# Patient Record
Sex: Female | Born: 1955
Health system: Southern US, Community
[De-identification: ages and names within clinical notes are randomized; demographics above are authoritative.]

## PROBLEM LIST (undated history)

## (undated) DIAGNOSIS — G4733 Obstructive sleep apnea (adult) (pediatric): Secondary | ICD-10-CM

## (undated) DIAGNOSIS — N183 Chronic kidney disease, stage 3 unspecified: Secondary | ICD-10-CM

## (undated) DIAGNOSIS — I509 Heart failure, unspecified: Secondary | ICD-10-CM

## (undated) DIAGNOSIS — I10 Essential (primary) hypertension: Secondary | ICD-10-CM

## (undated) DIAGNOSIS — R9389 Abnormal findings on diagnostic imaging of other specified body structures: Secondary | ICD-10-CM

## (undated) DIAGNOSIS — N289 Disorder of kidney and ureter, unspecified: Secondary | ICD-10-CM

## (undated) DIAGNOSIS — G030 Nonpyogenic meningitis: Secondary | ICD-10-CM

## (undated) DIAGNOSIS — Z992 Dependence on renal dialysis: Secondary | ICD-10-CM

## (undated) DIAGNOSIS — I2510 Atherosclerotic heart disease of native coronary artery without angina pectoris: Secondary | ICD-10-CM

## (undated) DIAGNOSIS — R601 Generalized edema: Secondary | ICD-10-CM

## (undated) DIAGNOSIS — I50810 Right heart failure, unspecified: Secondary | ICD-10-CM

## (undated) DIAGNOSIS — J449 Chronic obstructive pulmonary disease, unspecified: Secondary | ICD-10-CM

## (undated) DIAGNOSIS — R569 Unspecified convulsions: Secondary | ICD-10-CM

## (undated) DIAGNOSIS — E119 Type 2 diabetes mellitus without complications: Secondary | ICD-10-CM

## (undated) DIAGNOSIS — I272 Pulmonary hypertension, unspecified: Secondary | ICD-10-CM

## (undated) DIAGNOSIS — I639 Cerebral infarction, unspecified: Secondary | ICD-10-CM

## (undated) HISTORY — PX: TUBAL LIGATION: SHX77

## (undated) HISTORY — DX: Dependence on renal dialysis: Z99.2

## (undated) HISTORY — PX: ABDOMINAL HYSTERECTOMY: SHX81

## (undated) HISTORY — DX: Unspecified convulsions: R56.9

---

## 2012-04-05 ENCOUNTER — Emergency Department (HOSPITAL_COMMUNITY): Payer: Medicaid - Out of State

## 2012-04-05 ENCOUNTER — Encounter (HOSPITAL_COMMUNITY): Payer: Self-pay | Admitting: Emergency Medicine

## 2012-04-05 ENCOUNTER — Observation Stay (HOSPITAL_COMMUNITY)
Admission: EM | Admit: 2012-04-05 | Discharge: 2012-04-06 | Disposition: A | Discharge status: Home / Self Care | Payer: Medicaid - Out of State | Attending: Internal Medicine | Admitting: Internal Medicine

## 2012-04-05 DIAGNOSIS — R609 Edema, unspecified: Secondary | ICD-10-CM

## 2012-04-05 DIAGNOSIS — I2510 Atherosclerotic heart disease of native coronary artery without angina pectoris: Secondary | ICD-10-CM | POA: Insufficient documentation

## 2012-04-05 DIAGNOSIS — I509 Heart failure, unspecified: Secondary | ICD-10-CM

## 2012-04-05 DIAGNOSIS — D638 Anemia in other chronic diseases classified elsewhere: Secondary | ICD-10-CM

## 2012-04-05 DIAGNOSIS — D649 Anemia, unspecified: Secondary | ICD-10-CM

## 2012-04-05 DIAGNOSIS — Z7982 Long term (current) use of aspirin: Secondary | ICD-10-CM | POA: Insufficient documentation

## 2012-04-05 DIAGNOSIS — N179 Acute kidney failure, unspecified: Secondary | ICD-10-CM

## 2012-04-05 DIAGNOSIS — R0602 Shortness of breath: Secondary | ICD-10-CM

## 2012-04-05 DIAGNOSIS — I219 Acute myocardial infarction, unspecified: Secondary | ICD-10-CM | POA: Insufficient documentation

## 2012-04-05 DIAGNOSIS — R600 Localized edema: Secondary | ICD-10-CM

## 2012-04-05 DIAGNOSIS — Z794 Long term (current) use of insulin: Secondary | ICD-10-CM | POA: Insufficient documentation

## 2012-04-05 DIAGNOSIS — E119 Type 2 diabetes mellitus without complications: Secondary | ICD-10-CM

## 2012-04-05 DIAGNOSIS — I5023 Acute on chronic systolic (congestive) heart failure: Principal | ICD-10-CM | POA: Insufficient documentation

## 2012-04-05 DIAGNOSIS — Z79899 Other long term (current) drug therapy: Secondary | ICD-10-CM | POA: Insufficient documentation

## 2012-04-05 HISTORY — DX: Atherosclerotic heart disease of native coronary artery without angina pectoris: I25.10

## 2012-04-05 HISTORY — DX: Heart failure, unspecified: I50.9

## 2012-04-05 HISTORY — DX: Essential (primary) hypertension: I10

## 2012-04-05 HISTORY — DX: Chronic obstructive pulmonary disease, unspecified: J44.9

## 2012-04-05 LAB — TROPONIN I: Troponin I: <0.3 ng/mL (ref <0.30)

## 2012-04-05 LAB — GLUCOSE, CAPILLARY: Glucose-Capillary: 277 mg/dL — ABNORMAL HIGH (ref 70–99)

## 2012-04-05 LAB — COMPREHENSIVE METABOLIC PANEL
ALT: 13 U/L (ref 0–35)
Albumin: 2.9 g/dL — ABNORMAL LOW (ref 3.5–5.2)
Alkaline Phosphatase: 112 U/L (ref 39–117)
Glucose, Bld: 383 mg/dL — ABNORMAL HIGH (ref 70–99)
Potassium: 4.3 mEq/L (ref 3.5–5.1)
Sodium: 137 mEq/L (ref 135–145)
Total Protein: 6.6 g/dL (ref 6.0–8.3)

## 2012-04-05 LAB — CBC
Hemoglobin: 7.6 g/dL — ABNORMAL LOW (ref 12.0–15.0)
MCHC: 32.3 g/dL (ref 30.0–36.0)
RDW: 14.5 % (ref 11.5–15.5)
WBC: 5.9 10*3/uL (ref 4.0–10.5)

## 2012-04-05 LAB — PREPARE RBC (CROSSMATCH)

## 2012-04-05 LAB — PRO B NATRIURETIC PEPTIDE: Pro B Natriuretic peptide (BNP): 4578 pg/mL — ABNORMAL HIGH (ref 0–125)

## 2012-04-05 LAB — OCCULT BLOOD, POC DEVICE: Fecal Occult Bld: NEGATIVE

## 2012-04-05 MED ORDER — FERROUS FUMARATE 325 (106 FE) MG PO TABS
1.0000 | ORAL_TABLET | Freq: Every day | ORAL | Status: DC
  Administered 2012-04-05 – 2012-04-06 (×2): 106 mg via ORAL
  Filled 2012-04-05 (×2): qty 1

## 2012-04-05 MED ORDER — ISOSORBIDE MONONITRATE ER 60 MG PO TB24
120.0000 mg | ORAL_TABLET | Freq: Every day | ORAL | Status: DC
  Administered 2012-04-05 – 2012-04-06 (×2): 120 mg via ORAL
  Filled 2012-04-05 (×3): qty 2

## 2012-04-05 MED ORDER — ONDANSETRON HCL 4 MG PO TABS: 4.0000 mg | ORAL_TABLET | Freq: Four times a day (QID) | ORAL | Status: DC | PRN

## 2012-04-05 MED ORDER — ENOXAPARIN SODIUM 40 MG/0.4ML ~~LOC~~ SOLN
40.0000 mg | SUBCUTANEOUS | Status: DC
  Administered 2012-04-05: 40 mg via SUBCUTANEOUS
  Filled 2012-04-05 (×2): qty 0.4

## 2012-04-05 MED ORDER — INSULIN GLARGINE 100 UNIT/ML ~~LOC~~ SOLN
15.0000 [IU] | Freq: Every day | SUBCUTANEOUS | Status: DC
  Administered 2012-04-05: 15 [IU] via SUBCUTANEOUS

## 2012-04-05 MED ORDER — HYDRALAZINE HCL 50 MG PO TABS
100.0000 mg | ORAL_TABLET | Freq: Three times a day (TID) | ORAL | Status: DC
Start: 2012-04-05 — End: 2012-04-06
  Administered 2012-04-05 – 2012-04-06 (×3): 100 mg via ORAL
  Filled 2012-04-05 (×7): qty 2

## 2012-04-05 MED ORDER — SODIUM CHLORIDE 0.9 % IJ SOLN
3.0000 mL | Freq: Two times a day (BID) | INTRAMUSCULAR | Status: DC
  Administered 2012-04-05: 3 mL via INTRAVENOUS

## 2012-04-05 MED ORDER — CLOPIDOGREL BISULFATE 75 MG PO TABS
75.0000 mg | ORAL_TABLET | Freq: Every day | ORAL | Status: DC
  Administered 2012-04-06: 75 mg via ORAL
  Filled 2012-04-05 (×2): qty 1

## 2012-04-05 MED ORDER — ASPIRIN EC 81 MG PO TBEC
81.0000 mg | DELAYED_RELEASE_TABLET | Freq: Every day | ORAL | Status: DC
  Administered 2012-04-06: 81 mg via ORAL
  Filled 2012-04-05 (×2): qty 1

## 2012-04-05 MED ORDER — ACETAMINOPHEN 650 MG RE SUPP: 650.0000 mg | Freq: Four times a day (QID) | RECTAL | Status: DC | PRN

## 2012-04-05 MED ORDER — NITROGLYCERIN 0.4 MG SL SUBL: 0.4000 mg | SUBLINGUAL_TABLET | SUBLINGUAL | Status: DC | PRN

## 2012-04-05 MED ORDER — SODIUM CHLORIDE 0.9 % IJ SOLN
3.0000 mL | INTRAMUSCULAR | Status: DC | PRN
  Filled 2012-04-05: qty 9

## 2012-04-05 MED ORDER — ACETAMINOPHEN 325 MG PO TABS
650.0000 mg | ORAL_TABLET | Freq: Four times a day (QID) | ORAL | Status: DC | PRN
  Administered 2012-04-06: 650 mg via ORAL
  Filled 2012-04-05: qty 2

## 2012-04-05 MED ORDER — SODIUM CHLORIDE 0.9 % IV SOLN: 250.0000 mL | INTRAVENOUS | Status: DC | PRN

## 2012-04-05 MED ORDER — OXYCODONE HCL 5 MG PO TABS: 5.0000 mg | ORAL_TABLET | ORAL | Status: DC | PRN

## 2012-04-05 MED ORDER — SODIUM CHLORIDE 0.9 % IJ SOLN
3.0000 mL | Freq: Two times a day (BID) | INTRAMUSCULAR | Status: DC
  Administered 2012-04-05 – 2012-04-06 (×2): 3 mL via INTRAVENOUS

## 2012-04-05 MED ORDER — NITROGLYCERIN 2 % TD OINT
1.0000 [in_us] | TOPICAL_OINTMENT | Freq: Four times a day (QID) | TRANSDERMAL | Status: DC
  Administered 2012-04-05: 1 [in_us] via TOPICAL
  Filled 2012-04-05: qty 1

## 2012-04-05 MED ORDER — SPIRONOLACTONE 25 MG PO TABS
25.0000 mg | ORAL_TABLET | Freq: Every day | ORAL | Status: DC
  Administered 2012-04-05 – 2012-04-06 (×2): 25 mg via ORAL
  Filled 2012-04-05 (×2): qty 1

## 2012-04-05 MED ORDER — ATORVASTATIN CALCIUM 40 MG PO TABS
40.0000 mg | ORAL_TABLET | Freq: Every day | ORAL | Status: DC
  Administered 2012-04-05: 40 mg via ORAL
  Filled 2012-04-05 (×2): qty 1

## 2012-04-05 MED ORDER — GABAPENTIN 300 MG PO CAPS
300.0000 mg | ORAL_CAPSULE | Freq: Two times a day (BID) | ORAL | Status: DC
  Administered 2012-04-05 – 2012-04-06 (×2): 300 mg via ORAL
  Filled 2012-04-05 (×3): qty 1

## 2012-04-05 MED ORDER — FAMOTIDINE 10 MG PO TABS
10.0000 mg | ORAL_TABLET | Freq: Two times a day (BID) | ORAL | Status: DC
  Administered 2012-04-05 – 2012-04-06 (×2): 10 mg via ORAL
  Filled 2012-04-05 (×3): qty 1

## 2012-04-05 MED ORDER — METOPROLOL TARTRATE 25 MG PO TABS
75.0000 mg | ORAL_TABLET | Freq: Two times a day (BID) | ORAL | Status: DC
  Administered 2012-04-05 – 2012-04-06 (×2): 75 mg via ORAL
  Filled 2012-04-05 (×4): qty 1

## 2012-04-05 MED ORDER — INSULIN ASPART 100 UNIT/ML ~~LOC~~ SOLN: 4.0000 [IU] | Freq: Three times a day (TID) | SUBCUTANEOUS | Status: DC

## 2012-04-05 MED ORDER — ALBUTEROL SULFATE HFA 108 (90 BASE) MCG/ACT IN AERS
2.0000 | INHALATION_SPRAY | Freq: Four times a day (QID) | RESPIRATORY_TRACT | Status: DC | PRN
  Filled 2012-04-05: qty 6.7

## 2012-04-05 MED ORDER — SODIUM CHLORIDE 0.9 % IJ SOLN
3.0000 mL | Freq: Two times a day (BID) | INTRAMUSCULAR | Status: DC
  Filled 2012-04-05: qty 3

## 2012-04-05 MED ORDER — ONDANSETRON HCL 4 MG/2ML IJ SOLN: 4.0000 mg | Freq: Four times a day (QID) | INTRAMUSCULAR | Status: DC | PRN

## 2012-04-05 MED ORDER — DOCUSATE SODIUM 100 MG PO CAPS
100.0000 mg | ORAL_CAPSULE | Freq: Every day | ORAL | Status: DC
  Administered 2012-04-05 – 2012-04-06 (×2): 100 mg via ORAL
  Filled 2012-04-05 (×2): qty 1

## 2012-04-05 MED ORDER — ASPIRIN 81 MG PO CHEW
324.0000 mg | CHEWABLE_TABLET | Freq: Once | ORAL | Status: AC
  Administered 2012-04-05: 324 mg via ORAL
  Filled 2012-04-05 (×2): qty 4

## 2012-04-05 MED ORDER — FUROSEMIDE 10 MG/ML IJ SOLN
40.0000 mg | Freq: Two times a day (BID) | INTRAMUSCULAR | Status: DC
  Administered 2012-04-05 – 2012-04-06 (×2): 40 mg via INTRAVENOUS
  Filled 2012-04-05 (×3): qty 4

## 2012-04-05 MED ORDER — SODIUM CHLORIDE 0.9 % IJ SOLN: 3.0000 mL | INTRAMUSCULAR | Status: DC | PRN

## 2012-04-05 MED ORDER — INSULIN ASPART 100 UNIT/ML ~~LOC~~ SOLN: 0.0000 [IU] | Freq: Three times a day (TID) | SUBCUTANEOUS | Status: DC

## 2012-04-05 MED ORDER — FUROSEMIDE 10 MG/ML IJ SOLN: 40.0000 mg | Freq: Once | INTRAMUSCULAR | Status: DC

## 2012-04-05 NOTE — ED Notes (Signed)
Pt c/o increased SOB and fluid retention x 1 month that was worse this am; pt sts tightness in abd; pt sts cough and sts 6lb weight gain; pt taking diuretic but sts decrease in urine output

## 2012-04-05 NOTE — ED Notes (Signed)
Pt to XRAY

## 2012-04-05 NOTE — ED Provider Notes (Signed)
History    CSN: [id number] Arrival date & time 04/05/12  1141 First MD Initiated Contact with Patient 04/05/12 1151    Chief Complaint  Patient presents with  . Shortness of Breath  . Leg Swelling    HPI The patient presents to the emergency room with complaints of chest pain and shortness of breath. This has been ongoing for at least the last month or 2 and has been gradually progressing. She has had a recent complicated history of being hospitalized in [place] for issues with congestive heart failure. The patient apparently was hospitalized but had renal insufficiency for this reason they were unable to do a cardiac catheterization. The patient has been having trouble with epigastric substernal discomfort associated with shortness of breath whenever she exerts herself. This has been progressively getting worse. She has been following up with her doctors at this past week and she came to visit her family here in [place] for the holidays. The daughter has noted that her symptoms have progressed. She is deemed approximately 6 pounds over these last few days. Her legs and abdomen feel more swollen. She might have a slight decrease in her urine output but otherwise is still urinating. Past Medical History  Diagnosis Date  . CHF (congestive heart failure)   . COPD (chronic obstructive pulmonary disease)   . Coronary artery disease   . Hypertension   . Diabetes mellitus without complication   . MI (myocardial infarction)     History reviewed. No pertinent past surgical history.  History reviewed. No pertinent family history.  History  Substance Use Topics  . Smoking status: Never Smoker   . Smokeless tobacco: Not on file  . Alcohol Use: No    OB History    Grav Para Term Preterm Abortions TAB SAB Ect Mult Living                  Review of Systems  All other systems reviewed and are negative.    Allergies  Review of patient's allergies indicates no known  allergies.  Home Medications  No current outpatient prescriptions on file.  BP 208/81  Pulse 63  Temp 98.8 F (37.1 C) (Oral)  Resp 18  SpO2 99%  Physical Exam  Nursing note and vitals reviewed. Constitutional: She appears well-developed and well-nourished. No distress.  HENT:  Head: Normocephalic and atraumatic.  Right Ear: External ear normal.  Left Ear: External ear normal.  Eyes: Conjunctivae normal are normal. Right eye exhibits no discharge. Left eye exhibits no discharge. No scleral icterus.  Neck: Neck supple. No tracheal deviation present.  Cardiovascular: Normal rate, regular rhythm and intact distal pulses.   Pulmonary/Chest: Effort normal. No stridor. No respiratory distress. She has decreased breath sounds in the right lower field. She has no wheezes. She has rales.  Abdominal: Soft. Bowel sounds are normal. She exhibits no distension. There is no tenderness. There is no rebound and no guarding.       Protuberant abdomen, nontender  Musculoskeletal: She exhibits edema ( bilateral lower extremities with tense pitting edema). She exhibits no tenderness.  Neurological: She is alert. She has normal strength. No sensory deficit. Cranial nerve deficit:  no gross defecits noted. She exhibits normal muscle tone. She displays no seizure activity. Coordination normal.  Skin: Skin is warm and dry. No rash noted.  Psychiatric: She has a normal mood and affect.    ED Course  Procedures (including critical care time)  Rate: 64  Rhythm: normal sinus  rhythm  QRS Axis: Right  Intervals: normal  ST/T Wave abnormalities: Borderline T-wave changes Conduction Disutrbances:none  Narrative Interpretation: Abnormal  Old EKG Reviewed: none available CRITICAL CARE Performed by: Linwood Dibbles R Total critical care time: 35 Critical care time was exclusive of separately billable procedures and treating other patients. Critical care was necessary to treat or prevent imminent or  life-threatening deterioration. Critical care was time spent personally by me on the following activities: development of treatment plan with patient and/or surrogate as well as nursing, discussions with consultants, evaluation of patient's response to treatment, examination of patient, obtaining history from patient or surrogate, ordering and performing treatments and interventions, ordering and review of laboratory studies, ordering and review of radiographic studies, pulse oximetry and re-evaluation of patient's condition.  Labs Reviewed  CBC - Abnormal; Notable for the following:    RBC 2.71 (*)     Hemoglobin 7.6 (*)     HCT 23.5 (*)     Platelets 123 (*)     All other components within normal limits  COMPREHENSIVE METABOLIC PANEL - Abnormal; Notable for the following:    Glucose, Bld 383 (*)     BUN 34 (*)     Creatinine, Ser 1.16 (*)     Albumin 2.9 (*)     GFR calc non Af Amer 52 (*)     GFR calc Af Amer 61 (*)     All other components within normal limits  PRO B NATRIURETIC PEPTIDE - Abnormal; Notable for the following:    Pro B Natriuretic peptide (BNP) 4578.0 (*)     All other components within normal limits  TROPONIN I  MAGNESIUM  PROTIME-INR  OCCULT BLOOD, POC DEVICE  PREPARE RBC (CROSSMATCH)  TYPE AND SCREEN   Dg Chest 2 View  04/05/2012  *RADIOLOGY REPORT*  Clinical Data: Shortness of breath, leg swelling  CHEST - 2 VIEW  Comparison: None.  Findings: Cardiomegaly is noted.  No focal infiltrate or pulmonary edema.  Minimal interstitial prominence.  Atherosclerotic calcifications of thoracic aorta.  Bony thorax is unremarkable. No pleural effusion.  IMPRESSION: Cardiomegaly.  No focal infiltrate or convincing pulmonary edema. Atherosclerotic calcifications of thoracic aorta.   Original Report Authenticated By: Natasha Mead, M.D.      1. Anemia   2. CHF (congestive heart failure)       MDM  Suspect her symptoms are multifactorial however she does not have significant  pulmonary edema on CXR.  Her anemia certainly is severe enough that it may be contributing.  Doubt active GI bleed.  Plan will be for IV blood transfusion, admission and further evaluation.        Celene Kras, MD 04/05/12 [phone]

## 2012-04-05 NOTE — H&P (Addendum)
[facility]          History and Physical    PCP:   Pcp Not In System   Chief Complaint:  SOB and leg swelling.  HPI: 56 y/o woman from [facility] who is here visiting her daughter for thanksgiving presents to the ED for evaluation of the above mentioned complaints. In Sept she had an MI and per report had a prolonged hospital stay in [place] with ARF (Cr as high as 7) requiring CVVHD. They could never complete her cath because of ARF. Ever since her DC she continues to have SOB with exertion. She came in today because her daughter was concerned about a 7 lb weight gain and massive lower extremity edema. Her BNP is in the 4000 range but CXR shows no pulmonary edema. She does not have SOB at rest, only with exertion and this has not changed from her baseline. She has a Hb of 7.6. She called her cardiologist in [facility] who advised her to come to the closest ED. We have been asked to admit her for further evaluation and management.  Allergies:  No Known Allergies    Past Medical History  Diagnosis Date  . CHF (congestive heart failure)   . COPD (chronic obstructive pulmonary disease)   . Coronary artery disease   . Hypertension   . Diabetes mellitus without complication   . MI (myocardial infarction)     History reviewed. No pertinent past surgical history.  Prior to Admission medications   Medication Sig Start Date End Date Taking? Authorizing Provider  albuterol (PROVENTIL HFA;VENTOLIN HFA) 108 (90 BASE) MCG/ACT inhaler Inhale 2 puffs into the lungs every 6 (six) hours as needed. Shortness of breath   Yes Historical Provider, MD  aspirin EC 81 MG tablet Take 81 mg by mouth daily.   Yes Historical Provider, MD  atorvastatin (LIPITOR) 40 MG tablet Take 40 mg by mouth at bedtime.   Yes Historical Provider, MD  clopidogrel (PLAVIX) 75 MG tablet Take 75 mg by mouth daily.   Yes Historical Provider, MD  docusate sodium (COLACE) 100 MG capsule Take 100 mg by mouth daily.   Yes  Historical Provider, MD  ferrous fumarate (HEMOCYTE - 106 MG FE) 325 (106 FE) MG TABS Take 1 tablet by mouth daily.   Yes Historical Provider, MD  furosemide (LASIX) 40 MG tablet Take 40 mg by mouth daily.   Yes Historical Provider, MD  gabapentin (NEURONTIN) 300 MG capsule Take 300 mg by mouth 2 (two) times daily.   Yes Historical Provider, MD  hydrALAZINE (APRESOLINE) 100 MG tablet Take 100 mg by mouth 3 (three) times daily.   Yes Historical Provider, MD  insulin glargine (LANTUS) 100 UNIT/ML injection Inject 15 Units into the skin at bedtime.   Yes Historical Provider, MD  insulin lispro (HUMALOG) 100 UNIT/ML injection Inject into the skin 3 (three) times daily before meals. 3 units when cbg at 200.   Yes Historical Provider, MD  isosorbide mononitrate (IMDUR) 120 MG 24 hr tablet Take 120 mg by mouth daily.   Yes Historical Provider, MD  metoprolol (LOPRESSOR) 50 MG tablet Take 75 mg by mouth 2 (two) times daily.   Yes Historical Provider, MD  nitroGLYCERIN (NITROSTAT) 0.4 MG SL tablet Place 0.4 mg under the tongue every 5 (five) minutes as needed. Chest pain   Yes Historical Provider, MD  ranitidine (ZANTAC) 150 MG tablet Take 150 mg by mouth 2 (two) times daily.   Yes Historical Provider, MD  spironolactone (ALDACTONE) 25 MG tablet Take 25 mg by mouth daily.   Yes Historical Provider, MD    Social History:  reports that she has never smoked. She does not have any smokeless tobacco history on file. She reports that she does not drink alcohol or use illicit drugs.  History reviewed. No pertinent family history.  Review of Systems:  Constitutional: Denies fever, chills, diaphoresis, appetite change and fatigue.  HEENT: Denies photophobia, eye pain, redness, hearing loss, ear pain, congestion, sore throat, rhinorrhea, sneezing, mouth sores, trouble swallowing, neck pain, neck stiffness and tinnitus.   Respiratory: Denies  cough, chest tightness,  and wheezing.   Cardiovascular: Denies  palpitations . Gastrointestinal: Denies nausea, vomiting, abdominal pain, diarrhea, constipation, blood in stool and abdominal distention.  Genitourinary: Denies dysuria, urgency, frequency, hematuria, flank pain and difficulty urinating.  Musculoskeletal: Denies myalgias, back pain, joint swelling, arthralgias and gait problem.  Skin: Denies pallor, rash and wound.  Neurological: Denies dizziness, seizures, syncope, weakness, light-headedness, numbness and headaches.  Hematological: Denies adenopathy. Easy bruising, personal or family bleeding history  Psychiatric/Behavioral: Denies suicidal ideation, mood changes, confusion, nervousness, sleep disturbance and agitation   Physical Exam: Blood pressure 172/69, pulse 60, temperature 98.7 F (37.1 C), temperature source Oral, resp. rate 18, height 4\' 9"  (1.448 m), weight 91.853 kg (202 lb 8 oz), SpO2 98.00%. Gen: AA Ox3, NAD, flat affect. HEENT: [place]/AT/PERRL/EOMI Neck: supple, no LAD, no bruits, no goiter. CV: RRR, +SEM Lungs: CTA B Abd: S/NT/+BS?subcutaneous tissue is tight with edema. Ext: 3+ pitting edema bilaterally, +pedal pulses. Neuro: grossly intact and non focal.  Labs on Admission:  Results for orders placed during the hospital encounter of 04/05/12 (from the past 48 hour(s))  TROPONIN I     Status: Normal   Collection Time   04/05/12 12:17 PM      Component Value Range Comment   Troponin I <0.30  <0.30 ng/mL   CBC     Status: Abnormal   Collection Time   04/05/12 12:18 PM      Component Value Range Comment   WBC 5.9  4.0 - 10.5 K/uL    RBC 2.71 (*) 3.87 - 5.11 MIL/uL    Hemoglobin 7.6 (*) 12.0 - 15.0 g/dL    HCT 16.1 (*) 09.6 - 46.0 %    MCV 86.7  78.0 - 100.0 fL    MCH 28.0  26.0 - 34.0 pg    MCHC 32.3  30.0 - 36.0 g/dL    RDW 04.5  40.9 - 81.1 %    Platelets 123 (*) 150 - 400 K/uL   COMPREHENSIVE METABOLIC PANEL     Status: Abnormal   Collection Time   04/05/12 12:18 PM      Component Value Range Comment    Sodium 137  135 - 145 mEq/L    Potassium 4.3  3.5 - 5.1 mEq/L    Chloride 104  96 - 112 mEq/L    CO2 23  19 - 32 mEq/L    Glucose, Bld 383 (*) 70 - 99 mg/dL    BUN 34 (*) 6 - 23 mg/dL    Creatinine, Ser 9.14 (*) 0.50 - 1.10 mg/dL    Calcium 9.2  8.4 - 78.2 mg/dL    Total Protein 6.6  6.0 - 8.3 g/dL    Albumin 2.9 (*) 3.5 - 5.2 g/dL    AST 25  0 - 37 U/L    ALT 13  0 - 35 U/L    Alkaline Phosphatase  112  39 - 117 U/L    Total Bilirubin 0.4  0.3 - 1.2 mg/dL    GFR calc non Af Amer 52 (*) >90 mL/min    GFR calc Af Amer 61 (*) >90 mL/min   MAGNESIUM     Status: Normal   Collection Time   04/05/12 12:18 PM      Component Value Range Comment   Magnesium 2.2  1.5 - 2.5 mg/dL   PROTIME-INR     Status: Normal   Collection Time   04/05/12 12:18 PM      Component Value Range Comment   Prothrombin Time 14.2  11.6 - 15.2 seconds    INR 1.11  0.00 - 1.49   PRO B NATRIURETIC PEPTIDE     Status: Abnormal   Collection Time   04/05/12  1:07 PM      Component Value Range Comment   Pro B Natriuretic peptide (BNP) 4578.0 (*) 0 - 125 pg/mL   OCCULT BLOOD, POC DEVICE     Status: Normal   Collection Time   04/05/12  2:25 PM      Component Value Range Comment   Fecal Occult Bld NEGATIVE     PREPARE RBC (CROSSMATCH)     Status: Normal   Collection Time   04/05/12  3:40 PM      Component Value Range Comment   Order Confirmation ORDER PROCESSED BY BLOOD BANK     TYPE AND SCREEN     Status: Normal (Preliminary result)   Collection Time   04/05/12  3:40 PM      Component Value Range Comment   ABO/RH(D) O POS      Antibody Screen PENDING      Sample Expiration 04/08/2012       Radiological Exams on Admission: Dg Chest 2 View  04/05/2012  *RADIOLOGY REPORT*  Clinical Data: Shortness of breath, leg swelling  CHEST - 2 VIEW  Comparison: None.  Findings: Cardiomegaly is noted.  No focal infiltrate or pulmonary edema.  Minimal interstitial prominence.  Atherosclerotic calcifications of thoracic  aorta.  Bony thorax is unremarkable. No pleural effusion.  IMPRESSION: Cardiomegaly.  No focal infiltrate or convincing pulmonary edema. Atherosclerotic calcifications of thoracic aorta.   Original Report Authenticated By: Natasha Mead, M.D.     Assessment/Plan Principal Problem:  *SOB (shortness of breath) Active Problems:  Leg edema  CHF (congestive heart failure)  ARF (acute renal failure)  Anemia, chronic disease  DM (diabetes mellitus)   SOB -Suspect this is related to her CAD/CHF, probably compounded by her anemia. -Her SOB pattern has not changed so do not suspect unstable angina. -Have discussed in length with patient and daughter. -We have decided that it does not make sense to keep her in hospital a long time to redo testing that has already been done in [facility]. -They have plans to return home on Friday. -We will admit her for observation for blood transfusion and diuresis. Will also cycle troponins. -She would like to be discharged tomorrow if possible. -Will not consult cardiology unless she were to rule in. -She has a follow up scheduled with her cardiologist for next week. -It may be reasonable to DC her on a higher dose of lasix until seen by her cardiologist next week as they can closely follow her ARF.  Acute CHF, type unknown -The meds she is on make me believe she has systolic dysfunction. -As discussed above, will not repeat ECHO here. -Lasix 40 IV BID. -Strict Is and  Os and aim for negative fluid balance. -Keep on home meds: BB, ASA, statin, imdur, hydralazine, spironolactone. No ACE-I/ARB given renal failure.  DM II -Start SSI and keep on home dose of basal.  Anemia -Likely of chronic disease. -Will transfuse 2 units of PRBCs which will hopefully help with her SOB.  DVT Prophylaxis -Lovenox.    Time Spent on Admission: 75 minutes.  Chaya Jan [facility] Pager: [phone] 04/05/2012, 4:38 PM

## 2012-04-05 NOTE — ED Notes (Signed)
Pt back from XRAY 

## 2012-04-05 NOTE — ED Notes (Signed)
Pt states baseline weight is 165 lb. Today's actual is 202.5 lb

## 2012-04-05 NOTE — ED Notes (Signed)
AIDET performed. 

## 2012-04-06 ENCOUNTER — Encounter (HOSPITAL_COMMUNITY): Payer: Self-pay

## 2012-04-06 LAB — TYPE AND SCREEN
ABO/RH(D): O POS
Unit division: 0

## 2012-04-06 LAB — BASIC METABOLIC PANEL
BUN: 34 mg/dL — ABNORMAL HIGH (ref 6–23)
CO2: 24 mEq/L (ref 19–32)
Calcium: 9.1 mg/dL (ref 8.4–10.5)
GFR calc non Af Amer: 50 mL/min — ABNORMAL LOW (ref >90)
Glucose, Bld: 278 mg/dL — ABNORMAL HIGH (ref 70–99)

## 2012-04-06 LAB — CBC
Hemoglobin: 9 g/dL — ABNORMAL LOW (ref 12.0–15.0)
MCH: 28.2 pg (ref 26.0–34.0)
MCHC: 32.6 g/dL (ref 30.0–36.0)
MCV: 86.5 fL (ref 78.0–100.0)
Platelets: 123 10*3/uL — ABNORMAL LOW (ref 150–400)

## 2012-04-06 LAB — GLUCOSE, CAPILLARY: Glucose-Capillary: 235 mg/dL — ABNORMAL HIGH (ref 70–99)

## 2012-04-06 LAB — TROPONIN I
Troponin I: <0.3 ng/mL (ref <0.30)
Troponin I: <0.3 ng/mL (ref <0.30)

## 2012-04-06 MED ORDER — INSULIN ASPART 100 UNIT/ML ~~LOC~~ SOLN: 0.0000 [IU] | Freq: Every day | SUBCUTANEOUS | Status: DC

## 2012-04-06 MED ORDER — INSULIN ASPART 100 UNIT/ML ~~LOC~~ SOLN
4.0000 [IU] | Freq: Three times a day (TID) | SUBCUTANEOUS | Status: DC
  Administered 2012-04-06: 4 [IU] via SUBCUTANEOUS

## 2012-04-06 MED ORDER — INSULIN ASPART 100 UNIT/ML ~~LOC~~ SOLN: 0.0000 [IU] | Freq: Three times a day (TID) | SUBCUTANEOUS | Status: DC

## 2012-04-06 MED ORDER — INSULIN ASPART 100 UNIT/ML ~~LOC~~ SOLN
0.0000 [IU] | Freq: Three times a day (TID) | SUBCUTANEOUS | Status: DC
  Administered 2012-04-06: 5 [IU] via SUBCUTANEOUS
  Administered 2012-04-06: 3 [IU] via SUBCUTANEOUS

## 2012-04-06 MED ORDER — FUROSEMIDE 10 MG/ML IJ SOLN
40.0000 mg | Freq: Once | INTRAMUSCULAR | Status: AC
  Administered 2012-04-06: 40 mg via INTRAVENOUS

## 2012-04-06 MED ORDER — FUROSEMIDE 40 MG PO TABS: 40.0000 mg | ORAL_TABLET | Freq: Two times a day (BID) | ORAL | Status: DC

## 2012-04-06 MED ORDER — METOLAZONE 2.5 MG PO TABS
2.5000 mg | ORAL_TABLET | Freq: Once | ORAL | Status: AC
  Administered 2012-04-06: 2.5 mg via ORAL
  Filled 2012-04-06: qty 1

## 2012-04-06 NOTE — Progress Notes (Signed)
Utilization review completed.  P.J. Ovid Witman,RN,BSN Case Manager [phone]  

## 2012-04-06 NOTE — Progress Notes (Signed)
Subia MD. PT bp 208/79 cbg 235. MD request to give 10am BP med at 800 and add insulin slide scale.   Will continue to monitor

## 2012-04-06 NOTE — Progress Notes (Signed)
[facility] PROGRESS NOTE  Madeline [name] NFA:[id number] DOB: [birth year] DOA: 04/05/2012 PCP: Pcp Not In System  Assessment/Plan: Acute on chronic CHF, likely systolic -Family wishes to defer repeat workup at this time since patient recently had echocardiogram in [place] -Continue furosemide IV -Add metazolone -Part of her dyspnea do to anemia, transfused 2 units -neg 687cc in less than 24hr, continue strict I.'s and Os -Continue home oxygen at 2 L Diabetes mellitus type 2 -Continue home dose of Lantus with NovoLog sliding scale -Again family does not want to repeat entire workup at this time Anemia -Transfused 2 units PRBCs -Likely anemia of chronic disease Coronary artery disease -Continue aspirin and Plavix  Principal Problem:  *SOB (shortness of breath) Active Problems:  Leg edema  CHF (congestive heart failure)  ARF (acute renal failure)  Anemia, chronic disease  DM (diabetes mellitus)     Disposition Plan:   Home when medically stable     Procedures/Studies: Dg Chest 2 View  04/05/2012  *RADIOLOGY REPORT*  Clinical Data: Shortness of breath, leg swelling  CHEST - 2 VIEW  Comparison: None.  Findings: Cardiomegaly is noted.  No focal infiltrate or pulmonary edema.  Minimal interstitial prominence.  Atherosclerotic calcifications of thoracic aorta.  Bony thorax is unremarkable. No pleural effusion.  IMPRESSION: Cardiomegaly.  No focal infiltrate or convincing pulmonary edema. Atherosclerotic calcifications of thoracic aorta.   Original Report Authenticated By: Natasha Mead, M.D.          Subjective: Patient is breathing a little bit better, but states that she is "wheezing". Denies any chest pain, nausea, vomiting, diarrhea, abdominal pain. She ate 100% of her breakfast. No dysuria or hematuria.  Objective: Filed Vitals:   04/06/12 0100 04/06/12 0200 04/06/12 0633 04/06/12 0758  BP: 136/61 149/59 208/76 208/83  Pulse: 57 56 54 58  Temp: 99 F (37.2  C) 98.3 F (36.8 C) 98.3 F (36.8 C)   TempSrc: Oral Oral Oral   Resp: 18 16 20    Height:      Weight:   92.3 kg (203 lb 7.8 oz)   SpO2:   100%     Intake/Output Summary (Last 24 hours) at 04/06/12 0855 Last data filed at 04/06/12 0505  Gross per 24 hour  Intake  262.5 ml  Output    950 ml  Net -687.5 ml   Weight change:  Exam:   General:  Pt is alert, follows commands appropriately, not in acute distress  HEENT: No icterus, No thrush,  [place]/AT  Cardiovascular: RRR, S1/S2, no rubs, no gallops  Respiratory: Bibasilar crackles, right greater than left  Abdomen: Soft/+BS, non tender, non distended, no guarding  Extremities: 1+ edema, No lymphangitis, No petechiae, No rashes, no synovitis  Data Reviewed: Basic Metabolic Panel:  Lab 04/06/12 4696 04/05/12 1218  NA 141 137  K 4.2 4.3  CL 107 104  CO2 24 23  GLUCOSE 278* 383*  BUN 34* 34*  CREATININE 1.21* 1.16*  CALCIUM 9.1 9.2  MG -- 2.2  PHOS -- --   Liver Function Tests:  Lab 04/05/12 1218  AST 25  ALT 13  ALKPHOS 112  BILITOT 0.4  PROT 6.6  ALBUMIN 2.9*   No results found for this basename: LIPASE:5,AMYLASE:5 in the last 168 hours No results found for this basename: AMMONIA:5 in the last 168 hours CBC:  Lab 04/06/12 0430 04/05/12 1218  WBC 7.3 5.9  NEUTROABS -- --  HGB 9.0* 7.6*  HCT 27.6* 23.5*  MCV 86.5 86.7  PLT  123* 123*   Cardiac Enzymes:  Lab 04/06/12 0430 04/05/12 1217  CKTOTAL -- --  CKMB -- --  CKMBINDEX -- --  TROPONINI <0.30 <0.30   BNP: No components found with this basename: POCBNP:5 CBG:  Lab 04/06/12 0622 04/05/12 2149 04/05/12 1730  GLUCAP 235* 318* 277*    No results found for this or any previous visit (from the past 240 hour(s)).   Scheduled Meds:   . [COMPLETED] aspirin  324 mg Oral Once  . aspirin EC  81 mg Oral Daily  . atorvastatin  40 mg Oral QHS  . clopidogrel  75 mg Oral Daily  . docusate sodium  100 mg Oral Daily  . enoxaparin (LOVENOX) injection   40 mg Subcutaneous Q24H  . famotidine  10 mg Oral BID  . ferrous fumarate  1 tablet Oral Daily  . furosemide  40 mg Intravenous Q12H  . gabapentin  300 mg Oral BID  . hydrALAZINE  100 mg Oral TID  . insulin aspart  0-15 Units Subcutaneous TID WC  . insulin aspart  0-5 Units Subcutaneous QHS  . insulin aspart  4 Units Subcutaneous TID WC  . insulin glargine  15 Units Subcutaneous QHS  . isosorbide mononitrate  120 mg Oral Daily  . metolazone  2.5 mg Oral Once  . metoprolol  75 mg Oral BID  . sodium chloride  3 mL Intravenous Q12H  . sodium chloride  3 mL Intravenous Q12H  . spironolactone  25 mg Oral Daily  . [DISCONTINUED] furosemide  40 mg Intravenous Once  . [DISCONTINUED] insulin aspart  0-15 Units Subcutaneous TID WC  . [DISCONTINUED] insulin aspart  0-15 Units Subcutaneous TID WC  . [DISCONTINUED] insulin aspart  4 Units Subcutaneous TID WC  . [DISCONTINUED] nitroGLYCERIN  1 inch Topical Q6H  . [DISCONTINUED] sodium chloride  3 mL Intravenous Q12H   Continuous Infusions:    Madeline Pettengill, DO  [facility] Pager [phone]  If 7PM-7AM, please contact night-coverage [email or web address] Password [email or web address] 04/06/2012, 8:55 AM   LOS: 1 day

## 2012-04-06 NOTE — Progress Notes (Signed)
Physician Discharge Summary  Madeline [name] AVW:[id number] DOB: 01/07/1956 DOA: 04/05/2012  PCP: Pcp Not In System  Admit date: 04/05/2012 Discharge date: 04/06/2012  Recommendations for Outpatient Follow-up:  1. Pt will need to follow up with PCP in 1 weeks post discharge 2. Follow up with cardiology in [facility] in 1 week  Discharge Diagnoses:  Principal Problem:  *SOB (shortness of breath) Active Problems:  Leg edema  CHF (congestive heart failure)  ARF (acute renal failure)  Anemia, chronic disease  DM (diabetes mellitus) Acute on chronic CHF, likely systolic  -Family wishes to defer repeat workup at this time since patient recently had echocardiogram in [place]  -Continue furosemide IV  -Add metazolone  -Part of her dyspnea do to anemia, transfused 2 units  -neg 687cc in less than 24hr, continue strict I.'s and Os  -Continue home oxygen at 2 L  Diabetes mellitus type 2  -Continue home dose of Lantus with NovoLog sliding scale  -Again family does not want to repeat entire workup at this time  Anemia  -Transfused 2 units PRBCs  -Likely anemia of chronic disease  Coronary artery disease  -Continue aspirin and Plavix   Discharge Condition: stable  Disposition: d/c home  Diet: cardiac Wt Readings from Last 3 Encounters:  04/06/12 92.3 kg (203 lb 7.8 oz)    History of present illness:  56 y/o woman from [facility] who is here visiting her daughter for thanksgiving presents to the ED for evaluation of the above mentioned complaints. In Sept she had an MI and per report had a prolonged hospital stay in [place] with ARF (Cr as high as 7) requiring CVVHD. They could never complete her cath because of ARF. Ever since her DC she continues to have SOB with exertion. She came in 11/26 because her daughter was concerned about a 7 lb weight gain and massive lower extremity edema. Her BNP is in the 4000 range. CXR showed mild interstitial edema. She does not have SOB at rest, only with  exertion and this has not changed from her baseline. She has a Hb of 7.6. She called her cardiologist in [facility] who advised her to come to the closest ED. We have been asked to admit her for further evaluation and management   Hospital Course:  The patient was started on intravenous furosemide. The patient did not complain of any chest pain. Her troponins were cycled and were negative. EKG did not suggest acute coronary syndrome. Hemoccult was negative. The patient was transfused 2 units packed red blood cells. Her hemoglobin improved to 9.0 on the day of discharge. A long discussion was undertaken with the patient's daughter who is an Charity fundraiser and well versed in the medical field. The patient's daughter did not want the patient's entire CHF workup to be repeated since it was just recently done in [place]. She suggested that patient did have some systolic heart failure. However, she did not recall the ejection fraction. The patient had improved by the afternoon on the day of discharge. I voiced to the daughter that the patient may benefit from an additional day in the hospital for continued diuresis. However, the patient's daughter voiced her desire to take the patient home if at all possible for the Thanksgiving holiday. She told me that her plan was to drive the patient back to [place] on Friday for her continued medical care. After long discussion, it was agreed upon that the patient would receive an additional dose of furosemide prior to discharge  this afternoon. The patient diuresed and at 800 cc of fluid this morning with symptom improvement. Prior to discharge, the patient was given additional dose of IV furosemide 40 mg. She will be sent home with furosemide 40 mg twice a day as opposed to her normal 40 mg daily dose. During the hospitalization the patient also received a dose of Zaroxolyn which also aided in the diuresis. The patient's creatinine remained stable on the day of discharge chart 1.21.  The patient will be discharged on her home dose of Lantus and NovoLog female. The patient's daughter understands to bring the patient back to the emergency department immediately should the patient complained of worsening shortness of breath chest discomfort or dizziness. She was instructed to take the patient to follow up with her cardiologist in one week. She was to make the patient already has a followup appointment with her cardiologist in [place] next week.   Discharge Exam: Filed Vitals:   04/06/12 1013  BP: 175/71  Pulse: 55  Temp:   Resp: 19   Filed Vitals:   04/06/12 0200 04/06/12 0633 04/06/12 0758 04/06/12 1013  BP: 149/59 208/76 208/83 175/71  Pulse: 56 54 58 55  Temp: 98.3 F (36.8 C) 98.3 F (36.8 C)    TempSrc: Oral Oral    Resp: 16 20  19   Height:      Weight:  92.3 kg (203 lb 7.8 oz)    SpO2:  100%  100%   General: A&O x 3, NAD, pleasant, cooperative Cardiovascular: RRR, no rub, no gallop, no S3 Respiratory: Bibasilar crackles. No wheezes or rhonchi. Good air movement. Abdomen:soft, nontender, nondistended, positive bowel sounds Extremities: 2+ edema, No lymphangitis, no petechiae  Discharge Instructions  Discharge Orders    Future Orders Please Complete By Expires   Diet - low sodium heart healthy      Increase activity slowly      Discharge instructions      Comments:   Continue lasix 40mg  twice a day until follow up with cardiology next week in [place]       Medication List     As of 04/06/2012  7:12 PM    TAKE these medications         albuterol 108 (90 BASE) MCG/ACT inhaler   Commonly known as: PROVENTIL HFA;VENTOLIN HFA   Inhale 2 puffs into the lungs every 6 (six) hours as needed. Shortness of breath      aspirin EC 81 MG tablet   Take 81 mg by mouth daily.      atorvastatin 40 MG tablet   Commonly known as: LIPITOR   Take 40 mg by mouth at bedtime.      clopidogrel 75 MG tablet   Commonly known as: PLAVIX   Take 75 mg  by mouth daily.      docusate sodium 100 MG capsule   Commonly known as: COLACE   Take 100 mg by mouth daily.      ferrous fumarate 325 (106 FE) MG Tabs   Commonly known as: HEMOCYTE - 106 mg FE   Take 1 tablet by mouth daily.      furosemide 40 MG tablet   Commonly known as: LASIX   Take 1 tablet (40 mg total) by mouth 2 (two) times daily.      gabapentin 300 MG capsule   Commonly known as: NEURONTIN   Take 300 mg by mouth 2 (two) times daily.      hydrALAZINE 100 MG  tablet   Commonly known as: APRESOLINE   Take 100 mg by mouth 3 (three) times daily.      insulin glargine 100 UNIT/ML injection   Commonly known as: LANTUS   Inject 15 Units into the skin at bedtime.      insulin lispro 100 UNIT/ML injection   Commonly known as: HUMALOG   Inject into the skin 3 (three) times daily before meals. 3 units when cbg at 200.      isosorbide mononitrate 120 MG 24 hr tablet   Commonly known as: IMDUR   Take 120 mg by mouth daily.      metoprolol 50 MG tablet   Commonly known as: LOPRESSOR   Take 75 mg by mouth 2 (two) times daily.      nitroGLYCERIN 0.4 MG SL tablet   Commonly known as: NITROSTAT   Place 0.4 mg under the tongue every 5 (five) minutes as needed. Chest pain      ranitidine 150 MG tablet   Commonly known as: ZANTAC   Take 150 mg by mouth 2 (two) times daily.      spironolactone 25 MG tablet   Commonly known as: ALDACTONE   Take 25 mg by mouth daily.         The results of significant diagnostics from this hospitalization (including imaging, microbiology, ancillary and laboratory) are listed below for reference.    Significant Diagnostic Studies: Dg Chest 2 View  04/05/2012  *RADIOLOGY REPORT*  Clinical Data: Shortness of breath, leg swelling  CHEST - 2 VIEW  Comparison: None.  Findings: Cardiomegaly is noted.  No focal infiltrate or pulmonary edema.  Minimal interstitial prominence.  Atherosclerotic calcifications of thoracic aorta.  Bony thorax is  unremarkable. No pleural effusion.  IMPRESSION: Cardiomegaly.  No focal infiltrate or convincing pulmonary edema. Atherosclerotic calcifications of thoracic aorta.   Original Report Authenticated By: Natasha Mead, M.D.      Microbiology: No results found for this or any previous visit (from the past 240 hour(s)).   Labs: Basic Metabolic Panel:  Lab 04/06/12 9604 04/05/12 1218  NA 141 137  K 4.2 4.3  CL 107 104  CO2 24 23  GLUCOSE 278* 383*  BUN 34* 34*  CREATININE 1.21* 1.16*  CALCIUM 9.1 9.2  MG -- 2.2  PHOS -- --   Liver Function Tests:  Lab 04/05/12 1218  AST 25  ALT 13  ALKPHOS 112  BILITOT 0.4  PROT 6.6  ALBUMIN 2.9*   No results found for this basename: LIPASE:5,AMYLASE:5 in the last 168 hours No results found for this basename: AMMONIA:5 in the last 168 hours CBC:  Lab 04/06/12 0430 04/05/12 1218  WBC 7.3 5.9  NEUTROABS -- --  HGB 9.0* 7.6*  HCT 27.6* 23.5*  MCV 86.5 86.7  PLT 123* 123*   Cardiac Enzymes:  Lab 04/06/12 0944 04/06/12 0430 04/05/12 1217  CKTOTAL -- -- --  CKMB -- -- --  CKMBINDEX -- -- --  TROPONINI <0.30 <0.30 <0.30   BNP: No components found with this basename: POCBNP:5 CBG:  Lab 04/06/12 1059 04/06/12 0622 04/05/12 2149 04/05/12 1730  GLUCAP 162* 235* 318* 277*    Time coordinating discharge:  Greater than 30 minutes  Signed:  Rikki Smestad, DO [facility] Pager: [phone] 04/06/2012, 7:12 PM

## 2012-04-06 NOTE — Progress Notes (Signed)
Pt d/c to home with family. D/c instructions and medications review with Pt. Pt states understanding. All Pt questions answered

## 2012-04-08 NOTE — Discharge Summary (Signed)
Physician Discharge Summary  Madeline [name] ZOX:[id number] DOB: 1955/07/24 DOA: 04/05/2012  PCP: Pcp Not In System  Admit date: 04/05/2012 Discharge date: 04/08/2012  Recommendations for Outpatient Follow-up:  1. Pt will need to follow up with PCP in 1 weeks post discharge 2. Follow up with cardiology in [facility] in 1 week  Discharge Diagnoses:  Principal Problem:  *SOB (shortness of breath) Active Problems:  Leg edema  CHF (congestive heart failure)  ARF (acute renal failure)  Anemia, chronic disease  DM (diabetes mellitus) Acute on chronic CHF, likely systolic  -Family wishes to defer repeat workup at this time since patient recently had echocardiogram in [place]  -Continue furosemide IV  -Add metazolone  -Part of her dyspnea do to anemia, transfused 2 units  -neg 687cc in less than 24hr, continue strict I.'s and Os  -Continue home oxygen at 2 L  Diabetes mellitus type 2  -Continue home dose of Lantus with NovoLog sliding scale  -Again family does not want to repeat entire workup at this time  Anemia  -Transfused 2 units PRBCs  -Likely anemia of chronic disease  Coronary artery disease  -Continue aspirin and Plavix   Discharge Condition: stable  Disposition: d/c home  Diet: cardiac Wt Readings from Last 3 Encounters:  04/06/12 92.3 kg (203 lb 7.8 oz)    History of present illness:  56 y/o woman from [facility] who is here visiting her daughter for thanksgiving presents to the ED for evaluation of the above mentioned complaints. In Sept she had an MI and per report had a prolonged hospital stay in [place] with ARF (Cr as high as 7) requiring CVVHD. They could never complete her cath because of ARF. Ever since her DC she continues to have SOB with exertion. She came in 11/26 because her daughter was concerned about a 7 lb weight gain and massive lower extremity edema. Her BNP is in the 4000 range. CXR showed mild interstitial edema. She does not have SOB at rest, only with  exertion and this has not changed from her baseline. She has a Hb of 7.6. She called her cardiologist in [facility] who advised her to come to the closest ED. We have been asked to admit her for further evaluation and management   Hospital Course:  The patient was started on intravenous furosemide. The patient did not complain of any chest pain. Her troponins were cycled and were negative. EKG did not suggest acute coronary syndrome. Hemoccult was negative. The patient was transfused 2 units packed red blood cells. Her hemoglobin improved to 9.0 on the day of discharge. A long discussion was undertaken with the patient's daughter who is an Charity fundraiser and well versed in the medical field. The patient's daughter did not want the patient's entire CHF workup to be repeated since it was just recently done in [place]. She suggested that patient did have some systolic heart failure. However, she did not recall the ejection fraction. The patient had improved by the afternoon on the day of discharge. I voiced to the daughter that the patient may benefit from an additional day in the hospital for continued diuresis. However, the patient's daughter voiced her desire to take the patient home if at all possible for the Thanksgiving holiday. She told me that her plan was to drive the patient back to [place] on Friday for her continued medical care. After long discussion, it was agreed upon that the patient would receive an additional dose of furosemide prior to discharge  this afternoon. The patient diuresed and at 800 cc of fluid this morning with symptom improvement. Prior to discharge, the patient was given additional dose of IV furosemide 40 mg. She will be sent home with furosemide 40 mg twice a day as opposed to her normal 40 mg daily dose. During the hospitalization the patient also received a dose of Zaroxolyn which also aided in the diuresis. The patient's creatinine remained stable on the day of discharge chart 1.21.  The patient will be discharged on her home dose of Lantus and NovoLog female. The patient's daughter understands to bring the patient back to the emergency department immediately should the patient complained of worsening shortness of breath chest discomfort or dizziness. She was instructed to take the patient to follow up with her cardiologist in one week. She was to make the patient already has a followup appointment with her cardiologist in [place] next week.   Discharge Exam: Filed Vitals:   04/06/12 1013  BP: 175/71  Pulse: 55  Temp:   Resp: 19   Filed Vitals:   04/06/12 0200 04/06/12 0633 04/06/12 0758 04/06/12 1013  BP: 149/59 208/76 208/83 175/71  Pulse: 56 54 58 55  Temp: 98.3 F (36.8 C) 98.3 F (36.8 C)    TempSrc: Oral Oral    Resp: 16 20  19   Height:      Weight:  92.3 kg (203 lb 7.8 oz)    SpO2:  100%  100%   General: A&O x 3, NAD, pleasant, cooperative Cardiovascular: RRR, no rub, no gallop, no S3 Respiratory: Bibasilar crackles. No wheezes or rhonchi. Good air movement. Abdomen:soft, nontender, nondistended, positive bowel sounds Extremities: 2+ edema, No lymphangitis, no petechiae  Discharge Instructions      Discharge Orders    Future Orders Please Complete By Expires   Diet - low sodium heart healthy      Increase activity slowly      Discharge instructions      Comments:   Continue lasix 40mg  twice a day until follow up with cardiology next week in [place]       Medication List     As of 04/08/2012  6:21 PM    TAKE these medications         albuterol 108 (90 BASE) MCG/ACT inhaler   Commonly known as: PROVENTIL HFA;VENTOLIN HFA   Inhale 2 puffs into the lungs every 6 (six) hours as needed. Shortness of breath      aspirin EC 81 MG tablet   Take 81 mg by mouth daily.      atorvastatin 40 MG tablet   Commonly known as: LIPITOR   Take 40 mg by mouth at bedtime.      clopidogrel 75 MG tablet   Commonly known as: PLAVIX   Take  75 mg by mouth daily.      docusate sodium 100 MG capsule   Commonly known as: COLACE   Take 100 mg by mouth daily.      ferrous fumarate 325 (106 FE) MG Tabs   Commonly known as: HEMOCYTE - 106 mg FE   Take 1 tablet by mouth daily.      furosemide 40 MG tablet   Commonly known as: LASIX   Take 1 tablet (40 mg total) by mouth 2 (two) times daily.      gabapentin 300 MG capsule   Commonly known as: NEURONTIN   Take 300 mg by mouth 2 (two) times daily.  hydrALAZINE 100 MG tablet   Commonly known as: APRESOLINE   Take 100 mg by mouth 3 (three) times daily.      insulin glargine 100 UNIT/ML injection   Commonly known as: LANTUS   Inject 15 Units into the skin at bedtime.      insulin lispro 100 UNIT/ML injection   Commonly known as: HUMALOG   Inject into the skin 3 (three) times daily before meals. 3 units when cbg at 200.      isosorbide mononitrate 120 MG 24 hr tablet   Commonly known as: IMDUR   Take 120 mg by mouth daily.      metoprolol 50 MG tablet   Commonly known as: LOPRESSOR   Take 75 mg by mouth 2 (two) times daily.      nitroGLYCERIN 0.4 MG SL tablet   Commonly known as: NITROSTAT   Place 0.4 mg under the tongue every 5 (five) minutes as needed. Chest pain      ranitidine 150 MG tablet   Commonly known as: ZANTAC   Take 150 mg by mouth 2 (two) times daily.      spironolactone 25 MG tablet   Commonly known as: ALDACTONE   Take 25 mg by mouth daily.           The results of significant diagnostics from this hospitalization (including imaging, microbiology, ancillary and laboratory) are listed below for reference.    Significant Diagnostic Studies: Dg Chest 2 View  04/05/2012  *RADIOLOGY REPORT*  Clinical Data: Shortness of breath, leg swelling  CHEST - 2 VIEW  Comparison: None.  Findings: Cardiomegaly is noted.  No focal infiltrate or pulmonary edema.  Minimal interstitial prominence.  Atherosclerotic calcifications of thoracic aorta.  Bony  thorax is unremarkable. No pleural effusion.  IMPRESSION: Cardiomegaly.  No focal infiltrate or convincing pulmonary edema. Atherosclerotic calcifications of thoracic aorta.   Original Report Authenticated By: Natasha Mead, M.D.      Microbiology: No results found for this or any previous visit (from the past 240 hour(s)).   Labs: Basic Metabolic Panel:  Lab 04/06/12 1308 04/05/12 1218  NA 141 137  K 4.2 4.3  CL 107 104  CO2 24 23  GLUCOSE 278* 383*  BUN 34* 34*  CREATININE 1.21* 1.16*  CALCIUM 9.1 9.2  MG -- 2.2  PHOS -- --   Liver Function Tests:  Lab 04/05/12 1218  AST 25  ALT 13  ALKPHOS 112  BILITOT 0.4  PROT 6.6  ALBUMIN 2.9*   No results found for this basename: LIPASE:5,AMYLASE:5 in the last 168 hours No results found for this basename: AMMONIA:5 in the last 168 hours CBC:  Lab 04/06/12 0430 04/05/12 1218  WBC 7.3 5.9  NEUTROABS -- --  HGB 9.0* 7.6*  HCT 27.6* 23.5*  MCV 86.5 86.7  PLT 123* 123*   Cardiac Enzymes:  Lab 04/06/12 0944 04/06/12 0430 04/05/12 1217  CKTOTAL -- -- --  CKMB -- -- --  CKMBINDEX -- -- --  TROPONINI <0.30 <0.30 <0.30   BNP: No components found with this basename: POCBNP:5 CBG:  Lab 04/06/12 1059 04/06/12 0622 04/05/12 2149 04/05/12 1730  GLUCAP 162* 235* 318* 277*    Time coordinating discharge:  Greater than 30 minutes  Signed:  Gelsey Amyx, DO [facility] Pager: [phone] 04/08/2012, 6:21 PM

## 2012-05-11 DIAGNOSIS — I639 Cerebral infarction, unspecified: Secondary | ICD-10-CM

## 2012-05-11 HISTORY — DX: Cerebral infarction, unspecified: I63.9

## 2012-12-15 ENCOUNTER — Emergency Department (HOSPITAL_COMMUNITY)
Admission: EM | Admit: 2012-12-15 | Discharge: 2012-12-15 | Discharge status: Against Medical Advice | Payer: Medicaid - Out of State | Attending: Emergency Medicine | Admitting: Emergency Medicine

## 2012-12-15 ENCOUNTER — Encounter (HOSPITAL_COMMUNITY): Payer: Self-pay | Admitting: Emergency Medicine

## 2012-12-15 DIAGNOSIS — N189 Chronic kidney disease, unspecified: Secondary | ICD-10-CM | POA: Insufficient documentation

## 2012-12-15 DIAGNOSIS — Z7982 Long term (current) use of aspirin: Secondary | ICD-10-CM | POA: Insufficient documentation

## 2012-12-15 DIAGNOSIS — J4489 Other specified chronic obstructive pulmonary disease: Secondary | ICD-10-CM | POA: Insufficient documentation

## 2012-12-15 DIAGNOSIS — R110 Nausea: Secondary | ICD-10-CM | POA: Insufficient documentation

## 2012-12-15 DIAGNOSIS — Z8781 Personal history of (healed) traumatic fracture: Secondary | ICD-10-CM | POA: Insufficient documentation

## 2012-12-15 DIAGNOSIS — I10 Essential (primary) hypertension: Secondary | ICD-10-CM

## 2012-12-15 DIAGNOSIS — I252 Old myocardial infarction: Secondary | ICD-10-CM | POA: Insufficient documentation

## 2012-12-15 DIAGNOSIS — M7989 Other specified soft tissue disorders: Secondary | ICD-10-CM | POA: Insufficient documentation

## 2012-12-15 DIAGNOSIS — M549 Dorsalgia, unspecified: Secondary | ICD-10-CM | POA: Insufficient documentation

## 2012-12-15 DIAGNOSIS — I129 Hypertensive chronic kidney disease with stage 1 through stage 4 chronic kidney disease, or unspecified chronic kidney disease: Secondary | ICD-10-CM | POA: Insufficient documentation

## 2012-12-15 DIAGNOSIS — I2510 Atherosclerotic heart disease of native coronary artery without angina pectoris: Secondary | ICD-10-CM | POA: Insufficient documentation

## 2012-12-15 DIAGNOSIS — I208 Other forms of angina pectoris: Secondary | ICD-10-CM

## 2012-12-15 DIAGNOSIS — J449 Chronic obstructive pulmonary disease, unspecified: Secondary | ICD-10-CM | POA: Insufficient documentation

## 2012-12-15 DIAGNOSIS — I209 Angina pectoris, unspecified: Secondary | ICD-10-CM | POA: Insufficient documentation

## 2012-12-15 DIAGNOSIS — Z79899 Other long term (current) drug therapy: Secondary | ICD-10-CM | POA: Insufficient documentation

## 2012-12-15 DIAGNOSIS — I509 Heart failure, unspecified: Secondary | ICD-10-CM | POA: Insufficient documentation

## 2012-12-15 DIAGNOSIS — E119 Type 2 diabetes mellitus without complications: Secondary | ICD-10-CM | POA: Insufficient documentation

## 2012-12-15 DIAGNOSIS — R300 Dysuria: Secondary | ICD-10-CM | POA: Insufficient documentation

## 2012-12-15 LAB — CBC
HCT: 26.6 % — ABNORMAL LOW (ref 36.0–46.0)
MCHC: 33.8 g/dL (ref 30.0–36.0)
MCV: 88.1 fL (ref 78.0–100.0)
Platelets: 228 10*3/uL (ref 150–400)
RDW: 13.6 % (ref 11.5–15.5)

## 2012-12-15 LAB — GLUCOSE, CAPILLARY: Glucose-Capillary: 164 mg/dL — ABNORMAL HIGH (ref 70–99)

## 2012-12-15 LAB — COMPREHENSIVE METABOLIC PANEL
Albumin: 1.9 g/dL — ABNORMAL LOW (ref 3.5–5.2)
BUN: 28 mg/dL — ABNORMAL HIGH (ref 6–23)
Creatinine, Ser: 1.96 mg/dL — ABNORMAL HIGH (ref 0.50–1.10)
Potassium: 3.9 mEq/L (ref 3.5–5.1)
Total Protein: 6.1 g/dL (ref 6.0–8.3)

## 2012-12-15 LAB — POCT I-STAT TROPONIN I: Troponin i, poc: 0.03 ng/mL (ref 0.00–0.08)

## 2012-12-15 NOTE — ED Notes (Addendum)
Unable to locate pt at this time

## 2012-12-15 NOTE — ED Notes (Signed)
Cp nausea and sob x 1 week states pain is left side goes thru to back

## 2012-12-15 NOTE — ED Provider Notes (Signed)
CSN: [id number]     Arrival date & time 12/15/12  1203 History     First MD Initiated Contact with Patient 12/15/12 1353     Chief Complaint  Patient presents with  . Chest Pain   (Consider location/radiation/quality/duration/timing/severity/associated sxs/prior Treatment) HPI 57 year old female with a history of CHF, COPD, CAD, hypertension, diabetes, MI in November of 2013, recent CVA, CKD, presents with back pain, dysuria, one week as exertional chest pain.  The patient reports that she was sent by her PCP secondary to elevated blood pressures, and reports she went to see her in PCP secondary to a bad cramping pain located in her bilateral lower back. Also reports she has had dysuria for the past week, however notes her PCP checked her urine and did not see any signs of infection.  Denies vaginal bleeding or discharge. Also notes that she's had left-sided chest pain and shortness of breath with exertion for the past week. Notes that the pain dissipates when she's at rest, and denies any chest pain while at rest. The pain is located under her left breast and radiates to her back, and is moderate in nature, dull and at times sharp.  Patient denies any fevers, vomiting diarrhea, constipation she had some nausea.      Past Medical History  Diagnosis Date  . CHF (congestive heart failure)   . COPD (chronic obstructive pulmonary disease)   . Coronary artery disease   . Hypertension   . Diabetes mellitus without complication   . MI (myocardial infarction)   . Anginal pain   . Shortness of breath   . Sleep apnea   . Chronic kidney disease    Past Surgical History  Procedure Laterality Date  . Tubal ligation     No family history on file. History  Substance Use Topics  . Smoking status: Never Smoker   . Smokeless tobacco: Current User    Types: Snuff, Chew  . Alcohol Use: No   OB History   Grav Para Term Preterm Abortions TAB SAB Ect Mult Living                 Review of Systems   Constitutional: Negative for fever, chills and diaphoresis.  HENT: Negative for sore throat and neck pain.   Eyes: Negative for visual disturbance.  Respiratory: Negative for cough and shortness of breath.   Cardiovascular: Positive for chest pain (only with exertion, goes away with rest) and leg swelling (chronic, unchanged).  Gastrointestinal: Positive for nausea. Negative for vomiting, abdominal pain, diarrhea and blood in stool.  Genitourinary: Positive for dysuria. Negative for vaginal bleeding, vaginal discharge and difficulty urinating.  Musculoskeletal: Negative for back pain.  Skin: Negative for rash.  Neurological: Negative for syncope and headaches.    Allergies  Review of patient's allergies indicates no known allergies.  Home Medications   Current Outpatient Rx  Name  Route  Sig  Dispense  Refill  . albuterol (PROVENTIL HFA;VENTOLIN HFA) 108 (90 BASE) MCG/ACT inhaler   Inhalation   Inhale 2 puffs into the lungs every 6 (six) hours as needed. Shortness of breath         . aspirin EC 81 MG tablet   Oral   Take 81 mg by mouth daily.         . ferrous sulfate 325 (65 FE) MG tablet   Oral   Take 325 mg by mouth daily with breakfast.         . furosemide (LASIX) 40  MG tablet   Oral   Take 40 mg by mouth daily.         Marland Kitchen gabapentin (NEURONTIN) 300 MG capsule   Oral   Take 300 mg by mouth 2 (two) times daily.         . hydrALAZINE (APRESOLINE) 50 MG tablet   Oral   Take 50 mg by mouth 3 (three) times daily.         . insulin lispro (HUMALOG) 100 UNIT/ML injection   Subcutaneous   Inject 0-10 Units into the skin 3 (three) times daily before meals.          . isosorbide mononitrate (IMDUR) 120 MG 24 hr tablet   Oral   Take 120 mg by mouth daily.         Marland Kitchen NIFEdipine (PROCARDIA XL/ADALAT-CC) 60 MG 24 hr tablet   Oral   Take 60 mg by mouth daily.         . nitroGLYCERIN (NITROSTAT) 0.4 MG SL tablet   Sublingual   Place 0.4 mg under the  tongue every 5 (five) minutes as needed. Chest pain         . omeprazole (PRILOSEC) 20 MG capsule   Oral   Take 20 mg by mouth daily.         . traMADol (ULTRAM) 50 MG tablet   Oral   Take 50 mg by mouth 3 (three) times daily as needed for pain.          BP 198/83  Pulse 72  Temp(Src) 97.6 F (36.4 C)  Resp 16  SpO2 99% Physical Exam  Nursing note and vitals reviewed. Constitutional: She is oriented to person, place, and time. She appears well-developed and well-nourished. No distress.  HENT:  Head: Normocephalic and atraumatic.  Eyes: Conjunctivae and EOM are normal. Pupils are equal, round, and reactive to light.  Neck: Normal range of motion.  Cardiovascular: Normal rate, regular rhythm, normal heart sounds and intact distal pulses.  Exam reveals no gallop and no friction rub.   No murmur heard. Pulmonary/Chest: Effort normal and breath sounds normal. No respiratory distress. She has no wheezes. She has no rales.  Abdominal: Soft. She exhibits no distension. There is no tenderness. There is no guarding and no CVA tenderness.  Musculoskeletal: She exhibits edema (1+ bilaterally). She exhibits no tenderness.       Lumbar back: She exhibits tenderness (bilateral). She exhibits no bony tenderness.  Neurological: She is alert and oriented to person, place, and time.  Skin: Skin is warm and dry. No rash noted. She is not diaphoretic. No erythema.    ED Course   Procedures (including critical care time)  Labs Reviewed  CBC - Abnormal; Notable for the following:    RBC 3.02 (*)    Hemoglobin 9.0 (*)    HCT 26.6 (*)    All other components within normal limits  COMPREHENSIVE METABOLIC PANEL - Abnormal; Notable for the following:    Glucose, Bld 193 (*)    BUN 28 (*)    Creatinine, Ser 1.96 (*)    Albumin 1.9 (*)    Total Bilirubin 0.2 (*)    GFR calc non Af Amer 28 (*)    GFR calc Af Amer 32 (*)    All other components within normal limits  GLUCOSE, CAPILLARY -  Abnormal; Notable for the following:    Glucose-Capillary 164 (*)    All other components within normal limits  URINE CULTURE  POCT I-STAT  TROPONIN I   No results found. 1. Stable angina   2. Back pain   3. Dysuria   4. Hypertension     Date: 12/15/2012  Rate: 74  Rhythm: normal sinus rhythm  QRS Axis: right  Intervals: normal  ST/T Wave abnormalities: normal  Conduction Disutrbances:none  Narrative Interpretation:   Old EKG Reviewed: unchanged, low voltage, artifact   MDM  57 year old female with a history of CHF, COPD, CAD, hypertension, diabetes, MI in November of 2013, recent CVA, CKD, presents with back pain, dysuria, one week as exertional chest pain.  An EKG was evaluated by me and showed normal sinus rhythm without acute ST changes consistent with ischemia or pericarditis.  Troponin was negative.  Cr was increased slightly from prior with value 1.9.  Hgb at baseline 9.0.  Albumin decreased.  Plan was to obtain urine and CXR to evaluate for possibility of hypertensive urgency however patient left prior to complete evaluation.  Based on description of symptoms related to exertion, patient likely with stable angina.  Patient without red flags of back pain including no trauma, fever, numbness/weakness, bowel/bladder incontinence and without midline symptoms.  Patient without fever to indicate pyelonephritis and reported normal urine at PCP office. Attempted to call patient after she had left, however got voicemail message that was not clearly hers.  Care discussed with Dr. Jeraldine Loots.    Rhae Lerner, MD 12/15/12 1950  Rhae Lerner, MD 12/15/12 [phone]

## 2012-12-15 NOTE — ED Notes (Signed)
Pt's visitor asking if the patient can come back at a later time for her results.  Informed visitor to stay and talk to the RN first.

## 2012-12-17 NOTE — ED Provider Notes (Signed)
  I performed an evaluation of this patient  and discussed management with Dr. Dalene Seltzer.  I agree with the history, physical, assessment, and plan of care.  On my exam she was in no distress.      Elyse Jarvis, MD 12/17/12 1450

## 2013-02-14 ENCOUNTER — Inpatient Hospital Stay (HOSPITAL_COMMUNITY)
Admission: EM | Admit: 2013-02-14 | Discharge: 2013-03-05 | DRG: 264 | Disposition: A | Discharge status: Home Health | Payer: Medicaid Other | Attending: Internal Medicine | Admitting: Internal Medicine

## 2013-02-14 ENCOUNTER — Emergency Department (HOSPITAL_COMMUNITY): Payer: Medicaid Other

## 2013-02-14 ENCOUNTER — Encounter (HOSPITAL_COMMUNITY): Payer: Self-pay | Admitting: *Deleted

## 2013-02-14 DIAGNOSIS — I2789 Other specified pulmonary heart diseases: Secondary | ICD-10-CM | POA: Diagnosis present

## 2013-02-14 DIAGNOSIS — D509 Iron deficiency anemia, unspecified: Secondary | ICD-10-CM | POA: Diagnosis present

## 2013-02-14 DIAGNOSIS — J9602 Acute respiratory failure with hypercapnia: Secondary | ICD-10-CM | POA: Diagnosis present

## 2013-02-14 DIAGNOSIS — N185 Chronic kidney disease, stage 5: Secondary | ICD-10-CM | POA: Diagnosis present

## 2013-02-14 DIAGNOSIS — I5033 Acute on chronic diastolic (congestive) heart failure: Secondary | ICD-10-CM

## 2013-02-14 DIAGNOSIS — Z9981 Dependence on supplemental oxygen: Secondary | ICD-10-CM

## 2013-02-14 DIAGNOSIS — N183 Chronic kidney disease, stage 3 unspecified: Secondary | ICD-10-CM

## 2013-02-14 DIAGNOSIS — E662 Morbid (severe) obesity with alveolar hypoventilation: Secondary | ICD-10-CM | POA: Diagnosis present

## 2013-02-14 DIAGNOSIS — I5031 Acute diastolic (congestive) heart failure: Secondary | ICD-10-CM

## 2013-02-14 DIAGNOSIS — Z9119 Patient's noncompliance with other medical treatment and regimen: Secondary | ICD-10-CM

## 2013-02-14 DIAGNOSIS — N179 Acute kidney failure, unspecified: Secondary | ICD-10-CM

## 2013-02-14 DIAGNOSIS — D62 Acute posthemorrhagic anemia: Secondary | ICD-10-CM

## 2013-02-14 DIAGNOSIS — E875 Hyperkalemia: Secondary | ICD-10-CM

## 2013-02-14 DIAGNOSIS — I160 Hypertensive urgency: Secondary | ICD-10-CM

## 2013-02-14 DIAGNOSIS — I130 Hypertensive heart and chronic kidney disease with heart failure and stage 1 through stage 4 chronic kidney disease, or unspecified chronic kidney disease: Principal | ICD-10-CM | POA: Diagnosis present

## 2013-02-14 DIAGNOSIS — R0603 Acute respiratory distress: Secondary | ICD-10-CM

## 2013-02-14 DIAGNOSIS — E46 Unspecified protein-calorie malnutrition: Secondary | ICD-10-CM | POA: Diagnosis present

## 2013-02-14 DIAGNOSIS — I10 Essential (primary) hypertension: Secondary | ICD-10-CM | POA: Diagnosis present

## 2013-02-14 DIAGNOSIS — J810 Acute pulmonary edema: Secondary | ICD-10-CM

## 2013-02-14 DIAGNOSIS — J9600 Acute respiratory failure, unspecified whether with hypoxia or hypercapnia: Secondary | ICD-10-CM

## 2013-02-14 DIAGNOSIS — Z6841 Body Mass Index (BMI) 40.0 and over, adult: Secondary | ICD-10-CM

## 2013-02-14 DIAGNOSIS — N189 Chronic kidney disease, unspecified: Secondary | ICD-10-CM

## 2013-02-14 DIAGNOSIS — K449 Diaphragmatic hernia without obstruction or gangrene: Secondary | ICD-10-CM | POA: Diagnosis present

## 2013-02-14 DIAGNOSIS — J9620 Acute and chronic respiratory failure, unspecified whether with hypoxia or hypercapnia: Secondary | ICD-10-CM | POA: Diagnosis present

## 2013-02-14 DIAGNOSIS — J449 Chronic obstructive pulmonary disease, unspecified: Secondary | ICD-10-CM | POA: Diagnosis present

## 2013-02-14 DIAGNOSIS — J9601 Acute respiratory failure with hypoxia: Secondary | ICD-10-CM

## 2013-02-14 DIAGNOSIS — I509 Heart failure, unspecified: Secondary | ICD-10-CM

## 2013-02-14 DIAGNOSIS — E1165 Type 2 diabetes mellitus with hyperglycemia: Secondary | ICD-10-CM | POA: Insufficient documentation

## 2013-02-14 DIAGNOSIS — I252 Old myocardial infarction: Secondary | ICD-10-CM

## 2013-02-14 DIAGNOSIS — Z794 Long term (current) use of insulin: Secondary | ICD-10-CM

## 2013-02-14 DIAGNOSIS — K259 Gastric ulcer, unspecified as acute or chronic, without hemorrhage or perforation: Secondary | ICD-10-CM | POA: Diagnosis present

## 2013-02-14 DIAGNOSIS — E8809 Other disorders of plasma-protein metabolism, not elsewhere classified: Secondary | ICD-10-CM | POA: Diagnosis present

## 2013-02-14 DIAGNOSIS — IMO0001 Reserved for inherently not codable concepts without codable children: Secondary | ICD-10-CM | POA: Diagnosis present

## 2013-02-14 DIAGNOSIS — R739 Hyperglycemia, unspecified: Secondary | ICD-10-CM

## 2013-02-14 DIAGNOSIS — D649 Anemia, unspecified: Secondary | ICD-10-CM

## 2013-02-14 DIAGNOSIS — I50811 Acute right heart failure: Secondary | ICD-10-CM

## 2013-02-14 DIAGNOSIS — I2510 Atherosclerotic heart disease of native coronary artery without angina pectoris: Secondary | ICD-10-CM | POA: Diagnosis present

## 2013-02-14 DIAGNOSIS — N184 Chronic kidney disease, stage 4 (severe): Secondary | ICD-10-CM

## 2013-02-14 DIAGNOSIS — Z91199 Patient's noncompliance with other medical treatment and regimen due to unspecified reason: Secondary | ICD-10-CM

## 2013-02-14 DIAGNOSIS — IMO0002 Reserved for concepts with insufficient information to code with codable children: Secondary | ICD-10-CM

## 2013-02-14 DIAGNOSIS — Z87891 Personal history of nicotine dependence: Secondary | ICD-10-CM

## 2013-02-14 DIAGNOSIS — E119 Type 2 diabetes mellitus without complications: Secondary | ICD-10-CM | POA: Diagnosis present

## 2013-02-14 DIAGNOSIS — D638 Anemia in other chronic diseases classified elsewhere: Secondary | ICD-10-CM

## 2013-02-14 DIAGNOSIS — J4489 Other specified chronic obstructive pulmonary disease: Secondary | ICD-10-CM | POA: Diagnosis present

## 2013-02-14 DIAGNOSIS — N2581 Secondary hyperparathyroidism of renal origin: Secondary | ICD-10-CM | POA: Diagnosis present

## 2013-02-14 DIAGNOSIS — G4733 Obstructive sleep apnea (adult) (pediatric): Secondary | ICD-10-CM

## 2013-02-14 DIAGNOSIS — R601 Generalized edema: Secondary | ICD-10-CM | POA: Diagnosis present

## 2013-02-14 HISTORY — DX: Chronic kidney disease, stage 3 unspecified: N18.30

## 2013-02-14 HISTORY — DX: Generalized edema: R60.1

## 2013-02-14 HISTORY — DX: Nonpyogenic meningitis: G03.0

## 2013-02-14 HISTORY — DX: Right heart failure, unspecified: I50.810

## 2013-02-14 HISTORY — DX: Pulmonary hypertension, unspecified: I27.20

## 2013-02-14 HISTORY — DX: Obstructive sleep apnea (adult) (pediatric): G47.33

## 2013-02-14 HISTORY — DX: Type 2 diabetes mellitus without complications: E11.9

## 2013-02-14 HISTORY — DX: Abnormal findings on diagnostic imaging of other specified body structures: R93.89

## 2013-02-14 HISTORY — DX: Cerebral infarction, unspecified: I63.9

## 2013-02-14 HISTORY — DX: Chronic kidney disease, stage 3 (moderate): N18.3

## 2013-02-14 LAB — POCT I-STAT 3, ART BLOOD GAS (G3+)
Acid-Base Excess: 4 mmol/L — ABNORMAL HIGH (ref 0.0–2.0)
Bicarbonate: 26.8 mEq/L — ABNORMAL HIGH (ref 20.0–24.0)
Patient temperature: 98.6
pH, Arterial: 7.502 — ABNORMAL HIGH (ref 7.350–7.450)

## 2013-02-14 LAB — POCT I-STAT TROPONIN I: Troponin i, poc: 0 ng/mL (ref 0.00–0.08)

## 2013-02-14 LAB — COMPREHENSIVE METABOLIC PANEL
ALT: 17 U/L (ref 0–35)
Alkaline Phosphatase: 197 U/L — ABNORMAL HIGH (ref 39–117)
BUN: 32 mg/dL — ABNORMAL HIGH (ref 6–23)
CO2: 22 mEq/L (ref 19–32)
Calcium: 7.8 mg/dL — ABNORMAL LOW (ref 8.4–10.5)
GFR calc Af Amer: 23 mL/min — ABNORMAL LOW (ref >90)
GFR calc non Af Amer: 20 mL/min — ABNORMAL LOW (ref >90)
Glucose, Bld: 527 mg/dL — ABNORMAL HIGH (ref 70–99)
Potassium: 5.5 mEq/L — ABNORMAL HIGH (ref 3.5–5.1)
Total Protein: 6.3 g/dL (ref 6.0–8.3)

## 2013-02-14 LAB — CBC WITH DIFFERENTIAL/PLATELET
Basophils Absolute: 0 10*3/uL (ref 0.0–0.1)
Eosinophils Absolute: 0.1 10*3/uL (ref 0.0–0.7)
Eosinophils Relative: 1 % (ref 0–5)
Hemoglobin: 7 g/dL — ABNORMAL LOW (ref 12.0–15.0)
Monocytes Absolute: 0.6 10*3/uL (ref 0.1–1.0)
RBC: 2.4 MIL/uL — ABNORMAL LOW (ref 3.87–5.11)
WBC: 9.9 10*3/uL (ref 4.0–10.5)

## 2013-02-14 LAB — PRO B NATRIURETIC PEPTIDE: Pro B Natriuretic peptide (BNP): 7354 pg/mL — ABNORMAL HIGH (ref 0–125)

## 2013-02-14 LAB — POCT I-STAT 3, VENOUS BLOOD GAS (G3P V)
Acid-base deficit: 1 mmol/L (ref 0.0–2.0)
Bicarbonate: 23 mEq/L (ref 20.0–24.0)
pH, Ven: 7.454 — ABNORMAL HIGH (ref 7.250–7.300)

## 2013-02-14 LAB — OCCULT BLOOD, POC DEVICE: Fecal Occult Bld: POSITIVE — AB

## 2013-02-14 LAB — URINE MICROSCOPIC-ADD ON

## 2013-02-14 LAB — URINALYSIS, ROUTINE W REFLEX MICROSCOPIC
Glucose, UA: >1000 mg/dL — AB
Leukocytes, UA: NEGATIVE
Protein, ur: >300 mg/dL — AB
Specific Gravity, Urine: 1.02 (ref 1.005–1.030)
pH: 6.5 (ref 5.0–8.0)

## 2013-02-14 MED ORDER — GABAPENTIN 300 MG PO CAPS
300.0000 mg | ORAL_CAPSULE | Freq: Two times a day (BID) | ORAL | Status: DC
  Administered 2013-02-15 – 2013-02-20 (×11): 300 mg via ORAL
  Filled 2013-02-14 (×13): qty 1

## 2013-02-14 MED ORDER — FUROSEMIDE 10 MG/ML IJ SOLN
60.0000 mg | Freq: Once | INTRAMUSCULAR | Status: AC
  Administered 2013-02-14: 60 mg via INTRAVENOUS
  Filled 2013-02-14: qty 6

## 2013-02-14 MED ORDER — HYDRALAZINE HCL 50 MG PO TABS
50.0000 mg | ORAL_TABLET | Freq: Three times a day (TID) | ORAL | Status: DC
  Administered 2013-02-15 – 2013-02-16 (×7): 50 mg via ORAL
  Filled 2013-02-14 (×10): qty 1

## 2013-02-14 MED ORDER — PANTOPRAZOLE SODIUM 40 MG PO TBEC: 40.0000 mg | DELAYED_RELEASE_TABLET | Freq: Every day | ORAL | Status: DC

## 2013-02-14 MED ORDER — ONDANSETRON HCL 4 MG/2ML IJ SOLN
4.0000 mg | Freq: Four times a day (QID) | INTRAMUSCULAR | Status: DC | PRN
  Administered 2013-02-25: 4 mg via INTRAVENOUS
  Filled 2013-02-14: qty 2

## 2013-02-14 MED ORDER — ISOSORBIDE MONONITRATE ER 60 MG PO TB24
120.0000 mg | ORAL_TABLET | Freq: Every day | ORAL | Status: DC
  Administered 2013-02-15 – 2013-03-05 (×19): 120 mg via ORAL
  Filled 2013-02-14 (×19): qty 2

## 2013-02-14 MED ORDER — INSULIN ASPART 100 UNIT/ML ~~LOC~~ SOLN
0.0000 [IU] | Freq: Three times a day (TID) | SUBCUTANEOUS | Status: DC
  Administered 2013-02-15 (×2): 2 [IU] via SUBCUTANEOUS
  Administered 2013-02-16: 3 [IU] via SUBCUTANEOUS
  Administered 2013-02-16: 2 [IU] via SUBCUTANEOUS
  Administered 2013-02-17: 7 [IU] via SUBCUTANEOUS

## 2013-02-14 MED ORDER — MORPHINE SULFATE 2 MG/ML IJ SOLN
1.0000 mg | INTRAMUSCULAR | Status: DC | PRN
  Administered 2013-02-15 – 2013-02-26 (×7): 1 mg via INTRAVENOUS
  Filled 2013-02-14 (×7): qty 1

## 2013-02-14 MED ORDER — FERROUS SULFATE 325 (65 FE) MG PO TABS
325.0000 mg | ORAL_TABLET | Freq: Every day | ORAL | Status: DC
  Administered 2013-02-15 – 2013-02-22 (×7): 325 mg via ORAL
  Filled 2013-02-14 (×10): qty 1

## 2013-02-14 MED ORDER — ACETAMINOPHEN 650 MG RE SUPP: 650.0000 mg | Freq: Four times a day (QID) | RECTAL | Status: DC | PRN

## 2013-02-14 MED ORDER — NIFEDIPINE ER 60 MG PO TB24
60.0000 mg | ORAL_TABLET | Freq: Every day | ORAL | Status: DC
  Administered 2013-02-15 – 2013-02-25 (×11): 60 mg via ORAL
  Filled 2013-02-14 (×12): qty 1

## 2013-02-14 MED ORDER — ASPIRIN EC 81 MG PO TBEC
81.0000 mg | DELAYED_RELEASE_TABLET | Freq: Every day | ORAL | Status: DC
  Administered 2013-02-15 – 2013-03-05 (×18): 81 mg via ORAL
  Filled 2013-02-14 (×19): qty 1

## 2013-02-14 MED ORDER — SODIUM CHLORIDE 0.9 % IJ SOLN
3.0000 mL | Freq: Two times a day (BID) | INTRAMUSCULAR | Status: DC
  Administered 2013-02-17 – 2013-02-20 (×4): 3 mL via INTRAVENOUS

## 2013-02-14 MED ORDER — ASPIRIN EC 81 MG PO TBEC
81.0000 mg | DELAYED_RELEASE_TABLET | Freq: Every day | ORAL | Status: DC
  Filled 2013-02-14: qty 1

## 2013-02-14 MED ORDER — FUROSEMIDE 10 MG/ML IJ SOLN
60.0000 mg | Freq: Four times a day (QID) | INTRAMUSCULAR | Status: DC
  Administered 2013-02-15 (×2): 60 mg via INTRAVENOUS
  Filled 2013-02-14 (×4): qty 6

## 2013-02-14 MED ORDER — INSULIN ASPART 100 UNIT/ML ~~LOC~~ SOLN
10.0000 [IU] | Freq: Once | SUBCUTANEOUS | Status: AC
  Administered 2013-02-14: 10 [IU] via INTRAVENOUS
  Filled 2013-02-14: qty 1

## 2013-02-14 MED ORDER — ASPIRIN 81 MG PO CHEW
324.0000 mg | CHEWABLE_TABLET | Freq: Once | ORAL | Status: AC
  Administered 2013-02-14: 324 mg via ORAL
  Filled 2013-02-14: qty 4

## 2013-02-14 MED ORDER — SODIUM CHLORIDE 0.9 % IJ SOLN
3.0000 mL | Freq: Two times a day (BID) | INTRAMUSCULAR | Status: DC
  Administered 2013-02-15 – 2013-03-04 (×27): 3 mL via INTRAVENOUS

## 2013-02-14 MED ORDER — INSULIN GLARGINE 100 UNIT/ML ~~LOC~~ SOLN
5.0000 [IU] | Freq: Once | SUBCUTANEOUS | Status: AC
  Administered 2013-02-15: 5 [IU] via SUBCUTANEOUS
  Filled 2013-02-14: qty 0.05

## 2013-02-14 MED ORDER — NITROGLYCERIN IN D5W 200-5 MCG/ML-% IV SOLN: 2.0000 ug/min | INTRAVENOUS | Status: DC

## 2013-02-14 MED ORDER — ONDANSETRON HCL 4 MG PO TABS: 4.0000 mg | ORAL_TABLET | Freq: Four times a day (QID) | ORAL | Status: DC | PRN

## 2013-02-14 MED ORDER — ACETAMINOPHEN 325 MG PO TABS
650.0000 mg | ORAL_TABLET | Freq: Four times a day (QID) | ORAL | Status: DC | PRN
  Administered 2013-02-15: 650 mg via ORAL
  Filled 2013-02-14: qty 2

## 2013-02-14 MED ORDER — TRAMADOL HCL 50 MG PO TABS
50.0000 mg | ORAL_TABLET | Freq: Three times a day (TID) | ORAL | Status: DC | PRN
  Administered 2013-02-16 – 2013-02-27 (×7): 50 mg via ORAL
  Filled 2013-02-14 (×7): qty 1

## 2013-02-14 MED ORDER — NITROGLYCERIN IN D5W 200-5 MCG/ML-% IV SOLN
2.0000 ug/min | Freq: Once | INTRAVENOUS | Status: AC
  Administered 2013-02-14: 5 ug/min via INTRAVENOUS
  Filled 2013-02-14: qty 250

## 2013-02-14 NOTE — ED Notes (Signed)
Pt arrived via [facility] on cpap machine and ems reported they gave nitro for elevated blood pressure related to chf.  Pt was sob and tachypneic. Cpap removed on arrival and sats 100/2.5 L

## 2013-02-14 NOTE — ED Provider Notes (Signed)
The patient is a 57 year old female who has a history of congestive heart failure, recent admission to the hospital for acute renal failure, acute on chronic congestive heart failure and received 2 units of packed red blood cells after she was found to be anemic which was thought to be related to chronic anemia. Chronic disease. She presents because of acute shortness of breath which is severe, nature extremely dyspneic and required BiPAP intervention by paramedics prior to arrival. She was hypoxic, she is on home oxygen at 2 L nasal cannula. On my exam the patient has JVD to the angle of the jaw, tachypneic, speaking in 2-3 word sentences, diffuse peripheral edema of both the bilateral lower and upper extremities, this is pitting edema of the entire arms and legs. She has no pain in the upper abdomen. She also states that she's been having hematemesis in the morning and abnormal stools. The patient does appear acutely ill, she is severely hypertensive and with her history of congestive heart failure I would lean towards CHF as the etiology of the patient's symptoms. We'll place her on a nitroglycerin drip, provide Lasix, cardiac monitoring, oxygen, chest x-ray, labs. The patient is critically ill and will need admission to the hospital to a high level of care.  The patient had a nitroglycerin drip for her severe hypertension in the setting of CHF as well as Lasix for diuresis.  Insulin given for hyperkalemia and hyperglycemia.  I saw and evaluated the patient, reviewed the resident's note and I agree with the findings and plan.  Diagnosis  #1 congestive heart failure #2 acute respiratory distress #3 acute on chronic renal failure #4 GI bleed #5 anemia #6 hyperglycemia #7 severe hypertension   CRITICAL CARE Performed by: Vida Roller Total critical care time: 35 Critical care time was exclusive of separately billable procedures and treating other patients. Critical care was necessary to treat  or prevent imminent or life-threatening deterioration. Critical care was time spent personally by me on the following activities: development of treatment plan with patient and/or surrogate as well as nursing, discussions with consultants, evaluation of patient's response to treatment, examination of patient, obtaining history from patient or surrogate, ordering and performing treatments and interventions, ordering and review of laboratory studies, ordering and review of radiographic studies, pulse oximetry and re-evaluation of patient's condition.  Procedures supervised during this encounter - I directly supervised the insertion of peripheral IV by the resident physician.  I personally interpreted the EKG as well as the resident and agree with the interpretation on the resident's chart.   Vida Roller, MD 02/14/13 [phone]

## 2013-02-14 NOTE — H&P (Signed)
[facility] History and Physical  Madeline [name] ZOX:[id number] DOB: 10-29-1955 DOA: 02/14/2013  Referring physician: ER physician. PCP: Pcp Not In System   Chief Complaint: Shortness of breath.  HPI: Madeline [name] is a 57 y.o. female history of CHF, CAD, chronic kidney disease, diabetes mellitus2, chronic anemia was brought to the ER patient was found to be acutely short of breath. The patient was found to have systolic blood pressure more than 200. Chest x-ray showed features consistent with possible pulmonary edema. As per the family patient was doing fine until this evening when patient became suddenly short of breath. Patient did not have any chest pain fever chills or any productive cough. Patient has been having low back pain for last one week. And over the last few days family has noticed increasing swelling of the lower extremities. As the patient started patient has had CHF exacerbation at least 3 times over the last one year and a few months ago she was admitted in a hospital in [place] for meningitis. Patient was admitted in [facility] last year for CHF exacerbation. Patient states that she has been taking Lasix as advised. In addition patient was found to have very high blood sugar but not in DKA. Patient was initially placed on BiPAP. Patient was started on IV nitroglycerin infusion and Lasix IV was given after which patient's blood pressure improved and breathing improved and BiPAP was weaned off. As the patient is on 3 L of nasal cannula and will be admitted for further management. Patient had history of CAD but due to the patient's chronic kidney disease any further workup including cardiac catheter was deferred.  Review of Systems: As presented in the history of presenting illness, rest negative.  Past Medical History  Diagnosis Date  . CHF (congestive heart failure)   . COPD (chronic obstructive pulmonary disease)   . Coronary artery disease   . Hypertension   .  Diabetes mellitus without complication   . MI (myocardial infarction)   . Anginal pain   . Shortness of breath   . Sleep apnea   . Chronic kidney disease    Past Surgical History  Procedure Laterality Date  . Tubal ligation    . Abdominal hysterectomy     Social History:  reports that she has quit smoking. Her smokeless tobacco use includes Snuff and Chew. She reports that she does not drink alcohol or use illicit drugs. Home. where does patient live-- Not sure. Can patient participate in ADLs?  No Known Allergies  Family History  Problem Relation Age of Onset  . Diabetes Mellitus II Sister   . Diabetes Mellitus II Brother   . CAD Brother       Prior to Admission medications   Medication Sig Start Date End Date Taking? Authorizing Provider  albuterol (PROVENTIL HFA;VENTOLIN HFA) 108 (90 BASE) MCG/ACT inhaler Inhale 2 puffs into the lungs every 6 (six) hours as needed for wheezing or shortness of breath.    Yes Historical Provider, MD  aspirin EC 81 MG tablet Take 81 mg by mouth daily.   Yes Historical Provider, MD  ferrous sulfate 325 (65 FE) MG tablet Take 325 mg by mouth daily with breakfast.   Yes Historical Provider, MD  furosemide (LASIX) 40 MG tablet Take 40 mg by mouth daily.   Yes Historical Provider, MD  gabapentin (NEURONTIN) 300 MG capsule Take 300 mg by mouth 2 (two) times daily.   Yes Historical Provider, MD  hydrALAZINE (APRESOLINE) 50 MG tablet  Take 50 mg by mouth 3 (three) times daily.   Yes Historical Provider, MD  insulin lispro (HUMALOG) 100 UNIT/ML injection Inject 0-10 Units into the skin 3 (three) times daily before meals. Pt on sliding scale   Yes Historical Provider, MD  isosorbide mononitrate (IMDUR) 120 MG 24 hr tablet Take 120 mg by mouth daily.   Yes Historical Provider, MD  NIFEdipine (PROCARDIA XL/ADALAT-CC) 60 MG 24 hr tablet Take 60 mg by mouth daily.   Yes Historical Provider, MD  nitroGLYCERIN (NITROSTAT) 0.4 MG SL tablet Place 0.4 mg under the  tongue every 5 (five) minutes as needed for chest pain.    Yes Historical Provider, MD  omeprazole (PRILOSEC) 20 MG capsule Take 20 mg by mouth daily.   Yes Historical Provider, MD  traMADol (ULTRAM) 50 MG tablet Take 50 mg by mouth 3 (three) times daily as needed for pain.   Yes Historical Provider, MD   Physical Exam: Filed Vitals:   02/14/13 2130 02/14/13 2200 02/14/13 2230 02/14/13 2300  BP: 189/71 179/71 175/71 165/75  Pulse: 87 85 86 86  Temp:    98.5 F (36.9 C)  TempSrc:    Oral  Resp: 0 13 16 20   Height:    4\' 9"  (1.448 m)  Weight:    92 kg (202 lb 13.2 oz)  SpO2: 99% 100% 100% 100%     General:  Well-developed well-nourished.  Eyes: Anicteric no pallor.  ENT: No discharge from ears eyes nose mouth.  Neck: JVD elevated.  Cardiovascular: S1-S2 heard.  Respiratory: Bilateral expiratory wheeze and crepitations.  Abdomen: Soft nontender bowel sounds present.  Skin: No rash.  Musculoskeletal: Bilateral lower extremity edema extending up to the thigh.  Psychiatric: Appears normal.  Neurologic: Alert awake oriented to time place and person. Moves all extremities.  Labs on Admission:  Basic Metabolic Panel:  Recent Labs Lab 02/14/13 1853  NA 135  K 5.5*  CL 103  CO2 22  GLUCOSE 527*  BUN 32*  CREATININE 2.56*  CALCIUM 7.8*   Liver Function Tests:  Recent Labs Lab 02/14/13 1853  AST 42*  ALT 17  ALKPHOS 197*  BILITOT 0.1*  PROT 6.3  ALBUMIN 1.9*   No results found for this basename: LIPASE, AMYLASE,  in the last 168 hours No results found for this basename: AMMONIA,  in the last 168 hours CBC:  Recent Labs Lab 02/14/13 1853  WBC 9.9  NEUTROABS 8.1*  HGB 7.0*  HCT 21.6*  MCV 90.0  PLT 232   Cardiac Enzymes: No results found for this basename: CKTOTAL, CKMB, CKMBINDEX, TROPONINI,  in the last 168 hours  BNP (last 3 results)  Recent Labs  04/05/12 1307 02/14/13 1853  PROBNP 4578.0* 7354.0*   CBG: No results found for this  basename: GLUCAP,  in the last 168 hours  Radiological Exams on Admission: Dg Chest Port 1 View  02/14/2013   *RADIOLOGY REPORT*  Clinical Data: Shortness of breath  PORTABLE CHEST - 1 VIEW  Comparison: 04/05/2012  Findings:  Grossly unchanged enlarged cardiac silhouette and mediastinal contours.  Atherosclerotic calcifications within the thoracic aorta.  Pulmonary vasculature is indistinct with cephalization of flow.  Worsening perihilar and bibasilar heterogeneous opacities, left greater than right.  No definite pleural effusion or pneumothorax.  Grossly unchanged bones.  IMPRESSION:  Overall findings most worrisome for pulmonary edema, though note, atypical infection may have a similar appearance.  A follow-up chest radiograph in 4 to 6 weeks after treatment is recommended to ensure  resolution.   Original Report Authenticated By: Tacey Ruiz, MD     Assessment/Plan Principal Problem:   Acute respiratory failure Active Problems:   Acute pulmonary edema   HTN (hypertension), malignant   Chronic kidney disease (CKD), stage IV (severe)   Anemia   Diabetes mellitus type II, uncontrolled   1. Acute respiratory failure secondary to acute pulmonary edema - at this time I have placed patient on Lasix 60 mg IV every 6. Closely follow intake output and metabolic panel. Check 2-D echo. Patient's EF is no known. Continue IV nitroglycerin infusion. 2. Uncontrolled diabetes mellitus type 2 - ER physician had given IV insulin 10 units. I have ordered Lantus 5 units subcutaneous. If patient's CBG remains high will start IV insulin infusion. Check hemoglobin A1c. 3. Low back pain - patient has been having low back pain last one week. Patient is able to move her extremities without difficulty. Check CT abdomen and pelvis without contrast. 4. Chronic kidney disease - creatinine appears to be at baseline. Closely follow intake output and metabolic panel. Patient is on IV Lasix. Patient has mild hyperkalemia.  Follow metabolic panel closely and repeat metabolic panel has been ordered stat. 5. Anemia with stool for occult blood positive - once patient's respiratory status improves patient may need at least one unit of packed red blood cell transfusion. At this time I also place patient on IV Protonix for possible GI bleed 6. Malignant hypertension - at this time I have placed patient on IV nitroglycerin infusion. Continue home medications.  I have ordered a repeat EKG.  Code Status: Full code.  Family Communication: Patient's daughter at the bedside. Disposition Plan: Admit to inpatient.    Yassmine Tamm N. [facility] Pager [phone].  If 7PM-7AM, please contact night-coverage [email or web address] Password [facility] 02/14/2013, 11:39 PM

## 2013-02-14 NOTE — Progress Notes (Signed)
ABG ran @ 1919 Was ran on wrong pt. Please disregard

## 2013-02-14 NOTE — ED Notes (Signed)
Pt reports vomiting up blood every morning.

## 2013-02-14 NOTE — ED Provider Notes (Signed)
I saw and evaluated the patient, reviewed the resident's note and I agree with the findings and plan.  Please see my separate note regarding my evaluation of the patient.     Vida Roller, MD 02/14/13 [phone]

## 2013-02-14 NOTE — ED Provider Notes (Signed)
CSN: [id number]     Arrival date & time 02/14/13  1829 History   First MD Initiated Contact with Patient 02/14/13 1832     Chief Complaint  Patient presents with  . Shortness of Breath   (Consider location/radiation/quality/duration/timing/severity/associated sxs/prior Treatment) Patient is a 57 y.o. female presenting with shortness of breath.  Shortness of Breath Severity:  Severe Onset quality:  Gradual Duration:  1 day Timing:  Constant Progression:  Worsening Chronicity:  Recurrent Relieved by:  Sitting up Worsened by:  Exertion Ineffective treatments:  Diuretics Associated symptoms: chest pain, vomiting and wheezing   Associated symptoms: no abdominal pain, no cough, no diaphoresis, no fever, no headaches, no neck pain, no rash, no sore throat, no sputum production and no swollen glands     Past Medical History  Diagnosis Date  . CHF (congestive heart failure)   . COPD (chronic obstructive pulmonary disease)   . Coronary artery disease   . Hypertension   . Diabetes mellitus without complication   . MI (myocardial infarction)   . Anginal pain   . Shortness of breath   . Sleep apnea   . Chronic kidney disease    Past Surgical History  Procedure Laterality Date  . Tubal ligation    . Abdominal hysterectomy     Family History  Problem Relation Age of Onset  . Diabetes Mellitus II Sister   . Diabetes Mellitus II Brother   . CAD Brother    History  Substance Use Topics  . Smoking status: Former Games developer  . Smokeless tobacco: Current User    Types: Snuff, Chew  . Alcohol Use: No   OB History   Grav Para Term Preterm Abortions TAB SAB Ect Mult Living                 Review of Systems  Constitutional: Positive for chills. Negative for fever and diaphoresis.  HENT: Negative for sore throat and neck pain.   Respiratory: Positive for chest tightness, shortness of breath and wheezing. Negative for cough and sputum production.   Cardiovascular: Positive for chest  pain.  Gastrointestinal: Positive for nausea and vomiting. Negative for abdominal pain, diarrhea and constipation.  Genitourinary: Negative for dysuria and difficulty urinating.  Musculoskeletal: Negative for myalgias and arthralgias.  Skin: Negative for color change, pallor, rash and wound.  Neurological: Positive for weakness. Negative for headaches.  All other systems reviewed and are negative.    Allergies  Review of patient's allergies indicates no known allergies.  Home Medications   No current outpatient prescriptions on file. BP 175/71  Pulse 86  Temp(Src) 98.3 F (36.8 C) (Oral)  Resp 16  SpO2 100% Physical Exam  Nursing note and vitals reviewed. Constitutional: She appears well-developed. She appears distressed.  Ill appearing female in respiratory distress on bipap on arrival  HENT:  Head: Normocephalic and atraumatic.  Mouth/Throat: Oropharynx is clear and moist. No oropharyngeal exudate.  Eyes: Conjunctivae and EOM are normal. Pupils are equal, round, and reactive to light.  Neck: JVD (to angle of jaw) present.  Cardiovascular: Normal rate, regular rhythm and normal heart sounds.  Exam reveals no gallop and no friction rub.   No murmur heard. Pulmonary/Chest: Tachypnea noted. She is in respiratory distress. She has decreased breath sounds in the right lower field and the left lower field. She has rales (bilaterally).  Abdominal: Soft. She exhibits no distension. There is no tenderness.  Musculoskeletal: Normal range of motion. She exhibits edema (2+ pitting edema to bilateral arms  and legs diffusely). She exhibits no tenderness.  Skin: Skin is warm and dry. No rash noted. She is not diaphoretic.    ED Course  Procedures (including critical care time) Labs Review Labs Reviewed  CBC WITH DIFFERENTIAL - Abnormal; Notable for the following:    RBC 2.40 (*)    Hemoglobin 7.0 (*)    HCT 21.6 (*)    Neutrophils Relative % 82 (*)    Neutro Abs 8.1 (*)     Lymphocytes Relative 11 (*)    All other components within normal limits  PRO B NATRIURETIC PEPTIDE - Abnormal; Notable for the following:    Pro B Natriuretic peptide (BNP) 7354.0 (*)    All other components within normal limits  COMPREHENSIVE METABOLIC PANEL - Abnormal; Notable for the following:    Potassium 5.5 (*)    Glucose, Bld 527 (*)    BUN 32 (*)    Creatinine, Ser 2.56 (*)    Calcium 7.8 (*)    Albumin 1.9 (*)    AST 42 (*)    Alkaline Phosphatase 197 (*)    Total Bilirubin 0.1 (*)    GFR calc non Af Amer 20 (*)    GFR calc Af Amer 23 (*)    All other components within normal limits  URINALYSIS, ROUTINE W REFLEX MICROSCOPIC - Abnormal; Notable for the following:    APPearance CLOUDY (*)    Glucose, UA >1000 (*)    Hgb urine dipstick SMALL (*)    Protein, ur >300 (*)    All other components within normal limits  POCT I-STAT 3, BLOOD GAS (G3+) - Abnormal; Notable for the following:    pH, Arterial 7.502 (*)    pCO2 arterial 34.2 (*)    Bicarbonate 26.8 (*)    Acid-Base Excess 4.0 (*)    All other components within normal limits  POCT I-STAT 3, BLOOD GAS (G3P V) - Abnormal; Notable for the following:    pH, Ven 7.454 (*)    pCO2, Ven 32.8 (*)    pO2, Ven 202.0 (*)    All other components within normal limits  OCCULT BLOOD, POC DEVICE - Abnormal; Notable for the following:    Fecal Occult Bld POSITIVE (*)    All other components within normal limits  URINE CULTURE  MRSA PCR SCREENING  PROTIME-INR  URINE MICROSCOPIC-ADD ON  BLOOD GAS, VENOUS  POCT I-STAT TROPONIN I  TYPE AND SCREEN   Imaging Review Dg Chest Port 1 View  02/14/2013   *RADIOLOGY REPORT*  Clinical Data: Shortness of breath  PORTABLE CHEST - 1 VIEW  Comparison: 04/05/2012  Findings:  Grossly unchanged enlarged cardiac silhouette and mediastinal contours.  Atherosclerotic calcifications within the thoracic aorta.  Pulmonary vasculature is indistinct with cephalization of flow.  Worsening perihilar and  bibasilar heterogeneous opacities, left greater than right.  No definite pleural effusion or pneumothorax.  Grossly unchanged bones.  IMPRESSION:  Overall findings most worrisome for pulmonary edema, though note, atypical infection may have a similar appearance.  A follow-up chest radiograph in 4 to 6 weeks after treatment is recommended to ensure resolution.   Original Report Authenticated By: Tacey Ruiz, MD     Date: 02/14/2013  Rate: 95  Rhythm: normal sinus rhythm  QRS Axis: normal  Intervals: normal  ST/T Wave abnormalities: normal  Conduction Disutrbances:none  Narrative Interpretation:   Old EKG Reviewed: unchanged   MDM   1. Acute exacerbation of CHF (congestive heart failure)   2. Hypertensive urgency  3. Acute on chronic kidney disease, stage 3   4. Anemia   5. Hyperglycemia   6. Hyperkalemia   7. Respiratory distress     The patient is a 57 year old female with a history of hypertension, coronary artery disease, congestive heart failure, COPD who presents with increased dyspnea, peripheral edema. Patient states that over the last few days she's been progressively swelling more. She also states that she woke up this morning with significant respiratory distress but has not resolved throughout the day. She does endorse some mild chest discomfort as well associated with her dyspnea.  On arrival patient is afebrile, hypertensive to 207 systolic, tachypnea and, but not hypoxic when taken off of BiPAP on room air. On EMS arrival, the patient was 97% on room air. She was placed on BiPAP for work of breathing. Increase work of breathing as well as crackles and decreased breath sounds at the bases bilaterally. Concern for acute on chronic heart failure and this patient. ACS remains on the differential and will be evaluated with remainder of workup. Presentation history not consistent with PE, dissection. Troponin, basic labs, venous blood gas, EKG, chest x-ray, BNP. Aspirin 324 given.  Nitro drip and bipap for respiratory support and blood pressure support.  Angiocath insertion Performed by: Dorna Leitz  Consent: Verbal consent obtained. Risks and benefits: risks, benefits and alternatives were discussed Time out: Immediately prior to procedure a "time out" was called to verify the correct patient, procedure, equipment, support staff and site/side marked as required.  Preparation: Patient was prepped and draped in the usual sterile fashion.  Vein Location: Right brachial  Ultrasound Guided  Gauge: 20  Normal blood return and flush without difficulty Patient tolerance: Patient tolerated the procedure well with no immediate complications.  Labs show anemia with hemoglobin of 7. Rectal exam with no gross blood but Hemoccult faintly positive. Type and screen sent. Urine shows no signs of infection. Hyperkalemia of 5.5 as well as hyperglycemia 527 noted on metabolic panel. Patient has no anion gap and I'm not concerned for DKA in this patient. She was given 10 units of IV insulin in attempts to bring her blood glucose and her hyperkalemia down. She on chronic kidney injury with creatinine of 2.56 noted. This is up from previous around 1.9. BNP elevated at 7300. Based on these findings as well as chest x-ray showing cardiomegaly with diffuse pulmonary edema, history and physical exam, feel that CHF exacerbation most likely etiology of this patient's symptoms. Patient was not started on BiPAP, and she was maintained on nasal cannula with no worsening of respiratory distress. Based on respiratory status, nitro drip, consult with hospitalist for step down admission. Patient was admitted in critical but stable condition.   Patient was discussed with my attending, Dr. Hyacinth Meeker.  Dorna Leitz, MD 02/14/13 2308

## 2013-02-15 ENCOUNTER — Inpatient Hospital Stay (HOSPITAL_COMMUNITY): Payer: Medicaid Other

## 2013-02-15 DIAGNOSIS — N179 Acute kidney failure, unspecified: Secondary | ICD-10-CM

## 2013-02-15 DIAGNOSIS — I509 Heart failure, unspecified: Secondary | ICD-10-CM

## 2013-02-15 DIAGNOSIS — J810 Acute pulmonary edema: Secondary | ICD-10-CM

## 2013-02-15 DIAGNOSIS — I10 Essential (primary) hypertension: Secondary | ICD-10-CM

## 2013-02-15 DIAGNOSIS — I379 Nonrheumatic pulmonary valve disorder, unspecified: Secondary | ICD-10-CM

## 2013-02-15 DIAGNOSIS — D649 Anemia, unspecified: Secondary | ICD-10-CM

## 2013-02-15 LAB — GLUCOSE, CAPILLARY
Glucose-Capillary: 377 mg/dL — ABNORMAL HIGH (ref 70–99)
Glucose-Capillary: 128 mg/dL — ABNORMAL HIGH (ref 70–99)
Glucose-Capillary: 196 mg/dL — ABNORMAL HIGH (ref 70–99)
Glucose-Capillary: 186 mg/dL — ABNORMAL HIGH (ref 70–99)

## 2013-02-15 LAB — BASIC METABOLIC PANEL
BUN: 34 mg/dL — ABNORMAL HIGH (ref 6–23)
BUN: 33 mg/dL — ABNORMAL HIGH (ref 6–23)
CO2: 22 mEq/L (ref 19–32)
CO2: 22 mEq/L (ref 19–32)
Calcium: 7.5 mg/dL — ABNORMAL LOW (ref 8.4–10.5)
Chloride: 108 mEq/L (ref 96–112)
Creatinine, Ser: 2.59 mg/dL — ABNORMAL HIGH (ref 0.50–1.10)
Creatinine, Ser: 2.46 mg/dL — ABNORMAL HIGH (ref 0.50–1.10)
GFR calc Af Amer: 24 mL/min — ABNORMAL LOW (ref >90)
GFR calc non Af Amer: 21 mL/min — ABNORMAL LOW (ref >90)
Glucose, Bld: 167 mg/dL — ABNORMAL HIGH (ref 70–99)
Potassium: 4.8 mEq/L (ref 3.5–5.1)

## 2013-02-15 LAB — CBC
HCT: 18.8 % — ABNORMAL LOW (ref 36.0–46.0)
HCT: 16.3 % — ABNORMAL LOW (ref 36.0–46.0)
HCT: 25.8 % — ABNORMAL LOW (ref 36.0–46.0)
Hemoglobin: 5.5 g/dL — CL (ref 12.0–15.0)
MCH: 28.8 pg (ref 26.0–34.0)
MCH: 29.9 pg (ref 26.0–34.0)
MCH: 29.1 pg (ref 26.0–34.0)
MCHC: 33.7 g/dL (ref 30.0–36.0)
MCHC: 33.7 g/dL (ref 30.0–36.0)
MCV: 88.7 fL (ref 78.0–100.0)
MCV: 88.6 fL (ref 78.0–100.0)
MCV: 86.3 fL (ref 78.0–100.0)
Platelets: 209 10*3/uL (ref 150–400)
Platelets: 184 10*3/uL (ref 150–400)
RBC: 2.12 MIL/uL — ABNORMAL LOW (ref 3.87–5.11)
RBC: 1.84 MIL/uL — ABNORMAL LOW (ref 3.87–5.11)
RDW: 14.8 % (ref 11.5–15.5)
RDW: 15.5 % (ref 11.5–15.5)
WBC: 10.2 10*3/uL (ref 4.0–10.5)
WBC: 10.4 10*3/uL (ref 4.0–10.5)

## 2013-02-15 LAB — TROPONIN I
Troponin I: <0.3 ng/mL (ref <0.30)
Troponin I: <0.3 ng/mL (ref <0.30)

## 2013-02-15 LAB — PRO B NATRIURETIC PEPTIDE: Pro B Natriuretic peptide (BNP): 8501 pg/mL — ABNORMAL HIGH (ref 0–125)

## 2013-02-15 LAB — PREPARE RBC (CROSSMATCH)

## 2013-02-15 LAB — HEMOGLOBIN A1C: Hgb A1c MFr Bld: 7.7 % — ABNORMAL HIGH (ref <5.7)

## 2013-02-15 MED ORDER — FUROSEMIDE 10 MG/ML IJ SOLN
INTRAMUSCULAR | Status: AC
  Filled 2013-02-15: qty 8

## 2013-02-15 MED ORDER — PANTOPRAZOLE SODIUM 40 MG IV SOLR
40.0000 mg | Freq: Two times a day (BID) | INTRAVENOUS | Status: DC
  Administered 2013-02-15 – 2013-02-16 (×3): 40 mg via INTRAVENOUS
  Filled 2013-02-15 (×6): qty 40

## 2013-02-15 MED ORDER — FUROSEMIDE 10 MG/ML IJ SOLN
40.0000 mg | Freq: Two times a day (BID) | INTRAMUSCULAR | Status: DC
  Administered 2013-02-15 – 2013-02-16 (×2): 40 mg via INTRAVENOUS
  Filled 2013-02-15 (×2): qty 4

## 2013-02-15 NOTE — Progress Notes (Signed)
Utilization Review Completed.  

## 2013-02-15 NOTE — Progress Notes (Signed)
Nutrition Brief Note  Malnutrition Screening Tool result is inaccurate.  Please consult if nutrition needs are identified.  [name], MS RD LDN Clinical Inpatient Dietitian Pager: [phone] Weekend/After hours pager: [phone]   

## 2013-02-15 NOTE — Progress Notes (Signed)
[facility] PROGRESS NOTE  Jeaninne Conrey WUJ:[id number] DOB: 1956-02-20 DOA: 02/14/2013 PCP: Pcp Not In System  Interim history: 57 y.o. female history of CHF, CAD, chronic kidney disease, diabetes mellitus2, chronic anemia was brought to the ER patient was found to be acutely short of breath. The patient was found to have systolic blood pressure more than 200. Chest x-ray showed features consistent with possible pulmonary edema. Patient also found to have severe anemia with hemoglobin dropping to 5.5 this morning. She has positive stool occult blood. 2 units of blood transfusion has been ordered along with IV Lasix.   Assessment/Plan: 1. Acute respiratory failure, secondary to acute pulmonary edema-patient is now breathing better. BNP is elevated to 8501, Urine output around 1300 mL since last night. I will change the Lasix to 40 mg IV every 12 hours. 2-D echo has been ordered. Will request records from [facility] in [place] the patient got most of her care.  2. Anemia-patient has chronic anemia, which is probably secondary to anemia of chronic disease. The last hemoglobin in August 2014 was 9.0. Patient came with hemoglobin of 6.1 and this morning it was 5.5. Stool for occult blood is positive. 2 units of blood transfusion have already been ordered. Called and discussed with [facility] Dr Evette Cristal, he will see the patient today.Patient does have a remote history of gastric ulcers. Patient's daughter will get the records from home.  3. Acute on Chronic kidney disease-patient's baseline creatinine is around 1.96. She has CK D. stage III. Today creatinine is elevated to 2.59. Patient has been started on Lasix for pulmonary edema. We'll continue to monitor the BMP in the hospital.  4. Malignant hypertension-patient was started on IV nitroglycerin infusion in the ED. Will try to wean off the nitro drip, patient has been started on her home medications which include Procardia XL  60 mg once a day. Hydralazine 50 mg 3 times a day Imdur  120 mg by mouth daily. We'll continue to monitor the blood pressure.  5. Diabetes mellitus-patient's blood glucose is well controlled. Last blood sugar 128, we'll continue with Lantus 5 units daily along with sliding scale insulin sensitive.  6. Low back pain-patient complained of low back pain for 1 week. CT abdomen pelvis has been ordered by the admitting physician. We'll follow the results. Continue Neurontin 300 mg twice a day.  7. History of coronary artery disease-discussed with patient's daughter, she will get the copy of the records from home.  8. DVT prophylaxis-SCDs, no Lovenox due to GI bleed.    Code Status: Full code Family Communication: *Discussed with patient's daughter Byrd Hesselbach on phone Disposition Plan: *Home when stable   Consultants:  Gastroenterology  Procedures:  None  Antibiotics:  *None  HPI/Subjective: Patient seen and examined, admitted this morning with worsening shortness of breath found to have pulmonary edema and severe anemia. IV Lasix 60 mg IV every 6 hours and 2 units of blood transfusion have already been ordered. Today patient complains of mild shortness of breath but denies any chest pain.  Objective: Filed Vitals:   02/15/13 0431  BP: 164/60  Pulse: 95  Temp: 98.5 F (36.9 C)  Resp: 21    Intake/Output Summary (Last 24 hours) at 02/15/13 0733 Last data filed at 02/15/13 0629  Gross per 24 hour  Intake      0 ml  Output   1150 ml  Net  -1150 ml   Filed Weights   02/14/13 2300 02/15/13 0431  Weight: 92 kg (202 lb 13.2 oz) 92 kg (202 lb 13.2 oz)    Exam: Physical Exam: Head: Normocephalic, atraumatic.  Eyes: No signs of jaundice, EOMI Nose: Mucous membranes dry.  Throat: Oropharynx nonerythematous, no exudate appreciated.  Neck: supple,No deformities, masses, or tenderness noted. Lungs:Bibasilar crackles Heart: Regular RR. S1 and S2 normal  Abdomen: BS normoactive.  Soft, Nondistended, non-tender.  Extremities:B/L 1+ edema  Basic Metabolic Panel:  Recent Labs Lab 02/14/13 0028 02/14/13 1853 02/15/13 0602  NA 138 135 138  K 5.1 5.5* 4.8  CL 106 103 108  CO2 22 22 22   GLUCOSE 328* 527* 167*  BUN 34* 32* 33*  CREATININE 2.59* 2.56* 2.46*  CALCIUM 7.5* 7.8* 7.5*   Liver Function Tests:  Recent Labs Lab 02/14/13 1853  AST 42*  ALT 17  ALKPHOS 197*  BILITOT 0.1*  PROT 6.3  ALBUMIN 1.9*   No results found for this basename: LIPASE, AMYLASE,  in the last 168 hours No results found for this basename: AMMONIA,  in the last 168 hours CBC:  Recent Labs Lab 02/14/13 0028 02/14/13 1853 02/15/13 0602  WBC 10.2 9.9 9.5  NEUTROABS  --  8.1*  --   HGB 6.1* 7.0* 5.5*  HCT 18.8* 21.6* 16.3*  MCV 88.7 90.0 88.6  PLT 209 232 171   Cardiac Enzymes:  Recent Labs Lab 02/14/13 2333 02/15/13 0602  TROPONINI <0.30 <0.30   BNP (last 3 results)  Recent Labs  04/05/12 1307 02/14/13 1853 02/14/13 2333  PROBNP 4578.0* 7354.0* 8501.0*   CBG:  Recent Labs Lab 02/14/13 2253 02/15/13 0437  GLUCAP 377* 181*    Recent Results (from the past 240 hour(s))  MRSA PCR SCREENING     Status: None   Collection Time    02/14/13 10:53 PM      Result Value Range Status   MRSA by PCR NEGATIVE  NEGATIVE Final   Comment:            The GeneXpert MRSA Assay (FDA     approved for NASAL specimens     only), is one component of a     comprehensive MRSA colonization     surveillance program. It is not     intended to diagnose MRSA     infection nor to guide or     monitor treatment for     MRSA infections.     Studies: Dg Chest Port 1 View  02/14/2013   *RADIOLOGY REPORT*  Clinical Data: Shortness of breath  PORTABLE CHEST - 1 VIEW  Comparison: 04/05/2012  Findings:  Grossly unchanged enlarged cardiac silhouette and mediastinal contours.  Atherosclerotic calcifications within the thoracic aorta.  Pulmonary vasculature is indistinct with  cephalization of flow.  Worsening perihilar and bibasilar heterogeneous opacities, left greater than right.  No definite pleural effusion or pneumothorax.  Grossly unchanged bones.  IMPRESSION:  Overall findings most worrisome for pulmonary edema, though note, atypical infection may have a similar appearance.  A follow-up chest radiograph in 4 to 6 weeks after treatment is recommended to ensure resolution.   Original Report Authenticated By: Tacey Ruiz, MD    Scheduled Meds: . aspirin EC  81 mg Oral Daily  . aspirin EC  81 mg Oral Daily  . ferrous sulfate  325 mg Oral Q breakfast  . furosemide      . furosemide  60 mg Intravenous Q6H  . gabapentin  300 mg Oral BID  . hydrALAZINE  50 mg Oral TID  .  insulin aspart  0-9 Units Subcutaneous TID WC  . isosorbide mononitrate  120 mg Oral Daily  . NIFEdipine  60 mg Oral Daily  . pantoprazole  40 mg Oral Daily  . pantoprazole (PROTONIX) IV  40 mg Intravenous Q12H  . sodium chloride  3 mL Intravenous Q12H  . sodium chloride  3 mL Intravenous Q12H   Continuous Infusions: . nitroGLYCERIN 30 mcg/min (02/15/13 0000)    Principal Problem:   Acute respiratory failure Active Problems:   Acute pulmonary edema   HTN (hypertension), malignant   Chronic kidney disease (CKD), stage IV (severe)   Anemia   Diabetes mellitus type II, uncontrolled    Time spent: 30 minutes    [facility]  [facility] Pager [phone]*. If 7PM-7AM, please contact night-coverage at [email or web address], password [facility] 02/15/2013, 7:33 AM  LOS: 1 day

## 2013-02-15 NOTE — Progress Notes (Signed)
  Echocardiogram 2D Echocardiogram has been performed.  Michaelyn Wall FRANCES 02/15/2013, 10:42 AM

## 2013-02-15 NOTE — Consult Note (Signed)
Subjective:   HPI  Madeline [name] is a 57 year old female who came into [facility] hospital with acute shortness of breath. Chest x-ray showed features consistent with possible pulmonary edema. She was found to have a hemoglobin initially of 9 but it dropped to 6.1. She denies vomiting blood or passing blood in her stool that she is aware of. She was found however to be heme positive. She states that for Mad[facility] past 3 weeks she has been having problems with vomiting every morning. Mad[facility] color of her emesis is either yellow, green, white blood sometimes she sees streaks of blood. She has been having heartburn also.  Review of Systems Short of breath  Past Medical History  Diagnosis Date  . CHF (congestive heart failure)   . COPD (chronic obstructive pulmonary disease)   . Coronary artery disease   . Hypertension   . Diabetes mellitus without complication   . MI (myocardial infarction)   . Anginal pain   . Shortness of breath   . Sleep apnea   . Chronic kidney disease    Past Surgical History  Procedure Laterality Date  . Tubal ligation    . Abdominal hysterectomy     History   Social History  . Marital Status: Widowed    Spouse Name: N/A    Number of Children: N/A  . Years of Education: N/A   Occupational History  . Not on file.   Social History Main Topics  . Smoking status: Former Games developer  . Smokeless tobacco: Current User    Types: Snuff, Chew  . Alcohol Use: No  . Drug Use: No  . Sexual Activity: No   Other Topics Concern  . Not on file   Social History Narrative  . No narrative on file   family history includes CAD in her brother; Diabetes Mellitus II in her brother and sister. Current facility-administered medications:acetaminophen (TYLENOL) suppository 650 mg, 650 mg, Rectal, Q6H PRN, Eduard Clos, MD;  acetaminophen (TYLENOL) tablet 650 mg, 650 mg, Oral, Q6H PRN, Eduard Clos, MD, 650 mg at 02/15/13 5409;  aspirin EC tablet 81 mg, 81 mg, Oral, Daily, Eduard Clos, MD, 81 mg at 02/15/13 1153;  ferrous sulfate tablet 325 mg, 325 mg, Oral, Q breakfast, Eduard Clos, MD, 325 mg at 02/15/13 1154 furosemide (LASIX) 10 MG/ML injection, , , , ;  furosemide (LASIX) injection 40 mg, 40 mg, Intravenous, Q12H, Meredeth Ide, MD;  gabapentin (NEURONTIN) capsule 300 mg, 300 mg, Oral, BID, Eduard Clos, MD, 300 mg at 02/15/13 1154;  hydrALAZINE (APRESOLINE) tablet 50 mg, 50 mg, Oral, TID, Eduard Clos, MD, 50 mg at 02/15/13 1154 insulin aspart (novoLOG) injection 0-9 Units, 0-9 Units, Subcutaneous, TID WC, Eduard Clos, MD, 2 Units at 02/15/13 1300;  isosorbide mononitrate (IMDUR) 24 hr tablet 120 mg, 120 mg, Oral, Daily, Eduard Clos, MD;  morphine 2 MG/ML injection 1 mg, 1 mg, Intravenous, Q4H PRN, Eduard Clos, MD, 1 mg at 02/15/13 0011 NIFEdipine (PROCARDIA-XL/ADALAT CC) 24 hr tablet 60 mg, 60 mg, Oral, Daily, Eduard Clos, MD, 60 mg at 02/15/13 1154;  nitroGLYCERIN 0.2 mg/mL in dextrose 5 % infusion, 2-200 mcg/min, Intravenous, Titrated, Eduard Clos, MD, Last Rate: 6 mL/hr at 02/15/13 1326, 20 mcg/min at 02/15/13 1326;  ondansetron (ZOFRAN) injection 4 mg, 4 mg, Intravenous, Q6H PRN, Eduard Clos, MD ondansetron (ZOFRAN) tablet 4 mg, 4 mg, Oral, Q6H PRN, Eduard Clos, MD;  pantoprazole (PROTONIX) EC tablet 40  mg, 40 mg, Oral, Daily, Eduard Clos, MD;  pantoprazole (PROTONIX) injection 40 mg, 40 mg, Intravenous, Q12H, Eduard Clos, MD;  sodium chloride 0.9 % injection 3 mL, 3 mL, Intravenous, Q12H, Eduard Clos, MD;  sodium chloride 0.9 % injection 3 mL, 3 mL, Intravenous, Q12H, Eduard Clos, MD traMADol (ULTRAM) tablet 50 mg, 50 mg, Oral, TID PRN, Eduard Clos, MD No Known Allergies   Objective:     BP 162/73  Pulse 85  Temp(Src) 99.3 F (37.4 C) (Oral)  Resp 11  Ht 4\' 9"  (1.448 m)  Wt 92 kg (202 lb 13.2 oz)  BMI 43.88 kg/m2  SpO2 100%  Somewhat short  of breath but states she is feeling much better than yesterday.  Heart regular rhythm  Lungs him wheezing and some crackles bilaterally  Abdomen: Bowel sounds normal, soft, nontender  Laboratory No components found with this basename: d1      Assessment:     #1. Anemia  #2. Heme positive stool.  #3. Vomiting  #4. Multiple medical problems as noted above      Plan:     Given Madeline anemia, and heme positive stool, and drop in her hemoglobin and hematocrit and her vomiting, I will set her up for an EGD tomorrow. Lab Results  Component Value Date   HGB 5.5* 02/15/2013   HGB 7.0* 02/14/2013   HGB 6.1* 02/14/2013   HCT 16.3* 02/15/2013   HCT 21.6* 02/14/2013   HCT 18.8* 02/14/2013   ALKPHOS 197* 02/14/2013   ALKPHOS 82 12/15/2012   ALKPHOS 112 04/05/2012   AST 42* 02/14/2013   AST 31 12/15/2012   AST 25 04/05/2012   ALT 17 02/14/2013   ALT 14 12/15/2012   ALT 13 04/05/2012

## 2013-02-16 ENCOUNTER — Encounter (HOSPITAL_COMMUNITY): Payer: Self-pay

## 2013-02-16 ENCOUNTER — Encounter (HOSPITAL_COMMUNITY)
Admission: EM | Disposition: A | Discharge status: Home Health | Payer: Self-pay | Source: Home / Self Care | Attending: Internal Medicine

## 2013-02-16 DIAGNOSIS — I5031 Acute diastolic (congestive) heart failure: Secondary | ICD-10-CM

## 2013-02-16 DIAGNOSIS — G4733 Obstructive sleep apnea (adult) (pediatric): Secondary | ICD-10-CM | POA: Diagnosis present

## 2013-02-16 DIAGNOSIS — R601 Generalized edema: Secondary | ICD-10-CM | POA: Diagnosis present

## 2013-02-16 DIAGNOSIS — I50811 Acute right heart failure: Secondary | ICD-10-CM | POA: Diagnosis present

## 2013-02-16 HISTORY — PX: ESOPHAGOGASTRODUODENOSCOPY: SHX5428

## 2013-02-16 LAB — BASIC METABOLIC PANEL
CO2: 24 mEq/L (ref 19–32)
Calcium: 8.2 mg/dL — ABNORMAL LOW (ref 8.4–10.5)
Creatinine, Ser: 2.44 mg/dL — ABNORMAL HIGH (ref 0.50–1.10)
GFR calc non Af Amer: 21 mL/min — ABNORMAL LOW (ref >90)
Glucose, Bld: 163 mg/dL — ABNORMAL HIGH (ref 70–99)

## 2013-02-16 LAB — URINE CULTURE
Colony Count: NO GROWTH
Culture: NO GROWTH
Special Requests: NORMAL

## 2013-02-16 LAB — GLUCOSE, CAPILLARY: Glucose-Capillary: 190 mg/dL — ABNORMAL HIGH (ref 70–99)

## 2013-02-16 SURGERY — EGD (ESOPHAGOGASTRODUODENOSCOPY)
Anesthesia: Moderate Sedation

## 2013-02-16 MED ORDER — FUROSEMIDE 10 MG/ML IJ SOLN
40.0000 mg | Freq: Three times a day (TID) | INTRAMUSCULAR | Status: DC
  Administered 2013-02-16 – 2013-02-20 (×12): 40 mg via INTRAVENOUS
  Filled 2013-02-16 (×15): qty 4

## 2013-02-16 MED ORDER — FENTANYL CITRATE 0.05 MG/ML IJ SOLN
INTRAMUSCULAR | Status: AC
  Filled 2013-02-16: qty 2

## 2013-02-16 MED ORDER — WHITE PETROLATUM GEL
Status: AC
  Administered 2013-02-16: 0.2
  Filled 2013-02-16: qty 5

## 2013-02-16 MED ORDER — LABETALOL HCL 5 MG/ML IV SOLN
20.0000 mg | INTRAVENOUS | Status: DC | PRN
  Administered 2013-02-17 – 2013-03-01 (×8): 20 mg via INTRAVENOUS
  Filled 2013-02-16 (×8): qty 4

## 2013-02-16 MED ORDER — BUTAMBEN-TETRACAINE-BENZOCAINE 2-2-14 % EX AERO
INHALATION_SPRAY | CUTANEOUS | Status: DC | PRN
  Administered 2013-02-16: 2 via TOPICAL

## 2013-02-16 MED ORDER — FENTANYL CITRATE 0.05 MG/ML IJ SOLN
INTRAMUSCULAR | Status: DC | PRN
  Administered 2013-02-16: 25 ug via INTRAVENOUS

## 2013-02-16 MED ORDER — SODIUM CHLORIDE 0.9 % IV SOLN
INTRAVENOUS | Status: DC
  Administered 2013-02-16 (×2): via INTRAVENOUS

## 2013-02-16 MED ORDER — LABETALOL HCL 5 MG/ML IV SOLN
20.0000 mg | INTRAVENOUS | Status: AC
  Administered 2013-02-16: 20 mg via INTRAVENOUS

## 2013-02-16 MED ORDER — MIDAZOLAM HCL 5 MG/ML IJ SOLN
INTRAMUSCULAR | Status: AC
  Filled 2013-02-16: qty 2

## 2013-02-16 MED ORDER — LABETALOL HCL 5 MG/ML IV SOLN
INTRAVENOUS | Status: AC
  Filled 2013-02-16: qty 4

## 2013-02-16 MED ORDER — MIDAZOLAM HCL 10 MG/2ML IJ SOLN
INTRAMUSCULAR | Status: DC | PRN
  Administered 2013-02-16: 1 mg via INTRAVENOUS
  Administered 2013-02-16: 2 mg via INTRAVENOUS

## 2013-02-16 NOTE — Progress Notes (Signed)
[facility] Progress Note [place] TEAM 1 - Stepdown ICU Team   Madeline [name] ZOX:[id number] DOB: [birth year] DOA: 02/14/2013 PCP: Pcp Not In System  Brief narrative: 57 year old female patient with history of congestive heart failure coronary artery disease as well as chronic kidney disease. She also has diabetes and chronic anemia related to her kidney disease. She presented to the hospital with complaints of shortness of breath and was found systolic blood pressure greater than 200. Chest x-ray was consistent with possible pulmonary edema. Patient had rapid onset of shortness of breath according to her family and no constitutional symptoms that would lead one to believe she had an infectious process. He over the past several days patient has also had increasing swelling of her extremities. She has had several CHF exacerbations over the past year and has been hospitalized. Due to the degree of respiratory failure she did require BiPAP in the emergency department. She was also temporarily started on IV nitroglycerin and a dose of IV Lasix was given with subsequent improvement in the patient's blood pressure and breathing therefore BiPAP was weaned off. She was stable nasal cannula oxygen upon the admitting physician's examination.  Assessment/Plan: Active Problems:   Acute respiratory failure with hypoxia due to   A) Flash pulmonary edema   B) Acute diastolic CHF (congestive heart failure)   C)  Acute right heart failure -likely multifactorial: flash edema/DHF due to uncontrolled BP, RHF from possible noncompliance with CPAP -will increase Lasix to TID -make sure BP adequately controlled -certainly exacerbated by acute anemia/myocardial demand    HTN (hypertension), malignant -give all usual AM meds NOW -give 1x dose Labetelol now    Anasarca and ascites -possibly due to chronic malnutrition-pt endorsed poor intake and  - odynophagia? -may need nutrition eval    ARF (acute renal  failure) on   Chronic kidney disease (CKD), stage IV (severe) -likely due to poor perfusion from marked HTN in setting of anemia- uncontrolled BP may have been compensatory mechanism in setting of acute anemia    Anemia, chronic disease/Acute blood loss anemia -hgb nadir 5.5 -hgb up to 8.7 after PRBCs -check CBC in am -EGD revealed smal multiple focal antral ulcers    DM (diabetes mellitus) -CBG moderate control -cont SSI -was not on long acting meds pre admit    OSA on CPAP -cont IP   DVT prophylaxis: SCDs Code Status: Full Family Communication: The patient and daughter at bedside Disposition Plan/Expected LOS: Remain in step down Isolation: None Nutritional Status: Suspect chronic protein calorie malnutrition related to patient reports of odynophagia and adequate intake for some time  Consultants: Gastroenterology  Procedures: EGD pending for today  2-D echocardiogram - Left ventricle: The cavity size was normal. Wall thickness was increased in a pattern of moderate to severe LVH. The estimated ejection fraction was 65%. Wall motion was normal; there were no regional wall motion abnormalities. Findings consistent with left ventricular diastolic dysfunction. Doppler parameters are consistent with high ventricular filling pressure. - Left atrium: The atrium was mildly dilated. - Right ventricle: The cavity size was moderately dilated. Systolic function was moderately reduced. - Right atrium: The atrium was mildly dilated. - Pulmonary arteries: PA peak pressure: 66mm Hg (S).  Antibiotics: None   HPI/Subjective: Patient alert and complaining of swelling all over. Currently denies shortness of breath, chest pain or headache.   Objective: Blood pressure 159/75, pulse 71, temperature 99.1 F (37.3 C), temperature source Oral, resp. rate 20, height 4\' 9"  (1.448 m), weight 92  kg (202 lb 13.2 oz), SpO2 100.00%.  Intake/Output Summary (Last 24 hours) at 02/16/13  1054 Last data filed at 02/16/13 1027  Gross per 24 hour  Intake 187.33 ml  Output   3095 ml  Net -2907.67 ml     Exam: General: No acute respiratory distress Lungs: Clear to auscultation bilaterally without wheezes or crackles, RA Cardiovascular: Regular rate and rhythm without murmur gallop or rub normal S1 and S2, positive peripheral edema more marked in upper extremities but without JVD; anasarca is present Abdomen: Nontender, nondistended, soft, bowel sounds positive, no rebound, no ascites, no appreciable mass Musculoskeletal: No significant cyanosis, clubbing of bilateral lower extremities Neurological: Alert and oriented x 3, moves all extremities x 4 without focal neurological deficits, CN 2-12 intact  Scheduled Meds:  Scheduled Meds: . [facility] HOLD] aspirin EC  81 mg Oral Daily  . [facility] HOLD] ferrous sulfate  325 mg Oral Q breakfast  . [[facility] HOLD] furosemide  40 mg Intravenous Q8H  . [facility] HOLD] gabapentin  300 mg Oral BID  . [[facility] HOLD] hydrALAZINE  50 mg Oral TID  . [[facility] HOLD] insulin aspart  0-9 Units Subcutaneous TID WC  . [facility] HOLD] isosorbide mononitrate  120 mg Oral Daily  . [facility] HOLD] NIFEdipine  60 mg Oral Daily  . [[facility] HOLD] pantoprazole  40 mg Oral Daily  . [[facility] HOLD] pantoprazole (PROTONIX) IV  40 mg Intravenous Q12H  . [[facility] HOLD] sodium chloride  3 mL Intravenous Q12H  . [facility] HOLD] sodium chloride  3 mL Intravenous Q12H   Continuous Infusions: . sodium chloride 20 mL/hr at 02/16/13 1027  . [facility] HOLD] nitroGLYCERIN 20 mcg/min (02/15/13 1326)    **Reviewed in detail by the Attending Physicia  Data Reviewed: Basic Metabolic Panel:  Recent Labs Lab 02/14/13 0028 02/14/13 1853 02/15/13 0602  NA 138 135 138  K 5.1 5.5* 4.8  CL 106 103 108  CO2 22 22 22   GLUCOSE 328* 527* 167*  BUN 34* 32* 33*  CREATININE 2.59* 2.56* 2.46*  CALCIUM 7.5* 7.8* 7.5*   Liver Function Tests:  Recent Labs Lab 02/14/13 1853  AST 42*  ALT 17  ALKPHOS 197*  BILITOT 0.1*   PROT 6.3  ALBUMIN 1.9*   No results found for this basename: LIPASE, AMYLASE,  in the last 168 hours No results found for this basename: AMMONIA,  in the last 168 hours CBC:  Recent Labs Lab 02/14/13 0028 02/14/13 1853 02/15/13 0602 02/15/13 2044  WBC 10.2 9.9 9.5 10.4  NEUTROABS  --  8.1*  --   --   HGB 6.1* 7.0* 5.5* 8.7*  HCT 18.8* 21.6* 16.3* 25.8*  MCV 88.7 90.0 88.6 86.3  PLT 209 232 171 184   Cardiac Enzymes:  Recent Labs Lab 02/14/13 2333 02/15/13 0602 02/15/13 1508  TROPONINI <0.30 <0.30 <0.30   BNP (last 3 results)  Recent Labs  04/05/12 1307 02/14/13 1853 02/14/13 2333  PROBNP 4578.0* 7354.0* 8501.0*   CBG:  Recent Labs Lab 02/15/13 0834 02/15/13 1200 02/15/13 1559 02/15/13 2100 02/16/13 0824  GLUCAP 128* 163* 196* 186* 206*    Recent Results (from the past 240 hour(s))  URINE CULTURE     Status: None   Collection Time    02/14/13  8:18 PM      Result Value Range Status   Specimen Description URINE, CATHETERIZED   Final   Special Requests Normal   Final   Culture  Setup Time     Final  Value: 02/15/2013 02:50     Performed at [facility] Count     Final   Value: NO GROWTH     Performed at [facility]   Culture     Final   Value: NO GROWTH     Performed at [facility]   Report Status 02/16/2013 FINAL   Final  MRSA PCR SCREENING     Status: None   Collection Time    02/14/13 10:53 PM      Result Value Range Status   MRSA by PCR NEGATIVE  NEGATIVE Final   Comment:            The GeneXpert MRSA Assay (FDA     approved for NASAL specimens     only), is one component of a     comprehensive MRSA colonization     surveillance program. It is not     intended to diagnose MRSA     infection nor to guide or     monitor treatment for     MRSA infections.     Studies:  Recent x-ray studies have been reviewed in detail by the Attending Physician    Junious Silk, ANP [facility] Office  [phone] Pager [phone]  **If unable to reach the above provider after paging please contact the Flow Manager @ [phone]  On-Call/Text [place]:      [email or web address]      password [email or web address]  If 7PM-7AM, please contact night-coverage [email or web address] Password [email or web address] 02/16/2013, 10:54 AM   LOS: 2 days   I have examined the patient, reviewed the chart and modified the above note which I agree with.   Shylee Durrett,MD [phone] 02/16/2013, 3:08 PM

## 2013-02-16 NOTE — Op Note (Signed)
[facility] [address] [place], [address]   ENDOSCOPY PROCEDURE REPORT  PATIENT: Madeline [name], Madeline [name]  MR#: [id number] BIRTHDATE: 16-Jan-1956 , 57  yrs. old GENDER: Female ENDOSCOPIST: Wandalee Ferdinand, MD REFERRED BY: PROCEDURE DATE:  02/16/2013 PROCEDURE:   EGD with biopsy ASA CLASS: 3 INDICATIONS: vomiting, anemia MEDICATIONS: fentanyl 25 mcg IV, Versed 3 mg IV TOPICAL ANESTHETIC: Cetacaine spray  DESCRIPTION OF PROCEDURE:   After the risks benefits and alternatives of the procedure were thoroughly explained, informed consent was obtained.  The Pentax Gastroscope [id number]  endoscope was introduced through the mouth and advanced to the second portion of the duodenum      , limited by Without limitations.   The instrument was slowly withdrawn as the mucosa was fully examined.      FINDINGS:  Esophagus: Normal  Stomach: Small hiatal hernia. In the prepyloric antrum of the stomach the mucosa was very edematous-appearing with some scattered focal ulcerations. Biopsies obtained.  Duodenum: Normal  COMPLICATIONS:none  ENDOSCOPIC IMPRESSION:see above   RECOMMENDATIONS:PPI therapy, check biopsies.      _______________________________ Rhodia Albright, MD 02/16/2013 11:52 AM       PATIENT NAME:  [name], [name] MR#: [id number]

## 2013-02-17 ENCOUNTER — Encounter (HOSPITAL_COMMUNITY): Payer: Self-pay | Admitting: Gastroenterology

## 2013-02-17 DIAGNOSIS — R609 Edema, unspecified: Secondary | ICD-10-CM

## 2013-02-17 DIAGNOSIS — D638 Anemia in other chronic diseases classified elsewhere: Secondary | ICD-10-CM

## 2013-02-17 LAB — CBC
HCT: 24.2 % — ABNORMAL LOW (ref 36.0–46.0)
Hemoglobin: 8.1 g/dL — ABNORMAL LOW (ref 12.0–15.0)
MCH: 29.3 pg (ref 26.0–34.0)
MCHC: 33.5 g/dL (ref 30.0–36.0)
MCV: 87.7 fL (ref 78.0–100.0)
RBC: 2.76 MIL/uL — ABNORMAL LOW (ref 3.87–5.11)

## 2013-02-17 LAB — BASIC METABOLIC PANEL
BUN: 40 mg/dL — ABNORMAL HIGH (ref 6–23)
CO2: 23 mEq/L (ref 19–32)
Calcium: 7.9 mg/dL — ABNORMAL LOW (ref 8.4–10.5)
Creatinine, Ser: 2.4 mg/dL — ABNORMAL HIGH (ref 0.50–1.10)
GFR calc Af Amer: 25 mL/min — ABNORMAL LOW (ref >90)
GFR calc non Af Amer: 22 mL/min — ABNORMAL LOW (ref >90)
Glucose, Bld: 337 mg/dL — ABNORMAL HIGH (ref 70–99)
Potassium: 4.9 mEq/L (ref 3.5–5.1)
Sodium: 138 mEq/L (ref 135–145)

## 2013-02-17 LAB — GLUCOSE, CAPILLARY
Glucose-Capillary: 334 mg/dL — ABNORMAL HIGH (ref 70–99)
Glucose-Capillary: 301 mg/dL — ABNORMAL HIGH (ref 70–99)
Glucose-Capillary: 183 mg/dL — ABNORMAL HIGH (ref 70–99)
Glucose-Capillary: 91 mg/dL (ref 70–99)

## 2013-02-17 MED ORDER — INSULIN GLARGINE 100 UNIT/ML ~~LOC~~ SOLN
20.0000 [IU] | Freq: Every day | SUBCUTANEOUS | Status: DC
  Administered 2013-02-17: 20 [IU] via SUBCUTANEOUS
  Filled 2013-02-17: qty 0.2

## 2013-02-17 MED ORDER — INSULIN ASPART 100 UNIT/ML ~~LOC~~ SOLN
0.0000 [IU] | Freq: Every day | SUBCUTANEOUS | Status: DC
  Administered 2013-02-18: 3 [IU] via SUBCUTANEOUS
  Administered 2013-02-20 – 2013-02-21 (×2): 2 [IU] via SUBCUTANEOUS

## 2013-02-17 MED ORDER — PANTOPRAZOLE SODIUM 40 MG PO TBEC
40.0000 mg | DELAYED_RELEASE_TABLET | Freq: Two times a day (BID) | ORAL | Status: DC
  Administered 2013-02-17 – 2013-03-05 (×32): 40 mg via ORAL
  Filled 2013-02-17 (×30): qty 1

## 2013-02-17 MED ORDER — INSULIN ASPART 100 UNIT/ML ~~LOC~~ SOLN
10.0000 [IU] | Freq: Three times a day (TID) | SUBCUTANEOUS | Status: DC
  Administered 2013-02-17 – 2013-02-18 (×3): 10 [IU] via SUBCUTANEOUS

## 2013-02-17 MED ORDER — INSULIN ASPART 100 UNIT/ML ~~LOC~~ SOLN: 0.0000 [IU] | Freq: Three times a day (TID) | SUBCUTANEOUS | Status: DC

## 2013-02-17 MED ORDER — CARVEDILOL PHOSPHATE ER 20 MG PO CP24
20.0000 mg | ORAL_CAPSULE | Freq: Every day | ORAL | Status: DC
  Administered 2013-02-17 – 2013-02-21 (×5): 20 mg via ORAL
  Filled 2013-02-17 (×6): qty 1

## 2013-02-17 MED ORDER — LABETALOL HCL 200 MG PO TABS
200.0000 mg | ORAL_TABLET | Freq: Two times a day (BID) | ORAL | Status: DC
  Filled 2013-02-17 (×2): qty 1

## 2013-02-17 MED ORDER — HYDRALAZINE HCL 50 MG PO TABS
100.0000 mg | ORAL_TABLET | Freq: Three times a day (TID) | ORAL | Status: DC
  Administered 2013-02-17 – 2013-03-05 (×48): 100 mg via ORAL
  Filled 2013-02-17 (×51): qty 2

## 2013-02-17 NOTE — Progress Notes (Signed)
[facility] Progress Note  Subjective: Patient denies any vomiting today. She tolerated her breakfast.  Objective: Vital signs in last 24 hours: Temp:  [98.2 F (36.8 C)-99.8 F (37.7 C)] 98.5 F (36.9 C) (10/10 0823) Pulse Rate:  [70-99] 74 (10/10 0823) Resp:  [8-25] 20 (10/10 0823) BP: (149-215)/(67-120) 215/82 mmHg (10/10 0951) SpO2:  [96 %-100 %] 96 % (10/10 0823) Weight:  [90.7 kg (199 lb 15.3 oz)] 90.7 kg (199 lb 15.3 oz) (10/10 0500) Weight change: -1.3 kg (-2 lb 13.9 oz)   PE:  Abdomen is soft and nontender  Lab Results: Results for orders placed during the hospital encounter of 02/14/13 (from the past 24 hour(s))  BASIC METABOLIC PANEL     Status: Abnormal   Collection Time    02/16/13  4:48 PM      Result Value Range   Sodium 139  135 - 145 mEq/L   Potassium 5.0  3.5 - 5.1 mEq/L   Chloride 105  96 - 112 mEq/L   CO2 24  19 - 32 mEq/L   Glucose, Bld 163 (*) 70 - 99 mg/dL   BUN 38 (*) 6 - 23 mg/dL   Creatinine, Ser 1.30 (*) 0.50 - 1.10 mg/dL   Calcium 8.2 (*) 8.4 - 10.5 mg/dL   GFR calc non Af Amer 21 (*) >90 mL/min   GFR calc Af Amer 25 (*) >90 mL/min  GLUCOSE, CAPILLARY     Status: Abnormal   Collection Time    02/16/13  5:28 PM      Result Value Range   Glucose-Capillary 190 (*) 70 - 99 mg/dL  GLUCOSE, CAPILLARY     Status: Abnormal   Collection Time    02/16/13  9:35 PM      Result Value Range   Glucose-Capillary 269 (*) 70 - 99 mg/dL   Comment 1 Documented in Chart     Comment 2 Notify RN    CBC     Status: Abnormal   Collection Time    02/17/13  4:20 AM      Result Value Range   WBC 9.3  4.0 - 10.5 K/uL   RBC 2.76 (*) 3.87 - 5.11 MIL/uL   Hemoglobin 8.1 (*) 12.0 - 15.0 g/dL   HCT 86.5 (*) 78.4 - 69.6 %   MCV 87.7  78.0 - 100.0 fL   MCH 29.3  26.0 - 34.0 pg   MCHC 33.5  30.0 - 36.0 g/dL   RDW 29.5 (*) 28.4 - 13.2 %   Platelets 181  150 - 400 K/uL  GLUCOSE, CAPILLARY     Status: Abnormal   Collection Time    02/17/13  8:22 AM     Result Value Range   Glucose-Capillary 334 (*) 70 - 99 mg/dL    Studies/Results: @RISRSLT24 @    Assessment: Vomiting  Anemia  Antrum of the stomach showing edema and small ulcerations  Plan: Continue PPI therapy. We will sign off.    Ivy Puryear F 02/17/2013, 10:55 AM

## 2013-02-17 NOTE — Evaluation (Signed)
Physical Therapy Evaluation Patient Details Name: Madeline [name] MRN: [id number] DOB: [birth year] Today's Date: 02/17/2013 Time: 1191-4782 PT Time Calculation (min): 29 min  PT Assessment / Plan / Recommendation History of Present Illness  57 year old female patient with history of congestive heart failure coronary artery disease as well as chronic kidney disease. She also has diabetes and chronic anemia related to her kidney disease. She presented to the hospital with complaints of shortness of breath and was found systolic blood pressure greater than 200. Chest x-ray was consistent with possible pulmonary edema. Patient had rapid onset of shortness of breath according to her family and no constitutional symptoms that would lead one to believe she had an infectious process. He over the past several days patient has also had increasing swelling of her extremities. She has had several CHF exacerbations over the past year and has been hospitalized.  Clinical Impression  Ms.Youtz is a very pleasant lady who has been experiencing increasing weakness and decreased function over the last several years. Family comes in and out to check on her throughout the day but pt is unable to perform her own pericare, at times cannot get up or OOB on her own and does not have someone that stays with her. Pt wants to be able to care for herself and is agreeable to ST-SNF with potential continued HHPT after rehab stay. Pt with below deficits who will benefit from acute therapy to maximize mobility, gait and function to decrease burden of care. Pt with sats 89-91% on RA throughout mobility with 97% on 1.5L end of session. HR 81, no pain    PT Assessment  Patient needs continued PT services    Follow Up Recommendations  SNF;Supervision/Assistance - 24 hour    Does the patient have the potential to tolerate intense rehabilitation      Barriers to Discharge Decreased caregiver support      Equipment Recommendations  None recommended by PT    Recommendations for Other Services     Frequency Min 3X/week    Precautions / Restrictions Precautions Precautions: Fall   Pertinent Vitals/Pain See above      Mobility  Bed Mobility Bed Mobility: Rolling Left;Left Sidelying to Sit;Sitting - Scoot to Edge of Bed Rolling Left: 3: Mod assist Left Sidelying to Sit: 3: Mod assist;HOB flat Sitting - Scoot to Edge of Bed: 4: Min assist Details for Bed Mobility Assistance: cueing for sequence with assist to elevate trunk and  scoot to edge of bed Transfers Transfers: Sit to Stand;Stand to Sit Sit to Stand: 4: Min assist;From bed Stand to Sit: 4: Min guard;To chair/3-in-1 Details for Transfer Assistance: cueing for sequence and safety Ambulation/Gait Ambulation/Gait Assistance: 4: Min guard Ambulation Distance (Feet): 60 Feet Assistive device: Rolling walker Ambulation/Gait Assistance Details: cueing for posture and position in RW. Pt with tendency to maintain flexed posture with propping on RW on LUE Gait Pattern: Step-through pattern;Decreased stride length;Trunk flexed Gait velocity: decreased Stairs: No    Exercises     PT Diagnosis: Difficulty walking  PT Problem List: Decreased strength;Decreased activity tolerance;Decreased mobility;Decreased knowledge of use of DME PT Treatment Interventions: Gait training;Functional mobility training;Therapeutic activities;Therapeutic exercise;Patient/family education;DME instruction     PT Goals(Current goals can be found in the care plan section) Acute Rehab PT Goals Patient Stated Goal: be able to care for myself PT Goal Formulation: With patient/family Time For Goal Achievement: 03/03/13 Potential to Achieve Goals: Good  Visit Information  Last PT Received On: 02/17/13 Assistance Needed: +1 History of  Present Illness: 57 year old female patient with history of congestive heart failure coronary artery disease as well as chronic kidney disease. She also  has diabetes and chronic anemia related to her kidney disease. She presented to the hospital with complaints of shortness of breath and was found systolic blood pressure greater than 200. Chest x-ray was consistent with possible pulmonary edema. Patient had rapid onset of shortness of breath according to her family and no constitutional symptoms that would lead one to believe she had an infectious process. He over the past several days patient has also had increasing swelling of her extremities. She has had several CHF exacerbations over the past year and has been hospitalized.       Prior Functioning  Home Living Family/patient expects to be discharged to:: Private residence Living Arrangements: Alone Available Help at Discharge: Family;Available PRN/intermittently Type of Home: Apartment Home Access: Level entry Home Layout: One level Home Equipment: Walker - 2 wheels;Shower seat Prior Function Level of Independence: Needs assistance Gait / Transfers Assistance Needed: pt reports on good days she can walk in apt on her own with RW and others she needs assist ADL's / Homemaking Assistance Needed: pt needs asssist with bathing, dressing, pericare, housework Communication Communication: No difficulties Dominant Hand: Right    Cognition  Cognition Arousal/Alertness: Awake/alert Behavior During Therapy: WFL for tasks assessed/performed Overall Cognitive Status: Within Functional Limits for tasks assessed    Extremity/Trunk Assessment Upper Extremity Assessment Upper Extremity Assessment: Generalized weakness Lower Extremity Assessment Lower Extremity Assessment: Generalized weakness Cervical / Trunk Assessment Cervical / Trunk Assessment: Normal   Balance    End of Session PT - End of Session Activity Tolerance: Patient tolerated treatment well Patient left: in chair;with call bell/phone within reach;with family/visitor present;with nursing/sitter in room Nurse Communication:  Mobility status  GP     Delorse Lek 02/17/2013, 1:42 PM Delaney Meigs, PT [phone]

## 2013-02-17 NOTE — Plan of Care (Signed)
Problem: Food- and Nutrition-Related Knowledge Deficit (NB-1.1) Goal: Nutrition education Formal process to instruct or train a patient/client in a skill or to impart knowledge to help patients/clients voluntarily manage or modify food choices and eating behavior to maintain or improve health. Outcome: Completed/Met Date Met:  02/17/13 Nutrition Education Note  RD consulted for nutrition education regarding a Low Sodium diet.   Lipid Panel  No results found for this basename: chol, trig, hdl, cholhdl, vldl, ldlcalc    RD provided "Low Sodium Diet" handout from the [employer] of [employer]. Reviewed patient's dietary recall. Provided examples on ways to decrease sodium intake in diet. Discouraged intake of processed foods and use of salt shaker. Encouraged fresh fruits and vegetables as well as whole grain sources of carbohydrates to maximize fiber intake. Teach back method used.  Expect good compliance.  Body mass index is 43.26 kg/(m^2). Pt meets criteria for obesity grade III based on current BMI.  Current diet order is Carb Modified, patient is consuming approximately 100% of meals at this time. Labs and medications reviewed. No further nutrition interventions warranted at this time. RD contact information provided. If additional nutrition issues arise, please re-consult RD.  Ebbie Latus RD, LDN

## 2013-02-17 NOTE — Progress Notes (Signed)
[facility] Progress Note [place] TEAM 1 - Stepdown ICU Team   Madeline [name] YNW:[id number] DOB: 03-08-1956 DOA: 02/14/2013 PCP: Pcp Not In System  Brief narrative: 57 year old female patient with history of congestive heart failure coronary artery disease as well as chronic kidney disease. She also has diabetes and chronic anemia related to her kidney disease. She presented to the hospital with complaints of shortness of breath and was found systolic blood pressure greater than 200. Chest x-ray was consistent with possible pulmonary edema. Patient had rapid onset of shortness of breath according to her family and no constitutional symptoms that would lead one to believe she had an infectious process. He over the past several days patient has also had increasing swelling of her extremities. She has had several CHF exacerbations over the past year and has been hospitalized. Due to the degree of respiratory failure she did require BiPAP in the emergency department. She was also temporarily started on IV nitroglycerin and a dose of IV Lasix was given with subsequent improvement in the patient's blood pressure and breathing therefore BiPAP was weaned off. She was stable nasal cannula oxygen upon the admitting physician's examination.  Assessment/Plan: Active Problems:   Acute respiratory failure with hypoxia due to   A) Flash pulmonary edema   B) Acute diastolic CHF (congestive heart failure)-stage 3   C)  Acute right heart failure   D) Pulmonary HTN -likely multifactorial: flash edema/DHF due to uncontrolled BP, RHF from possible noncompliance with CPAP -records from [facility] MD reviewed -cont Lasix to TID-diuresing well- negative 5300 cc since admit -make sure BP adequately controlled-will increase Hydralazine to 100 and add Coreg -certainly exacerbated by acute anemia/myocardial demand    HTN (hypertension), malignant -as above-will increase Hydralazine to 100 and add Coreg    Anasarca and  ascites -possibly due poorly controlled HTN/RHF -consult  nutrition     ARF (acute renal failure) on   Chronic kidney disease (CKD), stage IV (severe) -acute component likely due to poor perfusion from marked HTN in setting of anemia -has documented CKD 3    Anemia, chronic disease/Acute blood loss anemia -hgb nadir 5.5 -hgb up to 8.7 after PRBCs -check CBC in am -EGD revealed small multiple focal antral ulcers    DM (diabetes mellitus) -CBG elevated -[facility] records reviewed- was on Lantus 15 BID and Lispro 13 meal coverage in June- will dc SSI and resume Lantus at 20 daily -consult diabetes educatior    OSA on CPAP -cont IP -don't believe she is compliant at home   DVT prophylaxis: SCDs Code Status: Full Family Communication: The patient and daughter and 2 grand daughters at bedside Disposition Plan/Expected LOS: Remain in step down Isolation: None Nutritional Status: Suspect chronic protein calorie malnutrition related to patient reports of odynophagia and adequate intake for some time  Consultants: Gastroenterology  Procedures: EGD  Stomach: Small hiatal hernia. In the prepyloric antrum of the  stomach the mucosa was very edematous-appearing with some scattered  focal ulcerations. Biopsies obtained.    2-D echocardiogram - Left ventricle: The cavity size was normal. Wall thickness was increased in a pattern of moderate to severe LVH. The estimated ejection fraction was 65%. Wall motion was normal; there were no regional wall motion abnormalities. Findings consistent with left ventricular diastolic dysfunction. Doppler parameters are consistent with high ventricular filling pressure. - Left atrium: The atrium was mildly dilated. - Right ventricle: The cavity size was moderately dilated. Systolic function was moderately reduced. - Right atrium: The atrium was  mildly dilated. - Pulmonary arteries: PA peak pressure: 66mm Hg  (S).  Antibiotics: None   HPI/Subjective: Patient alert and endorsed less back pain. No SOB   Objective: Blood pressure 215/82, pulse 74, temperature 98.5 F (36.9 C), temperature source Oral, resp. rate 20, height 4\' 9"  (1.448 m), weight 90.7 kg (199 lb 15.3 oz), SpO2 96.00%.  Intake/Output Summary (Last 24 hours) at 02/17/13 1005 Last data filed at 02/17/13 0449  Gross per 24 hour  Intake    200 ml  Output   1810 ml  Net  -1610 ml     Exam: General: No acute respiratory distress Lungs: Clear to auscultation bilaterally without wheezes or crackles, RA Cardiovascular: Regular rate and rhythm without murmur gallop or rub normal S1 and S2, positive peripheral edema more marked in upper extremities but without JVD; anasarca is present Abdomen: Nontender, nondistended, soft, bowel sounds positive, no rebound, no ascites, no appreciable mass Musculoskeletal: No significant cyanosis, clubbing of bilateral lower extremities Neurological: Alert and oriented x 3, moves all extremities x 4 without focal neurological deficits, CN 2-12 intact  Scheduled Meds:  Scheduled Meds: . aspirin EC  81 mg Oral Daily  . carvedilol  20 mg Oral Daily  . ferrous sulfate  325 mg Oral Q breakfast  . furosemide  40 mg Intravenous Q8H  . gabapentin  300 mg Oral BID  . hydrALAZINE  100 mg Oral TID  . insulin aspart  0-5 Units Subcutaneous QHS  . insulin aspart  10 Units Subcutaneous TID WC  . insulin glargine  20 Units Subcutaneous QHS  . isosorbide mononitrate  120 mg Oral Daily  . NIFEdipine  60 mg Oral Daily  . pantoprazole  40 mg Oral BID  . sodium chloride  3 mL Intravenous Q12H  . sodium chloride  3 mL Intravenous Q12H   Continuous Infusions:    **Reviewed in detail by the Attending Physician  Data Reviewed: Basic Metabolic Panel:  Recent Labs Lab 02/14/13 0028 02/14/13 1853 02/15/13 0602 02/16/13 1648  NA 138 135 138 139  K 5.1 5.5* 4.8 5.0  CL 106 103 108 105  CO2 22 22 22  24   GLUCOSE 328* 527* 167* 163*  BUN 34* 32* 33* 38*  CREATININE 2.59* 2.56* 2.46* 2.44*  CALCIUM 7.5* 7.8* 7.5* 8.2*   Liver Function Tests:  Recent Labs Lab 02/14/13 1853  AST 42*  ALT 17  ALKPHOS 197*  BILITOT 0.1*  PROT 6.3  ALBUMIN 1.9*   No results found for this basename: LIPASE, AMYLASE,  in the last 168 hours No results found for this basename: AMMONIA,  in the last 168 hours CBC:  Recent Labs Lab 02/14/13 0028 02/14/13 1853 02/15/13 0602 02/15/13 2044 02/17/13 0420  WBC 10.2 9.9 9.5 10.4 9.3  NEUTROABS  --  8.1*  --   --   --   HGB 6.1* 7.0* 5.5* 8.7* 8.1*  HCT 18.8* 21.6* 16.3* 25.8* 24.2*  MCV 88.7 90.0 88.6 86.3 87.7  PLT 209 232 171 184 181   Cardiac Enzymes:  Recent Labs Lab 02/14/13 2333 02/15/13 0602 02/15/13 1508  TROPONINI <0.30 <0.30 <0.30   BNP (last 3 results)  Recent Labs  04/05/12 1307 02/14/13 1853 02/14/13 2333  PROBNP 4578.0* 7354.0* 8501.0*   CBG:  Recent Labs Lab 02/15/13 2100 02/16/13 0824 02/16/13 1728 02/16/13 2135 02/17/13 0822  GLUCAP 186* 206* 190* 269* 334*    Recent Results (from the past 240 hour(s))  URINE CULTURE     Status:  None   Collection Time    02/14/13  8:18 PM      Result Value Range Status   Specimen Description URINE, CATHETERIZED   Final   Special Requests Normal   Final   Culture  Setup Time     Final   Value: 02/15/2013 02:50     Performed at [facility] Count     Final   Value: NO GROWTH     Performed at [facility]   Culture     Final   Value: NO GROWTH     Performed at [facility]   Report Status 02/16/2013 FINAL   Final  MRSA PCR SCREENING     Status: None   Collection Time    02/14/13 10:53 PM      Result Value Range Status   MRSA by PCR NEGATIVE  NEGATIVE Final   Comment:            The GeneXpert MRSA Assay (FDA     approved for NASAL specimens     only), is one component of a     comprehensive MRSA colonization     surveillance  program. It is not     intended to diagnose MRSA     infection nor to guide or     monitor treatment for     MRSA infections.     Studies:  Recent x-ray studies have been reviewed in detail by the Attending Physician    Junious Silk, ANP [facility] Office  [phone] Pager [phone]  **If unable to reach the above provider after paging please contact the Flow Manager @ [phone]  On-Call/Text [place]:      [email or web address]      password [email or web address]  If 7PM-7AM, please contact night-coverage [email or web address] Password [email or web address] 02/17/2013, 10:05 AM   LOS: 3 days   I have examined the patient, reviewed the chart and modified the above note which I agree with.   Linsi Humann,MD [phone] 02/17/2013, 12:37 PM

## 2013-02-17 NOTE — Progress Notes (Addendum)
Inpatient Diabetes Program Recommendations  AACE/ADA: New Consensus Statement on Inpatient Glycemic Control (2013)  Target Ranges:  Prepandial:   less than 140 mg/dL      Peak postprandial:   less than 180 mg/dL (1-2 hours)      Critically ill patients:  140 - 180 mg/dL     **Received referral for this patient.  A1c was 7.7% on admission but question accuracy as hemoglobin level was 6.1 on admission.  **Spoke with patient and her daughter about patient's home insulin regimen.  Patient and her daughter told me that patient is supposed to take Lantus 15 units QHS if her CBG is >120 mg/dl.  Patient also is supposed to be taking Humalog with meals at home.  Per patient and daughter, patient usually takes 13 units tid with meals, but will increase or decrease doses depending on her meal intake.  Patient's daughter told me that patient just recently moved to [facility] from [facility] and has established care with Dr. Benedetto Goad here in [place].  Patient saw Dr. Andrey Campanile in June and has a follow-up appointment with Dr. Andrey Campanile on 10/17.  Per patient, she checks her CBGs ~4 times per day and does not have any issues taking her insulin.  Reminded patient and her daughter to rotate insulin injection sites and to keep a CBG diary for her PCP.  Encouraged patient to limit the amount of Glucerna shakes she drinks per day and encouraged patient to limit the amount of fruit juice she drinks.  **Per patient's daughter, patient now has Medicaid coverage  **MD- Please consider adding back Novolog Sensitive SSI to regimen.  Noted Novolog 10 units tid with meals and Lantus 20 units QHS started today.   Will follow. Ambrose Finland RN, MSN, CDE Diabetes Coordinator Inpatient Diabetes Program Team Pager: [phone] (8a-10p)

## 2013-02-18 LAB — CBC
Hemoglobin: 7.8 g/dL — ABNORMAL LOW (ref 12.0–15.0)
MCV: 86.3 fL (ref 78.0–100.0)
Platelets: 196 10*3/uL (ref 150–400)
RBC: 2.71 MIL/uL — ABNORMAL LOW (ref 3.87–5.11)
RDW: 15.1 % (ref 11.5–15.5)
WBC: 7.8 10*3/uL (ref 4.0–10.5)

## 2013-02-18 LAB — GLUCOSE, CAPILLARY
Glucose-Capillary: 53 mg/dL — ABNORMAL LOW (ref 70–99)
Glucose-Capillary: 113 mg/dL — ABNORMAL HIGH (ref 70–99)
Glucose-Capillary: 257 mg/dL — ABNORMAL HIGH (ref 70–99)

## 2013-02-18 LAB — COMPREHENSIVE METABOLIC PANEL
ALT: 12 U/L (ref 0–35)
AST: 26 U/L (ref 0–37)
Albumin: 1.3 g/dL — ABNORMAL LOW (ref 3.5–5.2)
Alkaline Phosphatase: 94 U/L (ref 39–117)
CO2: 26 mEq/L (ref 19–32)
Chloride: 108 mEq/L (ref 96–112)
Creatinine, Ser: 2.41 mg/dL — ABNORMAL HIGH (ref 0.50–1.10)
GFR calc non Af Amer: 21 mL/min — ABNORMAL LOW (ref >90)
Glucose, Bld: 61 mg/dL — ABNORMAL LOW (ref 70–99)
Potassium: 4.1 mEq/L (ref 3.5–5.1)
Total Bilirubin: <0.1 mg/dL — ABNORMAL LOW (ref 0.3–1.2)

## 2013-02-18 LAB — TYPE AND SCREEN
Antibody Screen: POSITIVE
Donor AG Type: NEGATIVE
Unit division: 0
Unit division: 0
Unit division: 0

## 2013-02-18 LAB — IRON AND TIBC
Iron: 29 ug/dL — ABNORMAL LOW (ref 42–135)
TIBC: 169 ug/dL — ABNORMAL LOW (ref 250–470)

## 2013-02-18 LAB — FOLATE: Folate: 11.3 ng/mL

## 2013-02-18 LAB — LACTATE DEHYDROGENASE: LDH: 251 U/L — ABNORMAL HIGH (ref 94–250)

## 2013-02-18 LAB — HAPTOGLOBIN: Haptoglobin: 171 mg/dL (ref 45–215)

## 2013-02-18 LAB — RETICULOCYTES
RBC.: 2.79 MIL/uL — ABNORMAL LOW (ref 3.87–5.11)
Retic Count, Absolute: 39.1 10*3/uL (ref 19.0–186.0)

## 2013-02-18 LAB — VITAMIN B12: Vitamin B-12: 466 pg/mL (ref 211–911)

## 2013-02-18 MED ORDER — INFLUENZA VAC SPLIT QUAD 0.5 ML IM SUSP
0.5000 mL | INTRAMUSCULAR | Status: AC
  Administered 2013-02-19: 0.5 mL via INTRAMUSCULAR
  Filled 2013-02-18: qty 0.5

## 2013-02-18 MED ORDER — INSULIN GLARGINE 100 UNIT/ML ~~LOC~~ SOLN
16.0000 [IU] | Freq: Every day | SUBCUTANEOUS | Status: DC
  Administered 2013-02-18 – 2013-02-21 (×4): 16 [IU] via SUBCUTANEOUS
  Filled 2013-02-18 (×4): qty 0.16

## 2013-02-18 NOTE — Progress Notes (Signed)
Hypoglycemic Event  CBG: 62  Treatment: 15 GM carbohydrate snack  Symptoms: None  Follow-up CBG: Time: 0800 CBG Result:76  Possible Reasons for Event: Unknown  Comments/MD notified:     Madeline [name]  Remember to initiate Hypoglycemia Order Set & complete

## 2013-02-18 NOTE — Progress Notes (Signed)
[facility] Progress Note [place] TEAM 1 - Stepdown ICU Team   Madeline [name] UJW:[id number] DOB: 1955/10/03 DOA: 02/14/2013 PCP: Pcp Not In System  Brief narrative: 57 year old female patient with history of congestive heart failure coronary artery disease as well as chronic kidney disease. She also has diabetes and chronic anemia related to her kidney disease. She presented to the hospital with complaints of shortness of breath and was found systolic blood pressure greater than 200. Chest x-ray was consistent with possible pulmonary edema. Patient had rapid onset of shortness of breath according to her family and no constitutional symptoms that would lead one to believe she had an infectious process. He over the past several days patient has also had increasing swelling of her extremities. She has had several CHF exacerbations over the past year and has been hospitalized. Due to the degree of respiratory failure she did require BiPAP in the emergency department. She was also temporarily started on IV nitroglycerin and a dose of IV Lasix was given with subsequent improvement in the patient's blood pressure and breathing therefore BiPAP was weaned off. She was stable nasal cannula oxygen upon the admitting physician's examination.  Assessment/Plan: Active Problems:   Acute respiratory failure with hypoxia due to   A) Flash pulmonary edema   B) Acute diastolic CHF (congestive heart failure)-stage 3   C)  Acute right heart failure   D) Pulmonary HTN -likely multifactorial: flash edema/DHF due to uncontrolled BP, RHF from possible noncompliance with CPAP -records from [facility] MD reviewed -cont Lasix to TID-diuresing well- negative 8600 cc since admit -make sure BP adequately controlled-have increased Hydralazine to 100 and add Coreg with good results- will not add further medication for now and allow further titration as outpt    HTN (hypertension), malignant -as above-Increased Hydralazine to  100 and added Coreg with noted improvement    Anasarca and ascites/ rt heart failure -possibly due poorly controlled HTN/RHF - cont to diurese as much as possible and increase Lasix to BID on d/c - recommend HHRN to follow weights and continue teaching at home-     ARF (acute renal failure) on   Chronic kidney disease (CKD), stage IV (severe) -acute component likely due to poor perfusion from marked HTN in setting of anemia -follow with diuresis     Anemia, chronic disease/Acute blood loss anemia -hgb nadir 5.5- 2 U prbcs given -hgb steadily declining again despite diuresis- will check haptoglobin and ldh -no bleeding noted -EGD revealed small multiple focal antral ulcers - on iron as outpt- f/u anemia panel    DM (diabetes mellitus) -A1c 7.7 - med rec states BID Lantus and therefore I gave 20 U lantus last night- now note per diabetes coordinators note, she was only on 15 QHS- will drop down to 15 - cont novolog with meals without correction scale as it will likely make her hypoglycemic     OSA on CPAP -cont    DVT prophylaxis: SCDs Code Status: Full Family Communication: The patient and daughter and 2 grand daughters at bedside on 10/10 Disposition Plan/Expected LOS: transfer to tele  Consultants: Gastroenterology  Procedures: EGD  Stomach: Small hiatal hernia. In the prepyloric antrum of the  stomach the mucosa was very edematous-appearing with some scattered  focal ulcerations. Biopsies obtained.    2-D echocardiogram - Left ventricle: The cavity size was normal. Wall thickness was increased in a pattern of moderate to severe LVH. The estimated ejection fraction was 65%. Wall motion was normal; there were no  regional wall motion abnormalities. Findings consistent with left ventricular diastolic dysfunction. Doppler parameters are consistent with high ventricular filling pressure. - Left atrium: The atrium was mildly dilated. - Right ventricle: The cavity size  was moderately dilated. Systolic function was moderately reduced. - Right atrium: The atrium was mildly dilated. - Pulmonary arteries: PA peak pressure: 66mm Hg (S).  Antibiotics: None   HPI/Subjective: No complaints today.    Objective: Blood pressure 171/68, pulse 75, temperature 98.4 F (36.9 C), temperature source Axillary, resp. rate 12, height 4\' 9"  (1.448 m), weight 90.7 kg (199 lb 15.3 oz), SpO2 95.00%.  Intake/Output Summary (Last 24 hours) at 02/18/13 1128 Last data filed at 02/18/13 1000  Gross per 24 hour  Intake     63 ml  Output   3425 ml  Net  -3362 ml     Exam: General: No acute respiratory distress Lungs: Clear to auscultation bilaterally without wheezes or crackles, RA Cardiovascular: Regular rate and rhythm without murmur gallop or rub normal S1 and S2, positive peripheral edema more marked in upper extremities but without JVD; anasarca is present Abdomen: Nontender, nondistended, soft, bowel sounds positive, no rebound, no ascites, no appreciable mass Musculoskeletal: No significant cyanosis, clubbing of bilateral lower extremities Neurological: Alert and oriented x 3, moves all extremities x 4 without focal neurological deficits, CN 2-12 intact  Scheduled Meds:  Scheduled Meds: . aspirin EC  81 mg Oral Daily  . carvedilol  20 mg Oral Daily  . ferrous sulfate  325 mg Oral Q breakfast  . furosemide  40 mg Intravenous Q8H  . gabapentin  300 mg Oral BID  . hydrALAZINE  100 mg Oral TID  . insulin aspart  0-5 Units Subcutaneous QHS  . insulin aspart  10 Units Subcutaneous TID WC  . insulin glargine  16 Units Subcutaneous QHS  . isosorbide mononitrate  120 mg Oral Daily  . NIFEdipine  60 mg Oral Daily  . pantoprazole  40 mg Oral BID  . sodium chloride  3 mL Intravenous Q12H  . sodium chloride  3 mL Intravenous Q12H   Continuous Infusions:    **Reviewed in detail by the Attending Physician  Data Reviewed: Basic Metabolic Panel:  Recent Labs Lab  02/14/13 1853 02/15/13 0602 02/16/13 1648 02/17/13 1053 02/18/13 0500  NA 135 138 139 138 141  K 5.5* 4.8 5.0 4.9 4.1  CL 103 108 105 105 108  CO2 22 22 24 23 26   GLUCOSE 527* 167* 163* 337* 61*  BUN 32* 33* 38* 40* 43*  CREATININE 2.56* 2.46* 2.44* 2.40* 2.41*  CALCIUM 7.8* 7.5* 8.2* 7.9* 8.0*   Liver Function Tests:  Recent Labs Lab 02/14/13 1853 02/18/13 0500  AST 42* 26  ALT 17 12  ALKPHOS 197* 94  BILITOT 0.1* <0.1*  PROT 6.3 5.2*  ALBUMIN 1.9* 1.3*   No results found for this basename: LIPASE, AMYLASE,  in the last 168 hours No results found for this basename: AMMONIA,  in the last 168 hours CBC:  Recent Labs Lab 02/14/13 0028 02/14/13 1853 02/15/13 0602 02/15/13 2044 02/17/13 0420 02/18/13 0500  WBC 10.2 9.9 9.5 10.4 9.3 7.8  NEUTROABS  --  8.1*  --   --   --   --   HGB 6.1* 7.0* 5.5* 8.7* 8.1* 7.8*  HCT 18.8* 21.6* 16.3* 25.8* 24.2* 23.4*  MCV 88.7 90.0 88.6 86.3 87.7 86.3  PLT 209 232 171 184 181 196   Cardiac Enzymes:  Recent Labs Lab 02/14/13 2333  02/15/13 0602 02/15/13 1508  TROPONINI <0.30 <0.30 <0.30   BNP (last 3 results)  Recent Labs  04/05/12 1307 02/14/13 1853 02/14/13 2333  PROBNP 4578.0* 7354.0* 8501.0*   CBG:  Recent Labs Lab 02/17/13 1312 02/17/13 1618 02/17/13 2137 02/18/13 0719 02/18/13 0801  GLUCAP 301* 183* 91 62* 76    Recent Results (from the past 240 hour(s))  URINE CULTURE     Status: None   Collection Time    02/14/13  8:18 PM      Result Value Range Status   Specimen Description URINE, CATHETERIZED   Final   Special Requests Normal   Final   Culture  Setup Time     Final   Value: 02/15/2013 02:50     Performed at [facility] Count     Final   Value: NO GROWTH     Performed at [facility]   Culture     Final   Value: NO GROWTH     Performed at [facility]   Report Status 02/16/2013 FINAL   Final  MRSA PCR SCREENING     Status: None   Collection Time     02/14/13 10:53 PM      Result Value Range Status   MRSA by PCR NEGATIVE  NEGATIVE Final   Comment:            The GeneXpert MRSA Assay (FDA     approved for NASAL specimens     only), is one component of a     comprehensive MRSA colonization     surveillance program. It is not     intended to diagnose MRSA     infection nor to guide or     monitor treatment for     MRSA infections.     Studies:  Recent x-ray studies have been reviewed in detail by the Attending Physician     Candida Vetter,MD [phone] 02/18/2013, 11:28 AM  On-Call/Text [place]:      [email or web address]      password [email or web address]  If 7PM-7AM, please contact night-coverage [email or web address] Password [email or web address] 02/18/2013, 11:28 AM   LOS: 4 days

## 2013-02-18 NOTE — Progress Notes (Signed)
Pt was placed on Auto CPAP with a full face mask. Pt is comfortable. RT will continue to monitor.

## 2013-02-19 LAB — BASIC METABOLIC PANEL
BUN: 47 mg/dL — ABNORMAL HIGH (ref 6–23)
GFR calc non Af Amer: 21 mL/min — ABNORMAL LOW (ref >90)
Glucose, Bld: 183 mg/dL — ABNORMAL HIGH (ref 70–99)
Potassium: 3.8 mEq/L (ref 3.5–5.1)

## 2013-02-19 LAB — GLUCOSE, CAPILLARY
Glucose-Capillary: 155 mg/dL — ABNORMAL HIGH (ref 70–99)
Glucose-Capillary: 109 mg/dL — ABNORMAL HIGH (ref 70–99)
Glucose-Capillary: 92 mg/dL (ref 70–99)
Glucose-Capillary: 124 mg/dL — ABNORMAL HIGH (ref 70–99)

## 2013-02-19 LAB — CBC
HCT: 23.5 % — ABNORMAL LOW (ref 36.0–46.0)
Hemoglobin: 7.9 g/dL — ABNORMAL LOW (ref 12.0–15.0)
MCHC: 33.6 g/dL (ref 30.0–36.0)
Platelets: 203 10*3/uL (ref 150–400)
RDW: 14.9 % (ref 11.5–15.5)

## 2013-02-19 MED ORDER — SODIUM CHLORIDE 0.9 % IV SOLN
500.0000 mg | Freq: Once | INTRAVENOUS | Status: AC
  Administered 2013-02-19: 500 mg via INTRAVENOUS
  Filled 2013-02-19 (×2): qty 10

## 2013-02-19 MED ORDER — INSULIN ASPART 100 UNIT/ML ~~LOC~~ SOLN
13.0000 [IU] | Freq: Three times a day (TID) | SUBCUTANEOUS | Status: DC
  Administered 2013-02-19 – 2013-02-23 (×10): 13 [IU] via SUBCUTANEOUS

## 2013-02-19 MED ORDER — SODIUM CHLORIDE 0.9 % IV SOLN
25.0000 mg | Freq: Once | INTRAVENOUS | Status: AC
  Administered 2013-02-19: 25 mg via INTRAVENOUS
  Filled 2013-02-19: qty 0.5

## 2013-02-19 NOTE — Progress Notes (Signed)
[facility] Progress Note [employer] TEAM 1 - Stepdown ICU Team   Nafeesa Otani XBJ:[id number] DOB: 14-Aug-1955 DOA: 02/14/2013 PCP: Pcp Not In System  Brief narrative: 57 year old female patient with history of congestive heart failure coronary artery disease as well as chronic kidney disease. She also has diabetes and chronic anemia related to her kidney disease. She presented to the hospital with complaints of shortness of breath and was found systolic blood pressure greater than 200. Chest x-ray was consistent with possible pulmonary edema. Patient had rapid onset of shortness of breath according to her family and no constitutional symptoms that would lead one to believe she had an infectious process. He over the past several days patient has also had increasing swelling of her extremities. She has had several CHF exacerbations over the past year and has been hospitalized. Due to the degree of respiratory failure she did require BiPAP in the emergency department. She was also temporarily started on IV nitroglycerin and a dose of IV Lasix was given with subsequent improvement in the patient's blood pressure and breathing therefore BiPAP was weaned off. She was stable nasal cannula oxygen upon the admitting physician's examination.  Assessment/Plan: Active Problems:   Acute respiratory failure with hypoxia due to   A) Flash pulmonary edema due to malingnant HTN   B) Acute diastolic CHF (congestive heart failure)-stage 3   C)  Acute right heart failure   D) Pulmonary HTN -likely multifactorial: flash edema/DHF due to uncontrolled BP, RHF from possible noncompliance with CPAP -records from [facility] ([facility] MD reviewed -cont Lasix to TID-diuresing well- negative 9300 cc since admit -make sure BP adequately controlled-have increased Hydralazine to 100 and add Coreg with good results- will not add further medication for now and allow further titration as outpt    HTN (hypertension), malignant -as  above-Increased Hydralazine to 100 and added Coreg with noted improvement    Anasarca and ascites/ rt heart failure -possibly due poorly controlled HTN/RHF - cont to diurese as much as possible and increase oral Lasix to BID on d/c - recommend HHRN to follow weights and continue teaching at home-     ARF (acute renal failure) on   Chronic kidney disease (CKD), stage IV (severe) -acute component likely due to poor perfusion from marked HTN in setting of anemia -follow with diuresis     Iron deficiency and Anemia, chronic disease/Acute blood loss anemia -hgb nadir 5.5- 2 U prbcs given -hgb steadily declining again despite diuresis- will check haptoglobin and ldh -no bleeding noted -EGD revealed small multiple focal antral ulcers - will give one dose of IV Iron on iron as outpt- low Iron levels on anemia panel    DM (diabetes mellitus) -A1c 7.7 - med rec states BID Lantus and therefore I gave 20 U lantus last night- now note per diabetes coordinators note, she was only on 15 QHS- will drop down to 15 - cont novolog with meals without correction scale as it will likely make her hypoglycemic     OSA on CPAP -cont    DVT prophylaxis: SCDs Code Status: Full Family Communication: The patient and daughter and 2 grand daughters at bedside on 10/10 Disposition Plan/Expected LOS: transfer to tele  Consultants: Gastroenterology  Procedures: EGD  Stomach: Small hiatal hernia. In the prepyloric antrum of the  stomach the mucosa was very edematous-appearing with some scattered  focal ulcerations. Biopsies obtained.    2-D echocardiogram - Left ventricle: The cavity size was normal. Wall thickness was increased in a pattern  of moderate to severe LVH. The estimated ejection fraction was 65%. Wall motion was normal; there were no regional wall motion abnormalities. Findings consistent with left ventricular diastolic dysfunction. Doppler parameters are consistent with high ventricular  filling pressure. - Left atrium: The atrium was mildly dilated. - Right ventricle: The cavity size was moderately dilated. Systolic function was moderately reduced. - Right atrium: The atrium was mildly dilated. - Pulmonary arteries: PA peak pressure: 66mm Hg (S).  Antibiotics: None   HPI/Subjective: No complaints today.    Objective: Blood pressure 174/83, pulse 63, temperature 98.2 F (36.8 C), temperature source Axillary, resp. rate 17, height 4\' 9"  (1.448 m), weight 90.7 kg (199 lb 15.3 oz), SpO2 95.00%.  Intake/Output Summary (Last 24 hours) at 02/19/13 1011 Last data filed at 02/19/13 0441  Gross per 24 hour  Intake    940 ml  Output   1750 ml  Net   -810 ml     Exam: General: No acute respiratory distress Lungs: Clear to auscultation bilaterally without wheezes or crackles, RA Cardiovascular: Regular rate and rhythm without murmur gallop or rub normal S1 and S2, positive peripheral edema more marked in upper extremities but without JVD; anasarca is present Abdomen: Nontender, nondistended, soft, bowel sounds positive, no rebound, no ascites, no appreciable mass Musculoskeletal: No significant cyanosis, clubbing of bilateral lower extremities Neurological: Alert and oriented x 3, moves all extremities x 4 without focal neurological deficits, CN 2-12 intact  Scheduled Meds:  Scheduled Meds: . aspirin EC  81 mg Oral Daily  . carvedilol  20 mg Oral Daily  . ferrous sulfate  325 mg Oral Q breakfast  . furosemide  40 mg Intravenous Q8H  . gabapentin  300 mg Oral BID  . hydrALAZINE  100 mg Oral TID  . influenza vac split quadrivalent PF  0.5 mL Intramuscular Tomorrow-1000  . insulin aspart  0-5 Units Subcutaneous QHS  . insulin aspart  13 Units Subcutaneous TID WC  . insulin glargine  16 Units Subcutaneous QHS  . isosorbide mononitrate  120 mg Oral Daily  . NIFEdipine  60 mg Oral Daily  . pantoprazole  40 mg Oral BID  . sodium chloride  3 mL Intravenous Q12H  .  sodium chloride  3 mL Intravenous Q12H   Continuous Infusions:    **Reviewed in detail by the Attending Physician  Data Reviewed: Basic Metabolic Panel:  Recent Labs Lab 02/15/13 0602 02/16/13 1648 02/17/13 1053 02/18/13 0500 02/19/13 0400  NA 138 139 138 141 138  K 4.8 5.0 4.9 4.1 3.8  CL 108 105 105 108 104  CO2 22 24 23 26 24   GLUCOSE 167* 163* 337* 61* 183*  BUN 33* 38* 40* 43* 47*  CREATININE 2.46* 2.44* 2.40* 2.41* 2.44*  CALCIUM 7.5* 8.2* 7.9* 8.0* 7.7*   Liver Function Tests:  Recent Labs Lab 02/14/13 1853 02/18/13 0500  AST 42* 26  ALT 17 12  ALKPHOS 197* 94  BILITOT 0.1* <0.1*  PROT 6.3 5.2*  ALBUMIN 1.9* 1.3*   No results found for this basename: LIPASE, AMYLASE,  in the last 168 hours No results found for this basename: AMMONIA,  in the last 168 hours CBC:  Recent Labs Lab 02/14/13 0028 02/14/13 1853 02/15/13 0602 02/15/13 2044 02/17/13 0420 02/18/13 0500 02/19/13 0400  WBC 10.2 9.9 9.5 10.4 9.3 7.8 6.5  NEUTROABS  --  8.1*  --   --   --   --   --   HGB 6.1* 7.0* 5.5* 8.7*  8.1* 7.8* 7.9*  HCT 18.8* 21.6* 16.3* 25.8* 24.2* 23.4* 23.5*  MCV 88.7 90.0 88.6 86.3 87.7 86.3 87.4  PLT 209 232 171 184 181 196 203   Cardiac Enzymes:  Recent Labs Lab 02/14/13 2333 02/15/13 0602 02/15/13 1508  TROPONINI <0.30 <0.30 <0.30   BNP (last 3 results)  Recent Labs  04/05/12 1307 02/14/13 1853 02/14/13 2333  PROBNP 4578.0* 7354.0* 8501.0*   CBG:  Recent Labs Lab 02/18/13 1649 02/18/13 2046 02/19/13 0114 02/19/13 0441 02/19/13 0823  GLUCAP 113* 257* 259* 155* 109*    Recent Results (from the past 240 hour(s))  URINE CULTURE     Status: None   Collection Time    02/14/13  8:18 PM      Result Value Range Status   Specimen Description URINE, CATHETERIZED   Final   Special Requests Normal   Final   Culture  Setup Time     Final   Value: 02/15/2013 02:50     Performed at [facility] Count     Final   Value: NO  GROWTH     Performed at [facility]   Culture     Final   Value: NO GROWTH     Performed at [facility]   Report Status 02/16/2013 FINAL   Final  MRSA PCR SCREENING     Status: None   Collection Time    02/14/13 10:53 PM      Result Value Range Status   MRSA by PCR NEGATIVE  NEGATIVE Final   Comment:            The GeneXpert MRSA Assay (FDA     approved for NASAL specimens     only), is one component of a     comprehensive MRSA colonization     surveillance program. It is not     intended to diagnose MRSA     infection nor to guide or     monitor treatment for     MRSA infections.     Studies:  Recent x-ray studies have been reviewed in detail by the Attending Physician     Saniya Tranchina,MD [phone] 02/18/2013, 11:28 AM  On-Call/Text [place]:      [email or web address]      password [email or web address]  If 7PM-7AM, please contact night-coverage [email or web address] Password [email or web address] 02/19/2013, 10:11 AM   LOS: 5 days

## 2013-02-19 NOTE — Progress Notes (Signed)
Patient placed on CPAP and is tolerating well at this time.  RT will continue to monitor.

## 2013-02-20 LAB — GLUCOSE, CAPILLARY
Glucose-Capillary: 114 mg/dL — ABNORMAL HIGH (ref 70–99)
Glucose-Capillary: 103 mg/dL — ABNORMAL HIGH (ref 70–99)
Glucose-Capillary: 133 mg/dL — ABNORMAL HIGH (ref 70–99)
Glucose-Capillary: 244 mg/dL — ABNORMAL HIGH (ref 70–99)

## 2013-02-20 MED ORDER — FUROSEMIDE 10 MG/ML IJ SOLN
40.0000 mg | Freq: Two times a day (BID) | INTRAMUSCULAR | Status: DC
  Administered 2013-02-21 – 2013-02-22 (×3): 40 mg via INTRAVENOUS
  Filled 2013-02-20 (×4): qty 4

## 2013-02-20 MED ORDER — GABAPENTIN 100 MG PO CAPS
100.0000 mg | ORAL_CAPSULE | Freq: Two times a day (BID) | ORAL | Status: DC
  Administered 2013-02-20 – 2013-03-05 (×26): 100 mg via ORAL
  Filled 2013-02-20 (×27): qty 1

## 2013-02-20 NOTE — Progress Notes (Signed)
Pt arrived to floor via w/c from 2600.  A&0x3.  Noted general weakness.  Assisted to recliner.  Oriented to room and instructed to call for assist.  Verbalized understanding.  Will continue to monitor.Amanda Pea, Charity fundraiser.

## 2013-02-20 NOTE — Progress Notes (Addendum)
[facility] Progress Note [place] TEAM 1 - Stepdown ICU Team   Madeline [name] ZOX:[id number] DOB: 31-Mar-1956 DOA: 02/14/2013 PCP: Pcp Not In System  Brief narrative: 57 year old female patient with history of congestive heart failure coronary artery disease as well as chronic kidney disease. She also has diabetes and chronic anemia related to her kidney disease. She presented to the hospital with complaints of shortness of breath and was found to have systolic blood pressure greater than 200. Chest x-ray was consistent with possible pulmonary edema. Patient had rapid onset of shortness of breath according to her family and no constitutional symptoms that would lead one to believe she had an infectious process. Over the past several days prior to admission the patient also had increasing swelling of her extremities. She has had several CHF exacerbations over the past year and has been hospitalized. Due to the degree of respiratory failure she did require BiPAP in the emergency department. She was also temporarily started on IV nitroglycerin and a dose of IV Lasix was given with subsequent improvement in the patient's blood pressure and breathing therefore BiPAP was weaned off.   Assessment/Plan:    Acute respiratory failure with hypoxia due to   A) Flash pulmonary edema due to malingnant HTN   B) Acute diastolic CHF (congestive heart failure)-stage 3   C)  Acute right heart failure   D) Pulmonary HTN -likely multifactorial: flash edema/DHF due to uncontrolled BP, RHF from possible noncompliance with CPAP -records from [facility] MD reviewed -cont Lasix TID-diuresing well- negative 10,300 cc since admit -make sure BP adequately controlled-have increased Hydralazine to 100 and add Coreg with good results- will not add further medication for now and allow further titration as outpt-SBP more stable in the 170's now    HTN (hypertension), malignant -as above-Increased Hydralazine to 100 and added Coreg  with noted improvement    Anasarca and ascites - due to poorly controlled HTN/RHF/inadequately tx'd OSA - cont to diurese as much as possible and increase oral Lasix to BID on d/c - recommend HHRN to follow weights and continue teaching at home-    ARF (acute renal failure) on Chronic kidney disease (CKD), stage IV (severe) -acute component likely due to poor perfusion from marked HTN in setting of anemia -follow with diuresis     Iron deficiency and Anemia, chronic disease/Acute blood loss anemia -hgb nadir 5.5- 2 U prbcs given -stool was guiac positive, prompting GI consult in setting of anemia  -hgb appears to be stabilizing at ~8 -EGD revealed small multiple focal antral ulcers -given one dose of IV Iron - on iron as outpt - low Iron levels on anemia panel    DM (diabetes mellitus) - A1c 7.7 - CBGs remain stable - cont novolog with meals without correction scale as it will likely make her hypoglycemic    OSA on CPAP -cont CPAP -pt endorsed previously used CPAP in [place] but it "ran out" and she had not pursued obtaining a machine since moving to [place] -CM made aware of need for machine at dc and HH face to face completed   DVT prophylaxis: SCDs Code Status: Full Family Communication: No family present at time of exam today Disposition Plan/Expected LOS: transfer to floor - continue diuresis - begin PT/OT  Consultants: Gastroenterology  Procedures: EGD  Stomach: Small hiatal hernia. In the prepyloric antrum of the  stomach the mucosa was very edematous-appearing with some scattered  focal ulcerations. Biopsies obtained.    2-D echocardiogram - Left ventricle:  The cavity size was normal. Wall thickness was increased in a pattern of moderate to severe LVH. The estimated ejection fraction was 65%. Wall motion was normal; there were no regional wall motion abnormalities. Findings consistent with left ventricular diastolic dysfunction. Doppler parameters are consistent with  high ventricular filling pressure. - Left atrium: The atrium was mildly dilated. - Right ventricle: The cavity size was moderately dilated. Systolic function was moderately reduced. - Right atrium: The atrium was mildly dilated. - Pulmonary arteries: PA peak pressure: 66mm Hg (S).  Antibiotics: None  HPI/Subjective: No complaints today.  Feels that her breathing is slightly improved.  Denies chest pain or abdominal pain.  Objective: Blood pressure 181/74, pulse 77, temperature 99.3 F (37.4 C), temperature source Oral, resp. rate 18, height 4\' 9"  (1.448 m), weight 90.5 kg (199 lb 8.3 oz), SpO2 96.00%.  Intake/Output Summary (Last 24 hours) at 02/20/13 1240 Last data filed at 02/20/13 1134  Gross per 24 hour  Intake   1103 ml  Output   1877 ml  Net   -774 ml   Exam: General: No acute respiratory distress Lungs: Distant breath sounds in all fields with mild bibasilar crackles but no wheeze Cardiovascular: Regular rate and rhythm without murmur gallop or rub normal S1 and S2, positive peripheral edema more marked in upper extremities; 1+ anasarca is present Abdomen: Nontender, nondistended, soft, bowel sounds positive, no rebound, no ascites, no appreciable mass Musculoskeletal: No significant cyanosis, clubbing of bilateral lower extremities Neurological: Alert and oriented x 3, moves all extremities x 4 without focal neurological deficits, CN 2-12 intact  Scheduled Meds:  Scheduled Meds: . aspirin EC  81 mg Oral Daily  . carvedilol  20 mg Oral Daily  . ferrous sulfate  325 mg Oral Q breakfast  . furosemide  40 mg Intravenous Q8H  . gabapentin  300 mg Oral BID  . hydrALAZINE  100 mg Oral TID  . insulin aspart  0-5 Units Subcutaneous QHS  . insulin aspart  13 Units Subcutaneous TID WC  . insulin glargine  16 Units Subcutaneous QHS  . isosorbide mononitrate  120 mg Oral Daily  . NIFEdipine  60 mg Oral Daily  . pantoprazole  40 mg Oral BID  . sodium chloride  3 mL  Intravenous Q12H  . sodium chloride  3 mL Intravenous Q12H    Data Reviewed: Basic Metabolic Panel:  Recent Labs Lab 02/15/13 0602 02/16/13 1648 02/17/13 1053 02/18/13 0500 02/19/13 0400  NA 138 139 138 141 138  K 4.8 5.0 4.9 4.1 3.8  CL 108 105 105 108 104  CO2 22 24 23 26 24   GLUCOSE 167* 163* 337* 61* 183*  BUN 33* 38* 40* 43* 47*  CREATININE 2.46* 2.44* 2.40* 2.41* 2.44*  CALCIUM 7.5* 8.2* 7.9* 8.0* 7.7*   Liver Function Tests:  Recent Labs Lab 02/14/13 1853 02/18/13 0500  AST 42* 26  ALT 17 12  ALKPHOS 197* 94  BILITOT 0.1* <0.1*  PROT 6.3 5.2*  ALBUMIN 1.9* 1.3*   CBC:  Recent Labs Lab 02/14/13 0028 02/14/13 1853 02/15/13 0602 02/15/13 2044 02/17/13 0420 02/18/13 0500 02/19/13 0400  WBC 10.2 9.9 9.5 10.4 9.3 7.8 6.5  NEUTROABS  --  8.1*  --   --   --   --   --   HGB 6.1* 7.0* 5.5* 8.7* 8.1* 7.8* 7.9*  HCT 18.8* 21.6* 16.3* 25.8* 24.2* 23.4* 23.5*  MCV 88.7 90.0 88.6 86.3 87.7 86.3 87.4  PLT 209 232 171 184 181  196 203   Cardiac Enzymes:  Recent Labs Lab 02/14/13 2333 02/15/13 0602 02/15/13 1508  TROPONINI <0.30 <0.30 <0.30   BNP (last 3 results)  Recent Labs  04/05/12 1307 02/14/13 1853 02/14/13 2333  PROBNP 4578.0* 7354.0* 8501.0*   CBG:  Recent Labs Lab 02/19/13 2013 02/20/13 0023 02/20/13 0406 02/20/13 0809 02/20/13 1132  GLUCAP 124* 187* 72 114* 84    Recent Results (from the past 240 hour(s))  URINE CULTURE     Status: None   Collection Time    02/14/13  8:18 PM      Result Value Range Status   Specimen Description URINE, CATHETERIZED   Final   Special Requests Normal   Final   Culture  Setup Time     Final   Value: 02/15/2013 02:50     Performed at [facility] Count     Final   Value: NO GROWTH     Performed at [facility]   Culture     Final   Value: NO GROWTH     Performed at [facility]   Report Status 02/16/2013 FINAL   Final  MRSA PCR SCREENING     Status: None    Collection Time    02/14/13 10:53 PM      Result Value Range Status   MRSA by PCR NEGATIVE  NEGATIVE Final   Comment:            The GeneXpert MRSA Assay (FDA     approved for NASAL specimens     only), is one component of a     comprehensive MRSA colonization     surveillance program. It is not     intended to diagnose MRSA     infection nor to guide or     monitor treatment for     MRSA infections.     Studies:  Recent x-ray studies have been reviewed in detail by the Attending Physician   Junious Silk, ANP  [facility]  Office [phone]  Pager [phone]  On-Call/Text [place]:  [email or web address]  password [facility]   02/20/2013, 12:40 PM   LOS: 6 days   I have personally examined this patient and reviewed the entire database. I have reviewed the above note, made any necessary editorial changes, and agree with its content.  Lonia Blood, MD [facility]

## 2013-02-20 NOTE — Care Management Note (Addendum)
Wiers 1 of 2   03/05/2013     1:41:28 PM   CARE MANAGEMENT NOTE 03/05/2013  Patient:  Madeline [name],Madeline [name]   Account Number:  [id number]  Date Initiated:  02/20/2013  Documentation initiated by:  [name],Madeline  Subjective/Objective Assessment:   adm with dx of diastolic failure and malignant HTN     Action/Plan:   IV Diurese/ home with [facility]   Anticipated DC Date:  03/01/2013   Anticipated DC Plan:  HOME W HOME HEALTH SERVICES  In-house referral  Clinical Social Worker      DC Associate Professor  CM consult      [facility] Choice  HOME HEALTH   Choice offered to / List presented to:  C-1 Patient   DME arranged  BEDSIDE COMMODE      DME agency  [facility]     [facility] arranged  HH-10 DISEASE MANAGEMENT  HH-1 RN  HH-2 PT  HH-3 OT      [facility] agency  [facility]   Status of service:  Completed, signed off Medicare Important Message given?   (If response is "NO", the following Medicare IM given date fields will be blank) Date Medicare IM given:   Date Additional Medicare IM given:    Discharge Disposition:  HOME W HOME HEALTH SERVICES  Per UR Regulation:  Reviewed for med. necessity/level of care/duration of stay  If discussed at Long Length of Stay Meetings, dates discussed:   02/23/2013    Comments:  03/05/13 12:45 CM spoke with pt in room and pt states she will explain her CPAP machine replacement process with her MD at followup care and will ask for a sleep study appt. Bedside commode to be delivered to room prior to discharge. HHPT/OT/RN with [facility] and add-on PT/OT referral faxed to [facility]. No other CM needs were communicated.  Madeline [name], BSN, [place] [phone].  02/27/13 1000 Madeline Cohn, RN, BSN, [employer] [phone] Spoke with pt at bedside regarding discharge planning.  Pt chose [facility] to render services of RN and PT.  Debbie of [facility] notified.  Pt inquired about getting new CPAP machine. States she was on CPAP in [place] and needs one here in [place].  NCM  will confirm with daughter how long pt was on CPAP; if >5 years, pt will have to have another sleep study to qualify for new machine; if not, pt will have to retrieve machine from [facility].  NCM will continue to follow.  02/23/13 1500 Pt. and family do not wish to go to SNF, and want to go home with home health.  Pt. did not have particular home health preference.  TC to Hilda Lias, with [facility] to give referral for [facility] RN.  Physician, please order [facility] RN with face to face.  May dc tomorrow or over weekend. Madeline Mater, RN, BSN [place] [phone]    02/23/13 1054 Noted PT rec for SNF, however with pt. insurance, this may not be disposition.  Will speak with family about home health services. Madeline Mater, RN, BSN [place] [phone]    02/20/13 1140 Madeline Mayo RN MSN BSN CCM Pt states she has applied for [place] [facility] and requests CM call dtr for information.  Dtr has MCD # but has not received pt's card yet.  Pt and dtr both state that plan is for pt to transfer to SNF for rehab.  Dtr also requests list of private duty agencies, as she works and will be unable to provide 24/7 care  when pt leaves SNF.  Also provided information re MCD PCS, provided PCS application and private duty agency list.  Per NP, pt will need sleep study - [facility] chart.

## 2013-02-21 DIAGNOSIS — D62 Acute posthemorrhagic anemia: Secondary | ICD-10-CM

## 2013-02-21 DIAGNOSIS — G4733 Obstructive sleep apnea (adult) (pediatric): Secondary | ICD-10-CM

## 2013-02-21 LAB — BASIC METABOLIC PANEL WITH GFR
BUN: 52 mg/dL — ABNORMAL HIGH (ref 6–23)
CO2: 27 meq/L (ref 19–32)
Calcium: 7.9 mg/dL — ABNORMAL LOW (ref 8.4–10.5)
Chloride: 108 meq/L (ref 96–112)
Creatinine, Ser: 2.47 mg/dL — ABNORMAL HIGH (ref 0.50–1.10)
GFR calc Af Amer: 24 mL/min — ABNORMAL LOW
GFR calc non Af Amer: 21 mL/min — ABNORMAL LOW
Glucose, Bld: 158 mg/dL — ABNORMAL HIGH (ref 70–99)
Potassium: 3.8 meq/L (ref 3.5–5.1)
Sodium: 144 meq/L (ref 135–145)

## 2013-02-21 LAB — GLUCOSE, CAPILLARY
Glucose-Capillary: 133 mg/dL — ABNORMAL HIGH (ref 70–99)
Glucose-Capillary: 104 mg/dL — ABNORMAL HIGH (ref 70–99)
Glucose-Capillary: 171 mg/dL — ABNORMAL HIGH (ref 70–99)
Glucose-Capillary: 204 mg/dL — ABNORMAL HIGH (ref 70–99)

## 2013-02-21 LAB — CBC
HCT: 23.2 % — ABNORMAL LOW (ref 36.0–46.0)
Hemoglobin: 7.7 g/dL — ABNORMAL LOW (ref 12.0–15.0)
MCH: 29.2 pg (ref 26.0–34.0)
MCHC: 33.2 g/dL (ref 30.0–36.0)
MCV: 87.9 fL (ref 78.0–100.0)
Platelets: 232 K/uL (ref 150–400)
RBC: 2.64 MIL/uL — ABNORMAL LOW (ref 3.87–5.11)
RDW: 14.9 % (ref 11.5–15.5)
WBC: 7.1 K/uL (ref 4.0–10.5)

## 2013-02-21 MED ORDER — GLUCOSE 40 % PO GEL
ORAL | Status: AC
  Administered 2013-02-21: 37.5 g
  Filled 2013-02-21: qty 1

## 2013-02-21 NOTE — Progress Notes (Signed)
CSW spoke with patient's daughterByrd [name] per patient's request. Active bed search is in place- however- patient has Medicaid as her only insurance and this presents a barrier to her receiving bed offers. Patient's daughter is actually a Film/video editor at [employer]. She would be able to bring her mother there - however, she strongly discourages her staff from admitting their family members at the facility and does not wish to change that policy now just because her mother needs placement. She is fully aware of her mother's limited placement options due to her insurance. Daughter states that her mother is actually much improved and PT now says SNF or HH PT. Daughter relates if no bed offers received- she plans to take patient home and there will be family that can be with her at home. She feels that this would be a successful arrangement. CSW will follow up in the a.m. Lorri Frederick. [place]  [phone]

## 2013-02-21 NOTE — Progress Notes (Signed)
Foley catheter d/c.  Pt tolerated well.  Instructed to call for assistance.  Verbalized understanding.  Will continue to monitor.  Amanda Pea, Charity fundraiser.

## 2013-02-21 NOTE — Evaluation (Signed)
Occupational Therapy Evaluation Patient Details Name: Madeline [name] MRN: [id number] DOB: [birth year] Today's Date: 02/21/2013 Time: 1311-1340 OT Time Calculation (min): 29 min  OT Assessment / Plan / Recommendation History of present illness 57 year old female patient with history of congestive heart failure coronary artery disease as well as chronic kidney disease. She also has diabetes and chronic anemia related to her kidney disease. She presented to the hospital with complaints of shortness of breath and was found systolic blood pressure greater than 200. Chest x-ray was consistent with possible pulmonary edema. Patient had rapid onset of shortness of breath according to her family and no constitutional symptoms that would lead one to believe she had an infectious process. He over the past several days patient has also had increasing swelling of her extremities. She has had several CHF exacerbations over the past year and has been hospitalized.   Clinical Impression   PT admitted with SOB with CHF exacerbation. Pt currently with functional limitiations due to the deficits listed below (see OT problem list).  Pt will benefit from skilled OT to increase their independence and safety with adls and balance to allow discharge HHOT.    OT Assessment  Patient needs continued OT Services    Follow Up Recommendations  Home health OT;Supervision - Intermittent - pending progress Reports having daughter and granddaughter assistance   Barriers to Discharge      Equipment Recommendations  None recommended by OT    Recommendations for Other Services    Frequency  Min 2X/week    Precautions / Restrictions Precautions Precautions: Fall   Pertinent Vitals/Pain 8 out 10 fatigue    ADL  Eating/Feeding: Modified independent Where Assessed - Eating/Feeding: Chair Grooming: Wash/dry hands;Wash/dry face;Teeth care;Min guard Where Assessed - Grooming: Unsupported standing Upper Body Bathing:  Chest;Right arm;Left arm;Abdomen;Min guard Where Assessed - Upper Body Bathing: Unsupported sit to stand Lower Body Bathing: Min guard Where Assessed - Lower Body Bathing: Supported sitting Upper Body Dressing: Min guard Where Assessed - Upper Body Dressing: Unsupported sitting Toilet Transfer: Min Pension scheme manager Method: Sit to Barista: Raised toilet seat with arms (or 3-in-1 over toilet) Toileting - Clothing Manipulation and Hygiene: Min guard Where Assessed - Toileting Clothing Manipulation and Hygiene: Sit to stand from 3-in-1 or toilet Equipment Used: Gait belt;Rolling walker Transfers/Ambulation Related to ADLs: pt ambulated supervision level with RW to sink level for grooming ADL Comments: Pt requesting grooming and bathing at sink this session. Pt completed full ADL. Pt required sitting for LB and rest breaks. Pt describes the task as 8 out 10 fatigue. Pt concerned with dry skin on bil LE and applied lotion. Pt returned to sitting in chair. PT reports overall fatigue today.     OT Diagnosis: Generalized weakness  OT Problem List: Decreased strength;Decreased activity tolerance;Impaired balance (sitting and/or standing);Decreased safety awareness;Decreased knowledge of use of DME or AE;Decreased knowledge of precautions OT Treatment Interventions: Self-care/ADL training;Therapeutic exercise;DME and/or AE instruction;Energy conservation;Therapeutic activities;Patient/family education;Balance training   OT Goals(Current goals can be found in the care plan section) Acute Rehab OT Goals Patient Stated Goal: be able to care for myself OT Goal Formulation: With patient Time For Goal Achievement: 03/07/13 Potential to Achieve Goals: Good  Visit Information  Last OT Received On: 02/21/13 Assistance Needed: +1 History of Present Illness: 57 year old female patient with history of congestive heart failure coronary artery disease as well as chronic kidney  disease. She also has diabetes and chronic anemia related to her kidney disease. She presented  to the hospital with complaints of shortness of breath and was found systolic blood pressure greater than 200. Chest x-ray was consistent with possible pulmonary edema. Patient had rapid onset of shortness of breath according to her family and no constitutional symptoms that would lead one to believe she had an infectious process. He over the past several days patient has also had increasing swelling of her extremities. She has had several CHF exacerbations over the past year and has been hospitalized.       Prior Functioning     Home Living Family/patient expects to be discharged to:: Private residence Living Arrangements: Alone Available Help at Discharge: Family;Available PRN/intermittently Type of Home: Apartment Home Access: Level entry Home Layout: One level Home Equipment: Walker - 2 wheels;Shower seat Prior Function Level of Independence: Needs assistance Gait / Transfers Assistance Needed: pt reports on good days she can walk in apt on her own with RW and others she needs assist ADL's / Homemaking Assistance Needed: pt needs asssist with bathing, dressing, pericare, housework Communication Communication: No difficulties Dominant Hand: Right         Vision/Perception Vision - History Baseline Vision: No visual deficits Patient Visual Report: No change from baseline   Cognition  Cognition Arousal/Alertness: Awake/alert Behavior During Therapy: WFL for tasks assessed/performed Overall Cognitive Status: Within Functional Limits for tasks assessed    Extremity/Trunk Assessment Upper Extremity Assessment Upper Extremity Assessment: Overall WFL for tasks assessed Lower Extremity Assessment Lower Extremity Assessment: Defer to PT evaluation Cervical / Trunk Assessment Cervical / Trunk Assessment: Normal     Mobility Bed Mobility Bed Mobility: Not  assessed Transfers Transfers: Sit to Stand;Stand to Sit Sit to Stand: 5: Supervision;With upper extremity assist;From chair/3-in-1 Stand to Sit: 5: Supervision;With upper extremity assist;To chair/3-in-1     Exercise     Balance     End of Session OT - End of Session Activity Tolerance: Patient tolerated treatment well Patient left: in chair;with call bell/phone within reach Nurse Communication: Mobility status;Precautions  GO     Harolyn Rutherford 02/21/2013, 2:12 PM Pager: [phone]

## 2013-02-21 NOTE — Progress Notes (Signed)
Text message sent to Dr. Lestine Box regarding pt's BS 50mg /ml and 105mg /ml after glucose 40% gel given po.  Pt asymptomatic.  Will continue to monitor.  Amanda Pea, Charity fundraiser.

## 2013-02-21 NOTE — Progress Notes (Addendum)
The patient states that she is feeling better this morning.  She pivoted to the bedside commode this morning with just standby assistance and states that she is even stronger than she was at the beginning of the shift last night.  She was compliant with her CPAP and SCDs overnight.

## 2013-02-21 NOTE — Progress Notes (Signed)
[facility] Progress Note   Madeline [name] IEP:[id number] DOB: [birth year] DOA: 02/14/2013 PCP: Pcp Not In System  Brief narrative: 57 year old female patient with history of congestive heart failure coronary artery disease as well as chronic kidney disease. She also has diabetes and chronic anemia related to her kidney disease. She presented to the hospital with complaints of shortness of breath and was found to have systolic blood pressure greater than 200. Chest x-ray was consistent with possible pulmonary edema. Patient had rapid onset of shortness of breath according to her family and no constitutional symptoms that would lead one to believe she had an infectious process. Over the past several days prior to admission the patient also had increasing swelling of her extremities. She has had several CHF exacerbations over the past year and has been hospitalized. Due to the degree of respiratory failure she did require BiPAP in the emergency department. She was also temporarily started on IV nitroglycerin and IV Lasix was given with subsequent improvement in the patient's blood pressure and breathing therefore BiPAP was weaned off. Patient now finalizing diuresis and discharge plans. Might need SNF for rehabilitation.  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 -cont IV Lasix TID-diuresing well- negative 11L since admit -BP is better, but still in the 160's systolic    HTN (hypertension), malignant -as above-recent Increase in Hydralazine to 100 and added Coreg with noted improvement. -will continue monitoring -patient also advised to be compliant with CPAP -low sodium diet     Anasarca and ascites - due to poorly controlled HTN/RHF/inadequately compliant with tx for OSA - cont to diurese as much as possible and increase oral Lasix to BID on d/c - recommend HHRN to follow weights and guaranteed medication/tx compliance     ARF (acute renal failure) on Chronic kidney disease (CKD), stage IV (severe) -acute component likely due to poor perfusion from marked HTN in setting of anemia -follow renal function with diuresis  -currently stable and pretty much at baseline    Iron deficiency and Anemia, chronic disease/Acute blood loss anemia -hgb nadir 5.5- 2 U prbcs given -stool was guiac positive, prompting GI consult in setting of anemia  -hgb appears to be stabilizing at ~8 -EGD revealed small multiple focal antral ulcers -given one dose of IV Iron - on iron as outpt - low Iron levels on anemia panel -continue PPI    DM (diabetes mellitus) - A1c 7.7 - CBGs remain stable - cont SSI    OSA on CPAP -cont CPAP -pt endorsed previously used CPAP in  but it "ran out" and she had not pursued obtaining a machine since moving to [place] -CM made aware of need for machine at dc and HH face to face completed   DVT prophylaxis: SCDs Code Status: Full Family Communication: No family present at time of exam today Disposition Plan/Expected LOS: continue diuresis - needs SNF per PT/OT  Consultants: Gastroenterology  Procedures: EGD  Stomach: Small hiatal hernia. In the prepyloric antrum of the  stomach the mucosa was very edematous-appearing with some scattered  focal ulcerations. Biopsies obtained.    2-D echocardiogram - Left ventricle: The cavity size was normal. Wall thickness was increased in  a pattern of moderate to severe LVH. The estimated ejection fraction was 65%. Wall motion was normal; there were no regional wall motion abnormalities. Findings consistent with left ventricular diastolic dysfunction. Doppler parameters are consistent with high ventricular  filling pressure. - Left atrium: The atrium was mildly dilated. - Right ventricle: The cavity size was moderately dilated. Systolic function was moderately reduced. - Right atrium: The atrium was mildly dilated. - Pulmonary arteries: PA peak pressure: 66mm Hg (S).  Antibiotics: None  HPI/Subjective: No complaints today.  Feels a lot better and breathing easier.  Objective: Blood pressure 162/60, pulse 83, temperature 98.7 F (37.1 C), temperature source Oral, resp. rate 20, height 4\' 9"  (1.448 m), weight 87.2 kg (192 lb 3.9 oz), SpO2 90.00%.  Intake/Output Summary (Last 24 hours) at 02/21/13 1330 Last data filed at 02/21/13 0900  Gross per 24 hour  Intake    723 ml  Output   1200 ml  Net   -477 ml   Exam: General: No acute respiratory distress Lungs: Distant breath sounds in all field, no wheezing; slight bibasilar crackles Cardiovascular: Regular rate and rhythm without murmur gallop or rub normal S1 and S2, positive peripheral edema 1+ ; anasarca is present Abdomen: Nontender, nondistended, soft, bowel sounds positive, no rebound, no ascites, no appreciable mass Musculoskeletal: No significant cyanosis, clubbing of bilateral lower extremities Neurological: Alert and oriented x 3, moves all extremities x 4 without focal neurological deficits, CN 2-12 intact  Scheduled Meds:  Scheduled Meds: . aspirin EC  81 mg Oral Daily  . carvedilol  20 mg Oral Daily  . ferrous sulfate  325 mg Oral Q breakfast  . furosemide  40 mg Intravenous Q12H  . gabapentin  100 mg Oral BID  . hydrALAZINE  100 mg Oral TID  . insulin aspart  0-5 Units Subcutaneous QHS  . insulin aspart  13 Units Subcutaneous TID WC  . insulin glargine  16 Units Subcutaneous QHS  . isosorbide mononitrate  120 mg Oral Daily  . NIFEdipine  60 mg Oral Daily  . pantoprazole  40 mg Oral BID  . sodium chloride  3 mL Intravenous Q12H    Data Reviewed: Basic Metabolic Panel:  Recent Labs Lab 02/16/13 1648  02/17/13 1053 02/18/13 0500 02/19/13 0400 02/21/13 0400  NA 139 138 141 138 144  K 5.0 4.9 4.1 3.8 3.8  CL 105 105 108 104 108  CO2 24 23 26 24 27   GLUCOSE 163* 337* 61* 183* 158*  BUN 38* 40* 43* 47* 52*  CREATININE 2.44* 2.40* 2.41* 2.44* 2.47*  CALCIUM 8.2* 7.9* 8.0* 7.7* 7.9*   Liver Function Tests:  Recent Labs Lab 02/14/13 1853 02/18/13 0500  AST 42* 26  ALT 17 12  ALKPHOS 197* 94  BILITOT 0.1* <0.1*  PROT 6.3 5.2*  ALBUMIN 1.9* 1.3*   CBC:  Recent Labs Lab 02/14/13 1853  02/15/13 2044 02/17/13 0420 02/18/13 0500 02/19/13 0400 02/21/13 0400  WBC 9.9  < > 10.4 9.3 7.8 6.5 7.1  NEUTROABS 8.1*  --   --   --   --   --   --   HGB 7.0*  < > 8.7* 8.1* 7.8* 7.9* 7.7*  HCT 21.6*  < > 25.8* 24.2* 23.4* 23.5* 23.2*  MCV 90.0  < > 86.3 87.7 86.3 87.4 87.9  PLT 232  < > 184 181 196 203 232  < > = values in this interval not displayed. Cardiac Enzymes:  Recent Labs Lab 02/14/13 2333 02/15/13 0602  02/15/13 1508  TROPONINI <0.30 <0.30 <0.30   BNP (last 3 results)  Recent Labs  04/05/12 1307 02/14/13 1853 02/14/13 2333  PROBNP 4578.0* 7354.0* 8501.0*   CBG:  Recent Labs Lab 02/20/13 1621 02/20/13 2127 02/21/13 0635 02/21/13 1101 02/21/13 1204  GLUCAP 133* 244* 133* 50* 104*    Recent Results (from the past 240 hour(s))  URINE CULTURE     Status: None   Collection Time    02/14/13  8:18 PM      Result Value Range Status   Specimen Description URINE, CATHETERIZED   Final   Special Requests Normal   Final   Culture  Setup Time     Final   Value: 02/15/2013 02:50     Performed at [facility] Count     Final   Value: NO GROWTH     Performed at [facility]   Culture     Final   Value: NO GROWTH     Performed at [facility]   Report Status 02/16/2013 FINAL   Final  MRSA PCR SCREENING     Status: None   Collection Time    02/14/13 10:53 PM      Result Value Range Status   MRSA by PCR NEGATIVE  NEGATIVE  Final   Comment:            The GeneXpert MRSA Assay (FDA     approved for NASAL specimens     only), is one component of a     comprehensive MRSA colonization     surveillance program. It is not     intended to diagnose MRSA     infection nor to guide or     monitor treatment for     MRSA infections.      Madeline [name] [phone]  02/21/2013, 1:30 PM   LOS: 7 days

## 2013-02-21 NOTE — Progress Notes (Signed)
Physical Therapy Treatment Patient Details Name: Madeline [name] MRN: [id number] DOB: 03-19-1956 Today's Date: 02/21/2013 Time: 1191-4782 PT Time Calculation (min): 23 min  PT Assessment / Plan / Recommendation  History of Present Illness 57 year old female patient with history of congestive heart failure coronary artery disease as well as chronic kidney disease. She also has diabetes and chronic anemia related to her kidney disease. She presented to the hospital with complaints of shortness of breath and was found systolic blood pressure greater than 200. Chest x-ray was consistent with possible pulmonary edema. Patient had rapid onset of shortness of breath according to her family and no constitutional symptoms that would lead one to believe she had an infectious process. He over the past several days patient has also had increasing swelling of her extremities. She has had several CHF exacerbations over the past year and has been hospitalized.   PT Comments   Pt with excellent progression from eval. If pt continues to progress and mobilize at this level she may be able to DC home with HHPT vs ST-SNF. Will continue to follow and encouraged pt to perform HEP and ambulation daily.  Follow Up Recommendations  SNF;Home health PT;Supervision for mobility/OOB (pending continued progression)     Does the patient have the potential to tolerate intense rehabilitation     Barriers to Discharge        Equipment Recommendations       Recommendations for Other Services    Frequency     Progress towards PT Goals Progress towards PT goals: Goals met and updated - see care plan  Plan Current plan remains appropriate    Precautions / Restrictions Precautions Precautions: Fall   Pertinent Vitals/Pain sats 90-94% on RA with activity HR 83 No pain    Mobility  Bed Mobility Bed Mobility: Supine to Sit Supine to Sit: 5: Supervision;With rails;HOB flat Details for Bed Mobility Assistance: cueing for  sequenc with heavy reliance on rail to transfer to sitting Transfers Sit to Stand: 5: Supervision;From bed Stand to Sit: 5: Supervision;To chair/3-in-1 Details for Transfer Assistance: cueing for sequence and safety Ambulation/Gait Ambulation/Gait Assistance: 5: Supervision Ambulation Distance (Feet): 400 Feet Assistive device: Rolling walker Ambulation/Gait Assistance Details: cueing for posture and position in RW Gait Pattern: Step-through pattern;Decreased stride length Gait velocity: decreased Stairs: No    Exercises General Exercises - Lower Extremity Ankle Circles/Pumps: AROM;Both;20 reps;Seated Long Arc Quad: AROM;Both;20 reps;Seated Hip ABduction/ADduction: AROM;Seated;Both;20 reps Straight Leg Raises: AROM;Both;10 reps;Seated Hip Flexion/Marching: AROM;Both;20 reps;Seated   PT Diagnosis:    PT Problem List:   PT Treatment Interventions:     PT Goals (current goals can now be found in the care plan section)    Visit Information  Last PT Received On: 02/21/13 Assistance Needed: +1 History of Present Illness: 57 year old female patient with history of congestive heart failure coronary artery disease as well as chronic kidney disease. She also has diabetes and chronic anemia related to her kidney disease. She presented to the hospital with complaints of shortness of breath and was found systolic blood pressure greater than 200. Chest x-ray was consistent with possible pulmonary edema. Patient had rapid onset of shortness of breath according to her family and no constitutional symptoms that would lead one to believe she had an infectious process. He over the past several days patient has also had increasing swelling of her extremities. She has had several CHF exacerbations over the past year and has been hospitalized.    Subjective Data  Cognition  Cognition Arousal/Alertness: Awake/alert Behavior During Therapy: WFL for tasks assessed/performed Overall Cognitive  Status: Within Functional Limits for tasks assessed    Balance     End of Session PT - End of Session Equipment Utilized During Treatment: Gait belt Activity Tolerance: Patient tolerated treatment well Patient left: in chair;with call bell/phone within reach   GP     Delorse Lek 02/21/2013, 8:39 AM Delaney Meigs, PT [phone]

## 2013-02-21 NOTE — Progress Notes (Signed)
Patient was placed on CPAP and is tolerating it well at this time. Rt will continue to monitor.

## 2013-02-21 NOTE — Clinical Social Work Note (Signed)
Clinical Social Work Department CLINICAL SOCIAL WORK PLACEMENT NOTE 02/21/2013  Patient:  Madeline [name],Madeline [name]  Account Number:  [id number] Admit date:  02/14/2013  Clinical Social Worker:  Cherre Blanc, [place]  Date/time:  02/21/2013 04:28 PM  Clinical Social Work is seeking post-discharge placement for this patient at the following level of care:   SKILLED NURSING   (*CSW will update this form in Epic as items are completed)   02/21/2013  Patient/family provided with [facility] Department of Clinical Social Work's list of facilities offering this level of care within the geographic area requested by the patient (or if unable, by the patient's family).  02/21/2013  Patient/family informed of their freedom to choose among providers that offer the needed level of care, that participate in Medicare, Medicaid or managed care program needed by the patient, have an available bed and are willing to accept the patient.  02/21/2013  Patient/family informed of [facility]' ownership interest in [facility], as well as of the fact that they are under no obligation to receive care at this facility.  PASARR submitted to EDS on  PASARR number received from EDS on   FL2 transmitted to all facilities in geographic area requested by pt/family on  02/21/2013 FL2 transmitted to all facilities within larger geographic area on 02/21/2013  Patient informed that his/her managed care company has contracts with or will negotiate with  certain facilities, including the following:     Patient/family informed of bed offers received:   Patient chooses bed at  Physician recommends and patient chooses bed at    Patient to be transferred to  on   Patient to be transferred to facility by   The following physician request were entered in Epic:   Additional Comments:   Roddie Mc, [place], [place], [phone]

## 2013-02-22 DIAGNOSIS — N184 Chronic kidney disease, stage 4 (severe): Secondary | ICD-10-CM

## 2013-02-22 LAB — BASIC METABOLIC PANEL
BUN: 51 mg/dL — ABNORMAL HIGH (ref 6–23)
CO2: 25 mEq/L (ref 19–32)
Calcium: 8.2 mg/dL — ABNORMAL LOW (ref 8.4–10.5)
Creatinine, Ser: 2.49 mg/dL — ABNORMAL HIGH (ref 0.50–1.10)
GFR calc non Af Amer: 21 mL/min — ABNORMAL LOW (ref >90)
Glucose, Bld: 102 mg/dL — ABNORMAL HIGH (ref 70–99)
Sodium: 141 mEq/L (ref 135–145)

## 2013-02-22 LAB — GLUCOSE, CAPILLARY
Glucose-Capillary: 176 mg/dL — ABNORMAL HIGH (ref 70–99)
Glucose-Capillary: 188 mg/dL — ABNORMAL HIGH (ref 70–99)
Glucose-Capillary: 190 mg/dL — ABNORMAL HIGH (ref 70–99)
Glucose-Capillary: 69 mg/dL — ABNORMAL LOW (ref 70–99)
Glucose-Capillary: 70 mg/dL (ref 70–99)

## 2013-02-22 MED ORDER — HYDRALAZINE HCL 50 MG PO TABS: 50.0000 mg | ORAL_TABLET | Freq: Three times a day (TID) | ORAL | Status: DC

## 2013-02-22 MED ORDER — INSULIN ASPART 100 UNIT/ML ~~LOC~~ SOLN
0.0000 [IU] | Freq: Every day | SUBCUTANEOUS | Status: DC
  Administered 2013-02-24 – 2013-02-25 (×2): 3 [IU] via SUBCUTANEOUS
  Administered 2013-02-26: 2 [IU] via SUBCUTANEOUS
  Administered 2013-03-02: 23:00:00 5 [IU] via SUBCUTANEOUS

## 2013-02-22 MED ORDER — POLYETHYLENE GLYCOL 3350 17 G PO PACK
17.0000 g | PACK | Freq: Every day | ORAL | Status: DC
  Administered 2013-02-22 – 2013-03-05 (×6): 17 g via ORAL
  Filled 2013-02-22 (×12): qty 1

## 2013-02-22 MED ORDER — ALBUTEROL SULFATE HFA 108 (90 BASE) MCG/ACT IN AERS
2.0000 | INHALATION_SPRAY | RESPIRATORY_TRACT | Status: DC | PRN
  Filled 2013-02-22: qty 6.7

## 2013-02-22 MED ORDER — METHYLPREDNISOLONE SODIUM SUCC 125 MG IJ SOLR
80.0000 mg | Freq: Two times a day (BID) | INTRAMUSCULAR | Status: DC
  Administered 2013-02-22 – 2013-02-24 (×5): 80 mg via INTRAVENOUS
  Filled 2013-02-22 (×7): qty 1.28

## 2013-02-22 MED ORDER — FERROUS SULFATE 325 (65 FE) MG PO TABS
325.0000 mg | ORAL_TABLET | Freq: Three times a day (TID) | ORAL | Status: DC
  Administered 2013-02-22 – 2013-03-05 (×33): 325 mg via ORAL
  Filled 2013-02-22 (×36): qty 1

## 2013-02-22 MED ORDER — INSULIN GLARGINE 100 UNIT/ML ~~LOC~~ SOLN
30.0000 [IU] | Freq: Every day | SUBCUTANEOUS | Status: DC
  Filled 2013-02-22: qty 0.3

## 2013-02-22 MED ORDER — FUROSEMIDE 10 MG/ML IJ SOLN
80.0000 mg | Freq: Two times a day (BID) | INTRAMUSCULAR | Status: DC
  Administered 2013-02-22 – 2013-02-24 (×5): 80 mg via INTRAVENOUS
  Filled 2013-02-22 (×7): qty 8

## 2013-02-22 MED ORDER — FUROSEMIDE 10 MG/ML IJ SOLN: 60.0000 mg | Freq: Three times a day (TID) | INTRAMUSCULAR | Status: DC

## 2013-02-22 MED ORDER — HYDRALAZINE HCL 20 MG/ML IJ SOLN
10.0000 mg | Freq: Once | INTRAMUSCULAR | Status: AC
  Administered 2013-02-22: 07:00:00 10 mg via INTRAVENOUS
  Filled 2013-02-22: qty 1

## 2013-02-22 MED ORDER — CARVEDILOL PHOSPHATE ER 40 MG PO CP24
40.0000 mg | ORAL_CAPSULE | Freq: Every day | ORAL | Status: DC
  Administered 2013-02-22 – 2013-03-05 (×12): 40 mg via ORAL
  Filled 2013-02-22 (×12): qty 1

## 2013-02-22 MED ORDER — INSULIN ASPART 100 UNIT/ML ~~LOC~~ SOLN
0.0000 [IU] | Freq: Three times a day (TID) | SUBCUTANEOUS | Status: DC
  Administered 2013-02-23: 12:00:00 2 [IU] via SUBCUTANEOUS
  Administered 2013-02-23: 07:00:00 3 [IU] via SUBCUTANEOUS
  Administered 2013-02-24: 12:00:00 1 [IU] via SUBCUTANEOUS
  Administered 2013-02-24: 3 [IU] via SUBCUTANEOUS
  Administered 2013-02-25 – 2013-03-01 (×4): 2 [IU] via SUBCUTANEOUS
  Administered 2013-03-01: 5 [IU] via SUBCUTANEOUS
  Administered 2013-03-02: 3 [IU] via SUBCUTANEOUS
  Administered 2013-03-02 – 2013-03-03 (×2): 2 [IU] via SUBCUTANEOUS
  Administered 2013-03-04: 13:00:00 3 [IU] via SUBCUTANEOUS
  Administered 2013-03-04: 06:00:00 2 [IU] via SUBCUTANEOUS
  Administered 2013-03-05: 06:00:00 3 [IU] via SUBCUTANEOUS

## 2013-02-22 MED ORDER — HYDRALAZINE HCL 20 MG/ML IJ SOLN
INTRAMUSCULAR | Status: AC
  Filled 2013-02-22: qty 1

## 2013-02-22 MED ORDER — HYDRALAZINE HCL 20 MG/ML IJ SOLN
5.0000 mg | Freq: Once | INTRAMUSCULAR | Status: AC
  Administered 2013-02-22: 01:00:00 5 mg via INTRAVENOUS

## 2013-02-22 MED ORDER — INSULIN GLARGINE 100 UNIT/ML ~~LOC~~ SOLN
16.0000 [IU] | Freq: Every day | SUBCUTANEOUS | Status: DC
  Administered 2013-02-23: 16 [IU] via SUBCUTANEOUS
  Filled 2013-02-22: qty 0.16

## 2013-02-22 NOTE — Progress Notes (Signed)
The patient's manual BP is 190/72 this morning.  She is asymptomatic.  The [facility] NP on-call was notified.  New orders were given to administer 10 mg of IV Hydralazine to the patient.  The RN will carry out the orders and will continue to monitor the patient.

## 2013-02-22 NOTE — Progress Notes (Signed)
[facility] PROGRESS NOTE Interim History: 57 year old female patient with history of congestive heart failure coronary artery disease as well as chronic kidney disease. She also has diabetes and chronic anemia related to her kidney disease. She presented to the hospital with complaints of shortness of breath and was found to have systolic blood pressure greater than 200. Chest x-ray was consistent with possible pulmonary edema.Due to the degree of respiratory failure she did require BiPAP in the emergency department. She was also temporarily started on IV nitroglycerin and IV Lasix was given with subsequent improvement in the patient's blood pressure and breathing therefore BiPAP was weaned off. Patient now finalizing diuresis and discharge plans   [facility] Weights   02/20/13 1700 02/21/13 0530 02/22/13 0551  Weight: 87.5 kg (192 lb 14.4 oz) 87.2 kg (192 lb 3.9 oz) 87.136 kg (192 lb 1.6 oz)        Intake/Output Summary (Last 24 hours) at 02/22/13 0719 Last data filed at 02/22/13 0551  Gross per 24 hour  Intake    963 ml  Output   1725 ml  Net   -762 ml    Assessment/Plan: Acute respiratory failure with hypoxia due to, Flash pulmonary edema due to malingnant HTN, Acute diastolic CHF (congestive heart failure)-stage 3/HTN (hypertension), malignant : -  Increase lasix and coreg. Cont Hydralazine and Imdur. - cont strict I and O's. fluid restriction. - echo did not show Pulm. HTN - poor air movement, with mild wheezing. Increase Lantus.   Acute right heart failure: - on calcium channel blockers.  ARF (acute renal failure) on Chronic kidney disease (CKD), stage IV (severe)  -Due to poor perfusion from marked HTN. -has documented CKD 3  Anemia, chronic disease/Acute blood loss anemia  -hgb nadir 5.5, -hgb up to 8.7 after PRBCs  -check CBC in am  -EGD revealed small multiple focal antral ulcers. PPI BID.  DM (diabetes mellitus)  -CBG elevated. - double Lantus to 30, due to  steroids. - cont monitor CBG's.   Code Status: full Family Communication: none  Disposition Plan: inpatient   Consultants:  none  Procedures: ECHO: 10.8.2014: ejection fraction was 65%. Wall motion was normal; there were no regional wall motion abnormalities. Findings consistent with left ventricular diastolic dysfunction. Doppler parameters are consistent with high ventricular filling pressure   Antibiotics:  None   HPI/Subjective: Complaining of SOB. Feels more weak.  Objective: Filed Vitals:   02/22/13 0000 02/22/13 0130 02/22/13 0551 02/22/13 0557  BP: 192/88 172/66 204/75 190/72  Pulse:   88   Temp:   99.9 F (37.7 C)   TempSrc:   Oral   Resp:   17   Height:      Weight:   87.136 kg (192 lb 1.6 oz)   SpO2:   100%      Exam: General: Alert, awake, oriented x3, in no acute distress.  HEENT: No bruits, no goiter. +JVD Heart: Regular rate and rhythm. Lungs: poor air movement, mild wheezing B/L Abdomen: Soft, nontender, nondistended, positive bowel sounds.     Data Reviewed: Basic Metabolic Panel:  Recent Labs Lab 02/16/13 1648 02/17/13 1053 02/18/13 0500 02/19/13 0400 02/21/13 0400  NA 139 138 141 138 144  K 5.0 4.9 4.1 3.8 3.8  CL 105 105 108 104 108  CO2 24 23 26 24 27   GLUCOSE 163* 337* 61* 183* 158*  BUN 38* 40* 43* 47* 52*  CREATININE 2.44* 2.40* 2.41* 2.44* 2.47*  CALCIUM 8.2* 7.9* 8.0* 7.7* 7.9*   Liver  Function Tests:  Recent Labs Lab 02/18/13 0500  AST 26  ALT 12  ALKPHOS 94  BILITOT <0.1*  PROT 5.2*  ALBUMIN 1.3*   No results found for this basename: LIPASE, AMYLASE,  in the last 168 hours No results found for this basename: AMMONIA,  in the last 168 hours CBC:  Recent Labs Lab 02/15/13 2044 02/17/13 0420 02/18/13 0500 02/19/13 0400 02/21/13 0400  WBC 10.4 9.3 7.8 6.5 7.1  HGB 8.7* 8.1* 7.8* 7.9* 7.7*  HCT 25.8* 24.2* 23.4* 23.5* 23.2*  MCV 86.3 87.7 86.3 87.4 87.9  PLT 184 181 196 203 232   Cardiac  Enzymes:  Recent Labs Lab 02/15/13 1508  TROPONINI <0.30   BNP (last 3 results)  Recent Labs  04/05/12 1307 02/14/13 1853 02/14/13 2333  PROBNP 4578.0* 7354.0* 8501.0*   CBG:  Recent Labs Lab 02/21/13 1101 02/21/13 1204 02/21/13 1629 02/21/13 2140 02/22/13 0643  GLUCAP 50* 104* 171* 204* 60*    Recent Results (from the past 240 hour(s))  URINE CULTURE     Status: None   Collection Time    02/14/13  8:18 PM      Result Value Range Status   Specimen Description URINE, CATHETERIZED   Final   Special Requests Normal   Final   Culture  Setup Time     Final   Value: 02/15/2013 02:50     Performed at [facility] Count     Final   Value: NO GROWTH     Performed at [facility]   Culture     Final   Value: NO GROWTH     Performed at [facility]   Report Status 02/16/2013 FINAL   Final  MRSA PCR SCREENING     Status: None   Collection Time    02/14/13 10:53 PM      Result Value Range Status   MRSA by PCR NEGATIVE  NEGATIVE Final   Comment:            The GeneXpert MRSA Assay (FDA     approved for NASAL specimens     only), is one component of a     comprehensive MRSA colonization     surveillance program. It is not     intended to diagnose MRSA     infection nor to guide or     monitor treatment for     MRSA infections.     Studies: No results found.  Scheduled Meds: . aspirin EC  81 mg Oral Daily  . carvedilol  40 mg Oral Daily  . ferrous sulfate  325 mg Oral Q breakfast  . furosemide  40 mg Intravenous Q12H  . gabapentin  100 mg Oral BID  . hydrALAZINE      . hydrALAZINE  100 mg Oral TID  . insulin aspart  0-5 Units Subcutaneous QHS  . insulin aspart  13 Units Subcutaneous TID WC  . insulin glargine  16 Units Subcutaneous QHS  . isosorbide mononitrate  120 mg Oral Daily  . NIFEdipine  60 mg Oral Daily  . pantoprazole  40 mg Oral BID  . sodium chloride  3 mL Intravenous Q12H   Continuous Infusions:    Marinda Elk  [facility] Pager [phone]. If 8PM-8AM, please contact night-coverage at [email or web address], password [facility] 02/22/2013, 7:19 AM  LOS: 8 days

## 2013-02-22 NOTE — Progress Notes (Addendum)
Inpatient Diabetes Program Recommendations  AACE/ADA: New Consensus Statement on Inpatient Glycemic Control (2013)  Target Ranges:  Prepandial:   less than 140 mg/dL      Peak postprandial:   less than 180 mg/dL (1-2 hours)      Critically ill patients:  140 - 180 mg/dL  Results for [name], Madeline (MRN [id number]) as of 02/22/2013 13:07  Ref. Range 02/21/2013 06:35 02/21/2013 11:01 02/21/2013 12:04 02/21/2013 16:29 02/21/2013 21:40  Glucose-Capillary Latest Range: 70-99 mg/dL 119 (H) 50 (L) 147 (H) 171 (H) 204 (H)    Results for [name], [name] (MRN [id number]) as of 02/22/2013 13:07  Ref. Range 02/22/2013 06:43 02/22/2013 07:03 02/22/2013 10:47  Glucose-Capillary Latest Range: 70-99 mg/dL 60 (L) 70 69 (L)   Inpatient Diabetes Program Recommendations Insulin - Basal: Please consider decreasing Lantus, recommend decreasing Lantus to 16 or 18 units QHS. Correction (SSI): Please consider ordering Novolog sensitive correction scale in addition to Novolog meal coverage.  Note: Noted that Lantus has been increased from 16 units QHS to 30 units QHS in anticipation of increased glucose with addition of steroids.  However, patient experienced blood glucose of 50 mg/dl yesterday at 86:57 and fasting glucose this morning was 60 mg/dl.  These two low blood glucose results were with Lantus 16 units QHS.  Would anticipate blood glucose to elevated with steroids but wonder if Lantus 30 units QHS is too much as there is a concern for further hypoglycemic episodes.  Note that Solumedrol 80 mg was given today at 8:03 and blood glucose at 10:47 was 69 mg/dl.  Recommend decreasing Lantus to 16 or 18 units QHS and add Novolog sensitive correction scale.  Tamera Stands, RN on 4E and discussed glycemic control.  Carollee Herter, RN reports that MD just came on the unit and she will discuss concerns with him regarding the increased dose of Lantus.  Will continue to follow.  Thanks, Orlando Penner, RN, MSN, CCRN Diabetes  Coordinator Inpatient Diabetes Program [phone] (Team Pager) [phone] ([facility] office) [phone] [facility] office)

## 2013-02-22 NOTE — Progress Notes (Signed)
Physical Therapy Treatment Patient Details Name: Madeline [name] MRN: [id number] DOB: 1956/02/17 Today's Date: 02/22/2013 Time: 1449-1500 PT Time Calculation (min): 11 min  PT Assessment / Plan / Recommendation  History of Present Illness 57 year old female patient with history of congestive heart failure coronary artery disease as well as chronic kidney disease. She also has diabetes and chronic anemia related to her kidney disease. She presented to the hospital with complaints of shortness of breath and was found systolic blood pressure greater than 200. Chest x-ray was consistent with possible pulmonary edema. Patient had rapid onset of shortness of breath according to her family and no constitutional symptoms that would lead one to believe she had an infectious process. He over the past several days patient has also had increasing swelling of her extremities. She has had several CHF exacerbations over the past year and has been hospitalized.   PT Comments   Pt admitted with above. Pt currently with limited treatment today secondary to fatigue per pt.  Pt only agreed to exercise.   Pt will benefit from skilled PT to increase their independence and safety with mobility to allow discharge to the venue listed below.   Follow Up Recommendations  SNF;Home health PT;Supervision for mobility/OOB (depending on progression)                 Equipment Recommendations  None recommended by PT        Frequency Min 3X/week   Progress towards PT Goals Progress towards PT goals: Progressing toward goals  Plan Current plan remains appropriate    Precautions / Restrictions Precautions Precautions: Fall Restrictions Weight Bearing Restrictions: No   Pertinent Vitals/Pain VSS, no pain    Mobility  Bed Mobility Bed Mobility: Not assessed Transfers Transfers: Not assessed Ambulation/Gait Ambulation/Gait Assistance: Not tested (comment) Stairs: No Wheelchair Mobility Wheelchair Mobility: No     Exercises General Exercises - Lower Extremity Ankle Circles/Pumps: AROM;Both;20 reps;Seated Long Arc Quad: AROM;Both;20 reps;Seated Hip ABduction/ADduction: AROM;Seated;Both;20 reps Straight Leg Raises: AROM;Both;10 reps;Seated Hip Flexion/Marching: AROM;Both;20 reps;Seated     PT Goals (current goals can now be found in the care plan section)    Visit Information  Last PT Received On: 02/22/13 Assistance Needed: +1 History of Present Illness: 57 year old female patient with history of congestive heart failure coronary artery disease as well as chronic kidney disease. She also has diabetes and chronic anemia related to her kidney disease. She presented to the hospital with complaints of shortness of breath and was found systolic blood pressure greater than 200. Chest x-ray was consistent with possible pulmonary edema. Patient had rapid onset of shortness of breath according to her family and no constitutional symptoms that would lead one to believe she had an infectious process. He over the past several days patient has also had increasing swelling of her extremities. She has had several CHF exacerbations over the past year and has been hospitalized.    Subjective Data  Subjective: "I have had a bad day."   Cognition  Cognition Arousal/Alertness: Awake/alert Behavior During Therapy: WFL for tasks assessed/performed Overall Cognitive Status: Within Functional Limits for tasks assessed    Balance     End of Session PT - End of Session Equipment Utilized During Treatment: Gait belt Activity Tolerance: Patient tolerated treatment well Patient left: in chair;with call bell/phone within reach Nurse Communication: Mobility status        INGOLD,Nechelle Petrizzo 02/22/2013, 4:40 PM [facility] [phone] (pager)

## 2013-02-22 NOTE — Progress Notes (Signed)
The patient's BP is still 192/88 manually after giving the PRN order of 20 mg IV Labetalol at 2115 and her scheduled PO hydralazine at 2210.  The patient states that she has a slight headache.  The [facility] NP on-call was notified.  New orders were given to administer 5 mg of IV Hydralazine and to recheck the patient's BP 30 minutes later.  The RN will carry out the orders and will continue to monitor the patient.

## 2013-02-23 LAB — GLUCOSE, CAPILLARY: Glucose-Capillary: 223 mg/dL — ABNORMAL HIGH (ref 70–99)

## 2013-02-23 LAB — BASIC METABOLIC PANEL
BUN: 62 mg/dL — ABNORMAL HIGH (ref 6–23)
CO2: 24 mEq/L (ref 19–32)
Calcium: 8.2 mg/dL — ABNORMAL LOW (ref 8.4–10.5)
Chloride: 102 mEq/L (ref 96–112)
Creatinine, Ser: 2.62 mg/dL — ABNORMAL HIGH (ref 0.50–1.10)
GFR calc non Af Amer: 19 mL/min — ABNORMAL LOW (ref >90)
Glucose, Bld: 252 mg/dL — ABNORMAL HIGH (ref 70–99)
Sodium: 139 mEq/L (ref 135–145)

## 2013-02-23 MED ORDER — METOLAZONE 5 MG PO TABS
5.0000 mg | ORAL_TABLET | Freq: Two times a day (BID) | ORAL | Status: DC
  Administered 2013-02-23 – 2013-02-25 (×4): 5 mg via ORAL
  Filled 2013-02-23 (×6): qty 1

## 2013-02-23 MED ORDER — METOLAZONE 5 MG PO TABS: 5.0000 mg | ORAL_TABLET | Freq: Two times a day (BID) | ORAL | Status: DC

## 2013-02-23 MED ORDER — METOLAZONE 5 MG PO TABS
5.0000 mg | ORAL_TABLET | Freq: Two times a day (BID) | ORAL | Status: DC
  Filled 2013-02-23: qty 1

## 2013-02-23 MED ORDER — INSULIN GLARGINE 100 UNIT/ML ~~LOC~~ SOLN: 30.0000 [IU] | Freq: Every day | SUBCUTANEOUS | Status: DC

## 2013-02-23 MED ORDER — INSULIN GLARGINE 100 UNIT/ML ~~LOC~~ SOLN: 20.0000 [IU] | Freq: Every day | SUBCUTANEOUS | Status: DC

## 2013-02-23 MED ORDER — INSULIN GLARGINE 100 UNIT/ML ~~LOC~~ SOLN
10.0000 [IU] | Freq: Once | SUBCUTANEOUS | Status: AC
  Administered 2013-02-23: 10 [IU] via SUBCUTANEOUS
  Filled 2013-02-23: qty 0.1

## 2013-02-23 MED ORDER — METOLAZONE 5 MG PO TABS
5.0000 mg | ORAL_TABLET | Freq: Once | ORAL | Status: AC
  Administered 2013-02-23: 5 mg via ORAL
  Filled 2013-02-23 (×2): qty 1

## 2013-02-23 MED ORDER — INSULIN GLARGINE 100 UNIT/ML ~~LOC~~ SOLN
30.0000 [IU] | Freq: Every day | SUBCUTANEOUS | Status: DC
  Administered 2013-02-23 – 2013-02-24 (×2): 30 [IU] via SUBCUTANEOUS
  Filled 2013-02-23 (×2): qty 0.3

## 2013-02-23 NOTE — Clinical Social Work Placement (Signed)
     Clinical Social Work Department CLINICAL SOCIAL WORK PLACEMENT NOTE 02/23/2013  Patient:  Madeline [name],Madeline [name]  Account Number:  [id number] Admit date:  02/14/2013  Clinical Social Worker:  Cherre Blanc, [place]  Date/time:  02/21/2013 04:28 PM  Clinical Social Work is seeking post-discharge placement for this patient at the following level of care:   SKILLED NURSING   (*CSW will update this form in Epic as items are completed)   02/21/2013  Patient/family provided with [facility] Department of Clinical Social Works list of facilities offering this level of care within the geographic area requested by the patient (or if unable, by the patients family).  02/21/2013  Patient/family informed of their freedom to choose among providers that offer the needed level of care, that participate in Medicare, Medicaid or managed care program needed by the patient, have an available bed and are willing to accept the patient.  02/21/2013  Patient/family informed of [facility] ownership interest in [facility], as well as of the fact that they are under no obligation to receive care at this facility.  PASARR submitted to EDS on  PASARR number received from EDS on   FL2 transmitted to all facilities in geographic area requested by pt/family on  02/21/2013 FL2 transmitted to all facilities within larger geographic area on 02/21/2013  Patient informed that his/her managed care company has contracts with or will negotiate with  certain facilities, including the following:     Patient/family informed of bed offers received:   Patient chooses bed at  Physician recommends and patient chooses bed at    Patient to be transferred to  on   Patient to be transferred to facility by   The following physician request were entered in Epic:   Additional Comments: 02/23/13  Bed search for pateint has not been sucessful. Possible bed offer received from [facility]. CSW spoke with  pateint's daughter and she declined bed. Stated "I am going to take her home and would like Home Health services. CSW notified RNCM-of above.  Daughter states that they have a large supportive family to help with her mother. CSW signing off.   Lorri Frederick. Jabbar Palmero, LCSWA  [phone]

## 2013-02-23 NOTE — Progress Notes (Signed)
02/23/13 1500 Pt. and family do not wish to go to SNF, and want to go home with home health.  Pt. did not have particular home health preference.  TC to Hilda Lias, with [facility] to give referral for [facility] RN.  Physician, please order [facility] RN with face to face.  May dc tomorrow or over weekend.  Tera Mater, RN, BSN NCM [phone]

## 2013-02-23 NOTE — Progress Notes (Signed)
CSW spoke to patient's daughter- Lazaro Arms (w) [phone]- only bed offer for "possible" placement is [facility]. Daughter declines this facility and as there are no other offers she states they will take patient home with home health when medically stable.  CSW will notify RNCM and will sign off.  Lorri Frederick. [place]  [phone]

## 2013-02-23 NOTE — Progress Notes (Signed)
[facility] PROGRESS NOTE Interim History: 57 year old female patient with history of congestive heart failure coronary artery disease as well as chronic kidney disease. She also has diabetes and chronic anemia related to her kidney disease. She presented to the hospital with complaints of shortness of breath and was found to have systolic blood pressure greater than 200. Chest x-ray was consistent with possible pulmonary edema.Due to the degree of respiratory failure she did require BiPAP in the emergency department. She was also temporarily started on IV nitroglycerin and IV Lasix was given with subsequent improvement in the patient's blood pressure and breathing therefore BiPAP was weaned off. Patient now finalizing diuresis and discharge plans   [facility] Weights   02/20/13 1700 02/21/13 0530 02/22/13 0551  Weight: 87.5 kg (192 lb 14.4 oz) 87.2 kg (192 lb 3.9 oz) 87.136 kg (192 lb 1.6 oz)        Intake/Output Summary (Last 24 hours) at 02/23/13 0731 Last data filed at 02/23/13 0600  Gross per 24 hour  Intake    840 ml  Output   1101 ml  Net   -261 ml    Assessment/Plan: Acute respiratory failure with hypoxia due to, Flash pulmonary edema due to malingnant HTN, Acute biventricular diastolic CHF (congestive heart failure)-stage 3/HTN (hypertension), malignant : -  Lasix and coreg. Cont Hydralazine and Imdur. Add low dose zaroxolyn. - cont strict I and O's. fluid restriction. Cont to be negative but < 1L. - echo did not show Pulm. HTN - good air movement, wheezing resolved. Re-Increase Lantus. - on calcium channel blockers.  ARF (acute renal failure) on Chronic kidney disease (CKD), stage IV (severe)  -Due to poor perfusion from marked HTN. b-met pending. -has documented CKD 3  Anemia, chronic disease/Acute blood loss anemia  -hgb nadir 5.5, -hgb up to 8.7 after PRBCs  -check CBC in am  -EGD revealed small multiple focal antral ulcers. PPI BID.  DM (diabetes mellitus)  -CBG  elevated. - double Lantus to 30, due to steroids. - cont monitor CBG's.   Code Status: full Family Communication: none  Disposition Plan: inpatient   Consultants:  none  Procedures: ECHO: 10.8.2014: ejection fraction was 65%. Wall motion was normal; there were no regional wall motion abnormalities. Findings consistent with left ventricular diastolic dysfunction. Doppler parameters are consistent with high ventricular filling pressure   Antibiotics:  None   HPI/Subjective: Relates SOB is improved.  Objective: Filed Vitals:   02/22/13 1424 02/22/13 1432 02/22/13 1950 02/23/13 0600  BP: 134/60 131/58 130/59 170/62  Pulse: 79 67 71 71  Temp:  98.9 F (37.2 C) 97.8 F (36.6 C)   TempSrc:  Oral Oral Oral  Resp: 18 18 18 18   Height:      Weight:      SpO2: 96% 97% 95% 100%     Exam: General: Alert, awake, oriented x3, in no acute distress.  HEENT: No bruits, no goiter. +JVD Heart: Regular rate and rhythm. Lungs: improved air movement, clear to auscultation Abdomen: Soft, nontender, nondistended, positive bowel sounds.     Data Reviewed: Basic Metabolic Panel:  Recent Labs Lab 02/17/13 1053 02/18/13 0500 02/19/13 0400 02/21/13 0400 02/22/13 0935  NA 138 141 138 144 141  K 4.9 4.1 3.8 3.8 3.6  CL 105 108 104 108 105  CO2 23 26 24 27 25   GLUCOSE 337* 61* 183* 158* 102*  BUN 40* 43* 47* 52* 51*  CREATININE 2.40* 2.41* 2.44* 2.47* 2.49*  CALCIUM 7.9* 8.0* 7.7* 7.9* 8.2*  Liver Function Tests:  Recent Labs Lab 02/18/13 0500  AST 26  ALT 12  ALKPHOS 94  BILITOT <0.1*  PROT 5.2*  ALBUMIN 1.3*   No results found for this basename: LIPASE, AMYLASE,  in the last 168 hours No results found for this basename: AMMONIA,  in the last 168 hours CBC:  Recent Labs Lab 02/17/13 0420 02/18/13 0500 02/19/13 0400 02/21/13 0400  WBC 9.3 7.8 6.5 7.1  HGB 8.1* 7.8* 7.9* 7.7*  HCT 24.2* 23.4* 23.5* 23.2*  MCV 87.7 86.3 87.4 87.9  PLT 181 196 203 232    Cardiac Enzymes: No results found for this basename: CKTOTAL, CKMB, CKMBINDEX, TROPONINI,  in the last 168 hours BNP (last 3 results)  Recent Labs  02/14/13 1853 02/14/13 2333 02/22/13 0709  PROBNP 7354.0* 8501.0* 5090.0*   CBG:  Recent Labs Lab 02/22/13 1047 02/22/13 1649 02/22/13 2154 02/22/13 2331 02/23/13 0628  GLUCAP 69* 188* 176* 190* 223*    Recent Results (from the past 240 hour(s))  URINE CULTURE     Status: None   Collection Time    02/14/13  8:18 PM      Result Value Range Status   Specimen Description URINE, CATHETERIZED   Final   Special Requests Normal   Final   Culture  Setup Time     Final   Value: 02/15/2013 02:50     Performed at [facility] Count     Final   Value: NO GROWTH     Performed at [facility]   Culture     Final   Value: NO GROWTH     Performed at [facility]   Report Status 02/16/2013 FINAL   Final  MRSA PCR SCREENING     Status: None   Collection Time    02/14/13 10:53 PM      Result Value Range Status   MRSA by PCR NEGATIVE  NEGATIVE Final   Comment:            The GeneXpert MRSA Assay (FDA     approved for NASAL specimens     only), is one component of a     comprehensive MRSA colonization     surveillance program. It is not     intended to diagnose MRSA     infection nor to guide or     monitor treatment for     MRSA infections.     Studies: No results found.  Scheduled Meds: . aspirin EC  81 mg Oral Daily  . carvedilol  40 mg Oral Daily  . ferrous sulfate  325 mg Oral TID WC  . furosemide  80 mg Intravenous Q12H  . gabapentin  100 mg Oral BID  . hydrALAZINE  100 mg Oral TID  . insulin aspart  0-5 Units Subcutaneous QHS  . insulin aspart  0-9 Units Subcutaneous TID WC  . insulin aspart  13 Units Subcutaneous TID WC  . insulin glargine  10 Units Subcutaneous Once  . insulin glargine  30 Units Subcutaneous QHS  . isosorbide mononitrate  120 mg Oral Daily  .  methylPREDNISolone (SOLU-MEDROL) injection  80 mg Intravenous Q12H  . metolazone  5 mg Oral Once  . NIFEdipine  60 mg Oral Daily  . pantoprazole  40 mg Oral BID  . polyethylene glycol  17 g Oral Daily  . sodium chloride  3 mL Intravenous Q12H   Continuous Infusions:    FELIZ ORTIZ, Darin Engels  [facility] Pager [phone]. If 8PM-8AM, please contact night-coverage at [email or web address], password [facility] 02/23/2013, 7:31 AM  LOS: 9 days

## 2013-02-24 LAB — BASIC METABOLIC PANEL
BUN: 68 mg/dL — ABNORMAL HIGH (ref 6–23)
CO2: 25 mEq/L (ref 19–32)
Chloride: 103 mEq/L (ref 96–112)
Creatinine, Ser: 2.67 mg/dL — ABNORMAL HIGH (ref 0.50–1.10)
GFR calc Af Amer: 22 mL/min — ABNORMAL LOW (ref >90)
GFR calc non Af Amer: 19 mL/min — ABNORMAL LOW (ref >90)
Glucose, Bld: 110 mg/dL — ABNORMAL HIGH (ref 70–99)
Potassium: 4.1 mEq/L (ref 3.5–5.1)
Sodium: 139 mEq/L (ref 135–145)

## 2013-02-24 LAB — GLUCOSE, CAPILLARY
Glucose-Capillary: 104 mg/dL — ABNORMAL HIGH (ref 70–99)
Glucose-Capillary: 141 mg/dL — ABNORMAL HIGH (ref 70–99)

## 2013-02-24 MED ORDER — INSULIN ASPART 100 UNIT/ML ~~LOC~~ SOLN
8.0000 [IU] | Freq: Three times a day (TID) | SUBCUTANEOUS | Status: DC
  Administered 2013-02-24: 8 [IU] via SUBCUTANEOUS
  Administered 2013-02-24: 16:00:00 100 [IU] via SUBCUTANEOUS
  Administered 2013-02-25 – 2013-03-05 (×19): 8 [IU] via SUBCUTANEOUS

## 2013-02-24 MED ORDER — FUROSEMIDE 10 MG/ML IJ SOLN: 80.0000 mg | Freq: Three times a day (TID) | INTRAMUSCULAR | Status: DC

## 2013-02-24 MED ORDER — PREDNISONE 20 MG PO TABS
40.0000 mg | ORAL_TABLET | Freq: Every day | ORAL | Status: DC
  Administered 2013-02-25: 06:00:00 40 mg via ORAL
  Filled 2013-02-24 (×2): qty 2

## 2013-02-24 MED ORDER — FUROSEMIDE 80 MG PO TABS
100.0000 mg | ORAL_TABLET | Freq: Two times a day (BID) | ORAL | Status: DC
  Administered 2013-02-24 – 2013-02-25 (×4): 100 mg via ORAL
  Filled 2013-02-24 (×7): qty 1

## 2013-02-24 NOTE — Progress Notes (Signed)
[facility] PROGRESS NOTE Interim History: 57 year old female patient with history of congestive heart failure coronary artery disease as well as chronic kidney disease. She also has diabetes and chronic anemia related to her kidney disease. She presented to the hospital with complaints of shortness of breath and was found to have systolic blood pressure greater than 200. Chest x-ray was consistent with possible pulmonary edema.Due to the degree of respiratory failure she did require BiPAP in the emergency department. She was also temporarily started on IV nitroglycerin and IV Lasix was given with subsequent improvement in the patient's blood pressure and breathing therefore BiPAP was weaned off. Patient now finalizing diuresis and discharge plans   [facility] Weights   02/22/13 0551 02/23/13 0737 02/24/13 0420  Weight: 87.136 kg (192 lb 1.6 oz) 86.41 kg (190 lb 8 oz) 87.635 kg (193 lb 3.2 oz)        Intake/Output Summary (Last 24 hours) at 02/24/13 0840 Last data filed at 02/24/13 0700  Gross per 24 hour  Intake   1340 ml  Output   2451 ml  Net  -1111 ml    Assessment/Plan: Acute respiratory failure with hypoxia due to, Flash pulmonary edema due to malingnant HTN, Acute biventricular diastolic CHF (congestive heart failure)-stage 3/HTN (hypertension), malignant : - Increase lasix Lasix and coreg. Cont Hydralazine and Imdur. Add low dose zaroxolyn. - Cont strict I and O's. fluid restriction. Improved urine output - echo did not show Pulm. HTN - on calcium channel blockers. - tapered down steroids, SOB improved.  ARF (acute renal failure) on Chronic kidney disease (CKD), stage IV (severe)  -  Cr stable - Has documented CKD 3  Anemia, chronic disease/Acute blood loss anemia  -hgb nadir 5.5, -hgb up to 8.7 after PRBCs. -EGD revealed small multiple focal antral ulcers. PPI BID.  DM (diabetes mellitus)  -CBG elevated. - double Lantus to 30, due to steroids. - cont monitor  CBG's.   Code Status: full Family Communication: none  Disposition Plan: inpatient   Consultants:  none  Procedures: ECHO: 10.8.2014: ejection fraction was 65%. Wall motion was normal; there were no regional wall motion abnormalities. Findings consistent with left ventricular diastolic dysfunction. Doppler parameters are consistent with high ventricular filling pressure   Antibiotics:  None   HPI/Subjective: Able to walk with out being SOB.  Objective: Filed Vitals:   02/23/13 1047 02/23/13 1322 02/23/13 2025 02/24/13 0420  BP: 195/82 162/69 170/71 155/67  Pulse: 67 73 72 68  Temp:  98.6 F (37 C) 98.3 F (36.8 C) 97.3 F (36.3 C)  TempSrc:  Oral Oral Oral  Resp: 18 18 18 18   Height:      Weight:    87.635 kg (193 lb 3.2 oz)  SpO2: 100% 96% 99% 99%     Exam: General: Alert, awake, oriented x3, in no acute distress.  HEENT: No bruits, no goiter. +JVD Heart: Regular rate and rhythm. Lungs: improved air movement, clear to auscultation Abdomen: Soft, nontender, nondistended, positive bowel sounds.     Data Reviewed: Basic Metabolic Panel:  Recent Labs Lab 02/19/13 0400 02/21/13 0400 02/22/13 0935 02/23/13 0600 02/24/13 0430  NA 138 144 141 139 139  K 3.8 3.8 3.6 4.3 4.1  CL 104 108 105 102 103  CO2 24 27 25 24 25   GLUCOSE 183* 158* 102* 252* 110*  BUN 47* 52* 51* 62* 68*  CREATININE 2.44* 2.47* 2.49* 2.62* 2.67*  CALCIUM 7.7* 7.9* 8.2* 8.2* 8.2*   Liver Function Tests:  Recent  Labs Lab 02/18/13 0500  AST 26  ALT 12  ALKPHOS 94  BILITOT <0.1*  PROT 5.2*  ALBUMIN 1.3*   No results found for this basename: LIPASE, AMYLASE,  in the last 168 hours No results found for this basename: AMMONIA,  in the last 168 hours CBC:  Recent Labs Lab 02/18/13 0500 02/19/13 0400 02/21/13 0400  WBC 7.8 6.5 7.1  HGB 7.8* 7.9* 7.7*  HCT 23.4* 23.5* 23.2*  MCV 86.3 87.4 87.9  PLT 196 203 232   Cardiac Enzymes: No results found for this basename:  CKTOTAL, CKMB, CKMBINDEX, TROPONINI,  in the last 168 hours BNP (last 3 results)  Recent Labs  02/14/13 1853 02/14/13 2333 02/22/13 0709  PROBNP 7354.0* 8501.0* 5090.0*   CBG:  Recent Labs Lab 02/23/13 0628 02/23/13 1120 02/23/13 1608 02/23/13 2109 02/24/13 0546  GLUCAP 223* 177* 109* 103* 104*    Recent Results (from the past 240 hour(s))  URINE CULTURE     Status: None   Collection Time    02/14/13  8:18 PM      Result Value Range Status   Specimen Description URINE, CATHETERIZED   Final   Special Requests Normal   Final   Culture  Setup Time     Final   Value: 02/15/2013 02:50     Performed at [facility] Count     Final   Value: NO GROWTH     Performed at [facility]   Culture     Final   Value: NO GROWTH     Performed at [facility]   Report Status 02/16/2013 FINAL   Final  MRSA PCR SCREENING     Status: None   Collection Time    02/14/13 10:53 PM      Result Value Range Status   MRSA by PCR NEGATIVE  NEGATIVE Final   Comment:            The GeneXpert MRSA Assay (FDA     approved for NASAL specimens     only), is one component of a     comprehensive MRSA colonization     surveillance program. It is not     intended to diagnose MRSA     infection nor to guide or     monitor treatment for     MRSA infections.     Studies: No results found.  Scheduled Meds: . aspirin EC  81 mg Oral Daily  . carvedilol  40 mg Oral Daily  . ferrous sulfate  325 mg Oral TID WC  . furosemide  80 mg Intravenous Q8H  . gabapentin  100 mg Oral BID  . hydrALAZINE  100 mg Oral TID  . insulin aspart  0-5 Units Subcutaneous QHS  . insulin aspart  0-9 Units Subcutaneous TID WC  . insulin aspart  13 Units Subcutaneous TID WC  . insulin glargine  30 Units Subcutaneous QHS  . isosorbide mononitrate  120 mg Oral Daily  . methylPREDNISolone (SOLU-MEDROL) injection  80 mg Intravenous Q12H  . metolazone  5 mg Oral Q12H  . NIFEdipine  60 mg  Oral Daily  . pantoprazole  40 mg Oral BID  . polyethylene glycol  17 g Oral Daily  . sodium chloride  3 mL Intravenous Q12H   Continuous Infusions:    Marinda Elk  [facility] Pager [phone]. If 8PM-8AM, please contact night-coverage at [email or web address], password [facility] 02/24/2013, 8:40 AM  LOS: 10 days

## 2013-02-24 NOTE — Progress Notes (Signed)
Utilization Review Completed.   Choua Chalker, RN, BSN Nurse Case Manager  [phone]  

## 2013-02-24 NOTE — Progress Notes (Signed)
Occupational Therapy Treatment Patient Details Name: Madeline [name] MRN: [id number] DOB: 14-Dec-1955 Today's Date: 02/24/2013 Time: 7829-5621 OT Time Calculation (min): 25 min  OT Assessment / Plan / Recommendation  History of present illness 57 year old female patient with history of congestive heart failure coronary artery disease as well as chronic kidney disease. She also has diabetes and chronic anemia related to her kidney disease. She presented to the hospital with complaints of shortness of breath and was found systolic blood pressure greater than 200. Chest x-ray was consistent with possible pulmonary edema. Patient had rapid onset of shortness of breath according to her family and no constitutional symptoms that would lead one to believe she had an infectious process. He over the past several days patient has also had increasing swelling of her extremities. She has had several CHF exacerbations over the past year and has been hospitalized.   OT comments  Pt making progress with functional goals and should continue with acute OT services to help restore PLOF to return home safely  Follow Up Recommendations  Home health OT;Supervision - Intermittent    Barriers to Discharge   None    Equipment Recommendations  None recommended by OT    Recommendations for Other Services    Frequency Min 2X/week   Progress towards OT Goals Progress towards OT goals: Progressing toward goals  Plan Discharge plan remains appropriate    Precautions / Restrictions Precautions Precautions: Fall Restrictions Weight Bearing Restrictions: No   Pertinent Vitals/Pain No c/o pain    ADL  Grooming: Performed;Wash/dry hands;Wash/dry face;Supervision/safety;Set up Where Assessed - Grooming: Unsupported standing Upper Body Bathing: Performed;Supervision/safety;Set up Where Assessed - Upper Body Bathing: Unsupported sitting;Supported sit to stand Lower Body Bathing: Performed;Min guard Where Assessed -  Lower Body Bathing: Supported sit to stand;Unsupported sitting Toilet Transfer: Research scientist (life sciences) Method: Sit to Barista: Regular height toilet;Bedside commode;Grab bars Toileting - Clothing Manipulation and Hygiene: Supervision/safety Where Assessed - Toileting Clothing Manipulation and Hygiene: Standing Transfers/Ambulation Related to ADLs: cues for correct hand placement. Pt encouraged to call for staff assist when up OOB    OT Diagnosis:    OT Problem List:   OT Treatment Interventions:     OT Goals(current goals can now be found in the care plan section)    Visit Information  Last OT Received On: 02/24/13 History of Present Illness: 57 year old female patient with history of congestive heart failure coronary artery disease as well as chronic kidney disease. She also has diabetes and chronic anemia related to her kidney disease. She presented to the hospital with complaints of shortness of breath and was found systolic blood pressure greater than 200. Chest x-ray was consistent with possible pulmonary edema. Patient had rapid onset of shortness of breath according to her family and no constitutional symptoms that would lead one to believe she had an infectious process. He over the past several days patient has also had increasing swelling of her extremities. She has had several CHF exacerbations over the past year and has been hospitalized.    Subjective Data      Prior Functioning       Cognition  Cognition Arousal/Alertness: Awake/alert Behavior During Therapy: WFL for tasks assessed/performed Overall Cognitive Status: Within Functional Limits for tasks assessed    Mobility  Bed Mobility Bed Mobility: Not assessed Supine to Sit: HOB flat;5: Supervision Sitting - Scoot to Edge of Bed: 6: Modified independent (Device/Increase time) Sit to Supine: 6: Modified independent (Device/Increase time);HOB flat Details for Bed Mobility  Assistance: pt up walking top bathroom upon entering room Transfers Transfers: Sit to Stand;Stand to Sit Sit to Stand: From chair/3-in-1;5: Supervision;From toilet Stand to Sit: To chair/3-in-1;5: Supervision;To toilet Details for Transfer Assistance: cueing for hand placement       Balance Balance Balance Assessed: Yes Dynamic Standing Balance Dynamic Standing - Balance Support: No upper extremity supported;During functional activity Dynamic Standing - Level of Assistance: 5: Stand by assistance   End of Session OT - End of Session Activity Tolerance: Patient tolerated treatment well Patient left: in chair;with call bell/phone within reach  GO     Madeline [name] 02/24/2013, 12:43 PM

## 2013-02-24 NOTE — Progress Notes (Signed)
Physical Therapy Treatment Patient Details Name: Madeline [name] MRN: [id number] DOB: Feb 20, 1956 Today's Date: 02/24/2013 Time: 0865-7846 PT Time Calculation (min): 23 min  PT Assessment / Plan / Recommendation  History of Present Illness 57 year old female patient with history of congestive heart failure coronary artery disease as well as chronic kidney disease. She also has diabetes and chronic anemia related to her kidney disease. She presented to the hospital with complaints of shortness of breath and was found systolic blood pressure greater than 200. Chest x-ray was consistent with possible pulmonary edema. Patient had rapid onset of shortness of breath according to her family and no constitutional symptoms that would lead one to believe she had an infectious process. He over the past several days patient has also had increasing swelling of her extremities. She has had several CHF exacerbations over the past year and has been hospitalized.   PT Comments   Pt with excellent progress from eval and is appropriate for discharge home with continuation of HEP. Pt educated for transfers, dietary restrictions and encouraged to walk daily. Will follow acutely to maximize independence.  Follow Up Recommendations  Home health PT     Does the patient have the potential to tolerate intense rehabilitation     Barriers to Discharge        Equipment Recommendations       Recommendations for Other Services    Frequency     Progress towards PT Goals Progress towards PT goals: Goals met and updated - see care plan  Plan Discharge plan needs to be updated    Precautions / Restrictions Precautions Precautions: Fall   Pertinent Vitals/Pain    Mobility  Bed Mobility Bed Mobility: Supine to Sit;Sit to Supine Supine to Sit: HOB flat;5: Supervision Sitting - Scoot to Edge of Bed: 6: Modified independent (Device/Increase time) Sit to Supine: 6: Modified independent (Device/Increase time);HOB  flat Details for Bed Mobility Assistance: cues for ease of transfer with supine to sit but pt able to perform on her own Transfers Sit to Stand: From bed;From chair/3-in-1;5: Supervision Stand to Sit: To chair/3-in-1;To bed;5: Supervision Details for Transfer Assistance: cueing for hand placement Ambulation/Gait Ambulation/Gait Assistance: 6: Modified independent (Device/Increase time) Ambulation Distance (Feet): 600 Feet Assistive device: Rolling walker Gait Pattern: Step-through pattern;Decreased stride length Gait velocity: decreased Stairs: No    Exercises General Exercises - Lower Extremity Long Arc Quad: AROM;Both;20 reps;Seated Hip ABduction/ADduction: AROM;Seated;Both;20 reps Toe Raises: AROM;Seated;Both;20 reps Heel Raises: AROM;Seated;Both;20 reps   PT Diagnosis:    PT Problem List:   PT Treatment Interventions:     PT Goals (current goals can now be found in the care plan section)    Visit Information  Last PT Received On: 02/24/13 History of Present Illness: 57 year old female patient with history of congestive heart failure coronary artery disease as well as chronic kidney disease. She also has diabetes and chronic anemia related to her kidney disease. She presented to the hospital with complaints of shortness of breath and was found systolic blood pressure greater than 200. Chest x-ray was consistent with possible pulmonary edema. Patient had rapid onset of shortness of breath according to her family and no constitutional symptoms that would lead one to believe she had an infectious process. He over the past several days patient has also had increasing swelling of her extremities. She has had several CHF exacerbations over the past year and has been hospitalized.    Subjective Data      Cognition  Cognition Arousal/Alertness: Awake/alert Behavior During  Therapy: WFL for tasks assessed/performed Overall Cognitive Status: Within Functional Limits for tasks assessed     Balance     End of Session PT - End of Session Activity Tolerance: Patient tolerated treatment well Patient left: in chair;with call bell/phone within reach Nurse Communication: Mobility status   GP     Madeline [name] 02/24/2013, 12:10 PM Delaney Meigs, PT [phone]

## 2013-02-24 NOTE — Progress Notes (Signed)
Pt up on the chair, VSS, denies any pain or SOB, no distress noticed, pt wearing her CPAP during night time. We'll continue with POC.

## 2013-02-24 NOTE — Progress Notes (Signed)
Pt up to the chair, denies any SOB or discomfort a this time, VSS, no distress noticed.

## 2013-02-25 LAB — CBC
HCT: 26.7 % — ABNORMAL LOW (ref 36.0–46.0)
Hemoglobin: 8.9 g/dL — ABNORMAL LOW (ref 12.0–15.0)
MCHC: 33.3 g/dL (ref 30.0–36.0)
MCV: 88.1 fL (ref 78.0–100.0)
Platelets: 298 10*3/uL (ref 150–400)
RBC: 3.03 MIL/uL — ABNORMAL LOW (ref 3.87–5.11)
RDW: 15.3 % (ref 11.5–15.5)
WBC: 13 10*3/uL — ABNORMAL HIGH (ref 4.0–10.5)

## 2013-02-25 LAB — GLUCOSE, CAPILLARY
Glucose-Capillary: 290 mg/dL — ABNORMAL HIGH (ref 70–99)
Glucose-Capillary: 177 mg/dL — ABNORMAL HIGH (ref 70–99)
Glucose-Capillary: 71 mg/dL (ref 70–99)
Glucose-Capillary: 85 mg/dL (ref 70–99)
Glucose-Capillary: 165 mg/dL — ABNORMAL HIGH (ref 70–99)
Glucose-Capillary: 268 mg/dL — ABNORMAL HIGH (ref 70–99)

## 2013-02-25 LAB — BASIC METABOLIC PANEL
BUN: 77 mg/dL — ABNORMAL HIGH (ref 6–23)
Calcium: 8.1 mg/dL — ABNORMAL LOW (ref 8.4–10.5)
Chloride: 105 mEq/L (ref 96–112)
GFR calc Af Amer: 20 mL/min — ABNORMAL LOW (ref >90)
GFR calc non Af Amer: 17 mL/min — ABNORMAL LOW (ref >90)
Glucose, Bld: 194 mg/dL — ABNORMAL HIGH (ref 70–99)
Potassium: 3.8 mEq/L (ref 3.5–5.1)
Sodium: 141 mEq/L (ref 135–145)

## 2013-02-25 MED ORDER — PREDNISONE 20 MG PO TABS
30.0000 mg | ORAL_TABLET | Freq: Every day | ORAL | Status: DC
  Administered 2013-02-26: 30 mg via ORAL
  Filled 2013-02-25 (×2): qty 1

## 2013-02-25 MED ORDER — METOLAZONE 5 MG PO TABS
5.0000 mg | ORAL_TABLET | Freq: Two times a day (BID) | ORAL | Status: DC
  Administered 2013-02-25 – 2013-03-05 (×15): 5 mg via ORAL
  Filled 2013-02-25 (×18): qty 1

## 2013-02-25 MED ORDER — INSULIN GLARGINE 100 UNIT/ML ~~LOC~~ SOLN
25.0000 [IU] | Freq: Every day | SUBCUTANEOUS | Status: DC
  Administered 2013-02-25 – 2013-02-26 (×2): 25 [IU] via SUBCUTANEOUS
  Filled 2013-02-25 (×3): qty 0.25

## 2013-02-25 NOTE — Progress Notes (Signed)
[facility] PROGRESS NOTE Interim History: 57 year old female patient with history of congestive heart failure coronary artery disease as well as chronic kidney disease. She also has diabetes and chronic anemia related to her kidney disease. She presented to the hospital with complaints of shortness of breath and was found to have systolic blood pressure greater than 200. Chest x-ray was consistent with possible pulmonary edema.Due to the degree of respiratory failure she did require BiPAP in the emergency department. She was also temporarily started on IV nitroglycerin and IV Lasix was given with subsequent improvement in the patient's blood pressure and breathing therefore BiPAP was weaned off. Patient now finalizing diuresis and discharge plans   [facility] Weights   02/23/13 0737 02/24/13 0420 02/25/13 0624  Weight: 86.41 kg (190 lb 8 oz) 87.635 kg (193 lb 3.2 oz) 88.95 kg (196 lb 1.6 oz)        Intake/Output Summary (Last 24 hours) at 02/25/13 0744 Last data filed at 02/25/13 4098  Gross per 24 hour  Intake   1818 ml  Output   2300 ml  Net   -482 ml    Assessment/Plan: Acute respiratory failure with hypoxia due to, Flash pulmonary edema due to malingnant HTN, Acute biventricular diastolic CHF (congestive heart failure)-stage 3/HTN (hypertension), malignant : - change lasix to oral and coreg. Cont Hydralazine and Imdur. Cont zaroxolyn. - Cont strict I and O's. fluid restriction. Improved urine output - echo did not show Pulm. HTN - on calcium channel blockers. - tapered down steroids, SOB improved.   Chronic kidney disease (CKD), stage IV (severe)  -  Cr at baselinee - Has documented CKD IV  Anemia, chronic disease/Acute blood loss anemia  -hgb nadir 5.5, -CBC  -EGD revealed small multiple focal antral ulcers. PPI BID.  DM (diabetes mellitus)  -CBG elevated. - double Lantus to 30, due to steroids. - cont monitor CBG's.   Code Status: full Family Communication: none   Disposition Plan: inpatient   Consultants:  none  Procedures: ECHO: 10.8.2014: ejection fraction was 65%. Wall motion was normal; there were no regional wall motion abnormalities. Findings consistent with left ventricular diastolic dysfunction. Doppler parameters are consistent with high ventricular filling pressure   Antibiotics:  None   HPI/Subjective: Able to walk with out being SOB.  Objective: Filed Vitals:   02/24/13 1451 02/24/13 1500 02/24/13 1950 02/25/13 0624  BP: 183/82 154/62 152/62 155/66  Pulse: 69 65 72 71  Temp: 97.3 F (36.3 C) 99.4 F (37.4 C) 98.2 F (36.8 C) 98.1 F (36.7 C)  TempSrc: Oral  Oral Oral  Resp: 18 20 18 18   Height:      Weight:    88.95 kg (196 lb 1.6 oz)  SpO2: 99% 96% 100% 100%     Exam: General: Alert, awake, oriented x3, in no acute distress.  HEENT: No bruits, no goiter. +JVD Heart: Regular rate and rhythm. Lungs: improved air movement, clear to auscultation Abdomen: Soft, nontender, nondistended, positive bowel sounds.     Data Reviewed: Basic Metabolic Panel:  Recent Labs Lab 02/21/13 0400 02/22/13 0935 02/23/13 0600 02/24/13 0430 02/25/13 0445  NA 144 141 139 139 141  K 3.8 3.6 4.3 4.1 3.8  CL 108 105 102 103 105  CO2 27 25 24 25 21   GLUCOSE 158* 102* 252* 110* 194*  BUN 52* 51* 62* 68* 77*  CREATININE 2.47* 2.49* 2.62* 2.67* 2.93*  CALCIUM 7.9* 8.2* 8.2* 8.2* 8.1*   Liver Function Tests: No results found for this basename:  AST, ALT, ALKPHOS, BILITOT, PROT, ALBUMIN,  in the last 168 hours No results found for this basename: LIPASE, AMYLASE,  in the last 168 hours No results found for this basename: AMMONIA,  in the last 168 hours CBC:  Recent Labs Lab 02/19/13 0400 02/21/13 0400  WBC 6.5 7.1  HGB 7.9* 7.7*  HCT 23.5* 23.2*  MCV 87.4 87.9  PLT 203 232   Cardiac Enzymes: No results found for this basename: CKTOTAL, CKMB, CKMBINDEX, TROPONINI,  in the last 168 hours BNP (last 3  results)  Recent Labs  02/14/13 1853 02/14/13 2333 02/22/13 0709  PROBNP 7354.0* 8501.0* 5090.0*   CBG:  Recent Labs Lab 02/24/13 0546 02/24/13 1056 02/24/13 1619 02/24/13 2034 02/25/13 0548  GLUCAP 104* 141* 216* 290* 177*    No results found for this or any previous visit (from the past 240 hour(s)).   Studies: No results found.  Scheduled Meds: . aspirin EC  81 mg Oral Daily  . carvedilol  40 mg Oral Daily  . ferrous sulfate  325 mg Oral TID WC  . furosemide  100 mg Oral BID  . gabapentin  100 mg Oral BID  . hydrALAZINE  100 mg Oral TID  . insulin aspart  0-5 Units Subcutaneous QHS  . insulin aspart  0-9 Units Subcutaneous TID WC  . insulin aspart  8 Units Subcutaneous TID WC  . insulin glargine  30 Units Subcutaneous QHS  . isosorbide mononitrate  120 mg Oral Daily  . NIFEdipine  60 mg Oral Daily  . pantoprazole  40 mg Oral BID  . polyethylene glycol  17 g Oral Daily  . predniSONE  40 mg Oral Q breakfast  . sodium chloride  3 mL Intravenous Q12H   Continuous Infusions:    Marinda Elk  [facility] Pager [phone]. If 8PM-8AM, please contact night-coverage at [email or web address], password [facility] 02/25/2013, 7:44 AM  LOS: 11 days

## 2013-02-25 NOTE — Progress Notes (Signed)
Pt and staff instructed this am of pts 1200cc fluid restriction. I found bottles of soda and cups from family members in room. Fluid restriction re stressed to pt.

## 2013-02-25 NOTE — Progress Notes (Signed)
Pt had episode of vomiting earlier with some am meds expeled. States this has been going on at home for weeks every morning, Refuses Zofran at this time

## 2013-02-26 ENCOUNTER — Inpatient Hospital Stay (HOSPITAL_COMMUNITY): Payer: Medicaid Other

## 2013-02-26 LAB — BASIC METABOLIC PANEL
BUN: 81 mg/dL — ABNORMAL HIGH (ref 6–23)
CO2: 25 mEq/L (ref 19–32)
Chloride: 104 mEq/L (ref 96–112)
Creatinine, Ser: 2.83 mg/dL — ABNORMAL HIGH (ref 0.50–1.10)
Glucose, Bld: 95 mg/dL (ref 70–99)
Sodium: 141 mEq/L (ref 135–145)

## 2013-02-26 LAB — GLUCOSE, CAPILLARY
Glucose-Capillary: 86 mg/dL (ref 70–99)
Glucose-Capillary: 92 mg/dL (ref 70–99)
Glucose-Capillary: 81 mg/dL (ref 70–99)
Glucose-Capillary: 207 mg/dL — ABNORMAL HIGH (ref 70–99)

## 2013-02-26 MED ORDER — PREDNISONE 10 MG PO TABS
10.0000 mg | ORAL_TABLET | Freq: Every day | ORAL | Status: DC
  Administered 2013-02-27 – 2013-03-02 (×4): 10 mg via ORAL
  Filled 2013-02-26 (×5): qty 1

## 2013-02-26 MED ORDER — NIFEDIPINE ER 60 MG PO TB24
60.0000 mg | ORAL_TABLET | Freq: Every day | ORAL | Status: DC
  Administered 2013-02-26 – 2013-03-01 (×4): 60 mg via ORAL
  Filled 2013-02-26 (×5): qty 1

## 2013-02-26 MED ORDER — FUROSEMIDE 10 MG/ML IJ SOLN
160.0000 mg | Freq: Two times a day (BID) | INTRAVENOUS | Status: DC
  Filled 2013-02-26 (×2): qty 16

## 2013-02-26 MED ORDER — NIFEDIPINE ER OSMOTIC RELEASE 90 MG PO TB24
90.0000 mg | ORAL_TABLET | Freq: Every day | ORAL | Status: DC
  Filled 2013-02-26: qty 1

## 2013-02-26 MED ORDER — CLONIDINE HCL 0.2 MG PO TABS
0.2000 mg | ORAL_TABLET | Freq: Three times a day (TID) | ORAL | Status: DC
  Administered 2013-02-26: 08:00:00 0.2 mg via ORAL
  Filled 2013-02-26 (×4): qty 1

## 2013-02-26 MED ORDER — AMLODIPINE BESYLATE 10 MG PO TABS
10.0000 mg | ORAL_TABLET | Freq: Every day | ORAL | Status: DC
  Filled 2013-02-26: qty 1

## 2013-02-26 MED ORDER — FUROSEMIDE 80 MG PO TABS
160.0000 mg | ORAL_TABLET | Freq: Two times a day (BID) | ORAL | Status: DC
  Administered 2013-02-26: 08:00:00 160 mg via ORAL
  Filled 2013-02-26 (×3): qty 2

## 2013-02-26 MED ORDER — CLONIDINE HCL 0.1 MG PO TABS
0.1000 mg | ORAL_TABLET | Freq: Once | ORAL | Status: AC
  Administered 2013-02-26: 03:00:00 0.1 mg via ORAL
  Filled 2013-02-26: qty 1

## 2013-02-26 MED ORDER — FUROSEMIDE 10 MG/ML IJ SOLN
100.0000 mg | Freq: Two times a day (BID) | INTRAVENOUS | Status: DC
  Administered 2013-02-26 (×2): 100 mg via INTRAVENOUS
  Filled 2013-02-26 (×5): qty 10

## 2013-02-26 NOTE — Progress Notes (Signed)
02/25/13 7p-7a Patient is A/Ox4 and is ambulatory without assistance to the [facility]. Patient rested in bedside chair throughout the night with CPAP on. Notified by NT that patient's BP at 2001 was 213/73 with dinamap. Took pt.'s BP manually at it was 222/84 and HR 68. She had orders for prn labetalol for SBP>200 and HR 60 or more. PRN labetalol given. Rechecked BP/HR, BP was 208/78 and HR 75 so scheduled BP medications were given. Recheck patient's BP at 0040 with dinamap and got 220/78 HR of 71 rechecked again manually at got BP 216/78  And HR 67. Mid level provider on call with [facility] was called and notified Claiborne Billings, NP. Was told to go ahead and given prn labetalol gain and if SBP didn't decrease below 200 then to call gain. Gave patient prn labetalol. Rechecked BP again and it was 215/71 and HR 65.  Alpha Gula, NP again and notified. Order was written for clonidine 0.1 mg po for one dose. Gave patient clonidine. NT got patient's morning vitals and was told BP was 206/160 with dinamap and HR 61. Rechecked pt.'s BP manually and got 208/82 and HR of 60. Claiborne Billings, NP called again and notified was told it was okay to give prn labetalol again and that the physician will address blood pressure medications in the am. Patient has been asymptomatic during the shift.Only had a c/o back pain during shift change on 10/18 and prn morphine was given.

## 2013-02-26 NOTE — Progress Notes (Signed)
Patient resting quietly after receiving PM medications.

## 2013-02-26 NOTE — Progress Notes (Addendum)
[facility] PROGRESS NOTE Interim History: 57 year old female patient with history of congestive heart failure coronary artery disease as well as chronic kidney disease. She also has diabetes and chronic anemia related to her kidney disease. She presented to the hospital with complaints of shortness of breath and was found to have systolic blood pressure greater than 200. Chest x-ray was consistent with possible pulmonary edema.Due to the degree of respiratory failure she did require BiPAP in the emergency department. She was also temporarily started on IV nitroglycerin and IV Lasix was given with subsequent improvement in the patient's blood pressure and breathing therefore BiPAP was weaned off. Pt was doing quite well with diuresis and started having SOB on Wenesday, her lasix was increase and started on zaroxolyn. Her SOB improved and edema along with JVD was resolving. Then again Sunday her JVD increase edema worsen. I and O's not reliable. Her daughter is defensive and abusive over the phone.   Filed Weights   02/24/13 0420 02/25/13 0624 02/26/13 0700  Weight: 87.635 kg (193 lb 3.2 oz) 88.95 kg (196 lb 1.6 oz) 84.641 kg (186 lb 9.6 oz)        Intake/Output Summary (Last 24 hours) at 02/26/13 0835 Last data filed at 02/26/13 1610  Gross per 24 hour  Intake   1349 ml  Output    550 ml  Net    799 ml    Assessment/Plan: Acute respiratory failure with hypoxia due to, Flash pulmonary edema due to malingnant HTN, Acute biventricular diastolic CHF (congestive heart failure)-stage 3/HTN (hypertension), malignant : - Increase lasix, cont coreg. Cont Hydralazine and Imdur. Cont zaroxolyn. - I and O's not accurate, pt daughter bring her fluids. fluid restriction.  - echo did not show Pulm. HTN - Increase calcium channel blockers. Start clonidine. - tapered down steroids, SOB improved. - Worsening 3+ edema and JVD. - Spoke to her daughter over the phone was screaming on the phone and  blaming the nurses for what has happened to her daughter.   Chronic kidney disease (CKD), stage IV (severe)  -  Cr at baselinee - Has documented CKD IV  Anemia, chronic disease/Acute blood loss anemia  -hgb nadir 5.5, -CBC  -EGD revealed small multiple focal antral ulcers. PPI BID.  DM (diabetes mellitus)  -CBG elevated. - double Lantus to 30, due to steroids. - cont monitor CBG's.   Code Status: full Family Communication: none  Disposition Plan: inpatient   Consultants:  none  Procedures: ECHO: 10.8.2014: ejection fraction was 65%. Wall motion was normal; there were no regional wall motion abnormalities. Findings consistent with left ventricular diastolic dysfunction. Doppler parameters are consistent with high ventricular filling pressure   Antibiotics:  None   HPI/Subjective: Able to walk with out being SOB.  Objective: Filed Vitals:   02/26/13 0239 02/26/13 0500 02/26/13 0504 02/26/13 0700  BP: 215/71 206/160 208/82   Pulse: 65 61 60   Temp:  97.1 F (36.2 C)    TempSrc:      Resp:  18    Height:      Weight:    84.641 kg (186 lb 9.6 oz)  SpO2:  96%       Exam: General: Alert, awake, oriented x3, in no acute distress.  HEENT: No bruits, no goiter.worsening JVD Heart: Regular rate and rhythm. + 3 edema. Lungs: improved air movement, clear to auscultation Abdomen: Soft, nontender, nondistended, positive bowel sounds.     Data Reviewed: Basic Metabolic Panel:  Recent Labs Lab 02/22/13 0935  02/23/13 0600 02/24/13 0430 02/25/13 0445 02/26/13 0546  NA 141 139 139 141 141  K 3.6 4.3 4.1 3.8 3.5  CL 105 102 103 105 104  CO2 25 24 25 21 25   GLUCOSE 102* 252* 110* 194* 95  BUN 51* 62* 68* 77* 81*  CREATININE 2.49* 2.62* 2.67* 2.93* 2.83*  CALCIUM 8.2* 8.2* 8.2* 8.1* 8.1*   Liver Function Tests: No results found for this basename: AST, ALT, ALKPHOS, BILITOT, PROT, ALBUMIN,  in the last 168 hours No results found for this basename: LIPASE,  AMYLASE,  in the last 168 hours No results found for this basename: AMMONIA,  in the last 168 hours CBC:  Recent Labs Lab 02/21/13 0400 02/25/13 1110  WBC 7.1 13.0*  HGB 7.7* 8.9*  HCT 23.2* 26.7*  MCV 87.9 88.1  PLT 232 298   Cardiac Enzymes: No results found for this basename: CKTOTAL, CKMB, CKMBINDEX, TROPONINI,  in the last 168 hours BNP (last 3 results)  Recent Labs  02/14/13 2333 02/22/13 0709 02/26/13 0546  PROBNP 8501.0* 5090.0* 7568.0*   CBG:  Recent Labs Lab 02/25/13 1125 02/25/13 1220 02/25/13 1555 02/25/13 2123 02/26/13 0558  GLUCAP 71 85 165* 268* 86    No results found for this or any previous visit (from the past 240 hour(s)).   Studies: Dg Chest Port 1 View  02/26/2013   CLINICAL DATA:  Short of breath. History of congestive heart failure.  EXAM: PORTABLE CHEST - 1 VIEW  COMPARISON:  02/14/2013.  FINDINGS: Cardiac silhouette is mildly enlarged. The mediastinum is normal in contour.  The lungs are clear. Irregular interstitial thickening seen previously has resolved. No pleural effusion or pneumothorax.  The bony thorax is intact.  IMPRESSION: Presumed pulmonary edema seen previously has resolved. There is now no acute cardiopulmonary disease.   Electronically Signed   By: Amie Portland M.D.   On: 02/26/2013 08:10    Scheduled Meds: . aspirin EC  81 mg Oral Daily  . carvedilol  40 mg Oral Daily  . cloNIDine  0.2 mg Oral Q8H  . ferrous sulfate  325 mg Oral TID WC  . furosemide  160 mg Oral BID  . gabapentin  100 mg Oral BID  . hydrALAZINE  100 mg Oral TID  . insulin aspart  0-5 Units Subcutaneous QHS  . insulin aspart  0-9 Units Subcutaneous TID WC  . insulin aspart  8 Units Subcutaneous TID WC  . insulin glargine  25 Units Subcutaneous QHS  . isosorbide mononitrate  120 mg Oral Daily  . metolazone  5 mg Oral BID  . NIFEdipine  90 mg Oral Daily  . pantoprazole  40 mg Oral BID  . polyethylene glycol  17 g Oral Daily  . predniSONE  30 mg Oral  Q breakfast  . sodium chloride  3 mL Intravenous Q12H   Continuous Infusions:    Marinda Elk  [facility] Pager [phone]. If 8PM-8AM, please contact night-coverage at [email or web address], password [facility] 02/26/2013, 8:35 AM  LOS: 12 days

## 2013-02-27 ENCOUNTER — Encounter (HOSPITAL_COMMUNITY): Payer: Self-pay | Admitting: Physician Assistant

## 2013-02-27 LAB — GLUCOSE, CAPILLARY
Glucose-Capillary: 110 mg/dL — ABNORMAL HIGH (ref 70–99)
Glucose-Capillary: 104 mg/dL — ABNORMAL HIGH (ref 70–99)
Glucose-Capillary: 168 mg/dL — ABNORMAL HIGH (ref 70–99)
Glucose-Capillary: 132 mg/dL — ABNORMAL HIGH (ref 70–99)

## 2013-02-27 LAB — BASIC METABOLIC PANEL
Calcium: 8 mg/dL — ABNORMAL LOW (ref 8.4–10.5)
Creatinine, Ser: 2.85 mg/dL — ABNORMAL HIGH (ref 0.50–1.10)
GFR calc Af Amer: 20 mL/min — ABNORMAL LOW (ref >90)
GFR calc non Af Amer: 18 mL/min — ABNORMAL LOW (ref >90)
Glucose, Bld: 125 mg/dL — ABNORMAL HIGH (ref 70–99)
Potassium: 3.3 mEq/L — ABNORMAL LOW (ref 3.5–5.1)
Sodium: 141 mEq/L (ref 135–145)

## 2013-02-27 MED ORDER — POTASSIUM CHLORIDE CRYS ER 20 MEQ PO TBCR
40.0000 meq | EXTENDED_RELEASE_TABLET | Freq: Once | ORAL | Status: AC
  Administered 2013-02-27: 40 meq via ORAL
  Filled 2013-02-27: qty 2

## 2013-02-27 MED ORDER — INSULIN GLARGINE 100 UNIT/ML ~~LOC~~ SOLN
18.0000 [IU] | Freq: Every day | SUBCUTANEOUS | Status: DC
  Administered 2013-02-27 – 2013-03-01 (×3): 18 [IU] via SUBCUTANEOUS
  Filled 2013-02-27 (×4): qty 0.18

## 2013-02-27 MED ORDER — FUROSEMIDE 10 MG/ML IJ SOLN
60.0000 mg | Freq: Two times a day (BID) | INTRAMUSCULAR | Status: DC
  Administered 2013-02-27: 15:00:00 60 mg via INTRAVENOUS
  Filled 2013-02-27 (×2): qty 6

## 2013-02-27 MED ORDER — FUROSEMIDE 10 MG/ML IJ SOLN
10.0000 mg/h | INTRAVENOUS | Status: DC
  Administered 2013-02-27 – 2013-03-02 (×3): 10 mg/h via INTRAVENOUS
  Filled 2013-02-27 (×6): qty 25

## 2013-02-27 NOTE — Progress Notes (Addendum)
[facility] PROGRESS NOTE Interim History: 57 year old female patient with history of congestive heart failure coronary artery disease as well as chronic kidney disease. She also has diabetes and chronic anemia related to her kidney disease. She presented to the hospital with complaints of shortness of breath and was found to have systolic blood pressure greater than 200. Chest x-ray was consistent with possible pulmonary edema.Due to the degree of respiratory failure she did require BiPAP in the emergency department. She was also temporarily started on IV nitroglycerin and IV Lasix was given with subsequent improvement in the patient's blood pressure and breathing therefore BiPAP was weaned off. Pt was doing quite well with diuresis but then started having SOB on Wenesday, her lasix was increase and started on zaroxolyn. Her SOB improved and edema along with JVD was resolving. Then again Sunday her JVD increase and edema worsen. I and O's not reliable. ? Compliance with fluid restriction Cardiology was consulted for RHC as was nephrology for ? proteniuria   Filed Weights   02/25/13 4098 02/26/13 0700 02/27/13 0500  Weight: 88.95 kg (196 lb 1.6 oz) 84.641 kg (186 lb 9.6 oz) 82.7 kg (182 lb 5.1 oz)        Intake/Output Summary (Last 24 hours) at 02/27/13 0913 Last data filed at 02/27/13 1191  Gross per 24 hour  Intake    680 ml  Output   4000 ml  Net  -3320 ml    Assessment/Plan: Acute respiratory failure with hypoxia due to, Flash pulmonary edema due to malingnant HTN, Acute biventricular diastolic CHF (congestive heart failure)-stage 3/HTN (hypertension), malignant : - Increase lasix, cont coreg. Cont Hydralazine and Imdur. Cont zaroxolyn. - I and O's not accurate, pt daughter bring her fluids. fluid restriction.  - Increase calcium channel blockers. - tapered down steroids, SOB improved. -off O2 -asked cardiology to see as she is high risk of bouncing back if she does not have  proper follow up -moves here from [facility]- reviewed, records on chart -JAN 2014- patient weighed 170.2    Chronic kidney disease (CKD), stage IV (severe)  -  Cr at baseline? - Has documented CKD IV- per noted from [place]  Anemia, chronic disease/Acute blood loss anemia  -hgb nadir 5.5, -CBC  -EGD revealed small multiple focal antral ulcers. PPI BID.  DM (diabetes mellitus)  -decrease lantus - cont monitor CBG's.  Hypokalemia -replace  Code Status: full Family Communication: none  Disposition Plan: inpatient   Consultants:  cards  Procedures: ECHO: 10.8.2014: ejection fraction was 65%. Wall motion was normal; there were no regional wall motion abnormalities. Findings consistent with left ventricular diastolic dysfunction. Doppler parameters are consistent with high ventricular filling pressure  abd U/S 02/28/13  Antibiotics:  None   HPI/Subjective: C/o "vomiting" small amounts of phlegm almost every AM  Objective: Filed Vitals:   02/26/13 1338 02/26/13 1633 02/26/13 2050 02/27/13 0500  BP: 200/68 154/74 167/61 169/82  Pulse: 57  63 70  Temp: 97.6 F (36.4 C)  98.9 F (37.2 C) 98 F (36.7 C)  TempSrc: Oral  Oral Oral  Resp: 19  20 20   Height:      Weight:    82.7 kg (182 lb 5.1 oz)  SpO2: 100%  100% 100%     Exam: General: Alert, awake, oriented x3, in no acute distress.  HEENT: No bruits, no goiter.worsening JVD Heart: Regular rate and rhythm. + 3 edema with scaling of her skin Lungs: diminished, no wheezing Abdomen: Soft, nontender, nondistended, positive bowel  sounds.     Data Reviewed: Basic Metabolic Panel:  Recent Labs Lab 02/23/13 0600 02/24/13 0430 02/25/13 0445 02/26/13 0546 02/27/13 0631  NA 139 139 141 141 141  K 4.3 4.1 3.8 3.5 3.3*  CL 102 103 105 104 103  CO2 24 25 21 25 27   GLUCOSE 252* 110* 194* 95 125*  BUN 62* 68* 77* 81* 84*  CREATININE 2.62* 2.67* 2.93* 2.83* 2.85*  CALCIUM 8.2* 8.2* 8.1* 8.1* 8.0*   Liver  Function Tests: No results found for this basename: AST, ALT, ALKPHOS, BILITOT, PROT, ALBUMIN,  in the last 168 hours No results found for this basename: LIPASE, AMYLASE,  in the last 168 hours No results found for this basename: AMMONIA,  in the last 168 hours CBC:  Recent Labs Lab 02/21/13 0400 02/25/13 1110  WBC 7.1 13.0*  HGB 7.7* 8.9*  HCT 23.2* 26.7*  MCV 87.9 88.1  PLT 232 298   Cardiac Enzymes: No results found for this basename: CKTOTAL, CKMB, CKMBINDEX, TROPONINI,  in the last 168 hours BNP (last 3 results)  Recent Labs  02/14/13 2333 02/22/13 0709 02/26/13 0546  PROBNP 8501.0* 5090.0* 7568.0*   CBG:  Recent Labs Lab 02/26/13 0558 02/26/13 1053 02/26/13 1555 02/26/13 2118 02/27/13 0553  GLUCAP 86 92 81 207* 110*    No results found for this or any previous visit (from the past 240 hour(s)).   Studies: Dg Chest Port 1 View  02/26/2013   CLINICAL DATA:  Short of breath. History of congestive heart failure.  EXAM: PORTABLE CHEST - 1 VIEW  COMPARISON:  02/14/2013.  FINDINGS: Cardiac silhouette is mildly enlarged. The mediastinum is normal in contour.  The lungs are clear. Irregular interstitial thickening seen previously has resolved. No pleural effusion or pneumothorax.  The bony thorax is intact.  IMPRESSION: Presumed pulmonary edema seen previously has resolved. There is now no acute cardiopulmonary disease.   Electronically Signed   By: Amie Portland M.D.   On: 02/26/2013 08:10    Scheduled Meds: . aspirin EC  81 mg Oral Daily  . carvedilol  40 mg Oral Daily  . ferrous sulfate  325 mg Oral TID WC  . furosemide  60 mg Intravenous BID  . gabapentin  100 mg Oral BID  . hydrALAZINE  100 mg Oral TID  . insulin aspart  0-5 Units Subcutaneous QHS  . insulin aspart  0-9 Units Subcutaneous TID WC  . insulin aspart  8 Units Subcutaneous TID WC  . insulin glargine  18 Units Subcutaneous QHS  . isosorbide mononitrate  120 mg Oral Daily  . metolazone  5 mg Oral  BID  . NIFEdipine  60 mg Oral Daily  . pantoprazole  40 mg Oral BID  . polyethylene glycol  17 g Oral Daily  . predniSONE  10 mg Oral Q breakfast  . sodium chloride  3 mL Intravenous Q12H   Continuous Infusions:    Marlin Canary  [facility] Pager [phone]. If 8PM-8AM, please contact night-coverage at [email or web address], password [facility] 02/27/2013, 9:13 AM  LOS: 13 days

## 2013-02-27 NOTE — Progress Notes (Signed)
Patient up in chair for lunch and tolerating well. Notified by nurse that daughter called and has some concerns with patient taking clonidine and coreg and wanted to speak with attending doctor. Text paged attending MD of this matter. Will continue to monitor to end of shift.

## 2013-02-27 NOTE — Consult Note (Signed)
Cardiology Consultation Note  Patient ID: Madeline [name], MRN: [id number], DOB/AGE: 1955-05-28 57 y.o. Admit date: 02/14/2013   Date of Consult: 02/27/2013 Primary Physician: Pcp Not In System Primary Cardiologist: New to [facility]. Previously seen in [facility]  Chief Complaint: SOB Reason for Consult: CHF  HPI: Madeline [name] is a 57 y/o F with history of HFpEF (RHF/anasarca with pulmonary HTN), CKD stage 3, suspected CAD, DM, chronic anemia, OSA (not recently using CPAP) who is originally from [facility] who moved to [facility] in May 2014. Her daughter is a Film/video editor at [employer]. She presented to [facility] 02/14/2013 with acute respiratory failure requiring Bipap and acute on chronic combined CHF. I reviewed extensive records from [facility] - it looks like she presented there in 01/2012 with NSTEMI, pulm HTN and SOB. She underwent CTA which was negative for PE but resulted in ARF requiring temporary dialysis (1 session). Invasive ischemic evaluation was not pursued. She reports remote heart cath in 1980s but none recently including RHC. She does have h/o prior normal Lexiscan nuc in 12/2011 (before that hospitalization). She has had several admissions to the hospital for CHF, CP rule-out, and one in 09/2012 for metabolic encephalopathy with question of aseptic meningitis, CVA (with reportedly negative MRI), oliguric ATN/ARF, and CHF. Her renal function is historically referred to as CKD stage III with frequent worsening of insufficiency with diuresis. Her Cr in 2013 was 1.3 but more recently has run in the 2.5-2.8 range with levels up to 4 with diuresis.  She presented here on 02/14/2013 with progressive SOB x 2 weeks as well as nausea, vomiting, LEE, intermittent chest tightness (nonexertional), occasional hematemesis, uncontrolled BP, & marked hyperglycemia. 2D echo 02/15/13 demonstrated preserved EF 65%, +diastolic dysfunction, PA pressure , moderately dilated RV and moderately reduced systolic  function. She was treated with IV diuresis and BP med titration. Weight now down 20 pounds. She was found to be progressively anemic during admission with +FOBT and Hgb nadir 5.5 requiring blood transfusion and IV iron. EGD showed edematous gastric atrum with scattered focal ulcers, so she's now on PPI. Aspirin has been restarted. CT abdomen suggestive of third spacing with soft tissue edema, pleural effusions and ascites. By records it looks like she's thought to have CKD stage III with a/c renal insufficiency this admission. Albumin is down to 1.3 and UA reports >300 protein on adm. With diuresis and multiple blood pressure medication escalations, wt 202->182, -16L this adm but BP remains elevated. She was initially doing well with diuresis but on 10/15 developed worsening SOB thus diuretics were increased and Zaroxolyn was started. She had worsening edema and JVD again on 10/19. Troponin neg x 3. Notes indicate there was concern that family was bringing patient additional beverages. The patient feels better today but is still c/o abdominal tightness and occasional dyspnea. CP seems to have resolved as fluid has been removed. Cr remains up at 2.85.  Past Medical History  Diagnosis Date  . CHF (congestive heart failure)     a. HFpEF with RHF/anasarca/pHTN.  . COPD (chronic obstructive pulmonary disease)   . Coronary artery disease     a. Per [place] records: NSTEMI 01/2012, tx medically given ARF but suspected CAD. b. Stress test 12/16/11 reported w/o ischemia.  . Hypertension   . Diabetes mellitus without complication   . OSA (obstructive sleep apnea)     a. Pt reported used to use CPAP in , but "ran out" when came to [facility].  Marland Kitchen CKD (chronic kidney  disease), stage III     a. Per [place] records: h/o ARF after CTA that ruled out PE.  Marland Kitchen Pulmonary hypertension   . Right heart failure   . Anasarca     a. Per [place] records - due to pulm HTN with R HF.   Marland Kitchen Aseptic meningitis     a. 09/2012: adm in [place] for metabolic  encephalopathy, oliguric tubular necrosis, anemia, HTN, possible CVA, HHNKA.  Marland Kitchen CVA (cerebral infarction)     a. Per [place] records, "possible CVA" 09/2012 but MRI reportedly negative.  . Abnormal Doppler ultrasound of carotid artery     a. Per [place] records: <50% LICA.      Most Recent Cardiac Studies: 2D Echo 02/15/13 at [facility] - Left ventricle: The cavity size was normal. Wall thickness was increased in a pattern of moderate to severe LVH. The estimated ejection fraction was 65%. Wall motion was normal; there were no regional wall motion abnormalities. Findings consistent with left ventricular diastolic dysfunction. Doppler parameters are consistent with high ventricular filling pressure. - Left atrium: The atrium was mildly dilated. - Right ventricle: The cavity size was moderately dilated. Systolic function was moderately reduced. - Right atrium: The atrium was mildly dilated. - Pulmonary arteries: PA peak pressure: 66mm Hg (S).  PER [place] RECORDS: 1) 2D Echo 09/27/2012 LV size normal, mod-severe hypertrophy, EF 60-65%, no RWMA, grade 1 diastolic dysfucntion, AV sclerosis without stenosis, MAC with mildly thickened leaflets, LA slightly dilated, RV with estimated pressure , R atrium mild-mod dilated, mild-mod TR 2) Carotid Dopplers per [place] note 09/27/2012: less than 50% LICA. 3) Renal US 09/30/12 - mild chronic medical renal disease on R.. Minimal calyceal prominence bilaterally, ascites. 3) Stress Lexiscan 12/16/11 - pt had SOB and CP, no ischemia with EF 75%   Surgical History:  Past Surgical History  Procedure Laterality Date  . Tubal ligation    . Abdominal hysterectomy    . Esophagogastroduodenoscopy N/A 02/16/2013    Procedure: ESOPHAGOGASTRODUODENOSCOPY (EGD);  Surgeon: Graylin Shiver, MD;  Location[facility] ENDOSCOPY;  Service: Endoscopy;  Laterality: N/A;     Home Meds: Prior to Admission medications   Medication Sig Start Date End Date Taking? Authorizing Provider  albuterol  (PROVENTIL HFA;VENTOLIN HFA) 108 (90 BASE) MCG/ACT inhaler Inhale 2 puffs into the lungs every 6 (six) hours as needed for wheezing or shortness of breath.    Yes Historical Provider, MD  aspirin EC 81 MG tablet Take 81 mg by mouth daily.   Yes Historical Provider, MD  ferrous sulfate 325 (65 FE) MG tablet Take 325 mg by mouth daily with breakfast.   Yes Historical Provider, MD  furosemide (LASIX) 40 MG tablet Take 40 mg by mouth daily.   Yes Historical Provider, MD  gabapentin (NEURONTIN) 300 MG capsule Take 300 mg by mouth 2 (two) times daily.   Yes Historical Provider, MD  hydrALAZINE (APRESOLINE) 50 MG tablet Take 50 mg by mouth 3 (three) times daily.   Yes Historical Provider, MD  insulin lispro (HUMALOG) 100 UNIT/ML injection Inject 0-10 Units into the skin 3 (three) times daily before meals. Pt on sliding scale   Yes Historical Provider, MD  isosorbide mononitrate (IMDUR) 120 MG 24 hr tablet Take 120 mg by mouth daily.   Yes Historical Provider, MD  NIFEdipine (PROCARDIA XL/ADALAT-CC) 60 MG 24 hr tablet Take 60 mg by mouth daily.   Yes Historical Provider, MD  nitroGLYCERIN (NITROSTAT) 0.4 MG SL tablet Place 0.4 mg under the tongue every  5 (five) minutes as needed for chest pain.    Yes Historical Provider, MD  omeprazole (PRILOSEC) 20 MG capsule Take 20 mg by mouth daily.   Yes Historical Provider, MD  traMADol (ULTRAM) 50 MG tablet Take 50 mg by mouth 3 (three) times daily as needed for pain.   Yes Historical Provider, MD    Inpatient Medications:  . aspirin EC  81 mg Oral Daily  . carvedilol  40 mg Oral Daily  . ferrous sulfate  325 mg Oral TID WC  . furosemide  60 mg Intravenous BID  . gabapentin  100 mg Oral BID  . hydrALAZINE  100 mg Oral TID  . insulin aspart  0-5 Units Subcutaneous QHS  . insulin aspart  0-9 Units Subcutaneous TID WC  . insulin aspart  8 Units Subcutaneous TID WC  . insulin glargine  18 Units Subcutaneous QHS  . isosorbide mononitrate  120 mg Oral Daily  .  metolazone  5 mg Oral BID  . NIFEdipine  60 mg Oral Daily  . pantoprazole  40 mg Oral BID  . polyethylene glycol  17 g Oral Daily  . potassium chloride  40 mEq Oral Once  . predniSONE  10 mg Oral Q breakfast  . sodium chloride  3 mL Intravenous Q12H      Allergies: No Known Allergies  History   Social History  . Marital Status: Widowed    Spouse Name: N/A    Number of Children: N/A  . Years of Education: N/A   Occupational History  . Not on file.   Social History Main Topics  . Smoking status: Former Games developer  . Smokeless tobacco: Current User    Types: Snuff, Chew  . Alcohol Use: No  . Drug Use: No  . Sexual Activity: No   Other Topics Concern  . Not on file   Social History Narrative  . No narrative on file     Family History  Problem Relation Age of Onset  . Diabetes Mellitus II Sister   . Diabetes Mellitus II Brother   . CAD Brother      Review of Systems: General: negative for chills, fever Cardiovascular: see above Dermatological: negative for rash Respiratory: has COPD. Does not use home O2.  Urologic: negative for hematuria Abdominal: negative for diarrhea, bright red blood per rectum, melena per pt observation Neurologic: negative for visual changes, syncope, or dizziness All other systems reviewed and are otherwise negative except as noted above.  Labs: troponins neg thus far Lab Results  Component Value Date   WBC 13.0* 02/25/2013   HGB 8.9* 02/25/2013   HCT 26.7* 02/25/2013   MCV 88.1 02/25/2013   PLT 298 02/25/2013     Recent Labs Lab 02/27/13 0631  NA 141  K 3.3*  CL 103  CO2 27  BUN 84*  CREATININE 2.85*  CALCIUM 8.0*  GLUCOSE 125*   Radiology/Studies:  Ct Abdomen Pelvis Wo Contrast  02/15/2013   CLINICAL DATA:  Low back pain, anemia. Clinical concern for hydronephrosis.  EXAM: CT ABDOMEN AND PELVIS WITHOUT CONTRAST  TECHNIQUE: Multidetector CT imaging of the abdomen and pelvis was performed following the standard protocol  without intravenous contrast.  COMPARISON:  None.  FINDINGS: Unenhanced CT was performed per clinician order. Lack of IV contrast limits sensitivity and specificity, especially for evaluation of abdominal/pelvic solid viscera. Exam detail is suboptimal due to patient body habitus. Trace pleural effusions are partly visualized. Cardiomegaly noted. There is diffuse soft tissue edema as  well as trace ascites. Motion artifact persists throughout the length of the scan with extensive streak artifact from the patient's arms.  Abdominal viscera are grossly unremarkable. There is subjective fullness in the porta hepatis region but this is poorly evaluated at the current suboptimal quality exam. No gross evidence for bowel dilatation. Small amount of free pelvic fluid is identified. The bladder is decompressed with a Foley catheter in place. Colonic diverticuli noted without evidence for diverticulitis. No small bowel dilatation. Degenerative changes are noted in the spine.  IMPRESSION: Extremely suboptimal exam due to patient motion, habitus, and arm positioning at the sides. No gross evidence for hydronephrosis as per the clinical question or other acute intra-abdominal abnormality. Third spacing with soft tissue edema, pleural effusions, and ascites.   Electronically Signed   By: Christiana Pellant M.D.   On: 02/15/2013 12:48   Dg Chest Port 1 View 02/26/2013   CLINICAL DATA:  Short of breath. History of congestive heart failure.  EXAM: PORTABLE CHEST - 1 VIEW  COMPARISON:  02/14/2013.  FINDINGS: Cardiac silhouette is mildly enlarged. The mediastinum is normal in contour.  The lungs are clear. Irregular interstitial thickening seen previously has resolved. No pleural effusion or pneumothorax.  The bony thorax is intact.  IMPRESSION: Presumed pulmonary edema seen previously has resolved. There is now no acute cardiopulmonary disease.   Electronically Signed   By: Amie Portland M.D.   On: 02/26/2013 08:10   Dg Chest Port  1 View 02/14/2013   *RADIOLOGY REPORT*  Clinical Data: Shortness of breath  PORTABLE CHEST - 1 VIEW  Comparison: 04/05/2012  Findings:  Grossly unchanged enlarged cardiac silhouette and mediastinal contours.  Atherosclerotic calcifications within the thoracic aorta.  Pulmonary vasculature is indistinct with cephalization of flow.  Worsening perihilar and bibasilar heterogeneous opacities, left greater than right.  No definite pleural effusion or pneumothorax.  Grossly unchanged bones.  IMPRESSION:  Overall findings most worrisome for pulmonary edema, though note, atypical infection may have a similar appearance.  A follow-up chest radiograph in 4 to 6 weeks after treatment is recommended to ensure resolution.   Original Report Authenticated By: Tacey Ruiz, MD   EKG:  02/15/13: NSR 82bpm low voltage QRS, nonspecific T wave change III  Physical Exam Blood pressure 169/82, pulse 70, temperature 98 F (36.7 C), temperature source Oral, resp. rate 20, height 4\' 9"  (1.448 m), weight 182 lb 5.1 oz (82.7 kg), SpO2 100.00%. General: Well developed, well nourished AAF in no acute distress. Head: Normocephalic, atraumatic, sclera non-icteric, no xanthomas, nares are without discharge.  Neck: R carotid bruit. JVD 9-10 Lungs: Clear bilaterally to auscultation without wheezes, rales, or rhonchi. Breathing is unlabored. Heart: RRR with S1 S2. 2/6 SEM worse with inspiration (TR) .No rubs or gallops appreciated. Abdomen: Soft, non-tender, rounded with normoactive bowel sounds. +distended No hepatomegaly. No rebound/guarding. No obvious abdominal masses. Msk:  Strength and tone appear normal for age. Extremities: No clubbing or cyanosis. 3-4+ bilat LEE edema with scaling skin changes. Distal pedal pulses are 2+ and equal bilaterally. Neuro: Alert and oriented X 3. No facial asymmetry. No focal deficit. Moves all extremities spontaneously. Psych:  Responds to questions appropriately with a normal affect.    Assessment and Plan:  1. Acute on chronic diastolic CHF with right sided HF 2. Pulmonary hypertension 3. Acute on chronic kidney disease 4. Persistent hypertension 5. Acute on chronic anemia with iron deficiency s/p PRBC/IV iron 6. Severe hypoalbuminemia with proteinuria 7. Gastric ulcer disease 8. OSA, noncompliant with  CPAP prior to admission 9. COPD 10. Diabetes mellitus  The patient would likely benefit from a RHC to assess R sided pressures and further assess pulm HTN. Would continue current diuretic as ordered, but consider further workup for nephrotic syndrome given resistant hypertension, hypoalbuminemia/hypoproteinemia, and UA with >300 protein. Despite high doses of multiple hypertensives, BP remains high. Has not yet had 24-hour urine and it's not clear to what degree she's had her CKD fully evaluated in the past. She's had renal dopplers but I do not see renal artery duplex. See MD note for additional thoughts.  Signed, Ronie Spies PA-C 02/27/2013, 11:27 AM  Patient seen and examined with Ronie Spies, PA-C. We discussed all aspects of the encounter. I agree with the assessment and plan as stated above. Madeline [name] has acute on chronic failure RHF in the setting of moderate pulmonary HTN on echo. Suspect her PH is secondary to OHS, OSA and longstanding LV diastolic dysfunction. Her situation is complicated by progressive renal failure in the setting of poorly controlled DM2 and HTN now with GFR < 20 and hypoalbuminemia. Renal input much appreciated.  She has been diuresed about 20 pounds but remains markedly overloaded. I suspect she will likely required HD over the next 6-12 months to manage her volume status effectively. For now, we will place TED hose and start lasix drip to promote slow and consistent diuresis. We did discuss RHC to further evaluate PH and we will consider doing that later this week once volume status improves. On noncontrast CT she appears to have extensive  calcification near the takeoff of the L renal artery so we will order renal artery u/s though body habitus will make this difficult.  Stressed the need for compliance with CPAP and anti-HTN regimen.   Discussed with her daughter who was in the room.   Truman Hayward 6:24 PM

## 2013-02-27 NOTE — Consult Note (Signed)
Reason for Consult:AKI/CKD, proteinuria, anasarca Referring Physician: Benjamine Mola, DO  Madeline [name] is an 57 y.o. female.  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  Pt was admitted 2 weeks ago when she presented with increasing SOB and lower ext edema.  She has been diuresed 16kg during this admission with improvement of her symptoms, however we were consulted to further evaluate the possibility of nephrotic syndrome contributing to her anasarca as evidenced by her proteinuria and hypoalbuminemia.  Her trend in Scr is seen below.  Last known Scr at OSH was 1.1 on 10/12/12.  Trend in Creatinine: Creatinine, Ser  Date/Time Value Range Status  02/27/2013  6:31 AM 2.85* 0.50 - 1.10 mg/dL Final  47/82/9562  1:30 AM 2.83* 0.50 - 1.10 mg/dL Final  86/57/8469  6:29 AM 2.93* 0.50 - 1.10 mg/dL Final  52/84/1324  4:01 AM 2.67* 0.50 - 1.10 mg/dL Final  02/72/5366  4:40 AM 2.62* 0.50 - 1.10 mg/dL Final  34/74/2595  6:38 AM 2.49* 0.50 - 1.10 mg/dL Final  75/64/3329  5:18 AM 2.47* 0.50 - 1.10 mg/dL Final  84/16/6063  0:16 AM 2.44* 0.50 - 1.10 mg/dL Final  05/19/3233  5:73 AM 2.41* 0.50 - 1.10 mg/dL Final  22/06/5425 06:23 AM 2.40* 0.50 - 1.10 mg/dL Final  76/06/8313  1:76 PM 2.44* 0.50 - 1.10 mg/dL Final  16/0/7371  0:62 AM 2.46* 0.50 - 1.10 mg/dL Final  69/08/8544  2:70 PM 2.56* 0.50 - 1.10 mg/dL Final  35/0/0938 18:29 AM 2.59* 0.50 - 1.10 mg/dL Final  01/12/7168 67:89 PM 1.96* 0.50 - 1.10 mg/dL Final  38/02/1750  0:25 AM 1.21* 0.50 - 1.10 mg/dL Final  85/27/7824 23:53 PM 1.16* 0.50 - 1.10 mg/dL Final    PMH:   Past Medical History  Diagnosis Date  . CHF (congestive heart failure)     a. HFpEF with RHF/anasarca/pHTN.  . COPD (chronic obstructive pulmonary disease)   . Coronary artery disease     a. Per [place] records: NSTEMI 01/2012, tx medically given ARF but  suspected CAD. b. Stress test 12/16/11 reported w/o ischemia.  . Hypertension   . Diabetes mellitus   . OSA (obstructive sleep apnea)     a. Pt reported used to use CPAP in [place], but "ran out" when came to [facility].  Marland Kitchen CKD (chronic kidney disease), stage III     a. Per [place] records: h/o ARF after CTA that ruled out PE.  Marland Kitchen Pulmonary hypertension   . Right heart failure   . Anasarca     a. Per [place] records - due to pulm HTN with R HF.   Marland Kitchen Aseptic meningitis     a. 09/2012: adm in [place] for metabolic encephalopathy, oliguric tubular necrosis, anemia, HTN, possible CVA, HHNKA.  Marland Kitchen CVA (cerebral infarction)     a. Per [place] records, "possible CVA" 09/2012 but MRI reportedly negative.  . Abnormal Doppler ultrasound of carotid artery     a. Per [place] records: <50% LICA.    PSH:   Past Surgical History  Procedure Laterality Date  . Tubal ligation    . Abdominal hysterectomy    . Esophagogastroduodenoscopy N/A 02/16/2013    Procedure: ESOPHAGOGASTRODUODENOSCOPY (EGD);  Surgeon: Graylin Shiver, MD;  [facility]: [facility] ENDOSCOPY;  Service: Endoscopy;  Laterality: N/A;    Allergies: No Known Allergies  Medications:   Prior to  Admission medications   Medication Sig Start Date End Date Taking? Authorizing Provider  albuterol (PROVENTIL HFA;VENTOLIN HFA) 108 (90 BASE) MCG/ACT inhaler Inhale 2 puffs into the lungs every 6 (six) hours as needed for wheezing or shortness of breath.    Yes Historical Provider, MD  aspirin EC 81 MG tablet Take 81 mg by mouth daily.   Yes Historical Provider, MD  ferrous sulfate 325 (65 FE) MG tablet Take 325 mg by mouth daily with breakfast.   Yes Historical Provider, MD  furosemide (LASIX) 40 MG tablet Take 40 mg by mouth daily.   Yes Historical Provider, MD  gabapentin (NEURONTIN) 300 MG capsule Take 300 mg by mouth 2 (two) times daily.   Yes Historical Provider, MD  hydrALAZINE (APRESOLINE) 50 MG tablet Take 50 mg by mouth 3 (three) times daily.   Yes Historical Provider, MD  insulin lispro  (HUMALOG) 100 UNIT/ML injection Inject 0-10 Units into the skin 3 (three) times daily before meals. Pt on sliding scale   Yes Historical Provider, MD  isosorbide mononitrate (IMDUR) 120 MG 24 hr tablet Take 120 mg by mouth daily.   Yes Historical Provider, MD  NIFEdipine (PROCARDIA XL/ADALAT-CC) 60 MG 24 hr tablet Take 60 mg by mouth daily.   Yes Historical Provider, MD  nitroGLYCERIN (NITROSTAT) 0.4 MG SL tablet Place 0.4 mg under the tongue every 5 (five) minutes as needed for chest pain.    Yes Historical Provider, MD  omeprazole (PRILOSEC) 20 MG capsule Take 20 mg by mouth daily.   Yes Historical Provider, MD  traMADol (ULTRAM) 50 MG tablet Take 50 mg by mouth 3 (three) times daily as needed for pain.   Yes Historical Provider, MD    Inpatient medications: . aspirin EC  81 mg Oral Daily  . carvedilol  40 mg Oral Daily  . ferrous sulfate  325 mg Oral TID WC  . furosemide  60 mg Intravenous BID  . gabapentin  100 mg Oral BID  . hydrALAZINE  100 mg Oral TID  . insulin aspart  0-5 Units Subcutaneous QHS  . insulin aspart  0-9 Units Subcutaneous TID WC  . insulin aspart  8 Units Subcutaneous TID WC  . insulin glargine  18 Units Subcutaneous QHS  . isosorbide mononitrate  120 mg Oral Daily  . metolazone  5 mg Oral BID  . NIFEdipine  60 mg Oral Daily  . pantoprazole  40 mg Oral BID  . polyethylene glycol  17 g Oral Daily  . predniSONE  10 mg Oral Q breakfast  . sodium chloride  3 mL Intravenous Q12H    Discontinued Meds:   Medications Discontinued During This Encounter  Medication Reason  . furosemide (LASIX) injection 60 mg   . aspirin EC tablet 81 mg   . furosemide (LASIX) injection 40 mg   . midazolam (VERSED) injection Patient Discharge  . fentaNYL (SUBLIMAZE) injection Patient Discharge  . butamben-tetracaine-benzocaine (CETACAINE) spray Patient Discharge  . 0.9 %  sodium chloride infusion   . hydrALAZINE (APRESOLINE) tablet 50 mg   . insulin aspart (novoLOG) injection 0-9  Units   . nitroGLYCERIN 0.2 mg/mL in dextrose 5 % infusion   . insulin aspart (novoLOG) injection 0-15 Units   . pantoprazole (PROTONIX) injection 40 mg   . pantoprazole (PROTONIX) EC tablet 40 mg   . labetalol (NORMODYNE) tablet 200 mg   . insulin glargine (LANTUS) injection 20 Units   . insulin aspart (novoLOG) injection 10 Units   . sodium chloride 0.9 %  injection 3 mL   . furosemide (LASIX) injection 40 mg   . gabapentin (NEURONTIN) capsule 300 mg   . hydrALAZINE (APRESOLINE) tablet 50 mg   . carvedilol (COREG CR) 24 hr capsule 20 mg   . furosemide (LASIX) injection 40 mg   . furosemide (LASIX) injection 60 mg   . insulin glargine (LANTUS) injection 16 Units   . ferrous sulfate tablet 325 mg   . insulin glargine (LANTUS) injection 30 Units   . insulin aspart (novoLOG) injection 0-5 Units Duplicate  . insulin glargine (LANTUS) injection 16 Units   . insulin glargine (LANTUS) injection 30 Units   . insulin glargine (LANTUS) injection 20 Units   . metolazone (ZAROXOLYN) tablet 5 mg   . metolazone (ZAROXOLYN) tablet 5 mg   . furosemide (LASIX) injection 80 mg   . methylPREDNISolone sodium succinate (SOLU-MEDROL) 125 mg/2 mL injection 80 mg   . insulin aspart (novoLOG) injection 13 Units   . furosemide (LASIX) injection 80 mg   . metolazone (ZAROXOLYN) tablet 5 mg   . predniSONE (DELTASONE) tablet 40 mg   . insulin glargine (LANTUS) injection 30 Units   . furosemide (LASIX) tablet 100 mg   . amLODipine (NORVASC) tablet 10 mg   . NIFEdipine (PROCARDIA-XL/ADALAT CC) 24 hr tablet 60 mg   . predniSONE (DELTASONE) tablet 30 mg   . cloNIDine (CATAPRES) tablet 0.2 mg   . furosemide (LASIX) tablet 160 mg   . NIFEdipine (PROCARDIA XL/ADALAT-CC) 24 hr tablet 90 mg   . furosemide (LASIX) 160 mg in dextrose 5 % 50 mL IVPB   . furosemide (LASIX) 100 mg in dextrose 5 % 50 mL IVPB   . morphine 2 MG/ML injection 1 mg   . insulin glargine (LANTUS) injection 25 Units     Social History:   reports that she has quit smoking. Her smokeless tobacco use includes Snuff and Chew. She reports that she does not drink alcohol or use illicit drugs.  Family History:   Family History  Problem Relation Age of Onset  . Diabetes Mellitus II Sister   . Diabetes Mellitus II Brother   . CAD Brother     Pertinent items are noted in HPI. Weight change: -1.941 kg (-4 lb 4.5 oz)  Intake/Output Summary (Last 24 hours) at 02/27/13 1538 Last data filed at 02/27/13 1429  Gross per 24 hour  Intake    690 ml  Output   3400 ml  Net  -2710 ml   BP 157/62  Pulse 64  Temp(Src) 98.3 F (36.8 C) (Oral)  Resp 20  Ht 4\' 9"  (1.448 m)  Wt 82.7 kg (182 lb 5.1 oz)  BMI 39.44 kg/m2  SpO2 99% Filed Vitals:   02/26/13 2050 02/27/13 0500 02/27/13 1158 02/27/13 1429  BP: 167/61 169/82 180/70 157/62  Pulse: 63 70 72 64  Temp: 98.9 F (37.2 C) 98 F (36.7 C)  98.3 F (36.8 C)  TempSrc: Oral Oral  Oral  Resp: 20 20  20   Height:      Weight:  82.7 kg (182 lb 5.1 oz)    SpO2: 100% 100%  99%     General appearance: cooperative, no distress and slowed mentation Head: Normocephalic, without obvious abnormality, atraumatic Eyes: negative findings: lids and lashes normal, conjunctivae and sclerae normal and corneas clear Neck: no adenopathy, no carotid bruit, supple, symmetrical, trachea midline and thyroid not enlarged, symmetric, no tenderness/mass/nodules Resp: clear to auscultation bilaterally Cardio: no rub GI: soft, non-tender; bowel sounds normal; no  masses,  no organomegaly Extremities: edema 1+  Labs: Basic Metabolic Panel:  Recent Labs Lab 02/21/13 0400 02/22/13 0935 02/23/13 0600 02/24/13 0430 02/25/13 0445 02/26/13 0546 02/27/13 0631  NA 144 141 139 139 141 141 141  K 3.8 3.6 4.3 4.1 3.8 3.5 3.3*  CL 108 105 102 103 105 104 103  CO2 27 25 24 25 21 25 27   GLUCOSE 158* 102* 252* 110* 194* 95 125*  BUN 52* 51* 62* 68* 77* 81* 84*  CREATININE 2.47* 2.49* 2.62* 2.67* 2.93* 2.83*  2.85*  CALCIUM 7.9* 8.2* 8.2* 8.2* 8.1* 8.1* 8.0*   Liver Function Tests: No results found for this basename: AST, ALT, ALKPHOS, BILITOT, PROT, ALBUMIN,  in the last 168 hours No results found for this basename: LIPASE, AMYLASE,  in the last 168 hours No results found for this basename: AMMONIA,  in the last 168 hours CBC:  Recent Labs Lab 02/21/13 0400 02/25/13 1110  WBC 7.1 13.0*  HGB 7.7* 8.9*  HCT 23.2* 26.7*  MCV 87.9 88.1  PLT 232 298   PT/INR: @LABRCNTIP (inr:5) Cardiac Enzymes: )No results found for this basename: CKTOTAL, CKMB, CKMBINDEX, TROPONINI,  in the last 168 hours CBG:  Recent Labs Lab 02/26/13 1053 02/26/13 1555 02/26/13 2118 02/27/13 0553 02/27/13 1155  GLUCAP 92 81 207* 110* 104*    Iron Studies: No results found for this basename: IRON, TIBC, TRANSFERRIN, FERRITIN,  in the last 168 hours  Xrays/Other Studies: Dg Chest Port 1 View  02/26/2013   CLINICAL DATA:  Short of breath. History of congestive heart failure.  EXAM: PORTABLE CHEST - 1 VIEW  COMPARISON:  02/14/2013.  FINDINGS: Cardiac silhouette is mildly enlarged. The mediastinum is normal in contour.  The lungs are clear. Irregular interstitial thickening seen previously has resolved. No pleural effusion or pneumothorax.  The bony thorax is intact.  IMPRESSION: Presumed pulmonary edema seen previously has resolved. There is now no acute cardiopulmonary disease.   Electronically Signed   By: Amie Portland M.D.   On: 02/26/2013 08:10     Assessment/Plan: 1.  Proteinuria- likely related to longstanding, poorly controlled DM and would not change therapy even if she had nephrotic range proteinuria.  Would not add ACE/ARB given her multiple episodes of AKI/CKD related to right-sided HF and diuresis.  We will quantify and check other serologies. Cont with diuresis for now 2. AKI/CKD- has had multiple episodes and actually has been on HD in the past.  Discussed the ways to delay the progression of CKD  with  1. Tight BP control with goal BP <130/80 2. Tight glucose control with goal Hgb A1c <7% 3. Use of an ace/arb (would not in her case given frequent episodes of AKI/CKD) 4. Avoidance of nephrotoxic agents such as NSAIDs/COX-II I's and IV contrast 3. CHF/pulm HTN- cont with diuretics and would strongly recommend resumption of CPAP 4. HTN- not optimal.  Would recommend verapamil or diltiazem rather than nifidipine for longer acting CCB and antiproteinuric effects.   5. ACDz- as above.  Will check SPEP/UPEP, serologies, and iron stores 6. 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 DM- poorly controlled.  Plan per primary svc 8. Hypoalbuminemia- again possibly related to diabetic nephropathy but also on DDx is fatty liver disease or cardiac cirrhosis.  Would recommend checking liver images as well as LFT's 9. Anasarca/ascites- as above 10. CAD s/p NSTEMI 11. Pulm HTN- recommend CPAP compliance   Billal Rollo A 02/27/2013, 3:38 PM

## 2013-02-27 NOTE — Progress Notes (Signed)
Occupational Therapy Treatment Patient Details Name: Madeline [name] MRN: [id number] DOB: 1955-11-03 Today's Date: 02/27/2013 Time: 4098-1191 OT Time Calculation (min): 12 min  OT Assessment / Plan / Recommendation  History of present illness 57 year old female patient with history of congestive heart failure coronary artery disease as well as chronic kidney disease. She also has diabetes and chronic anemia related to her kidney disease. She presented to the hospital with complaints of shortness of breath and was found systolic blood pressure greater than 200. Chest x-ray was consistent with possible pulmonary edema. Patient had rapid onset of shortness of breath according to her family and no constitutional symptoms that would lead one to believe she had an infectious process. He over the past several days patient has also had increasing swelling of her extremities. She has had several CHF exacerbations over the past year and has been hospitalized.   OT comments  Pt making good progress with functional goals and should continue with acute OT services to help increase level of function and safety to restore PLOF to return home safely. Pt is at Mod I level with ADL mobility safety, set up/sup - min guard with all ADLs seated and in standing  Follow Up Recommendations  Home health OT;Supervision - Intermittent    Barriers to Discharge   None    Equipment Recommendations  None recommended by OT    Recommendations for Other Services    Frequency Min 2X/week   Progress towards OT Goals Progress towards OT goals: Progressing toward goals  Plan Discharge plan remains appropriate    Precautions / Restrictions Precautions Precautions: Fall Restrictions Weight Bearing Restrictions: No   Pertinent Vitals/Pain No c/o    ADL  Grooming: Performed;Wash/dry hands;Wash/dry face;Supervision/safety Where Assessed - Grooming: Unsupported standing Upper Body Bathing: Simulated;Set up Where Assessed -  Upper Body Bathing: Unsupported sitting;Unsupported standing Lower Body Bathing: Simulated;Supervision/safety;Set up;Min guard Where Assessed - Lower Body Bathing: Unsupported sitting;Supported sit to stand Upper Body Dressing: Performed;Set up;Supervision/safety Where Assessed - Upper Body Dressing: Supported standing Toilet Transfer: Performed;Modified independent Toilet Transfer Method: Sit to Barista: Regular height toilet;Grab bars Toileting - Clothing Manipulation and Hygiene: Supervision/safety Where Assessed - Engineer, mining and Hygiene: Standing Tub/Shower Transfer Method: Not assessed    OT Diagnosis:    OT Problem List:   OT Treatment Interventions:     OT Goals(current goals can now be found in the care plan section)    Visit Information  Last OT Received On: 02/27/13 Assistance Needed: +1 History of Present Illness: 57 year old female patient with history of congestive heart failure coronary artery disease as well as chronic kidney disease. She also has diabetes and chronic anemia related to her kidney disease. She presented to the hospital with complaints of shortness of breath and was found systolic blood pressure greater than 200. Chest x-ray was consistent with possible pulmonary edema. Patient had rapid onset of shortness of breath according to her family and no constitutional symptoms that would lead one to believe she had an infectious process. He over the past several days patient has also had increasing swelling of her extremities. She has had several CHF exacerbations over the past year and has been hospitalized.    Subjective Data      Prior Functioning       Cognition  Cognition Arousal/Alertness: Awake/alert Behavior During Therapy: WFL for tasks assessed/performed Overall Cognitive Status: Within Functional Limits for tasks assessed    Mobility  Bed Mobility Bed Mobility: Supine to Sit;Sitting -  Scoot to [place] of  Bed;Sit to Supine Supine to Sit: 6: Modified independent (Device/Increase time);HOB flat Sitting - Scoot to Edge of Bed: 6: Modified independent (Device/Increase time) Sit to Supine: 6: Modified independent (Device/Increase time);HOB flat Transfers Transfers: Sit to Stand;Stand to Sit Sit to Stand: 6: Modified independent (Device/Increase time);From bed;From chair/3-in-1;From toilet;With upper extremity assist Stand to Sit: 6: Modified independent (Device/Increase time);To chair/3-in-1;To bed;To toilet;Without upper extremity assist Details for Transfer Assistance: pt able to complete with and without use of hands safely    Exercises      Balance Balance Balance Assessed: Yes Dynamic Standing Balance Dynamic Standing - Balance Support: No upper extremity supported;During functional activity Dynamic Standing - Level of Assistance: 5: Stand by assistance   End of Session OT - End of Session Activity Tolerance: Patient tolerated treatment well Patient left: in chair;with call bell/phone within reach  GO     Galen Manila 02/27/2013, 2:50 PM

## 2013-02-27 NOTE — Progress Notes (Signed)
Physical Therapy Treatment Patient Details Name: Madeline [name] MRN: [id number] DOB: Oct 06, 1955 Today's Date: 02/27/2013 Time: 7829-5621 PT Time Calculation (min): 39 min  PT Assessment / Plan / Recommendation  History of Present Illness 57 year old female patient with history of congestive heart failure coronary artery disease as well as chronic kidney disease. She also has diabetes and chronic anemia related to her kidney disease. She presented to the hospital with complaints of shortness of breath and was found systolic blood pressure greater than 200. Chest x-ray was consistent with possible pulmonary edema. Patient had rapid onset of shortness of breath according to her family and no constitutional symptoms that would lead one to believe she had an infectious process. He over the past several days patient has also had increasing swelling of her extremities. She has had several CHF exacerbations over the past year and has been hospitalized.   PT Comments   Pt initially distraced and distraught by weekend interaction with nursing, however after listening she was able to focus on session. Pt with improved gait and function and will continue to work on balance and gait without use of RW. Pt encouraged to ambulate with staff and continue HEP throughout the day.  Follow Up Recommendations  Home health PT     Does the patient have the potential to tolerate intense rehabilitation     Barriers to Discharge        Equipment Recommendations       Recommendations for Other Services    Frequency     Progress towards PT Goals Progress towards PT goals: Progressing toward goals  Plan Current plan remains appropriate    Precautions / Restrictions Precautions Precautions: Fall Restrictions Weight Bearing Restrictions: No   Pertinent Vitals/Pain No pain HR 71 sats 97% on RA   Mobility  Bed Mobility Supine to Sit: 6: Modified independent (Device/Increase time);HOB flat Sitting - Scoot to Edge  of Bed: 6: Modified independent (Device/Increase time) Transfers Transfers: Stand Pivot Transfers Sit to Stand: 6: Modified independent (Device/Increase time);From bed;From chair/3-in-1 Stand to Sit: 6: Modified independent (Device/Increase time);To chair/3-in-1;To bed Stand Pivot Transfers: 6: Modified independent (Device/Increase time) Details for Transfer Assistance: pt able to complete with and without use of hands safely Ambulation/Gait Ambulation/Gait Assistance: 5: Supervision Ambulation Distance (Feet): 400 Feet Assistive device: None Ambulation/Gait Assistance Details: pt with steady gait with decreased stride but no LOB, slight pause in gait with trial of head turns but again no LOB Gait Pattern: Step-through pattern;Decreased stride length Gait velocity: decreased Stairs: No    Exercises General Exercises - Lower Extremity Ankle Circles/Pumps: AROM;Both;20 reps;Seated Long Arc Quad: AROM;Both;20 reps;Seated Hip ABduction/ADduction: AROM;Seated;Both;20 reps Hip Flexion/Marching: AROM;Both;20 reps;Seated   PT Diagnosis:    PT Problem List:   PT Treatment Interventions:     PT Goals (current goals can now be found in the care plan section)    Visit Information  Last PT Received On: 02/27/13 Assistance Needed: +1 History of Present Illness: 57 year old female patient with history of congestive heart failure coronary artery disease as well as chronic kidney disease. She also has diabetes and chronic anemia related to her kidney disease. She presented to the hospital with complaints of shortness of breath and was found systolic blood pressure greater than 200. Chest x-ray was consistent with possible pulmonary edema. Patient had rapid onset of shortness of breath according to her family and no constitutional symptoms that would lead one to believe she had an infectious process. He over the past several days patient has  also had increasing swelling of her extremities. She has had  several CHF exacerbations over the past year and has been hospitalized.    Subjective Data      Cognition  Cognition Arousal/Alertness: Awake/alert Behavior During Therapy: WFL for tasks assessed/performed Overall Cognitive Status: Within Functional Limits for tasks assessed    Balance  Standardized Balance Assessment Standardized Balance Assessment: Berg Balance Test Berg Balance Test Sit to Stand: Able to stand without using hands and stabilize independently Standing Unsupported: Able to stand safely 2 minutes Sitting with Back Unsupported but Feet Supported on Floor or Stool: Able to sit safely and securely 2 minutes Stand to Sit: Sits safely with minimal use of hands Transfers: Able to transfer safely, minor use of hands Standing Unsupported with Eyes Closed: Able to stand 10 seconds with supervision Standing Ubsupported with Feet Together: Able to place feet together independently and stand for 1 minute with supervision From Standing, Reach Forward with Outstretched Arm: Can reach forward >12 cm safely (5") From Standing Position, Pick up Object from Floor: Able to pick up shoe safely and easily From Standing Position, Turn to Look Behind Over each Shoulder: Looks behind from both sides and weight shifts well Turn 360 Degrees: Able to turn 360 degrees safely but slowly Standing Unsupported, Alternately Place Feet on Step/Stool: Able to complete >2 steps/needs minimal assist Standing Unsupported, One Foot in Front: Needs help to step but can hold 15 seconds Standing on One Leg: Tries to lift leg/unable to hold 3 seconds but remains standing independently Total Score: 42  End of Session PT - End of Session Equipment Utilized During Treatment: Gait belt Activity Tolerance: Patient tolerated treatment well Patient left: in chair;with call bell/phone within reach Nurse Communication: Mobility status   GP     Delorse Lek 02/27/2013, 10:38 AM Delaney Meigs,  PT [phone]

## 2013-02-28 ENCOUNTER — Encounter (HOSPITAL_COMMUNITY)
Admission: EM | Disposition: A | Discharge status: Home Health | Payer: Self-pay | Source: Home / Self Care | Attending: Internal Medicine

## 2013-02-28 ENCOUNTER — Inpatient Hospital Stay (HOSPITAL_COMMUNITY): Payer: Medicaid Other

## 2013-02-28 ENCOUNTER — Encounter (HOSPITAL_COMMUNITY): Payer: Self-pay | Admitting: Physician Assistant

## 2013-02-28 DIAGNOSIS — I279 Pulmonary heart disease, unspecified: Secondary | ICD-10-CM

## 2013-02-28 DIAGNOSIS — N184 Chronic kidney disease, stage 4 (severe): Secondary | ICD-10-CM

## 2013-02-28 DIAGNOSIS — I10 Essential (primary) hypertension: Secondary | ICD-10-CM

## 2013-02-28 HISTORY — PX: RIGHT HEART CATHETERIZATION: SHX5447

## 2013-02-28 LAB — CBC
Hemoglobin: 7.8 g/dL — ABNORMAL LOW (ref 12.0–15.0)
MCH: 29.8 pg (ref 26.0–34.0)
MCV: 88.8 fL (ref 78.0–100.0)
MCV: 88.9 fL (ref 78.0–100.0)
Platelets: 246 10*3/uL (ref 150–400)
Platelets: 221 10*3/uL (ref 150–400)
RBC: 2.69 MIL/uL — ABNORMAL LOW (ref 3.87–5.11)
RBC: 2.62 MIL/uL — ABNORMAL LOW (ref 3.87–5.11)
RDW: 15.5 % (ref 11.5–15.5)
WBC: 8.8 10*3/uL (ref 4.0–10.5)
WBC: 8.4 10*3/uL (ref 4.0–10.5)

## 2013-02-28 LAB — POCT I-STAT 3, VENOUS BLOOD GAS (G3P V)
Bicarbonate: 28.1 mEq/L — ABNORMAL HIGH (ref 20.0–24.0)
TCO2: 30 mmol/L (ref 0–100)
pH, Ven: 7.357 — ABNORMAL HIGH (ref 7.250–7.300)
pO2, Ven: 31 mmHg (ref 30.0–45.0)

## 2013-02-28 LAB — URINALYSIS, ROUTINE W REFLEX MICROSCOPIC
Bilirubin Urine: NEGATIVE
Glucose, UA: 100 mg/dL — AB
Ketones, ur: NEGATIVE mg/dL
Leukocytes, UA: NEGATIVE
Nitrite: NEGATIVE
Protein, ur: >300 mg/dL — AB
Urobilinogen, UA: 0.2 mg/dL (ref 0.0–1.0)
pH: 6 (ref 5.0–8.0)

## 2013-02-28 LAB — COMPREHENSIVE METABOLIC PANEL
ALT: 43 U/L — ABNORMAL HIGH (ref 0–35)
AST: 59 U/L — ABNORMAL HIGH (ref 0–37)
Albumin: 1.7 g/dL — ABNORMAL LOW (ref 3.5–5.2)
Alkaline Phosphatase: 108 U/L (ref 39–117)
Calcium: 7.9 mg/dL — ABNORMAL LOW (ref 8.4–10.5)
Chloride: 103 mEq/L (ref 96–112)
GFR calc Af Amer: 19 mL/min — ABNORMAL LOW (ref >90)
Glucose, Bld: 91 mg/dL (ref 70–99)
Potassium: 3.8 mEq/L (ref 3.5–5.1)
Sodium: 142 mEq/L (ref 135–145)
Total Bilirubin: 0.1 mg/dL — ABNORMAL LOW (ref 0.3–1.2)
Total Protein: 5.4 g/dL — ABNORMAL LOW (ref 6.0–8.3)

## 2013-02-28 LAB — GLUCOSE, CAPILLARY
Glucose-Capillary: 85 mg/dL (ref 70–99)
Glucose-Capillary: 118 mg/dL — ABNORMAL HIGH (ref 70–99)

## 2013-02-28 LAB — MAGNESIUM: Magnesium: 1.8 mg/dL (ref 1.5–2.5)

## 2013-02-28 LAB — KAPPA/LAMBDA LIGHT CHAINS: Kappa free light chain: 15.9 mg/dL — ABNORMAL HIGH (ref 0.33–1.94)

## 2013-02-28 LAB — TSH: TSH: 3.023 u[IU]/mL (ref 0.350–4.500)

## 2013-02-28 LAB — CREATININE, SERUM
Creatinine, Ser: 3.02 mg/dL — ABNORMAL HIGH (ref 0.50–1.10)
GFR calc Af Amer: 19 mL/min — ABNORMAL LOW (ref >90)
GFR calc non Af Amer: 16 mL/min — ABNORMAL LOW (ref >90)

## 2013-02-28 LAB — IRON AND TIBC
Iron: 56 ug/dL (ref 42–135)
Saturation Ratios: 30 % (ref 20–55)
TIBC: 187 ug/dL — ABNORMAL LOW (ref 250–470)

## 2013-02-28 LAB — URINE MICROSCOPIC-ADD ON

## 2013-02-28 LAB — SODIUM, URINE, RANDOM: Sodium, Ur: 117 mEq/L

## 2013-02-28 LAB — PHOSPHORUS: Phosphorus: 4.5 mg/dL (ref 2.3–4.6)

## 2013-02-28 SURGERY — RIGHT HEART CATH
Anesthesia: Moderate Sedation

## 2013-02-28 MED ORDER — ONDANSETRON HCL 4 MG/2ML IJ SOLN: 4.0000 mg | Freq: Four times a day (QID) | INTRAMUSCULAR | Status: DC | PRN

## 2013-02-28 MED ORDER — HEPARIN SODIUM (PORCINE) 1000 UNIT/ML IJ SOLN
INTRAMUSCULAR | Status: AC
  Filled 2013-02-28: qty 1

## 2013-02-28 MED ORDER — LIDOCAINE-PRILOCAINE 2.5-2.5 % EX CREA
1.0000 "application " | TOPICAL_CREAM | CUTANEOUS | Status: DC | PRN
  Filled 2013-02-28: qty 5

## 2013-02-28 MED ORDER — SODIUM CHLORIDE 0.9 % IJ SOLN: 3.0000 mL | INTRAMUSCULAR | Status: DC | PRN

## 2013-02-28 MED ORDER — HEPARIN SODIUM (PORCINE) 5000 UNIT/ML IJ SOLN
5000.0000 [IU] | Freq: Three times a day (TID) | INTRAMUSCULAR | Status: DC
  Administered 2013-02-28 – 2013-03-05 (×13): 5000 [IU] via SUBCUTANEOUS
  Filled 2013-02-28 (×19): qty 1

## 2013-02-28 MED ORDER — HEPARIN SODIUM (PORCINE) 1000 UNIT/ML DIALYSIS: 20.0000 [IU]/kg | INTRAMUSCULAR | Status: DC | PRN

## 2013-02-28 MED ORDER — HEPARIN (PORCINE) IN NACL 2-0.9 UNIT/ML-% IJ SOLN
INTRAMUSCULAR | Status: AC
  Filled 2013-02-28: qty 500

## 2013-02-28 MED ORDER — SODIUM CHLORIDE 0.9 % IV SOLN: 250.0000 mL | INTRAVENOUS | Status: DC | PRN

## 2013-02-28 MED ORDER — LIDOCAINE HCL (PF) 1 % IJ SOLN: 5.0000 mL | INTRAMUSCULAR | Status: DC | PRN

## 2013-02-28 MED ORDER — NEPRO/CARBSTEADY PO LIQD
237.0000 mL | ORAL | Status: DC | PRN
  Filled 2013-02-28: qty 237

## 2013-02-28 MED ORDER — ALTEPLASE 2 MG IJ SOLR
2.0000 mg | Freq: Once | INTRAMUSCULAR | Status: DC | PRN
  Filled 2013-02-28: qty 2

## 2013-02-28 MED ORDER — SODIUM CHLORIDE 0.9 % IJ SOLN
3.0000 mL | Freq: Two times a day (BID) | INTRAMUSCULAR | Status: DC
  Administered 2013-03-02 – 2013-03-05 (×5): 3 mL via INTRAVENOUS

## 2013-02-28 MED ORDER — VANCOMYCIN HCL IN DEXTROSE 1-5 GM/200ML-% IV SOLN
1000.0000 mg | Freq: Once | INTRAVENOUS | Status: AC
  Administered 2013-02-28: 1000 mg via INTRAVENOUS
  Filled 2013-02-28: qty 200

## 2013-02-28 MED ORDER — PENTAFLUOROPROP-TETRAFLUOROETH EX AERO: 1.0000 "application " | INHALATION_SPRAY | CUTANEOUS | Status: DC | PRN

## 2013-02-28 MED ORDER — LIDOCAINE HCL (PF) 1 % IJ SOLN
INTRAMUSCULAR | Status: AC
  Filled 2013-02-28: qty 30

## 2013-02-28 MED ORDER — HEPARIN SODIUM (PORCINE) 1000 UNIT/ML DIALYSIS: 1000.0000 [IU] | INTRAMUSCULAR | Status: DC | PRN

## 2013-02-28 MED ORDER — SODIUM CHLORIDE 0.9 % IV SOLN: 100.0000 mL | INTRAVENOUS | Status: DC | PRN

## 2013-02-28 MED ORDER — SODIUM CHLORIDE 0.9 % IJ SOLN: 3.0000 mL | Freq: Two times a day (BID) | INTRAMUSCULAR | Status: DC

## 2013-02-28 MED ORDER — ACETAMINOPHEN 325 MG PO TABS: 650.0000 mg | ORAL_TABLET | ORAL | Status: DC | PRN

## 2013-02-28 MED ORDER — SODIUM CHLORIDE 0.9 % IV SOLN
250.0000 mL | INTRAVENOUS | Status: DC | PRN
  Administered 2013-03-03: 500 mL via INTRAVENOUS

## 2013-02-28 NOTE — Progress Notes (Signed)
Advanced Heart Failure Rounding Note   Subjective:    Madeline [name] is a 57 y/o F with history of HFpEF (RHF/anasarca with pulmonary HTN), CKD stage 3, suspected CAD, DM, chronic anemia, OSA (not recently using CPAP) who is originally from [place] who moved to [place] in May 2014. Her daughter is a director of nursing at [employer]. She presented to [facility] 02/14/2013 with acute respiratory failure requiring Bipap and acute on chronic combined CHF. I reviewed extensive records from [place] - it looks like she presented there in 01/2012 with NSTEMI, pulm HTN and SOB. She underwent CTA which was negative for PE but resulted in ARF requiring temporary dialysis (1 session). Invasive ischemic evaluation was not pursued. She reports remote heart cath in 1980s but none recently including RHC. She does have h/o prior normal Lexiscan nuc in 12/2011 (before that hospitalization). She has had several admissions to the hospital for CHF, CP rule-out, and one in 09/2012 for metabolic encephalopathy with question of aseptic meningitis, CVA (with reportedly negative MRI), oliguric ATN/ARF, and CHF. Her renal function is historically referred to as CKD stage III with frequent worsening of insufficiency with diuresis. Her Cr in 2013 was 1.3 but more recently has run in the 2.5-2.8 range with levels up to 4 with diuresis.   Yesterday she started on lasix drip. Weight down 3 pounds. Overall she is down 23 pounds. 24 hour I/O -1.6 liters . Denies dyspnea. Creatinine up to 3.0  Objective:   Weight Range:  Vital Signs:   Temp:  [98.3 F (36.8 C)-98.4 F (36.9 C)] 98.3 F (36.8 C) (10/21 0618) Pulse Rate:  [63-72] 63 (10/21 0618) Resp:  [18-20] 18 (10/21 0618) BP: (157-189)/(62-74) 189/74 mmHg (10/21 0618) SpO2:  [99 %-100 %] 100 % (10/21 0618) FiO2 (%):  [21 %] 21 % (10/21 0000) Weight:  [179 lb 0.2 oz (81.2 kg)] 179 lb 0.2 oz (81.2 kg) (10/21 0618) Last BM Date: 02/26/13  Weight change: Filed Weights   02/26/13 0700 02/27/13 0500 02/28/13 0618  Weight: 186 lb 9.6 oz (84.641 kg) 182 lb 5.1 oz (82.7 kg) 179 lb 0.2 oz (81.2 kg)    Intake/Output:   Intake/Output Summary (Last 24 hours) at 02/28/13 0745 Last data filed at 02/28/13 0600  Gross per 24 hour  Intake    840 ml  Output   2450 ml  Net  -1610 ml     Physical Exam: General:  Well appearing. No resp difficulty Sitting in chair HEENT: normal Neck: supple. JVP ~10 . Carotids 2+ bilat; no bruits. No lymphadenopathy or thryomegaly appreciated. Cor: PMI nondisplaced. Regular rate & rhythm. No rubs, gallops or murmurs. Lungs: clear Abdomen: soft, nontender, nondistended. No hepatosplenomegaly. No bruits or masses. Good bowel sounds. Extremities: no cyanosis, clubbing, rash, R and LLE 3+ edema Neuro: alert & orientedx3, cranial nerves grossly intact. moves all 4 extremities w/o difficulty. Affect pleasant  Telemetry: SR 60s  Labs: Basic Metabolic Panel:  Recent Labs Lab 02/24/13 0430 02/25/13 0445 02/26/13 0546 02/27/13 0631 02/28/13 0530  NA 139 141 141 141 142  K 4.1 3.8 3.5 3.3* 3.8  CL 103 105 104 103 103  CO2 25 21 25 27 29  GLUCOSE 110* 194* 95 125* 91  BUN 68* 77* 81* 84* 89*  CREATININE 2.67* 2.93* 2.83* 2.85* 2.99*  CALCIUM 8.2* 8.1* 8.1* 8.0* 7.9*  MG  --   --   --   --  1.8  PHOS  --   --   --   --    4.5    Liver Function Tests:  Recent Labs Lab 02/28/13 0530  AST 59*  ALT 43*  ALKPHOS 108  BILITOT 0.1*  PROT 5.4*  ALBUMIN 1.7*   No results found for this basename: LIPASE, AMYLASE,  in the last 168 hours No results found for this basename: AMMONIA,  in the last 168 hours  CBC:  Recent Labs Lab 02/25/13 1110 02/28/13 0530  WBC 13.0* 8.8  HGB 8.9* 7.8*  HCT 26.7* 23.9*  MCV 88.1 88.8  PLT 298 246    Cardiac Enzymes: No results found for this basename: CKTOTAL, CKMB, CKMBINDEX, TROPONINI,  in the last 168 hours  BNP: BNP (last 3 results)  Recent Labs  02/14/13 2333 02/22/13 0709  02/26/13 0546  PROBNP 8501.0* 5090.0* 7568.0*     Other results:    Imaging: Dg Chest Port 1 View  02/26/2013   CLINICAL DATA:  Short of breath. History of congestive heart failure.  EXAM: PORTABLE CHEST - 1 VIEW  COMPARISON:  02/14/2013.  FINDINGS: Cardiac silhouette is mildly enlarged. The mediastinum is normal in contour.  The lungs are clear. Irregular interstitial thickening seen previously has resolved. No pleural effusion or pneumothorax.  The bony thorax is intact.  IMPRESSION: Presumed pulmonary edema seen previously has resolved. There is now no acute cardiopulmonary disease.   Electronically Signed   By: David  Ormond M.D.   On: 02/26/2013 08:10      Medications:     Scheduled Medications: . aspirin EC  81 mg Oral Daily  . carvedilol  40 mg Oral Daily  . ferrous sulfate  325 mg Oral TID WC  . gabapentin  100 mg Oral BID  . hydrALAZINE  100 mg Oral TID  . insulin aspart  0-5 Units Subcutaneous QHS  . insulin aspart  0-9 Units Subcutaneous TID WC  . insulin aspart  8 Units Subcutaneous TID WC  . insulin glargine  18 Units Subcutaneous QHS  . isosorbide mononitrate  120 mg Oral Daily  . metolazone  5 mg Oral BID  . NIFEdipine  60 mg Oral Daily  . pantoprazole  40 mg Oral BID  . polyethylene glycol  17 g Oral Daily  . predniSONE  10 mg Oral Q breakfast  . sodium chloride  3 mL Intravenous Q12H     Infusions: . furosemide (LASIX) infusion 10 mg/hr (02/27/13 2027)     PRN Medications:  acetaminophen, acetaminophen, albuterol, labetalol, ondansetron (ZOFRAN) IV, ondansetron, traMADol   Assessment:   1. Acute on chronic diastolic CHF with right sided HF -2D echo 02/15/13 demonstrated preserved EF 65%, +diastolic dysfunction, PA pressure 66mmHg 2. Pulmonary hypertension  3. Acute on chronic kidney disease  4. Persistent hypertension  5. Acute on chronic anemia with iron deficiency s/p PRBC/IV iron  6. Severe hypoalbuminemia with proteinuria  7. Gastric  ulcer disease  8. OSA, noncompliant with CPAP prior to admission  9. COPD  10. Diabetes mellitus      Plan/Discussion:    Volume status improving. Weight down another 3 pounds. Continue lasix drip at 10 mg per hour.  Creatinine up slightly.  Will need RHC at some point to further evaluate.    Length of Stay: 14   CLEGG,AMY 02/28/2013, 7:45 AM  Advanced Heart Failure Team Pager [phone] (M-F; 7a - 4p)  Please contact [facility] for night-coverage after hours (4p -7a ) and weekends on [email or web address]  Patient seen and examined with Amy Clegg, NP. We discussed all aspects of the encounter. I   agree with the assessment and plan as stated above. Weight down a little bit but still with massive volume overload. I think her renal function is the major issue here and suspect she is heading toward HD. I spoke with Dr. Coladonato who agrees. Will plan RHC to see how bad RHF is. If cardiac output low will try to optimize volume status with inotropes and diuretics. If output OK will place Trialysis catheter. Will likely need to go to stepdown post cath.  I d/w her and her daughter.  Daniel Bensimhon,MD 8:58 AM   

## 2013-02-28 NOTE — Progress Notes (Signed)
Pt maintained npo as ordered. Denies any abd pain nor SOB. 24 hr urine collection started at 6am. Tele  NSR. Denies any CP.

## 2013-02-28 NOTE — Interval H&P Note (Signed)
History and Physical Interval Note:  02/28/2013 1:33 PM  Madeline [name]  has presented today for surgery, with the diagnosis of heart failure  The various methods of treatment have been discussed with the patient and family. After consideration of risks, benefits and other options for treatment, the patient has consented to  Procedure(s): RIGHT HEART CATH (N/A) as a surgical intervention .  The patient's history has been reviewed, patient examined, no change in status, stable for surgery.  I have reviewed the patient's chart and labs.  Questions were answered to the patient's satisfaction.     Daniel Bensimhon

## 2013-02-28 NOTE — Progress Notes (Signed)
PT Cancellation Note  Patient Details Name: Madeline [name] MRN: [id number] DOB: July 05, 1955   Cancelled Treatment:    Reason Eval/Treat Not Completed: Medical issues which prohibited therapy.  Pt had cath and was on bedrest.  Will return tomorrow as able.  Thanks.   INGOLD,Clovis Mankins 02/28/2013, 4:06 PM  [facility] [phone] (pager)

## 2013-02-28 NOTE — Progress Notes (Signed)
Pt. Has already placed herself on CPAP auto titrate Min: 6, Max: 20 via FFM. Pt. Is tolerating CPAP well at this time without any complications.

## 2013-02-28 NOTE — Progress Notes (Signed)
RT checked on PT again at this time about wearing CPAP machine however PT has refused the machine for tonight due to neck being irritated. RT will continue to assist as needed.

## 2013-02-28 NOTE — CV Procedure (Addendum)
Cardiac Cath Procedure Note:  Indication:   Procedures performed:  1) Right heart catheterization 2) Insertion of R IJ Trialysis catheter  Description of procedure:   The risks and indication of the procedure were explained. Consent was signed and placed on the chart. An appropriate timeout was taken prior to the procedure. The right neck was prepped and draped in the routine sterile fashion and anesthetized with 1% local lidocaine.   A 7 FR venous sheath was placed in the right internal jugular vein using a modified Seldinger technique. A standard Swan-Ganz catheter was used for the procedure.   Once the RHC was completed we removed the Swan. The sheath was then changed over a wire for the Trialysis catheter which was placed under flouroscopic guidance. At 15cm the tip of the Trialysis catheter was in the RA so it was pulled back to 13 cm and sutured in place.  Complications: None apparent.  Findings:  RA = 14 RV = 75/8/18 PA = 69/16 (43) PCW = 29 with v-waves to 50 (check on in R and L PAs) Fick cardiac output/index = 5.4/3.1 PVR = 2.8 FA sat = 97% PA sat = 56%, 57%  Assessment:  1. Marked volume overload with normal cardiac output 2. Moderate pulmonary HTN with normal PVR suggestive of predominantly pulmonary venous HTN  Plan/Discussion:  She is markedly volume overloaded with normal cardiac output. Suspect major issue now is her renal function. Trialysis catheter placed and discussed with Renal team who will dialyze her later today.  Reinhard Schack 2:20 PM

## 2013-02-28 NOTE — Progress Notes (Signed)
Patient ID: Madeline [name], female   DOB: 1955-11-11, 57 y.o.   MRN: [id number] S:pt still feels a little SOB O:BP 202/81  Pulse 69  Temp(Src) 98 F (36.7 C) (Oral)  Resp 20  Ht 4\' 9"  (1.448 m)  Wt 81.2 kg (179 lb 0.2 oz)  BMI 38.73 kg/m2  SpO2 100%  Intake/Output Summary (Last 24 hours) at 02/28/13 1548 Last data filed at 02/28/13 1505  Gross per 24 hour  Intake    480 ml  Output   2550 ml  Net  -2070 ml   Intake/Output: I/O last 3 completed shifts: In: 840 [P.O.:720; I.V.:120] Out: 4950 [Urine:4950]  Intake/Output this shift:  Total I/O In: -  Out: 700 [Urine:700] Weight change: -1.5 kg (-3 lb 4.9 oz) Gen:WD AAF in NAD CVS:no rub Resp:decreased BS bilaterally WUJ:WJXBJ Ext:+anasarca   Recent Labs Lab 02/22/13 0935 02/23/13 0600 02/24/13 0430 02/25/13 0445 02/26/13 0546 02/27/13 0631 02/28/13 0530  NA 141 139 139 141 141 141 142  K 3.6 4.3 4.1 3.8 3.5 3.3* 3.8  CL 105 102 103 105 104 103 103  CO2 25 24 25 21 25 27 29   GLUCOSE 102* 252* 110* 194* 95 125* 91  BUN 51* 62* 68* 77* 81* 84* 89*  CREATININE 2.49* 2.62* 2.67* 2.93* 2.83* 2.85* 2.99*  ALBUMIN  --   --   --   --   --   --  1.7*  CALCIUM 8.2* 8.2* 8.2* 8.1* 8.1* 8.0* 7.9*  PHOS  --   --   --   --   --   --  4.5  AST  --   --   --   --   --   --  59*  ALT  --   --   --   --   --   --  43*   Liver Function Tests:  Recent Labs Lab 02/28/13 0530  AST 59*  ALT 43*  ALKPHOS 108  BILITOT 0.1*  PROT 5.4*  ALBUMIN 1.7*   No results found for this basename: LIPASE, AMYLASE,  in the last 168 hours No results found for this basename: AMMONIA,  in the last 168 hours CBC:  Recent Labs Lab 02/25/13 1110 02/28/13 0530  WBC 13.0* 8.8  HGB 8.9* 7.8*  HCT 26.7* 23.9*  MCV 88.1 88.8  PLT 298 246   Cardiac Enzymes: No results found for this basename: CKTOTAL, CKMB, CKMBINDEX, TROPONINI,  in the last 168 hours CBG:  Recent Labs Lab 02/27/13 1155 02/27/13 1649 02/27/13 2104 02/28/13 0603  02/28/13 1250  GLUCAP 104* 168* 132* 85 122*    Iron Studies:  Recent Labs  02/28/13 0530  IRON 56  TIBC 187*   Studies/Results: US Abdomen Complete  02/28/2013   CLINICAL DATA:  Acute kidney injury. Hypoalbuminemia and anasarca with ascites.  EXAM: ULTRASOUND ABDOMEN COMPLETE  COMPARISON:  02/15/2013  FINDINGS: Gallbladder  No gallstones or wall thickening visualized. No sonographic Murphy sign noted.  Common bile duct  Diameter: 8.6 mm. No duct stone is seen.  Liver  No focal lesion identified. Within normal limits in parenchymal echogenicity.  IVC  No abnormality visualized.  Pancreas  Visualized portion unremarkable.  Spleen  Spleen is borderline enlarged with a volume of 383 mL. No splenic mass or focal lesion.  Right Kidney  Length: 9.6 cm. Increased renal parenchymal echogenicity. No mass or hydronephrosis.  Left Kidney  Length: 11.7 cm. Increased renal parenchymal echogenicity. No mass or hydronephrosis.  Abdominal aorta  No aneurysm visualized.  Small amount of ascites is seen adjacent to the liver and spleen and in the perinephric spaces.  IMPRESSION: 1. Echogenic kidneys consistent with medical renal disease. No hydronephrosis. 2. Borderline splenomegaly of unclear etiology. 3. Dilation of the common bile duct to 8.6 mm. No duct stone is seen. There is no pancreatic mass. This may be chronic. 4. Small amount of ascites. 5. No other abnormalities. No acute findings.   Electronically Signed   By: Amie Portland M.D.   On: 02/28/2013 12:12   . aspirin EC  81 mg Oral Daily  . carvedilol  40 mg Oral Daily  . ferrous sulfate  325 mg Oral TID WC  . gabapentin  100 mg Oral BID  . heparin  5,000 Units Subcutaneous Q8H  . hydrALAZINE  100 mg Oral TID  . insulin aspart  0-5 Units Subcutaneous QHS  . insulin aspart  0-9 Units Subcutaneous TID WC  . insulin aspart  8 Units Subcutaneous TID WC  . insulin glargine  18 Units Subcutaneous QHS  . isosorbide mononitrate  120 mg Oral Daily  .  metolazone  5 mg Oral BID  . NIFEdipine  60 mg Oral Daily  . pantoprazole  40 mg Oral BID  . polyethylene glycol  17 g Oral Daily  . predniSONE  10 mg Oral Q breakfast  . sodium chloride  3 mL Intravenous Q12H  . sodium chloride  3 mL Intravenous Q12H  . vancomycin  1,000 mg Intravenous Once    BMET    Component Value Date/Time   NA 142 02/28/2013 0530   K 3.8 02/28/2013 0530   CL 103 02/28/2013 0530   CO2 29 02/28/2013 0530   GLUCOSE 91 02/28/2013 0530   BUN 89* 02/28/2013 0530   CREATININE 2.99* 02/28/2013 0530   CALCIUM 7.9* 02/28/2013 0530   GFRNONAA 17* 02/28/2013 0530   GFRAA 19* 02/28/2013 0530   CBC    Component Value Date/Time   WBC 8.8 02/28/2013 0530   RBC 2.69* 02/28/2013 0530   RBC 2.79* 02/18/2013 1345   HGB 7.8* 02/28/2013 0530   HCT 23.9* 02/28/2013 0530   PLT 246 02/28/2013 0530   MCV 88.8 02/28/2013 0530   MCH 29.0 02/28/2013 0530   MCHC 32.6 02/28/2013 0530   RDW 15.5 02/28/2013 0530   LYMPHSABS 1.1 02/14/2013 1853   MONOABS 0.6 02/14/2013 1853   EOSABS 0.1 02/14/2013 1853   BASOSABS 0.0 02/14/2013 1853     Assessment/Plan:  1. CHF- significant volume overload with preserved CO.  No indication for dobutamine and agree with Dr. Jones Broom to proceed with more aggressive volume removal.  Discussed with pt and her daughter who are also in agreement to proceed with HD and UF.  Hopefully, the improved volume status will help her cardiorenal syndrome and will not require longterm RRT, although she has had progressive CKD over the last year and a half. 2. Proteinuria- likely related to longstanding, poorly controlled DM and would not change therapy even if she had nephrotic range proteinuria. Would not add ACE/ARB given her multiple episodes of AKI/CKD related to right-sided HF and diuresis. We will quantify and check other serologies. Cont with diuresis for now and plan for HD today and again tomorrow. 3. AKI/CKD- has had multiple episodes and actually has been  on HD in the past. Discussed the ways to delay the progression of CKD with  1. Tight BP control with goal BP <130/80 2. Tight glucose control with goal Hgb  A1c <7% 3. Use of an ace/arb (would not in her case given frequent episodes of AKI/CKD) 4. Avoidance of nephrotoxic agents such as NSAIDs/COX-II I's and IV contrast 4. OSA/pulm HTN- plan for HD with UF and would strongly recommend resumption of CPAP 5. HTN- not optimal. Would recommend verapamil or diltiazem rather than nifidipine for longer acting CCB and antiproteinuric effects.  6. ACDz- as above. 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 DM- poorly controlled. Plan per primary svc 9. Hypoalbuminemia- again possibly related to diabetic nephropathy but also on DDx is fatty liver disease or cardiac cirrhosis. Would recommend checking liver images as well as LFT's 10. Anasarca/ascites- as above 11. CAD s/p NSTEMI 12. Pulm HTN- recommend CPAP compliance 13. Vascular access- appreciate Dr. Jones Broom for placing temp dialysis catheter.  If her renal function/UOP worsen will likely require AVG and tunneled HD cath by VVS  Shenea Giacobbe A

## 2013-02-28 NOTE — Progress Notes (Signed)
[facility] PROGRESS NOTE Interim History: 57 year old female patient with history of congestive heart failure coronary artery disease as well as chronic kidney disease. She also has diabetes and chronic anemia related to her kidney disease. She presented to the hospital with complaints of shortness of breath and was found to have systolic blood pressure greater than 200. Chest x-ray was consistent with possible pulmonary edema.Due to the degree of respiratory failure she did require BiPAP in the emergency department. She was also temporarily started on IV nitroglycerin and IV Lasix was given with subsequent improvement in the patient's blood pressure and breathing therefore BiPAP was weaned off. Pt was doing quite well with diuresis but then started having SOB on Wenesday, her lasix was increase and started on zaroxolyn. Her SOB improved and edema along with JVD was resolving. Then again Sunday her JVD increase and edema worsen. I and O's not reliable. ? Compliance with fluid restriction Cardiology was consulted for RHC as was nephrology for ? proteniuria -for RHC  [facility] Weights   02/26/13 0700 02/27/13 0500 02/28/13 0618  Weight: 84.641 kg (186 lb 9.6 oz) 82.7 kg (182 lb 5.1 oz) 81.2 kg (179 lb 0.2 oz)        Intake/Output Summary (Last 24 hours) at 02/28/13 1221 Last data filed at 02/28/13 0700  Gross per 24 hour  Intake    600 ml  Output   2450 ml  Net  -1850 ml    Assessment/Plan: Acute respiratory failure with hypoxia due to, Flash pulmonary edema due to malingnant HTN, Acute biventricular diastolic CHF (congestive heart failure)-stage 3/HTN (hypertension), malignant : - Increase lasix, cont coreg. Cont Hydralazine and Imdur. Cont zaroxolyn. - I and O's not accurate, pt daughter bring her fluids. fluid restriction.  - Increase calcium channel blockers. Lasix gtt - tapered down steroids, SOB improved. -off O2 -asked cardiology to see as she is high risk of bouncing back if she  does not have proper follow up -moves here from [facility]- reviewed, records on chart -JAN 2014- patient weighed 170.2 -RHC to evaluate for RHF per cards: If cardiac output low will try to optimize volume status with inotropes and diuretics. If output OK will place Trialysis catheter. Will likely need to go to stepdown post cath.    Chronic kidney disease (CKD), stage IV (severe)  -  Cr at baseline? - Has documented CKD IV- per notes from [place]  Anemia, chronic disease/Acute blood loss anemia  -hgb nadir 5.5, -CBC  -EGD revealed small multiple focal antral ulcers. PPI BID.  DM (diabetes mellitus)  -decrease lantus - cont monitor CBG's.  Hypokalemia -replace  Code Status: full Family Communication: none  Disposition Plan: inpatient   Consultants:  cards  Procedures: ECHO: 10.8.2014: ejection fraction was 65%. Wall motion was normal; there were no regional wall motion abnormalities. Findings consistent with left ventricular diastolic dysfunction. Doppler parameters are consistent with high ventricular filling pressure  abd U/S 02/28/13:Echogenic kidneys consistent with medical renal disease. No  hydronephrosis.  2. Borderline splenomegaly of unclear etiology.  3. Dilation of the common bile duct to 8.6 mm. No duct stone is  seen. There is no pancreatic mass. This may be chronic.  4. Small amount of ascites.  5. No other abnormalities. No acute findings.    Antibiotics:  None   HPI/Subjective: Feeling better SOB improved No vomiting  Objective: Filed Vitals:   02/27/13 1830 02/27/13 2100 02/28/13 0000 02/28/13 0618  BP: 165/70 167/64  189/74  Pulse:  70 70  63  Temp:  98.4 F (36.9 C)  98.3 F (36.8 C)  TempSrc:  Oral  Oral  Resp:  20 18 18   Height:      Weight:    81.2 kg (179 lb 0.2 oz)  SpO2:  100% 100% 100%     Exam: General: Alert, awake, oriented x3, in no acute distress.  HEENT: No bruits, no goiter.worsening JVD Heart: Regular rate and  rhythm. + 3 edema with scaling of her skin Lungs: diminished, no wheezing Abdomen: Soft, nontender, nondistended, positive bowel sounds.     Data Reviewed: Basic Metabolic Panel:  Recent Labs Lab 02/24/13 0430 02/25/13 0445 02/26/13 0546 02/27/13 0631 02/28/13 0530  NA 139 141 141 141 142  K 4.1 3.8 3.5 3.3* 3.8  CL 103 105 104 103 103  CO2 25 21 25 27 29   GLUCOSE 110* 194* 95 125* 91  BUN 68* 77* 81* 84* 89*  CREATININE 2.67* 2.93* 2.83* 2.85* 2.99*  CALCIUM 8.2* 8.1* 8.1* 8.0* 7.9*  MG  --   --   --   --  1.8  PHOS  --   --   --   --  4.5   Liver Function Tests:  Recent Labs Lab 02/28/13 0530  AST 59*  ALT 43*  ALKPHOS 108  BILITOT 0.1*  PROT 5.4*  ALBUMIN 1.7*   No results found for this basename: LIPASE, AMYLASE,  in the last 168 hours No results found for this basename: AMMONIA,  in the last 168 hours CBC:  Recent Labs Lab 02/25/13 1110 02/28/13 0530  WBC 13.0* 8.8  HGB 8.9* 7.8*  HCT 26.7* 23.9*  MCV 88.1 88.8  PLT 298 246   Cardiac Enzymes: No results found for this basename: CKTOTAL, CKMB, CKMBINDEX, TROPONINI,  in the last 168 hours BNP (last 3 results)  Recent Labs  02/14/13 2333 02/22/13 0709 02/26/13 0546  PROBNP 8501.0* 5090.0* 7568.0*   CBG:  Recent Labs Lab 02/27/13 0553 02/27/13 1155 02/27/13 1649 02/27/13 2104 02/28/13 0603  GLUCAP 110* 104* 168* 132* 85    No results found for this or any previous visit (from the past 240 hour(s)).   Studies: US Abdomen Complete  02/28/2013   CLINICAL DATA:  Acute kidney injury. Hypoalbuminemia and anasarca with ascites.  EXAM: ULTRASOUND ABDOMEN COMPLETE  COMPARISON:  02/15/2013  FINDINGS: Gallbladder  No gallstones or wall thickening visualized. No sonographic Murphy sign noted.  Common bile duct  Diameter: 8.6 mm. No duct stone is seen.  Liver  No focal lesion identified. Within normal limits in parenchymal echogenicity.  IVC  No abnormality visualized.  Pancreas  Visualized portion  unremarkable.  Spleen  Spleen is borderline enlarged with a volume of 383 mL. No splenic mass or focal lesion.  Right Kidney  Length: 9.6 cm. Increased renal parenchymal echogenicity. No mass or hydronephrosis.  Left Kidney  Length: 11.7 cm. Increased renal parenchymal echogenicity. No mass or hydronephrosis.  Abdominal aorta  No aneurysm visualized.  Small amount of ascites is seen adjacent to the liver and spleen and in the perinephric spaces.  IMPRESSION: 1. Echogenic kidneys consistent with medical renal disease. No hydronephrosis. 2. Borderline splenomegaly of unclear etiology. 3. Dilation of the common bile duct to 8.6 mm. No duct stone is seen. There is no pancreatic mass. This may be chronic. 4. Small amount of ascites. 5. No other abnormalities. No acute findings.   Electronically Signed   By: Amie Portland M.D.   On: 02/28/2013 12:12  Scheduled Meds: . aspirin EC  81 mg Oral Daily  . carvedilol  40 mg Oral Daily  . ferrous sulfate  325 mg Oral TID WC  . gabapentin  100 mg Oral BID  . hydrALAZINE  100 mg Oral TID  . insulin aspart  0-5 Units Subcutaneous QHS  . insulin aspart  0-9 Units Subcutaneous TID WC  . insulin aspart  8 Units Subcutaneous TID WC  . insulin glargine  18 Units Subcutaneous QHS  . isosorbide mononitrate  120 mg Oral Daily  . metolazone  5 mg Oral BID  . NIFEdipine  60 mg Oral Daily  . pantoprazole  40 mg Oral BID  . polyethylene glycol  17 g Oral Daily  . predniSONE  10 mg Oral Q breakfast  . sodium chloride  3 mL Intravenous Q12H  . sodium chloride  3 mL Intravenous Q12H   Continuous Infusions: . furosemide (LASIX) infusion 10 mg/hr (02/27/13 2027)     Benjamine Mola, Dawnell Bryant  [facility] Pager [phone]. If 8PM-8AM, please contact night-coverage at [email or web address], password [email or web address] 02/28/2013, 12:21 PM  LOS: 14 days

## 2013-02-28 NOTE — H&P (View-Only) (Signed)
Advanced Heart Failure Rounding Note   Subjective:    Ms. Vahle is a 57 y/o F with history of HFpEF (RHF/anasarca with pulmonary HTN), CKD stage 3, suspected CAD, DM, chronic anemia, OSA (not recently using CPAP) who is originally from [place] who moved to [facility] in May 2014. Her daughter is a Film/video editor at [employer]. She presented to [facility] 02/14/2013 with acute respiratory failure requiring Bipap and acute on chronic combined CHF. I reviewed extensive records from [facility] - it looks like she presented there in 01/2012 with NSTEMI, pulm HTN and SOB. She underwent CTA which was negative for PE but resulted in ARF requiring temporary dialysis (1 session). Invasive ischemic evaluation was not pursued. She reports remote heart cath in 1980s but none recently including RHC. She does have h/o prior normal Lexiscan nuc in 12/2011 (before that hospitalization). She has had several admissions to the hospital for CHF, CP rule-out, and one in 09/2012 for metabolic encephalopathy with question of aseptic meningitis, CVA (with reportedly negative MRI), oliguric ATN/ARF, and CHF. Her renal function is historically referred to as CKD stage III with frequent worsening of insufficiency with diuresis. Her Cr in 2013 was 1.3 but more recently has run in the 2.5-2.8 range with levels up to 4 with diuresis.   Yesterday she started on lasix drip. Weight down 3 pounds. Overall she is down 23 pounds. 24 hour I/O -1.6 liters . Denies dyspnea. Creatinine up to 3.0  Objective:   Weight Range:  Vital Signs:   Temp:  [98.3 F (36.8 C)-98.4 F (36.9 C)] 98.3 F (36.8 C) (10/21 0618) Pulse Rate:  [63-72] 63 (10/21 0618) Resp:  [18-20] 18 (10/21 0618) BP: (157-189)/(62-74) 189/74 mmHg (10/21 0618) SpO2:  [99 %-100 %] 100 % (10/21 0618) FiO2 (%):  [21 %] 21 % (10/21 0000) Weight:  [179 lb 0.2 oz (81.2 kg)] 179 lb 0.2 oz (81.2 kg) (10/21 0618) Last BM Date: 02/26/13  Weight change: Filed Weights   02/26/13 0700 02/27/13 0500 02/28/13 0618  Weight: 186 lb 9.6 oz (84.641 kg) 182 lb 5.1 oz (82.7 kg) 179 lb 0.2 oz (81.2 kg)    Intake/Output:   Intake/Output Summary (Last 24 hours) at 02/28/13 0745 Last data filed at 02/28/13 0600  Gross per 24 hour  Intake    840 ml  Output   2450 ml  Net  -1610 ml     Physical Exam: General:  Well appearing. No resp difficulty Sitting in chair HEENT: normal Neck: supple. JVP ~10 . Carotids 2+ bilat; no bruits. No lymphadenopathy or thryomegaly appreciated. Cor: PMI nondisplaced. Regular rate & rhythm. No rubs, gallops or murmurs. Lungs: clear Abdomen: soft, nontender, nondistended. No hepatosplenomegaly. No bruits or masses. Good bowel sounds. Extremities: no cyanosis, clubbing, rash, R and LLE 3+ edema Neuro: alert & orientedx3, cranial nerves grossly intact. moves all 4 extremities w/o difficulty. Affect pleasant  Telemetry: SR 60s  Labs: Basic Metabolic Panel:  Recent Labs Lab 02/24/13 0430 02/25/13 0445 02/26/13 0546 02/27/13 0631 02/28/13 0530  NA 139 141 141 141 142  K 4.1 3.8 3.5 3.3* 3.8  CL 103 105 104 103 103  CO2 25 21 25 27 29   GLUCOSE 110* 194* 95 125* 91  BUN 68* 77* 81* 84* 89*  CREATININE 2.67* 2.93* 2.83* 2.85* 2.99*  CALCIUM 8.2* 8.1* 8.1* 8.0* 7.9*  MG  --   --   --   --  1.8  PHOS  --   --   --   --  4.5    Liver Function Tests:  Recent Labs Lab 02/28/13 0530  AST 59*  ALT 43*  ALKPHOS 108  BILITOT 0.1*  PROT 5.4*  ALBUMIN 1.7*   No results found for this basename: LIPASE, AMYLASE,  in the last 168 hours No results found for this basename: AMMONIA,  in the last 168 hours  CBC:  Recent Labs Lab 02/25/13 1110 02/28/13 0530  WBC 13.0* 8.8  HGB 8.9* 7.8*  HCT 26.7* 23.9*  MCV 88.1 88.8  PLT 298 246    Cardiac Enzymes: No results found for this basename: CKTOTAL, CKMB, CKMBINDEX, TROPONINI,  in the last 168 hours  BNP: BNP (last 3 results)  Recent Labs  02/14/13 2333 02/22/13 0709  02/26/13 0546  PROBNP 8501.0* 5090.0* 7568.0*     Other results:    Imaging: Dg Chest Port 1 View  02/26/2013   CLINICAL DATA:  Short of breath. History of congestive heart failure.  EXAM: PORTABLE CHEST - 1 VIEW  COMPARISON:  02/14/2013.  FINDINGS: Cardiac silhouette is mildly enlarged. The mediastinum is normal in contour.  The lungs are clear. Irregular interstitial thickening seen previously has resolved. No pleural effusion or pneumothorax.  The bony thorax is intact.  IMPRESSION: Presumed pulmonary edema seen previously has resolved. There is now no acute cardiopulmonary disease.   Electronically Signed   By: Amie Portland M.D.   On: 02/26/2013 08:10      Medications:     Scheduled Medications: . aspirin EC  81 mg Oral Daily  . carvedilol  40 mg Oral Daily  . ferrous sulfate  325 mg Oral TID WC  . gabapentin  100 mg Oral BID  . hydrALAZINE  100 mg Oral TID  . insulin aspart  0-5 Units Subcutaneous QHS  . insulin aspart  0-9 Units Subcutaneous TID WC  . insulin aspart  8 Units Subcutaneous TID WC  . insulin glargine  18 Units Subcutaneous QHS  . isosorbide mononitrate  120 mg Oral Daily  . metolazone  5 mg Oral BID  . NIFEdipine  60 mg Oral Daily  . pantoprazole  40 mg Oral BID  . polyethylene glycol  17 g Oral Daily  . predniSONE  10 mg Oral Q breakfast  . sodium chloride  3 mL Intravenous Q12H     Infusions: . furosemide (LASIX) infusion 10 mg/hr (02/27/13 2027)     PRN Medications:  acetaminophen, acetaminophen, albuterol, labetalol, ondansetron (ZOFRAN) IV, ondansetron, traMADol   Assessment:   1. 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 Pulmonary hypertension  3. Acute on chronic kidney disease  4. Persistent hypertension  5. Acute on chronic anemia with iron deficiency s/p PRBC/IV iron  6. Severe hypoalbuminemia with proteinuria  7. Gastric  ulcer disease  8. OSA, noncompliant with CPAP prior to admission  9. COPD  10. Diabetes mellitus      Plan/Discussion:    Volume status improving. Weight down another 3 pounds. Continue lasix drip at 10 mg per hour.  Creatinine up slightly.  Will need RHC at some point to further evaluate.    Length of Stay: 14   CLEGG,AMY 02/28/2013, 7:45 AM  Advanced Heart Failure Team Pager [phone] (M-F; 7a - 4p)  Please contact [facility] for night-coverage after hours (4p -7a ) and weekends on [email or web address]  Patient seen and examined with Tonye Becket, NP. We discussed all aspects of the encounter. I  agree with the assessment and plan as stated above. Weight down a little bit but still with massive volume overload. I think her renal function is the major issue here and suspect she is heading toward HD. I spoke with Dr. Arrie Aran who agrees. Will plan RHC to see how bad RHF is. If cardiac output low will try to optimize volume status with inotropes and diuretics. If output OK will place Trialysis catheter. Will likely need to go to stepdown post cath.  I d/w her and her daughter.  Jennipher Weatherholtz,MD 8:58 AM

## 2013-02-28 NOTE — Progress Notes (Signed)
Patient's blood pressure 197/93 after patient returned from having Ultrasound of abdomen and renal doppler study all morning long and therefore not able to receive blood pressure medications. Blood pressure medications given prior to leaving for heart catheterization and will help blood pressure to decrease. Patient feels fine. Patient has just left for heart catheterization.  When patient returns, will continue to monitor patient and her blood pressure.

## 2013-02-28 NOTE — Progress Notes (Signed)
*  PRELIMINARY RESULTS* Vascular Ultrasound Renal Artery Duplex has been completed.   There is no obvious evidence of hemodynamically significant renal artery stenosis bilaterally. Intrarenal resistive indices are elevated bilaterally. There is evidence of a  2.7cm hypoechoic lesion of unknown etiology of the superior left kidney.  02/28/2013 11:52 AM Gertie Fey, RVT, RDCS, RDMS

## 2013-02-28 NOTE — Progress Notes (Signed)
24 hour urine has to be restarted for patient has been in procedures and test all day long during the day shift. Patient had Ultrasound, renal doppler studies, Heart cath/ HD placement, and Dialysis all done today which would interfere with getting 24 hour urine properly. Notified oncoming nurse to restart 24hour urine after return from Hemodialysis. Will continue to monitor to end of shift. Will notify Medical team via sticky note.

## 2013-02-28 NOTE — Progress Notes (Signed)
The patient was not ready to go to bed at this time. Patient says she will call when she's ready to go on her CPAP machine. RT will continue to assist as needed.

## 2013-02-28 NOTE — Progress Notes (Signed)
Per request of patient- CSW assisted with completion of a Health Care Power of [id number].  She has named her daughter Lazaro Arms as her HCPOA. Copy placed on chart and patient returned original and copies.  Patient and daughter were very appreciative of CSW's intervention.  No further CSW needs identified. CSW signing. Lorri Frederick. [place]  [phone]

## 2013-03-01 DIAGNOSIS — N183 Chronic kidney disease, stage 3 (moderate): Secondary | ICD-10-CM

## 2013-03-01 DIAGNOSIS — I5033 Acute on chronic diastolic (congestive) heart failure: Secondary | ICD-10-CM

## 2013-03-01 DIAGNOSIS — N179 Acute kidney failure, unspecified: Secondary | ICD-10-CM

## 2013-03-01 DIAGNOSIS — Z992 Dependence on renal dialysis: Secondary | ICD-10-CM

## 2013-03-01 LAB — POCT I-STAT 3, VENOUS BLOOD GAS (G3P V)
Acid-Base Excess: 2 mmol/L (ref 0.0–2.0)
O2 Saturation: 57 %
pCO2, Ven: 49.1 mmHg (ref 45.0–50.0)
pO2, Ven: 31 mmHg (ref 30.0–45.0)

## 2013-03-01 LAB — BASIC METABOLIC PANEL
BUN: 57 mg/dL — ABNORMAL HIGH (ref 6–23)
Chloride: 101 mEq/L (ref 96–112)
Creatinine, Ser: 2.07 mg/dL — ABNORMAL HIGH (ref 0.50–1.10)
GFR calc Af Amer: 30 mL/min — ABNORMAL LOW (ref >90)
GFR calc non Af Amer: 26 mL/min — ABNORMAL LOW (ref >90)

## 2013-03-01 LAB — GLUCOSE, CAPILLARY
Glucose-Capillary: 257 mg/dL — ABNORMAL HIGH (ref 70–99)
Glucose-Capillary: 153 mg/dL — ABNORMAL HIGH (ref 70–99)
Glucose-Capillary: 97 mg/dL (ref 70–99)
Glucose-Capillary: 116 mg/dL — ABNORMAL HIGH (ref 70–99)

## 2013-03-01 LAB — C3 COMPLEMENT: C3 Complement: 109 mg/dL (ref 90–180)

## 2013-03-01 LAB — ANTISTREPTOLYSIN O TITER: ASO: <25 IU/mL (ref <409)

## 2013-03-01 LAB — HEPATITIS PANEL, ACUTE
HCV Ab: REACTIVE — AB
Hep A IgM: NONREACTIVE
Hep B C IgM: NONREACTIVE

## 2013-03-01 LAB — CBC
HCT: 25.7 % — ABNORMAL LOW (ref 36.0–46.0)
MCHC: 33.1 g/dL (ref 30.0–36.0)
MCV: 88.9 fL (ref 78.0–100.0)
RDW: 15.6 % — ABNORMAL HIGH (ref 11.5–15.5)

## 2013-03-01 LAB — HEPATITIS B CORE ANTIBODY, TOTAL: Hep B Core Total Ab: NONREACTIVE

## 2013-03-01 LAB — MPO/PR-3 (ANCA) ANTIBODIES
Myeloperoxidase Abs: <1 AU/mL (ref <20)
Serine Protease 3: <1 AU/mL (ref <20)

## 2013-03-01 LAB — HEPATITIS B SURFACE ANTIBODY,QUALITATIVE: Hep B S Ab: NEGATIVE

## 2013-03-01 LAB — PROTEIN, URINE, 24 HOUR: Protein, Urine: 293 mg/dL

## 2013-03-01 LAB — C4 COMPLEMENT: Complement C4, Body Fluid: 22 mg/dL (ref 10–40)

## 2013-03-01 MED ORDER — HYDROCODONE-ACETAMINOPHEN 5-325 MG PO TABS
1.0000 | ORAL_TABLET | Freq: Four times a day (QID) | ORAL | Status: DC | PRN
  Administered 2013-03-01 – 2013-03-02 (×2): 1 via ORAL
  Filled 2013-03-01 (×2): qty 1

## 2013-03-01 NOTE — Progress Notes (Signed)
PT Cancellation Note  Unable to see pt at this time 2* pt is at dialysis. Will follow.   Ralene Bathe Kistler PT 03/01/2013  [phone]

## 2013-03-01 NOTE — Progress Notes (Signed)
VASCULAR LAB PRELIMINARY  PRELIMINARY  PRELIMINARY  PRELIMINARY  Right  Upper Extremity Vein Map    Cephalic  Segment Diameter Depth Comment  1. Axilla 2.08 mm mm   2. Mid upper arm 0.87 mm mm   3. Above AC 1.08 mm mm   4. In AC  1.43mm mm   5. Below AC mm mm   6. Mid forearm mm mm   7. Wrist mm mm    mm mm    mm mm    mm mm    Basilic  Segment Diameter Depth Comment  1. Axilla mm mm   2. Mid upper arm 3 mm 10.8 mm   3. Above AC 1.54 mm 5.98 mm   4. In AC 1.54 mm 2 mm   5. Below AC 0.96 mm 2 mm   6. Mid forearm mm mm   7. Wrist mm mm    mm mm    mm mm    mm mm     Left Upper Extremity Vein Map    Cephalic  Segment Diameter Depth Comment  1. Axilla 1.12 mm mm   2. Mid upper arm 1.42 mm mm   3. Above AC mm mm     Thrombosed   4. In AC 19.5 mm mm   5. Below AC 2.12 mm mm     Following medial branch  6. Mid forearm 1.87 mm mm   7. Wrist 2.41 mm mm    mm mm    mm mm    mm mm    Basilic  Segment Diameter Depth Comment  1. Axilla mm mm   2. Mid upper arm 4.23 mm 17.5 mm     Then many branches  3. Above AC 4.8 mm 16.2 mm   4. In AC 4.98 mm 12.4 mm   5. Below AC 2.49mm 2 mm   6. Mid forearm mm mm   7. Wrist mm mm    mm mm    mm mm    mm mm     Chyenne Sobczak, RVT 03/01/2013, 5:19 PM

## 2013-03-01 NOTE — Progress Notes (Signed)
Patient is back from vascular lab; will continue to monitor patient. Lorretta Harp RN

## 2013-03-01 NOTE — Progress Notes (Signed)
Patient is being transported to vascular lab; will continue to monitor patient. Lorretta Harp RN

## 2013-03-01 NOTE — Progress Notes (Signed)
Advanced Heart Failure Rounding Note   Subjective:    RHC yesterday with elevated filling pressures and normal cardiac output. Trialysis catheter placed and had 3L removed in HD.  Still on lasix dripp. Brisk urine output overnight. Feels much better. "I can move." Less bloating. No dyspnea.   Objective:   Weight Range:  Vital Signs:   Temp:  [97.5 F (36.4 C)-99.4 F (37.4 C)] 99.4 F (37.4 C) (10/22 0601) Pulse Rate:  [56-72] 72 (10/22 0601) Resp:  [16-20] 18 (10/22 0601) BP: (141-202)/(66-93) 181/68 mmHg (10/22 0601) SpO2:  [95 %-100 %] 95 % (10/22 0601) Weight:  [75.1 kg (165 lb 9.1 oz)-80.1 kg (176 lb 9.4 oz)] 75.1 kg (165 lb 9.1 oz) (10/22 0601) Last BM Date: 02/27/13  Weight change: Filed Weights   02/28/13 1655 02/28/13 2020 03/01/13 0601  Weight: 80.1 kg (176 lb 9.4 oz) 76.7 kg (169 lb 1.5 oz) 75.1 kg (165 lb 9.1 oz)    Intake/Output:   Intake/Output Summary (Last 24 hours) at 03/01/13 0730 Last data filed at 03/01/13 0610  Gross per 24 hour  Intake      0 ml  Output   5180 ml  Net  -5180 ml     Physical Exam: General: . No resp difficulty Sitting in chair HEENT: normal Neck: supple. Trialysis cath RIJ. Carotids 2+ bilat; no bruits. No lymphadenopathy or thryomegaly appreciated. Cor: PMI nondisplaced. Regular rate & rhythm. No rubs, gallops or murmurs. Lungs: clear Abdomen: soft, nontender, mildly distended (improved). No hepatosplenomegaly. No bruits or masses. Good bowel sounds. Extremities: no cyanosis, clubbing, rash, R and LLE 2+ edema Neuro: alert & orientedx3, cranial nerves grossly intact. moves all 4 extremities w/o difficulty. Affect pleasant  Telemetry: SR 60s  Labs: Basic Metabolic Panel:  Recent Labs Lab 02/24/13 0430 02/25/13 0445 02/26/13 0546 02/27/13 0631 02/28/13 0530 02/28/13 1530  NA 139 141 141 141 142  --   K 4.1 3.8 3.5 3.3* 3.8  --   CL 103 105 104 103 103  --   CO2 25 21 25 27 29   --   GLUCOSE 110* 194* 95 125* 91  --    BUN 68* 77* 81* 84* 89*  --   CREATININE 2.67* 2.93* 2.83* 2.85* 2.99* 3.02*  CALCIUM 8.2* 8.1* 8.1* 8.0* 7.9*  --   MG  --   --   --   --  1.8  --   PHOS  --   --   --   --  4.5  --     Liver Function Tests:  Recent Labs Lab 02/28/13 0530  AST 59*  ALT 43*  ALKPHOS 108  BILITOT 0.1*  PROT 5.4*  ALBUMIN 1.7*   No results found for this basename: LIPASE, AMYLASE,  in the last 168 hours No results found for this basename: AMMONIA,  in the last 168 hours  CBC:  Recent Labs Lab 02/25/13 1110 02/28/13 0530 02/28/13 1530 03/01/13 0620  WBC 13.0* 8.8 8.4 8.5  HGB 8.9* 7.8* 7.8* 8.5*  HCT 26.7* 23.9* 23.3* 25.7*  MCV 88.1 88.8 88.9 88.9  PLT 298 246 221 244    Cardiac Enzymes: No results found for this basename: CKTOTAL, CKMB, CKMBINDEX, TROPONINI,  in the last 168 hours  BNP: BNP (last 3 results)  Recent Labs  02/14/13 2333 02/22/13 0709 02/26/13 0546  PROBNP 8501.0* 5090.0* 7568.0*     Other results:    Imaging: US Abdomen Complete  02/28/2013   CLINICAL DATA:  Acute kidney  injury. Hypoalbuminemia and anasarca with ascites.  EXAM: ULTRASOUND ABDOMEN COMPLETE  COMPARISON:  02/15/2013  FINDINGS: Gallbladder  No gallstones or wall thickening visualized. No sonographic Murphy sign noted.  Common bile duct  Diameter: 8.6 mm. No duct stone is seen.  Liver  No focal lesion identified. Within normal limits in parenchymal echogenicity.  IVC  No abnormality visualized.  Pancreas  Visualized portion unremarkable.  Spleen  Spleen is borderline enlarged with a volume of 383 mL. No splenic mass or focal lesion.  Right Kidney  Length: 9.6 cm. Increased renal parenchymal echogenicity. No mass or hydronephrosis.  Left Kidney  Length: 11.7 cm. Increased renal parenchymal echogenicity. No mass or hydronephrosis.  Abdominal aorta  No aneurysm visualized.  Small amount of ascites is seen adjacent to the liver and spleen and in the perinephric spaces.  IMPRESSION: 1. Echogenic  kidneys consistent with medical renal disease. No hydronephrosis. 2. Borderline splenomegaly of unclear etiology. 3. Dilation of the common bile duct to 8.6 mm. No duct stone is seen. There is no pancreatic mass. This may be chronic. 4. Small amount of ascites. 5. No other abnormalities. No acute findings.   Electronically Signed   By: Amie Portland M.D.   On: 02/28/2013 12:12     Medications:     Scheduled Medications: . aspirin EC  81 mg Oral Daily  . carvedilol  40 mg Oral Daily  . ferrous sulfate  325 mg Oral TID WC  . gabapentin  100 mg Oral BID  . heparin  5,000 Units Subcutaneous Q8H  . hydrALAZINE  100 mg Oral TID  . insulin aspart  0-5 Units Subcutaneous QHS  . insulin aspart  0-9 Units Subcutaneous TID WC  . insulin aspart  8 Units Subcutaneous TID WC  . insulin glargine  18 Units Subcutaneous QHS  . isosorbide mononitrate  120 mg Oral Daily  . metolazone  5 mg Oral BID  . NIFEdipine  60 mg Oral Daily  . pantoprazole  40 mg Oral BID  . polyethylene glycol  17 g Oral Daily  . predniSONE  10 mg Oral Q breakfast  . sodium chloride  3 mL Intravenous Q12H  . sodium chloride  3 mL Intravenous Q12H    Infusions: . furosemide (LASIX) infusion 10 mg/hr (03/01/13 0040)    PRN Medications: sodium chloride, acetaminophen, acetaminophen, acetaminophen, albuterol, labetalol, ondansetron (ZOFRAN) IV, ondansetron (ZOFRAN) IV, ondansetron, sodium chloride, traMADol   Assessment:   1. 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 Pulmonary venous hypertension  3. Acute on chronic kidney disease, IV-V  4. Persistent hypertension  5. Acute on chronic anemia with iron deficiency s/p PRBC/IV iron  6. Severe hypoalbuminemia with proteinuria  7. Gastric ulcer disease  8. OSA, noncompliant with CPAP prior to admission  9. COPD  10. Diabetes mellitus    Plan/Discussion:    Volume  status much improved with HD and IV lasix. Would continue. Await renal recommendations.   We will continue to follow.   Length of Stay: 15   Carey Johndrow 03/01/2013, 7:30 AM  Advanced Heart Failure Team Pager [phone] (M-F; 7a - 4p)  Please contact [facility] for night-coverage after hours (4p -7a ) and weekends on [email or web address]  Patient seen and examined with Tonye Becket, NP. We discussed all aspects of the encounter. I agree with the assessment and plan as stated above. Weight down a little bit but still with  massive volume overload. I think her renal function is the major issue here and suspect she is heading toward HD. I spoke with Dr. Arrie Aran who agrees. Will plan RHC to see how bad RHF is. If cardiac output low will try to optimize volume status with inotropes and diuretics. If output OK will place Trialysis catheter. Will likely need to go to stepdown post cath.  I d/w her and her daughter.  Kayti Poss,MD 7:30 AM

## 2013-03-01 NOTE — Progress Notes (Signed)
Patient ID: Madeline [name], female   DOB: 09-06-1955, 57 y.o.   MRN: [id number] S:feels better after HD yesterday. O:BP 189/95  Pulse 69  Temp(Src) 98.3 F (36.8 C) (Oral)  Resp 16  Ht 4\' 9"  (1.448 m)  Wt 75 kg (165 lb 5.5 oz)  BMI 35.77 kg/m2  SpO2 99%  Intake/Output Summary (Last 24 hours) at 03/01/13 0927 Last data filed at 03/01/13 0800  Gross per 24 hour  Intake    610 ml  Output   5581 ml  Net  -4971 ml   Intake/Output: I/O last 3 completed shifts: In: 490 [P.O.:120; I.V.:370] Out: 6431 [Urine:3430; Other:3000; Stool:1]  Intake/Output this shift:  Total I/O In: 360 [P.O.:360] Out: 400 [Urine:400] Weight change: -1.1 kg (-2 lb 6.8 oz) Gen:WD WN obese AAF in NAD CVS:no rub Resp:decreased BS at bases NWG:NFAOZH Ext:1+ edema   Recent Labs Lab 02/23/13 0600 02/24/13 0430 02/25/13 0445 02/26/13 0546 02/27/13 0631 02/28/13 0530 02/28/13 1530 03/01/13 0620  NA 139 139 141 141 141 142  --  140  K 4.3 4.1 3.8 3.5 3.3* 3.8  --  3.8  CL 102 103 105 104 103 103  --  101  CO2 24 25 21 25 27 29   --  27  GLUCOSE 252* 110* 194* 95 125* 91  --  283*  BUN 62* 68* 77* 81* 84* 89*  --  57*  CREATININE 2.62* 2.67* 2.93* 2.83* 2.85* 2.99* 3.02* 2.07*  ALBUMIN  --   --   --   --   --  1.7*  --   --   CALCIUM 8.2* 8.2* 8.1* 8.1* 8.0* 7.9*  --  8.2*  PHOS  --   --   --   --   --  4.5  --   --   AST  --   --   --   --   --  59*  --   --   ALT  --   --   --   --   --  43*  --   --    Liver Function Tests:  Recent Labs Lab 02/28/13 0530  AST 59*  ALT 43*  ALKPHOS 108  BILITOT 0.1*  PROT 5.4*  ALBUMIN 1.7*   No results found for this basename: LIPASE, AMYLASE,  in the last 168 hours No results found for this basename: AMMONIA,  in the last 168 hours CBC:  Recent Labs Lab 02/25/13 1110 02/28/13 0530 02/28/13 1530 03/01/13 0620  WBC 13.0* 8.8 8.4 8.5  HGB 8.9* 7.8* 7.8* 8.5*  HCT 26.7* 23.9* 23.3* 25.7*  MCV 88.1 88.8 88.9 88.9  PLT 298 246 221 244   Cardiac  Enzymes: No results found for this basename: CKTOTAL, CKMB, CKMBINDEX, TROPONINI,  in the last 168 hours CBG:  Recent Labs Lab 02/28/13 0603 02/28/13 1250 02/28/13 1637 02/28/13 2053 03/01/13 0624  GLUCAP 85 122* 118* 102* 257*    Iron Studies:  Recent Labs  02/28/13 0530  IRON 56  TIBC 187*   Studies/Results: US Abdomen Complete  02/28/2013   CLINICAL DATA:  Acute kidney injury. Hypoalbuminemia and anasarca with ascites.  EXAM: ULTRASOUND ABDOMEN COMPLETE  COMPARISON:  02/15/2013  FINDINGS: Gallbladder  No gallstones or wall thickening visualized. No sonographic Murphy sign noted.  Common bile duct  Diameter: 8.6 mm. No duct stone is seen.  Liver  No focal lesion identified. Within normal limits in parenchymal echogenicity.  IVC  No abnormality visualized.  Pancreas  Visualized  portion unremarkable.  Spleen  Spleen is borderline enlarged with [name] volume of 383 mL. No splenic mass or focal lesion.  Right Kidney  Length: 9.6 cm. Increased renal parenchymal echogenicity. No mass or hydronephrosis.  Left Kidney  Length: 11.7 cm. Increased renal parenchymal echogenicity. No mass or hydronephrosis.  Abdominal aorta  No aneurysm visualized.  Small amount of ascites is seen adjacent to the liver and spleen and in the perinephric spaces.  IMPRESSION: 1. Echogenic kidneys consistent with medical renal disease. No hydronephrosis. 2. Borderline splenomegaly of unclear etiology. 3. Dilation of the common bile duct to 8.6 mm. No duct stone is seen. There is no pancreatic mass. This may be chronic. 4. Small amount of ascites. 5. No other abnormalities. No acute findings.   Electronically Signed   By: Amie Portland M.D.   On: 02/28/2013 12:12   . aspirin EC  81 mg Oral Daily  . carvedilol  40 mg Oral Daily  . ferrous sulfate  325 mg Oral TID WC  . gabapentin  100 mg Oral BID  . heparin  5,000 Units Subcutaneous Q8H  . hydrALAZINE  100 mg Oral TID  . insulin aspart  0-5 Units Subcutaneous QHS  .  insulin aspart  0-9 Units Subcutaneous TID WC  . insulin aspart  8 Units Subcutaneous TID WC  . insulin glargine  18 Units Subcutaneous QHS  . isosorbide mononitrate  120 mg Oral Daily  . metolazone  5 mg Oral BID  . NIFEdipine  60 mg Oral Daily  . pantoprazole  40 mg Oral BID  . polyethylene glycol  17 g Oral Daily  . predniSONE  10 mg Oral Q breakfast  . sodium chloride  3 mL Intravenous Q12H  . sodium chloride  3 mL Intravenous Q12H    BMET    Component Value Date/Time   NA 140 03/01/2013 0620   K 3.8 03/01/2013 0620   CL 101 03/01/2013 0620   CO2 27 03/01/2013 0620   GLUCOSE 283* 03/01/2013 0620   BUN 57* 03/01/2013 0620   CREATININE 2.07* 03/01/2013 0620   CALCIUM 8.2* 03/01/2013 0620   GFRNONAA 26* 03/01/2013 0620   GFRAA 30* 03/01/2013 0620   CBC    Component Value Date/Time   WBC 8.5 03/01/2013 0620   RBC 2.89* 03/01/2013 0620   RBC 2.79* 02/18/2013 1345   HGB 8.5* 03/01/2013 0620   HCT 25.7* 03/01/2013 0620   PLT 244 03/01/2013 0620   MCV 88.9 03/01/2013 0620   MCH 29.4 03/01/2013 0620   MCHC 33.1 03/01/2013 0620   RDW 15.6* 03/01/2013 0620   LYMPHSABS 1.1 02/14/2013 1853   MONOABS 0.6 02/14/2013 1853   EOSABS 0.1 02/14/2013 1853   BASOSABS 0.0 02/14/2013 1853     Assessment/Plan:  1. CHF- preserved CO as seen by R-heart cath yesterday.  Markedly improved volume following HD with UF yesterday.  Feels better.  Plan for HD again today with more UF.  Hopefully, the improved volume status will help her cardiorenal syndrome and will not require longterm RRT, although she has had progressive CKD over the last year and [name] half.  Will need to proceed with placement of permanent access. 2. Proteinuria- likely related to longstanding, poorly controlled DM and would not change therapy even if she had nephrotic range proteinuria. Would not add ACE/ARB given her multiple episodes of AKI/CKD related to right-sided HF and diuresis. We will quantify and check other serologies.  Cont with diuresis for now and plan for HD  today and again tomorrow. 3. AKI/CKD- s/p 1 session of HD with marked improvement.  Plan again today and follow.  She has had multiple episodes of AKI and actually had been on HD in the past. Not certain if this will be permanent or temporary, however given her advanced CKD, will likely need it longterm at some point.  Discussed the ways to delay the progression of CKD with  1. Tight BP control with goal BP <130/80 2. Tight glucose control with goal Hgb A1c <7% 3. Use of an ace/arb (would not in her case given frequent episodes of AKI/CKD) 4. Avoidance of nephrotoxic agents such as NSAIDs/COX-II I's and IV contrast 4. OSA/pulm HTN- plan for HD with UF and would strongly recommend resumption of CPAP 5. HTN- not optimal. Would recommend verapamil or diltiazem rather than nifidipine for longer acting CCB and antiproteinuric effects. Will also UF more with HD today. 6. ACDz- as above. 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 DM- poorly controlled. Plan per primary svc 9. Hypoalbuminemia- again possibly related to diabetic nephropathy but also on DDx is fatty liver disease or cardiac cirrhosis. 10. Anasarca/ascites- as above 11. CAD s/p NSTEMI 12. Pulm HTN- recommend CPAP compliance 13. Vascular access- appreciate Dr. Jones Broom for placing temp dialysis catheter. Will order vein mapping and consult VVS to evaluate for placement of AVF/AVG this admission and possible need for tunneled HD cath if her renal fnx does not improve with improved volume status. 14.   Madeline [name]

## 2013-03-01 NOTE — Progress Notes (Signed)
[facility] PROGRESS NOTE Interim History: 57 year old female patient with history of congestive heart failure coronary artery disease as well as chronic kidney disease. She also has diabetes and chronic anemia related to her kidney disease. She presented to the hospital with complaints of shortness of breath and was found to have systolic blood pressure greater than 200. Chest x-ray was consistent with possible pulmonary edema. Due to the degree of respiratory failure she did require BiPAP in the emergency department. She was also temporarily started on IV nitroglycerin and IV Lasix was given with subsequent improvement in the patient's blood pressure and breathing therefore BiPAP was weaned off. Pt was doing quite well with diuresis but then started having SOB on Wenesday, her lasix was increase and started on zaroxolyn. Her SOB improved and edema along with JVD was resolving. Then again Sunday her JVD increase and edema worsen. Cardiology was consulted for RHC as was nephrology for ? Proteniuria.    Assessment/Plan: Acute respiratory failure with hypoxia due to flash pulmonary edema due to malingnant HTN, Acute biventricular diastolic CHF (congestive heart failure)-stage 3/HTN (hypertension), malignant Status post RHC 10/21 which revealed elevated filling pressures with normal cardiac output. Now on IV lasix. Is also status post dialysis. Is feeling better. Continue coreg, Hydralazine and Imdur. Cont zaroxolyn for now. Patient may require long term RRT in future but not now per Nephrology. Off O2.  Chronic kidney disease (CKD), stage IV (severe)  As above. Nephrology to consult vascular for permanent access.  Anemia, chronic disease/Acute blood loss anemia  Now stable. Hgb nadir 5.5. EGD revealed small multiple focal antral ulcers. PPI BID.  DM (diabetes mellitus)  On Lantus. Cont monitor CBG's. A1C was 7.7.  HTN BP poorly controlled. Unclear if she has received her medications today.  Monitor over course of day. PRN Labetalol  Code Status: full Family Communication: none  Disposition Plan: Per nephrology and cardiology. Will likely go home when ready with HH.   Consultants:  Cards  Nephrology  Procedures: ECHO 10.8.2014 Ejection fraction was 65%. Wall motion was normal; there were no regional wall motion abnormalities. Findings consistent with left ventricular diastolic dysfunction. Doppler parameters are consistent with high ventricular filling pressure  EGD 02/16/13 Scattered gastric ulcerations  Antibiotics:  None   HPI/Subjective: She feels better. No chest pain. No nausea or vomiting.  Objective: Filed Vitals:   03/01/13 0930 03/01/13 1000 03/01/13 1030 03/01/13 1100  BP: 191/89 197/89 232/108 204/98  Pulse: 64 64 72 67  Temp:      TempSrc:      Resp: 12 14 17 16   Height:      Weight:      SpO2:        Filed Weights   02/28/13 2020 03/01/13 0601 03/01/13 0828  Weight: 76.7 kg (169 lb 1.5 oz) 75.1 kg (165 lb 9.1 oz) 75 kg (165 lb 5.5 oz)   10/21 0701 - 10/22 0700 In: 250 [I.V.:250] Out: 5181 [Urine:2180; Stool:1]   Exam: General: Alert, awake, oriented x3, in no acute distress.  Heart: Regular rate and rhythm. + 2 edema with scaling of her skin Lungs: diminished, no wheezing Abdomen: Soft, nontender, nondistended, positive bowel sounds.   Data Reviewed: Basic Metabolic Panel:  Recent Labs Lab 02/25/13 0445 02/26/13 0546 02/27/13 0631 02/28/13 0530 02/28/13 1530 03/01/13 0620  NA 141 141 141 142  --  140  K 3.8 3.5 3.3* 3.8  --  3.8  CL 105 104 103 103  --  101  CO2  21 25 27 29   --  27  GLUCOSE 194* 95 125* 91  --  283*  BUN 77* 81* 84* 89*  --  57*  CREATININE 2.93* 2.83* 2.85* 2.99* 3.02* 2.07*  CALCIUM 8.1* 8.1* 8.0* 7.9*  --  8.2*  MG  --   --   --  1.8  --   --   PHOS  --   --   --  4.5  --   --    Liver Function Tests:  Recent Labs Lab 02/28/13 0530  AST 59*  ALT 43*  ALKPHOS 108  BILITOT 0.1*  PROT  5.4*  ALBUMIN 1.7*   CBC:  Recent Labs Lab 02/25/13 1110 02/28/13 0530 02/28/13 1530 03/01/13 0620  WBC 13.0* 8.8 8.4 8.5  HGB 8.9* 7.8* 7.8* 8.5*  HCT 26.7* 23.9* 23.3* 25.7*  MCV 88.1 88.8 88.9 88.9  PLT 298 246 221 244   BNP (last 3 results)  Recent Labs  02/14/13 2333 02/22/13 0709 02/26/13 0546  PROBNP 8501.0* 5090.0* 7568.0*   CBG:  Recent Labs Lab 02/28/13 0603 02/28/13 1250 02/28/13 1637 02/28/13 2053 03/01/13 0624  GLUCAP 85 122* 118* 102* 257*    Studies: US Abdomen Complete  02/28/2013   CLINICAL DATA:  Acute kidney injury. Hypoalbuminemia and anasarca with ascites.  EXAM: ULTRASOUND ABDOMEN COMPLETE  COMPARISON:  02/15/2013  FINDINGS: Gallbladder  No gallstones or wall thickening visualized. No sonographic Murphy sign noted.  Common bile duct  Diameter: 8.6 mm. No duct stone is seen.  Liver  No focal lesion identified. Within normal limits in parenchymal echogenicity.  IVC  No abnormality visualized.  Pancreas  Visualized portion unremarkable.  Spleen  Spleen is borderline enlarged with a volume of 383 mL. No splenic mass or focal lesion.  Right Kidney  Length: 9.6 cm. Increased renal parenchymal echogenicity. No mass or hydronephrosis.  Left Kidney  Length: 11.7 cm. Increased renal parenchymal echogenicity. No mass or hydronephrosis.  Abdominal aorta  No aneurysm visualized.  Small amount of ascites is seen adjacent to the liver and spleen and in the perinephric spaces.  IMPRESSION: 1. Echogenic kidneys consistent with medical renal disease. No hydronephrosis. 2. Borderline splenomegaly of unclear etiology. 3. Dilation of the common bile duct to 8.6 mm. No duct stone is seen. There is no pancreatic mass. This may be chronic. 4. Small amount of ascites. 5. No other abnormalities. No acute findings.   Electronically Signed   By: Amie Portland M.D.   On: 02/28/2013 12:12    Scheduled Meds: . aspirin EC  81 mg Oral Daily  . carvedilol  40 mg Oral Daily  .  ferrous sulfate  325 mg Oral TID WC  . gabapentin  100 mg Oral BID  . heparin  5,000 Units Subcutaneous Q8H  . hydrALAZINE  100 mg Oral TID  . insulin aspart  0-5 Units Subcutaneous QHS  . insulin aspart  0-9 Units Subcutaneous TID WC  . insulin aspart  8 Units Subcutaneous TID WC  . insulin glargine  18 Units Subcutaneous QHS  . isosorbide mononitrate  120 mg Oral Daily  . metolazone  5 mg Oral BID  . NIFEdipine  60 mg Oral Daily  . pantoprazole  40 mg Oral BID  . polyethylene glycol  17 g Oral Daily  . predniSONE  10 mg Oral Q breakfast  . sodium chloride  3 mL Intravenous Q12H  . sodium chloride  3 mL Intravenous Q12H   Continuous Infusions: . furosemide (LASIX)  infusion 10 mg/hr (03/01/13 0040)     [facility]  [facility] Pager [phone]  If 8PM-8AM, please contact night-coverage at [email or web address], password [facility] 03/01/2013, 11:29 AM  LOS: 15 days

## 2013-03-01 NOTE — Progress Notes (Signed)
OT Cancellation Note  Patient Details Name: Madeline [name] MRN: [id number] DOB: 1955/05/15   Cancelled Treatment:    Reason Eval/Treat Not Completed: Patient at procedure or test/ unavailable. Pt at hemodialysis, will re attempt later today as appropriate   Galen Manila  03/01/2013, 11:22 AM

## 2013-03-01 NOTE — Progress Notes (Signed)
Patient is being transported to Dialysis. D. Ileah Falkenstein RN 

## 2013-03-01 NOTE — Progress Notes (Signed)
Patient has returned to the unit from Dialysis; will continue to monitor patient. Lorretta Harp RN

## 2013-03-01 NOTE — Progress Notes (Signed)
Occupational Therapy Treatment Patient Details Name: Madeline [name] MRN: [id number] DOB: 03/29/1956 Today's Date: 03/01/2013 Time: 1435-1450 OT Time Calculation (min): 15 min  OT Assessment / Plan / Recommendation  History of present illness 57 year old female patient with history of congestive heart failure coronary artery disease as well as chronic kidney disease. She also has diabetes and chronic anemia related to her kidney disease. She presented to the hospital with complaints of shortness of breath and was found systolic blood pressure greater than 200. Chest x-ray was consistent with possible pulmonary edema. Patient had rapid onset of shortness of breath according to her family and no constitutional symptoms that would lead one to believe she had an infectious process. He over the past several days patient has also had increasing swelling of her extremities. She has had several CHF exacerbations over the past year and has been hospitalized.   OT comments  Pt making excellent progress with functional goals  Follow Up Recommendations  Home health OT;Supervision - Intermittent    Barriers to Discharge   None    Equipment Recommendations  None recommended by OT    Recommendations for Other Services    Frequency Min 2X/week   Progress towards OT Goals Progress towards OT goals: Progressing toward goals  Plan Discharge plan remains appropriate    Precautions / Restrictions Precautions Precautions: Fall Restrictions Weight Bearing Restrictions: No   Pertinent Vitals/Pain No c/o pain    ADL  Grooming: Performed;Wash/dry hands;Wash/dry face;Supervision/safety Upper Body Bathing: Simulated;Supervision/safety;Set up;Min guard Where Assessed - Upper Body Bathing: Unsupported standing Lower Body Bathing: Simulated;Supervision/safety;Set up;Min guard Where Assessed - Lower Body Bathing: Supported standing Upper Body Dressing: Performed;Supervision/safety;Min guard;Set up Where  Assessed - Upper Body Dressing: Unsupported standing Lower Body Dressing: Performed;Supervision/safety;Set up Where Assessed - Lower Body Dressing: Unsupported sitting Toilet Transfer: Performed;Modified independent Toilet Transfer Method: Sit to Barista: Materials engineer and Hygiene: Supervision/safety Where Assessed - Engineer, mining and Hygiene: Standing Tub/Shower Transfer Method: Not assessed Equipment Used: Other (comment) (BSC)    OT Diagnosis:    OT Problem List:   OT Treatment Interventions:     OT Goals(current goals can now be found in the care plan section)    Visit Information  Last OT Received On: 03/01/13 Assistance Needed: +1 Reason Eval/Treat Not Completed: Patient at procedure or test/ unavailable History of Present Illness: 57 year old female patient with history of congestive heart failure coronary artery disease as well as chronic kidney disease. She also has diabetes and chronic anemia related to her kidney disease. She presented to the hospital with complaints of shortness of breath and was found systolic blood pressure greater than 200. Chest x-ray was consistent with possible pulmonary edema. Patient had rapid onset of shortness of breath according to her family and no constitutional symptoms that would lead one to believe she had an infectious process. He over the past several days patient has also had increasing swelling of her extremities. She has had several CHF exacerbations over the past year and has been hospitalized.    Subjective Data      Prior Functioning       Cognition  Cognition Arousal/Alertness: Awake/alert Behavior During Therapy: WFL for tasks assessed/performed Overall Cognitive Status: Within Functional Limits for tasks assessed    Mobility  Bed Mobility Bed Mobility: Not assessed Transfers Transfers: Sit to Stand;Stand to Sit Sit to Stand: 6: Modified  independent (Device/Increase time);From chair/3-in-1;From toilet;With upper extremity assist Stand to Sit: 6: Modified independent (  Device/Increase time);To chair/3-in-1;To toilet;Without upper extremity assist Details for Transfer Assistance: pt able to complete with and without use of hands safely    Exercises      Balance Dynamic Standing Balance Dynamic Standing - Balance Support: No upper extremity supported;During functional activity Dynamic Standing - Level of Assistance: 5: Stand by assistance   End of Session OT - End of Session Activity Tolerance: Patient tolerated treatment well Patient left: in chair;with call bell/phone within reach  GO     Galen Manila 03/01/2013, 3:15 PM

## 2013-03-01 NOTE — Consult Note (Signed)
[facility] NOTE   MRN : [id number]  Reason for Consult: ESRD  Referring Physician: Irena Cords   History of Present Illness: This is a 57 y.o. Female with known CKD stage 3-4.  She was brought to the hospital secondary to increased shortness of breath and edema.  She has been on hemodialysis in the past.  She currently has a temporary right IJ for dialysis.We have been consulted to place a diatek catheter and permanent access.  pertinent  medical history includes: diabetes, CHF, CAD with pulmonary edema.  She takes insulin for her diabetes and Asprin daily.  She is not on a statin or beta blockers at this time.     Current Facility-Administered Medications  Medication Dose Route Frequency Provider Last Rate Last Dose  . 0.9 %  sodium chloride infusion  250 mL Intravenous PRN Dolores Patty, MD      . acetaminophen (TYLENOL) tablet 650 mg  650 mg Oral Q6H PRN Eduard Clos, MD   650 mg at 02/15/13 7829   Or  . acetaminophen (TYLENOL) suppository 650 mg  650 mg Rectal Q6H PRN Eduard Clos, MD      . acetaminophen (TYLENOL) tablet 650 mg  650 mg Oral Q4H PRN Dolores Patty, MD      . albuterol (PROVENTIL HFA;VENTOLIN HFA) 108 (90 BASE) MCG/ACT inhaler 2 puff  2 puff Inhalation Q4H PRN Marinda Elk, MD      . aspirin EC tablet 81 mg  81 mg Oral Daily Eduard Clos, MD   81 mg at 03/01/13 1247  . carvedilol (COREG CR) 24 hr capsule 40 mg  40 mg Oral Daily Marinda Elk, MD   40 mg at 03/01/13 1101  . ferrous sulfate tablet 325 mg  325 mg Oral TID WC Marinda Elk, MD   325 mg at 03/01/13 1248  . furosemide (LASIX) 250 mg in dextrose 5 % 250 mL infusion  10 mg/hr Intravenous Continuous Dolores Patty, MD 10 mL/hr at 03/01/13 0040 10 mg/hr at 03/01/13 0040  . gabapentin (NEURONTIN) capsule 100 mg  100 mg Oral BID Lonia Blood, MD   100 mg at 03/01/13 1248  . heparin injection 5,000 Units  5,000  Units Subcutaneous Q8H Dolores Patty, MD   5,000 Units at 03/01/13 5621  . hydrALAZINE (APRESOLINE) tablet 100 mg  100 mg Oral TID Calvert Cantor, MD   100 mg at 03/01/13 1100  . insulin aspart (novoLOG) injection 0-5 Units  0-5 Units Subcutaneous QHS Leanne Chang, NP   2 Units at 02/26/13 2127  . insulin aspart (novoLOG) injection 0-9 Units  0-9 Units Subcutaneous TID WC Leanne Chang, NP   2 Units at 03/01/13 1301  . insulin aspart (novoLOG) injection 8 Units  8 Units Subcutaneous TID WC Marinda Elk, MD   8 Units at 03/01/13 1301  . insulin glargine (LANTUS) injection 18 Units  18 Units Subcutaneous QHS Joseph Art, DO   18 Units at 02/28/13 2200  . isosorbide mononitrate (IMDUR) 24 hr tablet 120 mg  120 mg Oral Daily Eduard Clos, MD   120 mg at 03/01/13 1101  . labetalol (NORMODYNE,TRANDATE) injection 20 mg  20 mg Intravenous Q2H PRN Calvert Cantor, MD   20 mg at 02/26/13 1345  . metolazone (ZAROXOLYN) tablet 5 mg  5 mg Oral BID Marinda Elk, MD   5 mg at 03/01/13 1248  .  NIFEdipine (PROCARDIA-XL/ADALAT CC) 24 hr tablet 60 mg  60 mg Oral Daily Marinda Elk, MD   60 mg at 03/01/13 1248  . ondansetron (ZOFRAN) tablet 4 mg  4 mg Oral Q6H PRN Eduard Clos, MD       Or  . ondansetron [facility]) injection 4 mg  4 mg Intravenous Q6H PRN Eduard Clos, MD   4 mg at 02/25/13 1701  . ondansetron (ZOFRAN) injection 4 mg  4 mg Intravenous Q6H PRN Dolores Patty, MD      . pantoprazole (PROTONIX) EC tablet 40 mg  40 mg Oral BID Calvert Cantor, MD   40 mg at 03/01/13 1300  . polyethylene glycol (MIRALAX / GLYCOLAX) packet 17 g  17 g Oral Daily Marinda Elk, MD   17 g at 02/25/13 0932  . predniSONE (DELTASONE) tablet 10 mg  10 mg Oral Q breakfast Marinda Elk, MD   10 mg at 03/01/13 0981  . sodium chloride 0.9 % injection 3 mL  3 mL Intravenous Q12H Eduard Clos, MD   3 mL at 02/27/13 2228  . sodium chloride 0.9 % injection 3 mL  3  mL Intravenous Q12H Bevelyn Buckles Bensimhon, MD      . sodium chloride 0.9 % injection 3 mL  3 mL Intravenous PRN Dolores Patty, MD      . traMADol Janean Sark) tablet 50 mg  50 mg Oral TID PRN Eduard Clos, MD   50 mg at 02/27/13 2041    Pt meds include: Statin :No Betablocker: Yes ASA: Yes Other anticoagulants/antiplatelets: none  Past Medical History  Diagnosis Date  . CHF (congestive heart failure)     a. HFpEF with RHF/anasarca/pHTN.  . COPD (chronic obstructive pulmonary disease)   . Coronary artery disease     a. Per [place] records: NSTEMI 01/2012, tx medically given ARF but suspected CAD. b. Stress test 12/16/11 reported w/o ischemia.  . Hypertension   . Diabetes mellitus   . OSA (obstructive sleep apnea)     a. Pt reported used to use CPAP in [place], but "ran out" when came to [facility].  Marland Kitchen CKD (chronic kidney disease), stage III     a. Per [place] records: h/o ARF after CTA that ruled out PE.  Marland Kitchen Pulmonary hypertension     a. RHC 02/28/13: mod pulm HTN with normal PVR suggestive of predominantly pulmonary venous HTN.  . Right heart failure   . Anasarca     a. Per [place] records - due to pulm HTN with R HF.   Marland Kitchen Aseptic meningitis     a. 09/2012: adm in [place] for metabolic encephalopathy, oliguric tubular necrosis, anemia, HTN, possible CVA, HHNKA.  Marland Kitchen CVA (cerebral infarction)     a. Per [place] records, "possible CVA" 09/2012 but MRI reportedly negative.  . Abnormal Doppler ultrasound of carotid artery     a. Per [place] records: <50% LICA.    Past Surgical History  Procedure Laterality Date  . Tubal ligation    . Abdominal hysterectomy    . Esophagogastroduodenoscopy N/A 02/16/2013    Procedure: ESOPHAGOGASTRODUODENOSCOPY (EGD);  Surgeon: Graylin Shiver, MD;  Location: [facility] ENDOSCOPY;  Service: Endoscopy;  Laterality: N/A;    Social History History  Substance Use Topics  . Smoking status: Former Games developer  . Smokeless tobacco: Current User    Types: Snuff, Chew  . Alcohol Use: No    Family  History Family History  Problem Relation Age of Onset  .  Diabetes Mellitus II Sister   . Diabetes Mellitus II Brother   . CAD Brother     No Known Allergies   REVIEW OF SYSTEMS  General: [ ]  Weight loss, [ ]  Fever, [ ]  chills Neurologic: [ ]  Dizziness, [ ]  Blackouts, [ ]  Seizure [ ]  Stroke, [ ]  "Mini stroke", [ ]  Slurred speech, [ ]  Temporary blindness; [ ]  weakness in arms or legs, [ ]  Hoarseness [ ]  Dysphagia Cardiac: [ ]  Chest pain/pressure, [x ] Shortness of breath at rest [x ] Shortness of breath with exertion, [ ]  Atrial fibrillation or irregular heartbeat  Vascular: [ ]  Pain in legs with walking, [ ]  Pain in legs at rest, [ ]  Pain in legs at night,  [ ]  Non-healing ulcer, [ ]  Blood clot in vein/DVT,   Pulmonary: [ ]  Home oxygen, [ ]  Productive cough, [ ]  Coughing up blood, [ ]  Asthma,  [ ]  Wheezing [ ]  COPD [x]  sleep apnea/ pulmonary edema Musculoskeletal:  [ ]  Arthritis, [ ]  Low back pain, [ ]  Joint pain Hematologic: [ ]  Easy Bruising, [ ]  Anemia; [ ]  Hepatitis Gastrointestinal: [ ]  Blood in stool, [ ]  Gastroesophageal Reflux/heartburn, Urinary: [x ] chronic Kidney disease, [ ]  on HD - [ ]  MWF or [ ]  TTHS, [ ]  Burning with urination, [ ]  Difficulty urinating Skin: [ ]  Rashes, [ ]  Wounds Psychological: [ ]  Anxiety, [ ]  Depression  Physical Examination Filed Vitals:   03/01/13 1130 03/01/13 1200 03/01/13 1221 03/01/13 1244  BP: 199/101 229/101 228/98 214/67  Pulse: 66 64 66 65  Temp:   98.3 F (36.8 C) 98.8 F (37.1 C)  TempSrc:   Oral Oral  Resp: 14 16 14 18   Height:      Weight:   153 lb 7 oz (69.6 kg)   SpO2:   100% 98%   Body mass index is 33.19 kg/(m^2).  General:  WDWN in NAD HENT: WNL Eyes: Pupils equal Pulmonary: normal non-labored breathing sitting up in chair Cardiac: RRR, without  Murmurs, rubs or gallops; No carotid bruits Abdomen: soft, NT, no masses Skin: no rashes, ulcers noted;  no Gangrene , no cellulitis; no open wounds;   Vascular  Exam/Pulses:palpable radial brachial pulses bilateral.  Femoral DP/PT palpable bil.  Positive LE edema.  Right hand dominant  Musculoskeletal: no muscle wasting or atrophy; no edema  Neurologic: A&O X 3; Appropriate Affect ;  SENSATION: normal; MOTOR FUNCTION: 5/5 Symmetric Speech is fluent/normal   Significant Diagnostic Studies: CBC Lab Results  Component Value Date   WBC 8.5 03/01/2013   HGB 8.5* 03/01/2013   HCT 25.7* 03/01/2013   MCV 88.9 03/01/2013   PLT 244 03/01/2013    BMET    Component Value Date/Time   NA 140 03/01/2013 0620   K 3.8 03/01/2013 0620   CL 101 03/01/2013 0620   CO2 27 03/01/2013 0620   GLUCOSE 283* 03/01/2013 0620   BUN 57* 03/01/2013 0620   CREATININE 2.07* 03/01/2013 0620   CALCIUM 8.2* 03/01/2013 0620   GFRNONAA 26* 03/01/2013 0620   GFRAA 30* 03/01/2013 0620   Estimated Creatinine Clearance: 24.7 ml/min (by C-G formula based on Cr of 2.07).  COAG Lab Results  Component Value Date   INR 0.93 02/14/2013   INR 1.11 04/05/2012     Non-Invasive Vascular Imaging:   Pending vein mapping  ASSESSMENT: Poorly controlled HTN CKD stage 3-4 CHF with pulmonary edema Temporary RIJ diuresing is improving SOB  Plan: Dialysis access via  diatek/ AV fistula and/or graft. Right hand dominant     Thomasena Edis, EMMA MAUREEN 03/01/2013 1:07 PM  Agree with above Patient has vein mapping pending and we can likely schedule for left upper extremity access plus tunneled catheter on Friday a.m. Patient had no dialysis today. Please have patient available to go to OR Friday a.m.

## 2013-03-01 NOTE — Procedures (Signed)
Patient was seen on dialysis and the procedure was supervised. BFR 250 Via temp RIJ HD cath BP is 204/98.  Patient appears to be tolerating treatment well but will increase goal UF and give BP meds.  She feels much better.

## 2013-03-02 DIAGNOSIS — I5033 Acute on chronic diastolic (congestive) heart failure: Secondary | ICD-10-CM

## 2013-03-02 LAB — CBC
HCT: 24.8 % — ABNORMAL LOW (ref 36.0–46.0)
Hemoglobin: 8.2 g/dL — ABNORMAL LOW (ref 12.0–15.0)
MCHC: 33.1 g/dL (ref 30.0–36.0)
RBC: 2.76 MIL/uL — ABNORMAL LOW (ref 3.87–5.11)
RDW: 15.6 % — ABNORMAL HIGH (ref 11.5–15.5)
WBC: 7.6 10*3/uL (ref 4.0–10.5)

## 2013-03-02 LAB — BASIC METABOLIC PANEL
BUN: 36 mg/dL — ABNORMAL HIGH (ref 6–23)
Calcium: 7.6 mg/dL — ABNORMAL LOW (ref 8.4–10.5)
Chloride: 103 mEq/L (ref 96–112)
Creatinine, Ser: 1.73 mg/dL — ABNORMAL HIGH (ref 0.50–1.10)
GFR calc Af Amer: 37 mL/min — ABNORMAL LOW (ref >90)
GFR calc non Af Amer: 32 mL/min — ABNORMAL LOW (ref >90)
Potassium: 3.5 mEq/L (ref 3.5–5.1)

## 2013-03-02 LAB — PROTEIN ELECTROPHORESIS, SERUM
Alpha-1-Globulin: 6.2 % — ABNORMAL HIGH (ref 2.9–4.9)
Alpha-2-Globulin: 21.3 % — ABNORMAL HIGH (ref 7.1–11.8)
Beta 2: 6.3 % (ref 3.2–6.5)
Gamma Globulin: 16.7 % (ref 11.1–18.8)

## 2013-03-02 LAB — COMPLEMENT, TOTAL: Compl, Total (CH50): 60 U/mL (ref 31–60)

## 2013-03-02 LAB — GLUCOSE, CAPILLARY: Glucose-Capillary: 192 mg/dL — ABNORMAL HIGH (ref 70–99)

## 2013-03-02 LAB — IMMUNOFIXATION ELECTROPHORESIS: Total Protein ELP: 5.2 g/dL — ABNORMAL LOW (ref 6.0–8.3)

## 2013-03-02 MED ORDER — INSULIN GLARGINE 100 UNIT/ML ~~LOC~~ SOLN
9.0000 [IU] | Freq: Once | SUBCUTANEOUS | Status: AC
  Administered 2013-03-02: 9 [IU] via SUBCUTANEOUS
  Filled 2013-03-02: qty 0.09

## 2013-03-02 MED ORDER — HYDROCODONE-ACETAMINOPHEN 5-325 MG PO TABS
1.0000 | ORAL_TABLET | ORAL | Status: DC | PRN
  Administered 2013-03-03 (×2): 2 via ORAL
  Administered 2013-03-04: 1 via ORAL
  Administered 2013-03-04 (×3): 2 via ORAL
  Administered 2013-03-05: 1 via ORAL
  Filled 2013-03-02 (×5): qty 2
  Filled 2013-03-02 (×2): qty 1

## 2013-03-02 MED ORDER — FUROSEMIDE 10 MG/ML IJ SOLN
40.0000 mg | Freq: Two times a day (BID) | INTRAMUSCULAR | Status: DC
  Administered 2013-03-02 – 2013-03-05 (×6): 40 mg via INTRAVENOUS
  Filled 2013-03-02 (×7): qty 4

## 2013-03-02 MED ORDER — DILTIAZEM HCL ER COATED BEADS 240 MG PO CP24
240.0000 mg | ORAL_CAPSULE | Freq: Every day | ORAL | Status: DC
  Administered 2013-03-02 – 2013-03-04 (×3): 240 mg via ORAL
  Filled 2013-03-02 (×3): qty 1

## 2013-03-02 MED ORDER — CLONIDINE HCL 0.1 MG PO TABS
0.1000 mg | ORAL_TABLET | Freq: Every day | ORAL | Status: DC
  Administered 2013-03-02 – 2013-03-05 (×4): 0.1 mg via ORAL
  Filled 2013-03-02 (×4): qty 1

## 2013-03-02 MED ORDER — DEXTROSE 5 % IV SOLN
1.5000 g | INTRAVENOUS | Status: AC
  Administered 2013-03-03: 07:00:00 1.5 g via INTRAVENOUS
  Filled 2013-03-02: qty 1.5

## 2013-03-02 NOTE — Progress Notes (Signed)
Patient's BP is 209/71, MD has ordered new BP medication; medication given; will continue to monitor.  Lorretta Harp RN

## 2013-03-02 NOTE — Progress Notes (Signed)
Chaplain entered the pt's room and observed that she was sitting in a chair.  Chaplain and pt discussed matters related to faith, courage and perseverance.  Pt expressed gratitude for chaplain's visit.   03/02/13 1200  Clinical Encounter Type  Visited With Patient  Visit Type Spiritual support  Spiritual Encounters  Spiritual Needs Emotional    Rulon Abide

## 2013-03-02 NOTE — Progress Notes (Signed)
Pt. Unable to wear CPAP mask at this time due to lines and tubing placement in neck.

## 2013-03-02 NOTE — Progress Notes (Signed)
[facility] PROGRESS NOTE Interim History: 57 year old female patient with history of congestive heart failure coronary artery disease as well as chronic kidney disease. She also has diabetes and chronic anemia related to her kidney disease. She presented to the hospital with complaints of shortness of breath and was found to have systolic blood pressure greater than 200. Chest x-ray was consistent with possible pulmonary edema. Due to the degree of respiratory failure she did require BiPAP in the emergency department. She was also temporarily started on IV nitroglycerin and IV Lasix was given with subsequent improvement in the patient's blood pressure and breathing therefore BiPAP was weaned off. Pt was doing quite well with diuresis but then started having SOB on Wenesday, her lasix was increase and started on zaroxolyn. Her SOB improved and edema along with JVD was resolving. Then again Sunday her JVD increase and edema worsen. Cardiology was consulted for RHC as was nephrology for ? Proteniuria. She was then dialysed and has improved significantly.   Assessment/Plan: Acute respiratory failure with hypoxia due to flash pulmonary edema due to malingnant HTN, Acute biventricular diastolic CHF (congestive heart failure)-stage 3/HTN (hypertension), malignant Status post RHC 10/21 which revealed elevated filling pressures with normal cardiac output. Remains on IV lasix infusion. Nephrology to change to bolus dosing. She is also status post dialysis. Is feeling better. Continue coreg, Hydralazine and Imdur. Cont zaroxolyn for now. Patient may require long term RRT in future. For vein grafting in AM. Was started on steroids for wheezing about 8 days ago and has been tapered. Will stop after today.  Chronic kidney disease (CKD), stage IV (severe)  As above. Vascular to place permanent access. Unclear if she will need long term dialysis at this time. Nephrology to monitor for another 1-2 days and then  determine.  Anemia, chronic disease/Acute blood loss anemia  Now stable. Hgb nadir 5.5. EGD revealed small multiple focal antral ulcers. PPI BID.  DM (diabetes mellitus)  On Lantus. Cont monitor CBG's. A1C was 7.7.  Accelerated HTN BP poorly controlled. Discussed with Nephrology. Will add Clonidine. Change to Cardizem from Nifedipine. PRN Labetalol.  Code Status: full Family Communication: none  Disposition Plan: Per nephrology. Hopefully over the weekend.   Consultants:  Cards  Nephrology  Procedures: ECHO 10.8.2014 Ejection fraction was 65%. Wall motion was normal; there were no regional wall motion abnormalities. Findings consistent with left ventricular diastolic dysfunction. Doppler parameters are consistent with high ventricular filling pressure  EGD 02/16/13 Scattered gastric ulcerations  Antibiotics:  None   HPI/Subjective: She feels better. No chest pain. No nausea or vomiting.  Objective: Filed Vitals:   03/01/13 1244 03/01/13 1847 03/01/13 2135 03/02/13 0530  BP: 214/67 173/86 184/64 190/72  Pulse: 65 70 69 72  Temp: 98.8 F (37.1 C)  99.1 F (37.3 C) 98.3 F (36.8 C)  TempSrc: Oral  Oral Oral  Resp: 18 18 18 18   Height:      Weight:    69.264 kg (152 lb 11.2 oz)  SpO2: 98% 100% 99% 96%    Filed Weights   03/01/13 0828 03/01/13 1221 03/02/13 0530  Weight: 75 kg (165 lb 5.5 oz) 69.6 kg (153 lb 7 oz) 69.264 kg (152 lb 11.2 oz)   10/22 0701 - 10/23 0700 In: 957.2 [P.O.:840; I.V.:117.2] Out: 5353 [Urine:1375]   Exam: General: Alert, awake, oriented x3, in no acute distress.  Heart: Regular rate and rhythm. Systolic murmur over apex. 1+ edema with scaling of her skin Lungs: diminished, no wheezing Abdomen: Soft,  nontender, nondistended, positive bowel sounds.   Data Reviewed: Basic Metabolic Panel:  Recent Labs Lab 02/26/13 0546 02/27/13 0631 02/28/13 0530 02/28/13 1530 03/01/13 0620 03/02/13 0430  NA 141 141 142  --  140 142  K 3.5  3.3* 3.8  --  3.8 3.5  CL 104 103 103  --  101 103  CO2 25 27 29   --  27 30  GLUCOSE 95 125* 91  --  283* 223*  BUN 81* 84* 89*  --  57* 36*  CREATININE 2.83* 2.85* 2.99* 3.02* 2.07* 1.73*  CALCIUM 8.1* 8.0* 7.9*  --  8.2* 7.6*  MG  --   --  1.8  --   --   --   PHOS  --   --  4.5  --   --   --    Liver Function Tests:  Recent Labs Lab 02/28/13 0530  AST 59*  ALT 43*  ALKPHOS 108  BILITOT 0.1*  PROT 5.4*  ALBUMIN 1.7*   CBC:  Recent Labs Lab 02/25/13 1110 02/28/13 0530 02/28/13 1530 03/01/13 0620 03/02/13 0430  WBC 13.0* 8.8 8.4 8.5 7.6  HGB 8.9* 7.8* 7.8* 8.5* 8.2*  HCT 26.7* 23.9* 23.3* 25.7* 24.8*  MCV 88.1 88.8 88.9 88.9 89.9  PLT 298 246 221 244 203   BNP (last 3 results)  Recent Labs  02/14/13 2333 02/22/13 0709 02/26/13 0546  PROBNP 8501.0* 5090.0* 7568.0*   CBG:  Recent Labs Lab 03/01/13 0624 03/01/13 1244 03/01/13 1555 03/01/13 2111 03/02/13 0613  GLUCAP 257* 153* 97 116* 208*    Studies: US Abdomen Complete  02/28/2013   CLINICAL DATA:  Acute kidney injury. Hypoalbuminemia and anasarca with ascites.  EXAM: ULTRASOUND ABDOMEN COMPLETE  COMPARISON:  02/15/2013  FINDINGS: Gallbladder  No gallstones or wall thickening visualized. No sonographic Murphy sign noted.  Common bile duct  Diameter: 8.6 mm. No duct stone is seen.  Liver  No focal lesion identified. Within normal limits in parenchymal echogenicity.  IVC  No abnormality visualized.  Pancreas  Visualized portion unremarkable.  Spleen  Spleen is borderline enlarged with a volume of 383 mL. No splenic mass or focal lesion.  Right Kidney  Length: 9.6 cm. Increased renal parenchymal echogenicity. No mass or hydronephrosis.  Left Kidney  Length: 11.7 cm. Increased renal parenchymal echogenicity. No mass or hydronephrosis.  Abdominal aorta  No aneurysm visualized.  Small amount of ascites is seen adjacent to the liver and spleen and in the perinephric spaces.  IMPRESSION: 1. Echogenic kidneys  consistent with medical renal disease. No hydronephrosis. 2. Borderline splenomegaly of unclear etiology. 3. Dilation of the common bile duct to 8.6 mm. No duct stone is seen. There is no pancreatic mass. This may be chronic. 4. Small amount of ascites. 5. No other abnormalities. No acute findings.   Electronically Signed   By: Amie Portland M.D.   On: 02/28/2013 12:12    Scheduled Meds: . aspirin EC  81 mg Oral Daily  . carvedilol  40 mg Oral Daily  . [START ON 03/03/2013] cefUROXime (ZINACEF)  IV  1.5 g Intravenous On Call to OR  . ferrous sulfate  325 mg Oral TID WC  . gabapentin  100 mg Oral BID  . heparin  5,000 Units Subcutaneous Q8H  . hydrALAZINE  100 mg Oral TID  . insulin aspart  0-5 Units Subcutaneous QHS  . insulin aspart  0-9 Units Subcutaneous TID WC  . insulin aspart  8 Units Subcutaneous TID WC  .  insulin glargine  18 Units Subcutaneous QHS  . insulin glargine  9 Units Subcutaneous Once  . isosorbide mononitrate  120 mg Oral Daily  . metolazone  5 mg Oral BID  . NIFEdipine  60 mg Oral Daily  . pantoprazole  40 mg Oral BID  . polyethylene glycol  17 g Oral Daily  . predniSONE  10 mg Oral Q breakfast  . sodium chloride  3 mL Intravenous Q12H  . sodium chloride  3 mL Intravenous Q12H   Continuous Infusions: . furosemide (LASIX) infusion 10 mg/hr (03/02/13 0430)     Shaka Zech  [facility] Pager [phone]  If 8PM-8AM, please contact night-coverage at [email or web address], password [facility] 03/02/2013, 10:48 AM  LOS: 16 days

## 2013-03-02 NOTE — Progress Notes (Addendum)
Advanced Heart Failure Rounding Note   Subjective:    RHC 02/28/13  with elevated filling pressures and normal cardiac output. Had HD the last 2 days. Weight down 52 pounds total. Lasix switched to bolus dosing.  Urine output good.  Vein Mapping completed. Plan for AVF Friday.   Denies dyspnea/ orthopnea. Feels much better.   Objective:   Weight Range:  Vital Signs:   Temp:  [98.3 F (36.8 C)-99.1 F (37.3 C)] 98.3 F (36.8 C) (10/23 0530) Pulse Rate:  [64-72] 72 (10/23 0530) Resp:  [14-18] 18 (10/23 0530) BP: (173-232)/(64-108) 190/72 mmHg (10/23 0530) SpO2:  [96 %-100 %] 96 % (10/23 0530) Weight:  [152 lb 11.2 oz (69.264 kg)-153 lb 7 oz (69.6 kg)] 152 lb 11.2 oz (69.264 kg) (10/23 0530) Last BM Date: 03/01/13  Weight change: Filed Weights   03/01/13 0828 03/01/13 1221 03/02/13 0530  Weight: 165 lb 5.5 oz (75 kg) 153 lb 7 oz (69.6 kg) 152 lb 11.2 oz (69.264 kg)    Intake/Output:   Intake/Output Summary (Last 24 hours) at 03/02/13 1028 Last data filed at 03/02/13 0834  Gross per 24 hour  Intake 837.17 ml  Output   5403 ml  Net -4565.83 ml     Physical Exam: General: . No resp difficulty Sitting in chair HEENT: normal Neck: supple. Trialysis cath RIJ. Carotids 2+ bilat; no bruits. No lymphadenopathy or thryomegaly appreciated. Cor: PMI nondisplaced. Regular rate & rhythm. No rubs, gallops or murmurs. Lungs: clear Abdomen: soft, nontender, nondistended. No hepatosplenomegaly. No bruits or masses. Good bowel sounds. Extremities: no cyanosis, clubbing, rash, R and LLE 2+ edema Neuro: alert & orientedx3, cranial nerves grossly intact. moves all 4 extremities w/o difficulty. Affect pleasant  Telemetry: SR 60s  Labs: Basic Metabolic Panel:  Recent Labs Lab 02/26/13 0546 02/27/13 0631 02/28/13 0530 02/28/13 1530 03/01/13 0620 03/02/13 0430  NA 141 141 142  --  140 142  K 3.5 3.3* 3.8  --  3.8 3.5  CL 104 103 103  --  101 103  CO2 25 27 29   --  27 30  GLUCOSE  95 125* 91  --  283* 223*  BUN 81* 84* 89*  --  57* 36*  CREATININE 2.83* 2.85* 2.99* 3.02* 2.07* 1.73*  CALCIUM 8.1* 8.0* 7.9*  --  8.2* 7.6*  MG  --   --  1.8  --   --   --   PHOS  --   --  4.5  --   --   --     Liver Function Tests:  Recent Labs Lab 02/28/13 0530  AST 59*  ALT 43*  ALKPHOS 108  BILITOT 0.1*  PROT 5.4*  ALBUMIN 1.7*   No results found for this basename: LIPASE, AMYLASE,  in the last 168 hours No results found for this basename: AMMONIA,  in the last 168 hours  CBC:  Recent Labs Lab 02/25/13 1110 02/28/13 0530 02/28/13 1530 03/01/13 0620 03/02/13 0430  WBC 13.0* 8.8 8.4 8.5 7.6  HGB 8.9* 7.8* 7.8* 8.5* 8.2*  HCT 26.7* 23.9* 23.3* 25.7* 24.8*  MCV 88.1 88.8 88.9 88.9 89.9  PLT 298 246 221 244 203    Cardiac Enzymes: No results found for this basename: CKTOTAL, CKMB, CKMBINDEX, TROPONINI,  in the last 168 hours  BNP: BNP (last 3 results)  Recent Labs  02/14/13 2333 02/22/13 0709 02/26/13 0546  PROBNP 8501.0* 5090.0* 7568.0*     Other results:    Imaging: US Abdomen Complete  02/28/2013   CLINICAL DATA:  Acute kidney injury. Hypoalbuminemia and anasarca with ascites.  EXAM: ULTRASOUND ABDOMEN COMPLETE  COMPARISON:  02/15/2013  FINDINGS: Gallbladder  No gallstones or wall thickening visualized. No sonographic Murphy sign noted.  Common bile duct  Diameter: 8.6 mm. No duct stone is seen.  Liver  No focal lesion identified. Within normal limits in parenchymal echogenicity.  IVC  No abnormality visualized.  Pancreas  Visualized portion unremarkable.  Spleen  Spleen is borderline enlarged with a volume of 383 mL. No splenic mass or focal lesion.  Right Kidney  Length: 9.6 cm. Increased renal parenchymal echogenicity. No mass or hydronephrosis.  Left Kidney  Length: 11.7 cm. Increased renal parenchymal echogenicity. No mass or hydronephrosis.  Abdominal aorta  No aneurysm visualized.  Small amount of ascites is seen adjacent to the liver and  spleen and in the perinephric spaces.  IMPRESSION: 1. Echogenic kidneys consistent with medical renal disease. No hydronephrosis. 2. Borderline splenomegaly of unclear etiology. 3. Dilation of the common bile duct to 8.6 mm. No duct stone is seen. There is no pancreatic mass. This may be chronic. 4. Small amount of ascites. 5. No other abnormalities. No acute findings.   Electronically Signed   By: Amie Portland M.D.   On: 02/28/2013 12:12     Medications:     Scheduled Medications: . aspirin EC  81 mg Oral Daily  . carvedilol  40 mg Oral Daily  . [START ON 03/03/2013] cefUROXime (ZINACEF)  IV  1.5 g Intravenous On Call to OR  . ferrous sulfate  325 mg Oral TID WC  . gabapentin  100 mg Oral BID  . heparin  5,000 Units Subcutaneous Q8H  . hydrALAZINE  100 mg Oral TID  . insulin aspart  0-5 Units Subcutaneous QHS  . insulin aspart  0-9 Units Subcutaneous TID WC  . insulin aspart  8 Units Subcutaneous TID WC  . insulin glargine  18 Units Subcutaneous QHS  . insulin glargine  9 Units Subcutaneous Once  . isosorbide mononitrate  120 mg Oral Daily  . metolazone  5 mg Oral BID  . NIFEdipine  60 mg Oral Daily  . pantoprazole  40 mg Oral BID  . polyethylene glycol  17 g Oral Daily  . predniSONE  10 mg Oral Q breakfast  . sodium chloride  3 mL Intravenous Q12H  . sodium chloride  3 mL Intravenous Q12H    Infusions: . furosemide (LASIX) infusion 10 mg/hr (03/02/13 0430)    PRN Medications: sodium chloride, acetaminophen, acetaminophen, acetaminophen, albuterol, HYDROcodone-acetaminophen, labetalol, ondansetron (ZOFRAN) IV, ondansetron (ZOFRAN) IV, ondansetron, sodium chloride   Assessment:   1. 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 Pulmonary venous hypertension  3. Acute on chronic kidney disease, IV-V  4. Persistent hypertension  5. Acute on chronic anemia with iron deficiency  s/p PRBC/IV iron  6. Severe hypoalbuminemia with proteinuria  7. Gastric ulcer disease  8. OSA, noncompliant with CPAP prior to admission  9. COPD  10. Diabetes mellitus    Plan/Discussion:    Volume status much improved with HD and IV lasix.   Plan for AVF and perm cath Friday.   Otherwise plan per Renal.    Length of Stay: 16   CLEGG,AMY NP-C 03/02/2013, 10:28 AM  Advanced Heart Failure Team Pager [phone] (M-F; 7a - 4p)  Please contact [facility] for night-coverage after hours (4p -7a )  and weekends on [email or web address]  Patient seen and examined with Tonye Becket, NP. We discussed all aspects of the encounter. I agree with the assessment and plan as stated above.   Much improved with HD and IV lasix. Permanent access being placed tomorrow. Plan per renal. We will follow at a distance as their is not much for [place] to do currently.   Truman Hayward 3:49 PM

## 2013-03-02 NOTE — Progress Notes (Signed)
Patient ID: Madeline Bad, female   DOB: 12/12/1955, 57 y.o.   MRN: [id number] S:feels better O:BP 190/72  Pulse 72  Temp(Src) 98.3 F (36.8 C) (Oral)  Resp 18  Ht 4\' 9"  (1.448 m)  Wt 69.264 kg (152 lb 11.2 oz)  BMI 33.03 kg/m2  SpO2 96%  Intake/Output Summary (Last 24 hours) at 03/02/13 1055 Last data filed at 03/02/13 0834  Gross per 24 hour  Intake 837.17 ml  Output   5403 ml  Net -4565.83 ml   Intake/Output: I/O last 3 completed shifts: In: 1207.2 [P.O.:840; I.V.:367.2] Out: 9534 [Urine:2555; WUJWJ:1914; Stool:1]  Intake/Output this shift:  Total I/O In: 240 [P.O.:240] Out: 450 [Urine:450] Weight change: -5.1 kg (-11 lb 3.9 oz) Gen:wd  CVS:no rub Resp:cta NWG:NFAOZH Ext:1+edema   Recent Labs Lab 02/24/13 0430 02/25/13 0445 02/26/13 0546 02/27/13 0631 02/28/13 0530 02/28/13 1530 03/01/13 0620 03/02/13 0430  NA 139 141 141 141 142  --  140 142  K 4.1 3.8 3.5 3.3* 3.8  --  3.8 3.5  CL 103 105 104 103 103  --  101 103  CO2 25 21 25 27 29   --  27 30  GLUCOSE 110* 194* 95 125* 91  --  283* 223*  BUN 68* 77* 81* 84* 89*  --  57* 36*  CREATININE 2.67* 2.93* 2.83* 2.85* 2.99* 3.02* 2.07* 1.73*  ALBUMIN  --   --   --   --  1.7*  --   --   --   CALCIUM 8.2* 8.1* 8.1* 8.0* 7.9*  --  8.2* 7.6*  PHOS  --   --   --   --  4.5  --   --   --   AST  --   --   --   --  59*  --   --   --   ALT  --   --   --   --  43*  --   --   --    Liver Function Tests:  Recent Labs Lab 02/28/13 0530  AST 59*  ALT 43*  ALKPHOS 108  BILITOT 0.1*  PROT 5.4*  ALBUMIN 1.7*   No results found for this basename: LIPASE, AMYLASE,  in the last 168 hours No results found for this basename: AMMONIA,  in the last 168 hours CBC:  Recent Labs Lab 02/25/13 1110 02/28/13 0530 02/28/13 1530 03/01/13 0620 03/02/13 0430  WBC 13.0* 8.8 8.4 8.5 7.6  HGB 8.9* 7.8* 7.8* 8.5* 8.2*  HCT 26.7* 23.9* 23.3* 25.7* 24.8*  MCV 88.1 88.8 88.9 88.9 89.9  PLT 298 246 221 244 203   Cardiac  Enzymes: No results found for this basename: CKTOTAL, CKMB, CKMBINDEX, TROPONINI,  in the last 168 hours CBG:  Recent Labs Lab 03/01/13 0624 03/01/13 1244 03/01/13 1555 03/01/13 2111 03/02/13 0613  GLUCAP 257* 153* 97 116* 208*    Iron Studies:  Recent Labs  02/28/13 0530  IRON 56  TIBC 187*   Studies/Results: US Abdomen Complete  02/28/2013   CLINICAL DATA:  Acute kidney injury. Hypoalbuminemia and anasarca with ascites.  EXAM: ULTRASOUND ABDOMEN COMPLETE  COMPARISON:  02/15/2013  FINDINGS: Gallbladder  No gallstones or wall thickening visualized. No sonographic Murphy sign noted.  Common bile duct  Diameter: 8.6 mm. No duct stone is seen.  Liver  No focal lesion identified. Within normal limits in parenchymal echogenicity.  IVC  No abnormality visualized.  Pancreas  Visualized portion unremarkable.  Spleen  Spleen is borderline  enlarged with [name] volume of 383 mL. No splenic mass or focal lesion.  Right Kidney  Length: 9.6 cm. Increased renal parenchymal echogenicity. No mass or hydronephrosis.  Left Kidney  Length: 11.7 cm. Increased renal parenchymal echogenicity. No mass or hydronephrosis.  Abdominal aorta  No aneurysm visualized.  Small amount of ascites is seen adjacent to the liver and spleen and in the perinephric spaces.  IMPRESSION: 1. Echogenic kidneys consistent with medical renal disease. No hydronephrosis. 2. Borderline splenomegaly of unclear etiology. 3. Dilation of the common bile duct to 8.6 mm. No duct stone is seen. There is no pancreatic mass. This may be chronic. 4. Small amount of ascites. 5. No other abnormalities. No acute findings.   Electronically Signed   By: Amie Portland M.D.   On: 02/28/2013 12:12   . aspirin EC  81 mg Oral Daily  . carvedilol  40 mg Oral Daily  . [START ON 03/03/2013] cefUROXime (ZINACEF)  IV  1.5 g Intravenous On Call to OR  . ferrous sulfate  325 mg Oral TID WC  . gabapentin  100 mg Oral BID  . heparin  5,000 Units Subcutaneous Q8H  .  hydrALAZINE  100 mg Oral TID  . insulin aspart  0-5 Units Subcutaneous QHS  . insulin aspart  0-9 Units Subcutaneous TID WC  . insulin aspart  8 Units Subcutaneous TID WC  . insulin glargine  18 Units Subcutaneous QHS  . insulin glargine  9 Units Subcutaneous Once  . isosorbide mononitrate  120 mg Oral Daily  . metolazone  5 mg Oral BID  . NIFEdipine  60 mg Oral Daily  . pantoprazole  40 mg Oral BID  . polyethylene glycol  17 g Oral Daily  . predniSONE  10 mg Oral Q breakfast  . sodium chloride  3 mL Intravenous Q12H  . sodium chloride  3 mL Intravenous Q12H    BMET    Component Value Date/Time   NA 142 03/02/2013 0430   K 3.5 03/02/2013 0430   CL 103 03/02/2013 0430   CO2 30 03/02/2013 0430   GLUCOSE 223* 03/02/2013 0430   BUN 36* 03/02/2013 0430   CREATININE 1.73* 03/02/2013 0430   CALCIUM 7.6* 03/02/2013 0430   GFRNONAA 32* 03/02/2013 0430   GFRAA 37* 03/02/2013 0430   CBC    Component Value Date/Time   WBC 7.6 03/02/2013 0430   RBC 2.76* 03/02/2013 0430   RBC 2.79* 02/18/2013 1345   HGB 8.2* 03/02/2013 0430   HCT 24.8* 03/02/2013 0430   PLT 203 03/02/2013 0430   MCV 89.9 03/02/2013 0430   MCH 29.7 03/02/2013 0430   MCHC 33.1 03/02/2013 0430   RDW 15.6* 03/02/2013 0430   LYMPHSABS 1.1 02/14/2013 1853   MONOABS 0.6 02/14/2013 1853   EOSABS 0.1 02/14/2013 1853   BASOSABS 0.0 02/14/2013 1853     Assessment/Plan:  1. CHF- preserved CO as seen by R-heart cath yesterday. Markedly improved volume following HD with UF yesterday. Feels better. Plan for HD again today with more UF. Hopefully, the improved volume status will help her cardiorenal syndrome and will not require longterm RRT, although she has had progressive CKD over the last year and [name] half. Will need to proceed with placement of permanent access. 2. Proteinuria- likely related to longstanding, poorly controlled DM and would not change therapy even if she had nephrotic range proteinuria. Would not add ACE/ARB  given her multiple episodes of AKI/CKD related to right-sided HF and diuresis. We will quantify  and check other serologies. Cont with diuresis for now and plan for HD today and again tomorrow. 3. AKI/CKD- s/p 2 sessions of HD with marked improvement. Will stop lasix gtt and change to IV 40mg  bid and follow UOP and Scr.  Hold off on HD for now. Not certain if this will be permanent or temporary, however given her advanced CKD, will likely need it longterm at some point. Discussed the ways to delay the progression of CKD with  1. Tight BP control with goal BP <130/80 2. Tight glucose control with goal Hgb A1c <7% 3. Use of an ace/arb (would not in her case given frequent episodes of AKI/CKD) 4. Avoidance of nephrotoxic agents such as NSAIDs/COX-II I's and IV contrast 4. OSA/pulm HTN- plan for HD with UF and would strongly recommend resumption of CPAP 5. HTN- not optimal. Would recommend diltiazem CD 240mg  rather than nifidipine for longer acting CCB and antiproteinuric effects. No response with UF on HD.  Will also add clonidine 0.1mg  qhs to regimen.   1. Will check renin/aldo level as well given possible R RAS although may want to image for possible intervention.   6. ACDz- as above. 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 DM- poorly controlled. Plan per primary svc 9. Hypoalbuminemia- again possibly related to diabetic nephropathy but also on DDx is fatty liver disease or cardiac cirrhosis. 10. Anasarca/ascites- as above 11. CAD s/p NSTEMI 12. Pulm HTN- recommend CPAP compliance 13. Vascular access- appreciate Dr. Jones Broom for placing temp dialysis catheter. Plan for AVF/AVG placement tomorrow by Dr. Arbie Cookey as well as tunneled HD catheter.   14.   Ramandeep Arington [name]

## 2013-03-02 NOTE — Progress Notes (Addendum)
VVS Progress Note:  Plan Left arm AVF vs graft Friday AM.   VM shows Left BVT may be adequate size for fistula. All other veins are small  Plan: Left arm AVF vs graft and placement of catheter Friday am Please do not dialyze in AM prior to surgery  Agree with above Vein mapping reviewed and it appears that left basilic vein transposition may be only option for AV fistula Plan attempt at AV fistula left upper extremity possible left arm AV graft plus tunneled catheter in a.m. by Dr. early  Please be certain that patient is not on hemodialysis Friday a.m. 

## 2013-03-02 NOTE — Progress Notes (Signed)
PT Cancellation Note  Patient Details Name: Moneisha Vosler MRN: [id number] DOB: [birth year]   Cancelled Treatment:    Reason Eval/Treat Not Completed: Fatigue/lethargy limiting ability to participate;Other (comment) (Pt declined all aspects of therapy including gait & ther ex) Pt reports feeling bad since cath placement.  Will check back as able.   Lynard Postlewait LUBECK 03/02/2013, 10:57 AM

## 2013-03-02 NOTE — Progress Notes (Signed)
Inpatient Diabetes Program Recommendations  AACE/ADA: New Consensus Statement on Inpatient Glycemic Control (2013)  Target Ranges:  Prepandial:   less than 140 mg/dL      Peak postprandial:   less than 180 mg/dL (1-2 hours)      Critically ill patients:  140 - 180 mg/dL  Results for Madeline [name], Madeline [name] (MRN [id number]) as of 03/02/2013 10:44  Ref. Range 02/28/2013 06:03 02/28/2013 12:50 02/28/2013 16:37 02/28/2013 20:53 03/01/2013 06:24  Glucose-Capillary Latest Range: 70-99 mg/dL 85 664 (H) 403 (H) 474 (H) 257 (H)   Results for Madeline [name], Madeline [name] (MRN [id number]) as of 03/02/2013 10:44  Ref. Range 03/01/2013 06:24 03/01/2013 12:44 03/01/2013 15:55 03/01/2013 21:11 03/02/2013 06:13  Glucose-Capillary Latest Range: 70-99 mg/dL 643 (H) 329 (H) 97 518 (H) 208 (H)    Inpatient Diabetes Program Recommendations Insulin - Basal: Please consider decreasing Lantus QHS order to 15 units QHS.   Steroids have been significantly reduced over the past few days.  Question Somogyi rebound hyperglycemia over the past two mornings as bedtime glucose was 102 mg/dl on 84/16, fasting 606 mg/dl on 30/16, bedtime 010 mg/dl on 93/23 , and fasting 208 mg/dl this morning.   Note: Note that Lantus 9 units at bedtime tonight has been ordered (one time dose - preop) since patient will be NPO after midnight for surgical procedure on Friday.  Also noted that standing Lantus 18 units QHS is still ordered also.  Please consider decreasing Lantus 18 units QHS order to Lantus 15 units QHS.  Steroids have been significantly reduced over the past few days and question Somogyi rebound hyperglycemia with normal bedtime glucoses and elevated fasting glucose levels over the past 2 days.  Will continue to follow.  Thanks, Orlando Penner, RN, MSN, CCRN Diabetes Coordinator Inpatient Diabetes Program [phone] (Team Pager) [phone] ([facility] office) [phone] [facility] office)

## 2013-03-03 ENCOUNTER — Encounter (HOSPITAL_COMMUNITY): Payer: Self-pay | Admitting: Anesthesiology

## 2013-03-03 ENCOUNTER — Inpatient Hospital Stay (HOSPITAL_COMMUNITY): Payer: Medicaid Other

## 2013-03-03 ENCOUNTER — Encounter (HOSPITAL_COMMUNITY)
Admission: EM | Disposition: A | Discharge status: Home Health | Payer: Self-pay | Source: Home / Self Care | Attending: Internal Medicine

## 2013-03-03 ENCOUNTER — Encounter (HOSPITAL_COMMUNITY): Payer: Medicaid Other | Admitting: Anesthesiology

## 2013-03-03 ENCOUNTER — Inpatient Hospital Stay (HOSPITAL_COMMUNITY): Payer: Medicaid Other | Admitting: Anesthesiology

## 2013-03-03 DIAGNOSIS — N189 Chronic kidney disease, unspecified: Secondary | ICD-10-CM

## 2013-03-03 HISTORY — PX: INSERTION OF DIALYSIS CATHETER: SHX1324

## 2013-03-03 HISTORY — PX: AV FISTULA PLACEMENT: SHX1204

## 2013-03-03 LAB — RENAL FUNCTION PANEL
Albumin: 1.8 g/dL — ABNORMAL LOW (ref 3.5–5.2)
CO2: 31 mEq/L (ref 19–32)
Chloride: 102 mEq/L (ref 96–112)
GFR calc Af Amer: 26 mL/min — ABNORMAL LOW (ref >90)
GFR calc non Af Amer: 23 mL/min — ABNORMAL LOW (ref >90)
Glucose, Bld: 115 mg/dL — ABNORMAL HIGH (ref 70–99)
Sodium: 142 mEq/L (ref 135–145)

## 2013-03-03 LAB — CBC
MCHC: 32.2 g/dL (ref 30.0–36.0)
MCV: 89.5 fL (ref 78.0–100.0)
Platelets: 201 10*3/uL (ref 150–400)
RDW: 15.4 % (ref 11.5–15.5)
WBC: 7.9 10*3/uL (ref 4.0–10.5)

## 2013-03-03 LAB — GLUCOSE, CAPILLARY
Glucose-Capillary: 111 mg/dL — ABNORMAL HIGH (ref 70–99)
Glucose-Capillary: 144 mg/dL — ABNORMAL HIGH (ref 70–99)
Glucose-Capillary: 147 mg/dL — ABNORMAL HIGH (ref 70–99)

## 2013-03-03 SURGERY — INSERTION OF DIALYSIS CATHETER
Anesthesia: Monitor Anesthesia Care | Site: Arm Upper | Laterality: Right | Wound class: Clean

## 2013-03-03 MED ORDER — OXYCODONE HCL 5 MG PO TABS: 5.0000 mg | ORAL_TABLET | Freq: Once | ORAL | Status: DC | PRN

## 2013-03-03 MED ORDER — MORPHINE SULFATE 2 MG/ML IJ SOLN
1.0000 mg | INTRAMUSCULAR | Status: DC | PRN
  Administered 2013-03-03 – 2013-03-04 (×4): 1 mg via INTRAVENOUS
  Filled 2013-03-03 (×4): qty 1

## 2013-03-03 MED ORDER — MEPERIDINE HCL 25 MG/ML IJ SOLN: 6.2500 mg | INTRAMUSCULAR | Status: DC | PRN

## 2013-03-03 MED ORDER — PROPOFOL 10 MG/ML IV BOLUS
INTRAVENOUS | Status: DC | PRN
  Administered 2013-03-03: 50 mg via INTRAVENOUS
  Administered 2013-03-03: 150 mg via INTRAVENOUS

## 2013-03-03 MED ORDER — SODIUM CHLORIDE 0.9 % IR SOLN
Status: DC | PRN
  Administered 2013-03-03: 11:00:00

## 2013-03-03 MED ORDER — HEPARIN SODIUM (PORCINE) 1000 UNIT/ML IJ SOLN
INTRAMUSCULAR | Status: DC | PRN
  Administered 2013-03-03: 1000 [IU]

## 2013-03-03 MED ORDER — MIDAZOLAM HCL 5 MG/5ML IJ SOLN: INTRAMUSCULAR | Status: DC | PRN

## 2013-03-03 MED ORDER — LIDOCAINE HCL (CARDIAC) 20 MG/ML IV SOLN
INTRAVENOUS | Status: DC | PRN
  Administered 2013-03-03: 60 mg via INTRAVENOUS

## 2013-03-03 MED ORDER — MIDAZOLAM HCL 5 MG/5ML IJ SOLN
INTRAMUSCULAR | Status: DC | PRN
  Administered 2013-03-03: 2 mg via INTRAVENOUS

## 2013-03-03 MED ORDER — HEPARIN SODIUM (PORCINE) 1000 UNIT/ML IJ SOLN
INTRAMUSCULAR | Status: AC
  Filled 2013-03-03: qty 1

## 2013-03-03 MED ORDER — 0.9 % SODIUM CHLORIDE (POUR BTL) OPTIME
TOPICAL | Status: DC | PRN
  Administered 2013-03-03: 1000 mL

## 2013-03-03 MED ORDER — INSULIN GLARGINE 100 UNIT/ML ~~LOC~~ SOLN
20.0000 [IU] | Freq: Every day | SUBCUTANEOUS | Status: DC
  Filled 2013-03-03 (×3): qty 0.2

## 2013-03-03 MED ORDER — ONDANSETRON HCL 4 MG/2ML IJ SOLN
INTRAMUSCULAR | Status: DC | PRN
  Administered 2013-03-03: 4 mg via INTRAVENOUS

## 2013-03-03 MED ORDER — ARTIFICIAL TEARS OP OINT
TOPICAL_OINTMENT | OPHTHALMIC | Status: DC | PRN
  Administered 2013-03-03: 1 via OPHTHALMIC

## 2013-03-03 MED ORDER — FENTANYL CITRATE 0.05 MG/ML IJ SOLN: 25.0000 ug | INTRAMUSCULAR | Status: DC | PRN

## 2013-03-03 MED ORDER — SUCCINYLCHOLINE CHLORIDE 20 MG/ML IJ SOLN
INTRAMUSCULAR | Status: DC | PRN
  Administered 2013-03-03: 100 mg via INTRAVENOUS

## 2013-03-03 MED ORDER — MIDAZOLAM HCL 2 MG/2ML IJ SOLN: 0.5000 mg | Freq: Once | INTRAMUSCULAR | Status: DC | PRN

## 2013-03-03 MED ORDER — OXYCODONE HCL 5 MG/5ML PO SOLN: 5.0000 mg | Freq: Once | ORAL | Status: DC | PRN

## 2013-03-03 MED ORDER — FENTANYL CITRATE 0.05 MG/ML IJ SOLN
INTRAMUSCULAR | Status: DC | PRN
  Administered 2013-03-03 (×3): 50 ug via INTRAVENOUS
  Administered 2013-03-03: 100 ug via INTRAVENOUS

## 2013-03-03 MED ORDER — PROMETHAZINE HCL 25 MG/ML IJ SOLN: 6.2500 mg | INTRAMUSCULAR | Status: DC | PRN

## 2013-03-03 SURGICAL SUPPLY — 62 items
BAG DECANTER FOR FLEXI CONT (MISCELLANEOUS) ×3 IMPLANT
BENZOIN TINCTURE PRP APPL 2/3 (GAUZE/BANDAGES/DRESSINGS) ×3 IMPLANT
CANISTER SUCTION 2500CC (MISCELLANEOUS) ×3 IMPLANT
CATH CANNON HEMO 15F 50CM (CATHETERS) IMPLANT
CATH CANNON HEMO 15FR 19 (HEMODIALYSIS SUPPLIES) IMPLANT
CATH CANNON HEMO 15FR 23CM (HEMODIALYSIS SUPPLIES) ×3 IMPLANT
CATH CANNON HEMO 15FR 31CM (HEMODIALYSIS SUPPLIES) IMPLANT
CATH CANNON HEMO 15FR 32CM (HEMODIALYSIS SUPPLIES) IMPLANT
CLIP LIGATING EXTRA MED SLVR (CLIP) ×3 IMPLANT
CLIP LIGATING EXTRA SM BLUE (MISCELLANEOUS) ×3 IMPLANT
COVER PROBE W GEL 5X96 (DRAPES) IMPLANT
COVER SURGICAL LIGHT HANDLE (MISCELLANEOUS) ×3 IMPLANT
DECANTER SPIKE VIAL GLASS SM (MISCELLANEOUS) IMPLANT
DERMABOND ADVANCED (GAUZE/BANDAGES/DRESSINGS) ×1
DERMABOND ADVANCED .7 DNX12 (GAUZE/BANDAGES/DRESSINGS) ×2 IMPLANT
DRAPE C-ARM 42X72 X-RAY (DRAPES) ×3 IMPLANT
DRAPE CHEST BREAST 15X10 FENES (DRAPES) ×3 IMPLANT
ELECT REM PT RETURN 9FT ADLT (ELECTROSURGICAL) ×3
ELECTRODE REM PT RTRN 9FT ADLT (ELECTROSURGICAL) ×2 IMPLANT
GAUZE SPONGE 2X2 8PLY STRL LF (GAUZE/BANDAGES/DRESSINGS) ×2 IMPLANT
GAUZE SPONGE 4X4 16PLY XRAY LF (GAUZE/BANDAGES/DRESSINGS) ×3 IMPLANT
GEL ULTRASOUND 20GR AQUASONIC (MISCELLANEOUS) IMPLANT
GLOVE BIOGEL PI IND STRL 6.5 (GLOVE) ×2 IMPLANT
GLOVE BIOGEL PI IND STRL 7.0 (GLOVE) ×8 IMPLANT
GLOVE BIOGEL PI INDICATOR 6.5 (GLOVE) ×1
GLOVE BIOGEL PI INDICATOR 7.0 (GLOVE) ×4
GLOVE SS BIOGEL STRL SZ 6.5 (GLOVE) ×2 IMPLANT
GLOVE SS BIOGEL STRL SZ 7.5 (GLOVE) ×2 IMPLANT
GLOVE SUPERSENSE BIOGEL SZ 6.5 (GLOVE) ×1
GLOVE SUPERSENSE BIOGEL SZ 7.5 (GLOVE) ×1
GLOVE SURG SS PI 7.0 STRL IVOR (GLOVE) ×9 IMPLANT
GOWN STRL NON-REIN LRG LVL3 (GOWN DISPOSABLE) ×9 IMPLANT
GOWN STRL REIN XL XLG (GOWN DISPOSABLE) ×6 IMPLANT
KIT BASIN OR (CUSTOM PROCEDURE TRAY) ×3 IMPLANT
KIT ROOM TURNOVER OR (KITS) ×3 IMPLANT
NEEDLE 18GX1X1/2 (RX/OR ONLY) (NEEDLE) ×3 IMPLANT
NEEDLE 22X1 1/2 (OR ONLY) (NEEDLE) ×3 IMPLANT
NEEDLE HYPO 25GX1X1/2 BEV (NEEDLE) ×3 IMPLANT
NS IRRIG 1000ML POUR BTL (IV SOLUTION) ×3 IMPLANT
PACK CV ACCESS (CUSTOM PROCEDURE TRAY) ×3 IMPLANT
PACK SURGICAL SETUP 50X90 (CUSTOM PROCEDURE TRAY) IMPLANT
PAD ARMBOARD 7.5X6 YLW CONV (MISCELLANEOUS) ×6 IMPLANT
SOAP 2 % CHG 4 OZ (WOUND CARE) ×3 IMPLANT
SPONGE GAUZE 2X2 STER 10/PKG (GAUZE/BANDAGES/DRESSINGS) ×1
SPONGE GAUZE 4X4 12PLY (GAUZE/BANDAGES/DRESSINGS) ×3 IMPLANT
STRIP CLOSURE SKIN 1/2X4 (GAUZE/BANDAGES/DRESSINGS) ×3 IMPLANT
SUT ETHILON 3 0 PS 1 (SUTURE) ×3 IMPLANT
SUT PROLENE 6 0 CC (SUTURE) ×3 IMPLANT
SUT SILK 2 0 SH (SUTURE) IMPLANT
SUT VIC AB 3-0 SH 27 (SUTURE) ×1
SUT VIC AB 3-0 SH 27X BRD (SUTURE) ×2 IMPLANT
SUT VICRYL 4-0 PS2 18IN ABS (SUTURE) ×3 IMPLANT
SYR 20CC LL (SYRINGE) ×3 IMPLANT
SYR 30ML LL (SYRINGE) IMPLANT
SYR 5ML LL (SYRINGE) ×6 IMPLANT
SYR CONTROL 10ML LL (SYRINGE) IMPLANT
SYRINGE 10CC LL (SYRINGE) ×3 IMPLANT
TAPE CLOTH SURG 4X10 WHT LF (GAUZE/BANDAGES/DRESSINGS) ×6 IMPLANT
TOWEL OR 17X24 6PK STRL BLUE (TOWEL DISPOSABLE) ×6 IMPLANT
TOWEL OR 17X26 10 PK STRL BLUE (TOWEL DISPOSABLE) ×6 IMPLANT
UNDERPAD 30X30 INCONTINENT (UNDERPADS AND DIAPERS) ×3 IMPLANT
WATER STERILE IRR 1000ML POUR (IV SOLUTION) ×3 IMPLANT

## 2013-03-03 NOTE — Progress Notes (Signed)
[facility] PROGRESS NOTE Interim History: 57 year old female patient with history of congestive heart failure coronary artery disease as well as chronic kidney disease. She also has diabetes and chronic anemia related to her kidney disease. She presented to the hospital with complaints of shortness of breath and was found to have systolic blood pressure greater than 200. Chest x-ray was consistent with possible pulmonary edema. Due to the degree of respiratory failure she did require BiPAP in the emergency department. She was also temporarily started on IV nitroglycerin and IV Lasix was given with subsequent improvement in the patient's blood pressure and breathing therefore BiPAP was weaned off. Pt was doing quite well with diuresis but then started having SOB on Wenesday, her lasix was increase and started on zaroxolyn. Her SOB improved and edema along with JVD was resolving. Then again Sunday her JVD increase and edema worsen. Cardiology was consulted for RHC as was nephrology for ? Proteniuria. She was then dialysed and has improved significantly.   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 She is status post RHC 10/21 which revealed elevated filling pressures with normal cardiac output. Was on lasix infusion which was changed 10/23. Continue coreg, Hydralazine and Imdur. Cont zaroxolyn for now. Patient may require long term RRT in future. For vein grafting and permanent catheter today. Was started on steroids for wheezing about 8 days ago and has been tapered to off.   Chronic kidney disease (CKD), stage IV (severe)  As above. Vascular to place permanent access. Unclear if she will need long term dialysis at this time. Nephrology to monitor for another 1-2 days and then determine.  Anemia, chronic  disease/Acute blood loss anemia  Now stable. Hgb nadir 5.5. EGD revealed small multiple focal antral ulcers. PPI BID.  DM (diabetes mellitus)  Lantus dose adjusted today. Cont monitor CBG's. A1C was 7.7.  Accelerated HTN BP is better after medication adjustments. Monitor. Appreciate Nephrology assistance with same. Clonidine was added and changed to Cardizem from Nifedipine. PRN Labetalol.  Code Status: full Family Communication: Discussed with patient. Disposition Plan: Per nephrology. Hopefully over the weekend.   Consultants:  Cards  Nephrology  Vascular  Procedures: ECHO 10.8.2014 Ejection fraction was 65%. Wall motion was normal; there were no regional wall motion abnormalities. Findings consistent with left ventricular diastolic dysfunction. Doppler parameters are consistent with high ventricular filling pressure  EGD 02/16/13 Scattered gastric ulcerations  Temporary dialysis catheter placement 10/21  Graft/Fistula placement and Permanent dialysis catheter placement scheduled for 10/24  Antibiotics:  None   HPI/Subjective: She feels better. No chest pain. No nausea or vomiting.  Objective: Filed Vitals:   03/02/13 2005 03/02/13 2341 03/03/13 0557 03/03/13 0754  BP: 154/53  171/63 170/60  Pulse: 63 60 54 53  Temp: 99.4 F (37.4 C)  98.2 F (36.8 C)   TempSrc: Oral  Oral   Resp: 18 20 18    Height:      Weight:   68.584 kg (151 lb 3.2 oz)   SpO2: 98% 96% 100%     Filed Weights   03/01/13 1221 03/02/13 0530 03/03/13 0557  Weight: 69.6 kg (153 lb 7 oz) 69.264 kg (152 lb 11.2 oz) 68.584 kg (151 lb 3.2 oz)   10/23 0701 - 10/24 0700 In: 1182.8 [P.O.:1020; I.V.:162.8] Out: 1550 [Urine:1550]  Neg 27 ltrs this admission  Exam: General: Alert, awake, oriented x3, in  no acute distress.  Heart: Regular rate and rhythm. Systolic murmur over apex. 1+ edema with scaling of her skin. Lungs: diminished, no wheezing Abdomen: Soft, nontender, nondistended,  positive bowel sounds.   Data Reviewed: Basic Metabolic Panel:  Recent Labs Lab 02/26/13 0546 02/27/13 0631 02/28/13 0530 02/28/13 1530 03/01/13 0620 03/02/13 0430  NA 141 141 142  --  140 142  K 3.5 3.3* 3.8  --  3.8 3.5  CL 104 103 103  --  101 103  CO2 25 27 29   --  27 30  GLUCOSE 95 125* 91  --  283* 223*  BUN 81* 84* 89*  --  57* 36*  CREATININE 2.83* 2.85* 2.99* 3.02* 2.07* 1.73*  CALCIUM 8.1* 8.0* 7.9*  --  8.2* 7.6*  MG  --   --  1.8  --   --   --   PHOS  --   --  4.5  --   --   --    Liver Function Tests:  Recent Labs Lab 02/28/13 0530  AST 59*  ALT 43*  ALKPHOS 108  BILITOT 0.1*  PROT 5.4*  ALBUMIN 1.7*   CBC:  Recent Labs Lab 02/25/13 1110 02/28/13 0530 02/28/13 1530 03/01/13 0620 03/02/13 0430  WBC 13.0* 8.8 8.4 8.5 7.6  HGB 8.9* 7.8* 7.8* 8.5* 8.2*  HCT 26.7* 23.9* 23.3* 25.7* 24.8*  MCV 88.1 88.8 88.9 88.9 89.9  PLT 298 246 221 244 203   BNP (last 3 results)  Recent Labs  02/14/13 2333 02/22/13 0709 02/26/13 0546  PROBNP 8501.0* 5090.0* 7568.0*   CBG:  Recent Labs Lab 03/02/13 0613 03/02/13 1110 03/02/13 1608 03/02/13 2104 03/03/13 0615  GLUCAP 208* 95 192* 365* 111*    Studies: No results found.  Scheduled Meds: . aspirin EC  81 mg Oral Daily  . carvedilol  40 mg Oral Daily  . cefUROXime (ZINACEF)  IV  1.5 g Intravenous On Call to OR  . cloNIDine  0.1 mg Oral Daily  . diltiazem  240 mg Oral Daily  . ferrous sulfate  325 mg Oral TID WC  . furosemide  40 mg Intravenous BID  . gabapentin  100 mg Oral BID  . heparin  5,000 Units Subcutaneous Q8H  . hydrALAZINE  100 mg Oral TID  . insulin aspart  0-5 Units Subcutaneous QHS  . insulin aspart  0-9 Units Subcutaneous TID WC  . insulin aspart  8 Units Subcutaneous TID WC  . insulin glargine  20 Units Subcutaneous QHS  . isosorbide mononitrate  120 mg Oral Daily  . metolazone  5 mg Oral BID  . pantoprazole  40 mg Oral BID  . polyethylene glycol  17 g Oral Daily  .  sodium chloride  3 mL Intravenous Q12H  . sodium chloride  3 mL Intravenous Q12H   Continuous Infusions:     [facility]  [facility] Pager [phone]  If 8PM-8AM, please contact night-coverage at [email or web address], password [facility] 03/03/2013, 8:05 AM  LOS: 17 days

## 2013-03-03 NOTE — Progress Notes (Signed)
Report given to Rebecca RN.

## 2013-03-03 NOTE — Progress Notes (Signed)
PT Cancellation Note  Patient Details Name: Kaytelynn Scripter MRN: [id number] DOB: 03-Jun-1955   Cancelled Treatment:    Reason Eval/Treat Not Completed: Patient at procedure or test/unavailable.  Patient in OR today for graft placement.  Will return tomorrow for PT session.   Vena Austria 03/03/2013, 1:04 PM Durenda Hurt. Renaldo Fiddler, [facility] Pager [phone]

## 2013-03-03 NOTE — OR Nursing (Signed)
End of dialysis cath placement @1015 

## 2013-03-03 NOTE — Preoperative (Signed)
Beta Blockers   Reason not to administer Beta Blockers:Not Applicable 

## 2013-03-03 NOTE — H&P (View-Only) (Signed)
VVS Progress Note:  Plan Left arm AVF vs graft Friday AM.   VM shows Left BVT may be adequate size for fistula. All other veins are small  Plan: Left arm AVF vs graft and placement of catheter Friday am Please do not dialyze in AM prior to surgery  Agree with above Vein mapping reviewed and it appears that left basilic vein transposition may be only option for AV fistula Plan attempt at AV fistula left upper extremity possible left arm AV graft plus tunneled catheter in a.m. by Dr. early  Please be certain that patient is not on hemodialysis Friday a.m.

## 2013-03-03 NOTE — Progress Notes (Signed)
Patient returned from surgery in which a left fistula was inserted and HD was replaced in right upper chest area. Patient appears to have tolerated procedure without difficulty. After about after coming back from surgery, Surgical site of HD cath begin to bleed. Blood pressure cuff was on the right arm when patient returned from surgery and therefore blood pressure is being taking in the right thigh. Dressing on HD catheter site was changed and applied a pressure dressing with drainage gauze,4x 4 gauze and ABD pad to catch any further drainage. Applied pressure to stop bleeding and has resolved. Dressing is clean dry and intact. Patient also requested something for pain and morphine was given per PRN order. Blood pressure is currently elevated and morning blood pressure meds were given after return of her procedure. Will continue to monitor patient's blood pressure to end of shift. Patient's daughter at bedside at this time.

## 2013-03-03 NOTE — Interval H&P Note (Signed)
History and Physical Interval Note:  03/03/2013 7:24 AM  Madeline [name]  has presented today for surgery, with the diagnosis of ESRD  The various methods of treatment have been discussed with the patient and family. After consideration of risks, benefits and other options for treatment, the patient has consented to  Procedure(s): BASCILIC VEIN TRANSPOSITION VS INSERTION OF GRAFT (Left) INSERTION OF DIALYSIS CATHETER (N/A) as a surgical intervention .  The patient's history has been reviewed, patient examined, no change in status, stable for surgery.  I have reviewed the patient's chart and labs.  Questions were answered to the patient's satisfaction.     Madeline [name]

## 2013-03-03 NOTE — Progress Notes (Signed)
VASCULAR SURGERY: Call to see patient because of oozing from right IJ catheter site. There was a large bulky dressing on the catheter site. I removed the dressing and elevated the head of the bed. Some oozing from under the catheter was seen to be controlled with a pressure bandage. Her platelet count is 201,000. Hemoglobin is 7.4. Her last INR was 0.93. She is on aspirin and subcutaneous heparin.  If she continues to ooze, she may require placement of a suture around the exit site of the catheter and we may need to hold her subcutaneous heparin. Waverly Ferrari, MD, FACS Beeper [phone] 03/03/2013

## 2013-03-03 NOTE — Anesthesia Postprocedure Evaluation (Signed)
  Anesthesia Post-op Note  Patient: Madeline [name]  Procedure(s) Performed: Procedure(s) with comments: INSERTION OF DIALYSIS CATHETER (Right) - Right Internal Jugular Placement ARTERIOVENOUS (AV) FISTULA CREATION, Brachial/Cephalic (Left)  Patient Location: PACU  Anesthesia Type:General  Level of Consciousness: awake, alert , oriented and patient cooperative  Airway and Oxygen Therapy: Patient Spontanous Breathing and Patient connected to nasal cannula oxygen  Post-op Pain: none  Post-op Assessment: Post-op Vital signs reviewed, Patient's Cardiovascular Status Stable, Respiratory Function Stable, Patent Airway, No signs of Nausea or vomiting and Pain level controlled  Post-op Vital Signs: Reviewed and stable  Complications: No apparent anesthesia complications

## 2013-03-03 NOTE — Progress Notes (Signed)
Dr Jean Rosenthal aware pt BP 196/67 .  No orders received, may take pt back to room.

## 2013-03-03 NOTE — Progress Notes (Signed)
Patient resting in recliner chair and is waiting patiently for procedure today. Currently complains of no pain or discomfort. Will continue to monitor patient to end of shift.

## 2013-03-03 NOTE — Progress Notes (Signed)
Patient ID: Madeline [name], female   DOB: 27-Dec-1955, 57 y.o.   MRN: [id number] S:Pt feels better  O:BP 189/61  Pulse 55  Temp(Src) 98.4 F (36.9 C) (Oral)  Resp 16  Ht 4\' 9"  (1.448 m)  Wt 68.584 kg (151 lb 3.2 oz)  BMI 32.71 kg/m2  SpO2 95%  Intake/Output Summary (Last 24 hours) at 03/03/13 1344 Last data filed at 03/03/13 1253  Gross per 24 hour  Intake   1055 ml  Output   1100 ml  Net    -45 ml   Intake/Output: I/O last 3 completed shifts: In: 1182.8 [P.O.:1020; I.V.:162.8] Out: 2325 [Urine:2325]  Intake/Output this shift:  Total I/O In: 275 [I.V.:275] Out: -  Weight change: -6.416 kg (-14 lb 2.3 oz) Gen:WD WN AAF in NAD CVS:no rub Resp:cta  Abd: benign Ext:+edema,    Recent Labs Lab 02/25/13 0445 02/26/13 0546 02/27/13 0631 02/28/13 0530 02/28/13 1530 03/01/13 0620 03/02/13 0430 03/03/13 0815  NA 141 141 141 142  --  140 142 142  K 3.8 3.5 3.3* 3.8  --  3.8 3.5 3.3*  CL 105 104 103 103  --  101 103 102  CO2 21 25 27 29   --  27 30 31   GLUCOSE 194* 95 125* 91  --  283* 223* 115*  BUN 77* 81* 84* 89*  --  57* 36* 46*  CREATININE 2.93* 2.83* 2.85* 2.99* 3.02* 2.07* 1.73* 2.29*  ALBUMIN  --   --   --  1.7*  --   --   --  1.8*  CALCIUM 8.1* 8.1* 8.0* 7.9*  --  8.2* 7.6* 7.6*  PHOS  --   --   --  4.5  --   --   --  3.8  AST  --   --   --  59*  --   --   --   --   ALT  --   --   --  43*  --   --   --   --    Liver Function Tests:  Recent Labs Lab 02/28/13 0530 03/03/13 0815  AST 59*  --   ALT 43*  --   ALKPHOS 108  --   BILITOT 0.1*  --   PROT 5.4*  --   ALBUMIN 1.7* 1.8*   No results found for this basename: LIPASE, AMYLASE,  in the last 168 hours No results found for this basename: AMMONIA,  in the last 168 hours CBC:  Recent Labs Lab 02/28/13 0530 02/28/13 1530 03/01/13 0620 03/02/13 0430 03/03/13 0815  WBC 8.8 8.4 8.5 7.6 7.9  HGB 7.8* 7.8* 8.5* 8.2* 7.4*  HCT 23.9* 23.3* 25.7* 24.8* 23.0*  MCV 88.8 88.9 88.9 89.9 89.5  PLT 246 221 244  203 201   Cardiac Enzymes: No results found for this basename: CKTOTAL, CKMB, CKMBINDEX, TROPONINI,  in the last 168 hours CBG:  Recent Labs Lab 03/02/13 1110 03/02/13 1608 03/02/13 2104 03/03/13 0615 03/03/13 1139  GLUCAP 95 192* 365* 111* 144*    Iron Studies: No results found for this basename: IRON, TIBC, TRANSFERRIN, FERRITIN,  in the last 72 hours Studies/Results: Dg Chest Port 1 View  03/03/2013   CLINICAL DATA:  End-stage renal disease.  Catheter placement.  EXAM: PORTABLE CHEST - 1 VIEW  COMPARISON:  02/26/2013  FINDINGS: Stable enlarged cardiac silhouette. Interval placement of a right-sided dialysis catheter with split tips in the right atrium. No pneumothorax. Mild pulmonary edema is present.  IMPRESSION: Placement  of a right dialysis catheter with tips in the right atrium. No pneumothorax.  Cardiomegaly and mild pulmonary edema   Electronically Signed   By: Genevive Bi M.D.   On: 03/03/2013 12:12   . aspirin EC  81 mg Oral Daily  . carvedilol  40 mg Oral Daily  . cefUROXime (ZINACEF)  IV  1.5 g Intravenous On Call to OR  . cloNIDine  0.1 mg Oral Daily  . diltiazem  240 mg Oral Daily  . ferrous sulfate  325 mg Oral TID WC  . furosemide  40 mg Intravenous BID  . gabapentin  100 mg Oral BID  . heparin  5,000 Units Subcutaneous Q8H  . hydrALAZINE  100 mg Oral TID  . insulin aspart  0-5 Units Subcutaneous QHS  . insulin aspart  0-9 Units Subcutaneous TID WC  . insulin aspart  8 Units Subcutaneous TID WC  . insulin glargine  20 Units Subcutaneous QHS  . isosorbide mononitrate  120 mg Oral Daily  . metolazone  5 mg Oral BID  . pantoprazole  40 mg Oral BID  . polyethylene glycol  17 g Oral Daily  . sodium chloride  3 mL Intravenous Q12H  . sodium chloride  3 mL Intravenous Q12H    BMET    Component Value Date/Time   NA 142 03/03/2013 0815   K 3.3* 03/03/2013 0815   CL 102 03/03/2013 0815   CO2 31 03/03/2013 0815   GLUCOSE 115* 03/03/2013 0815   BUN 46*  03/03/2013 0815   CREATININE 2.29* 03/03/2013 0815   CALCIUM 7.6* 03/03/2013 0815   GFRNONAA 23* 03/03/2013 0815   GFRAA 26* 03/03/2013 0815   CBC    Component Value Date/Time   WBC 7.9 03/03/2013 0815   RBC 2.57* 03/03/2013 0815   RBC 2.79* 02/18/2013 1345   HGB 7.4* 03/03/2013 0815   HCT 23.0* 03/03/2013 0815   PLT 201 03/03/2013 0815   MCV 89.5 03/03/2013 0815   MCH 28.8 03/03/2013 0815   MCHC 32.2 03/03/2013 0815   RDW 15.4 03/03/2013 0815   LYMPHSABS 1.1 02/14/2013 1853   MONOABS 0.6 02/14/2013 1853   EOSABS 0.1 02/14/2013 1853   BASOSABS 0.0 02/14/2013 1853     Assessment/Plan:  1. CHF- preserved CO 1. Markedly improved volume following HD with UF and now with increase UOP to diuretics 2. Feels better.  3. Will hold off on more HD and follow UOP and daily Scr/weight. 4. Hopefully, the improved volume status will help her cardiorenal syndrome and will not require longterm RRT 5. She has had progressive CKD over the last year and a half. Will need to proceed with placement of permanent access and appreciate Dr. Bosie Helper attention/care. 2. 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  1. Would not add ACE/ARB given her multiple episodes of AKI/CKD related to right-sided HF and diuresis.  2. Awaiting UPEP results. 3. Cont with diuresis for now 4. Stress better diabetes control 3. AKI/CKD- s/p 2 sessions of HD with marked improvement. Responding to IV 40mg  bid. Hold off on HD for now. Not certain if this will be permanent or temporary, however given her advanced CKD, will likely need it longterm at some point. Discussed the ways to delay the progression of CKD with  1. Tight BP control with goal BP <130/80 2. Tight glucose control with goal Hgb A1c <7% 3. Use of an ace/arb (would not in  her case given frequent episodes of AKI/CKD) 4. Avoidance of nephrotoxic agents such  as NSAIDs/COX-II I's and IV contrast 4. OSA/pulm HTN- plan for HD with UF and would strongly recommend resumption of CPAP 5. HTN- not optimal. Would recommend diltiazem CD 240mg  rather than nifidipine for longer acting CCB and antiproteinuric effects. No response with UF on HD. Will also add clonidine 0.1mg  qhs to regimen.  1. Will check renin/aldo level as well given possible R RAS although may want to image for possible intervention.  2. Would also add isosorbide 20mg  bid (Bidil) to help with BP  6. ACDz- as above. 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 DM- poorly controlled. Plan per primary svc 9. Hypoalbuminemia- again possibly related to diabetic nephropathy but also on DDx is fatty liver disease or cardiac cirrhosis. 10. Anasarca/ascites- as above 11. CAD s/p NSTEMI 12. Pulm HTN- recommend CPAP compliance 13. Vascular access- appreciate plan for AVF/AVG placement by Dr. Arbie Cookey as well as tunneled HD catheter. Some bleeding at [facility] site.  vVS to evaluate 14. dispo- will follow renal function and diuresis.  Hopefully she can be discharged without longterm HD, however if she requires reinitiation, will then commit to IHD and will need arrangements for outpt HD.  Madeline [name] A

## 2013-03-03 NOTE — Transfer of Care (Signed)
Immediate Anesthesia Transfer of Care Note  Patient: Marely Robben  Procedure(s) Performed: Procedure(s) with comments: INSERTION OF DIALYSIS CATHETER (Right) - Right Internal Jugular Placement ARTERIOVENOUS (AV) FISTULA CREATION, Brachial/Cephalic (Left)  Patient Location: PACU  Anesthesia Type:General  Level of Consciousness: sedated and patient cooperative  Airway & Oxygen Therapy: Patient Spontanous Breathing and Patient connected to face mask oxygen  Post-op Assessment: Report given to PACU RN, Post -op Vital signs reviewed and stable and Patient moving all extremities X 4  Post vital signs: Reviewed and stable  Complications: No apparent anesthesia complications

## 2013-03-03 NOTE — Anesthesia Preprocedure Evaluation (Addendum)
Anesthesia Evaluation  Patient identified by MRN, date of birth, ID band Patient awake    Reviewed: Allergy & Precautions, H&P , NPO status , Patient's Chart, lab work & pertinent test results, reviewed documented beta blocker date and time   History of Anesthesia Complications Negative for: history of anesthetic complications  Airway       Dental   Pulmonary asthma , sleep apnea and Continuous Positive Airway Pressure Ventilation , COPD COPD inhaler, former smoker,          Cardiovascular hypertension, Pt. on medications and Pt. on home beta blockers + CAD, + Past MI and +CHF  10/14 ECHO: EF 65%, LV systolic function normal, probable diastolic dysfunction, valves Ok, mod pulm HTN 8/13 stress test: no ischemia, EF 74%   Neuro/Psych    GI/Hepatic Neg liver ROS, GERD-  Medicated and Controlled,  Endo/Other  diabetes (glu 111), Type 2, Insulin DependentMorbid obesity  Renal/GU ESRF and DialysisRenal disease     Musculoskeletal   Abdominal   Peds  Hematology  (+) Blood dyscrasia (Hb 8.2), anemia ,   Anesthesia Other Findings   Reproductive/Obstetrics                        Anesthesia Physical Anesthesia Plan  ASA: III  Anesthesia Plan:    Post-op Pain Management:    Induction:   Airway Management Planned:   Additional Equipment:   Intra-op Plan:   Post-operative Plan:   Informed Consent:   Plan Discussed with:   Anesthesia Plan Comments:         Anesthesia Quick Evaluation

## 2013-03-03 NOTE — Progress Notes (Signed)
  Patient in OR for AVF and perm-cath placement.  Will sign off. Volume management per Renal. Please call me as needed for questions.  Can schedule f/u in HF Clinic as needed [phone].   Lenoard Helbert,MD 11:56 AM

## 2013-03-03 NOTE — Op Note (Signed)
OPERATIVE REPORT  DATE OF SURGERY: 03/03/2013  PATIENT: Madeline [name], 57 y.o. female MRN: [id number]  DOB: 06/26/1955  PRE-OPERATIVE DIAGNOSIS: End-stage renal disease  POST-OPERATIVE DIAGNOSIS:  Same  PROCEDURE: #1 right IJ hemodialysis catheter, #2 left upper arm brachiocephalic fistula  SURGEON:  Gretta Began, M.D.  PHYSICIAN ASSISTANT: Roczniak  ANESTHESIA:  Gen.  EBL: Minimal ml  Total I/O In: 200 [I.V.:200] Out: -   BLOOD ADMINISTERED: None  DRAINS: None none  SPECIMEN: None  COUNTS CORRECT:  YES  PLAN OF CARE: PACU   PATIENT DISPOSITION:  PACU - hemodynamically stable  PROCEDURE DETAILS: The patient was taken up replacing that is where the area of the chest were prepped and draped in the usual sterile fashion. The patient has a temporary right IJ catheter. This was prepped into the field as well. The patient was placed in Trendelenburg position and a guidewire was passed down the distal lumen of the indwelling temporary catheter. The catheter was removed in its entirety. A dilator and peel-away sheath was passed over the guidewire and the dilator and guidewire were removed. A 23 cm hemodialysis catheter was passed through the peel-away sheath the peel-away sheath was removed. The catheter tips were positioned level of the distal right atrium. The catheter was brought through subcutaneous tunnel to the stab incision. Both the lumens flushed and aspirated easily after the 2 ports were attached. The catheter was secured to the skin 3-0 nylon stitch and the entry site was closed with a 4-0 subcuticular Vicryl stitch. Sterile dressing was applied and the catheter was locked with 1000 per unit per cc  Tissues and turned to the left arm which was prepped and draped in the usual sterile fashion. Ultrasound SonoSite was used to visualize the cephalic vein which was of good caliber at the antecubital space and up onto the upper arm. The patient also had a good caliber basilic vein.  Both of these were marked. Incision was made over the antecubital space due to the cephalic vein this was felt to be adequate for fistula creation. 2 repair branches were ligated with 30 and 4 surpassed divided. The vein was ligated distally was mobilized the level of the brachial artery. The patient large caliber brachial artery. The artery was occluded proximal and distally was opened with an 11 blade and symmetry Potts scissors. The vein was cut to appropriate length and spatulated and sewn end-to-side to the artery with a running 6-0 Prolene suture. Removed and excellent thrill was noted. Wound irrigated with saline. Hemostasis daily cautery. Wounds were closed with 3-0 Vicryl the subcutaneous and subcuticular tissue. Benzoin Steri-Strips were applied the patient was taken to the recovery in stable condition   Gretta Began, M.D. 03/03/2013 12:50 PM

## 2013-03-03 NOTE — Anesthesia Procedure Notes (Addendum)
Procedure Name: LMA Insertion Date/Time: 03/03/2013 9:41 AM Performed by: Sherie Don Pre-anesthesia Checklist: Patient identified, Emergency Drugs available, Suction available, Patient being monitored and Timeout performed Patient Re-evaluated:Patient Re-evaluated prior to inductionOxygen Delivery Method: Circle system utilized Preoxygenation: Pre-oxygenation with 100% oxygen Intubation Type: IV induction LMA: LMA inserted LMA Size: 4.0 Number of attempts: 1 Placement Confirmation: positive ETCO2 Tube secured with: Tape Dental Injury: Teeth and Oropharynx as per pre-operative assessment    Procedure Name: Intubation Date/Time: 03/03/2013 9:50 AM Performed by: Sherie Don Pre-anesthesia Checklist: Patient identified, Emergency Drugs available, Suction available, Patient being monitored and Timeout performed Patient Re-evaluated:Patient Re-evaluated prior to inductionOxygen Delivery Method: Circle system utilized Preoxygenation: Pre-oxygenation with 100% oxygen Intubation Type: IV induction Ventilation: Mask ventilation without difficulty Laryngoscope Size: Mac and 3 Grade View: Grade II Tube type: Oral Tube size: 7.0 mm Number of attempts: 1 Airway Equipment and Method: Stylet Placement Confirmation: ETT inserted through vocal cords under direct vision,  positive ETCO2 and breath sounds checked- equal and bilateral Secured at: 22 cm Tube secured with: Tape Dental Injury: Teeth and Oropharynx as per pre-operative assessment

## 2013-03-04 LAB — CBC
HCT: 19.8 % — ABNORMAL LOW (ref 36.0–46.0)
MCH: 29.6 pg (ref 26.0–34.0)
Platelets: 173 10*3/uL (ref 150–400)
RBC: 2.16 MIL/uL — ABNORMAL LOW (ref 3.87–5.11)
RDW: 16 % — ABNORMAL HIGH (ref 11.5–15.5)
WBC: 7.4 10*3/uL (ref 4.0–10.5)

## 2013-03-04 LAB — PREPARE RBC (CROSSMATCH)

## 2013-03-04 LAB — RENAL FUNCTION PANEL
Albumin: 1.7 g/dL — ABNORMAL LOW (ref 3.5–5.2)
CO2: 28 mEq/L (ref 19–32)
GFR calc Af Amer: 25 mL/min — ABNORMAL LOW (ref >90)
Phosphorus: 4.7 mg/dL — ABNORMAL HIGH (ref 2.3–4.6)
Potassium: 3.7 mEq/L (ref 3.5–5.1)
Sodium: 144 mEq/L (ref 135–145)

## 2013-03-04 LAB — GLUCOSE, CAPILLARY
Glucose-Capillary: 151 mg/dL — ABNORMAL HIGH (ref 70–99)
Glucose-Capillary: 228 mg/dL — ABNORMAL HIGH (ref 70–99)
Glucose-Capillary: 68 mg/dL — ABNORMAL LOW (ref 70–99)

## 2013-03-04 MED ORDER — AMLODIPINE BESYLATE 10 MG PO TABS
10.0000 mg | ORAL_TABLET | Freq: Every day | ORAL | Status: DC
  Administered 2013-03-04 – 2013-03-05 (×2): 10 mg via ORAL
  Filled 2013-03-04 (×2): qty 1

## 2013-03-04 NOTE — Progress Notes (Signed)
Patient ID: Madeline [name], female   DOB: 1955/05/17, 57 y.o.   MRN: [id number] S:feels better O:BP 150/56  Pulse 60  Temp(Src) 98.9 F (37.2 C) (Oral)  Resp 18  Ht 4\' 9"  (1.448 m)  Wt 68.9 kg (151 lb 14.4 oz)  BMI 32.86 kg/m2  SpO2 100%  Intake/Output Summary (Last 24 hours) at 03/04/13 1143 Last data filed at 03/04/13 1057  Gross per 24 hour  Intake    825 ml  Output   1740 ml  Net   -915 ml   Intake/Output: I/O last 3 completed shifts: In: 1055 [P.O.:780; I.V.:275] Out: 1740 [Urine:1225; Other:515]  Intake/Output this shift:  Total I/O In: 270 [P.O.:220; Blood:50] Out: -  Weight change: 0.316 kg (11.2 oz) Gen:WD WN AAF in NAD CVS:no rub Resp:cta WUJ:WJXBJY Ext:tr edema, LUE AVF +T/B   Recent Labs Lab 02/26/13 0546 02/27/13 0631 02/28/13 0530 02/28/13 1530 03/01/13 0620 03/02/13 0430 03/03/13 0815 03/04/13 0410  NA 141 141 142  --  140 142 142 144  K 3.5 3.3* 3.8  --  3.8 3.5 3.3* 3.7  CL 104 103 103  --  101 103 102 106  CO2 25 27 29   --  27 30 31 28   GLUCOSE 95 125* 91  --  283* 223* 115* 116*  BUN 81* 84* 89*  --  57* 36* 46* 47*  CREATININE 2.83* 2.85* 2.99* 3.02* 2.07* 1.73* 2.29* 2.38*  ALBUMIN  --   --  1.7*  --   --   --  1.8* 1.7*  CALCIUM 8.1* 8.0* 7.9*  --  8.2* 7.6* 7.6* 7.5*  PHOS  --   --  4.5  --   --   --  3.8 4.7*  AST  --   --  59*  --   --   --   --   --   ALT  --   --  43*  --   --   --   --   --    Liver Function Tests:  Recent Labs Lab 02/28/13 0530 03/03/13 0815 03/04/13 0410  AST 59*  --   --   ALT 43*  --   --   ALKPHOS 108  --   --   BILITOT 0.1*  --   --   PROT 5.4*  --   --   ALBUMIN 1.7* 1.8* 1.7*   No results found for this basename: LIPASE, AMYLASE,  in the last 168 hours No results found for this basename: AMMONIA,  in the last 168 hours CBC:  Recent Labs Lab 02/28/13 1530 03/01/13 0620 03/02/13 0430 03/03/13 0815 03/04/13 0410  WBC 8.4 8.5 7.6 7.9 7.4  HGB 7.8* 8.5* 8.2* 7.4* 6.4*  HCT 23.3* 25.7* 24.8*  23.0* 19.8*  MCV 88.9 88.9 89.9 89.5 91.7  PLT 221 244 203 201 173   Cardiac Enzymes: No results found for this basename: CKTOTAL, CKMB, CKMBINDEX, TROPONINI,  in the last 168 hours CBG:  Recent Labs Lab 03/03/13 1321 03/03/13 1625 03/03/13 2127 03/04/13 0558 03/04/13 1111  GLUCAP 147* 172* 107* 151* 228*    Iron Studies: No results found for this basename: IRON, TIBC, TRANSFERRIN, FERRITIN,  in the last 72 hours Studies/Results: Dg Chest Port 1 View  03/03/2013   CLINICAL DATA:  End-stage renal disease.  Catheter placement.  EXAM: PORTABLE CHEST - 1 VIEW  COMPARISON:  02/26/2013  FINDINGS: Stable enlarged cardiac silhouette. Interval placement of a right-sided dialysis catheter with split tips in  the right atrium. No pneumothorax. Mild pulmonary edema is present.  IMPRESSION: Placement of a right dialysis catheter with tips in the right atrium. No pneumothorax.  Cardiomegaly and mild pulmonary edema   Electronically Signed   By: Genevive Bi M.D.   On: 03/03/2013 12:12   Dg Fluoro Guide Cv Line-no Report  03/03/2013   CLINICAL DATA: dialysis cath   FLOURO GUIDE CV LINE  Fluoroscopy was utilized by the requesting physician.  No radiographic  interpretation.    Marland Kitchen aspirin EC  81 mg Oral Daily  . carvedilol  40 mg Oral Daily  . cloNIDine  0.1 mg Oral Daily  . diltiazem  240 mg Oral Daily  . ferrous sulfate  325 mg Oral TID WC  . furosemide  40 mg Intravenous BID  . gabapentin  100 mg Oral BID  . heparin  5,000 Units Subcutaneous Q8H  . hydrALAZINE  100 mg Oral TID  . insulin aspart  0-5 Units Subcutaneous QHS  . insulin aspart  0-9 Units Subcutaneous TID WC  . insulin aspart  8 Units Subcutaneous TID WC  . insulin glargine  20 Units Subcutaneous QHS  . isosorbide mononitrate  120 mg Oral Daily  . metolazone  5 mg Oral BID  . pantoprazole  40 mg Oral BID  . polyethylene glycol  17 g Oral Daily  . sodium chloride  3 mL Intravenous Q12H  . sodium chloride  3 mL Intravenous  Q12H    BMET    Component Value Date/Time   NA 144 03/04/2013 0410   K 3.7 03/04/2013 0410   CL 106 03/04/2013 0410   CO2 28 03/04/2013 0410   GLUCOSE 116* 03/04/2013 0410   BUN 47* 03/04/2013 0410   CREATININE 2.38* 03/04/2013 0410   CALCIUM 7.5* 03/04/2013 0410   GFRNONAA 22* 03/04/2013 0410   GFRAA 25* 03/04/2013 0410   CBC    Component Value Date/Time   WBC 7.4 03/04/2013 0410   RBC 2.16* 03/04/2013 0410   RBC 2.79* 02/18/2013 1345   HGB 6.4* 03/04/2013 0410   HCT 19.8* 03/04/2013 0410   PLT 173 03/04/2013 0410   MCV 91.7 03/04/2013 0410   MCH 29.6 03/04/2013 0410   MCHC 32.3 03/04/2013 0410   RDW 16.0* 03/04/2013 0410   LYMPHSABS 1.1 02/14/2013 1853   MONOABS 0.6 02/14/2013 1853   EOSABS 0.1 02/14/2013 1853   BASOSABS 0.0 02/14/2013 1853   Impression: 1. CAD s/p NSTEMI 2. Decompensated diastolic CHF 3. AKI/CKD 4. Proteinuria 5. DM 6. OSA 7. Pulm HTN 8. Poorly controlled HTN 9. ABLA 10. Anemia of chronic disease 11. SHPTH  Assessment/Plan:  1. CHF- preserved CO  1. Markedly improved volume following HD with UF and now with increase UOP to diuretics 2. Feels better.  2. AKI/CKD- s/p 2 sessions of HD with marked improvement. Responding to IV 40mg  bid. Hold off on HD for now. Not certain if this will be permanent or temporary, however given her advanced CKD, will likely need it longterm at some point. Discussed the ways to delay the progression of CKD with  1. Tight BP control with goal BP <130/80 2. Tight glucose control with goal Hgb A1c <7% 3. Use of an ace/arb (would not in her case given frequent episodes of AKI/CKD) 4. Avoidance of nephrotoxic agents such as NSAIDs/COX-II I's and IV contrast 3. HTN- not optimal. Agree with primary svc that bradycardia is limiting agents.  Will stop cardizem and start amlodipine even though this will not help  with proteinuria and titrate other meds as needed and HR allows. 1. Would also add isosorbide 20mg  bid (Bidil) to  help with BP  4. ABLA/ACDz- receiving blood transfusion.  Will follow H/H. Will check SPEP/UPEP, serologies, and iron stores 5. CKD-MBD- elevated iPTH, will start calcitriol 0.66mcg daily. 6. Anasarca/ascites- as above 7. CAD s/p NSTEMI 8. Pulm HTN- recommend CPAP compliance 9. Vascular access- appreciate plan for AVF/AVG placement by Dr. Arbie Cookey as well as tunneled HD catheter. 10. dispo- will follow renal function and diuresis. Hopefully she can be discharged without longterm HD, however if she requires reinitiation, will then commit to IHD and will need arrangements for outpt HD. 11.  Lavette Yankovich A

## 2013-03-04 NOTE — Progress Notes (Signed)
Pt's HD site dressing observed to have minimal bleeding, new dressing applied. Will continue to monitor.

## 2013-03-04 NOTE — Progress Notes (Signed)
[facility] PROGRESS NOTE Interim History: 57 year old female patient with history of congestive heart failure coronary artery disease as well as chronic kidney disease. She also has diabetes and chronic anemia related to her kidney disease. She presented to the hospital with complaints of shortness of breath and was found to have systolic blood pressure greater than 200. Chest x-ray was consistent with possible pulmonary edema. Due to the degree of respiratory failure she did require BiPAP in the emergency department. She was also temporarily started on IV nitroglycerin and IV Lasix was given with subsequent improvement in the patient's blood pressure and breathing therefore BiPAP was weaned off. Pt was doing quite well with diuresis but then started having SOB on Wenesday, her lasix was increase and started on zaroxolyn. Her SOB improved and edema along with JVD was resolving. Then again Sunday her JVD increase and edema worsen. Cardiology was consulted for RHC as was nephrology for ? Proteniuria. She was then dialysed and has improved significantly.   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 She is status post RHC 10/21 which revealed elevated filling pressures with normal cardiac output. Was on lasix infusion which was changed 10/23. Continue coreg, Hydralazine and Imdur. Cont zaroxolyn for now. Patient may require long term RRT in future. She underwent vein grafting and permanent catheter 10/24. Was started on steroids for wheezing which has been tapered to off. Can Lasix be changed to oral route?  Chronic kidney disease (CKD), stage IV (severe)  As above. Permanent access has been placed. Unclear if she will need long term dialysis at this time. Nephrology to determine at outpatient follow up.    Anemia, chronic disease/Acute blood loss anemia  Drop in Hgb noted and most likely due to oozing from HD cathter site. Will be transfused one unit. Hgb nadir 5.5 earlier in this hospitalization. EGD revealed small multiple focal antral ulcers. PPI BID.  DM (diabetes mellitus)  Lantus dose adjusted 10/24. CBG better. Cont monitor CBG's. A1C was 7.7.  Accelerated HTN BP is better but still quite elevated. Low HR prevent further adjustments to cardizem and BB and possibly even to clonidine. Will discuss with Nephrology. Can consider adding different category of CCB. PRN Labetalol.  Code Status: full Family Communication: Discussed with patient. Disposition Plan: Per nephrology. Hopefully over the weekend.   Consultants:  Cards  Nephrology  Vascular  Procedures: ECHO 10.8.2014 Ejection fraction was 65%. Wall motion was normal; there were no regional wall motion abnormalities. Findings consistent with left ventricular diastolic dysfunction. Doppler parameters are consistent with high ventricular filling pressure  EGD 02/16/13 Scattered gastric ulcerations  Temporary dialysis catheter placement 10/21  Graft/Fistula placement and Permanent dialysis catheter placement 10/24  Antibiotics:  None   HPI/Subjective: She feels better. No chest pain. No nausea or vomiting.  Objective: Filed Vitals:   03/03/13 1902 03/03/13 2000 03/03/13 2335 03/04/13 0446  BP: 170/67 132/59  181/55  Pulse:  52 56 58  Temp:  98.9 F (37.2 C)  98.2 F (36.8 C)  TempSrc:  Oral  Axillary  Resp:  18 16 20   Height:      Weight:    68.9 kg (151 lb 14.4 oz)  SpO2:  100% 99% 100%    Filed Weights   03/02/13 0530 03/03/13 0557 03/04/13 0446  Weight: 69.264 kg (152 lb 11.2 oz) 68.584 kg (151 lb 3.2 oz) 68.9  kg (151 lb 14.4 oz)   10/24 0701 - 10/25 0700 In: 755 [P.O.:480; I.V.:275] Out: 1740 [Urine:1225]  Neg 27 ltrs this admission  Exam: General: Alert, awake, oriented x3, in no acute  distress.  Heart: Regular rate and rhythm. Systolic murmur over apex. 1+ edema with scaling of her skin. Lungs: diminished, no wheezing Abdomen: Soft, nontender, nondistended, positive bowel sounds.   Data Reviewed: Basic Metabolic Panel:  Recent Labs Lab 02/27/13 0631 02/28/13 0530 02/28/13 1530 03/01/13 0620 03/02/13 0430 03/03/13 0815 03/04/13 0410  NA 141 142  --  140 142 142 144  K 3.3* 3.8  --  3.8 3.5 3.3* 3.7  CL 103 103  --  101 103 102 106  CO2 27 29  --  27 30 31 28   GLUCOSE 125* 91  --  283* 223* 115* 116*  BUN 84* 89*  --  57* 36* 46* 47*  CREATININE 2.85* 2.99* 3.02* 2.07* 1.73* 2.29* 2.38*  CALCIUM 8.0* 7.9*  --  8.2* 7.6* 7.6* 7.5*  MG  --  1.8  --   --   --   --   --   PHOS  --  4.5  --   --   --  3.8 4.7*   Liver Function Tests:  Recent Labs Lab 02/28/13 0530 03/03/13 0815 03/04/13 0410  AST 59*  --   --   ALT 43*  --   --   ALKPHOS 108  --   --   BILITOT 0.1*  --   --   PROT 5.4*  --   --   ALBUMIN 1.7* 1.8* 1.7*   CBC:  Recent Labs Lab 02/28/13 1530 03/01/13 0620 03/02/13 0430 03/03/13 0815 03/04/13 0410  WBC 8.4 8.5 7.6 7.9 7.4  HGB 7.8* 8.5* 8.2* 7.4* 6.4*  HCT 23.3* 25.7* 24.8* 23.0* 19.8*  MCV 88.9 88.9 89.9 89.5 91.7  PLT 221 244 203 201 173   BNP (last 3 results)  Recent Labs  02/14/13 2333 02/22/13 0709 02/26/13 0546  PROBNP 8501.0* 5090.0* 7568.0*   CBG:  Recent Labs Lab 03/03/13 1139 03/03/13 1321 03/03/13 1625 03/03/13 2127 03/04/13 0558  GLUCAP 144* 147* 172* 107* 151*    Studies: Dg Chest Port 1 View  03/03/2013   CLINICAL DATA:  End-stage renal disease.  Catheter placement.  EXAM: PORTABLE CHEST - 1 VIEW  COMPARISON:  02/26/2013  FINDINGS: Stable enlarged cardiac silhouette. Interval placement of a right-sided dialysis catheter with split tips in the right atrium. No pneumothorax. Mild pulmonary edema is present.  IMPRESSION: Placement of a right dialysis catheter with tips in the right atrium. No  pneumothorax.  Cardiomegaly and mild pulmonary edema   Electronically Signed   By: Genevive Bi M.D.   On: 03/03/2013 12:12   Dg Fluoro Guide Cv Line-no Report  03/03/2013   CLINICAL DATA: dialysis cath   FLOURO GUIDE CV LINE  Fluoroscopy was utilized by the requesting physician.  No radiographic  interpretation.     Scheduled Meds: . aspirin EC  81 mg Oral Daily  . carvedilol  40 mg Oral Daily  . cloNIDine  0.1 mg Oral Daily  . diltiazem  240 mg Oral Daily  . ferrous sulfate  325 mg Oral TID WC  . furosemide  40 mg Intravenous BID  . gabapentin  100 mg Oral BID  . heparin  5,000 Units Subcutaneous Q8H  . hydrALAZINE  100 mg Oral TID  . insulin aspart  0-5 Units Subcutaneous QHS  .  insulin aspart  0-9 Units Subcutaneous TID WC  . insulin aspart  8 Units Subcutaneous TID WC  . insulin glargine  20 Units Subcutaneous QHS  . isosorbide mononitrate  120 mg Oral Daily  . metolazone  5 mg Oral BID  . pantoprazole  40 mg Oral BID  . polyethylene glycol  17 g Oral Daily  . sodium chloride  3 mL Intravenous Q12H  . sodium chloride  3 mL Intravenous Q12H   Continuous Infusions:     [facility]  [facility] Pager [phone]  If 8PM-8AM, please contact night-coverage at [email or web address], password [facility] 03/04/2013, 9:38 AM  LOS: 18 days

## 2013-03-04 NOTE — Significant Event (Signed)
Notified at 0930 patient's blood ready for transfusion for lab  Hem of  6.4 . Blood retreive from blood bank and checked off by two RN , vss taken .Blood  Transfusion started at 1000 am, blood documentation competed . Medication pass started late at 1020 due to transfusion. Patient's daughter requested another nurse because patient medications were 15 minutes late . Tai charge nurse notified of  daughter's request. Peggy RN assigned to patient . Daughter continues to be confrontational with staff.

## 2013-03-04 NOTE — Progress Notes (Signed)
Physical Therapy Treatment Patient Details Name: Bahar Shelden MRN: [id number] DOB: 1955-11-22 Today's Date: 03/04/2013 Time: 4098-1191 PT Time Calculation (min): 16 min  PT Assessment / Plan / Recommendation  History of Present Illness 57 year old female patient with history of congestive heart failure coronary artery disease as well as chronic kidney disease. She also has diabetes and chronic anemia related to her kidney disease. She presented to the hospital with complaints of shortness of breath and was found systolic blood pressure greater than 200. Chest x-ray was consistent with possible pulmonary edema. Patient had rapid onset of shortness of breath according to her family and no constitutional symptoms that would lead one to believe she had an infectious process. He over the past several days patient has also had increasing swelling of her extremities. She has had several CHF exacerbations over the past year and has been hospitalized.   PT Comments   Patient with recent medical issues impacting mobility - has AV graft in LUE.  Able to ambulate 72' today.  Current goals remain appropriate.  Will continue therapy.  Follow Up Recommendations  Home health PT;Supervision/Assistance - 24 hour     Does the patient have the potential to tolerate intense rehabilitation     Barriers to Discharge        Equipment Recommendations  None recommended by PT    Recommendations for Other Services    Frequency Min 3X/week   Progress towards PT Goals Progress towards PT goals: Not progressing toward goals - comment (Due to recent multiple medical issues.  Goals remain approp.)  Plan Current plan remains appropriate    Precautions / Restrictions Precautions Precautions: Fall Restrictions Weight Bearing Restrictions: No   Pertinent Vitals/Pain Pain limiting use of LUE.    Mobility  Bed Mobility Bed Mobility: Not assessed Transfers Transfers: Sit to Stand;Stand to Sit Sit to Stand: 4: Min  assist;With upper extremity assist;With armrests;From chair/3-in-1 Stand to Sit: 4: Min guard;With upper extremity assist;With armrests;To chair/3-in-1 Details for Transfer Assistance: Verbal cues for hand placement.  Assist to rise to standing and for balance initially. Ambulation/Gait Ambulation/Gait Assistance: 4: Min guard Ambulation Distance (Feet): 76 Feet Assistive device: Rolling walker Ambulation/Gait Assistance Details: Verbal cues to stand upright.  Assist to maneuver RW especially in turns. Gait Pattern: Step-through pattern;Decreased stride length;Trunk flexed Gait velocity: decreased      PT Goals (current goals can now be found in the care plan section)    Visit Information  Last PT Received On: 03/04/13 Assistance Needed: +1 History of Present Illness: 57 year old female patient with history of congestive heart failure coronary artery disease as well as chronic kidney disease. She also has diabetes and chronic anemia related to her kidney disease. She presented to the hospital with complaints of shortness of breath and was found systolic blood pressure greater than 200. Chest x-ray was consistent with possible pulmonary edema. Patient had rapid onset of shortness of breath according to her family and no constitutional symptoms that would lead one to believe she had an infectious process. He over the past several days patient has also had increasing swelling of her extremities. She has had several CHF exacerbations over the past year and has been hospitalized.    Subjective Data  Subjective: "I'm starting all over"   Cognition  Cognition Arousal/Alertness: Awake/alert Behavior During Therapy: WFL for tasks assessed/performed Overall Cognitive Status: Within Functional Limits for tasks assessed    Balance     End of Session PT - End of Session Equipment Utilized  During Treatment: Gait belt Activity Tolerance: Patient limited by fatigue;Patient limited by pain Patient  left: in chair;with call bell/phone within reach Nurse Communication: Mobility status   GP     Vena Austria 03/04/2013, 3:59 PM Durenda Hurt. Renaldo Fiddler, [facility] Pager [phone]

## 2013-03-04 NOTE — Progress Notes (Signed)
Pt's not able to void since 1900 last night, tried to use the bedpan but was not successful, bladder scan showed . Lenny Pastel NP notified and ordered In & out cath, order carried out. 475cc out.

## 2013-03-04 NOTE — Progress Notes (Signed)
Pt's hgb=6.4, Lenny Pastel NP notified and ordered to transfuse 1 unit of PRBC.

## 2013-03-04 NOTE — Progress Notes (Signed)
CRITICAL VALUE ALERT  Critical value received:  Hgb=6.4  Date of notification:  03/04/13  Time of notification: 0615  Critical value read back:yes  Nurse who received alert:  G. Adelita Hone  MD notified (1st Labat): Lenny Pastel NP  Time of first Hinostroza:  0620  MD notified (2nd Matsushima):  Time of second Rauth:  Responding MD:  Lenny Pastel NP  Time MD responded:  [phone]

## 2013-03-04 NOTE — Progress Notes (Signed)
VASCULAR PROGRESS NOTE  SUBJECTIVE: No specific complaints. No further bleeding from catheter site.  PHYSICAL EXAM: Filed Vitals:   03/03/13 1902 03/03/13 2000 03/03/13 2335 03/04/13 0446  BP: 170/67 132/59  181/55  Pulse:  52 56 58  Temp:  98.9 F (37.2 C)  98.2 F (36.8 C)  TempSrc:  Oral  Axillary  Resp:  18 16 20   Height:      Weight:    151 lb 14.4 oz (68.9 kg)  SpO2:  100% 99% 100%   Dressing on R IJ catheter site is dry. Good thrill in Left BC AVF. Incision OK Left hand well perfused.  LABS: Lab Results  Component Value Date   WBC 7.4 03/04/2013   HGB 6.4* 03/04/2013   HCT 19.8* 03/04/2013   MCV 91.7 03/04/2013   PLT 173 03/04/2013   Lab Results  Component Value Date   CREATININE 2.38* 03/04/2013   Lab Results  Component Value Date   INR 0.93 02/14/2013   CBG (last 3)   Recent Labs  03/03/13 1625 03/03/13 2127 03/04/13 0558  GLUCAP 172* 107* 151*    Active Problems:   ARF (acute renal failure)   Anemia, chronic disease   DM (diabetes mellitus)   Acute respiratory failure with hypoxia   Acute diastolic CHF (congestive heart failure)   HTN (hypertension), malignant   Chronic kidney disease (CKD), stage IV (severe)   Acute blood loss anemia   Anasarca and ascites   Acute right heart failure   OSA on CPAP   Acute on chronic diastolic congestive heart failure   Acute on chronic renal failure   ASSESSMENT AND PLAN: * Because of the oozing from the catheter, I held the ASA and Lovenox for 24 hours. Ok to resume that now. * Fistula with good thrill. * Vascular will be available as needed.  Cari Caraway Beeper: [phone] 03/04/2013

## 2013-03-05 LAB — RENAL FUNCTION PANEL
Albumin: 1.9 g/dL — ABNORMAL LOW (ref 3.5–5.2)
BUN: 53 mg/dL — ABNORMAL HIGH (ref 6–23)
Calcium: 8 mg/dL — ABNORMAL LOW (ref 8.4–10.5)
Creatinine, Ser: 2.49 mg/dL — ABNORMAL HIGH (ref 0.50–1.10)
Phosphorus: 4.5 mg/dL (ref 2.3–4.6)
Potassium: 3.8 mEq/L (ref 3.5–5.1)

## 2013-03-05 LAB — CBC
HCT: 24.9 % — ABNORMAL LOW (ref 36.0–46.0)
Hemoglobin: 8.2 g/dL — ABNORMAL LOW (ref 12.0–15.0)
MCH: 29.8 pg (ref 26.0–34.0)
MCHC: 32.9 g/dL (ref 30.0–36.0)
MCV: 90.5 fL (ref 78.0–100.0)
Platelets: 180 10*3/uL (ref 150–400)
RBC: 2.75 MIL/uL — ABNORMAL LOW (ref 3.87–5.11)
RDW: 16.4 % — ABNORMAL HIGH (ref 11.5–15.5)
WBC: 9.4 10*3/uL (ref 4.0–10.5)

## 2013-03-05 LAB — GLUCOSE, CAPILLARY
Glucose-Capillary: 117 mg/dL — ABNORMAL HIGH (ref 70–99)
Glucose-Capillary: 244 mg/dL — ABNORMAL HIGH (ref 70–99)

## 2013-03-05 MED ORDER — POLYETHYLENE GLYCOL 3350 17 G PO PACK: 17.0000 g | PACK | Freq: Every day | ORAL | Status: DC | PRN

## 2013-03-05 MED ORDER — AMLODIPINE BESYLATE 10 MG PO TABS: 10.0000 mg | ORAL_TABLET | Freq: Every day | ORAL | Status: DC

## 2013-03-05 MED ORDER — CARVEDILOL PHOSPHATE ER 40 MG PO CP24: 40.0000 mg | ORAL_CAPSULE | Freq: Every day | ORAL | Status: DC

## 2013-03-05 MED ORDER — FUROSEMIDE 40 MG PO TABS: 40.0000 mg | ORAL_TABLET | Freq: Two times a day (BID) | ORAL | Status: DC

## 2013-03-05 MED ORDER — HYDRALAZINE HCL 100 MG PO TABS: 100.0000 mg | ORAL_TABLET | Freq: Three times a day (TID) | ORAL | Status: DC

## 2013-03-05 MED ORDER — INSULIN GLARGINE 100 UNIT/ML ~~LOC~~ SOLN: 20.0000 [IU] | Freq: Every day | SUBCUTANEOUS | Status: DC

## 2013-03-05 MED ORDER — CLONIDINE HCL 0.1 MG PO TABS: 0.1000 mg | ORAL_TABLET | Freq: Every day | ORAL | Status: DC

## 2013-03-05 MED ORDER — METOLAZONE 5 MG PO TABS: 5.0000 mg | ORAL_TABLET | Freq: Two times a day (BID) | ORAL | Status: DC

## 2013-03-05 MED ORDER — GABAPENTIN 300 MG PO CAPS: 300.0000 mg | ORAL_CAPSULE | Freq: Every day | ORAL | Status: DC

## 2013-03-05 NOTE — Progress Notes (Addendum)
Pt d/c'd home with daughter. D/c instructions d/w pt and daughter, rx provided. Medications, old and new  D/w both. Pt in good spirits, up and about in room with walker.

## 2013-03-05 NOTE — Progress Notes (Signed)
Physical Therapy Treatment Patient Details Name: Madeline [name] MRN: [id number] DOB: [birth year] Today's Date: 03/05/2013 Time: 4098-1191 PT Time Calculation (min): 25 min  PT Assessment / Plan / Recommendation  History of Present Illness 57 year old female patient with history of congestive heart failure coronary artery disease as well as chronic kidney disease. She also has diabetes and chronic anemia related to her kidney disease. She presented to the hospital with complaints of shortness of breath and was found systolic blood pressure greater than 200. Chest x-ray was consistent with possible pulmonary edema. Patient had rapid onset of shortness of breath according to her family and no constitutional symptoms that would lead one to believe she had an infectious process. He over the past several days patient has also had increasing swelling of her extremities. She has had several CHF exacerbations over the past year and has been hospitalized.   PT Comments   Pt admitted with above. Pt currently with functional limitations due to continued deficits with weakness and decr endurance for activity.  Has progressed and should be ok to d/c home and use RW at all times, have assist by family and receive HHPT.   Pt will benefit from skilled PT to increase their independence and safety with mobility to allow discharge to the venue listed below.   Follow Up Recommendations  Home health PT;Supervision/Assistance - 24 hour                 Equipment Recommendations  Other (comment) (obtained 2 legs for pts' walker/pt said one of hers is broke)        Frequency Min 3X/week   Progress towards PT Goals Progress towards PT goals: Progressing toward goals  Plan Current plan remains appropriate    Precautions / Restrictions Precautions Precautions: Fall Restrictions Weight Bearing Restrictions: No   Pertinent Vitals/Pain VSS, No pain    Mobility  Bed Mobility Bed Mobility: Not  assessed Transfers Transfers: Sit to Stand;Stand to Sit Sit to Stand: 5: Supervision;With upper extremity assist;From chair/3-in-1;With armrests Stand to Sit: 4: Min guard;With upper extremity assist;With armrests;To chair/3-in-1 Stand Pivot Transfers: Not tested (comment) Details for Transfer Assistance: Did not need steadying assist. Ambulation/Gait Ambulation/Gait Assistance: 5: Supervision Ambulation Distance (Feet): 250 Feet Assistive device: Rolling walker Ambulation/Gait Assistance Details: did not need cuing for posture or assist to maneuver RW.  Improving.   Gait Pattern: Step-through pattern;Decreased stride length Gait velocity: decreased Stairs: No Wheelchair Mobility Wheelchair Mobility: No    Exercises General Exercises - Upper Extremity Shoulder Flexion: AROM;Both;20 reps;Seated Shoulder ABduction: AROM;Both;15 reps;Seated Elbow Flexion: AROM;Both;20 reps;Seated General Exercises - Lower Extremity Ankle Circles/Pumps: AROM;Both;20 reps;Seated Long Arc Quad: AROM;Both;20 reps;Seated Hip Flexion/Marching: AROM;Both;20 reps;Seated   PT Goals (current goals can now be found in the care plan section)    Visit Information  Last PT Received On: 03/05/13 Assistance Needed: +1 History of Present Illness: 57 year old female patient with history of congestive heart failure coronary artery disease as well as chronic kidney disease. She also has diabetes and chronic anemia related to her kidney disease. She presented to the hospital with complaints of shortness of breath and was found systolic blood pressure greater than 200. Chest x-ray was consistent with possible pulmonary edema. Patient had rapid onset of shortness of breath according to her family and no constitutional symptoms that would lead one to believe she had an infectious process. He over the past several days patient has also had increasing swelling of her extremities. She has had several CHF exacerbations over the  past year and has been hospitalized.    Subjective Data  Subjective: "I need some legs for my walker."   Cognition  Cognition Arousal/Alertness: Awake/alert Behavior During Therapy: WFL for tasks assessed/performed Overall Cognitive Status: Within Functional Limits for tasks assessed    Balance  Dynamic Standing Balance Dynamic Standing - Balance Support: No upper extremity supported;During functional activity Dynamic Standing - Level of Assistance: 5: Stand by assistance Berg Balance Test Sit to Stand: Able to stand without using hands and stabilize independently Standing Unsupported: Able to stand safely 2 minutes Sitting with Back Unsupported but Feet Supported on Floor or Stool: Able to sit safely and securely 2 minutes Stand to Sit: Sits safely with minimal use of hands Transfers: Able to transfer safely, minor use of hands Standing Unsupported with Eyes Closed: Able to stand 10 seconds safely Standing Ubsupported with Feet Together: Able to place feet together independently and stand for 1 minute with supervision From Standing, Reach Forward with Outstretched Arm: Can reach confidently >25 cm (10") From Standing Position, Pick up Object from Floor: Able to pick up shoe safely and easily From Standing Position, Turn to Look Behind Over each Shoulder: Looks behind from both sides and weight shifts well Turn 360 Degrees: Able to turn 360 degrees safely one side only in 4 seconds or less Standing Unsupported, Alternately Place Feet on Step/Stool: Able to stand independently and complete 8 steps >20 seconds Standing Unsupported, One Foot in Front: Needs help to step but can hold 15 seconds Standing on One Leg: Tries to lift leg/unable to hold 3 seconds but remains standing independently Total Score: 47  End of Session PT - End of Session Equipment Utilized During Treatment: Gait belt Activity Tolerance: Patient tolerated treatment well Patient left: in chair;with call bell/phone  within reach Nurse Communication: Mobility status        [name],Madeline Pickart 03/05/2013, 10:10 AM [facility] [phone] (pager)

## 2013-03-05 NOTE — Progress Notes (Signed)
Pt d/c'd home with daughter. D/c instructions d/w pt and daughter, rx provided. Medications, old and new  D/w both. Pt in good spirits, up and about in room with walker. 

## 2013-03-05 NOTE — Discharge Summary (Signed)
[facility]  Physician Discharge Summary   Patient ID: Madeline [name] MRN: [id number] DOB/AGE: [birth year] 55 y.o.  Admit date: 02/14/2013 Discharge date: 03/05/2013  PCP: Pamelia Hoit, MD  DISCHARGE DIAGNOSES:  Active Problems:   ARF (acute renal failure)   Anemia, chronic disease   DM (diabetes mellitus)   Acute respiratory failure with hypoxia   Acute diastolic CHF (congestive heart failure)   HTN (hypertension), malignant   Chronic kidney disease (CKD), stage IV (severe)   Acute blood loss anemia   Anasarca and ascites   Acute right heart failure   OSA on CPAP   Acute on chronic diastolic congestive heart failure   Acute on chronic renal failure   RECOMMENDATIONS FOR OUTPATIENT FOLLOW UP: 1. Needs follow up with Nephrology and Cardiology 2. Home health has been arranged.   DISCHARGE CONDITION: fair  Diet recommendation: Mod Carb  Filed Weights   03/03/13 0557 03/04/13 0446 03/05/13 0508  Weight: 68.584 kg (151 lb 3.2 oz) 68.9 kg (151 lb 14.4 oz) 68.4 kg (150 lb 12.7 oz)    INITIAL HISTORY: 57 year old female patient with history of congestive heart failure coronary artery disease as well as chronic kidney disease. She also has diabetes and chronic anemia related to her kidney disease. She presented to the hospital with complaints of shortness of breath and was found to have systolic blood pressure greater than 200. Chest x-ray was consistent with possible pulmonary edema. Due to the degree of respiratory failure she did require BiPAP in the emergency department. She was also temporarily started on IV nitroglycerin and IV Lasix was given with subsequent improvement in the patient's blood pressure and breathing therefore BiPAP was weaned off. Pt was doing quite well with diuresis but then started having SOB on Wenesday, her lasix was increase and started on zaroxolyn. Her SOB improved and edema along with JVD was resolving. Then again Sunday her JVD increase and  edema worsen. Cardiology was consulted for RHC as was nephrology for ? Proteniuria. She was then dialysed and has improved significantly.    Procedures:  ECHO 02/15/2013  Ejection fraction was 65%. Wall motion was normal; there were no regional wall motion abnormalities. Findings consistent with left ventricular diastolic dysfunction. Doppler parameters are consistent with high ventricular filling pressure   EGD 02/16/13  Scattered gastric ulcerations   Temporary dialysis catheter placement 10/21   Graft/Fistula placement and Permanent dialysis catheter placement 10/24  HOSPITAL COURSE:   Acute respiratory failure with hypoxia due to flash pulmonary edema due to malingnant HTN, Acute biventricular diastolic CHF (congestive heart failure)-stage 3/HTN (hypertension), malignant  The patient had a prolonged hospital stay. She was given intravenous diuretics. Despite that patient did not improve. Subsequently, she was seen by Dr. Gala Romney, with heart failure team. She underwent right heart catheterization, which showed elevated filling pressures with normal cardiac output. She was noted also to have chronic kidney disease. It was felt that most of her fluid overload was due to her kidney failure rather than cardiac issues. So, nephrology was consulted. She was dialyzed with a temporary dialysis catheter. With this she significantly improved. She has lost about 26 kg during this hospital stay. She was also on lasix infusion which was changed 10/23. She was placed on beta blocker hydralazine and imdur. She was also placed on Zaroxolyn. She may require dialysis in the future. However, there is no need for scheduled dialysis at this time. She was discharged on oral Lasix.  Chronic kidney disease (CKD), stage IV (severe)  As above. Vascular surgery was consulted and permanent access has been placed. Venous fistula has been created but that will take time to mature. Determination regarding long-term  dialysis to be made at followup with nephrology.   Anemia, chronic disease/Acute blood loss anemia  She has anemia of chronic disease. After vascular access was placed she had some oozing around the catheter site. It required a lot of pressure to control the oozing. As a result of this her hemoglobin dropped some and she was transfused blood yesterday. Hemoglobin has responded nicely. And now is back at baseline. And the bleeding around the catheter site has also stopped. She also required blood transfusion earlier in this hospitalization. She underwent EGD which showed multiple focal antral ulcers. She was placed on PPI.    DM (diabetes mellitus)  She was on Lantus insulin for diabetes management. This will be continued at home. HbA1c was 7.7.  Accelerated HTN She was discharged on multiple antihypertensive agents. Pressure still remains elevated, however, will take weeks for optimal control. This can be followed up as an outpatient.   Home health was arranged per PT and OT recommendations. She was clear for discharge. Nephrology and cardiology. She went home with her daughter. She did not require home oxygen.    PERTINENT LABS:  The results of significant diagnostics from this hospitalization (including imaging, microbiology, ancillary and laboratory) are listed below for reference.    Labs: Basic Metabolic Panel:  Recent Labs Lab 02/27/13 0631 02/28/13 0530  03/01/13 0620 03/02/13 0430 03/03/13 0815 03/04/13 0410 03/05/13 0410  NA 141 142  --  140 142 142 144 142  K 3.3* 3.8  --  3.8 3.5 3.3* 3.7 3.8  CL 103 103  --  101 103 102 106 103  CO2 27 29  --  27 30 31 28 29   GLUCOSE 125* 91  --  283* 223* 115* 116* 215*  BUN 84* 89*  --  57* 36* 46* 47* 53*  CREATININE 2.85* 2.99*  < > 2.07* 1.73* 2.29* 2.38* 2.49*  CALCIUM 8.0* 7.9*  --  8.2* 7.6* 7.6* 7.5* 8.0*  MG  --  1.8  --   --   --   --   --   --   PHOS  --  4.5  --   --   --  3.8 4.7* 4.5  < > = values in this interval  not displayed. Liver Function Tests:  Recent Labs Lab 02/28/13 0530 03/03/13 0815 03/04/13 0410 03/05/13 0410  AST 59*  --   --   --   ALT 43*  --   --   --   ALKPHOS 108  --   --   --   BILITOT 0.1*  --   --   --   PROT 5.4*  --   --   --   ALBUMIN 1.7* 1.8* 1.7* 1.9*   CBC:  Recent Labs Lab 03/01/13 0620 03/02/13 0430 03/03/13 0815 03/04/13 0410 03/05/13 0410  WBC 8.5 7.6 7.9 7.4 9.4  HGB 8.5* 8.2* 7.4* 6.4* 8.2*  HCT 25.7* 24.8* 23.0* 19.8* 24.9*  MCV 88.9 89.9 89.5 91.7 90.5  PLT 244 203 201 173 180   BNP: BNP (last 3 results)  Recent Labs  02/14/13 2333 02/22/13 0709 02/26/13 0546  PROBNP 8501.0* 5090.0* 7568.0*   CBG:  Recent Labs Lab 03/04/13 2115 03/04/13 2128 03/04/13 2256 03/05/13 0611 03/05/13 1120  GLUCAP 54* 71 117* 246* 244*     IMAGING  STUDIES Ct Abdomen Pelvis Wo Contrast  02/15/2013   CLINICAL DATA:  Low back pain, anemia. Clinical concern for hydronephrosis.  EXAM: CT ABDOMEN AND PELVIS WITHOUT CONTRAST  TECHNIQUE: Multidetector CT imaging of the abdomen and pelvis was performed following the standard protocol without intravenous contrast.  COMPARISON:  None.  FINDINGS: Unenhanced CT was performed per clinician order. Lack of IV contrast limits sensitivity and specificity, especially for evaluation of abdominal/pelvic solid viscera. Exam detail is suboptimal due to patient body habitus. Trace pleural effusions are partly visualized. Cardiomegaly noted. There is diffuse soft tissue edema as well as trace ascites. Motion artifact persists throughout the length of the scan with extensive streak artifact from the patient's arms.  Abdominal viscera are grossly unremarkable. There is subjective fullness in the porta hepatis region but this is poorly evaluated at the current suboptimal quality exam. No gross evidence for bowel dilatation. Small amount of free pelvic fluid is identified. The bladder is decompressed with a Foley catheter in place.  Colonic diverticuli noted without evidence for diverticulitis. No small bowel dilatation. Degenerative changes are noted in the spine.  IMPRESSION: Extremely suboptimal exam due to patient motion, habitus, and arm positioning at the sides. No gross evidence for hydronephrosis as per the clinical question or other acute intra-abdominal abnormality. Third spacing with soft tissue edema, pleural effusions, and ascites.   Electronically Signed   By: Christiana Pellant M.D.   On: 02/15/2013 12:48   US Abdomen Complete  02/28/2013   CLINICAL DATA:  Acute kidney injury. Hypoalbuminemia and anasarca with ascites.  EXAM: ULTRASOUND ABDOMEN COMPLETE  COMPARISON:  02/15/2013  FINDINGS: Gallbladder  No gallstones or wall thickening visualized. No sonographic Murphy sign noted.  Common bile duct  Diameter: 8.6 mm. No duct stone is seen.  Liver  No focal lesion identified. Within normal limits in parenchymal echogenicity.  IVC  No abnormality visualized.  Pancreas  Visualized portion unremarkable.  Spleen  Spleen is borderline enlarged with a volume of 383 mL. No splenic mass or focal lesion.  Right Kidney  Length: 9.6 cm. Increased renal parenchymal echogenicity. No mass or hydronephrosis.  Left Kidney  Length: 11.7 cm. Increased renal parenchymal echogenicity. No mass or hydronephrosis.  Abdominal aorta  No aneurysm visualized.  Small amount of ascites is seen adjacent to the liver and spleen and in the perinephric spaces.  IMPRESSION: 1. Echogenic kidneys consistent with medical renal disease. No hydronephrosis. 2. Borderline splenomegaly of unclear etiology. 3. Dilation of the common bile duct to 8.6 mm. No duct stone is seen. There is no pancreatic mass. This may be chronic. 4. Small amount of ascites. 5. No other abnormalities. No acute findings.   Electronically Signed   By: Amie Portland M.D.   On: 02/28/2013 12:12   Dg Chest Port 1 View  03/03/2013   CLINICAL DATA:  End-stage renal disease.  Catheter placement.   EXAM: PORTABLE CHEST - 1 VIEW  COMPARISON:  02/26/2013  FINDINGS: Stable enlarged cardiac silhouette. Interval placement of a right-sided dialysis catheter with split tips in the right atrium. No pneumothorax. Mild pulmonary edema is present.  IMPRESSION: Placement of a right dialysis catheter with tips in the right atrium. No pneumothorax.  Cardiomegaly and mild pulmonary edema   Electronically Signed   By: Genevive Bi M.D.   On: 03/03/2013 12:12   Dg Chest Port 1 View  02/26/2013   CLINICAL DATA:  Short of breath. History of congestive heart failure.  EXAM: PORTABLE CHEST - 1 VIEW  COMPARISON:  02/14/2013.  FINDINGS: Cardiac silhouette is mildly enlarged. The mediastinum is normal in contour.  The lungs are clear. Irregular interstitial thickening seen previously has resolved. No pleural effusion or pneumothorax.  The bony thorax is intact.  IMPRESSION: Presumed pulmonary edema seen previously has resolved. There is now no acute cardiopulmonary disease.   Electronically Signed   By: Amie Portland M.D.   On: 02/26/2013 08:10   Dg Chest Port 1 View  02/14/2013   *RADIOLOGY REPORT*  Clinical Data: Shortness of breath  PORTABLE CHEST - 1 VIEW  Comparison: 04/05/2012  Findings:  Grossly unchanged enlarged cardiac silhouette and mediastinal contours.  Atherosclerotic calcifications within the thoracic aorta.  Pulmonary vasculature is indistinct with cephalization of flow.  Worsening perihilar and bibasilar heterogeneous opacities, left greater than right.  No definite pleural effusion or pneumothorax.  Grossly unchanged bones.  IMPRESSION:  Overall findings most worrisome for pulmonary edema, though note, atypical infection may have a similar appearance.  A follow-up chest radiograph in 4 to 6 weeks after treatment is recommended to ensure resolution.   Original Report Authenticated By: Tacey Ruiz, MD   Dg Fluoro Guide Cv Line-no Report  03/03/2013   CLINICAL DATA: dialysis cath   FLOURO GUIDE CV LINE   Fluoroscopy was utilized by the requesting physician.  No radiographic  interpretation.     DISCHARGE EXAMINATION: Filed Vitals:   03/04/13 2325 03/05/13 0508 03/05/13 0525 03/05/13 0956  BP:   178/58 172/68  Pulse: 70 66 89   Temp:  98.3 F (36.8 C)    TempSrc:  Oral    Resp: 16 18    Height:      Weight:  68.4 kg (150 lb 12.7 oz)    SpO2:  96%     General appearance: alert, cooperative, appears stated age and no distress Resp: clear to auscultation bilaterally Cardio: regular rate and rhythm, S1, S2 normal, no murmur, click, rub or gallop Neurologic: Alert and oriented X 3, normal strength and tone. Normal symmetric reflexes. Normal coordination and gait  DISPOSITION: Home with home health  Discharge Orders   Future Orders Complete By Expires   Call MD for:  As directed    Comments:     Chest pain, shortness of breath, dizziness, weight gain   Diet Carb Modified  As directed    Discharge instructions  As directed    Comments:     Weigh yourself daily. Call Dr. Arrie Aran if weight increases by 2 lbs.   Increase activity slowly  As directed       ALLERGIES: No Known Allergies  Current Discharge Medication List    START taking these medications   Details  amLODipine (NORVASC) 10 MG tablet Take 1 tablet (10 mg total) by mouth daily. Qty: 30 tablet, Refills: 2    carvedilol (COREG CR) 40 MG 24 hr capsule Take 1 capsule (40 mg total) by mouth daily. Qty: 30 capsule, Refills: 2    cloNIDine (CATAPRES) 0.1 MG tablet Take 1 tablet (0.1 mg total) by mouth daily. Qty: 30 tablet, Refills: 2    insulin glargine (LANTUS) 100 UNIT/ML injection Inject 0.2 mLs (20 Units total) into the skin at bedtime. Qty: 10 mL, Refills: 12    metolazone (ZAROXOLYN) 5 MG tablet Take 1 tablet (5 mg total) by mouth 2 (two) times daily. Qty: 60 tablet, Refills: 1    polyethylene glycol (MIRALAX / GLYCOLAX) packet Take 17 g by mouth daily as needed (for constipation). Qty: 14 each,  Refills:  0      CONTINUE these medications which have CHANGED   Details  furosemide (LASIX) 40 MG tablet Take 1 tablet (40 mg total) by mouth 2 (two) times daily. Qty: 60 tablet, Refills: 1    gabapentin (NEURONTIN) 300 MG capsule Take 1 capsule (300 mg total) by mouth at bedtime.    hydrALAZINE (APRESOLINE) 100 MG tablet Take 1 tablet (100 mg total) by mouth 3 (three) times daily. Qty: 90 tablet, Refills: 2      CONTINUE these medications which have NOT CHANGED   Details  albuterol (PROVENTIL HFA;VENTOLIN HFA) 108 (90 BASE) MCG/ACT inhaler Inhale 2 puffs into the lungs every 6 (six) hours as needed for wheezing or shortness of breath.     aspirin EC 81 MG tablet Take 81 mg by mouth daily.    ferrous sulfate 325 (65 FE) MG tablet Take 325 mg by mouth daily with breakfast.    insulin lispro (HUMALOG) 100 UNIT/ML injection Inject 0-10 Units into the skin 3 (three) times daily before meals. Pt on sliding scale    isosorbide mononitrate (IMDUR) 120 MG 24 hr tablet Take 120 mg by mouth daily.    nitroGLYCERIN (NITROSTAT) 0.4 MG SL tablet Place 0.4 mg under the tongue every 5 (five) minutes as needed for chest pain.     omeprazole (PRILOSEC) 20 MG capsule Take 20 mg by mouth daily.    traMADol (ULTRAM) 50 MG tablet Take 50 mg by mouth 3 (three) times daily as needed for pain.      STOP taking these medications     NIFEdipine (PROCARDIA XL/ADALAT-CC) 60 MG 24 hr tablet        Follow-up Information   Follow up with [facility]. (Home health nurse)    Contact information:   Home health has been ordered for you and set up.  They will call within 24-48hours after discharge.  If they do NOT call, you may contact:  [phone]      Follow up with COLADONATO,JOSEPH A, MD. (his office should call you with appointment)    Specialty:  Nephrology   Contact information:   [address] [facility] [place] [address] [phone]       Follow up with Arvilla Meres,  MD. Schedule an appointment as soon as possible for a visit in 3 weeks. (call for appointment)    Specialty:  Cardiology   Contact information:   [address] [place] [address] [phone]       Follow up with Pamelia Hoit, MD. Schedule an appointment as soon as possible for a visit in 1 week.   Specialty:  Family Medicine   Contact information:   [address] [place] [address] [phone]       TOTAL DISCHARGE TIME: 35 mins  [facility]  [facility] Pager [phone]  03/05/2013, 11:48 AM

## 2013-03-05 NOTE — Progress Notes (Deleted)
Pt d/c'd home with daughter. D/c instructions d/w pt and daughter, rx provided. Medications, old and new  D/w both. Pt in good spirits, up and about in room with walker. 

## 2013-03-05 NOTE — Progress Notes (Signed)
Patient ID: Madeline [name], female   DOB: 1956-01-15, 57 y.o.   MRN: [id number] S:feels better O:BP 172/68  Pulse 89  Temp(Src) 98.3 F (36.8 C) (Oral)  Resp 18  Ht 4\' 9"  (1.448 m)  Wt 68.4 kg (150 lb 12.7 oz)  BMI 32.62 kg/m2  SpO2 96%  Intake/Output Summary (Last 24 hours) at 03/05/13 1058 Last data filed at 03/05/13 0900  Gross per 24 hour  Intake    920 ml  Output   1725 ml  Net   -805 ml   Intake/Output: I/O last 3 completed shifts: In: 950 [P.O.:900; Blood:50] Out: 2415 [Urine:1900; Other:515]  Intake/Output this shift:  Total I/O In: 480 [P.O.:480] Out: 300 [Urine:300] Weight change: -0.5 kg (-1 lb 1.6 oz) Gen: WD WN AAF in NAD CVS:no rub Resp:cta WUJ:WJXBJY Ext:tr pretib edema, LUE AVF +T/B and edema, RIJ PC   Recent Labs Lab 02/27/13 0631 02/28/13 0530 02/28/13 1530 03/01/13 0620 03/02/13 0430 03/03/13 0815 03/04/13 0410 03/05/13 0410  NA 141 142  --  140 142 142 144 142  K 3.3* 3.8  --  3.8 3.5 3.3* 3.7 3.8  CL 103 103  --  101 103 102 106 103  CO2 27 29  --  27 30 31 28 29   GLUCOSE 125* 91  --  283* 223* 115* 116* 215*  BUN 84* 89*  --  57* 36* 46* 47* 53*  CREATININE 2.85* 2.99* 3.02* 2.07* 1.73* 2.29* 2.38* 2.49*  ALBUMIN  --  1.7*  --   --   --  1.8* 1.7* 1.9*  CALCIUM 8.0* 7.9*  --  8.2* 7.6* 7.6* 7.5* 8.0*  PHOS  --  4.5  --   --   --  3.8 4.7* 4.5  AST  --  59*  --   --   --   --   --   --   ALT  --  43*  --   --   --   --   --   --    Liver Function Tests:  Recent Labs Lab 02/28/13 0530 03/03/13 0815 03/04/13 0410 03/05/13 0410  AST 59*  --   --   --   ALT 43*  --   --   --   ALKPHOS 108  --   --   --   BILITOT 0.1*  --   --   --   PROT 5.4*  --   --   --   ALBUMIN 1.7* 1.8* 1.7* 1.9*   No results found for this basename: LIPASE, AMYLASE,  in the last 168 hours No results found for this basename: AMMONIA,  in the last 168 hours CBC:  Recent Labs Lab 03/01/13 0620 03/02/13 0430 03/03/13 0815 03/04/13 0410 03/05/13 0410   WBC 8.5 7.6 7.9 7.4 9.4  HGB 8.5* 8.2* 7.4* 6.4* 8.2*  HCT 25.7* 24.8* 23.0* 19.8* 24.9*  MCV 88.9 89.9 89.5 91.7 90.5  PLT 244 203 201 173 180   Cardiac Enzymes: No results found for this basename: CKTOTAL, CKMB, CKMBINDEX, TROPONINI,  in the last 168 hours CBG:  Recent Labs Lab 03/04/13 2100 03/04/13 2115 03/04/13 2128 03/04/13 2256 03/05/13 0611  GLUCAP 68* 54* 71 117* 246*    Iron Studies: No results found for this basename: IRON, TIBC, TRANSFERRIN, FERRITIN,  in the last 72 hours Studies/Results: Dg Chest Port 1 View  03/03/2013   CLINICAL DATA:  End-stage renal disease.  Catheter placement.  EXAM: PORTABLE CHEST - 1 VIEW  COMPARISON:  02/26/2013  FINDINGS: Stable enlarged cardiac silhouette. Interval placement of a right-sided dialysis catheter with split tips in the right atrium. No pneumothorax. Mild pulmonary edema is present.  IMPRESSION: Placement of a right dialysis catheter with tips in the right atrium. No pneumothorax.  Cardiomegaly and mild pulmonary edema   Electronically Signed   By: Genevive Bi M.D.   On: 03/03/2013 12:12   Dg Fluoro Guide Cv Line-no Report  03/03/2013   CLINICAL DATA: dialysis cath   FLOURO GUIDE CV LINE  Fluoroscopy was utilized by the requesting physician.  No radiographic  interpretation.    Marland Kitchen amLODipine  10 mg Oral Daily  . aspirin EC  81 mg Oral Daily  . carvedilol  40 mg Oral Daily  . cloNIDine  0.1 mg Oral Daily  . ferrous sulfate  325 mg Oral TID WC  . furosemide  40 mg Intravenous BID  . gabapentin  100 mg Oral BID  . heparin  5,000 Units Subcutaneous Q8H  . hydrALAZINE  100 mg Oral TID  . insulin aspart  0-5 Units Subcutaneous QHS  . insulin aspart  0-9 Units Subcutaneous TID WC  . insulin aspart  8 Units Subcutaneous TID WC  . insulin glargine  20 Units Subcutaneous QHS  . isosorbide mononitrate  120 mg Oral Daily  . metolazone  5 mg Oral BID  . pantoprazole  40 mg Oral BID  . polyethylene glycol  17 g Oral Daily  .  sodium chloride  3 mL Intravenous Q12H  . sodium chloride  3 mL Intravenous Q12H    BMET    Component Value Date/Time   NA 142 03/05/2013 0410   K 3.8 03/05/2013 0410   CL 103 03/05/2013 0410   CO2 29 03/05/2013 0410   GLUCOSE 215* 03/05/2013 0410   BUN 53* 03/05/2013 0410   CREATININE 2.49* 03/05/2013 0410   CALCIUM 8.0* 03/05/2013 0410   GFRNONAA 21* 03/05/2013 0410   GFRAA 24* 03/05/2013 0410   CBC    Component Value Date/Time   WBC 9.4 03/05/2013 0410   RBC 2.75* 03/05/2013 0410   RBC 2.79* 02/18/2013 1345   HGB 8.2* 03/05/2013 0410   HCT 24.9* 03/05/2013 0410   PLT 180 03/05/2013 0410   MCV 90.5 03/05/2013 0410   MCH 29.8 03/05/2013 0410   MCHC 32.9 03/05/2013 0410   RDW 16.4* 03/05/2013 0410   LYMPHSABS 1.1 02/14/2013 1853   MONOABS 0.6 02/14/2013 1853   EOSABS 0.1 02/14/2013 1853   BASOSABS 0.0 02/14/2013 1853     Impression:  1. CAD s/p NSTEMI 2. Decompensated diastolic CHF 3. AKI/CKD 4. Proteinuria 5. DM 6. OSA 7. Pulm HTN 8. Poorly controlled HTN 9. ABLA 10. Anemia of chronic disease 11. SHPTH Assessment/Plan:  1. CHF- preserved CO  1. Markedly improved volume following HD with UF and now with increase UOP to diuretics 2. Feels better.  2. AKI/CKD- s/p 2 sessions of HD with marked improvement with improved hemodynamics and volume status.  She does not require initiation of outpt HD at this time, however she does has advanced CKD at baseline.  We discussed the ways to delay the progression of CKD with:  1. Tight BP control with goal BP <130/80 2. Tight glucose control with goal Hgb A1c <7% 3. Use of an ace/arb (would not in her case given frequent episodes of AKI/CKD) 4. Avoidance of nephrotoxic agents such as NSAIDs/COX-II I's and IV contrast 5. Na and fluid restriction to prevent Acute on  chronic CHF exacerbations which in turn cause AKI/CKD 3. HTN- not optimal. Agree with primary svc that bradycardia is limiting agents. started amlodipine even  though this will not help with proteinuria and titrate other meds as needed and HR allows.  1. Would also add isosorbide 20mg  bid (Bidil) to help with BP  2. F/u with PCP Dr. Benedetto Goad at [facility] in [place] and Dr. Gala Romney  4. ABLA/ACDz- receiving blood transfusion. Will follow H/H. Will check SPEP/UPEP, serologies, and iron stores 5. CKD-MBD- elevated iPTH, will start calcitriol 0.22mcg daily. 6. Anasarca/ascites- as above 7. CAD s/p NSTEMI 8. Pulm HTN- recommend CPAP compliance 9. Vascular access- s/p LUE AVF placement by Dr. Arbie Cookey 03/03/13 as well as tunneled HD catheter.  If renal function continues to improve/stabilize, will d/c perm cath 10. dispo- will follow renal function and diuresis. Will arrange for oupt follow up in the next 2-4 weeks and follow renal function through her PCP. 11.   Madeline [name] A

## 2013-03-06 ENCOUNTER — Encounter (HOSPITAL_COMMUNITY): Payer: Self-pay | Admitting: Vascular Surgery

## 2013-03-06 LAB — UIFE/LIGHT CHAINS/TP QN, 24-HR UR
Alpha 1, Urine: DETECTED — AB
Alpha 2, Urine: DETECTED — AB
Free Kappa Lt Chains,Ur: 13.3 mg/dL — ABNORMAL HIGH (ref 0.14–2.42)
Free Kappa/Lambda Ratio: 5.59 ratio (ref 2.04–10.37)
Free Lambda Excretion/Day: 52.36 mg/d
Free Lambda Lt Chains,Ur: 2.38 mg/dL — ABNORMAL HIGH (ref 0.02–0.67)
Free Lt Chn Excr Rate: 292.6 mg/d
Total Protein, Urine-Ur/day: 3956 mg/d — ABNORMAL HIGH (ref 10–140)
Total Protein, Urine: 179.8 mg/dL
Volume, Urine: 2200 mL

## 2013-03-07 LAB — POCT I-STAT 4, (NA,K, GLUC, HGB,HCT)
Glucose, Bld: 124 mg/dL — ABNORMAL HIGH (ref 70–99)
Hemoglobin: 8.8 g/dL — ABNORMAL LOW (ref 12.0–15.0)
Potassium: 3.3 mEq/L — ABNORMAL LOW (ref 3.5–5.1)

## 2013-03-07 LAB — VITAMIN D 1,25 DIHYDROXY: Vitamin D3 1, 25 (OH)2: <8 pg/mL

## 2013-03-08 LAB — TYPE AND SCREEN
ABO/RH(D): O POS
DAT, IgG: NEGATIVE
Unit division: 0
Unit division: 0

## 2013-03-09 ENCOUNTER — Telehealth (HOSPITAL_COMMUNITY): Payer: Self-pay | Admitting: *Deleted

## 2013-03-09 NOTE — Telephone Encounter (Signed)
Received call from [facility] RN she states pt's wt is up 3 lb from yesterday, LE edema about the same for pt may be a little worse, VS all stable, she reports pt is taking all meds including metolazone 5 mg Twice daily and lasix 80 mg daily, discussed w/Amy Filbert Schilder, NP will have pt take extra 80 mg lasix today, if not improving should f/u with kidney MD, RN aware and agreeable

## 2013-03-10 LAB — ALDOSTERONE + RENIN ACTIVITY W/ RATIO: PRA LC/MS/MS: 0.19 ng/mL/h — ABNORMAL LOW (ref 0.25–5.82)

## 2013-03-15 ENCOUNTER — Emergency Department (HOSPITAL_COMMUNITY): Payer: Medicaid - Out of State

## 2013-03-15 ENCOUNTER — Inpatient Hospital Stay (HOSPITAL_COMMUNITY)
Admission: EM | Admit: 2013-03-15 | Discharge: 2013-03-18 | DRG: 377 | Disposition: A | Discharge status: Home / Self Care | Payer: Medicaid - Out of State | Attending: Internal Medicine | Admitting: Internal Medicine

## 2013-03-15 ENCOUNTER — Encounter (HOSPITAL_COMMUNITY): Payer: Self-pay | Admitting: Emergency Medicine

## 2013-03-15 ENCOUNTER — Inpatient Hospital Stay (HOSPITAL_COMMUNITY): Payer: Medicaid - Out of State

## 2013-03-15 DIAGNOSIS — E119 Type 2 diabetes mellitus without complications: Secondary | ICD-10-CM | POA: Diagnosis present

## 2013-03-15 DIAGNOSIS — Z79899 Other long term (current) drug therapy: Secondary | ICD-10-CM

## 2013-03-15 DIAGNOSIS — J449 Chronic obstructive pulmonary disease, unspecified: Secondary | ICD-10-CM | POA: Diagnosis present

## 2013-03-15 DIAGNOSIS — D631 Anemia in chronic kidney disease: Secondary | ICD-10-CM | POA: Diagnosis present

## 2013-03-15 DIAGNOSIS — I2510 Atherosclerotic heart disease of native coronary artery without angina pectoris: Secondary | ICD-10-CM | POA: Diagnosis present

## 2013-03-15 DIAGNOSIS — E162 Hypoglycemia, unspecified: Secondary | ICD-10-CM

## 2013-03-15 DIAGNOSIS — I5032 Chronic diastolic (congestive) heart failure: Secondary | ICD-10-CM | POA: Diagnosis present

## 2013-03-15 DIAGNOSIS — Z7982 Long term (current) use of aspirin: Secondary | ICD-10-CM

## 2013-03-15 DIAGNOSIS — I129 Hypertensive chronic kidney disease with stage 1 through stage 4 chronic kidney disease, or unspecified chronic kidney disease: Secondary | ICD-10-CM | POA: Diagnosis present

## 2013-03-15 DIAGNOSIS — Z87891 Personal history of nicotine dependence: Secondary | ICD-10-CM

## 2013-03-15 DIAGNOSIS — Z9851 Tubal ligation status: Secondary | ICD-10-CM

## 2013-03-15 DIAGNOSIS — N185 Chronic kidney disease, stage 5: Secondary | ICD-10-CM | POA: Diagnosis present

## 2013-03-15 DIAGNOSIS — D62 Acute posthemorrhagic anemia: Secondary | ICD-10-CM | POA: Diagnosis present

## 2013-03-15 DIAGNOSIS — D638 Anemia in other chronic diseases classified elsewhere: Secondary | ICD-10-CM | POA: Diagnosis present

## 2013-03-15 DIAGNOSIS — E1169 Type 2 diabetes mellitus with other specified complication: Secondary | ICD-10-CM | POA: Diagnosis present

## 2013-03-15 DIAGNOSIS — Z794 Long term (current) use of insulin: Secondary | ICD-10-CM

## 2013-03-15 DIAGNOSIS — I2789 Other specified pulmonary heart diseases: Secondary | ICD-10-CM | POA: Diagnosis present

## 2013-03-15 DIAGNOSIS — J4489 Other specified chronic obstructive pulmonary disease: Secondary | ICD-10-CM | POA: Diagnosis present

## 2013-03-15 DIAGNOSIS — R1013 Epigastric pain: Secondary | ICD-10-CM

## 2013-03-15 DIAGNOSIS — Z8249 Family history of ischemic heart disease and other diseases of the circulatory system: Secondary | ICD-10-CM

## 2013-03-15 DIAGNOSIS — K219 Gastro-esophageal reflux disease without esophagitis: Secondary | ICD-10-CM | POA: Diagnosis present

## 2013-03-15 DIAGNOSIS — K922 Gastrointestinal hemorrhage, unspecified: Secondary | ICD-10-CM

## 2013-03-15 DIAGNOSIS — I509 Heart failure, unspecified: Secondary | ICD-10-CM | POA: Diagnosis present

## 2013-03-15 DIAGNOSIS — N170 Acute kidney failure with tubular necrosis: Secondary | ICD-10-CM | POA: Diagnosis present

## 2013-03-15 DIAGNOSIS — D696 Thrombocytopenia, unspecified: Secondary | ICD-10-CM | POA: Diagnosis present

## 2013-03-15 DIAGNOSIS — R079 Chest pain, unspecified: Secondary | ICD-10-CM

## 2013-03-15 DIAGNOSIS — G4733 Obstructive sleep apnea (adult) (pediatric): Secondary | ICD-10-CM | POA: Diagnosis present

## 2013-03-15 DIAGNOSIS — Z833 Family history of diabetes mellitus: Secondary | ICD-10-CM

## 2013-03-15 DIAGNOSIS — K254 Chronic or unspecified gastric ulcer with hemorrhage: Principal | ICD-10-CM | POA: Diagnosis present

## 2013-03-15 DIAGNOSIS — R680 Hypothermia, not associated with low environmental temperature: Secondary | ICD-10-CM | POA: Diagnosis present

## 2013-03-15 DIAGNOSIS — T68XXXA Hypothermia, initial encounter: Secondary | ICD-10-CM

## 2013-03-15 DIAGNOSIS — N184 Chronic kidney disease, stage 4 (severe): Secondary | ICD-10-CM | POA: Diagnosis present

## 2013-03-15 DIAGNOSIS — I252 Old myocardial infarction: Secondary | ICD-10-CM

## 2013-03-15 LAB — CBC WITH DIFFERENTIAL/PLATELET
Eosinophils Absolute: 0.1 10*3/uL (ref 0.0–0.7)
Hemoglobin: 6.6 g/dL — CL (ref 12.0–15.0)
Lymphocytes Relative: 8 % — ABNORMAL LOW (ref 12–46)
Lymphs Abs: 0.6 10*3/uL — ABNORMAL LOW (ref 0.7–4.0)
MCH: 30.8 pg (ref 26.0–34.0)
MCV: 91.1 fL (ref 78.0–100.0)
Monocytes Relative: 8 % (ref 3–12)
Neutro Abs: 6.1 10*3/uL (ref 1.7–7.7)
Neutrophils Relative %: 83 % — ABNORMAL HIGH (ref 43–77)
RBC: 2.14 MIL/uL — ABNORMAL LOW (ref 3.87–5.11)

## 2013-03-15 LAB — GLUCOSE, CAPILLARY
Glucose-Capillary: 113 mg/dL — ABNORMAL HIGH (ref 70–99)
Glucose-Capillary: 160 mg/dL — ABNORMAL HIGH (ref 70–99)
Glucose-Capillary: 152 mg/dL — ABNORMAL HIGH (ref 70–99)

## 2013-03-15 LAB — URINALYSIS, ROUTINE W REFLEX MICROSCOPIC
Glucose, UA: NEGATIVE mg/dL
Ketones, ur: NEGATIVE mg/dL
Leukocytes, UA: NEGATIVE
Specific Gravity, Urine: 1.011 (ref 1.005–1.030)
pH: 5.5 (ref 5.0–8.0)

## 2013-03-15 LAB — COMPREHENSIVE METABOLIC PANEL
ALT: 27 U/L (ref 0–35)
AST: 56 U/L — ABNORMAL HIGH (ref 0–37)
BUN: 88 mg/dL — ABNORMAL HIGH (ref 6–23)
Calcium: 8 mg/dL — ABNORMAL LOW (ref 8.4–10.5)
GFR calc Af Amer: 16 mL/min — ABNORMAL LOW (ref >90)
Glucose, Bld: 123 mg/dL — ABNORMAL HIGH (ref 70–99)
Sodium: 134 mEq/L — ABNORMAL LOW (ref 135–145)
Total Protein: 5.9 g/dL — ABNORMAL LOW (ref 6.0–8.3)

## 2013-03-15 LAB — URINE MICROSCOPIC-ADD ON

## 2013-03-15 LAB — PREPARE RBC (CROSSMATCH)

## 2013-03-15 LAB — TROPONIN I
Troponin I: <0.3 ng/mL (ref <0.30)
Troponin I: <0.3 ng/mL (ref <0.30)

## 2013-03-15 LAB — LIPASE, BLOOD: Lipase: 28 U/L (ref 11–59)

## 2013-03-15 MED ORDER — ONDANSETRON HCL 4 MG PO TABS: 4.0000 mg | ORAL_TABLET | Freq: Four times a day (QID) | ORAL | Status: DC | PRN

## 2013-03-15 MED ORDER — SODIUM CHLORIDE 0.9 % IV BOLUS (SEPSIS)
250.0000 mL | Freq: Once | INTRAVENOUS | Status: AC
  Administered 2013-03-15: 250 mL via INTRAVENOUS

## 2013-03-15 MED ORDER — INSULIN ASPART 100 UNIT/ML ~~LOC~~ SOLN
0.0000 [IU] | Freq: Three times a day (TID) | SUBCUTANEOUS | Status: DC
  Administered 2013-03-16: 3 [IU] via SUBCUTANEOUS
  Administered 2013-03-16 (×2): 1 [IU] via SUBCUTANEOUS
  Administered 2013-03-17: 5 [IU] via SUBCUTANEOUS
  Administered 2013-03-17: 3 [IU] via SUBCUTANEOUS
  Administered 2013-03-17: 9 [IU] via SUBCUTANEOUS
  Administered 2013-03-18: 2 [IU] via SUBCUTANEOUS

## 2013-03-15 MED ORDER — ONDANSETRON HCL 4 MG/2ML IJ SOLN
4.0000 mg | Freq: Once | INTRAMUSCULAR | Status: AC
  Administered 2013-03-15: 4 mg via INTRAVENOUS
  Filled 2013-03-15: qty 2

## 2013-03-15 MED ORDER — ONDANSETRON HCL 4 MG/2ML IJ SOLN: 4.0000 mg | Freq: Four times a day (QID) | INTRAMUSCULAR | Status: DC | PRN

## 2013-03-15 MED ORDER — SODIUM CHLORIDE 0.9 % IV SOLN
INTRAVENOUS | Status: DC
  Administered 2013-03-15: 14:00:00 via INTRAVENOUS

## 2013-03-15 MED ORDER — PANTOPRAZOLE SODIUM 40 MG IV SOLR
40.0000 mg | Freq: Two times a day (BID) | INTRAVENOUS | Status: DC
  Administered 2013-03-15 – 2013-03-16 (×2): 40 mg via INTRAVENOUS
  Filled 2013-03-15 (×3): qty 40

## 2013-03-15 NOTE — ED Provider Notes (Addendum)
CSN: [id number]     Arrival date & time 03/15/13  1309 History   First MD Initiated Contact with Patient 03/15/13 1316     Chief Complaint  Patient presents with  . Hypoglycemia  . Chest Pain   (Consider location/radiation/quality/duration/timing/severity/associated sxs/prior Treatment) Patient is a 57 y.o. female presenting with hypoglycemia and chest pain. The history is provided by the patient and the EMS personnel. The history is limited by the condition of the patient.  Hypoglycemia Chest Pain  level V caveat applies due to the patient's altered metal status.  EMS was called for evaluation of hypoglycemia and chest pain. Upon arrival EMS stated patient was sitting on the bedside commode and found to be lethargic and altered mental status. Blood sugar upon EMS arrival was 60. Per family member patient's blood sugars are normally 100 to 160s. Patient was cool and slightly diaphoretic. Patient was given 25 g of dextrose pressure came up 113. Patient still lethargic. Patient was grabbing at her chest and crying out stating he was pain. Patient stated that chest pain was 10 out of 10. Patient was given one nitroglycerin in route by EMS pain went down to 4/10. Vessel with patient patient is denying any chest pain. Patient recently had shunt started. Not clear patient is a she started dialysis. Patient was just discharged from the hospital on Saturday for the shunt placement. She was admitted at that time for shortness of breath and congestive heart failure. Patient will not answer questions.  Past Medical History  Diagnosis Date  . CHF (congestive heart failure)     a. HFpEF with RHF/anasarca/pHTN.  . COPD (chronic obstructive pulmonary disease)   . Coronary artery disease     a. Per [place] records: NSTEMI 01/2012, tx medically given ARF but suspected CAD. b. Stress test 12/16/11 reported w/o ischemia.  . Hypertension   . Diabetes mellitus   . OSA (obstructive sleep apnea)     a. Pt reported used  to use CPAP in [place], but "ran out" when came to [facility].  Marland Kitchen CKD (chronic kidney disease), stage III     a. Per [place] records: h/o ARF after CTA that ruled out PE.  Marland Kitchen Pulmonary hypertension     a. [facility] 02/28/13: mod pulm HTN with normal PVR suggestive of predominantly pulmonary venous HTN.  . Right heart failure   . Anasarca     a. Per [place] records - due to pulm HTN with R HF.   Marland Kitchen Aseptic meningitis     a. 09/2012: adm in [place] for metabolic encephalopathy, oliguric tubular necrosis, anemia, HTN, possible CVA, HHNKA.  Marland Kitchen CVA (cerebral infarction)     a. Per [place] records, "possible CVA" 09/2012 but MRI reportedly negative.  . Abnormal Doppler ultrasound of carotid artery     a. Per [place] records: <50% LICA.   Past Surgical History  Procedure Laterality Date  . Tubal ligation    . Abdominal hysterectomy    . Esophagogastroduodenoscopy N/A 02/16/2013    Procedure: ESOPHAGOGASTRODUODENOSCOPY (EGD);  Surgeon: Graylin Shiver, MD;  Location: [facility] ENDOSCOPY;  Service: Endoscopy;  Laterality: N/A;  . Insertion of dialysis catheter Right 03/03/2013    Procedure: INSERTION OF DIALYSIS CATHETER;  Surgeon: Larina Earthly, MD;  Location: [facility] OR;  Service: Vascular;  Laterality: Right;  Right Internal Jugular Placement  . Av fistula placement Left 03/03/2013    Procedure: ARTERIOVENOUS (AV) FISTULA CREATION, Brachial/Cephalic;  Surgeon: Larina Earthly, MD;  Location: [facility] OR;  Service: Vascular;  Laterality: Left;   Family History  Problem Relation Age of Onset  . Diabetes Mellitus II Sister   . Diabetes Mellitus II Brother   . CAD Brother    History  Substance Use Topics  . Smoking status: Former Games developer  . Smokeless tobacco: Current User    Types: Snuff, Chew  . Alcohol Use: No   OB History   Grav Para Term Preterm Abortions TAB SAB Ect Mult Living                 Review of Systems  Unable to perform ROS Cardiovascular: Positive for chest pain.   level V caveat pertains review of systems due to her altered mental  status.  Allergies  Review of patient's allergies indicates no known allergies.  Home Medications   Current Outpatient Rx  Name  Route  Sig  Dispense  Refill  . albuterol (PROVENTIL HFA;VENTOLIN HFA) 108 (90 BASE) MCG/ACT inhaler   Inhalation   Inhale 2 puffs into the lungs every 6 (six) hours as needed for wheezing or shortness of breath.          Marland Kitchen amLODipine (NORVASC) 10 MG tablet   Oral   Take 1 tablet (10 mg total) by mouth daily.   30 tablet   2   . aspirin EC 81 MG tablet   Oral   Take 81 mg by mouth daily.         . carvedilol (COREG CR) 40 MG 24 hr capsule   Oral   Take 1 capsule (40 mg total) by mouth daily.   30 capsule   2   . cloNIDine (CATAPRES) 0.1 MG tablet   Oral   Take 1 tablet (0.1 mg total) by mouth daily.   30 tablet   2   . ferrous sulfate 325 (65 FE) MG tablet   Oral   Take 325 mg by mouth daily with breakfast.         . furosemide (LASIX) 40 MG tablet   Oral   Take 1 tablet (40 mg total) by mouth 2 (two) times daily.   60 tablet   1   . gabapentin (NEURONTIN) 300 MG capsule   Oral   Take 1 capsule (300 mg total) by mouth at bedtime.         . hydrALAZINE (APRESOLINE) 100 MG tablet   Oral   Take 1 tablet (100 mg total) by mouth 3 (three) times daily.   90 tablet   2   . insulin glargine (LANTUS) 100 UNIT/ML injection   Subcutaneous   Inject 0.2 mLs (20 Units total) into the skin at bedtime.   10 mL   12   . insulin lispro (HUMALOG) 100 UNIT/ML injection   Subcutaneous   Inject 0-10 Units into the skin 3 (three) times daily before meals. Pt on sliding scale         . isosorbide mononitrate (IMDUR) 120 MG 24 hr tablet   Oral   Take 120 mg by mouth daily.         . metolazone (ZAROXOLYN) 5 MG tablet   Oral   Take 1 tablet (5 mg total) by mouth 2 (two) times daily.   60 tablet   1   . nitroGLYCERIN (NITROSTAT) 0.4 MG SL tablet   Sublingual   Place 0.4 mg under the tongue every 5 (five) minutes as needed for  chest pain.          Marland Kitchen omeprazole (PRILOSEC) 20  MG capsule   Oral   Take 20 mg by mouth daily.         . polyethylene glycol (MIRALAX / GLYCOLAX) packet   Oral   Take 17 g by mouth daily as needed (for constipation).   14 each   0   . traMADol (ULTRAM) 50 MG tablet   Oral   Take 50 mg by mouth 3 (three) times daily as needed for pain.          BP 142/57  Pulse 50  Temp(Src) 97.2 F (36.2 C) (Oral)  Resp 13  SpO2 100% Physical Exam  Nursing note and vitals reviewed. Constitutional: She appears well-developed and well-nourished. She appears distressed.  HENT:  Head: Normocephalic and atraumatic.  Mouth/Throat: Oropharynx is clear and moist.  Eyes: Conjunctivae are normal. Pupils are equal, round, and reactive to light.  Neck: Normal range of motion.  Cardiovascular: Normal rate, regular rhythm and normal heart sounds.   No murmur heard. Bradycardic.  Pulmonary/Chest: Effort normal and breath sounds normal. No respiratory distress.  Abdominal: Soft. Bowel sounds are normal. There is no tenderness.  Musculoskeletal: Normal range of motion.  Neurological: She exhibits normal muscle tone. Coordination normal.  Awake but somnolent unable to follow commands but will mumble words.  Skin: Skin is warm. No erythema.    ED Course  Procedures (including critical care time) Labs Review Labs Reviewed  COMPREHENSIVE METABOLIC PANEL - Abnormal; Notable for the following:    Sodium 134 (*)    Glucose, Bld 123 (*)    BUN 88 (*)    Creatinine, Ser 3.51 (*)    Calcium 8.0 (*)    Total Protein 5.9 (*)    Albumin 2.1 (*)    AST 56 (*)    Alkaline Phosphatase 138 (*)    Total Bilirubin 0.2 (*)    GFR calc non Af Amer 14 (*)    GFR calc Af Amer 16 (*)    All other components within normal limits  CBC WITH DIFFERENTIAL - Abnormal; Notable for the following:    RBC 2.14 (*)    Hemoglobin 6.6 (*)    HCT 19.5 (*)    RDW 16.1 (*)    Platelets 147 (*)    Neutrophils Relative  % 83 (*)    Lymphocytes Relative 8 (*)    Lymphs Abs 0.6 (*)    All other components within normal limits  PRO B NATRIURETIC PEPTIDE - Abnormal; Notable for the following:    Pro B Natriuretic peptide (BNP) 8133.0 (*)    All other components within normal limits  GLUCOSE, CAPILLARY - Abnormal; Notable for the following:    Glucose-Capillary 113 (*)    All other components within normal limits  GLUCOSE, CAPILLARY - Abnormal; Notable for the following:    Glucose-Capillary 114 (*)    All other components within normal limits  CULTURE, BLOOD (ROUTINE X 2)  CULTURE, BLOOD (ROUTINE X 2)  LIPASE, BLOOD  TROPONIN I  CG4 I-STAT (LACTIC ACID)   Results for orders placed during the hospital encounter of 03/15/13  COMPREHENSIVE METABOLIC PANEL      Result Value Range   Sodium 134 (*) 135 - 145 mEq/L   Potassium 4.4  3.5 - 5.1 mEq/L   Chloride 97  96 - 112 mEq/L   CO2 26  19 - 32 mEq/L   Glucose, Bld 123 (*) 70 - 99 mg/dL   BUN 88 (*) 6 - 23 mg/dL   Creatinine, Ser 1.61 (*)  0.50 - 1.10 mg/dL   Calcium 8.0 (*) 8.4 - 10.5 mg/dL   Total Protein 5.9 (*) 6.0 - 8.3 g/dL   Albumin 2.1 (*) 3.5 - 5.2 g/dL   AST 56 (*) 0 - 37 U/L   ALT 27  0 - 35 U/L   Alkaline Phosphatase 138 (*) 39 - 117 U/L   Total Bilirubin 0.2 (*) 0.3 - 1.2 mg/dL   GFR calc non Af Amer 14 (*) >90 mL/min   GFR calc Af Amer 16 (*) >90 mL/min  LIPASE, BLOOD      Result Value Range   Lipase 28  11 - 59 U/L  CBC WITH DIFFERENTIAL      Result Value Range   WBC 7.4  4.0 - 10.5 K/uL   RBC 2.14 (*) 3.87 - 5.11 MIL/uL   Hemoglobin 6.6 (*) 12.0 - 15.0 g/dL   HCT 14.7 (*) 82.9 - 56.2 %   MCV 91.1  78.0 - 100.0 fL   MCH 30.8  26.0 - 34.0 pg   MCHC 33.8  30.0 - 36.0 g/dL   RDW 13.0 (*) 86.5 - 78.4 %   Platelets 147 (*) 150 - 400 K/uL   Neutrophils Relative % 83 (*) 43 - 77 %   Neutro Abs 6.1  1.7 - 7.7 K/uL   Lymphocytes Relative 8 (*) 12 - 46 %   Lymphs Abs 0.6 (*) 0.7 - 4.0 K/uL   Monocytes Relative 8  3 - 12 %    Monocytes Absolute 0.6  0.1 - 1.0 K/uL   Eosinophils Relative 1  0 - 5 %   Eosinophils Absolute 0.1  0.0 - 0.7 K/uL   Basophils Relative 0  0 - 1 %   Basophils Absolute 0.0  0.0 - 0.1 K/uL  TROPONIN I      Result Value Range   Troponin I <0.30  <0.30 ng/mL  PRO B NATRIURETIC PEPTIDE      Result Value Range   Pro B Natriuretic peptide (BNP) 8133.0 (*) 0 - 125 pg/mL  GLUCOSE, CAPILLARY      Result Value Range   Glucose-Capillary 113 (*) 70 - 99 mg/dL  GLUCOSE, CAPILLARY      Result Value Range   Glucose-Capillary 114 (*) 70 - 99 mg/dL  CG4 I-STAT (LACTIC ACID)      Result Value Range   Lactic Acid, Venous 0.82  0.5 - 2.2 mmol/L    Imaging Review Dg Chest Port 1 View  03/15/2013   CLINICAL DATA:  Chest pain  EXAM: PORTABLE CHEST - 1 VIEW  COMPARISON:  03/03/2013  FINDINGS: Cardiac enlargement stable. Vascular calcification of the aortic arch stable. Dual lumen right-sided central line unchanged.  There is vascular congestion. There is mild hazy density overlying the left lung, possibly due to patient rotation. No focal consolidation or definite evidence of pulmonary edema.  IMPRESSION: Vascular congestion.  No focal consolidation.   Electronically Signed   By: Esperanza Heir M.D.   On: 03/15/2013 14:25    EKG Interpretation     Ventricular Rate:  48 PR Interval:  164 QRS Duration: 94 QT Interval:  536 QTC Calculation: 479 R Axis:   63 Text Interpretation:  Sinus bradycardia Anterior infarct, old No significant change since last tracing            MDM   1. Hypoglycemia   2. Hypothermia, initial encounter   3. Chest pain    Patient difficult historian level V caveat applies to history  and review of systems. Patient brought in by EMS complaint the initially was hypoglycemia and chest pain. Patient was noted to be hypothermic upon arrival here with a temperature of 92.6. Started on a pair of her. Patient's room air pulse ox 100%. Blood pressure was normal not  hypotensive. Patient's mental status seemed altered.  Patient had blood cultures done as well as potential septic workup. Although not meeting CT criteria for moment. Patient's chest x-rays negative for pneumonia. Patient's blood sugar here is remain stable around 114. Labs still pending. Chest x-ray as stated as above. When I saw the patient she was not complaining of chest pain. Troponin is pending EKG as stated above but no acute cardiac changes. Patient will most likely require admission. Patient's primary care doctors Dr. Andrey Campanile.  Lactate is normal not consistent with a septic picture. Other labs including troponin are still pending.  Shelda Jakes, MD 03/15/13 1650  Patient's mental status is now returned to normal. Patient answered questions appropriately. Still have the problem with the hypothermia and the chest pain. In the hypoglycemia. Will require admission. Patient is followed by Benedetto Goad from [facility] family practice. Patient secondly unassigned will contact admitting service. Patient's troponin was negative no evidence of acute cardiac event so far. EKG no acute cardiac changes. No evidence of infection or sepsis.  Shelda Jakes, MD 03/15/13 1721  Shelda Jakes, MD 03/15/13 1728

## 2013-03-15 NOTE — ED Notes (Signed)
Family at bedside. 

## 2013-03-15 NOTE — ED Notes (Addendum)
Pt reports to the ED for eval of hypoglycemia and CP. Pt arrives via [facility]. Upon EMS arrival pt was sitting on the bedside commode and found pt to be lethargic and altered mental status. CBG upon EMS arrival was 60 mg/dl. Per family members pts blood sugar 100-160s mg/dl. Pt was cool and slightly diaphoretic upon EMS. Pt was given 25 g of dextrose. CBG 113 mg/dl at this time. Pt still lethargic and . Pt grabbing at her chest and crying out. Pts CP was a 10/10 upon EMS arrival and was given 1 nitro en route and her CP went from a 10/10 to a 4/10. Pt was recently placed on dialysis. Unknown last dialysis session. Pt was d/c on Saturday. Pts 12 lead for EMS showed sinus bradycardia. Pt lethargic and refusing to answer questions. Pt moaning and complaining of being cold.

## 2013-03-15 NOTE — H&P (Signed)
Date: 03/15/2013               Patient Name:  Madeline [name] MRN: [id number]  DOB: 01-04-1956 Age / Sex: 57 y.o., female   PCP: Barbie Banner, MD         Medical Service: Internal Medicine Teaching Service         Attending Physician: Dr. Burns Spain, MD    First Contact: Dr. Johna Roles Pager: [phone]  Second Contact: Dr. Burtis Junes  Pager: [phone]       After Hours (After 5p/  First Contact Pager: [phone]  weekends / holidays): Second Contact Pager: [phone]   Chief Complaint: lethargy, epigastric pain, CP    History of Present Illness:   Madeline [name] is a 57 year old woman with pmh of insulin dependent T2DM, CKD Stage IV, CHF w/pEF, and OSA on CPAP  who presents with lethargy, CP, and epigastric pain that began less than 24 hours ago. Per daughter patient was feeling her normal self until the daughter left to go to work at [employer]. Patient then had lunch and was found by her granddaughter with lethargy, weakness, and slurred speech while sitting on the bedside commode. Upon EMS arrival patient's glucose was 60 and she was cool and slightly diaphoretic. She was given IV dextrose with improvement of glucose to 113 however no improvement of mental status. She reports epigastric pain, chest pain, dyspnea, lightheadedness, constipation, and vomiting. Per daughter no hematemesis, hematochezia, or melena. She is on iron pills with black stools.      Patient was recently hospitalized form 10/17-10/26 for acute renal failure requiring HD. She had acute blood loss from catheter site with blood transfusion of 3PRBs with improvement of Hg to 8.2 on discharge. EGD was done at that time that revealed multiple focal gastric antral ulcers. Per daughter colonoscopy in April showed polys that were removed. She is on ASA daily with no reports of NSAID use.    In the ED patient was hemodynamically stable with no active bleeding and found to be hypothermic (33.7 C).         Meds: Current Facility-Administered  Medications  Medication Dose Route Frequency Provider Last Rate Last Dose  . 0.9 %  sodium chloride infusion   Intravenous Continuous Shelda Jakes, MD      . pantoprazole (PROTONIX) injection 40 mg  40 mg Intravenous Q12H Christen Bame, MD       Current Outpatient Prescriptions  Medication Sig Dispense Refill  . albuterol (PROVENTIL HFA;VENTOLIN HFA) 108 (90 BASE) MCG/ACT inhaler Inhale 2 puffs into the lungs every 6 (six) hours as needed for wheezing or shortness of breath.       Marland Kitchen amLODipine (NORVASC) 10 MG tablet Take 1 tablet (10 mg total) by mouth daily.  30 tablet  2  . aspirin EC 81 MG tablet Take 81 mg by mouth daily.      . carvedilol (COREG CR) 40 MG 24 hr capsule Take 1 capsule (40 mg total) by mouth daily.  30 capsule  2  . cloNIDine (CATAPRES) 0.1 MG tablet Take 1 tablet (0.1 mg total) by mouth daily.  30 tablet  2  . ferrous sulfate 325 (65 FE) MG tablet Take 325 mg by mouth daily with breakfast.      . furosemide (LASIX) 40 MG tablet Take 1 tablet (40 mg total) by mouth 2 (two) times daily.  60 tablet  1  . gabapentin (NEURONTIN) 300 MG capsule Take 1 capsule (300 mg total)  by mouth at bedtime.      . hydrALAZINE (APRESOLINE) 100 MG tablet Take 1 tablet (100 mg total) by mouth 3 (three) times daily.  90 tablet  2  . insulin glargine (LANTUS) 100 UNIT/ML injection Inject 0.2 mLs (20 Units total) into the skin at bedtime.  10 mL  12  . insulin lispro (HUMALOG) 100 UNIT/ML injection Inject 0-10 Units into the skin 3 (three) times daily before meals. Pt on sliding scale      . isosorbide mononitrate (IMDUR) 120 MG 24 hr tablet Take 120 mg by mouth daily.      . metolazone (ZAROXOLYN) 5 MG tablet Take 1 tablet (5 mg total) by mouth 2 (two) times daily.  60 tablet  1  . nitroGLYCERIN (NITROSTAT) 0.4 MG SL tablet Place 0.4 mg under the tongue every 5 (five) minutes as needed for chest pain.       Marland Kitchen omeprazole (PRILOSEC) 20 MG capsule Take 20 mg by mouth daily.      . polyethylene  glycol (MIRALAX / GLYCOLAX) packet Take 17 g by mouth daily as needed (for constipation).  14 each  0  . traMADol (ULTRAM) 50 MG tablet Take 50 mg by mouth 3 (three) times daily as needed for pain.        Allergies: Allergies as of 03/15/2013  . (No Known Allergies)   Past Medical History  Diagnosis Date  . CHF (congestive heart failure)     a. HFpEF with RHF/anasarca/pHTN.  . COPD (chronic obstructive pulmonary disease)   . Coronary artery disease     a. Per [place] records: NSTEMI 01/2012, tx medically given ARF but suspected CAD. b. Stress test 12/16/11 reported w/o ischemia.  . Hypertension   . Diabetes mellitus   . OSA (obstructive sleep apnea)     a. Pt reported used to use CPAP in [place], but "ran out" when came to [facility].  Marland Kitchen CKD (chronic kidney disease), stage III     a. Per [place] records: h/o ARF after CTA that ruled out PE.  Marland Kitchen Pulmonary hypertension     a. [facility] 02/28/13: mod pulm HTN with normal PVR suggestive of predominantly pulmonary venous HTN.  . Right heart failure   . Anasarca     a. Per [place] records - due to pulm HTN with R HF.   Marland Kitchen Aseptic meningitis     a. 09/2012: adm in [place] for metabolic encephalopathy, oliguric tubular necrosis, anemia, HTN, possible CVA, HHNKA.  Marland Kitchen CVA (cerebral infarction)     a. Per [place] records, "possible CVA" 09/2012 but MRI reportedly negative.  . Abnormal Doppler ultrasound of carotid artery     a. Per [place] records: <50% LICA.   Past Surgical History  Procedure Laterality Date  . Tubal ligation    . Abdominal hysterectomy    . Esophagogastroduodenoscopy N/A 02/16/2013    Procedure: ESOPHAGOGASTRODUODENOSCOPY (EGD);  Surgeon: Graylin Shiver, MD;  Location: [facility] ENDOSCOPY;  Service: Endoscopy;  Laterality: N/A;  . Insertion of dialysis catheter Right 03/03/2013    Procedure: INSERTION OF DIALYSIS CATHETER;  Surgeon: Larina Earthly, MD;  Location: [facility] OR;  Service: Vascular;  Laterality: Right;  Right Internal Jugular Placement  . Av fistula placement Left 03/03/2013     Procedure: ARTERIOVENOUS (AV) FISTULA CREATION, Brachial/Cephalic;  Surgeon: Larina Earthly, MD;  Location: [facility] OR;  Service: Vascular;  Laterality: Left;   Family History  Problem Relation Age of Onset  . Diabetes Mellitus II Sister   .  Diabetes Mellitus II Brother   . CAD Brother    History   Social History  . Marital Status: Widowed    Spouse Name: N/A    Number of Children: N/A  . Years of Education: N/A   Occupational History  . Not on file.   Social History Main Topics  . Smoking status: Former Games developer  . Smokeless tobacco: Current User    Types: Snuff, Chew  . Alcohol Use: No  . Drug Use: No  . Sexual Activity: No   Other Topics Concern  . Not on file   Social History Narrative  . No narrative on file    Review of Systems: Review of Systems  Constitutional: Positive for chills, malaise/fatigue and diaphoresis.  Respiratory: Positive for shortness of breath.   Cardiovascular: Positive for chest pain.  Gastrointestinal: Positive for vomiting, abdominal pain and constipation. Negative for blood in stool.       Dark stools on iron  Neurological: Positive for dizziness and weakness.   Physical Exam: Blood pressure 155/66, pulse 57, temperature 98.7 F (37.1 C), temperature source Oral, resp. rate 13, SpO2 100.00%. Physical Exam  Constitutional: She is oriented to person, place, and time. She appears distressed.  Patient lying in bed, lethargic, cold and covered in blankets Unable to speak more than a few words.  HENT:  Head: Normocephalic and atraumatic.  Eyes: EOM are normal.  Neck: Normal range of motion. Neck supple.  Cardiovascular: Regular rhythm and normal heart sounds.   No murmur heard. bradycardic  Pulmonary/Chest: Effort normal and breath sounds normal. No respiratory distress. She has no wheezes. She has no rales.  Unable to examine posterior lung fields  Abdominal: Soft. Bowel sounds are normal. There is tenderness (epigastric region). There is no  rebound and no guarding.  Musculoskeletal: Normal range of motion. She exhibits no edema and no tenderness.  Neurological: She is alert and oriented to person, place, and time.  Skin: Skin is warm and dry. She is not diaphoretic.  Psychiatric: She has a normal mood and affect. Her behavior is normal.    Lab results: Basic Metabolic Panel:  Recent Labs  16/10/96 1530  NA 134*  K 4.4  CL 97  CO2 26  GLUCOSE 123*  BUN 88*  CREATININE 3.51*  CALCIUM 8.0*   Liver Function Tests:  Recent Labs  03/15/13 1530  AST 56*  ALT 27  ALKPHOS 138*  BILITOT 0.2*  PROT 5.9*  ALBUMIN 2.1*    Recent Labs  03/15/13 1530  LIPASE 28   No results found for this basename: AMMONIA,  in the last 72 hours CBC:  Recent Labs  03/15/13 1530  WBC 7.4  NEUTROABS 6.1  HGB 6.6*  HCT 19.5*  MCV 91.1  PLT 147*   Cardiac Enzymes:  Recent Labs  03/15/13 1530  TROPONINI <0.30   BNP:  Recent Labs  03/15/13 1530  PROBNP 8133.0*   D-Dimer: No results found for this basename: DDIMER,  in the last 72 hours CBG:  Recent Labs  03/15/13 1313 03/15/13 1520  GLUCAP 113* 114*   Hemoglobin A1C: No results found for this basename: HGBA1C,  in the last 72 hours Fasting Lipid Panel: No results found for this basename: CHOL, HDL, LDLCALC, TRIG, CHOLHDL, LDLDIRECT,  in the last 72 hours Thyroid Function Tests: No results found for this basename: TSH, T4TOTAL, FREET4, T3FREE, THYROIDAB,  in the last 72 hours Anemia Panel: No results found for this basename: VITAMINB12, FOLATE, FERRITIN, TIBC, IRON,  RETICCTPCT,  in the last 72 hours Coagulation: No results found for this basename: LABPROT, INR,  in the last 72 hours Urine Drug Screen: Drugs of Abuse  No results found for this basename: labopia, cocainscrnur, labbenz, amphetmu, thcu, labbarb    Alcohol Level: No results found for this basename: ETH,  in the last 72 hours Urinalysis:  Recent Labs  03/15/13 1745  COLORURINE YELLOW   LABSPEC 1.011  PHURINE 5.5  GLUCOSEU NEGATIVE  HGBUR NEGATIVE  BILIRUBINUR NEGATIVE  KETONESUR NEGATIVE  PROTEINUR >300*  UROBILINOGEN 0.2  NITRITE NEGATIVE  LEUKOCYTESUR NEGATIVE   Misc. Labs:   Imaging results:  Ct Head Wo Contrast  03/15/2013   CLINICAL DATA:  Altered mental status, hypoglycemic the  EXAM: CT HEAD WITHOUT CONTRAST  TECHNIQUE: Contiguous axial images were obtained from the base of the skull through the vertex without intravenous contrast.  COMPARISON:  None.  FINDINGS: No acute intracranial hemorrhage or large vessel territory infarct is identified. Gray-white matter differentiation is maintained. No mass or midline shift. CSF containing spaces are within normal limits without evidence of hydrocephalus. No extra-axial fluid collection.  Calvarium is intact.  Orbital soft tissues are within normal limits.  Paranasal sinuses and mastoid air cells are clear.  IMPRESSION: Normal head CT with no acute intracranial process.   Electronically Signed   By: Rise Mu M.D.   On: 03/15/2013 19:24   Dg Chest Port 1 View  03/15/2013   CLINICAL DATA:  Chest pain  EXAM: PORTABLE CHEST - 1 VIEW  COMPARISON:  03/03/2013  FINDINGS: Cardiac enlargement stable. Vascular calcification of the aortic arch stable. Dual lumen right-sided central line unchanged.  There is vascular congestion. There is mild hazy density overlying the left lung, possibly due to patient rotation. No focal consolidation or definite evidence of pulmonary edema.  IMPRESSION: Vascular congestion.  No focal consolidation.   Electronically Signed   By: Esperanza Heir M.D.   On: 03/15/2013 14:25    Other results: EKG Ventricular Rate: 48  PR Interval: 164  QRS Duration: 94  QT Interval: 536  QTC Calculation: 479  R Axis: 63  Text Interpretation: Sinus bradycardia Anterior infarct, old No significant change since last tracing  Assessment & Plan by Problem: Principal Problem:   GI bleed Active  Problems:   Anemia, chronic disease   DM (diabetes mellitus)   Chronic kidney disease (CKD), stage IV (severe)   OSA on CPAP  Assessment:  57 year old woman with pmh of insulin dependent T2DM, CKD Stage IV, CHF w/pEF, and OSA on CPAP  who presents with lethargy, CP, and epigastric pain that began less than 24 hours ago and found to be hypothermic with Hg 6.6,  positive FOBT without active bleeding.    Plan  Symptomatic anemia -  Most likely due to upper GI bleed. BUN/Cr 25.  Patient with Hg on admission of 6.6 with  baseline Hg 8. She is hemodynamically stable with no signs of active bleeding, however FOBT positive indicating GI bleed. No reports of melena, hematochezia, or hematemesis. She recently started on iron and is ASA. No NSAID use.  She was recently admitted and d/c on 10/26 for ARF and acute anemia as low as 6.4 and returned to baseline after 3 units PRBCs. EGD showed scattered gastric ulceration and no colonoscopy was done at that time. Last colonoscopy not in epic per daughter in April with polyps that were removed.  Pt received 250 cc bolus in ED.  -Administer 2Units PRBCs for goal  Hg>8  -Appreciate GI consult - repeat EGD not warranted, colonoscopy if not previously done, may consider capsule endoscopy  -Obtain medical records of past colonoscopy -Admit to Stepdown   Epigastric Pain - Most likely due to gastric antral ulcer bleeding considering symptomatic anemia and positive FOBT. Lipase within normal, so acute pancreatitis less likely. Patient with history of GERD on omeprazole at home.   -NPO-- advance diet as tolerated -Consider CT abdomen if epigastric pain continues  -Zofran PRN nausea -IV protonix 40 mg daily  AMS -  Pt was eating and then sudden weakness no other deficits. No focal neuro deficits.Per daughter patient with stroke in May with residual left sided weakness. SIRS with hypothermia and tachypnea but no frank leukocytosis although neutrophilia. Lactic acid within  normal limits. CXR without sign of cardiopulmonary disease. Initally hypoglycemic to 60s that responded to D50. Initial trop negative and no EKG changes.  -CT head --> no acute intracranial process -Obtain blood cultures x 2  -Obtain UA --> proteinuria -Neuro checks  -Trend troponin    AKI on CKD stage IV -  Most likely due to prerenal azotemia (decreased renal perfusion) vs ATN (ischmemia due to hypotension) in setting of possible GI blood loss and diuretic use. Pt had recent AV fistula placed on 10/26 in anticipation of HD and received 3 HD sessions during last admission. Baseline Cr 2.5 and 3.5 and elevated BUN. N no emergent need for HD.   -IVF TKO  -Consult renal in AM if decompensates or worsening renal function for HD   Chronic diastolic HF -  She has history of RHF/anasarca with pulmonary HTN 01/2012. She does not appear volume overloaded, although pt had recent increase in lasix to 160mg  for the past 2 days per a reported 3-10lb weight gain per daughter. Last echo 10/14 EF 65% with LVH and bilaterally dilated atrium and diastolic filling impairment. CXR with some vascular congestion and slightly elevated pro-bnp 8000 from 7000s.  -Consider lasix if becomes dyspneic in setting of transfusion  -Holding anti-hypertensives in setting of GI bleed   Insulin-dependent Type II DM - Pt with last HgbA1c 7.7 on 10/14. Glucose on admission was 113 however per EMS on arrival glucose in 60s with no anion gap. Pt on 20 U daily Lantus and sliding scale at home.    -Insulin sliding scale  -Glucose monitoring x 4 daily    Thrombocytopenia - Patient with platelet of 147K on admission. On discharge 1 week ago 180K. -Monitor for bleeding -Monitor CBC   Obstructive Sleep Apnea  - Patient with history of OSA on home CPAP -Cont CPAP    Dispo: Disposition is deferred at this time, awaiting improvement of current medical problems. Anticipated discharge in approximately 3-4 day(s).   The patient does have  a current PCP Barbie Banner, MD) and does need an [facility] hospital follow-up appointment after discharge.  The patient does have transportation limitations that hinder transportation to clinic appointments.  Signed: Otis Brace, MD 03/15/2013, 7:42 PM

## 2013-03-15 NOTE — Consult Note (Signed)
Referring Provider: Dr. Rogelia Boga Primary Care Physician:  Pamelia Hoit, MD Primary Gastroenterologist:  Gentry Fitz  Reason for Consultation:  Anemia; Heme positive stool  HPI: Madeline [name] is a 57 y.o. female who was initially seen by our group last month as an unassigned pt for anemia and had an EGD by Dr. Evette Cristal that showed small gastric ulcerations without active bleeding. She was brought in today with weakness, chest pain and epigastric pain that started today per admit H/P. She reports having intermittent chest pain since her MI one year ago. She reports having intermittent generalized abdominal pain that starts in her back and radiates to her abdomen for weeks. Pain will last for hours when it comes on. Denies hematochezia. Black stools on iron pills. Glucose low at 60 and pt was diaphoretic and had slurred speech while on the toilet and was brought in. Denies hematemesis. She reports having a normal colonoscopy in April or May of this year in [facility] but records not available at this time. Admit note states polyps were removed on that colonoscopy. Hgb 8.2 on d/c at the end of October and now Hgb 6.6. Heme positive. Denies NSAIDs other than Aspirin 81 mg that patient is on. History of heartburn.     Past Medical History  Diagnosis Date  . CHF (congestive heart failure)     a. HFpEF with RHF/anasarca/pHTN.  . COPD (chronic obstructive pulmonary disease)   . Coronary artery disease     a. Per [place] records: NSTEMI 01/2012, tx medically given ARF but suspected CAD. b. Stress test 12/16/11 reported w/o ischemia.  . Hypertension   . Diabetes mellitus   . OSA (obstructive sleep apnea)     a. Pt reported used to use CPAP in [place], but "ran out" when came to [facility].  Marland Kitchen CKD (chronic kidney disease), stage III     a. Per [place] records: h/o ARF after CTA that ruled out PE.  Marland Kitchen Pulmonary hypertension     a. [facility] 02/28/13: mod pulm HTN with normal PVR suggestive of predominantly pulmonary venous HTN.  . Right heart  failure   . Anasarca     a. Per [place] records - due to pulm HTN with R HF.   Marland Kitchen Aseptic meningitis     a. 09/2012: adm in [place] for metabolic encephalopathy, oliguric tubular necrosis, anemia, HTN, possible CVA, HHNKA.  Marland Kitchen CVA (cerebral infarction)     a. Per [place] records, "possible CVA" 09/2012 but MRI reportedly negative.  . Abnormal Doppler ultrasound of carotid artery     a. Per [place] records: <50% LICA.    Past Surgical History  Procedure Laterality Date  . Tubal ligation    . Abdominal hysterectomy    . Esophagogastroduodenoscopy N/A 02/16/2013    Procedure: ESOPHAGOGASTRODUODENOSCOPY (EGD);  Surgeon: Graylin Shiver, MD;  Location: [facility] ENDOSCOPY;  Service: Endoscopy;  Laterality: N/A;  . Insertion of dialysis catheter Right 03/03/2013    Procedure: INSERTION OF DIALYSIS CATHETER;  Surgeon: Larina Earthly, MD;  Location: [facility] OR;  Service: Vascular;  Laterality: Right;  Right Internal Jugular Placement  . Av fistula placement Left 03/03/2013    Procedure: ARTERIOVENOUS (AV) FISTULA CREATION, Brachial/Cephalic;  Surgeon: Larina Earthly, MD;  Location: [facility] OR;  Service: Vascular;  Laterality: Left;    Prior to Admission medications   Medication Sig Start Date End Date Taking? Authorizing Provider  albuterol (PROVENTIL HFA;VENTOLIN HFA) 108 (90 BASE) MCG/ACT inhaler Inhale 2 puffs into the lungs every 6 (six) hours as needed  for wheezing or shortness of breath.     Historical Provider, MD  amLODipine (NORVASC) 10 MG tablet Take 1 tablet (10 mg total) by mouth daily. 03/05/13   Osvaldo Shipper, MD  aspirin EC 81 MG tablet Take 81 mg by mouth daily.    Historical Provider, MD  carvedilol (COREG CR) 40 MG 24 hr capsule Take 1 capsule (40 mg total) by mouth daily. 03/05/13   Osvaldo Shipper, MD  cloNIDine (CATAPRES) 0.1 MG tablet Take 1 tablet (0.1 mg total) by mouth daily. 03/05/13   Osvaldo Shipper, MD  ferrous sulfate 325 (65 FE) MG tablet Take 325 mg by mouth daily with breakfast.    Historical Provider, MD   furosemide (LASIX) 40 MG tablet Take 1 tablet (40 mg total) by mouth 2 (two) times daily. 03/05/13   Osvaldo Shipper, MD  gabapentin (NEURONTIN) 300 MG capsule Take 1 capsule (300 mg total) by mouth at bedtime. 03/05/13   Osvaldo Shipper, MD  hydrALAZINE (APRESOLINE) 100 MG tablet Take 1 tablet (100 mg total) by mouth 3 (three) times daily. 03/05/13   Osvaldo Shipper, MD  insulin glargine (LANTUS) 100 UNIT/ML injection Inject 0.2 mLs (20 Units total) into the skin at bedtime. 03/05/13   Osvaldo Shipper, MD  insulin lispro (HUMALOG) 100 UNIT/ML injection Inject 0-10 Units into the skin 3 (three) times daily before meals. Pt on sliding scale    Historical Provider, MD  isosorbide mononitrate (IMDUR) 120 MG 24 hr tablet Take 120 mg by mouth daily.    Historical Provider, MD  metolazone (ZAROXOLYN) 5 MG tablet Take 1 tablet (5 mg total) by mouth 2 (two) times daily. 03/05/13   Osvaldo Shipper, MD  nitroGLYCERIN (NITROSTAT) 0.4 MG SL tablet Place 0.4 mg under the tongue every 5 (five) minutes as needed for chest pain.     Historical Provider, MD  omeprazole (PRILOSEC) 20 MG capsule Take 20 mg by mouth daily.    Historical Provider, MD  polyethylene glycol (MIRALAX / GLYCOLAX) packet Take 17 g by mouth daily as needed (for constipation). 03/05/13   Osvaldo Shipper, MD  traMADol (ULTRAM) 50 MG tablet Take 50 mg by mouth 3 (three) times daily as needed for pain.    Historical Provider, MD    Scheduled Meds: . [START ON 03/16/2013] insulin aspart  0-9 Units Subcutaneous TID WC  . pantoprazole (PROTONIX) IV  40 mg Intravenous Q12H   Continuous Infusions:  PRN Meds:.ondansetron (ZOFRAN) IV, ondansetron  Allergies as of 03/15/2013  . (No Known Allergies)    Family History  Problem Relation Age of Onset  . Diabetes Mellitus II Sister   . Diabetes Mellitus II Brother   . CAD Brother     History   Social History  . Marital Status: Widowed    Spouse Name: N/A    Number of Children: N/A  . Years of  Education: N/A   Occupational History  . Not on file.   Social History Main Topics  . Smoking status: Former Games developer  . Smokeless tobacco: Current User    Types: Snuff, Chew  . Alcohol Use: No  . Drug Use: No  . Sexual Activity: No   Other Topics Concern  . Not on file   Social History Narrative  . No narrative on file    Review of Systems: All negative from GI standpoint except as stated above in HPI.  Physical Exam: Vital signs: Filed Vitals:   03/15/13 2100  BP: 160/61  Pulse: 59  Temp: 98.6  Resp: 12     General:   Lethargic, disheveled, Well-developed, well-nourished, no acute distress HEENT: anicteric Neck: supple, no LAD Lungs:  Clear throughout to auscultation.   No wheezes, crackles, or rhonchi. No acute distress. Heart:  Regular rate and rhythm; no murmurs, clicks, rubs,  or gallops. Abdomen: diffusely tender with guarding, soft, nondistended, +BS  Rectal:  Deferred Ext: no edema Skin: no rash  GI:  Lab Results:  Recent Labs  03/15/13 1530  WBC 7.4  HGB 6.6*  HCT 19.5*  PLT 147*   BMET  Recent Labs  03/15/13 1530  NA 134*  K 4.4  CL 97  CO2 26  GLUCOSE 123*  BUN 88*  CREATININE 3.51*  CALCIUM 8.0*   LFT  Recent Labs  03/15/13 1530  PROT 5.9*  ALBUMIN 2.1*  AST 56*  ALT 27  ALKPHOS 138*  BILITOT 0.2*   PT/INR No results found for this basename: LABPROT, INR,  in the last 72 hours   Studies/Results: Ct Head Wo Contrast  03/15/2013   CLINICAL DATA:  Altered mental status, hypoglycemic the  EXAM: CT HEAD WITHOUT CONTRAST  TECHNIQUE: Contiguous axial images were obtained from the base of the skull through the vertex without intravenous contrast.  COMPARISON:  None.  FINDINGS: No acute intracranial hemorrhage or large vessel territory infarct is identified. Gray-white matter differentiation is maintained. No mass or midline shift. CSF containing spaces are within normal limits without evidence of hydrocephalus. No extra-axial  fluid collection.  Calvarium is intact.  Orbital soft tissues are within normal limits.  Paranasal sinuses and mastoid air cells are clear.  IMPRESSION: Normal head CT with no acute intracranial process.   Electronically Signed   By: Rise Mu M.D.   On: 03/15/2013 19:24   Dg Chest Port 1 View  03/15/2013   CLINICAL DATA:  Chest pain  EXAM: PORTABLE CHEST - 1 VIEW  COMPARISON:  03/03/2013  FINDINGS: Cardiac enlargement stable. Vascular calcification of the aortic arch stable. Dual lumen right-sided central line unchanged.  There is vascular congestion. There is mild hazy density overlying the left lung, possibly due to patient rotation. No focal consolidation or definite evidence of pulmonary edema.  IMPRESSION: Vascular congestion.  No focal consolidation.   Electronically Signed   By: Esperanza Heir M.D.   On: 03/15/2013 14:25    Impression/Plan: 56 yo with symptomatic anemia and heme positive stool concerning for a GI blood loss source. Her previous colonoscopy records need to be obtained by the primary team because if she did not have a complete colonoscopy, then she will need one. I do not think a repeat EGD is warranted with one just being done a month ago. May need a capsule endoscopy at some point. Noncontrast abd CT in October was suboptimal but if abdominal pain continues, then may need to repeat that and see if a better study can be obtained. No evidence of an active GI bleed. Will start clears and have Dr. Liliane Channel follow tomorrow.    LOS: 0 days   Manuel Dall C.  03/15/2013, 9:51 PM

## 2013-03-16 DIAGNOSIS — N179 Acute kidney failure, unspecified: Secondary | ICD-10-CM

## 2013-03-16 DIAGNOSIS — N184 Chronic kidney disease, stage 4 (severe): Secondary | ICD-10-CM

## 2013-03-16 DIAGNOSIS — I5032 Chronic diastolic (congestive) heart failure: Secondary | ICD-10-CM

## 2013-03-16 DIAGNOSIS — D649 Anemia, unspecified: Secondary | ICD-10-CM

## 2013-03-16 DIAGNOSIS — G4733 Obstructive sleep apnea (adult) (pediatric): Secondary | ICD-10-CM

## 2013-03-16 DIAGNOSIS — R4182 Altered mental status, unspecified: Secondary | ICD-10-CM

## 2013-03-16 DIAGNOSIS — E119 Type 2 diabetes mellitus without complications: Secondary | ICD-10-CM

## 2013-03-16 LAB — CBC
HCT: 27.3 % — ABNORMAL LOW (ref 36.0–46.0)
HCT: 27.7 % — ABNORMAL LOW (ref 36.0–46.0)
Hemoglobin: 9.7 g/dL — ABNORMAL LOW (ref 12.0–15.0)
MCH: 31.3 pg (ref 26.0–34.0)
MCH: 31.2 pg (ref 26.0–34.0)
MCHC: 35 g/dL (ref 30.0–36.0)
MCV: 89.8 fL (ref 78.0–100.0)
Platelets: 162 10*3/uL (ref 150–400)
RBC: 3.11 MIL/uL — ABNORMAL LOW (ref 3.87–5.11)
RDW: 16 % — ABNORMAL HIGH (ref 11.5–15.5)
RDW: 16.4 % — ABNORMAL HIGH (ref 11.5–15.5)
WBC: 7.8 10*3/uL (ref 4.0–10.5)
WBC: 7.3 10*3/uL (ref 4.0–10.5)

## 2013-03-16 LAB — GLUCOSE, CAPILLARY
Glucose-Capillary: 135 mg/dL — ABNORMAL HIGH (ref 70–99)
Glucose-Capillary: 215 mg/dL — ABNORMAL HIGH (ref 70–99)
Glucose-Capillary: 161 mg/dL — ABNORMAL HIGH (ref 70–99)

## 2013-03-16 LAB — BASIC METABOLIC PANEL
BUN: 86 mg/dL — ABNORMAL HIGH (ref 6–23)
CO2: 24 mEq/L (ref 19–32)
Calcium: 8.6 mg/dL (ref 8.4–10.5)
Chloride: 97 mEq/L (ref 96–112)
Creatinine, Ser: 3.5 mg/dL — ABNORMAL HIGH (ref 0.50–1.10)
Glucose, Bld: 124 mg/dL — ABNORMAL HIGH (ref 70–99)
Potassium: 4.8 mEq/L (ref 3.5–5.1)

## 2013-03-16 LAB — MRSA PCR SCREENING: MRSA by PCR: NEGATIVE

## 2013-03-16 MED ORDER — CLONIDINE HCL 0.1 MG PO TABS
0.1000 mg | ORAL_TABLET | Freq: Every day | ORAL | Status: DC
  Administered 2013-03-16: 0.1 mg via ORAL
  Filled 2013-03-16 (×2): qty 1

## 2013-03-16 MED ORDER — PANTOPRAZOLE SODIUM 40 MG PO TBEC
40.0000 mg | DELAYED_RELEASE_TABLET | Freq: Two times a day (BID) | ORAL | Status: DC
  Administered 2013-03-16 – 2013-03-18 (×4): 40 mg via ORAL
  Filled 2013-03-16 (×4): qty 1

## 2013-03-16 NOTE — Progress Notes (Signed)
Subjective: Pt seen and examined in AM. No acute events overnight. Much improved today. She is no longer weak or confused. She is alert and orientated and states she feels so much better. Denies abdominal pain, nausea, vomiting, or bloody stools.    Objective: Vital signs in last 24 hours: Filed Vitals:   03/16/13 0620 03/16/13 0735 03/16/13 1205 03/16/13 1415  BP: 188/77 161/77 191/76 182/74  Pulse: 58 65 61 66  Temp: 98.6 F (37 C) 98.4 F (36.9 C) 99.1 F (37.3 C) 99.1 F (37.3 C)  TempSrc: Oral Oral Oral Oral  Resp: 12 16 15 18   Height:      Weight:      SpO2: 98% 98% 91% 99%   Weight change:   Intake/Output Summary (Last 24 hours) at 03/16/13 1646 Last data filed at 03/16/13 0736  Gross per 24 hour  Intake   1215 ml  Output   1650 ml  Net   -435 ml   Constitutional: She is oriented to person, place, and time. She is not distressed  Head: Normocephalic and atraumatic.  Eyes: EOM are normal.  Neck: Normal range of motion. Neck supple.  Cardiovascular: Regular rhythm and normal heart sounds.No murmur heard. Pulmonary/Chest: Effort normal and breath sounds normal. No respiratory distress. She has no wheezes. She has no rales.   Abdominal: Soft. Bowel sounds are normal. There is no tenderness. There is no rebound and no guarding.  Musculoskeletal: Normal range of motion. She exhibits no edema and no tenderness.  Neurological: She is alert and oriented to person, place, and time.  Skin: Skin is warm and dry. She is not diaphoretic.  Psychiatric: She has a normal mood and affect. Her behavior is normal.   Lab Results: Basic Metabolic Panel:  Recent Labs Lab 03/15/13 1530 03/16/13 0815  NA 134* 138  K 4.4 4.8  CL 97 97  CO2 26 24  GLUCOSE 123* 124*  BUN 88* 86*  CREATININE 3.51* 3.50*  CALCIUM 8.0* 8.6   Liver Function Tests:  Recent Labs Lab 03/15/13 1530  AST 56*  ALT 27  ALKPHOS 138*  BILITOT 0.2*  PROT 5.9*  ALBUMIN 2.1*    Recent Labs Lab  03/15/13 1530  LIPASE 28   No results found for this basename: AMMONIA,  in the last 168 hours CBC:  Recent Labs Lab 03/15/13 1530 03/16/13 0815  WBC 7.4 7.8  NEUTROABS 6.1  --   HGB 6.6* 9.5*  HCT 19.5* 27.3*  MCV 91.1 89.8  PLT 147* 162   Cardiac Enzymes:  Recent Labs Lab 03/15/13 2150 03/16/13 0815 03/16/13 1400  TROPONINI <0.30 <0.30 <0.30   BNP:  Recent Labs Lab 03/15/13 1530  PROBNP 8133.0*   D-Dimer: No results found for this basename: DDIMER,  in the last 168 hours CBG:  Recent Labs Lab 03/15/13 1313 03/15/13 1520 03/15/13 2014 03/15/13 2309 03/16/13 0809 03/16/13 1208  GLUCAP 113* 114* 160* 152* 129* 135*   Hemoglobin A1C: No results found for this basename: HGBA1C,  in the last 168 hours Fasting Lipid Panel: No results found for this basename: CHOL, HDL, LDLCALC, TRIG, CHOLHDL, LDLDIRECT,  in the last 168 hours Thyroid Function Tests: No results found for this basename: TSH, T4TOTAL, FREET4, T3FREE, THYROIDAB,  in the last 168 hours Coagulation: No results found for this basename: LABPROT, INR,  in the last 168 hours Anemia Panel: No results found for this basename: VITAMINB12, FOLATE, FERRITIN, TIBC, IRON, RETICCTPCT,  in the last 168 hours Urine  Drug Screen: Drugs of Abuse  No results found for this basename: labopia, cocainscrnur, labbenz, amphetmu, thcu, labbarb    Alcohol Level: No results found for this basename: ETH,  in the last 168 hours Urinalysis:  Recent Labs Lab 03/15/13 1745  COLORURINE YELLOW  LABSPEC 1.011  PHURINE 5.5  GLUCOSEU NEGATIVE  HGBUR NEGATIVE  BILIRUBINUR NEGATIVE  KETONESUR NEGATIVE  PROTEINUR >300*  UROBILINOGEN 0.2  NITRITE NEGATIVE  LEUKOCYTESUR NEGATIVE   Micro Results: Recent Results (from the past 240 hour(s))  MRSA PCR SCREENING     Status: None   Collection Time    03/15/13  9:31 PM      Result Value Range Status   MRSA by PCR NEGATIVE  NEGATIVE Final   Comment:            The  GeneXpert MRSA Assay (FDA     approved for NASAL specimens     only), is one component of a     comprehensive MRSA colonization     surveillance program. It is not     intended to diagnose MRSA     infection nor to guide or     monitor treatment for     MRSA infections.   Studies/Results: Ct Head Wo Contrast  03/15/2013   CLINICAL DATA:  Altered mental status, hypoglycemic the  EXAM: CT HEAD WITHOUT CONTRAST  TECHNIQUE: Contiguous axial images were obtained from the base of the skull through the vertex without intravenous contrast.  COMPARISON:  None.  FINDINGS: No acute intracranial hemorrhage or large vessel territory infarct is identified. Gray-white matter differentiation is maintained. No mass or midline shift. CSF containing spaces are within normal limits without evidence of hydrocephalus. No extra-axial fluid collection.  Calvarium is intact.  Orbital soft tissues are within normal limits.  Paranasal sinuses and mastoid air cells are clear.  IMPRESSION: Normal head CT with no acute intracranial process.   Electronically Signed   By: Rise Mu M.D.   On: 03/15/2013 19:24   Dg Chest Port 1 View  03/15/2013   CLINICAL DATA:  Chest pain  EXAM: PORTABLE CHEST - 1 VIEW  COMPARISON:  03/03/2013  FINDINGS: Cardiac enlargement stable. Vascular calcification of the aortic arch stable. Dual lumen right-sided central line unchanged.  There is vascular congestion. There is mild hazy density overlying the left lung, possibly due to patient rotation. No focal consolidation or definite evidence of pulmonary edema.  IMPRESSION: Vascular congestion.  No focal consolidation.   Electronically Signed   By: Esperanza Heir M.D.   On: 03/15/2013 14:25   Medications: I have reviewed the patient's current medications. Scheduled Meds: . cloNIDine  0.1 mg Oral Daily  . insulin aspart  0-9 Units Subcutaneous TID WC  . pantoprazole  40 mg Oral BID   Continuous Infusions:  PRN Meds:.ondansetron (ZOFRAN)  IV, ondansetron Assessment/Plan: Principal Problem:   GI bleed Active Problems:   Anemia, chronic disease   DM (diabetes mellitus)   Chronic kidney disease (CKD), stage IV (severe)   OSA on CPAP  Assessment: 57 year old woman with pmh of insulin dependent T2DM, CKD Stage IV, CHF w/pEF, and OSA on CPAP who presents with lethargy, CP, and epigastric pain that began less than 24 hours ago and found to be hypothermic with Hg 6.6, positive FOBT without active bleeding.   Plan   Symptomatic anemia - Improved Most likely due to upper GI bleed. BUN/Cr 25. Patient with Hg on admission of 6.6 with baseline Hg 8. She is hemodynamically  stable with no signs of active bleeding, however FOBT positive indicating GI bleed. No reports of melena, hematochezia, or hematemesis. She recently started on iron and is ASA. No NSAID use. She was recently admitted and d/c on 10/26 for ARF and acute anemia as low as 6.4 and returned to baseline after 3 units PRBCs. EGD showed scattered gastric ulceration and no colonoscopy was done at that time. Last colonoscopy not in epic per daughter in April with polyps that were removed. Pt received 250 cc bolus in ED.  -Administer 2Units PRBCs for goal Hg>8 --> Hg 9.5  -Appreciate GI consult - repeat EGD not warranted  -Obtain medical records of past colonoscopy - May 2014 with 2 polyps   Epigastric Pain - Improved. Most likely due to gastric antral ulcer bleeding considering symptomatic anemia and positive FOBT. Lipase within normal, so acute pancreatitis less likely. Patient with history of GERD on omeprazole at home.  -Advance diet as tolerated  -Consider CT abdomen if epigastric pain continues  -Zofran PRN nausea  -PO protonix 40 mg BID    AKI on CKD stage IV - not improved, Cr 3.5 (baseline 2.5) Most likely due to prerenal azotemia (decreased renal perfusion) vs ATN (ischmemia due to hypotension) in setting of possible GI blood loss and diuretic use. Pt had recent AV fistula  placed on 10/26 in anticipation of HD and received 3 HD sessions during last admission. Baseline Cr 2.5. emergent need for HD.  -Continue to monitor BMP  AMS - Resolved. Pt was eating and then sudden weakness no other deficits. No focal neuro deficits.Per daughter patient with stroke in May with residual left sided weakness. SIRS with hypothermia and tachypnea but no frank leukocytosis although neutrophilia. Lactic acid within normal limits. CXR without sign of cardiopulmonary disease. Initally hypoglycemic to 60s that responded to D50. Initial trop negative and no EKG changes.  -CT head --> no acute intracranial process  -Obtain blood cultures x 2  -Obtain UA --> proteinuria -Neuro checks  -Trend troponin   Chronic diastolic HF - She has history of RHF/anasarca with pulmonary HTN 01/2012. She does not appear volume overloaded, although pt had recent increase in lasix to 160mg  for the past 2 days per a reported 3-10lb weight gain per daughter. Last echo 10/14 EF 65% with LVH and bilaterally dilated atrium and diastolic filling impairment. CXR with some vascular congestion and slightly elevated pro-bnp 8000 from 7000s.  -Consider lasix if becomes dyspneic in setting of transfusion  -Holding anti-hypertensives in setting of GI bleed  -Continue home clonidine   Insulin-dependent Type II DM - Pt with last HgbA1c 7.7 on 10/14. Glucose on admission was 113 however per EMS on arrival glucose in 60s with no anion gap. Pt on 20 U daily Lantus and sliding scale at home.  -Insulin sliding scale  -Glucose monitoring x 4 daily   Thrombocytopenia - Patient with platelet of 147K on admission. On discharge 1 week ago 180K.  -Monitor for bleeding  -Monitor CBC   Obstructive Sleep Apnea - Patient with history of OSA on home CPAP  -Cont CPAP    Dispo: Disposition is deferred at this time, awaiting improvement of current medical problems.  Anticipated discharge in approximately 1-2 day(s).   The patient does  have a current PCP Barbie Banner, MD) and does need an [facility] hospital follow-up appointment after discharge.  The patient does have transportation limitations that hinder transportation to clinic appointments.  .Services Needed at time of discharge: Y = Yes,  Blank = No PT:   OT:   RN:   Equipment:   Other:     LOS: 1 day   Otis Brace, MD 03/16/2013, 4:46 PM

## 2013-03-16 NOTE — Progress Notes (Signed)
ATTEMPT TO CALL REPORT TO UNIT 3W. NURSE CURRENTLY TRANSFERRING ANOTHER PATIENT

## 2013-03-16 NOTE — Progress Notes (Signed)
[facility] Progress Note  Subjective: No new complaints, no stools  Objective: Vital signs in last 24 hours: Temp:  [92.3 F (33.5 C)-98.7 F (37.1 C)] 98.6 F (37 C) (11/06 0620) Pulse Rate:  [46-63] 58 (11/06 0620) Resp:  [7-23] 12 (11/06 0620) BP: (111-188)/(49-100) 188/77 mmHg (11/06 0620) SpO2:  [96 %-100 %] 98 % (11/06 0620) Weight:  [73 kg (160 lb 15 oz)] 73 kg (160 lb 15 oz) (11/05 2100) Weight change:    PE: Unchanged  Lab Results: Results for orders placed during the hospital encounter of 03/15/13 (from the past 24 hour(s))  GLUCOSE, CAPILLARY     Status: Abnormal   Collection Time    03/15/13  1:13 PM      Result Value Range   Glucose-Capillary 113 (*) 70 - 99 mg/dL  GLUCOSE, CAPILLARY     Status: Abnormal   Collection Time    03/15/13  3:20 PM      Result Value Range   Glucose-Capillary 114 (*) 70 - 99 mg/dL  COMPREHENSIVE METABOLIC PANEL     Status: Abnormal   Collection Time    03/15/13  3:30 PM      Result Value Range   Sodium 134 (*) 135 - 145 mEq/L   Potassium 4.4  3.5 - 5.1 mEq/L   Chloride 97  96 - 112 mEq/L   CO2 26  19 - 32 mEq/L   Glucose, Bld 123 (*) 70 - 99 mg/dL   BUN 88 (*) 6 - 23 mg/dL   Creatinine, Ser 1.61 (*) 0.50 - 1.10 mg/dL   Calcium 8.0 (*) 8.4 - 10.5 mg/dL   Total Protein 5.9 (*) 6.0 - 8.3 g/dL   Albumin 2.1 (*) 3.5 - 5.2 g/dL   AST 56 (*) 0 - 37 U/L   ALT 27  0 - 35 U/L   Alkaline Phosphatase 138 (*) 39 - 117 U/L   Total Bilirubin 0.2 (*) 0.3 - 1.2 mg/dL   GFR calc non Af Amer 14 (*) >90 mL/min   GFR calc Af Amer 16 (*) >90 mL/min  LIPASE, BLOOD     Status: None   Collection Time    03/15/13  3:30 PM      Result Value Range   Lipase 28  11 - 59 U/L  CBC WITH DIFFERENTIAL     Status: Abnormal   Collection Time    03/15/13  3:30 PM      Result Value Range   WBC 7.4  4.0 - 10.5 K/uL   RBC 2.14 (*) 3.87 - 5.11 MIL/uL   Hemoglobin 6.6 (*) 12.0 - 15.0 g/dL   HCT 09.6 (*) 04.5 - 40.9 %   MCV 91.1  78.0 - 100.0  fL   MCH 30.8  26.0 - 34.0 pg   MCHC 33.8  30.0 - 36.0 g/dL   RDW 81.1 (*) 91.4 - 78.2 %   Platelets 147 (*) 150 - 400 K/uL   Neutrophils Relative % 83 (*) 43 - 77 %   Neutro Abs 6.1  1.7 - 7.7 K/uL   Lymphocytes Relative 8 (*) 12 - 46 %   Lymphs Abs 0.6 (*) 0.7 - 4.0 K/uL   Monocytes Relative 8  3 - 12 %   Monocytes Absolute 0.6  0.1 - 1.0 K/uL   Eosinophils Relative 1  0 - 5 %   Eosinophils Absolute 0.1  0.0 - 0.7 K/uL   Basophils Relative 0  0 - 1 %   Basophils  Absolute 0.0  0.0 - 0.1 K/uL  TROPONIN I     Status: None   Collection Time    03/15/13  3:30 PM      Result Value Range   Troponin I <0.30  <0.30 ng/mL  PRO B NATRIURETIC PEPTIDE     Status: Abnormal   Collection Time    03/15/13  3:30 PM      Result Value Range   Pro B Natriuretic peptide (BNP) 8133.0 (*) 0 - 125 pg/mL  CG4 I-STAT (LACTIC ACID)     Status: None   Collection Time    03/15/13  4:09 PM      Result Value Range   Lactic Acid, Venous 0.82  0.5 - 2.2 mmol/L  URINALYSIS, ROUTINE W REFLEX MICROSCOPIC     Status: Abnormal   Collection Time    03/15/13  5:45 PM      Result Value Range   Color, Urine YELLOW  YELLOW   APPearance CLEAR  CLEAR   Specific Gravity, Urine 1.011  1.005 - 1.030   pH 5.5  5.0 - 8.0   Glucose, UA NEGATIVE  NEGATIVE mg/dL   Hgb urine dipstick NEGATIVE  NEGATIVE   Bilirubin Urine NEGATIVE  NEGATIVE   Ketones, ur NEGATIVE  NEGATIVE mg/dL   Protein, ur >284 (*) NEGATIVE mg/dL   Urobilinogen, UA 0.2  0.0 - 1.0 mg/dL   Nitrite NEGATIVE  NEGATIVE   Leukocytes, UA NEGATIVE  NEGATIVE  URINE MICROSCOPIC-ADD ON     Status: Abnormal   Collection Time    03/15/13  5:45 PM      Result Value Range   Squamous Epithelial / LPF RARE  RARE   WBC, UA 0-2  <3 WBC/hpf   RBC / HPF 0-2  <3 RBC/hpf   Bacteria, UA FEW (*) RARE  OCCULT BLOOD, POC DEVICE     Status: Abnormal   Collection Time    03/15/13  5:58 PM      Result Value Range   Fecal Occult Bld POSITIVE (*) NEGATIVE  PREPARE RBC  (CROSSMATCH)     Status: None   Collection Time    03/15/13  6:13 PM      Result Value Range   Order Confirmation ORDER PROCESSED BY BLOOD BANK    TYPE AND SCREEN     Status: None   Collection Time    03/15/13  6:13 PM      Result Value Range   ABO/RH(D) O POS     Antibody Screen POS     Sample Expiration 03/18/2013     DAT, IgG NEG     Antibody Identification ANTI-M     Unit Number [id number]     Blood Component Type RED CELLS,LR     Unit division 00     Status of Unit ISSUED     Transfusion Status OK TO TRANSFUSE     Crossmatch Result COMPATIBLE     Unit Number [id number]     Blood Component Type RED CELLS,LR     Unit division 00     Status of Unit ISSUED     Transfusion Status OK TO TRANSFUSE     Crossmatch Result COMPATIBLE     Unit Number [id number]     Blood Component Type RED CELLS,LR     Unit division 00     Status of Unit ALLOCATED     Transfusion Status OK TO TRANSFUSE     Crossmatch Result COMPATIBLE     Unit  Number [id number]     Blood Component Type RED CELLS,LR     Unit division 00     Status of Unit ALLOCATED     Transfusion Status OK TO TRANSFUSE     Crossmatch Result COMPATIBLE    GLUCOSE, CAPILLARY     Status: Abnormal   Collection Time    03/15/13  8:14 PM      Result Value Range   Glucose-Capillary 160 (*) 70 - 99 mg/dL  MRSA PCR SCREENING     Status: None   Collection Time    03/15/13  9:31 PM      Result Value Range   MRSA by PCR NEGATIVE  NEGATIVE  TROPONIN I     Status: None   Collection Time    03/15/13  9:50 PM      Result Value Range   Troponin I <0.30  <0.30 ng/mL  GLUCOSE, CAPILLARY     Status: Abnormal   Collection Time    03/15/13 11:09 PM      Result Value Range   Glucose-Capillary 152 (*) 70 - 99 mg/dL   Comment 1 Documented in Chart     Comment 2 Notify RN      Studies/Results: Ct Head Wo Contrast  03/15/2013   CLINICAL DATA:  Altered mental status, hypoglycemic the  EXAM: CT HEAD WITHOUT CONTRAST   TECHNIQUE: Contiguous axial images were obtained from the base of the skull through the vertex without intravenous contrast.  COMPARISON:  None.  FINDINGS: No acute intracranial hemorrhage or large vessel territory infarct is identified. Gray-white matter differentiation is maintained. No mass or midline shift. CSF containing spaces are within normal limits without evidence of hydrocephalus. No extra-axial fluid collection.  Calvarium is intact.  Orbital soft tissues are within normal limits.  Paranasal sinuses and mastoid air cells are clear.  IMPRESSION: Normal head CT with no acute intracranial process.   Electronically Signed   By: Rise Mu M.D.   On: 03/15/2013 19:24   Dg Chest Port 1 View  03/15/2013   CLINICAL DATA:  Chest pain  EXAM: PORTABLE CHEST - 1 VIEW  COMPARISON:  03/03/2013  FINDINGS: Cardiac enlargement stable. Vascular calcification of the aortic arch stable. Dual lumen right-sided central line unchanged.  There is vascular congestion. There is mild hazy density overlying the left lung, possibly due to patient rotation. No focal consolidation or definite evidence of pulmonary edema.  IMPRESSION: Vascular congestion.  No focal consolidation.   Electronically Signed   By: Esperanza Heir M.D.   On: 03/15/2013 14:25      Assessment: Anemia and heme positive stools with recent diagnosis of gastric ulcer. Colonoscopy in May of this year reportedly showed only 2 polyps  Plan: Posttransfusion hemoglobin pending. Continue proton pump inhibitor. Doubt repeat endoscopy would be clearly helpful.    Dirk Vanaman C 03/16/2013, 6:59 AM

## 2013-03-16 NOTE — Progress Notes (Signed)
Report called to [facility] RN on unit 3W. Patient to be transported to #[id number] via bed.

## 2013-03-16 NOTE — H&P (Signed)
  Date: 03/16/2013  Patient name: Sarye Weniger  Medical record number: [id number]  Date of birth: 03-06-1956   I have seen and evaluated Darika Nevitt and discussed their care with the Residency Team.   Assessment and Plan: I have seen and evaluated the patient as outlined above. I agree with the formulated Assessment and Plan as detailed in the residents' admission note, with the following changes:   1. Acute on chronic anemia - likely 2/2 UGI bleed. HgB post transfusion was 9.5. Will need to cont to follow to ensure she doesn't trend down. Pt has had both UGI and colon this year and GI sees no utility in repeating them at this time. Will cont PPI and follow HgB. Her chronic anemia is likely 2/2 chronic renal dz and she is on outpt feSO4 but not EPO yet. Stable for transfer to floor.   Burns Spain, MD 11/6/20144:53 PM

## 2013-03-16 NOTE — Progress Notes (Signed)
Pt BP trending up throughout the night.  This AM, BP is 188/77.  Pt has not taken home BP meds since yesterday AM.  Pharmacy notified to complete PTA med list.  MD notified. No new orders at this time. Will continue to monitor.  M.Foster Simpson, RN

## 2013-03-16 NOTE — Progress Notes (Signed)
[facility]  Patient Status: Active (receiving services up to time of hospitalization)  [facility] is providing the following services: RN  If patient discharges after hours, please call [phone].   Madeline [name] 03/16/2013, 10:41 AM

## 2013-03-16 NOTE — Progress Notes (Signed)
Patient refused to wear CPAP tonight.  Was told if she changed her mind to call RT. 

## 2013-03-17 LAB — GLUCOSE, CAPILLARY: Glucose-Capillary: 257 mg/dL — ABNORMAL HIGH (ref 70–99)

## 2013-03-17 LAB — CBC
HCT: 27.2 % — ABNORMAL LOW (ref 36.0–46.0)
Hemoglobin: 9.4 g/dL — ABNORMAL LOW (ref 12.0–15.0)
Hemoglobin: 9.2 g/dL — ABNORMAL LOW (ref 12.0–15.0)
MCV: 90.9 fL (ref 78.0–100.0)
MCV: 89.8 fL (ref 78.0–100.0)
Platelets: 162 10*3/uL (ref 150–400)
RBC: 3.08 MIL/uL — ABNORMAL LOW (ref 3.87–5.11)
RBC: 3.03 MIL/uL — ABNORMAL LOW (ref 3.87–5.11)
RDW: 16.3 % — ABNORMAL HIGH (ref 11.5–15.5)
WBC: 7.7 10*3/uL (ref 4.0–10.5)
WBC: 9.3 10*3/uL (ref 4.0–10.5)

## 2013-03-17 LAB — BASIC METABOLIC PANEL
CO2: 24 mEq/L (ref 19–32)
Chloride: 101 mEq/L (ref 96–112)
Creatinine, Ser: 3.36 mg/dL — ABNORMAL HIGH (ref 0.50–1.10)
GFR calc Af Amer: 17 mL/min — ABNORMAL LOW (ref >90)
Glucose, Bld: 241 mg/dL — ABNORMAL HIGH (ref 70–99)
Potassium: 4.1 mEq/L (ref 3.5–5.1)

## 2013-03-17 MED ORDER — CLONIDINE HCL 0.1 MG PO TABS
0.1000 mg | ORAL_TABLET | Freq: Every day | ORAL | Status: DC
  Administered 2013-03-17 – 2013-03-18 (×2): 0.1 mg via ORAL
  Filled 2013-03-17 (×4): qty 1

## 2013-03-17 MED ORDER — ISOSORBIDE MONONITRATE ER 60 MG PO TB24: 120.0000 mg | ORAL_TABLET | Freq: Every day | ORAL | Status: DC

## 2013-03-17 MED ORDER — AMLODIPINE BESYLATE 10 MG PO TABS
10.0000 mg | ORAL_TABLET | Freq: Every day | ORAL | Status: DC
  Administered 2013-03-17 – 2013-03-18 (×2): 10 mg via ORAL
  Filled 2013-03-17 (×4): qty 1

## 2013-03-17 MED ORDER — AMLODIPINE BESYLATE 10 MG PO TABS
10.0000 mg | ORAL_TABLET | Freq: Every day | ORAL | Status: DC
  Filled 2013-03-17: qty 1

## 2013-03-17 MED ORDER — ISOSORBIDE MONONITRATE ER 60 MG PO TB24
120.0000 mg | ORAL_TABLET | Freq: Every day | ORAL | Status: DC
  Administered 2013-03-17 – 2013-03-18 (×2): 120 mg via ORAL
  Filled 2013-03-17 (×2): qty 2

## 2013-03-17 MED ORDER — HYDRALAZINE HCL 50 MG PO TABS
100.0000 mg | ORAL_TABLET | Freq: Three times a day (TID) | ORAL | Status: DC
  Filled 2013-03-17 (×3): qty 2

## 2013-03-17 MED ORDER — ACETAMINOPHEN 325 MG PO TABS
650.0000 mg | ORAL_TABLET | Freq: Four times a day (QID) | ORAL | Status: DC | PRN
  Administered 2013-03-17 – 2013-03-18 (×3): 650 mg via ORAL
  Filled 2013-03-17 (×3): qty 2

## 2013-03-17 MED ORDER — HYDRALAZINE HCL 50 MG PO TABS
100.0000 mg | ORAL_TABLET | Freq: Three times a day (TID) | ORAL | Status: DC
  Administered 2013-03-17 – 2013-03-18 (×4): 100 mg via ORAL
  Filled 2013-03-17 (×7): qty 2

## 2013-03-17 MED ORDER — FUROSEMIDE 80 MG PO TABS
160.0000 mg | ORAL_TABLET | Freq: Two times a day (BID) | ORAL | Status: DC
  Administered 2013-03-17 – 2013-03-18 (×3): 160 mg via ORAL
  Filled 2013-03-17 (×6): qty 2

## 2013-03-17 NOTE — Progress Notes (Signed)
MD. Notified of patients blood pressure being elevated. No new orders received. Will continue to monitor.

## 2013-03-17 NOTE — Progress Notes (Signed)
11/7  CBGs on 11/6: 124-135-215-161 mg/dl                69/6:  295-284 mg/dl Recommend starting Lantus 10 units daily and titrate as needed if CBGs continue to be greater than 180 mg/dl.  Continue Novolog SENSITIVE correction scale TID. Will continue to follow while in hospital.  Smith Mince RN BSN CDE

## 2013-03-17 NOTE — Progress Notes (Addendum)
Subjective:  Pt seen and examined in AM.  Patient with elevated blood pressures and per nursing report black and loose stools. She reports taking iron at home She is no longer weak or confused. She is alert and orientated and states she is feeling well. Reports 3/10 abdominal pain. No nausea, vomiting, dyspnea, CP, or lightheadedness.     Objective: Vital signs in last 24 hours: Filed Vitals:   03/17/13 0838 03/17/13 0923 03/17/13 1031 03/17/13 1154  BP: 223/71 218/72 198/66 188/59  Pulse:  70 70 70  Temp:      TempSrc:      Resp:      Height:      Weight:      SpO2:       Weight change: -0.379 kg (-13.4 oz)  Intake/Output Summary (Last 24 hours) at 03/17/13 1240 Last data filed at 03/17/13 0900  Gross per 24 hour  Intake    840 ml  Output      0 ml  Net    840 ml   Constitutional: She is oriented to person, place, and time. She is not distressed  Head: Normocephalic and atraumatic.  Eyes: EOM are normal.  Neck: Normal range of motion. Neck supple.  Cardiovascular: Regular rhythm and normal heart sounds.No murmur heard. Pulmonary/Chest: Effort normal and breath sounds normal. No respiratory distress. She has no wheezes. She has no rales.   Abdominal: Soft. Bowel sounds are normal. There is no tenderness. There is no rebound and no guarding.  Musculoskeletal: Normal range of motion. She exhibits no edema and no tenderness.  Neurological: She is alert and oriented to person, place, and time.  Skin: Skin is warm and dry. She is not diaphoretic.  Psychiatric: She has a normal mood and affect. Her behavior is normal.   Lab Results: Basic Metabolic Panel:  Recent Labs Lab 03/16/13 0815 03/17/13 0350  NA 138 140  K 4.8 4.1  CL 97 101  CO2 24 24  GLUCOSE 124* 241*  BUN 86* 86*  CREATININE 3.50* 3.36*  CALCIUM 8.6 8.3*   Liver Function Tests:  Recent Labs Lab 03/15/13 1530  AST 56*  ALT 27  ALKPHOS 138*  BILITOT 0.2*  PROT 5.9*  ALBUMIN 2.1*     Recent Labs Lab 03/15/13 1530  LIPASE 28   No results found for this basename: AMMONIA,  in the last 168 hours CBC:  Recent Labs Lab 03/15/13 1530  03/16/13 1925 03/17/13 0350  WBC 7.4  < > 7.3 7.7  NEUTROABS 6.1  --   --   --   HGB 6.6*  < > 9.7* 9.4*  HCT 19.5*  < > 27.7* 28.0*  MCV 91.1  < > 89.1 90.9  PLT 147*  < > 172 162  < > = values in this interval not displayed. Cardiac Enzymes:  Recent Labs Lab 03/15/13 2150 03/16/13 0815 03/16/13 1400  TROPONINI <0.30 <0.30 <0.30   BNP:  Recent Labs Lab 03/15/13 1530  PROBNP 8133.0*   D-Dimer: No results found for this basename: DDIMER,  in the last 168 hours CBG:  Recent Labs Lab 03/16/13 0809 03/16/13 1208 03/16/13 1659 03/16/13 2007 03/17/13 0725 03/17/13 1140  GLUCAP 129* 135* 215* 161* 257* 394*   Hemoglobin A1C: No results found for this basename: HGBA1C,  in the last 168 hours Fasting Lipid Panel: No results found for this basename: CHOL, HDL, LDLCALC, TRIG, CHOLHDL, LDLDIRECT,  in the last 168 hours Thyroid Function Tests: No  results found for this basename: TSH, T4TOTAL, FREET4, T3FREE, THYROIDAB,  in the last 168 hours Coagulation: No results found for this basename: LABPROT, INR,  in the last 168 hours Anemia Panel: No results found for this basename: VITAMINB12, FOLATE, FERRITIN, TIBC, IRON, RETICCTPCT,  in the last 168 hours Urine Drug Screen: Drugs of Abuse  No results found for this basename: labopia,  cocainscrnur,  labbenz,  amphetmu,  thcu,  labbarb    Alcohol Level: No results found for this basename: ETH,  in the last 168 hours Urinalysis:  Recent Labs Lab 03/15/13 1745  COLORURINE YELLOW  LABSPEC 1.011  PHURINE 5.5  GLUCOSEU NEGATIVE  HGBUR NEGATIVE  BILIRUBINUR NEGATIVE  KETONESUR NEGATIVE  PROTEINUR >300*  UROBILINOGEN 0.2  NITRITE NEGATIVE  LEUKOCYTESUR NEGATIVE   Micro Results: Recent Results (from the past 240 hour(s))  CULTURE, BLOOD (ROUTINE X 2)      Status: None   Collection Time    03/15/13  3:55 PM      Result Value Range Status   Specimen Description BLOOD HAND RIGHT   Final   Special Requests BOTTLES DRAWN AEROBIC ONLY 10CC   Final   Culture  Setup Time     Final   Value: 03/16/2013 00:28     Performed at [facility]   Culture     Final   Value:        BLOOD CULTURE RECEIVED NO GROWTH TO DATE CULTURE WILL BE HELD FOR 5 DAYS BEFORE ISSUING A FINAL NEGATIVE REPORT     Performed at [facility]   Report Status PENDING   Incomplete  CULTURE, BLOOD (ROUTINE X 2)     Status: None   Collection Time    03/15/13  4:00 PM      Result Value Range Status   Specimen Description BLOOD HAND RIGHT   Final   Special Requests BOTTLES DRAWN AEROBIC ONLY 5CC   Final   Culture  Setup Time     Final   Value: 03/16/2013 00:28     Performed at [facility]   Culture     Final   Value:        BLOOD CULTURE RECEIVED NO GROWTH TO DATE CULTURE WILL BE HELD FOR 5 DAYS BEFORE ISSUING A FINAL NEGATIVE REPORT     Performed at [facility]   Report Status PENDING   Incomplete  MRSA PCR SCREENING     Status: None   Collection Time    03/15/13  9:31 PM      Result Value Range Status   MRSA by PCR NEGATIVE  NEGATIVE Final   Comment:            The GeneXpert MRSA Assay (FDA     approved for NASAL specimens     only), is one component of a     comprehensive MRSA colonization     surveillance program. It is not     intended to diagnose MRSA     infection nor to guide or     monitor treatment for     MRSA infections.   Studies/Results: Ct Head Wo Contrast  03/15/2013   CLINICAL DATA:  Altered mental status, hypoglycemic the  EXAM: CT HEAD WITHOUT CONTRAST  TECHNIQUE: Contiguous axial images were obtained from the base of the skull through the vertex without intravenous contrast.  COMPARISON:  None.  FINDINGS: No acute intracranial hemorrhage or large vessel territory infarct is identified. Gray-white matter  differentiation  is maintained. No mass or midline shift. CSF containing spaces are within normal limits without evidence of hydrocephalus. No extra-axial fluid collection.  Calvarium is intact.  Orbital soft tissues are within normal limits.  Paranasal sinuses and mastoid air cells are clear.  IMPRESSION: Normal head CT with no acute intracranial process.   Electronically Signed   By: Rise Mu M.D.   On: 03/15/2013 19:24   Dg Chest Port 1 View  03/15/2013   CLINICAL DATA:  Chest pain  EXAM: PORTABLE CHEST - 1 VIEW  COMPARISON:  03/03/2013  FINDINGS: Cardiac enlargement stable. Vascular calcification of the aortic arch stable. Dual lumen right-sided central line unchanged.  There is vascular congestion. There is mild hazy density overlying the left lung, possibly due to patient rotation. No focal consolidation or definite evidence of pulmonary edema.  IMPRESSION: Vascular congestion.  No focal consolidation.   Electronically Signed   By: Esperanza Heir M.D.   On: 03/15/2013 14:25   Medications: I have reviewed the patient's current medications. Scheduled Meds: . amLODipine  10 mg Oral Daily  . cloNIDine  0.1 mg Oral Daily  . furosemide  160 mg Oral BID  . hydrALAZINE  100 mg Oral TID  . insulin aspart  0-9 Units Subcutaneous TID WC  . isosorbide mononitrate  120 mg Oral Daily  . pantoprazole  40 mg Oral BID   Continuous Infusions:  PRN Meds:.acetaminophen, ondansetron (ZOFRAN) IV, ondansetron Assessment/Plan: Principal Problem:   GI bleed Active Problems:   Anemia, chronic disease   DM (diabetes mellitus)   Chronic kidney disease (CKD), stage IV (severe)   OSA on CPAP  Assessment: 57 year old woman with pmh of insulin dependent T2DM, CKD Stage IV, CHF w/pEF, and OSA on CPAP who presents with lethargy, CP, and epigastric pain that began less than 24 hours ago and found to be hypothermic with Hg 6.6, positive FOBT without active bleeding.   Plan   Symptomatic anemia - Improved  Most likely due to upper GI bleed. BUN/Cr 25. Patient with Hg on admission of 6.6 with baseline Hg 8. She is hemodynamically stable with no signs of active bleeding, however FOBT positive indicating GI bleed. No reports of melena, hematochezia, or hematemesis. She recently started on iron and is ASA. No NSAID use. She was recently admitted and d/c on 10/26 for ARF and acute anemia as low as 6.4 and returned to baseline after 3 units PRBCs. EGD showed scattered gastric ulceration and no colonoscopy was done at that time. Last colonoscopy not in epic per daughter in April with polyps that were removed. Pt received 250 cc bolus in ED.  -Administer 2Units PRBCs for goal Hg>8 --> Hg 9.5 --> 9.7--> 9.4  -Appreciate GI consult - repeat EGD not warranted  -Obtain medical records of past colonoscopy - May 2014 with 2 polyps  -Repeat FOBT -Per GI to continue treatment for PUD with protonix BID. If Hg drop at follow-up appt with + FOBT may need capsule endoscopy.  -Restart iron supplementation    Epigastric Pain - Improved. Most likely due to gastric antral ulcer bleeding considering symptomatic anemia and positive FOBT. Lipase within normal, so acute pancreatitis less likely. Patient with history of GERD on omeprazole at home.  -Advance diet as tolerated  -Consider CT abdomen if epigastric pain continues  -Zofran PRN nausea  -PO protonix 40 mg BID    AKI on CKD stage IV - improving. Cr 3.5 to 3.36 (baseline 2.5) Most likely due to prerenal azotemia (decreased renal  perfusion) vs ATN (ischmemia due to hypotension) in setting of possible GI blood loss and diuretic use. Pt had recent AV fistula placed on 10/26 in anticipation of HD and received 3 HD sessions during last admission. Baseline Cr 2.5. No need for HD.  -Continue to monitor BMP  AMS - Resolved. Pt was eating and then sudden weakness no other deficits. No focal neuro deficits.Per daughter patient with stroke in May with residual left sided weakness.  SIRS with hypothermia and tachypnea but no frank leukocytosis although neutrophilia. Lactic acid within normal limits. CXR without sign of cardiopulmonary disease. Initally hypoglycemic to 60s that responded to D50. Initial trop negative and no EKG changes.  -CT head --> no acute intracranial process  -Obtain blood cultures x 2 - NGTD -Obtain UA --> proteinuria -Neuro checks  -Trend troponin   Chronic diastolic HF - She has history of RHF/anasarca with pulmonary HTN 01/2012. She does not appear volume overloaded, although pt had recent increase in lasix to 160mg  for the past 2 days per a reported 3-10lb weight gain per daughter. Last echo 10/14 EF 65% with LVH and bilaterally dilated atrium and diastolic filling impairment. CXR with some vascular congestion and slightly elevated pro-bnp 8000 from 7000s.  -Continue home amlodipine, lasix, clonidine, imdur, hydralazine    Insulin-dependent Type II DM - Pt with last HgbA1c 7.7 on 10/14. Glucose on admission was 113 however per EMS on arrival glucose in 60s with no anion gap. Pt on 20 U daily Lantus and sliding scale at home.  -Insulin sliding scale  -Glucose monitoring x 4 daily   Obstructive Sleep Apnea - Patient with history of OSA on home CPAP  -Cont CPAP   Thrombocytopenia - resolved.  Patient with platelet of 147K on admission. On discharge 1 week ago 180K.  -Monitor for bleeding  -Monitor CBC    Dispo: Disposition is deferred at this time, awaiting improvement of current medical problems.  Anticipated discharge in approximately 1-2 day(s).   The patient does have a current PCP Barbie Banner, MD) and does need an [facility] hospital follow-up appointment after discharge.  The patient does have transportation limitations that hinder transportation to clinic appointments.  .Services Needed at time of discharge: Y = Yes, Blank = No PT:   OT:   RN:   Equipment:   Other:     LOS: 2 days   Otis Brace, MD 03/17/2013, 12:40 PM

## 2013-03-17 NOTE — Progress Notes (Signed)
Pt's daughter updated about pt's status. Pt stated it was okay to share this information. Sanda Linger, RN

## 2013-03-17 NOTE — Progress Notes (Signed)
Patient refused to wear CPAP tonight.  Was told if she changed her mind to call RT. 

## 2013-03-17 NOTE — Progress Notes (Signed)
[facility] Progress Note  Subjective: Had a dark formed stool this morning  Objective: Vital signs in last 24 hours: Temp:  [98.3 F (36.8 C)-99.5 F (37.5 C)] 99.5 F (37.5 C) (11/07 0559) Pulse Rate:  [61-66] 66 (11/07 0559) Resp:  [15-18] 18 (11/07 0559) BP: (161-206)/(58-77) 206/70 mmHg (11/07 0559) SpO2:  [91 %-100 %] 100 % (11/07 0559) Weight:  [72.621 kg (160 lb 1.6 oz)] 72.621 kg (160 lb 1.6 oz) (11/07 0559) Weight change: -0.379 kg (-13.4 oz)   PE: Unchanged  Lab Results: Results for orders placed during the hospital encounter of 03/15/13 (from the past 24 hour(s))  GLUCOSE, CAPILLARY     Status: Abnormal   Collection Time    03/16/13  8:09 AM      Result Value Range   Glucose-Capillary 129 (*) 70 - 99 mg/dL   Comment 1 Notify RN    BASIC METABOLIC PANEL     Status: Abnormal   Collection Time    03/16/13  8:15 AM      Result Value Range   Sodium 138  135 - 145 mEq/L   Potassium 4.8  3.5 - 5.1 mEq/L   Chloride 97  96 - 112 mEq/L   CO2 24  19 - 32 mEq/L   Glucose, Bld 124 (*) 70 - 99 mg/dL   BUN 86 (*) 6 - 23 mg/dL   Creatinine, Ser 2.95 (*) 0.50 - 1.10 mg/dL   Calcium 8.6  8.4 - 28.4 mg/dL   GFR calc non Af Amer 14 (*) >90 mL/min   GFR calc Af Amer 16 (*) >90 mL/min  CBC     Status: Abnormal   Collection Time    03/16/13  8:15 AM      Result Value Range   WBC 7.8  4.0 - 10.5 K/uL   RBC 3.04 (*) 3.87 - 5.11 MIL/uL   Hemoglobin 9.5 (*) 12.0 - 15.0 g/dL   HCT 13.2 (*) 44.0 - 10.2 %   MCV 89.8  78.0 - 100.0 fL   MCH 31.3  26.0 - 34.0 pg   MCHC 34.8  30.0 - 36.0 g/dL   RDW 72.5 (*) 36.6 - 44.0 %   Platelets 162  150 - 400 K/uL  TROPONIN I     Status: None   Collection Time    03/16/13  8:15 AM      Result Value Range   Troponin I <0.30  <0.30 ng/mL  GLUCOSE, CAPILLARY     Status: Abnormal   Collection Time    03/16/13 12:08 PM      Result Value Range   Glucose-Capillary 135 (*) 70 - 99 mg/dL   Comment 1 Notify RN    TROPONIN I      Status: None   Collection Time    03/16/13  2:00 PM      Result Value Range   Troponin I <0.30  <0.30 ng/mL  GLUCOSE, CAPILLARY     Status: Abnormal   Collection Time    03/16/13  4:59 PM      Result Value Range   Glucose-Capillary 215 (*) 70 - 99 mg/dL   Comment 1 Notify RN    CBC     Status: Abnormal   Collection Time    03/16/13  7:25 PM      Result Value Range   WBC 7.3  4.0 - 10.5 K/uL   RBC 3.11 (*) 3.87 - 5.11 MIL/uL   Hemoglobin 9.7 (*) 12.0 - 15.0  g/dL   HCT 16.1 (*) 09.6 - 04.5 %   MCV 89.1  78.0 - 100.0 fL   MCH 31.2  26.0 - 34.0 pg   MCHC 35.0  30.0 - 36.0 g/dL   RDW 40.9 (*) 81.1 - 91.4 %   Platelets 172  150 - 400 K/uL  GLUCOSE, CAPILLARY     Status: Abnormal   Collection Time    03/16/13  8:07 PM      Result Value Range   Glucose-Capillary 161 (*) 70 - 99 mg/dL  CBC     Status: Abnormal   Collection Time    03/17/13  3:50 AM      Result Value Range   WBC 7.7  4.0 - 10.5 K/uL   RBC 3.08 (*) 3.87 - 5.11 MIL/uL   Hemoglobin 9.4 (*) 12.0 - 15.0 g/dL   HCT 78.2 (*) 95.6 - 21.3 %   MCV 90.9  78.0 - 100.0 fL   MCH 30.5  26.0 - 34.0 pg   MCHC 33.6  30.0 - 36.0 g/dL   RDW 08.6 (*) 57.8 - 46.9 %   Platelets 162  150 - 400 K/uL  BASIC METABOLIC PANEL     Status: Abnormal   Collection Time    03/17/13  3:50 AM      Result Value Range   Sodium 140  135 - 145 mEq/L   Potassium 4.1  3.5 - 5.1 mEq/L   Chloride 101  96 - 112 mEq/L   CO2 24  19 - 32 mEq/L   Glucose, Bld 241 (*) 70 - 99 mg/dL   BUN 86 (*) 6 - 23 mg/dL   Creatinine, Ser 6.29 (*) 0.50 - 1.10 mg/dL   Calcium 8.3 (*) 8.4 - 10.5 mg/dL   GFR calc non Af Amer 14 (*) >90 mL/min   GFR calc Af Amer 17 (*) >90 mL/min    Studies/Results: Ct Head Wo Contrast  03/15/2013   CLINICAL DATA:  Altered mental status, hypoglycemic the  EXAM: CT HEAD WITHOUT CONTRAST  TECHNIQUE: Contiguous axial images were obtained from the base of the skull through the vertex without intravenous contrast.  COMPARISON:  None.   FINDINGS: No acute intracranial hemorrhage or large vessel territory infarct is identified. Gray-white matter differentiation is maintained. No mass or midline shift. CSF containing spaces are within normal limits without evidence of hydrocephalus. No extra-axial fluid collection.  Calvarium is intact.  Orbital soft tissues are within normal limits.  Paranasal sinuses and mastoid air cells are clear.  IMPRESSION: Normal head CT with no acute intracranial process.   Electronically Signed   By: Rise Mu M.D.   On: 03/15/2013 19:24   Dg Chest Port 1 View  03/15/2013   CLINICAL DATA:  Chest pain  EXAM: PORTABLE CHEST - 1 VIEW  COMPARISON:  03/03/2013  FINDINGS: Cardiac enlargement stable. Vascular calcification of the aortic arch stable. Dual lumen right-sided central line unchanged.  There is vascular congestion. There is mild hazy density overlying the left lung, possibly due to patient rotation. No focal consolidation or definite evidence of pulmonary edema.  IMPRESSION: Vascular congestion.  No focal consolidation.   Electronically Signed   By: Esperanza Heir M.D.   On: 03/15/2013 14:25      Assessment: Recurrent anemia and heme positive stool, up-to-date on colonoscopy with small gastric erosion seen on recent EGD.  Plan: Would continue to treat his peptic ulcer disease as long as hemoglobin stable. Next clinic followup and if drops  hemoglobin again with documented heme positive stools may need capsule endoscopy. We'll sign off for now. Thank you for the consult.    Leisl Spurrier C 03/17/2013, 6:33 AM

## 2013-03-18 LAB — BASIC METABOLIC PANEL
BUN: 80 mg/dL — ABNORMAL HIGH (ref 6–23)
CO2: 27 mEq/L (ref 19–32)
Calcium: 7.9 mg/dL — ABNORMAL LOW (ref 8.4–10.5)
Chloride: 99 mEq/L (ref 96–112)
Creatinine, Ser: 3.24 mg/dL — ABNORMAL HIGH (ref 0.50–1.10)
GFR calc Af Amer: 17 mL/min — ABNORMAL LOW (ref >90)
Glucose, Bld: 189 mg/dL — ABNORMAL HIGH (ref 70–99)

## 2013-03-18 LAB — GLUCOSE, CAPILLARY: Glucose-Capillary: 196 mg/dL — ABNORMAL HIGH (ref 70–99)

## 2013-03-18 LAB — CBC
HCT: 27.3 % — ABNORMAL LOW (ref 36.0–46.0)
Hemoglobin: 9 g/dL — ABNORMAL LOW (ref 12.0–15.0)
MCH: 29.5 pg (ref 26.0–34.0)
MCV: 89.5 fL (ref 78.0–100.0)
RBC: 3.05 MIL/uL — ABNORMAL LOW (ref 3.87–5.11)
WBC: 8.5 10*3/uL (ref 4.0–10.5)

## 2013-03-18 MED ORDER — PANTOPRAZOLE SODIUM 40 MG PO TBEC: 40.0000 mg | DELAYED_RELEASE_TABLET | Freq: Two times a day (BID) | ORAL | Status: DC

## 2013-03-18 NOTE — Progress Notes (Signed)
Subjective:  Pt had no acute events overnight. Better BP control with home meds. Pt tolerated regular diet. No repeat signs of gross bleeding. Repeat FOBT was negative. No n/v/d/constipation. Feels ready to go home.   Objective: Vital signs in last 24 hours: Filed Vitals:   03/17/13 1557 03/17/13 1816 03/17/13 2059 03/18/13 0533  BP: 167/63 172/62 173/72 182/71  Pulse: 67  69 67  Temp:   98.4 F (36.9 C) 99.6 F (37.6 C)  TempSrc:   Oral Oral  Resp:   18 18  Height:      Weight:      SpO2:   97% 92%   Weight change:   Intake/Output Summary (Last 24 hours) at 03/18/13 0900 Last data filed at 03/18/13 0600  Gross per 24 hour  Intake    800 ml  Output   2000 ml  Net  -1200 ml   Constitutional: She is oriented to person, place, and time. She is not distressed  Head: Normocephalic and atraumatic.  Eyes: EOM are normal.  Neck: Normal range of motion. Neck supple.  Cardiovascular: Regular rhythm and normal heart sounds.No murmur heard. Pulmonary/Chest: Effort normal and breath sounds normal. No respiratory distress. She has no wheezes. She has no rales.   Abdominal: Soft. Bowel sounds are normal. There is no tenderness. There is no rebound and no guarding.  Musculoskeletal: Normal range of motion. She exhibits no edema and no tenderness.  Neurological: She is alert and oriented to person, place, and time.  Skin: Skin is warm and dry. She is not diaphoretic.  Psychiatric: She has a normal mood and affect. Her behavior is normal.   Lab Results: Basic Metabolic Panel:  Recent Labs Lab 03/17/13 0350 03/18/13 0405  NA 140 138  K 4.1 3.5  CL 101 99  CO2 24 27  GLUCOSE 241* 189*  BUN 86* 80*  CREATININE 3.36* 3.24*  CALCIUM 8.3* 7.9*   CBC:  Recent Labs Lab 03/15/13 1530  03/17/13 2032 03/18/13 0405  WBC 7.4  < > 9.3 8.5  NEUTROABS 6.1  --   --   --   HGB 6.6*  < > 9.2* 9.0*  HCT 19.5*  < > 27.2* 27.3*  MCV 91.1  < > 89.8 89.5  PLT 147*  < > 155 157  < > =  values in this interval not displayed.  CBG:  Recent Labs Lab 03/16/13 2007 03/17/13 0725 03/17/13 1140 03/17/13 1710 03/17/13 2102 03/18/13 0756  GLUCAP 161* 257* 394* 216* 185* 196*   Micro Results: Recent Results (from the past 240 hour(s))  CULTURE, BLOOD (ROUTINE X 2)     Status: None   Collection Time    03/15/13  3:55 PM      Result Value Range Status   Specimen Description BLOOD HAND RIGHT   Final   Special Requests BOTTLES DRAWN AEROBIC ONLY 10CC   Final   Culture  Setup Time     Final   Value: 03/16/2013 00:28     Performed at [facility]   Culture     Final   Value:        BLOOD CULTURE RECEIVED NO GROWTH TO DATE CULTURE WILL BE HELD FOR 5 DAYS BEFORE ISSUING A FINAL NEGATIVE REPORT     Performed at [facility]   Report Status PENDING   Incomplete  CULTURE, BLOOD (ROUTINE X 2)     Status: None   Collection Time    03/15/13  4:00 PM  Result Value Range Status   Specimen Description BLOOD HAND RIGHT   Final   Special Requests BOTTLES DRAWN AEROBIC ONLY 5CC   Final   Culture  Setup Time     Final   Value: 03/16/2013 00:28     Performed at [facility]   Culture     Final   Value:        BLOOD CULTURE RECEIVED NO GROWTH TO DATE CULTURE WILL BE HELD FOR 5 DAYS BEFORE ISSUING A FINAL NEGATIVE REPORT     Performed at [facility]   Report Status PENDING   Incomplete  MRSA PCR SCREENING     Status: None   Collection Time    03/15/13  9:31 PM      Result Value Range Status   MRSA by PCR NEGATIVE  NEGATIVE Final   Comment:            The GeneXpert MRSA Assay (FDA     approved for NASAL specimens     only), is one component of a     comprehensive MRSA colonization     surveillance program. It is not     intended to diagnose MRSA     infection nor to guide or     monitor treatment for     MRSA infections.   Studies/Results: No results found. Medications: I have reviewed the patient's current medications. Scheduled  Meds: . amLODipine  10 mg Oral Daily  . cloNIDine  0.1 mg Oral Daily  . furosemide  160 mg Oral BID  . hydrALAZINE  100 mg Oral TID  . insulin aspart  0-9 Units Subcutaneous TID WC  . isosorbide mononitrate  120 mg Oral Daily  . pantoprazole  40 mg Oral BID   Continuous Infusions:  PRN Meds:.acetaminophen, ondansetron (ZOFRAN) IV, ondansetron Assessment/Plan: Principal Problem:   GI bleed Active Problems:   Anemia, chronic disease   DM (diabetes mellitus)   Chronic kidney disease (CKD), stage IV (severe)   OSA on CPAP  Assessment: 57 year old woman with pmh of insulin dependent T2DM, CKD Stage IV, CHF w/pEF, and OSA on CPAP who presents with lethargy, CP, and epigastric pain that began less than 24 hours ago and found to be hypothermic with Hg 6.6, positive FOBT without active bleeding.   Plan   Symptomatic anemia 2/2 presumed slow leaking UGIB 2/2 small enteric ulcers- Resolved She is hemodynamically stable with no signs of active bleeding,inital Hgb 6.6 pt received 2 u PRBCs and trend Hg>8 --> Hg 9.5 --> 9.7--> 9.4 . Initial FOBT was positive and on recheck was negative.  -Appreciate GI consult - repeat EGD not warranted and will f/u outpt H/H -Per GI to continue treatment for PUD with protonix BID. If Hg drop at follow-up appt with + FOBT may need capsule endoscopy.   Epigastric Pain -resolved. Most likely due to gastric antral ulcer bleeding considering symptomatic anemia and positive FOBT on admission.  -Advance diet as tolerated  -Zofran PRN nausea  -PO protonix 40 mg BID    AKI on CKD stage IV - improving. Cr 3.5 to 3.36 (baseline 2.5) Most likely due to prerenal azotemia (decreased renal perfusion) vs ATN (ischmemia due to hypotension) in setting of possible GI blood loss and diuretic use. Pt had recent AV fistula placed on 10/26 in anticipation of HD and received 3 HD sessions during last admission. Baseline Cr 2.5. No need for HD.  -Continue to monitor BMP  Chronic  diastolic HF - She  has history of RHF/anasarca with pulmonary HTN 01/2012. She does not appear volume overloaded, although pt had recent increase in lasix to 160mg  for the past 2 days per a reported 3-10lb weight gain per daughter. Last echo 10/14 EF 65% with LVH and bilaterally dilated atrium and diastolic filling impairment. CXR with some vascular congestion and slightly elevated pro-bnp 8000 from 7000s.  -Continue home amlodipine, lasix, clonidine, imdur, hydralazine    Insulin-dependent Type II DM - Pt with last HgbA1c 7.7 on 10/14. Glucose on admission was 113 however per EMS on arrival glucose in 60s with no anion gap. Pt on 20 U daily Lantus and sliding scale at home.  -restart home insulin -Glucose monitoring x 4 daily   Obstructive Sleep Apnea - Patient with history of OSA on home CPAP  -Cont CPAP    Dispo: Disposition is deferred at this time, awaiting improvement of current medical problems.  Anticipated discharge in approximately 1-2 day(s).   The patient does have a current PCP Barbie Banner, MD) and does need an [facility] hospital follow-up appointment after discharge.  The patient does have transportation limitations that hinder transportation to clinic appointments.  .Services Needed at time of discharge: Y = Yes, Blank = No PT:   OT:   RN:   Equipment:   Other:     LOS: 3 days   Christen Bame, MD 03/18/2013, 9:00 AM

## 2013-03-18 NOTE — Discharge Summary (Signed)
Name: Madeline [name] MRN: [id number] DOB: 1956/02/05 57 y.o. PCP: Madeline Banner, MD  Date of Admission: 03/15/2013  1:09 PM Date of Discharge: 03/18/2013 Attending Physician: Madeline Spain, MD  Discharge Diagnosis: 1. Acute GI bleed Principal Problem:   GI bleed Active Problems:   Anemia, chronic disease   DM (diabetes mellitus)   Chronic kidney disease (CKD), stage IV (severe)   OSA on CPAP  Discharge Medications:   Medication List    STOP taking these medications       omeprazole 20 MG capsule  Commonly known as:  PRILOSEC      TAKE these medications       albuterol 108 (90 BASE) MCG/ACT inhaler  Commonly known as:  PROVENTIL HFA;VENTOLIN HFA  Inhale 2 puffs into the lungs every 6 (six) hours as needed for wheezing or shortness of breath.     amLODipine 10 MG tablet  Commonly known as:  NORVASC  Take 10 mg by mouth daily.     aspirin EC 81 MG tablet  Take 81 mg by mouth daily.     carvedilol 40 MG 24 hr capsule  Commonly known as:  COREG CR  Take 40 mg by mouth daily.     cloNIDine 0.1 MG tablet  Commonly known as:  CATAPRES  Take 1 tablet (0.1 mg total) by mouth daily.     ferrous sulfate 325 (65 FE) MG tablet  Take 325 mg by mouth daily with breakfast.     furosemide 40 MG tablet  Commonly known as:  LASIX  Take 160 mg by mouth 2 (two) times daily.     gabapentin 300 MG capsule  Commonly known as:  NEURONTIN  Take 300 mg by mouth at bedtime.     hydrALAZINE 100 MG tablet  Commonly known as:  APRESOLINE  Take 100 mg by mouth 3 (three) times daily.     insulin glargine 100 UNIT/ML injection  Commonly known as:  LANTUS  Inject 10 Units into the skin at bedtime.     insulin lispro 100 UNIT/ML injection  Commonly known as:  HUMALOG  Inject 3-10 Units into the skin 3 (three) times daily before meals. Sliding scale.  CBG 200: 3 UNITS, 250 5 UNITS, 300 7 UNITS, 350 9-10 UNITS     isosorbide mononitrate 120 MG 24 hr tablet  Commonly known as:   IMDUR  Take 120 mg by mouth daily.     metolazone 5 MG tablet  Commonly known as:  ZAROXOLYN  Take 5 mg by mouth 2 (two) times daily.     nitroGLYCERIN 0.4 MG SL tablet  Commonly known as:  NITROSTAT  Place 0.4 mg under the tongue every 5 (five) minutes as needed for chest pain.     pantoprazole 40 MG tablet  Commonly known as:  PROTONIX  Take 1 tablet (40 mg total) by mouth 2 (two) times daily.     polyethylene glycol packet  Commonly known as:  MIRALAX / GLYCOLAX  Take 17 g by mouth daily as needed (for constipation).     traMADol 50 MG tablet  Commonly known as:  ULTRAM  Take 50 mg by mouth 3 (three) times daily as needed for pain.        Disposition and follow-up:   [name] was discharged from Madeline [name] in Stable condition.  At the hospital follow up visit please address:  1.  Trend CBC and consider outpt GI for capsule study  2.  Labs / imaging needed at time of follow-up: CBC  3.  Pending labs/ test needing follow-up: none  Follow-up Appointments:     Follow-up Information   Follow up with Madeline Hoit, MD On 03/21/2013. (8:40 AM)    Specialty:  Family Medicine   Contact information:   [address] [place] [address] [phone]       Discharge Instructions: Discharge Orders   Future Appointments Provider Department Dept Phone   03/21/2013 8:40 AM [name] [phone]   Future Orders Complete By Expires   Increase activity slowly  As directed       Consultations: Treatment Team:  Madeline Folk, MD Madeline Friar, MD  Procedures Performed:  Ct Head Wo Contrast  03/15/2013   CLINICAL DATA:  Altered mental status, hypoglycemic the  EXAM: CT HEAD WITHOUT CONTRAST  TECHNIQUE: Contiguous axial images were obtained from the base of the skull through the vertex without intravenous contrast.  COMPARISON:  None.  FINDINGS: No acute intracranial  hemorrhage or large vessel territory infarct is identified. Gray-white matter differentiation is maintained. No mass or midline shift. CSF containing spaces are within normal limits without evidence of hydrocephalus. No extra-axial fluid collection.  Calvarium is intact.  Orbital soft tissues are within normal limits.  Paranasal sinuses and mastoid air cells are clear.  IMPRESSION: Normal head CT with no acute intracranial process.   Electronically Signed   By: Rise Mu M.D.   On: 03/15/2013 19:24   US Abdomen Complete  02/28/2013   CLINICAL DATA:  Acute kidney injury. Hypoalbuminemia and anasarca with ascites.  EXAM: ULTRASOUND ABDOMEN COMPLETE  COMPARISON:  02/15/2013  FINDINGS: Gallbladder  No gallstones or wall thickening visualized. No sonographic Murphy sign noted.  Common bile duct  Diameter: 8.6 mm. No duct stone is seen.  Liver  No focal lesion identified. Within normal limits in parenchymal echogenicity.  IVC  No abnormality visualized.  Pancreas  Visualized portion unremarkable.  Spleen  Spleen is borderline enlarged with a volume of 383 mL. No splenic mass or focal lesion.  Right Kidney  Length: 9.6 cm. Increased renal parenchymal echogenicity. No mass or hydronephrosis.  Left Kidney  Length: 11.7 cm. Increased renal parenchymal echogenicity. No mass or hydronephrosis.  Abdominal aorta  No aneurysm visualized.  Small amount of ascites is seen adjacent to the liver and spleen and in the perinephric spaces.  IMPRESSION: 1. Echogenic kidneys consistent with medical renal disease. No hydronephrosis. 2. Borderline splenomegaly of unclear etiology. 3. Dilation of the common bile duct to 8.6 mm. No duct stone is seen. There is no pancreatic mass. This may be chronic. 4. Small amount of ascites. 5. No other abnormalities. No acute findings.   Electronically Signed   By: Amie Portland M.D.   On: 02/28/2013 12:12   Dg Chest Port 1 View  03/15/2013   CLINICAL DATA:  Chest pain  EXAM: PORTABLE  CHEST - 1 VIEW  COMPARISON:  03/03/2013  FINDINGS: Cardiac enlargement stable. Vascular calcification of the aortic arch stable. Dual lumen right-sided central line unchanged.  There is vascular congestion. There is mild hazy density overlying the left lung, possibly due to patient rotation. No focal consolidation or definite evidence of pulmonary edema.  IMPRESSION: Vascular congestion.  No focal consolidation.   Electronically Signed   By: Esperanza Heir M.D.   On: 03/15/2013 14:25   Dg Chest Port 1 View  03/03/2013   CLINICAL DATA:  End-stage renal disease.  Catheter placement.  EXAM: PORTABLE CHEST - 1 VIEW  COMPARISON:  02/26/2013  FINDINGS: Stable enlarged cardiac silhouette. Interval placement of a right-sided dialysis catheter with split tips in the right atrium. No pneumothorax. Mild pulmonary edema is present.  IMPRESSION: Placement of a right dialysis catheter with tips in the right atrium. No pneumothorax.  Cardiomegaly and mild pulmonary edema   Electronically Signed   By: Genevive Bi M.D.   On: 03/03/2013 12:12   Dg Chest Port 1 View  02/26/2013   CLINICAL DATA:  Short of breath. History of congestive heart failure.  EXAM: PORTABLE CHEST - 1 VIEW  COMPARISON:  02/14/2013.  FINDINGS: Cardiac silhouette is mildly enlarged. The mediastinum is normal in contour.  The lungs are clear. Irregular interstitial thickening seen previously has resolved. No pleural effusion or pneumothorax.  The bony thorax is intact.  IMPRESSION: Presumed pulmonary edema seen previously has resolved. There is now no acute cardiopulmonary disease.   Electronically Signed   By: Amie Portland M.D.   On: 02/26/2013 08:10   Dg Fluoro Guide Cv Line-no Report  03/03/2013   CLINICAL DATA: dialysis cath   FLOURO GUIDE CV LINE  Fluoroscopy was utilized by the requesting physician.  No radiographic  interpretation.     Admission HPI: Madeline [name] is a 57 year old woman with pmh of insulin dependent T2DM, CKD Stage IV,  CHF w/pEF, and OSA on CPAP who presents with lethargy, CP, and epigastric pain that began less than 24 hours ago. Per daughter patient was feeling her normal self until the daughter left to go to work at [employer]. Patient then had lunch and was found by her granddaughter with lethargy, weakness, and slurred speech while sitting on the bedside commode. Upon EMS arrival patient's glucose was 60 and she was cool and slightly diaphoretic. She was given IV dextrose with improvement of glucose to 113 however no improvement of mental status. She reports epigastric pain, chest pain, dyspnea, lightheadedness, constipation, and vomiting. Per daughter no hematemesis, hematochezia, or melena. She is on iron pills with black stools.  Patient was recently hospitalized form 10/17-10/26 for acute renal failure requiring HD. She had acute blood loss from catheter site with blood transfusion of 3PRBs with improvement of Hg to 8.2 on discharge. EGD was done at that time that revealed multiple focal gastric antral ulcers. Per daughter colonoscopy in April showed polys that were removed. She is on ASA daily with no reports of NSAID use.  In the ED patient was hemodynamically stable with no active bleeding and found to be hypothermic (33.7 C).    Hospital Course by problem list:  Symptomatic anemia 2/2 presumed slow leaking UGIB 2/2 small enteric ulcers- Most likely due to upper GI bleed given previous EGD during 03/06/13 admission. Patient with Hg on admission of 6.6 with baseline Hg 8. She was hemodynamically stable with no signs of active bleeding, however FOBT positive. No reports of melena, hematochezia, or hematemesis. She recently started on iron and ASA. No NSAID use. EGD showed scattered gastric ulceration and no colonoscopy was done at that time. Last colonoscopy not in epic per daughter in April with polyps that were removed. GI consulted and didn't repeat imaging or procedures and trended Hgb after 2 units PRBCs. Hg>8 -->  Hg 9.5 --> 9.7--> 9.4 . Initial FOBT was positive and on recheck was negative before discharge. GI to continue treatment for PUD with protonix BID. If Hg drop at follow-up appt with + FOBT may  need capsule endoscopy.   AMS - Pt was eating and then sudden weakness no other deficits. No focal neuro deficits.Per daughter patient with stroke in May with residual left sided weakness. SIRS with hypothermia and tachypnea but no frank leukocytosis although neutrophilia. Lactic acid within normal limits. CXR without sign of cardiopulmonary disease. Initally hypoglycemic to 60s that responded to D50. Initial trop negative and no EKG changes. CT head --> no acute intracranial process and other workup was negative with blood cultures x 2 NGTD, UA --> proteinuria, neg troponin, unchanged EKGs, and down trended BUN upon d/c.   AKI on CKD stage IV - Most likely due to prerenal azotemia (decreased renal perfusion) vs ATN (ischmemia due to hypotension) in setting of possible GI blood loss and diuretic use. Pt had recent AV fistula placed on 10/26 in anticipation of HD and received 3 HD sessions during last admission. Baseline Cr 2.5 and 3.5 and elevated BUN.   Chronic diastolic HF - She has history of RHF/anasarca with pulmonary HTN 01/2012. She does not appear volume overloaded, although pt had recent increase in lasix to 160mg  for the past 2 days per a reported 3-10lb weight gain per daughter. Last echo 10/14 EF 65% with LVH and bilaterally dilated atrium and diastolic filling impairment. CXR with some vascular congestion and slightly elevated pro-bnp 8000 from 7000s. Pt didn't need increased diuresis or HD during admission.   Insulin-dependent Type II DM - Pt with last HgbA1c 7.7 on 10/14. Glucose on admission was 113 however per EMS on arrival glucose in 60s with no anion gap. Pt on 20 U daily Lantus and sliding scale at home which was continued once pt tolerated diet with good glycemic control.   Obstructive Sleep  Apnea - Patient with history of OSA on home CPAP that was continued in hospital.     Discharge Vitals:   BP 182/71  Pulse 67  Temp(Src) 99.6 F (37.6 C) (Oral)  Resp 18  Ht 4\' 9"  (1.448 m)  Wt 160 lb 1.6 oz (72.621 kg)  BMI 34.64 kg/m2  SpO2 92% Constitutional: She is oriented to person, place, and time. She is not distressed  Head: Normocephalic and atraumatic.  Eyes: EOM are normal.  Neck: Normal range of motion. Neck supple.  Cardiovascular: Regular rhythm and normal heart sounds.No murmur heard. Pulmonary/Chest: Effort normal and breath sounds normal. No respiratory distress. She has no wheezes. She has no rales.  Abdominal: Soft. Bowel sounds are normal. There is no tenderness. There is no rebound and no guarding.  Musculoskeletal: Normal range of motion. She exhibits no edema and no tenderness.  Neurological: She is alert and oriented to person, place, and time.  Skin: Skin is warm and dry. She is not diaphoretic.  Psychiatric: She has a normal mood and affect. Her behavior is normal.   Discharge Labs:  Results for orders placed during the hospital encounter of 03/15/13 (from the past 24 hour(s))  GLUCOSE, CAPILLARY     Status: Abnormal   Collection Time    03/17/13 11:40 AM      Result Value Range   Glucose-Capillary 394 (*) 70 - 99 mg/dL   Comment 1 Notify RN     Comment 2 Documented in Chart    GLUCOSE, CAPILLARY     Status: Abnormal   Collection Time    03/17/13  5:10 PM      Result Value Range   Glucose-Capillary 216 (*) 70 - 99 mg/dL  CBC     Status:  Abnormal   Collection Time    03/17/13  8:32 PM      Result Value Range   WBC 9.3  4.0 - 10.5 K/uL   RBC 3.03 (*) 3.87 - 5.11 MIL/uL   Hemoglobin 9.2 (*) 12.0 - 15.0 g/dL   HCT 95.6 (*) 21.3 - 08.6 %   MCV 89.8  78.0 - 100.0 fL   MCH 30.4  26.0 - 34.0 pg   MCHC 33.8  30.0 - 36.0 g/dL   RDW 57.8 (*) 46.9 - 62.9 %   Platelets 155  150 - 400 K/uL  GLUCOSE, CAPILLARY     Status: Abnormal   Collection Time     03/17/13  9:02 PM      Result Value Range   Glucose-Capillary 185 (*) 70 - 99 mg/dL   Comment 1 Notify RN    CBC     Status: Abnormal   Collection Time    03/18/13  4:05 AM      Result Value Range   WBC 8.5  4.0 - 10.5 K/uL   RBC 3.05 (*) 3.87 - 5.11 MIL/uL   Hemoglobin 9.0 (*) 12.0 - 15.0 g/dL   HCT 52.8 (*) 41.3 - 24.4 %   MCV 89.5  78.0 - 100.0 fL   MCH 29.5  26.0 - 34.0 pg   MCHC 33.0  30.0 - 36.0 g/dL   RDW 01.0 (*) 27.2 - 53.6 %   Platelets 157  150 - 400 K/uL  BASIC METABOLIC PANEL     Status: Abnormal   Collection Time    03/18/13  4:05 AM      Result Value Range   Sodium 138  135 - 145 mEq/L   Potassium 3.5  3.5 - 5.1 mEq/L   Chloride 99  96 - 112 mEq/L   CO2 27  19 - 32 mEq/L   Glucose, Bld 189 (*) 70 - 99 mg/dL   BUN 80 (*) 6 - 23 mg/dL   Creatinine, Ser 6.44 (*) 0.50 - 1.10 mg/dL   Calcium 7.9 (*) 8.4 - 10.5 mg/dL   GFR calc non Af Amer 15 (*) >90 mL/min   GFR calc Af Amer 17 (*) >90 mL/min  OCCULT BLOOD X 1 CARD TO LAB, STOOL     Status: None   Collection Time    03/18/13  5:54 AM      Result Value Range   Fecal Occult Bld NEGATIVE  NEGATIVE  GLUCOSE, CAPILLARY     Status: Abnormal   Collection Time    03/18/13  7:56 AM      Result Value Range   Glucose-Capillary 196 (*) 70 - 99 mg/dL   Comment 1 Notify RN      Signed: Christen Bame, MD 03/18/2013, 9:07 AM   Time Spent on Discharge: 35 minutes Services Ordered on Discharge: none Equipment Ordered on Discharge: none

## 2013-03-19 LAB — TYPE AND SCREEN
ABO/RH(D): O POS
Antibody Screen: POSITIVE
DAT, IgG: NEGATIVE
Unit division: 0
Unit division: 0

## 2013-03-21 ENCOUNTER — Ambulatory Visit (HOSPITAL_COMMUNITY)
Admission: RE | Admit: 2013-03-21 | Discharge: 2013-03-21 | Disposition: A | Discharge status: Home / Self Care | Payer: Medicaid - Out of State | Source: Ambulatory Visit | Attending: Internal Medicine | Admitting: Internal Medicine

## 2013-03-21 ENCOUNTER — Encounter (HOSPITAL_COMMUNITY): Payer: Self-pay

## 2013-03-21 VITALS — BP 158/60 | HR 62 | Ht <= 58 in | Wt 149.8 lb

## 2013-03-21 DIAGNOSIS — D649 Anemia, unspecified: Secondary | ICD-10-CM | POA: Insufficient documentation

## 2013-03-21 DIAGNOSIS — I10 Essential (primary) hypertension: Secondary | ICD-10-CM

## 2013-03-21 DIAGNOSIS — N184 Chronic kidney disease, stage 4 (severe): Secondary | ICD-10-CM

## 2013-03-21 DIAGNOSIS — I129 Hypertensive chronic kidney disease with stage 1 through stage 4 chronic kidney disease, or unspecified chronic kidney disease: Secondary | ICD-10-CM | POA: Insufficient documentation

## 2013-03-21 DIAGNOSIS — J449 Chronic obstructive pulmonary disease, unspecified: Secondary | ICD-10-CM | POA: Insufficient documentation

## 2013-03-21 DIAGNOSIS — N189 Chronic kidney disease, unspecified: Secondary | ICD-10-CM | POA: Insufficient documentation

## 2013-03-21 DIAGNOSIS — I509 Heart failure, unspecified: Secondary | ICD-10-CM | POA: Insufficient documentation

## 2013-03-21 DIAGNOSIS — I252 Old myocardial infarction: Secondary | ICD-10-CM | POA: Insufficient documentation

## 2013-03-21 DIAGNOSIS — E119 Type 2 diabetes mellitus without complications: Secondary | ICD-10-CM | POA: Insufficient documentation

## 2013-03-21 DIAGNOSIS — I2510 Atherosclerotic heart disease of native coronary artery without angina pectoris: Secondary | ICD-10-CM | POA: Insufficient documentation

## 2013-03-21 DIAGNOSIS — I5032 Chronic diastolic (congestive) heart failure: Secondary | ICD-10-CM | POA: Insufficient documentation

## 2013-03-21 DIAGNOSIS — J4489 Other specified chronic obstructive pulmonary disease: Secondary | ICD-10-CM | POA: Insufficient documentation

## 2013-03-21 DIAGNOSIS — G4733 Obstructive sleep apnea (adult) (pediatric): Secondary | ICD-10-CM | POA: Insufficient documentation

## 2013-03-21 MED ORDER — CLONIDINE HCL 0.1 MG PO TABS: 0.1000 mg | ORAL_TABLET | Freq: Two times a day (BID) | ORAL | Status: DC

## 2013-03-21 NOTE — Progress Notes (Signed)
Patient ID: [name], female   DOB: [birth year], 57 y.o.   MRN: [id number]  Weight Range  150-159  Baseline proBNP     PCP: Dr Andrey Campanile Nephrologist: Dr Arrie Aran Pulmonologist: None  HPI: Madeline [name] is a 57 y/o F with history of HFpEF (RHF/anasarca with pulmonary HTN), CKD stage 3, suspected CAD, DM, chronic anemia, OSA (not recently using CPAP) who is originally from [facility] who moved to [facility] in May 2014. Her daughter is a Film/video editor at [employer]. She also has a history of  01/2012 with NSTEMI, pulm HTN and SOB. She underwent CTA which was negative for PE but resulted in ARF requiring temporary dialysis (1 session). Invasive ischemic evaluation was not pursued. She reports remote heart cath in 1980s but none recently including RHC. She does have h/o prior normal Lexiscan nuc in 12/2011 (before that hospitalization). She has had several admissions to the hospital for CHF, CP rule-out, and one in 09/2012 for metabolic encephalopathy with question of aseptic meningitis, CVA (with reportedly negative MRI), oliguric ATN/ARF, and CHF.  Admitted to [facility] 02/14/13 through 03/05/13 with acute respiratory failure due to volume overload complicated by CKD. ECHO 10/8/14EF 65% RV dilated.  Lasix drip used but ultimately required HD. AVF placed while hospitalized. Had acute blood loss and received 3UPRBCs.  D/C weight 150 pounds.    RHC 02/28/13 RA = 14  RV = 75/8/18  PA = 69/16 (43)  PCW = 29 with v-waves to 50 (check on in R and L PAs)  Fick cardiac output/index = 5.4/3.1  PVR = 2.8  FA sat = 97%  PA sat = 56%, 57%   Readmitted 03/16/13 through 03/18/13 with lethargy and acute blood loss anemia. GI consulted small enteric ulcers noted Received 2UPRBCs. Hemoglobin 8> 9.4. Discharged on lasix 160 mg twice a day and Metolazone 5 mg twice a day. Discharge weight 160 pounds  RHC 02/28/13 RA = 14  RV = 75/8/18  PA = 69/16 (43)  PCW = 29 with v-waves to 50 (check on in R and L PAs)  Fick cardiac  output/index = 5.4/3.1  PVR = 2.8  FA sat = 97%  PA sat = 56%, 57%   She returns for post hospital follow up. Since discharged diuretics have adjusted by Dr Arrie Aran with an increase to 160 mg lasix bid and metolazone 5 mg twice a day. She went back to 80 mg of lasix bid and continued on Metolazone 5 mg bid on 03/18/13. Weight at home trending down from 159 to 146 pounds. Complaining of fatigue. Mild dyspnea with exertion. + Orthopnea sleeps on 5 pillows. CPAP has not been provided. Followed by [facility]. Daughter prepares pill box.      ROS: All systems negative except as listed in HPI, PMH and Problem List.  Past Medical History  Diagnosis Date  . CHF (congestive heart failure)     a. HFpEF with RHF/anasarca/pHTN.  . COPD (chronic obstructive pulmonary disease)   . Coronary artery disease     a. Per [place] records: NSTEMI 01/2012, tx medically given ARF but suspected CAD. b. Stress test 12/16/11 reported w/o ischemia.  . Hypertension   . Diabetes mellitus   . OSA (obstructive sleep apnea)     a. Pt reported used to use CPAP in [place], but "ran out" when came to [facility].  Marland Kitchen CKD (chronic kidney disease), stage III     a. Per [place] records: h/o ARF after CTA that ruled out PE.  Marland Kitchen Pulmonary  hypertension     a. RHC 02/28/13: mod pulm HTN with normal PVR suggestive of predominantly pulmonary venous HTN.  . Right heart failure   . Anasarca     a. Per [place] records - due to pulm HTN with R HF.   Marland Kitchen Aseptic meningitis     a. 09/2012: adm in [place] for metabolic encephalopathy, oliguric tubular necrosis, anemia, HTN, possible CVA, HHNKA.  Marland Kitchen CVA (cerebral infarction)     a. Per [place] records, "possible CVA" 09/2012 but MRI reportedly negative.  . Abnormal Doppler ultrasound of carotid artery     a. Per [place] records: <50% LICA.    Current Outpatient Prescriptions  Medication Sig Dispense Refill  . albuterol (PROVENTIL HFA;VENTOLIN HFA) 108 (90 BASE) MCG/ACT inhaler Inhale 2 puffs into the lungs every 6 (six) hours as needed  for wheezing or shortness of breath.       Marland Kitchen amLODipine (NORVASC) 10 MG tablet Take 10 mg by mouth daily.      Marland Kitchen aspirin EC 81 MG tablet Take 81 mg by mouth daily.      . carvedilol (COREG CR) 40 MG 24 hr capsule Take 40 mg by mouth 2 (two) times daily.       . cloNIDine (CATAPRES) 0.1 MG tablet Take 1 tablet (0.1 mg total) by mouth daily.  30 tablet  2  . ferrous sulfate 325 (65 FE) MG tablet Take 325 mg by mouth daily with breakfast.      . furosemide (LASIX) 40 MG tablet Take 80 mg by mouth 2 (two) times daily.       Marland Kitchen gabapentin (NEURONTIN) 300 MG capsule Take 300 mg by mouth at bedtime.      . hydrALAZINE (APRESOLINE) 100 MG tablet Take 100 mg by mouth 3 (three) times daily.      . insulin glargine (LANTUS) 100 UNIT/ML injection Inject 10 Units into the skin at bedtime.      . insulin lispro (HUMALOG) 100 UNIT/ML injection Inject 3-10 Units into the skin 3 (three) times daily before meals. Sliding scale.  CBG 200: 3 UNITS, 250 5 UNITS, 300 7 UNITS, 350 9-10 UNITS      . isosorbide mononitrate (IMDUR) 120 MG 24 hr tablet Take 120 mg by mouth daily.      . metolazone (ZAROXOLYN) 5 MG tablet Take 5 mg by mouth 2 (two) times daily.      . nitroGLYCERIN (NITROSTAT) 0.4 MG SL tablet Place 0.4 mg under the tongue every 5 (five) minutes as needed for chest pain.       . pantoprazole (PROTONIX) 40 MG tablet Take 1 tablet (40 mg total) by mouth 2 (two) times daily.  60 tablet  12  . polyethylene glycol (MIRALAX / GLYCOLAX) packet Take 17 g by mouth daily as needed (for constipation).  14 each  0  . traMADol (ULTRAM) 50 MG tablet Take 50 mg by mouth 3 (three) times daily as needed for pain.       No current facility-administered medications for this encounter.     PHYSICAL EXAM: Filed Vitals:   03/21/13 0848  BP: 158/60  Pulse: 62  Height: 4\' 9"  (1.448 m)  Weight: 149 lb 12.8 oz (67.949 kg)  SpO2: 100%    General:  Fatigued appearing. No resp difficulty Sitting in wheelchair.  HEENT:  normal Neck: supple. JVP ~8-9. Carotids 2+ bilaterally; no bruits. No lymphadenopathy or thryomegaly appreciated. Cor: PMI normal. Regular rate & rhythm. No rubs, gallops or murmurs.R upper  chest HD cath Lungs: clear Abdomen: soft, nontender, nondistended. No hepatosplenomegaly. No bruits or masses. Good bowel sounds. Extremities: no cyanosis, clubbing, rash, edema LUE AVG Neuro: alert & orientedx3, cranial nerves grossly intact. Moves all 4 extremities w/o difficulty. Affect pleasant.      ASSESSMENT & PLAN: 1. Chronic Diastolic Heart Failure ECHO 02/2013 EF 65%  Volume status ok. Baseline weight at home  150-155  pounds. Continue lasix 80 mg bid and metolazone 5 mg bid.  Reinforced daily weights, low salt food choices, and limiting fluid intake to , 2 liters per day.   2. HTN - Elevated. Continue amlodipine 10 mg daily, carvedilol CR 40 mg twice a day, hydralazine 100 mg tid, and Imdur 120 mg bid. Increase clonidine to 0.1 mg twice a day  3. CKD- Creatinine baseline 3.2. Has AVG . Followed by Dr Arrie Aran . Follow up 03/24/13  4.  Anemia- On Iron.   4. OSA-AHC to provide with CPAP.   5. DMII- hemoglobin A1C 7.7. PCP managing.    Follow up 3 months   Avamarie Crossley NP-C 9:06 AM

## 2013-03-21 NOTE — Patient Instructions (Signed)
Follow up in 3 months  Increase clonidine to 0.1 mg twice a day  Do the following things EVERYDAY: 1) Weigh yourself in the morning before breakfast. Write it down and keep it in a log. 2) Take your medicines as prescribed 3) Eat low salt foods-Limit salt (sodium) to 2000 mg per day.  4) Stay as active as you can everyday 5) Limit all fluids for the day to less than 2 liters

## 2013-03-22 LAB — CULTURE, BLOOD (ROUTINE X 2): Culture: NO GROWTH

## 2013-03-31 ENCOUNTER — Other Ambulatory Visit (HOSPITAL_COMMUNITY): Payer: Self-pay | Admitting: *Deleted

## 2013-04-03 ENCOUNTER — Encounter (HOSPITAL_COMMUNITY)
Admission: RE | Admit: 2013-04-03 | Discharge: 2013-04-03 | Disposition: A | Discharge status: Home / Self Care | Payer: Medicaid Other | Source: Ambulatory Visit | Attending: Nephrology | Admitting: Nephrology

## 2013-04-03 VITALS — BP 171/70 | HR 61 | Temp 98.1°F | Resp 18

## 2013-04-03 DIAGNOSIS — N189 Chronic kidney disease, unspecified: Secondary | ICD-10-CM

## 2013-04-03 DIAGNOSIS — D638 Anemia in other chronic diseases classified elsewhere: Secondary | ICD-10-CM | POA: Insufficient documentation

## 2013-04-03 DIAGNOSIS — N184 Chronic kidney disease, stage 4 (severe): Secondary | ICD-10-CM | POA: Insufficient documentation

## 2013-04-03 DIAGNOSIS — N179 Acute kidney failure, unspecified: Secondary | ICD-10-CM

## 2013-04-03 LAB — POCT HEMOGLOBIN-HEMACUE: Hemoglobin: 8.8 g/dL — ABNORMAL LOW (ref 12.0–15.0)

## 2013-04-03 MED ORDER — DARBEPOETIN ALFA-POLYSORBATE 40 MCG/0.4ML IJ SOLN
40.0000 ug | INTRAMUSCULAR | Status: DC
  Administered 2013-04-03: 13:00:00 40 ug via SUBCUTANEOUS

## 2013-04-03 MED ORDER — DARBEPOETIN ALFA-POLYSORBATE 40 MCG/0.4ML IJ SOLN
INTRAMUSCULAR | Status: AC
  Filled 2013-04-03: qty 0.4

## 2013-04-10 ENCOUNTER — Encounter (HOSPITAL_COMMUNITY): Payer: Medicaid Other

## 2013-04-18 ENCOUNTER — Institutional Professional Consult (permissible substitution): Payer: Medicaid - Out of State | Admitting: Cardiology

## 2013-05-01 ENCOUNTER — Encounter (HOSPITAL_COMMUNITY)
Admission: RE | Admit: 2013-05-01 | Discharge: 2013-05-01 | Disposition: A | Discharge status: Home / Self Care | Payer: Medicaid Other | Source: Ambulatory Visit | Attending: Nephrology | Admitting: Nephrology

## 2013-05-01 VITALS — BP 171/74 | HR 55 | Temp 98.3°F | Resp 18

## 2013-05-01 DIAGNOSIS — N179 Acute kidney failure, unspecified: Secondary | ICD-10-CM | POA: Insufficient documentation

## 2013-05-01 DIAGNOSIS — N189 Chronic kidney disease, unspecified: Secondary | ICD-10-CM | POA: Insufficient documentation

## 2013-05-01 LAB — IRON AND TIBC
Iron: 72 ug/dL (ref 42–135)
Saturation Ratios: 25 % (ref 20–55)
TIBC: 284 ug/dL (ref 250–470)

## 2013-05-01 LAB — COMPREHENSIVE METABOLIC PANEL
ALT: 43 U/L — ABNORMAL HIGH (ref 0–35)
Albumin: 2.8 g/dL — ABNORMAL LOW (ref 3.5–5.2)
BUN: 112 mg/dL — ABNORMAL HIGH (ref 6–23)
CO2: 22 mEq/L (ref 19–32)
Calcium: 8.6 mg/dL (ref 8.4–10.5)
Creatinine, Ser: 3.97 mg/dL — ABNORMAL HIGH (ref 0.50–1.10)
GFR calc Af Amer: 14 mL/min — ABNORMAL LOW (ref >90)
GFR calc non Af Amer: 12 mL/min — ABNORMAL LOW (ref >90)
Glucose, Bld: 252 mg/dL — ABNORMAL HIGH (ref 70–99)
Potassium: 4.8 mEq/L (ref 3.5–5.1)
Sodium: 134 mEq/L — ABNORMAL LOW (ref 135–145)
Total Protein: 6.7 g/dL (ref 6.0–8.3)

## 2013-05-01 LAB — CBC
MCH: 30.4 pg (ref 26.0–34.0)
MCHC: 33.1 g/dL (ref 30.0–36.0)
MCV: 91.8 fL (ref 78.0–100.0)
Platelets: 139 10*3/uL — ABNORMAL LOW (ref 150–400)
RBC: 2.93 MIL/uL — ABNORMAL LOW (ref 3.87–5.11)

## 2013-05-01 MED ORDER — DARBEPOETIN ALFA-POLYSORBATE 40 MCG/0.4ML IJ SOLN: 40.0000 ug | INTRAMUSCULAR | Status: DC

## 2013-05-01 MED ORDER — DARBEPOETIN ALFA-POLYSORBATE 40 MCG/0.4ML IJ SOLN
INTRAMUSCULAR | Status: AC
  Administered 2013-05-01: 09:00:00 40 ug via SUBCUTANEOUS
  Filled 2013-05-01: qty 0.4

## 2013-05-24 ENCOUNTER — Other Ambulatory Visit: Payer: Self-pay | Admitting: *Deleted

## 2013-05-24 DIAGNOSIS — T82598A Other mechanical complication of other cardiac and vascular devices and implants, initial encounter: Secondary | ICD-10-CM

## 2013-05-29 ENCOUNTER — Encounter (HOSPITAL_COMMUNITY)
Admission: RE | Admit: 2013-05-29 | Discharge: 2013-05-29 | Disposition: A | Discharge status: Home / Self Care | Payer: Medicaid Other | Source: Ambulatory Visit | Attending: Nephrology | Admitting: Nephrology

## 2013-05-29 VITALS — BP 178/80 | HR 64 | Temp 98.8°F | Resp 18

## 2013-05-29 DIAGNOSIS — N189 Chronic kidney disease, unspecified: Secondary | ICD-10-CM | POA: Insufficient documentation

## 2013-05-29 DIAGNOSIS — N179 Acute kidney failure, unspecified: Secondary | ICD-10-CM | POA: Insufficient documentation

## 2013-05-29 LAB — POCT HEMOGLOBIN-HEMACUE: Hemoglobin: 11.4 g/dL — ABNORMAL LOW (ref 12.0–15.0)

## 2013-05-29 MED ORDER — DARBEPOETIN ALFA-POLYSORBATE 40 MCG/0.4ML IJ SOLN
40.0000 ug | INTRAMUSCULAR | Status: DC
  Administered 2013-05-29: 40 ug via SUBCUTANEOUS

## 2013-05-29 MED ORDER — DARBEPOETIN ALFA-POLYSORBATE 40 MCG/0.4ML IJ SOLN
INTRAMUSCULAR | Status: AC
Start: 2013-05-29 — End: 2013-05-29
  Filled 2013-05-29: qty 0.4

## 2013-05-30 ENCOUNTER — Institutional Professional Consult (permissible substitution): Payer: Medicaid - Out of State | Admitting: Pulmonary Disease

## 2013-06-03 ENCOUNTER — Other Ambulatory Visit: Payer: Self-pay

## 2013-06-03 ENCOUNTER — Encounter (HOSPITAL_COMMUNITY): Payer: Self-pay | Admitting: Emergency Medicine

## 2013-06-03 ENCOUNTER — Emergency Department (HOSPITAL_COMMUNITY): Payer: Medicaid Other

## 2013-06-03 ENCOUNTER — Inpatient Hospital Stay (HOSPITAL_COMMUNITY)
Admission: EM | Admit: 2013-06-03 | Discharge: 2013-06-05 | DRG: 384 | Disposition: A | Discharge status: Home / Self Care | Payer: Medicaid Other | Attending: Internal Medicine | Admitting: Internal Medicine

## 2013-06-03 DIAGNOSIS — Z8249 Family history of ischemic heart disease and other diseases of the circulatory system: Secondary | ICD-10-CM

## 2013-06-03 DIAGNOSIS — I252 Old myocardial infarction: Secondary | ICD-10-CM

## 2013-06-03 DIAGNOSIS — N184 Chronic kidney disease, stage 4 (severe): Secondary | ICD-10-CM

## 2013-06-03 DIAGNOSIS — Z7982 Long term (current) use of aspirin: Secondary | ICD-10-CM

## 2013-06-03 DIAGNOSIS — I10 Essential (primary) hypertension: Secondary | ICD-10-CM | POA: Diagnosis present

## 2013-06-03 DIAGNOSIS — Z833 Family history of diabetes mellitus: Secondary | ICD-10-CM

## 2013-06-03 DIAGNOSIS — Z794 Long term (current) use of insulin: Secondary | ICD-10-CM

## 2013-06-03 DIAGNOSIS — Z9989 Dependence on other enabling machines and devices: Secondary | ICD-10-CM

## 2013-06-03 DIAGNOSIS — I129 Hypertensive chronic kidney disease with stage 1 through stage 4 chronic kidney disease, or unspecified chronic kidney disease: Secondary | ICD-10-CM | POA: Diagnosis present

## 2013-06-03 DIAGNOSIS — I2789 Other specified pulmonary heart diseases: Secondary | ICD-10-CM | POA: Diagnosis present

## 2013-06-03 DIAGNOSIS — R1013 Epigastric pain: Secondary | ICD-10-CM

## 2013-06-03 DIAGNOSIS — Z79899 Other long term (current) drug therapy: Secondary | ICD-10-CM

## 2013-06-03 DIAGNOSIS — K279 Peptic ulcer, site unspecified, unspecified as acute or chronic, without hemorrhage or perforation: Principal | ICD-10-CM

## 2013-06-03 DIAGNOSIS — R079 Chest pain, unspecified: Secondary | ICD-10-CM

## 2013-06-03 DIAGNOSIS — I5032 Chronic diastolic (congestive) heart failure: Secondary | ICD-10-CM | POA: Diagnosis present

## 2013-06-03 DIAGNOSIS — Z87891 Personal history of nicotine dependence: Secondary | ICD-10-CM

## 2013-06-03 DIAGNOSIS — I872 Venous insufficiency (chronic) (peripheral): Secondary | ICD-10-CM | POA: Diagnosis present

## 2013-06-03 DIAGNOSIS — N185 Chronic kidney disease, stage 5: Secondary | ICD-10-CM | POA: Diagnosis present

## 2013-06-03 DIAGNOSIS — E119 Type 2 diabetes mellitus without complications: Secondary | ICD-10-CM | POA: Diagnosis present

## 2013-06-03 DIAGNOSIS — G4733 Obstructive sleep apnea (adult) (pediatric): Secondary | ICD-10-CM | POA: Diagnosis present

## 2013-06-03 DIAGNOSIS — Z8673 Personal history of transient ischemic attack (TIA), and cerebral infarction without residual deficits: Secondary | ICD-10-CM

## 2013-06-03 DIAGNOSIS — I509 Heart failure, unspecified: Secondary | ICD-10-CM | POA: Diagnosis present

## 2013-06-03 DIAGNOSIS — J4489 Other specified chronic obstructive pulmonary disease: Secondary | ICD-10-CM | POA: Diagnosis present

## 2013-06-03 DIAGNOSIS — Z9851 Tubal ligation status: Secondary | ICD-10-CM

## 2013-06-03 DIAGNOSIS — Z91199 Patient's noncompliance with other medical treatment and regimen due to unspecified reason: Secondary | ICD-10-CM

## 2013-06-03 DIAGNOSIS — J449 Chronic obstructive pulmonary disease, unspecified: Secondary | ICD-10-CM | POA: Diagnosis present

## 2013-06-03 DIAGNOSIS — R0789 Other chest pain: Secondary | ICD-10-CM | POA: Diagnosis present

## 2013-06-03 DIAGNOSIS — Z9119 Patient's noncompliance with other medical treatment and regimen: Secondary | ICD-10-CM

## 2013-06-03 DIAGNOSIS — I2510 Atherosclerotic heart disease of native coronary artery without angina pectoris: Secondary | ICD-10-CM | POA: Diagnosis present

## 2013-06-03 LAB — CBC
HCT: 38.4 % (ref 36.0–46.0)
Hemoglobin: 13.5 g/dL (ref 12.0–15.0)
MCH: 30.5 pg (ref 26.0–34.0)
MCHC: 35.2 g/dL (ref 30.0–36.0)
MCV: 86.7 fL (ref 78.0–100.0)
Platelets: 242 10*3/uL (ref 150–400)
RBC: 4.43 MIL/uL (ref 3.87–5.11)
RDW: 13 % (ref 11.5–15.5)
WBC: 9.3 10*3/uL (ref 4.0–10.5)

## 2013-06-03 LAB — COMPREHENSIVE METABOLIC PANEL
ALT: 34 U/L (ref 0–35)
AST: 50 U/L — AB (ref 0–37)
Albumin: 3.6 g/dL (ref 3.5–5.2)
Alkaline Phosphatase: 140 U/L — ABNORMAL HIGH (ref 39–117)
BILIRUBIN TOTAL: 0.4 mg/dL (ref 0.3–1.2)
BUN: 62 mg/dL — AB (ref 6–23)
CO2: 18 mEq/L — ABNORMAL LOW (ref 19–32)
Calcium: 10.1 mg/dL (ref 8.4–10.5)
Chloride: 97 mEq/L (ref 96–112)
Creatinine, Ser: 3.06 mg/dL — ABNORMAL HIGH (ref 0.50–1.10)
GFR calc Af Amer: 19 mL/min — ABNORMAL LOW (ref >90)
GFR calc non Af Amer: 16 mL/min — ABNORMAL LOW (ref >90)
Glucose, Bld: 242 mg/dL — ABNORMAL HIGH (ref 70–99)
POTASSIUM: 4.5 meq/L (ref 3.7–5.3)
Sodium: 140 mEq/L (ref 137–147)
Total Protein: 8.1 g/dL (ref 6.0–8.3)

## 2013-06-03 LAB — POCT I-STAT TROPONIN I: TROPONIN I, POC: 0.03 ng/mL (ref 0.00–0.08)

## 2013-06-03 LAB — LIPASE, BLOOD: Lipase: 49 U/L (ref 11–59)

## 2013-06-03 MED ORDER — PANTOPRAZOLE SODIUM 40 MG IV SOLR
40.0000 mg | Freq: Two times a day (BID) | INTRAVENOUS | Status: DC
  Administered 2013-06-04 – 2013-06-05 (×4): 40 mg via INTRAVENOUS
  Filled 2013-06-03 (×5): qty 40

## 2013-06-03 MED ORDER — INSULIN ASPART 100 UNIT/ML ~~LOC~~ SOLN
0.0000 [IU] | Freq: Three times a day (TID) | SUBCUTANEOUS | Status: DC
  Administered 2013-06-04: 7 [IU] via SUBCUTANEOUS
  Administered 2013-06-04: 3 [IU] via SUBCUTANEOUS
  Administered 2013-06-04: 7 [IU] via SUBCUTANEOUS
  Administered 2013-06-05: 9 [IU] via SUBCUTANEOUS

## 2013-06-03 MED ORDER — POLYETHYLENE GLYCOL 3350 17 G PO PACK
17.0000 g | PACK | Freq: Every day | ORAL | Status: DC | PRN
  Filled 2013-06-03: qty 1

## 2013-06-03 MED ORDER — HYDROMORPHONE HCL PF 1 MG/ML IJ SOLN
0.5000 mg | Freq: Once | INTRAMUSCULAR | Status: AC
  Administered 2013-06-03: 0.5 mg via INTRAVENOUS
  Filled 2013-06-03: qty 1

## 2013-06-03 MED ORDER — HEPARIN SODIUM (PORCINE) 5000 UNIT/ML IJ SOLN
5000.0000 [IU] | Freq: Three times a day (TID) | INTRAMUSCULAR | Status: DC
  Administered 2013-06-04 – 2013-06-05 (×4): 5000 [IU] via SUBCUTANEOUS
  Filled 2013-06-03 (×7): qty 1

## 2013-06-03 MED ORDER — SODIUM CHLORIDE 0.9 % IJ SOLN
3.0000 mL | Freq: Two times a day (BID) | INTRAMUSCULAR | Status: DC
  Administered 2013-06-04 – 2013-06-05 (×4): 3 mL via INTRAVENOUS

## 2013-06-03 MED ORDER — AMLODIPINE BESYLATE 10 MG PO TABS
10.0000 mg | ORAL_TABLET | Freq: Once | ORAL | Status: AC
  Administered 2013-06-03: 10 mg via ORAL
  Filled 2013-06-03: qty 1

## 2013-06-03 MED ORDER — HYDROMORPHONE HCL PF 1 MG/ML IJ SOLN
0.5000 mg | Freq: Once | INTRAMUSCULAR | Status: AC
  Administered 2013-06-03: 0.5 mg via INTRAVENOUS

## 2013-06-03 MED ORDER — METOLAZONE 5 MG PO TABS
5.0000 mg | ORAL_TABLET | Freq: Two times a day (BID) | ORAL | Status: DC
  Administered 2013-06-04 – 2013-06-05 (×3): 5 mg via ORAL
  Filled 2013-06-03 (×5): qty 1

## 2013-06-03 MED ORDER — INSULIN GLARGINE 100 UNIT/ML ~~LOC~~ SOLN
10.0000 [IU] | Freq: Every day | SUBCUTANEOUS | Status: DC
  Administered 2013-06-04 (×2): 10 [IU] via SUBCUTANEOUS
  Filled 2013-06-03 (×3): qty 0.1

## 2013-06-03 MED ORDER — HYDROMORPHONE HCL PF 1 MG/ML IJ SOLN
1.0000 mg | Freq: Once | INTRAMUSCULAR | Status: DC
  Filled 2013-06-03: qty 1

## 2013-06-03 MED ORDER — GABAPENTIN 300 MG PO CAPS
300.0000 mg | ORAL_CAPSULE | Freq: Two times a day (BID) | ORAL | Status: DC
  Administered 2013-06-04 – 2013-06-05 (×4): 300 mg via ORAL
  Filled 2013-06-03 (×5): qty 1

## 2013-06-03 MED ORDER — FERROUS SULFATE 325 (65 FE) MG PO TABS
325.0000 mg | ORAL_TABLET | Freq: Every day | ORAL | Status: DC
  Administered 2013-06-04 – 2013-06-05 (×2): 325 mg via ORAL
  Filled 2013-06-03 (×3): qty 1

## 2013-06-03 MED ORDER — HYDROMORPHONE HCL PF 1 MG/ML IJ SOLN
1.0000 mg | Freq: Once | INTRAMUSCULAR | Status: DC
Start: 2013-06-03 — End: 2013-06-03

## 2013-06-03 MED ORDER — FUROSEMIDE 80 MG PO TABS
80.0000 mg | ORAL_TABLET | Freq: Two times a day (BID) | ORAL | Status: DC
  Administered 2013-06-04 – 2013-06-05 (×3): 80 mg via ORAL
  Filled 2013-06-03 (×5): qty 1

## 2013-06-03 MED ORDER — HYDRALAZINE HCL 50 MG PO TABS
100.0000 mg | ORAL_TABLET | Freq: Three times a day (TID) | ORAL | Status: DC
Start: 2013-06-03 — End: 2013-06-05
  Administered 2013-06-04 – 2013-06-05 (×4): 100 mg via ORAL
  Filled 2013-06-03 (×11): qty 2

## 2013-06-03 MED ORDER — PROMETHAZINE HCL 25 MG PO TABS: 25.0000 mg | ORAL_TABLET | Freq: Four times a day (QID) | ORAL | Status: DC | PRN

## 2013-06-03 MED ORDER — CLONIDINE HCL 0.1 MG PO TABS: 0.1000 mg | ORAL_TABLET | Freq: Two times a day (BID) | ORAL | Status: DC

## 2013-06-03 MED ORDER — IOHEXOL 300 MG/ML  SOLN
20.0000 mL | INTRAMUSCULAR | Status: DC
  Administered 2013-06-03: 25 mL via ORAL

## 2013-06-03 MED ORDER — CARVEDILOL 25 MG PO TABS
25.0000 mg | ORAL_TABLET | Freq: Two times a day (BID) | ORAL | Status: DC
  Administered 2013-06-04 – 2013-06-05 (×3): 25 mg via ORAL
  Filled 2013-06-03 (×6): qty 1

## 2013-06-03 MED ORDER — ALBUTEROL SULFATE (2.5 MG/3ML) 0.083% IN NEBU: 2.5000 mg | INHALATION_SOLUTION | Freq: Four times a day (QID) | RESPIRATORY_TRACT | Status: DC | PRN

## 2013-06-03 MED ORDER — HYDROMORPHONE HCL PF 1 MG/ML IJ SOLN
1.0000 mg | Freq: Once | INTRAMUSCULAR | Status: AC
  Administered 2013-06-03: 1 mg via INTRAVENOUS
  Filled 2013-06-03: qty 1

## 2013-06-03 MED ORDER — GI COCKTAIL ~~LOC~~
30.0000 mL | Freq: Once | ORAL | Status: AC
  Administered 2013-06-03: 30 mL via ORAL
  Filled 2013-06-03: qty 30

## 2013-06-03 MED ORDER — ONDANSETRON HCL 4 MG/2ML IJ SOLN
4.0000 mg | Freq: Once | INTRAMUSCULAR | Status: AC
  Administered 2013-06-03: 4 mg via INTRAVENOUS
  Filled 2013-06-03: qty 2

## 2013-06-03 MED ORDER — AMLODIPINE BESYLATE 10 MG PO TABS
10.0000 mg | ORAL_TABLET | Freq: Every day | ORAL | Status: DC
  Administered 2013-06-04 – 2013-06-05 (×2): 10 mg via ORAL
  Filled 2013-06-03 (×2): qty 1

## 2013-06-03 MED ORDER — CLONIDINE HCL 0.2 MG PO TABS
0.2000 mg | ORAL_TABLET | Freq: Every day | ORAL | Status: DC
  Administered 2013-06-04: 0.2 mg via ORAL
  Filled 2013-06-03 (×3): qty 1

## 2013-06-03 MED ORDER — CARVEDILOL 25 MG PO TABS
25.0000 mg | ORAL_TABLET | Freq: Two times a day (BID) | ORAL | Status: DC
  Administered 2013-06-03: 25 mg via ORAL
  Filled 2013-06-03 (×4): qty 1

## 2013-06-03 MED ORDER — MORPHINE SULFATE 2 MG/ML IJ SOLN
2.0000 mg | INTRAMUSCULAR | Status: DC | PRN
  Administered 2013-06-04 (×2): 2 mg via INTRAVENOUS
  Filled 2013-06-03 (×2): qty 1

## 2013-06-03 MED ORDER — ASPIRIN EC 81 MG PO TBEC
81.0000 mg | DELAYED_RELEASE_TABLET | Freq: Every day | ORAL | Status: DC
  Administered 2013-06-04 – 2013-06-05 (×2): 81 mg via ORAL
  Filled 2013-06-03 (×2): qty 1

## 2013-06-03 MED ORDER — CLONIDINE HCL 0.1 MG PO TABS
0.1000 mg | ORAL_TABLET | Freq: Every day | ORAL | Status: DC
  Administered 2013-06-04 – 2013-06-05 (×2): 0.1 mg via ORAL
  Filled 2013-06-03 (×2): qty 1

## 2013-06-03 MED ORDER — NITROGLYCERIN 0.4 MG SL SUBL: 0.4000 mg | SUBLINGUAL_TABLET | SUBLINGUAL | Status: DC | PRN

## 2013-06-03 MED ORDER — ISOSORBIDE MONONITRATE ER 60 MG PO TB24
120.0000 mg | ORAL_TABLET | Freq: Every day | ORAL | Status: DC
  Administered 2013-06-04 – 2013-06-05 (×2): 120 mg via ORAL
  Filled 2013-06-03 (×2): qty 2

## 2013-06-03 NOTE — ED Provider Notes (Signed)
Shamanda Len S 8:00PM pt discussed in sign out.  Pt will be admitted by internal medicine.  Ct scans are pending for chest pain.  8:50PM I was called by radiology.  Pt ct shows dialysis cath is in right ventricle.    9:15PM Spoke with internal medicine residents about dialysis position.  They will plan to follow this.  Angus Sellereter S Geofrey Silliman, PA-C 06/03/13 2122

## 2013-06-03 NOTE — ED Notes (Signed)
Per EMS pt came from home c/o right sided CP under her breast that radiates to back. EMS gave 324 ASA. BP 225/123 and HR 114.

## 2013-06-03 NOTE — ED Provider Notes (Signed)
CSN: [id number]     Arrival date & time 06/03/13  1746 History   First MD Initiated Contact with Patient 06/03/13 1751     No chief complaint on file.  (Consider location/radiation/quality/duration/timing/severity/associated sxs/prior Treatment) Patient is a 58 y.o. female presenting with chest pain and vomiting. The history is provided by the patient.  Chest Pain Pain location:  Epigastric Pain quality: sharp   Pain radiates to:  Does not radiate Pain radiates to the back: no   Pain severity:  Severe Onset quality:  Sudden Timing:  Constant Progression:  Worsening Chronicity:  New Context: no stress and no trauma   Context comment:  Vomiting Associated symptoms: abdominal pain, nausea and vomiting (numerous times today)   Associated symptoms: no cough, no fever and no shortness of breath   Emesis Associated symptoms: abdominal pain     Past Medical History  Diagnosis Date  . CHF (congestive heart failure)     a. HFpEF with RHF/anasarca/pHTN.  . COPD (chronic obstructive pulmonary disease)   . Coronary artery disease     a. Per [place] records: NSTEMI 01/2012, tx medically given ARF but suspected CAD. b. Stress test 12/16/11 reported w/o ischemia.  . Hypertension   . Diabetes mellitus   . OSA (obstructive sleep apnea)     a. Pt reported used to use CPAP in [place], but "ran out" when came to [facility].  Marland Kitchen CKD (chronic kidney disease), stage III     a. Per [place] records: h/o ARF after CTA that ruled out PE.  Marland Kitchen Pulmonary hypertension     a. RHC 02/28/13: mod pulm HTN with normal PVR suggestive of predominantly pulmonary venous HTN.  . Right heart failure   . Anasarca     a. Per [place] records - due to pulm HTN with R HF.   Marland Kitchen Aseptic meningitis     a. 09/2012: adm in [place] for metabolic encephalopathy, oliguric tubular necrosis, anemia, HTN, possible CVA, HHNKA.  Marland Kitchen CVA (cerebral infarction)     a. Per [place] records, "possible CVA" 09/2012 but MRI reportedly negative.  . Abnormal Doppler ultrasound of  carotid artery     a. Per [place] records: <50% LICA.   Past Surgical History  Procedure Laterality Date  . Tubal ligation    . Abdominal hysterectomy    . Esophagogastroduodenoscopy N/A 02/16/2013    Procedure: ESOPHAGOGASTRODUODENOSCOPY (EGD);  Surgeon: Graylin Shiver, MD;  Location: [facility] ENDOSCOPY;  Service: Endoscopy;  Laterality: N/A;  . Insertion of dialysis catheter Right 03/03/2013    Procedure: INSERTION OF DIALYSIS CATHETER;  Surgeon: Larina Earthly, MD;  Location: [facility] OR;  Service: Vascular;  Laterality: Right;  Right Internal Jugular Placement  . Av fistula placement Left 03/03/2013    Procedure: ARTERIOVENOUS (AV) FISTULA CREATION, Brachial/Cephalic;  Surgeon: Larina Earthly, MD;  Location: [facility] OR;  Service: Vascular;  Laterality: Left;   Family History  Problem Relation Age of Onset  . Diabetes Mellitus II Sister   . Diabetes Mellitus II Brother   . CAD Brother    History  Substance Use Topics  . Smoking status: Former Games developer  . Smokeless tobacco: Current User    Types: Snuff, Chew  . Alcohol Use: No   OB History   Grav Para Term Preterm Abortions TAB SAB Ect Mult Living                 Review of Systems  Constitutional: Negative for fever.  Respiratory: Negative for cough and shortness of breath.  Cardiovascular: Positive for chest pain.  Gastrointestinal: Positive for nausea, vomiting (numerous times today) and abdominal pain.  All other systems reviewed and are negative.    Allergies  Review of patient's allergies indicates no known allergies.  Home Medications   Current Outpatient Rx  Name  Route  Sig  Dispense  Refill  . albuterol (PROVENTIL HFA;VENTOLIN HFA) 108 (90 BASE) MCG/ACT inhaler   Inhalation   Inhale 2 puffs into the lungs every 6 (six) hours as needed for wheezing or shortness of breath.          Marland Kitchen amLODipine (NORVASC) 10 MG tablet   Oral   Take 10 mg by mouth daily.         Marland Kitchen aspirin EC 81 MG tablet   Oral   Take 81 mg by mouth daily.          . carvedilol (COREG CR) 40 MG 24 hr capsule   Oral   Take 40 mg by mouth 2 (two) times daily.          . ferrous sulfate 325 (65 FE) MG tablet   Oral   Take 325 mg by mouth daily with breakfast.         . furosemide (LASIX) 40 MG tablet   Oral   Take 80 mg by mouth 2 (two) times daily.          Marland Kitchen gabapentin (NEURONTIN) 300 MG capsule   Oral   Take 300 mg by mouth at bedtime.         . hydrALAZINE (APRESOLINE) 100 MG tablet   Oral   Take 100 mg by mouth 3 (three) times daily.         . insulin glargine (LANTUS) 100 UNIT/ML injection   Subcutaneous   Inject 10 Units into the skin at bedtime.         . insulin lispro (HUMALOG) 100 UNIT/ML injection   Subcutaneous   Inject 3-10 Units into the skin 3 (three) times daily before meals. Sliding scale.  CBG 200: 3 UNITS, 250 5 UNITS, 300 7 UNITS, 350 9-10 UNITS         . isosorbide mononitrate (IMDUR) 120 MG 24 hr tablet   Oral   Take 120 mg by mouth daily.         . metolazone (ZAROXOLYN) 5 MG tablet   Oral   Take 5 mg by mouth 2 (two) times daily.         . nitroGLYCERIN (NITROSTAT) 0.4 MG SL tablet   Sublingual   Place 0.4 mg under the tongue every 5 (five) minutes as needed for chest pain.          . polyethylene glycol (MIRALAX / GLYCOLAX) packet   Oral   Take 17 g by mouth daily as needed (for constipation).   14 each   0   . traMADol (ULTRAM) 50 MG tablet   Oral   Take 50 mg by mouth 3 (three) times daily as needed for pain.          BP 227/75  Pulse 82  Temp(Src) 98.3 F (36.8 C) (Oral)  Resp 19  SpO2 100% Physical Exam  Nursing note and vitals reviewed. Constitutional: She is oriented to person, place, and time. She appears well-developed and well-nourished. No distress.  HENT:  Head: Normocephalic and atraumatic.  Eyes: EOM are normal. Pupils are equal, round, and reactive to light.  Neck: Normal range of motion. Neck supple.  Cardiovascular: Normal rate  and regular  rhythm.  Exam reveals no friction rub.   No murmur heard. Pulmonary/Chest: Effort normal and breath sounds normal. No respiratory distress. She has no wheezes. She has no rales.  Abdominal: Soft. She exhibits no distension. There is no tenderness. There is no rebound.  Musculoskeletal: Normal range of motion. She exhibits no edema.  Neurological: She is alert and oriented to person, place, and time. No cranial nerve deficit. She exhibits normal muscle tone. Coordination normal.  Skin: No rash noted. She is not diaphoretic.    ED Course  Procedures (including critical care time) Labs Review Labs Reviewed  COMPREHENSIVE METABOLIC PANEL - Abnormal; Notable for the following:    CO2 18 (*)    Glucose, Bld 242 (*)    BUN 62 (*)    Creatinine, Ser 3.06 (*)    AST 50 (*)    Alkaline Phosphatase 140 (*)    GFR calc non Af Amer 16 (*)    GFR calc Af Amer 19 (*)    All other components within normal limits  CBC  LIPASE, BLOOD  POCT I-STAT TROPONIN I   Imaging Review Dg Chest Portable 1 View  06/03/2013   CLINICAL DATA:  Chest pain and cough.  EXAM: PORTABLE CHEST - 1 VIEW  COMPARISON:  03/15/2013  FINDINGS: Mild cardiac enlargement. There is a right chest wall dialysis catheter with tips in the right atrium. No pleural effusion or edema identified. No airspace consolidation noted.  IMPRESSION: No active disease.   Electronically Signed   By: Signa Kellaylor  Stroud M.D.   On: 06/03/2013 19:08    EKG Interpretation   None      Date: 06/03/2013  Rate: 80  Rhythm: normal sinus rhythm  QRS Axis: normal  Intervals: normal  ST/T Wave abnormalities: normal  Conduction Disutrbances:none  Narrative Interpretation:   Old EKG Reviewed: changes noted - prior EKGs low voltage, today's EKG with normal T waves    MDM   1. Epigastric pain   2. Chest pain    64F presents with chest pain. Acute onset, began after vomiting. Patient has never had any pain like this before. No fevers, no cough. Mild  SOB. Patient recently placed on dialysis, not up to 3 times a week yet, still makes urine. Recently here for AoCRF.  Hypertensive, 220s/80s. Lungs clear, epigastric pain on exam. No chest pain on palpation. No lower abdominal pain. EKG normal. Dilaudid and zofran given. Will start with labs, CXR, CT Abd/Pelvis w/o contrast. CXR normal. Labs normal. Teaching service admitting for ACS r/o. BP meds given to help decrease her BP.   Dagmar HaitWilliam Lavonta Tillis, MD 06/03/13 2013

## 2013-06-03 NOTE — ED Notes (Signed)
Pt reports pain at dialysis catheter site on right side of chest. Pt reports epigastric pain that started today after episodes of vomiting. Pt had one episode of vomiting here after giving pain medicine.

## 2013-06-03 NOTE — ED Notes (Addendum)
Pt desat on monitor to 87% on room air. Pt aroused with minor stimulation. Pt placed on 2L [place], on continuous pulse ox and cardiac monitoring. NAD noted at this time. Pt resting comfortably. Will continue to monitor. MD made aware.

## 2013-06-03 NOTE — ED Notes (Signed)
PT DENIES CP AT THIS TIME.

## 2013-06-03 NOTE — H&P (Signed)
Date: 06/03/2013               Patient Name:  Madeline [name] MRN: [id number]  DOB: [birth year] Age / Sex: 58 y.o., female   PCP: Christain Sacramento, MD           Medical Service: Internal Medicine Teaching Service         Attending Physician: Dr. Sid Falcon, MD    First Contact: Dr. Rebecca Eaton, MD Pager: [phone]  Second Contact: Dr. Jerene Pitch, MD Pager: [phone]       After Hours (After 5p/  First Contact Pager: [phone]  weekends / holidays): Second Contact Pager: [phone]    Most Recent Discharge Date:  03/18/13  Chief Complaint:  Chief Complaint  Patient presents with  . Chest Pain  . Emesis       History of present illness: Pt is a 58 y.o. female with a PMH of COPD, poorly controlled HTN, DM, OSA, CKDstageIV, Pulmonary HTN, and CVA.  The history provided by the patient was extremely limited due to pt being in severe pain and unable to cooperate with the interview.  She presents to the ED with N/V and chest pain that began this AM.  She states the pain is located mainly in the epigastric region.  She reports having similar pain in the past and was found to have PUD.  She had several episodes of NBNB emesis today.  She denied any fever/chills, SOB, or cough.  She denies any NSAID or alcohol use.    Of note, she was d/c from [facility] on 03/05/13 with acute respiratory failure due to flash pulmonary edema from CKD.  ECHO 02/15/13 LVEF 65% RV dilated. Lasix drip started but ultimately required HD. AVF placed.  Had acute blood loss from catheter and received 3UPRBCs. EGD at that time revealed multiple focal gastric ulcers.    She was again d/c on 03/18/13 for UGI bleed.  GI consulted but recommended treating PUD with PPI as long as hgb stable.  Recommended capsule endoscopy if hgb drops again with documented heme positive stools.    Today, he was hypertensive in the ED with BP 220s/80s and was given norvasc 87m, coreg 252m dilaudid, zofran, and GI cocktail.      Meds: Current  Facility-Administered Medications  Medication Dose Route Frequency Provider Last Rate Last Dose  . albuterol (PROVENTIL) (2.5 MG/3ML) 0.083% nebulizer solution 2.5 mg  2.5 mg Inhalation Q6H PRN NoClinton GallantMD      . [SDerrill MemoN 06/04/2013] amLODipine (NORVASC) tablet 10 mg  10 mg Oral Daily NoClinton GallantMD      . [SDerrill MemoN 06/04/2013] aspirin EC tablet 81 mg  81 mg Oral Daily NoClinton GallantMD      . carvedilol (COREG) tablet 25 mg  25 mg Oral BID WC WiOsvaldo ShipperMD   25 mg at 06/03/13 2034  . [START ON 06/04/2013] carvedilol (COREG) tablet 25 mg  25 mg Oral BID WC NoClinton GallantMD      . [SDerrill MemoN 06/04/2013] cloNIDine (CATAPRES) tablet 0.1 mg  0.1 mg Oral Daily EmSid FalconMD       And  . cloNIDine (CATAPRES) tablet 0.2 mg  0.2 mg Oral QHS EmSid FalconMD      . [SDerrill MemoN 06/04/2013] ferrous sulfate tablet 325 mg  325 mg Oral Q breakfast NoClinton GallantMD      . [SDerrill MemoN 06/04/2013] furosemide (LASIX) tablet 80 mg  80  mg Oral BID Clinton Gallant, MD      . gabapentin (NEURONTIN) capsule 300 mg  300 mg Oral BID Clinton Gallant, MD      . Derrill Memo ON 06/04/2013] heparin injection 5,000 Units  5,000 Units Subcutaneous Q8H Clinton Gallant, MD      . hydrALAZINE (APRESOLINE) tablet 100 mg  100 mg Oral Q8H Clinton Gallant, MD      . Derrill Memo ON 06/04/2013] insulin aspart (novoLOG) injection 0-9 Units  0-9 Units Subcutaneous TID WC Clinton Gallant, MD      . insulin glargine (LANTUS) injection 10 Units  10 Units Subcutaneous QHS Clinton Gallant, MD      . Derrill Memo ON 06/04/2013] isosorbide mononitrate (IMDUR) 24 hr tablet 120 mg  120 mg Oral Daily Clinton Gallant, MD      . Derrill Memo ON 06/04/2013] metolazone (ZAROXOLYN) tablet 5 mg  5 mg Oral BID Clinton Gallant, MD      . morphine 2 MG/ML injection 2 mg  2 mg Intravenous Q3H PRN Clinton Gallant, MD      . nitroGLYCERIN (NITROSTAT) SL tablet 0.4 mg  0.4 mg Sublingual Q5 min PRN Osvaldo Shipper, MD      . pantoprazole (PROTONIX) injection 40 mg  40 mg Intravenous Q12H Clinton Gallant, MD      .  polyethylene glycol (MIRALAX / GLYCOLAX) packet 17 g  17 g Oral Daily PRN Clinton Gallant, MD      . promethazine (PHENERGAN) tablet 25 mg  25 mg Oral Q6H PRN Clinton Gallant, MD      . sodium chloride 0.9 % injection 3 mL  3 mL Intravenous Q12H Clinton Gallant, MD        Allergies: Allergies as of 06/03/2013  . (No Known Allergies)    PMH: Past Medical History  Diagnosis Date  . CHF (congestive heart failure)     a. HFpEF with RHF/anasarca/pHTN.  . COPD (chronic obstructive pulmonary disease)   . Coronary artery disease     a. Per [place] records: NSTEMI 01/2012, tx medically given ARF but suspected CAD. b. Stress test 12/16/11 reported w/o ischemia.  . Hypertension   . Diabetes mellitus   . OSA (obstructive sleep apnea)     a. Pt reported used to use CPAP in [place], but "ran out" when came to [facility].  Marland Kitchen CKD (chronic kidney disease), stage III     a. Per [place] records: h/o ARF after CTA that ruled out PE.  Marland Kitchen Pulmonary hypertension     a. [place] 02/28/13: mod pulm HTN with normal PVR suggestive of predominantly pulmonary venous HTN.  . Right heart failure   . Anasarca     a. Per [place] records - due to pulm HTN with R HF.   Marland Kitchen Aseptic meningitis     a. 09/2012: adm in [place] for metabolic encephalopathy, oliguric tubular necrosis, anemia, HTN, possible CVA, HHNKA.  Marland Kitchen CVA (cerebral infarction)     a. Per [place] records, "possible CVA" 09/2012 but MRI reportedly negative.  . Abnormal Doppler ultrasound of carotid artery     a. Per  records: <29% LICA.    PSH: Past Surgical History  Procedure Laterality Date  . Tubal ligation    . Abdominal hysterectomy    . Esophagogastroduodenoscopy N/A 02/16/2013    Procedure: ESOPHAGOGASTRODUODENOSCOPY (EGD);  Surgeon: Wonda Horner, MD;  Location: [facility] ENDOSCOPY;  Service: Endoscopy;  Laterality: N/A;  . Insertion of dialysis catheter Right 03/03/2013    Procedure: INSERTION OF DIALYSIS CATHETER;  Surgeon:  Rosetta Posner, MD;  Location: [facility];  Service: Vascular;  Laterality: Right;  Right  Internal Jugular Placement  . Av fistula placement Left 03/03/2013    Procedure: ARTERIOVENOUS (AV) FISTULA CREATION, Brachial/Cephalic;  Surgeon: Rosetta Posner, MD;  Location: [facility] OR;  Service: Vascular;  Laterality: Left;    FH: Family History  Problem Relation Age of Onset  . Diabetes Mellitus II Sister   . Diabetes Mellitus II Brother   . CAD Brother     SH: History  Substance Use Topics  . Smoking status: Former Research scientist (life sciences)  . Smokeless tobacco: Current User    Types: Snuff, Chew  . Alcohol Use: No    Review of Systems: Pertinent items are noted in HPI.  Physical Exam: Blood pressure 195/67, pulse 74, temperature 97.8 F (36.6 C), temperature source Oral, resp. rate 15, height _0  (1.448 m), SpO2 100.00%.  Physical Exam  Constitutional: She appears distressed.  HENT:  Head: Normocephalic and atraumatic.  Eyes: Conjunctivae and EOM are normal.  Cardiovascular: Normal rate, regular rhythm, normal heart sounds and intact distal pulses.   Pulmonary/Chest: Effort normal and breath sounds normal. No respiratory distress. She exhibits no tenderness.  Abdominal: Soft. Bowel sounds are normal. She exhibits no distension. There is tenderness in the epigastric area. There is no rigidity, no rebound and no guarding.  Neurological: She is alert.  Skin: Skin is warm, dry and intact. She is not diaphoretic.  LE chronic venous stasis dermatitis    Lab results:  Basic Metabolic Panel:  Recent Labs  06/03/13 1803  NA 140  K 4.5  CL 97  CO2 18*  GLUCOSE 242*  BUN 62*  CREATININE 3.06*  CALCIUM 10.1   Anion Gap: 25  Liver Function Tests:  Recent Labs  06/03/13 1803  AST 50*  ALT 34  ALKPHOS 140*  BILITOT 0.4  PROT 8.1  ALBUMIN 3.6    Recent Labs  06/03/13 1803  LIPASE 49    CBC:    Component Value Date/Time   WBC 9.3 06/03/2013 1803   RBC 4.43 06/03/2013 1803   RBC 2.79* 02/18/2013 1345   HGB 13.5 06/03/2013 1803   HCT 38.4 06/03/2013 1803   PLT 242  06/03/2013 1803   MCV 86.7 06/03/2013 1803   MCH 30.5 06/03/2013 1803   MCHC 35.2 06/03/2013 1803   RDW 13.0 06/03/2013 1803   LYMPHSABS 0.6* 03/15/2013 1530   MONOABS 0.6 03/15/2013 1530   EOSABS 0.1 03/15/2013 1530   BASOSABS 0.0 03/15/2013 1530    Cardiac Enzymes:  Recent Labs  06/03/13 1835  TROPIPOC 0.03   Lab Results  Component Value Date   TROPONINI <0.30 03/16/2013    BNP: No results found for this basename: PROBNP,  in the last 72 hours  D-Dimer: No results found for this basename: DDIMER,  in the last 72 hours  CBG: No results found for this basename: GLUCAP,  in the last 72 hours  Hemoglobin A1C: No results found for this basename: HGBA1C,  in the last 72 hours  Lipid Panel: No results found for this basename: CHOL, HDL, LDLCALC, TRIG, CHOLHDL, LDLDIRECT,  in the last 72 hours  Thyroid Function Tests: No results found for this basename: TSH, T4TOTAL, FREET4, T3FREE, THYROIDAB,  in the last 72 hours  Anemia Panel: No results found for this basename: VITAMINB12, FOLATE, FERRITIN, TIBC, IRON, RETICCTPCT,  in the last 72 hours  Coagulation: No results found for this basename: LABPROT, INR,  in the last 72  hours  Urine Drug Screen: Drugs of Abuse:  No results found for this basename: labopia,  cocainscrnur,  labbenz,  amphetmu,  thcu,  labbarb    Alcohol Level: No results found for this basename: ETH,  in the last 72 hours  Urinalysis: No results found for this basename: COLORURINE, APPERANCEUR, LABSPEC, PHURINE, GLUCOSEU, HGBUR, BILIRUBINUR, KETONESUR, PROTEINUR, UROBILINOGEN, NITRITE, LEUKOCYTESUR,  in the last 72 hours  Imaging results:  Ct Abdomen Pelvis Wo Contrast  06/03/2013   CLINICAL DATA:  Right-sided chest pain.  Vomiting.  Dialysis.  EXAM: CT CHEST WITHOUT CONTRAST; CT ABDOMEN AND PELVIS WITHOUT CONTRAST  TECHNIQUE: Multidetector CT imaging of the chest was performed following the standard protocol without IV contrast.; Multidetector CT imaging of  the abdomen and pelvis was performed following the standard protocol without intravenous contrast.  COMPARISON:  CT 02/15/2013.  FINDINGS: CT chest:  Dialysis catheter noted. Is tip is projected into the right ventricle. Shotty mediastinal lymph nodes noted. Thoracic esophagus is unremarkable.  Thoracic aorta normal caliber. Cardiomegaly. Coronary artery disease. No prominent pericardial effusion.  Large airways are patent. Basilar atelectasis. Mild pneumonia in the lung bases cannot be completely excluded. No pleural effusion or pneumothorax.  Thyroid unremarkable. Supraclavicular and axillary regions are unremarkable. Diffuse anasarca noted of the chest wall.  CT abdomen and pelvis:  No focal hepatic abnormality. The spleen is normal. The pancreas is normal. No significant biliary distention.  Adrenals are unremarkable. No hydronephrosis. No obstructing ureteral stone. The bladder is nondistended. Hysterectomy. No free pelvic fluid collections.  No significant adenopathy.  Aorta normal caliber.  Appendiceal region normal. No inflammatory change in right or left lower quadrant. Stool is noted throughout the colon. Sliding hiatal hernia.  Diffuse mild anasarca noted of the abdominal wall. Small umbilical hernia with herniation of fat only. Diffuse degenerative change thoracolumbar spine and both hips.  IMPRESSION: 1. Dialysis catheter noted, its tip is projected into the right ventricle. 2. Coronary artery disease.  Cardiomegaly. 3. Mild basilar atelectasis. Mild pneumonia lung bases cannot be excluded. This report was phoned to PA Hazel Sams at 8:52 p.m. on 06/03/2013.   Electronically Signed   By: Marcello Moores  Register   On: 06/03/2013 20:53   Ct Chest Wo Contrast  06/03/2013   CLINICAL DATA:  Right-sided chest pain.  Vomiting.  Dialysis.  EXAM: CT CHEST WITHOUT CONTRAST; CT ABDOMEN AND PELVIS WITHOUT CONTRAST  TECHNIQUE: Multidetector CT imaging of the chest was performed following the standard protocol without  IV contrast.; Multidetector CT imaging of the abdomen and pelvis was performed following the standard protocol without intravenous contrast.  COMPARISON:  CT 02/15/2013.  FINDINGS: CT chest:  Dialysis catheter noted. Is tip is projected into the right ventricle. Shotty mediastinal lymph nodes noted. Thoracic esophagus is unremarkable.  Thoracic aorta normal caliber. Cardiomegaly. Coronary artery disease. No prominent pericardial effusion.  Large airways are patent. Basilar atelectasis. Mild pneumonia in the lung bases cannot be completely excluded. No pleural effusion or pneumothorax.  Thyroid unremarkable. Supraclavicular and axillary regions are unremarkable. Diffuse anasarca noted of the chest wall.  CT abdomen and pelvis:  No focal hepatic abnormality. The spleen is normal. The pancreas is normal. No significant biliary distention.  Adrenals are unremarkable. No hydronephrosis. No obstructing ureteral stone. The bladder is nondistended. Hysterectomy. No free pelvic fluid collections.  No significant adenopathy.  Aorta normal caliber.  Appendiceal region normal. No inflammatory change in right or left lower quadrant. Stool is noted throughout the colon. Sliding hiatal hernia.  Diffuse mild  anasarca noted of the abdominal wall. Small umbilical hernia with herniation of fat only. Diffuse degenerative change thoracolumbar spine and both hips.  IMPRESSION: 1. Dialysis catheter noted, its tip is projected into the right ventricle. 2. Coronary artery disease.  Cardiomegaly. 3. Mild basilar atelectasis. Mild pneumonia lung bases cannot be excluded. This report was phoned to PA Hazel Sams at 8:52 p.m. on 06/03/2013.   Electronically Signed   By: Marcello Moores  Register   On: 06/03/2013 20:53   Dg Chest Portable 1 View  06/03/2013   CLINICAL DATA:  Chest pain and cough.  EXAM: PORTABLE CHEST - 1 VIEW  COMPARISON:  03/15/2013  FINDINGS: Mild cardiac enlargement. There is a right chest wall dialysis catheter with tips in the  right atrium. No pleural effusion or edema identified. No airspace consolidation noted.  IMPRESSION: No active disease.   Electronically Signed   By: Kerby Moors M.D.   On: 06/03/2013 19:08    EKG:  ED ECG REPORT   Date: 06/03/2013  EKG Time: 11:53 PM  Rate: 80  Rhythm: normal sinus rhythm,  unchanged from previous tracings  Axis: Right  ST&T Change: No change from prior    Antibiotics: Antibiotics Given (last 72 hours)   None      Anti-infectives   None      SIRS/Sepsis/Septic Shock criteria met: No  Consults:    Assessment & Plan by Problem: Principal Problem:   Chest pain Active Problems:   DM (diabetes mellitus)   HTN (hypertension), malignant   Chronic kidney disease (CKD), stage IV (severe)   OSA on CPAP   Chronic diastolic heart failure   PUD (peptic ulcer disease)  Pt is a 58 y.o. female has PMH of COPD, CAD, HTN, DM, OSA, CKDIV, Pulmonary HTN, CVA.  Pt presents to the ED with epigastric pain, N/V.    #Epigastric Pain- Pt presents with acute epigastric pain with associated N/V that began this AM.  Possibilities include PUD (likely given EGD findings), pancreatitis (unlikely lipase 49), ACS (TIMI score: 1), Pneumonia (unlikely given afebrile, not tachycardic/tachypneic and without leukocytosis), PE (Wells score: 0).  Will need to r/o ACS given multiple risk factors-POC troponin negative in ED.   -telemetry -troponin x3 -protonix 8m IV q12hrs -zofran PRN  -CBC/CMP in AM  #Uncontrolled hypertension- May be elevated in the setting of acute pain and/or non-compliance with home meds.  -continue home meds -monitor; may need adjustment to meds  #CKD stage IV - Pt AV fistula on 10/26.  According to ED notes, she has been receiving HD but not 3xwk.  Baseline Cr 2.5-3.5.  Pt still has right chest dialysis catheter that has tips in RA.  -monitor CMP -removal of dialysis catheter  #Chronic diastolic HF - 140/98TTE: LVEF 65% with LVH and bilaterally dilated  atrium and diastolic filling impairment.  Pt currently stable.  -continue current meds  #DMII- HgbA1c 7.7 on 10/14.  -SSI-s -lantus 10units  #Obstructive Sleep Apnea - h/o OSA on home CPAP  -Cont CPAP    #FEN- NS-KVO  Electrolytes-Replete as needed  Diet-NPO    #VTE prophylaxis- SCDs    #Dispo- Disposition is deferred at this time, awaiting improvement of current medical problems.  Anticipated discharge in approximately 1-2 day(s).    Emergency Contact: Contact Information   Name Relation Home Work MRivertonDaughter [phone]       The patient does have a current PCP (Christain Sacramento MD) and [facility].  Signed: Michail Jewels, MD PGY-1, Internal Medicine  06/03/2013, 11:45 PM

## 2013-06-04 DIAGNOSIS — N186 End stage renal disease: Secondary | ICD-10-CM

## 2013-06-04 LAB — COMPREHENSIVE METABOLIC PANEL
ALT: 27 U/L (ref 0–35)
AST: 34 U/L (ref 0–37)
Albumin: 2.9 g/dL — ABNORMAL LOW (ref 3.5–5.2)
Alkaline Phosphatase: 113 U/L (ref 39–117)
BUN: 67 mg/dL — AB (ref 6–23)
CO2: 19 mEq/L (ref 19–32)
CREATININE: 3.21 mg/dL — AB (ref 0.50–1.10)
Calcium: 9.2 mg/dL (ref 8.4–10.5)
Chloride: 99 mEq/L (ref 96–112)
GFR calc Af Amer: 18 mL/min — ABNORMAL LOW (ref >90)
GFR calc non Af Amer: 15 mL/min — ABNORMAL LOW (ref >90)
Glucose, Bld: 398 mg/dL — ABNORMAL HIGH (ref 70–99)
POTASSIUM: 5.1 meq/L (ref 3.7–5.3)
Sodium: 140 mEq/L (ref 137–147)
TOTAL PROTEIN: 6.9 g/dL (ref 6.0–8.3)
Total Bilirubin: <0.2 mg/dL — ABNORMAL LOW (ref 0.3–1.2)

## 2013-06-04 LAB — GLUCOSE, CAPILLARY
GLUCOSE-CAPILLARY: 342 mg/dL — AB (ref 70–99)
GLUCOSE-CAPILLARY: 204 mg/dL — AB (ref 70–99)
GLUCOSE-CAPILLARY: 231 mg/dL — AB (ref 70–99)
Glucose-Capillary: 339 mg/dL — ABNORMAL HIGH (ref 70–99)

## 2013-06-04 LAB — CBC
HEMATOCRIT: 34.5 % — AB (ref 36.0–46.0)
Hemoglobin: 11.5 g/dL — ABNORMAL LOW (ref 12.0–15.0)
MCH: 30 pg (ref 26.0–34.0)
MCHC: 33.3 g/dL (ref 30.0–36.0)
MCV: 90.1 fL (ref 78.0–100.0)
Platelets: 205 10*3/uL (ref 150–400)
RBC: 3.83 MIL/uL — AB (ref 3.87–5.11)
RDW: 13.7 % (ref 11.5–15.5)
WBC: 8.8 10*3/uL (ref 4.0–10.5)

## 2013-06-04 LAB — TROPONIN I
Troponin I: <0.3 ng/mL (ref <0.30)
Troponin I: <0.3 ng/mL (ref <0.30)

## 2013-06-04 NOTE — Progress Notes (Signed)
Subjective: Patient feels well this morning. Her symptoms have completely resolved and she is hungry. She tells me that she was instructed to stop taking protonix after she was discharged in 02/2013. She has had intermittent nausea w/ epigastric abd pain since December, that worsened yesterday which is why she came to the ED yesterday.    Objective: Vital signs in last 24 hours: Filed Vitals:   06/04/13 0537 06/04/13 0700 06/04/13 1019 06/04/13 1021  BP: 181/75 145/65 158/72 158/72  Pulse: 83 76    Temp: 98.4 F (36.9 C)     TempSrc: Oral     Resp: 16     Height:      SpO2: 100%      Weight change:   Intake/Output Summary (Last 24 hours) at 06/04/13 1120 Last data filed at 06/04/13 1022  Gross per 24 hour  Intake      3 ml  Output    425 ml  Net   -422 ml   Physical Exam General: alert, cooperative, and in no apparent distress HEENT: pupils equal round and reactive to light, vision grossly intact, oropharynx clear and non-erythematous  Neck: supple Lungs: clear to ascultation bilaterally, normal work of respiration, no wheezes, rales, ronchi; PC to R chest wall Heart: regular rate and rhythm, no murmurs, gallops, or rubs Abdomen: soft, mild TTP to epigastrium, non-distended, normal bowel sounds Extremities: warm, no pedal edema Neurologic: alert & oriented X3, cranial nerves II-XII grossly intact, strength grossly intact, sensation intact to light touch  Lab Results: Basic Metabolic Panel:  Recent Labs Lab 06/03/13 1803 06/04/13 0602  NA 140 140  K 4.5 5.1  CL 97 99  CO2 18* 19  GLUCOSE 242* 398*  BUN 62* 67*  CREATININE 3.06* 3.21*  CALCIUM 10.1 9.2   Liver Function Tests:  Recent Labs Lab 06/03/13 1803 06/04/13 0602  AST 50* 34  ALT 34 27  ALKPHOS 140* 113  BILITOT 0.4 <0.2*  PROT 8.1 6.9  ALBUMIN 3.6 2.9*    Recent Labs Lab 06/03/13 1803  LIPASE 49   CBC:  Recent Labs Lab 06/03/13 1803 06/04/13 0602  WBC 9.3 8.8  HGB 13.5 11.5*  HCT  38.4 34.5*  MCV 86.7 90.1  PLT 242 205   Cardiac Enzymes:  Recent Labs Lab 06/04/13 0133 06/04/13 0602  TROPONINI <0.30 <0.30   CBG:  Recent Labs Lab 06/04/13 0743  GLUCAP 342*   Studies/Results: Ct Abdomen Pelvis Wo Contrast  06/03/2013   CLINICAL DATA:  Right-sided chest pain.  Vomiting.  Dialysis.  EXAM: CT CHEST WITHOUT CONTRAST; CT ABDOMEN AND PELVIS WITHOUT CONTRAST  TECHNIQUE: Multidetector CT imaging of the chest was performed following the standard protocol without IV contrast.; Multidetector CT imaging of the abdomen and pelvis was performed following the standard protocol without intravenous contrast.  COMPARISON:  CT 02/15/2013.  FINDINGS: CT chest:  Dialysis catheter noted. Is tip is projected into the right ventricle. Shotty mediastinal lymph nodes noted. Thoracic esophagus is unremarkable.  Thoracic aorta normal caliber. Cardiomegaly. Coronary artery disease. No prominent pericardial effusion.  Large airways are patent. Basilar atelectasis. Mild pneumonia in the lung bases cannot be completely excluded. No pleural effusion or pneumothorax.  Thyroid unremarkable. Supraclavicular and axillary regions are unremarkable. Diffuse anasarca noted of the chest wall.  CT abdomen and pelvis:  No focal hepatic abnormality. The spleen is normal. The pancreas is normal. No significant biliary distention.  Adrenals are unremarkable. No hydronephrosis. No obstructing ureteral stone. The bladder is nondistended.  Hysterectomy. No free pelvic fluid collections.  No significant adenopathy.  Aorta normal caliber.  Appendiceal region normal. No inflammatory change in right or left lower quadrant. Stool is noted throughout the colon. Sliding hiatal hernia.  Diffuse mild anasarca noted of the abdominal wall. Small umbilical hernia with herniation of fat only. Diffuse degenerative change thoracolumbar spine and both hips.  IMPRESSION: 1. Dialysis catheter noted, its tip is projected into the right  ventricle. 2. Coronary artery disease.  Cardiomegaly. 3. Mild basilar atelectasis. Mild pneumonia lung bases cannot be excluded. This report was phoned to PA Ivonne Andrew at 8:52 p.m. on 06/03/2013.   Electronically Signed   By: Maisie Fus  Register   On: 06/03/2013 20:53   Ct Chest Wo Contrast  06/03/2013   CLINICAL DATA:  Right-sided chest pain.  Vomiting.  Dialysis.  EXAM: CT CHEST WITHOUT CONTRAST; CT ABDOMEN AND PELVIS WITHOUT CONTRAST  TECHNIQUE: Multidetector CT imaging of the chest was performed following the standard protocol without IV contrast.; Multidetector CT imaging of the abdomen and pelvis was performed following the standard protocol without intravenous contrast.  COMPARISON:  CT 02/15/2013.  FINDINGS: CT chest:  Dialysis catheter noted. Is tip is projected into the right ventricle. Shotty mediastinal lymph nodes noted. Thoracic esophagus is unremarkable.  Thoracic aorta normal caliber. Cardiomegaly. Coronary artery disease. No prominent pericardial effusion.  Large airways are patent. Basilar atelectasis. Mild pneumonia in the lung bases cannot be completely excluded. No pleural effusion or pneumothorax.  Thyroid unremarkable. Supraclavicular and axillary regions are unremarkable. Diffuse anasarca noted of the chest wall.  CT abdomen and pelvis:  No focal hepatic abnormality. The spleen is normal. The pancreas is normal. No significant biliary distention.  Adrenals are unremarkable. No hydronephrosis. No obstructing ureteral stone. The bladder is nondistended. Hysterectomy. No free pelvic fluid collections.  No significant adenopathy.  Aorta normal caliber.  Appendiceal region normal. No inflammatory change in right or left lower quadrant. Stool is noted throughout the colon. Sliding hiatal hernia.  Diffuse mild anasarca noted of the abdominal wall. Small umbilical hernia with herniation of fat only. Diffuse degenerative change thoracolumbar spine and both hips.  IMPRESSION: 1. Dialysis catheter  noted, its tip is projected into the right ventricle. 2. Coronary artery disease.  Cardiomegaly. 3. Mild basilar atelectasis. Mild pneumonia lung bases cannot be excluded. This report was phoned to PA Ivonne Andrew at 8:52 p.m. on 06/03/2013.   Electronically Signed   By: Maisie Fus  Register   On: 06/03/2013 20:53   Dg Chest Portable 1 View  06/03/2013   CLINICAL DATA:  Chest pain and cough.  EXAM: PORTABLE CHEST - 1 VIEW  COMPARISON:  03/15/2013  FINDINGS: Mild cardiac enlargement. There is a right chest wall dialysis catheter with tips in the right atrium. No pleural effusion or edema identified. No airspace consolidation noted.  IMPRESSION: No active disease.   Electronically Signed   By: Signa Kell M.D.   On: 06/03/2013 19:08   Medications: I have reviewed the patient's current medications. Scheduled Meds: . amLODipine  10 mg Oral Daily  . aspirin EC  81 mg Oral Daily  . carvedilol  25 mg Oral BID WC  . carvedilol  25 mg Oral BID WC  . cloNIDine  0.1 mg Oral Daily   And  . cloNIDine  0.2 mg Oral QHS  . ferrous sulfate  325 mg Oral Q breakfast  . furosemide  80 mg Oral BID  . gabapentin  300 mg Oral BID  . heparin  5,000  Units Subcutaneous Q8H  . hydrALAZINE  100 mg Oral Q8H  . insulin aspart  0-9 Units Subcutaneous TID WC  . insulin glargine  10 Units Subcutaneous QHS  . isosorbide mononitrate  120 mg Oral Daily  . metolazone  5 mg Oral BID  . pantoprazole (PROTONIX) IV  40 mg Intravenous Q12H  . sodium chloride  3 mL Intravenous Q12H   Continuous Infusions:  PRN Meds:.albuterol, morphine injection, nitroGLYCERIN, polyethylene glycol, promethazine  Assessment/Plan: # Epigastric Pain- Symptoms have resolved. Likely 2/2 the fact that she stopped taking protonix for the past 2-3 months. She just recently restarted her protonix on 1/9 after her appointment with Dr. Arrie Aranoladonato. She will need to continue taking protonix at home given her hx of PUD. Troponins negative x 2, awaiting one  more. It is possible that some of patient's pain yesterday was caused by the PC tip being in her RV. The PC was pulled this AM by [facility]. -protonix 40mg  IV q12h  -promethazine prn  #Uncontrolled hypertension- BP 220s/80s on admission, likely 2/2 medication noncompliance. BP is much improved this AM after patient has been on her home regimen.  May be elevated in the setting of acute pain and/or non-compliance with home meds.  -continue home meds: norvasc 10mg  daily, lasix 80mg  BID, metolazone 5mg  BID, clonidine .1mg  qAM and .2mg  qPM, coreg 25mg  IBD, hydralazine 100mg  TID -monitor; may need adjustment to meds   #CKD stage IV - Cr at baseline, Cr 3.06-->3.21 this morning (B/L 2.5-3.5). Pt AV fistula placed on 10/26 in preparation for HD during her admission for acute volume overload (thought to be 2/2 CKD). Patient was dialyzed per temporary R sided PC twice during this admission in October, though has not been dialyzed since. She has an appointment with Dr. Imogene Burnhen with [facility] on 1/30 for reevaluation of L arm AVF. I spoke with Dr. Darrick Pennaeterding this morning and he agreed that [facility] was OK [facility]  -Dr. Arbie CookeyEarly with [facility] pulled out PC this AM  #Chronic diastolic HF - 10/14 TTE: LVEF 91%65% with LVH and bilaterally dilated atrium and diastolic filling impairment. Pt currently stable and euvolemic. -continue current meds: BP meds above as well as IMDUR 120mg  daily  #DMII- HgbA1c 7.7 on 10/14. Patient is on lantus 10U qHS and lispro 3-10u per sliding scale at home. -SSI-s  -lantus 10U qHS  #VTE prophylaxis- SCDs   Dispo: Disposition is deferred at this time, awaiting improvement of current medical problems.  Anticipated discharge in approximately 1 day(s).   The patient does have a current PCP Barbie Banner([name] Wilson, MD) and [facility].  The patient does not have transportation limitations that hinder transportation to clinic appointments.  .Services  Needed at time of discharge: Y = Yes, Blank = No PT:   OT:   RN:   Equipment:   Other:     LOS: 1 day   Windell Hummingbirdachel Mersades Barbaro, MD 06/04/2013, 11:20 AM

## 2013-06-04 NOTE — ED Provider Notes (Signed)
Medical screening examination/treatment/procedure(s) were performed by non-physician practitioner and as supervising physician I was immediately available for consultation/collaboration.  EKG Interpretation    Date/Time:    Ventricular Rate:    PR Interval:    QRS Duration:   QT Interval:    QTC Calculation:   R Axis:     Text Interpretation:                Richardean Canalavid H Yao, MD 06/04/13 1322

## 2013-06-04 NOTE — H&P (Signed)
Internal Medicine Attending Admission Note Date: 06/04/2013  Patient name: Madeline [name] Medical record number: [id number] Date of birth: Dec 09, 1955 Age: 58 y.o. Gender: female  I saw and evaluated the patient. I reviewed the resident's note and I agree with the resident's findings and plan as documented in the resident's note, with the following additional comments.  Chief Complaint(s): Chest pain  History - key components related to admission: Patient is a 58 year old woman with history of stage IV chronic kidney disease, diabetes mellitus, obstructive sleep apnea, anemia, focal antral ulcers noted on EGD in October 2014, acute respiratory failure with hypoxia due to flash pulmonary edema in October 2014, malignant hypertension, reported history of MI, and other problems as outlined in the medical history, admitted with complaint of epigastric and substernal chest pain.  Patient also reports nausea and vomiting which has occurred over the past couple of weeks; she denies hematemesis, bright blood per rectum, or melena.     Physical Exam - key components related to admission:  Filed Vitals:   06/03/13 2304 06/04/13 0512 06/04/13 0537 06/04/13 0700  BP: 195/67 181/75 181/75 145/65  Pulse: 74  83 76  Temp: 97.8 F (36.6 C)  98.4 F (36.9 C)   TempSrc: Oral  Oral   Resp: 15  16   Height: 4\' 9"  (1.448 m)     SpO2: 100%  100%    General: Alert, no distress Lungs: Clear Heart: Regular; S1-S2, 3/6 systolic murmur; no S3, no S4 Abdomen: Bowel sounds present, soft; moderate epigastric tenderness; no hepatosplenomegaly Extremities: No edema.  The left arm AV fistula site appears well healed, with palpable pulse and audible bruit  Lab results:   Basic Metabolic Panel:  Recent Labs  40/98/1101/24/15 1803 06/04/13 0602  NA 140 140  K 4.5 5.1  CL 97 99  CO2 18* 19  GLUCOSE 242* 398*  BUN 62* 67*  CREATININE 3.06* 3.21*  CALCIUM 10.1 9.2    Liver Function Tests:  Recent Labs   06/03/13 1803 06/04/13 0602  AST 50* 34  ALT 34 27  ALKPHOS 140* 113  BILITOT 0.4 <0.2*  PROT 8.1 6.9  ALBUMIN 3.6 2.9*    Recent Labs  06/03/13 1803  LIPASE 49    CBC:  Recent Labs  06/03/13 1803 06/04/13 0602  WBC 9.3 8.8  HGB 13.5 11.5*  HCT 38.4 34.5*  MCV 86.7 90.1  PLT 242 205    Cardiac Enzymes:  Recent Labs  06/04/13 0133 06/04/13 0602  TROPONINI <0.30 <0.30    CBG:  Recent Labs  06/04/13 0743  GLUCAP 342*      Urinalysis    Component Value Date/Time   COLORURINE YELLOW 03/15/2013 1745   APPEARANCEUR CLEAR 03/15/2013 1745   LABSPEC 1.011 03/15/2013 1745   PHURINE 5.5 03/15/2013 1745   GLUCOSEU NEGATIVE 03/15/2013 1745   HGBUR NEGATIVE 03/15/2013 1745   BILIRUBINUR NEGATIVE 03/15/2013 1745   KETONESUR NEGATIVE 03/15/2013 1745   PROTEINUR >300* 03/15/2013 1745   UROBILINOGEN 0.2 03/15/2013 1745   NITRITE NEGATIVE 03/15/2013 1745   LEUKOCYTESUR NEGATIVE 03/15/2013 1745     Imaging results:  Ct Abdomen Pelvis Wo Contrast  06/03/2013   CLINICAL DATA:  Right-sided chest pain.  Vomiting.  Dialysis.  EXAM: CT CHEST WITHOUT CONTRAST; CT ABDOMEN AND PELVIS WITHOUT CONTRAST  TECHNIQUE: Multidetector CT imaging of the chest was performed following the standard protocol without IV contrast.; Multidetector CT imaging of the abdomen and pelvis was performed following the standard protocol without intravenous contrast.  COMPARISON:  CT 02/15/2013.  FINDINGS: CT chest:  Dialysis catheter noted. Is tip is projected into the right ventricle. Shotty mediastinal lymph nodes noted. Thoracic esophagus is unremarkable.  Thoracic aorta normal caliber. Cardiomegaly. Coronary artery disease. No prominent pericardial effusion.  Large airways are patent. Basilar atelectasis. Mild pneumonia in the lung bases cannot be completely excluded. No pleural effusion or pneumothorax.  Thyroid unremarkable. Supraclavicular and axillary regions are unremarkable. Diffuse anasarca noted of the  chest wall.  CT abdomen and pelvis:  No focal hepatic abnormality. The spleen is normal. The pancreas is normal. No significant biliary distention.  Adrenals are unremarkable. No hydronephrosis. No obstructing ureteral stone. The bladder is nondistended. Hysterectomy. No free pelvic fluid collections.  No significant adenopathy.  Aorta normal caliber.  Appendiceal region normal. No inflammatory change in right or left lower quadrant. Stool is noted throughout the colon. Sliding hiatal hernia.  Diffuse mild anasarca noted of the abdominal wall. Small umbilical hernia with herniation of fat only. Diffuse degenerative change thoracolumbar spine and both hips.  IMPRESSION: 1. Dialysis catheter noted, its tip is projected into the right ventricle. 2. Coronary artery disease.  Cardiomegaly. 3. Mild basilar atelectasis. Mild pneumonia lung bases cannot be excluded. This report was phoned to PA Ivonne Andrew at 8:52 p.m. on 06/03/2013.   Electronically Signed   By: Maisie Fus  Register   On: 06/03/2013 20:53   Ct Chest Wo Contrast  06/03/2013   CLINICAL DATA:  Right-sided chest pain.  Vomiting.  Dialysis.  EXAM: CT CHEST WITHOUT CONTRAST; CT ABDOMEN AND PELVIS WITHOUT CONTRAST  TECHNIQUE: Multidetector CT imaging of the chest was performed following the standard protocol without IV contrast.; Multidetector CT imaging of the abdomen and pelvis was performed following the standard protocol without intravenous contrast.  COMPARISON:  CT 02/15/2013.  FINDINGS: CT chest:  Dialysis catheter noted. Is tip is projected into the right ventricle. Shotty mediastinal lymph nodes noted. Thoracic esophagus is unremarkable.  Thoracic aorta normal caliber. Cardiomegaly. Coronary artery disease. No prominent pericardial effusion.  Large airways are patent. Basilar atelectasis. Mild pneumonia in the lung bases cannot be completely excluded. No pleural effusion or pneumothorax.  Thyroid unremarkable. Supraclavicular and axillary regions are  unremarkable. Diffuse anasarca noted of the chest wall.  CT abdomen and pelvis:  No focal hepatic abnormality. The spleen is normal. The pancreas is normal. No significant biliary distention.  Adrenals are unremarkable. No hydronephrosis. No obstructing ureteral stone. The bladder is nondistended. Hysterectomy. No free pelvic fluid collections.  No significant adenopathy.  Aorta normal caliber.  Appendiceal region normal. No inflammatory change in right or left lower quadrant. Stool is noted throughout the colon. Sliding hiatal hernia.  Diffuse mild anasarca noted of the abdominal wall. Small umbilical hernia with herniation of fat only. Diffuse degenerative change thoracolumbar spine and both hips.  IMPRESSION: 1. Dialysis catheter noted, its tip is projected into the right ventricle. 2. Coronary artery disease.  Cardiomegaly. 3. Mild basilar atelectasis. Mild pneumonia lung bases cannot be excluded. This report was phoned to PA Ivonne Andrew at 8:52 p.m. on 06/03/2013.   Electronically Signed   By: Maisie Fus  Register   On: 06/03/2013 20:53   Dg Chest Portable 1 View  06/03/2013   CLINICAL DATA:  Chest pain and cough.  EXAM: PORTABLE CHEST - 1 VIEW  COMPARISON:  03/15/2013  FINDINGS: Mild cardiac enlargement. There is a right chest wall dialysis catheter with tips in the right atrium. No pleural effusion or edema identified. No airspace consolidation noted.  IMPRESSION: No active disease.   Electronically Signed   By: Signa Kell M.D.   On: 06/03/2013 19:08    Other results: EKG: Sinus rhythm; right axis deviation; anteroseptal infarct, old; baseline wander in lead(s) V6; prior EKGs low voltage, no new ischemia   Assessment & Plan by Problem:  1.  Substernal and epigastric pain. The etiology of the chest pain is unclear; given her episodic nausea and vomiting over the past 2 weeks, epigastric pain and tenderness, and history of antral gastric ulcers, an upper GI source is likely.  There is no evidence  of acute coronary syndrome by initial cardiac enzymes or EKG.  Today her chest pain has resolved completely.  Plan is monitor; rule out MI with serial enzymes and EKGs; IV PPI; antiemetic as needed; further evaluation if pain recurs; try to obtain outside records of her prior cardiac workup.   2.  Severe hypertension.  Blood pressure has improved following institution of home medications; plan is to follow blood pressure and adjust regimen as indicated.  3.  Stage IV chronic kidney disease.  Patient had a dialysis catheter placed during an inpatient hospitalization  in October of 2014, and also had an a left arm AV fistula placed at that time.  She apparently underwent two hemodialysis sessions during that hospitalization, but has not required hemodialysis since.  She saw Dr. Arrie Aran on 05/19/2013, and at that time he planned to remove the dialysis catheter and refer her to vascular surgery for evaluation of her AV fistula.  The catheter is out of position with the tip in the left ventricle by abdominal CT scan, and needs to be either repositioned or removed by IR.  I do not see an indication for dialysis at this time, and unless nephrology feels differently, catheter removal would be appropriate.  Would also ask vascular surgery to see patient and evaluate her dialysis access while here.  4.  Other problems and plans as per the resident physician's note.

## 2013-06-04 NOTE — Progress Notes (Signed)
Pt tol ambulating in hallway with stand by assist without c/o of CP, SOB, Dizziness

## 2013-06-04 NOTE — Progress Notes (Signed)
Patient ID: Madeline [name], female   DOB: 07/01/1955, 58 y.o.   MRN: [id number] I was asked to see this 58 year old patient for removal of her right IJ hemodialysis catheter. This was placed in the same setting of an upper arm fistula on 03/03/2013. She has not used her catheter for several months. She was admitted with some chest discomfort may be related to gastric ulcers. She is taking for discharge today. We were consulted for removal of her catheter.  Past Medical History  Diagnosis Date  . CHF (congestive heart failure)     a. HFpEF with RHF/anasarca/pHTN.  . COPD (chronic obstructive pulmonary disease)   . Coronary artery disease     a. Per [facility] records: NSTEMI 01/2012, tx medically given ARF but suspected CAD. b. Stress test 12/16/11 reported w/o ischemia.  . Hypertension   . Diabetes mellitus   . OSA (obstructive sleep apnea)     a. Pt reported used to use CPAP in [place], but "[facility]" [facility]  [facility] Kitchen. CKD (chronic kidney disease), stage III     a. Per [facility] records: h/o ARF after CTA that ruled out PE.  Marland Kitchen. Pulmonary hypertension     a. RHC 02/28/13: mod pulm HTN with normal PVR suggestive of predominantly pulmonary venous HTN.  . Right heart failure   . Anasarca     a. Per [facility] records - due to pulm HTN with R HF.   Marland Kitchen. Aseptic meningitis     a. 09/2012: adm in [place] for metabolic encephalopathy, oliguric tubular necrosis, anemia, HTN, possible CVA, HHNKA.  Marland Kitchen. CVA (cerebral infarction)     a. Per [facility] records, "possible CVA" 09/2012 but MRI reportedly negative.  . Abnormal Doppler ultrasound of carotid artery     a. Per  records: <50% LICA.    History  Substance Use Topics  . Smoking status: Former Games developermoker  . Smokeless tobacco: Current User    Types: Snuff, Chew  . Alcohol Use: No    Family History  Problem Relation Age of Onset  . Diabetes Mellitus II Sister   . Diabetes Mellitus II Brother   . CAD Brother     No Known Allergies  Current facility-administered medications:albuterol  (PROVENTIL) (2.5 MG/3ML) 0.083% nebulizer solution 2.5 mg, 2.5 mg, Inhalation, Q6H PRN, Christen BameNora Sadek, MD;  amLODipine (NORVASC) tablet 10 mg, 10 mg, Oral, Daily, Christen BameNora Sadek, MD, 10 mg at 06/04/13 1021;  aspirin EC tablet 81 mg, 81 mg, Oral, Daily, Christen BameNora Sadek, MD, 81 mg at 06/04/13 1019;  carvedilol (COREG) tablet 25 mg, 25 mg, Oral, BID WC, Dagmar HaitWilliam Blair Walden, MD, 25 mg at 06/03/13 2034 carvedilol (COREG) tablet 25 mg, 25 mg, Oral, BID WC, Christen BameNora Sadek, MD, 25 mg at 06/04/13 [id number];  cloNIDine (CATAPRES) tablet 0.1 mg, 0.1 mg, Oral, Daily, Inez CatalinaEmily B Mullen, MD, 0.1 mg at 06/04/13 1021;  cloNIDine (CATAPRES) tablet 0.2 mg, 0.2 mg, Oral, QHS, Inez CatalinaEmily B Mullen, MD, 0.2 mg at 06/04/13 0013;  ferrous sulfate tablet 325 mg, 325 mg, Oral, Q breakfast, Christen BameNora Sadek, MD, 325 mg at 06/04/13 0856 furosemide (LASIX) tablet 80 mg, 80 mg, Oral, BID, Christen BameNora Sadek, MD, 80 mg at 06/04/13 0856;  gabapentin (NEURONTIN) capsule 300 mg, 300 mg, Oral, BID, Christen BameNora Sadek, MD, 300 mg at 06/04/13 1019;  heparin injection 5,000 Units, 5,000 Units, Subcutaneous, Q8H, Christen BameNora Sadek, MD, 5,000 Units at 06/04/13 0513;  hydrALAZINE (APRESOLINE) tablet 100 mg, 100 mg, Oral, Q8H, Christen BameNora Sadek, MD, 100 mg at 06/04/13 [id number] insulin aspart (  novoLOG) injection 0-9 Units, 0-9 Units, Subcutaneous, TID WC, Christen Bame, MD, 7 Units at 06/04/13 0857;  insulin glargine (LANTUS) injection 10 Units, 10 Units, Subcutaneous, QHS, Christen Bame, MD, 10 Units at 06/04/13 0013;  isosorbide mononitrate (IMDUR) 24 hr tablet 120 mg, 120 mg, Oral, Daily, Christen Bame, MD, 120 mg at 06/04/13 1022;  metolazone (ZAROXOLYN) tablet 5 mg, 5 mg, Oral, BID, Christen Bame, MD, 5 mg at 06/04/13 0856 morphine 2 MG/ML injection 2 mg, 2 mg, Intravenous, Q3H PRN, Christen Bame, MD, 2 mg at 06/04/13 0513;  nitroGLYCERIN (NITROSTAT) SL tablet 0.4 mg, 0.4 mg, Sublingual, Q5 min PRN, Dagmar Hait, MD;  pantoprazole (PROTONIX) injection 40 mg, 40 mg, Intravenous, Q12H, Christen Bame, MD, 40 mg at 06/04/13 1019;   polyethylene glycol (MIRALAX / GLYCOLAX) packet 17 g, 17 g, Oral, Daily PRN, Christen Bame, MD promethazine (PHENERGAN) tablet 25 mg, 25 mg, Oral, Q6H PRN, Christen Bame, MD;  sodium chloride 0.9 % injection 3 mL, 3 mL, Intravenous, Q12H, Christen Bame, MD, 3 mL at 06/04/13 1022  BP 158/72  Pulse 76  Temp(Src) 98.4 F (36.9 C) (Oral)  Resp 16  Ht 4\' 9"  (1.448 m)  SpO2 100%  There is no weight on file to calculate BMI.   Does go exam thin well-developed female in no acute distress. Neurologically she is grossly intact. Her left upper arm fistula very good maturation at the antecubital space and the vein is smaller in the upper arm. Her right IJ catheter shows no evidence of infection.  Impression and plan chronic renal insufficiency with no need for catheter. Agree with removal. The patient is scheduled to see [place] in the office with ultrasound of her fistula for further evaluation encourage her to keep this ointment.  The patient had explanation of removal of the catheter. She had informed consent signed. Under local anesthesia at the bedside she had removal of her catheter with no complication.

## 2013-06-05 ENCOUNTER — Telehealth: Payer: Self-pay | Admitting: Vascular Surgery

## 2013-06-05 LAB — CBC
HEMATOCRIT: 29.6 % — AB (ref 36.0–46.0)
Hemoglobin: 9.6 g/dL — ABNORMAL LOW (ref 12.0–15.0)
MCH: 29.5 pg (ref 26.0–34.0)
MCHC: 32.4 g/dL (ref 30.0–36.0)
MCV: 91.1 fL (ref 78.0–100.0)
Platelets: 173 10*3/uL (ref 150–400)
RBC: 3.25 MIL/uL — ABNORMAL LOW (ref 3.87–5.11)
RDW: 14.1 % (ref 11.5–15.5)
WBC: 8.4 10*3/uL (ref 4.0–10.5)

## 2013-06-05 LAB — RAPID URINE DRUG SCREEN, HOSP PERFORMED
Amphetamines: NOT DETECTED
Barbiturates: NOT DETECTED
Benzodiazepines: NOT DETECTED
COCAINE: NOT DETECTED
Opiates: POSITIVE — AB
Tetrahydrocannabinol: NOT DETECTED

## 2013-06-05 LAB — URINALYSIS, ROUTINE W REFLEX MICROSCOPIC
Bilirubin Urine: NEGATIVE
Glucose, UA: 250 mg/dL — AB
Hgb urine dipstick: NEGATIVE
Ketones, ur: NEGATIVE mg/dL
NITRITE: NEGATIVE
Protein, ur: >300 mg/dL — AB
SPECIFIC GRAVITY, URINE: 1.017 (ref 1.005–1.030)
UROBILINOGEN UA: 0.2 mg/dL (ref 0.0–1.0)
pH: 5 (ref 5.0–8.0)

## 2013-06-05 LAB — URINE MICROSCOPIC-ADD ON

## 2013-06-05 LAB — GLUCOSE, CAPILLARY
GLUCOSE-CAPILLARY: 413 mg/dL — AB (ref 70–99)
Glucose-Capillary: 119 mg/dL — ABNORMAL HIGH (ref 70–99)

## 2013-06-05 MED ORDER — PANTOPRAZOLE SODIUM 40 MG PO TBEC: 40.0000 mg | DELAYED_RELEASE_TABLET | Freq: Two times a day (BID) | ORAL | Status: AC

## 2013-06-05 NOTE — Discharge Instructions (Signed)

## 2013-06-05 NOTE — Clinical Documentation Improvement (Signed)
THIS DOCUMENT IS NOT A PERMANENT PART OF THE MEDICAL RECORD  Please update your documentation with the medical record to reflect your response to this query. If you need help knowing how to do this please call [phone].           06/05/13  Dear Dr. Darci Needlehikowski,  In an effort to better capture your patient's severity of illness, reflect appropriate length of stay and utilization of resources, a review of the patient medical record has revealed the following indicators.   Based on your clinical judgment, please clarify and document in a progress note and/or discharge summary the clinical condition associated with the following supporting information: In responding to this query please exercise your independent judgment.  The fact that a query is asked, does not imply that any particular answer is desired or expected.   Hello Dr. Darci Needlehikowski!  Chest/Epigastric Pain is considered non specific. Based on your clinical judgment, please clarify cause of Chest/Epigastric Pain:  Possible Clinical Conditions?  - PUD  - Medication (Protoix) Non-Compliance and PUD  - Other condition (please document in the progress notes and/or discharge summary)  It is unclear what this pain was from. I suspect it is from a GI related issue of which PUD is a possibility, though I cannot confirm this as we did not perform EGD this admission.   Reviewed: additional documentation in the medical record  Thank You,  Saul FordyceSalena A Morgan  Clinical Documentation Specialist: [phone] Health Information Management [place]

## 2013-06-05 NOTE — Discharge Summary (Signed)
Name: Madeline [name] MRN: [id number] DOB: 13-Jun-1955 58 y.o. PCP: Madeline BannerFred H Wilson, MD  Date of Admission: 06/03/2013  5:46 PM Date of Discharge: 06/05/2013 Attending Physician: Dr. Meredith [name]  Discharge Diagnosis: Principal Problem:   Chest pain- likely GI related, perhaps PUD Active Problems:   DM (diabetes mellitus)   HTN (hypertension), malignant   Chronic kidney disease (CKD), stage IV (severe)   OSA on CPAP   Chronic diastolic heart failure   PUD (peptic ulcer disease)  Discharge Medications:   Medication List    STOP taking these medications       omeprazole 40 MG capsule  Commonly known as:  PRILOSEC      TAKE these medications       acetaminophen 500 MG tablet  Commonly known as:  TYLENOL  Take 500-1,000 mg by mouth every 6 (six) hours as needed for moderate pain.     albuterol 108 (90 BASE) MCG/ACT inhaler  Commonly known as:  PROVENTIL HFA;VENTOLIN HFA  Inhale 2 puffs into the lungs every 6 (six) hours as needed for wheezing or shortness of breath.     amLODipine 10 MG tablet  Commonly known as:  NORVASC  Take 10 mg by mouth daily.     aspirin EC 81 MG tablet  Take 81 mg by mouth daily.     atorvastatin 40 MG tablet  Commonly known as:  LIPITOR  Take 40 mg by mouth daily.     carvedilol 25 MG tablet  Commonly known as:  COREG  Take 25 mg by mouth 2 (two) times daily with a meal.     cloNIDine 0.1 MG tablet  Commonly known as:  CATAPRES  Take 0.1-0.2 mg by mouth 2 (two) times daily. Takes 0.1mg  every morning and 0.2mg  at bedtime     ferrous sulfate 325 (65 FE) MG tablet  Take 325 mg by mouth daily with breakfast.     furosemide 80 MG tablet  Commonly known as:  LASIX  Take 80 mg by mouth 2 (two) times daily.     gabapentin 300 MG capsule  Commonly known as:  NEURONTIN  Take 300 mg by mouth 2 (two) times daily.     hydrALAZINE 100 MG tablet  Commonly known as:  APRESOLINE  Take 100 mg by mouth 3 (three) times daily.     insulin glargine 100  UNIT/ML injection  Commonly known as:  LANTUS  Inject 10 Units into the skin at bedtime.     insulin lispro 100 UNIT/ML injection  Commonly known as:  HUMALOG  Inject 3-10 Units into the skin 3 (three) times daily before meals. Sliding scale.  CBG 200: 3 UNITS, 250 5 UNITS, 300 7 UNITS, 350 9-10 UNITS     isosorbide mononitrate 120 MG 24 hr tablet  Commonly known as:  IMDUR  Take 120 mg by mouth daily.     metolazone 5 MG tablet  Commonly known as:  ZAROXOLYN  Take 5 mg by mouth 2 (two) times daily.     nitroGLYCERIN 0.4 MG SL tablet  Commonly known as:  NITROSTAT  Place 0.4 mg under the tongue every 5 (five) minutes as needed for chest pain.     pantoprazole 40 MG tablet  Commonly known as:  PROTONIX  Take 1 tablet (40 mg total) by mouth 2 (two) times daily.     polyethylene glycol packet  Commonly known as:  MIRALAX / GLYCOLAX  Take 17 g by mouth daily as needed (for constipation).  promethazine 25 MG tablet  Commonly known as:  PHENERGAN  Take 25 mg by mouth every 6 (six) hours as needed for nausea or vomiting.     traMADol 50 MG tablet  Commonly known as:  ULTRAM  Take 50 mg by mouth 3 (three) times daily as needed for pain.        Disposition and follow-up:   [name] was discharged from [facility] in Stable condition.  At the hospital follow up visit please address:  1.  PUD- please evaluate for symptom resolution 2.  CKD stage IV- patient is nearing the point of needing HD; she has f/u with Dr. Imogene [name] with vascular surgery to assess her AV fistula as well as f/u with nephrology, please make sure patient is following up   2.  Labs / imaging needed at time of follow-up: none  3.  Pending labs/ test needing follow-up: none  Follow-up Appointments: Follow-up Information   Follow up with Madeline Hoit, MD On 06/14/2013. (1pm)    Specialty:  [facility]   Contact information:   [address] [place] [address] [place]  [address] [phone]       Follow up with [facility] PA-C On 06/21/2013. (nephrology, 9:45am)    Specialty:  Nephrology   Contact information:   [address] [place] [address] [phone]       Discharge Instructions: Discharge Orders   Future Appointments Provider Department Dept Phone   06/09/2013 2:30 PM [facility]  CARDIOVASCULAR [address] [phone]   06/09/2013 3:30 PM Fransisco Hertz, MD [facility] [phone]   06/26/2013 8:30 AM [facility] Injection [facility] [phone]   Future Orders Complete By Expires   Call MD for:  extreme fatigue  As directed    Call MD for:  persistant dizziness or light-headedness  As directed    Call MD for:  persistant nausea and vomiting  As directed    Call MD for:  severe uncontrolled pain  As directed    Diet - low sodium heart healthy  As directed    Increase activity slowly  As directed       Consultations:  none  Procedures Performed:  Ct Abdomen Pelvis Wo Contrast  06/03/2013   CLINICAL DATA:  Right-sided chest pain.  Vomiting.  Dialysis.  EXAM: CT CHEST WITHOUT CONTRAST; CT ABDOMEN AND PELVIS WITHOUT CONTRAST  TECHNIQUE: Multidetector CT imaging of the chest was performed following the standard protocol without IV contrast.; Multidetector CT imaging of the abdomen and pelvis was performed following the standard protocol without intravenous contrast.  COMPARISON:  CT 02/15/2013.  FINDINGS: CT chest:  Dialysis catheter noted. Is tip is projected into the right ventricle. Shotty mediastinal lymph nodes noted. Thoracic esophagus is unremarkable.  Thoracic aorta normal caliber. Cardiomegaly. Coronary artery disease. No prominent pericardial effusion.  Large airways are patent. Basilar atelectasis. Mild pneumonia in the lung bases cannot be completely excluded. No pleural effusion or pneumothorax.  Thyroid unremarkable. Supraclavicular and  axillary regions are unremarkable. Diffuse anasarca noted of the chest wall.  CT abdomen and pelvis:  No focal hepatic abnormality. The spleen is normal. The pancreas is normal. No significant biliary distention.  Adrenals are unremarkable. No hydronephrosis. No obstructing ureteral stone. The bladder is nondistended. Hysterectomy. No free pelvic fluid collections.  No significant adenopathy.  Aorta normal caliber.  Appendiceal region normal. No inflammatory change in right or left lower quadrant. Stool  is noted throughout the colon. Sliding hiatal hernia.  Diffuse mild anasarca noted of the abdominal wall. Small umbilical hernia with herniation of fat only. Diffuse degenerative change thoracolumbar spine and both hips.  IMPRESSION: 1. Dialysis catheter noted, its tip is projected into the right ventricle. 2. Coronary artery disease.  Cardiomegaly. 3. Mild basilar atelectasis. Mild pneumonia lung bases cannot be excluded. This report was phoned to PA Ivonne Andrew at 8:52 p.m. on 06/03/2013.   Electronically Signed   By: Maisie Fus  Register   On: 06/03/2013 20:53   Ct Chest Wo Contrast  06/03/2013   CLINICAL DATA:  Right-sided chest pain.  Vomiting.  Dialysis.  EXAM: CT CHEST WITHOUT CONTRAST; CT ABDOMEN AND PELVIS WITHOUT CONTRAST  TECHNIQUE: Multidetector CT imaging of the chest was performed following the standard protocol without IV contrast.; Multidetector CT imaging of the abdomen and pelvis was performed following the standard protocol without intravenous contrast.  COMPARISON:  CT 02/15/2013.  FINDINGS: CT chest:  Dialysis catheter noted. Is tip is projected into the right ventricle. Shotty mediastinal lymph nodes noted. Thoracic esophagus is unremarkable.  Thoracic aorta normal caliber. Cardiomegaly. Coronary artery disease. No prominent pericardial effusion.  Large airways are patent. Basilar atelectasis. Mild pneumonia in the lung bases cannot be completely excluded. No pleural effusion or pneumothorax.   Thyroid unremarkable. Supraclavicular and axillary regions are unremarkable. Diffuse anasarca noted of the chest wall.  CT abdomen and pelvis:  No focal hepatic abnormality. The spleen is normal. The pancreas is normal. No significant biliary distention.  Adrenals are unremarkable. No hydronephrosis. No obstructing ureteral stone. The bladder is nondistended. Hysterectomy. No free pelvic fluid collections.  No significant adenopathy.  Aorta normal caliber.  Appendiceal region normal. No inflammatory change in right or left lower quadrant. Stool is noted throughout the colon. Sliding hiatal hernia.  Diffuse mild anasarca noted of the abdominal wall. Small umbilical hernia with herniation of fat only. Diffuse degenerative change thoracolumbar spine and both hips.  IMPRESSION: 1. Dialysis catheter noted, its tip is projected into the right ventricle. 2. Coronary artery disease.  Cardiomegaly. 3. Mild basilar atelectasis. Mild pneumonia lung bases cannot be excluded. This report was phoned to PA Ivonne Andrew at 8:52 p.m. on 06/03/2013.   Electronically Signed   By: Maisie Fus  Register   On: 06/03/2013 20:53   Dg Chest Portable 1 View  06/03/2013   CLINICAL DATA:  Chest pain and cough.  EXAM: PORTABLE CHEST - 1 VIEW  COMPARISON:  03/15/2013  FINDINGS: Mild cardiac enlargement. There is a right chest wall dialysis catheter with tips in the right atrium. No pleural effusion or edema identified. No airspace consolidation noted.  IMPRESSION: No active disease.   Electronically Signed   By: Signa Kell M.D.   On: 06/03/2013 19:08   Admission HPI:  Pt is a 58 y.o. female with a PMH of COPD, poorly controlled HTN, DM, OSA, CKDstageIV, Pulmonary HTN, and CVA. The history provided by the patient was extremely limited due to pt being in severe pain and unable to cooperate with the interview. She presents to the ED with N/V and chest pain that began this AM. She states the pain is located mainly in the epigastric region. She  reports having similar pain in the past and was found to have PUD. She had several episodes of NBNB emesis today. She denied any fever/chills, SOB, or cough. She denies any NSAID or alcohol use.  Of note, she was d/c from [facility] on [facility]13 with acute respiratory failure  due to flash pulmonary edema from CKD. ECHO 02/15/13 LVEF 65% RV dilated. Lasix drip started but ultimately required HD. AVF placed. Had acute blood loss from catheter and received 3UPRBCs. EGD at that time revealed multiple focal gastric ulcers.  She was again d/c on 03/18/13 for UGI bleed. GI consulted but recommended treating PUD with PPI as long as hgb stable. Recommended capsule endoscopy if hgb drops again with documented heme positive stools.  Today, he was hypertensive in the ED with BP 220s/80s and was given norvasc 10mg , coreg 25mg , dilaudid, zofran, and GI cocktail.   Hospital Course by problem list:   # Epigastric Pain- Patient with epigastric pain, nausea and vomiting on admission. Patient reported that she took both protonix and omeprazole since 05/19/2013. Patient likely taking too much PPI and this may be the cause of her symptoms. She had stopped taking protonix and had been taking omeprazole when she was discharged from the hospital in Oct 2014. She does have hx of PUD, which may also be the cause of her symptoms. I do not believe her initial chest pain that she presented with was cardiac in origin, ACS ruled out with three negative troponins. It is likely related to her GI issue. She was managed on protonix 40mg  IV BID during her hospitalization. She was discharged with protonix 40mg  BID. She was also managed on phenergan w/ good control of her nausea. At discharge, patient had been symptom-free for over 24 hours. She was tolerating PO well.  # Uncontrolled hypertension- BP 220s/80s on admission, likely 2/2 medication noncompliance. BP improved with restarting patient's home regimen:  norvasc 10mg  daily, lasix 80mg  BID,  metolazone 5mg  BID, clonidine .1mg  qAM and .2mg  qPM, coreg 25mg  IBD, hydralazine 100mg  TID. BP well controlled at time of discharge. She was discharged on her home regimen. She has close f/u with her PCP.  #CKD stage IV - Cr at baseline, Cr 3.06-->3.21 during this admission (B/L 2.5-3.5). Pt had AV fistula placed on 10/26 in preparation for HD during her admission for acute volume overload (thought to be 2/2 CKD). Patient was dialyzed per temporary R sided PC twice during this admission in October, though has not been dialyzed since. She has an appointment with Dr. Imogene [name] with [facility] on 1/30 for reevaluation of L arm AVF. I spoke with Dr. Darrick Penna with nephrology and he agreed that it was OK to pull out the perm cath. Dr. Arbie Cookey with [facility] pulled out PC on 1/25. Patient has f/u with both [facility] and nephrology.   #Chronic diastolic HF - In Oct 2014, TTE showed LVEF 65% with LVH and bilaterally dilated atrium and diastolic filling impairment. Pt stable and euvolemic during this admission. Continued current meds: BP meds above as well as IMDUR 120mg  daily.  #DMII- HgbA1c 7.7 on 10/14. Patient is on lantus 10U qHS and lispro 3-10u per sliding scale at home. We managed patient on SSI as well as continued her lantus 10U qHS. CBGs elevated overnight and ranged 119-413. Continued her home regimen at discharge.  Discharge Vitals:   BP 160/67  Pulse 62  Temp(Src) 98.9 F (37.2 C) (Oral)  Resp 16  Ht 4\' 9"  (1.448 m)  SpO2 100%  Discharge Labs:  Results for orders placed during the hospital encounter of 06/03/13 (from the past 24 hour(s))  GLUCOSE, CAPILLARY     Status: Abnormal   Collection Time    06/04/13  5:11 PM      Result Value Range   Glucose-Capillary 339 (*) 70 -  99 mg/dL   Comment 1 Notify RN    GLUCOSE, CAPILLARY     Status: Abnormal   Collection Time    06/04/13  7:58 PM      Result Value Range   Glucose-Capillary 231 (*) 70 - 99 mg/dL  CBC     Status: Abnormal   Collection Time    06/05/13   4:35 AM      Result Value Range   WBC 8.4  4.0 - 10.5 K/uL   RBC 3.25 (*) 3.87 - 5.11 MIL/uL   Hemoglobin 9.6 (*) 12.0 - 15.0 g/dL   HCT 16.1 (*) 09.6 - 04.5 %   MCV 91.1  78.0 - 100.0 fL   MCH 29.5  26.0 - 34.0 pg   MCHC 32.4  30.0 - 36.0 g/dL   RDW 40.9  81.1 - 91.4 %   Platelets 173  150 - 400 K/uL  URINALYSIS, ROUTINE W REFLEX MICROSCOPIC     Status: Abnormal   Collection Time    06/05/13  4:42 AM      Result Value Range   Color, Urine YELLOW  YELLOW   APPearance HAZY (*) CLEAR   Specific Gravity, Urine 1.017  1.005 - 1.030   pH 5.0  5.0 - 8.0   Glucose, UA 250 (*) NEGATIVE mg/dL   Hgb urine dipstick NEGATIVE  NEGATIVE   Bilirubin Urine NEGATIVE  NEGATIVE   Ketones, ur NEGATIVE  NEGATIVE mg/dL   Protein, ur >782 (*) NEGATIVE mg/dL   Urobilinogen, UA 0.2  0.0 - 1.0 mg/dL   Nitrite NEGATIVE  NEGATIVE   Leukocytes, UA SMALL (*) NEGATIVE  URINE RAPID DRUG SCREEN (HOSP PERFORMED)     Status: Abnormal   Collection Time    06/05/13  4:42 AM      Result Value Range   Opiates POSITIVE (*) NONE DETECTED   Cocaine NONE DETECTED  NONE DETECTED   Benzodiazepines NONE DETECTED  NONE DETECTED   Amphetamines NONE DETECTED  NONE DETECTED   Tetrahydrocannabinol NONE DETECTED  NONE DETECTED   Barbiturates NONE DETECTED  NONE DETECTED  URINE MICROSCOPIC-ADD ON     Status: Abnormal   Collection Time    06/05/13  4:42 AM      Result Value Range   Squamous Epithelial / LPF RARE  RARE   WBC, UA 0-2  <3 WBC/hpf   RBC / HPF 0-2  <3 RBC/hpf   Casts HYALINE CASTS (*) NEGATIVE  GLUCOSE, CAPILLARY     Status: Abnormal   Collection Time    06/05/13  7:37 AM      Result Value Range   Glucose-Capillary 119 (*) 70 - 99 mg/dL  GLUCOSE, CAPILLARY     Status: Abnormal   Collection Time    06/05/13 11:44 AM      Result Value Range   Glucose-Capillary 413 (*) 70 - 99 mg/dL    Signed: Windell Hummingbird, MD 06/05/2013, 5:05 PM   Time Spent on Discharge: 35 minutes Services Ordered on  Discharge: none Equipment Ordered on Discharge: none

## 2013-06-05 NOTE — Progress Notes (Signed)
Subjective: Patient feels well this AM. No more nausea/vomiting/epigastric pain. Patient reports that she had been taking both omeprazole and protonix twice daily since 1/9.  She walked with the nurse and did well, no recurrence of chest pain.  Objective: Vital signs in last 24 hours: Filed Vitals:   06/04/13 1430 06/04/13 2028 06/05/13 0000 06/05/13 0535  BP: 109/51 100/56 122/55 160/67  Pulse: 63 64 59 62  Temp: 99 F (37.2 C) 98.1 F (36.7 C) 98.9 F (37.2 C) 98.9 F (37.2 C)  TempSrc: Oral  Oral   Resp: 18 15 15 16   Height:      SpO2: 98% 100% 100% 100%   Weight change:   Intake/Output Summary (Last 24 hours) at 06/05/13 0755 Last data filed at 06/04/13 2211  Gross per 24 hour  Intake    606 ml  Output    300 ml  Net    306 ml   Physical Exam General: alert, cooperative, and in no apparent distress HEENT: pupils equal round and reactive to light, vision grossly intact, oropharynx clear and non-erythematous  Neck: supple Lungs: clear to ascultation bilaterally, normal work of respiration, no wheezes, rales, ronchi Heart: regular rate and rhythm, no murmurs, gallops, or rubs Abdomen: soft, non tender, non-distended, normal bowel sounds Extremities: warm, no pedal edema Neurologic: alert & oriented X3, cranial nerves II-XII grossly intact, strength grossly intact, sensation intact to light touch  Lab Results: Basic Metabolic Panel:  Recent Labs Lab 06/03/13 1803 06/04/13 0602  NA 140 140  K 4.5 5.1  CL 97 99  CO2 18* 19  GLUCOSE 242* 398*  BUN 62* 67*  CREATININE 3.06* 3.21*  CALCIUM 10.1 9.2   Liver Function Tests:  Recent Labs Lab 06/03/13 1803 06/04/13 0602  AST 50* 34  ALT 34 27  ALKPHOS 140* 113  BILITOT 0.4 <0.2*  PROT 8.1 6.9  ALBUMIN 3.6 2.9*    Recent Labs Lab 06/03/13 1803  LIPASE 49   CBC:  Recent Labs Lab 06/04/13 0602 06/05/13 0435  WBC 8.8 8.4  HGB 11.5* 9.6*  HCT 34.5* 29.6*  MCV 90.1 91.1  PLT 205 173   Cardiac  Enzymes:  Recent Labs Lab 06/04/13 0133 06/04/13 0602 06/04/13 1122  TROPONINI <0.30 <0.30 <0.30   CBG:  Recent Labs Lab 06/04/13 0743 06/04/13 1131 06/04/13 1711 06/04/13 1958 06/05/13 0737  GLUCAP 342* 204* 339* 231* 119*   Studies/Results: Ct Abdomen Pelvis Wo Contrast  06/03/2013   CLINICAL DATA:  Right-sided chest pain.  Vomiting.  Dialysis.  EXAM: CT CHEST WITHOUT CONTRAST; CT ABDOMEN AND PELVIS WITHOUT CONTRAST  TECHNIQUE: Multidetector CT imaging of the chest was performed following the standard protocol without IV contrast.; Multidetector CT imaging of the abdomen and pelvis was performed following the standard protocol without intravenous contrast.  COMPARISON:  CT 02/15/2013.  FINDINGS: CT chest:  Dialysis catheter noted. Is tip is projected into the right ventricle. Shotty mediastinal lymph nodes noted. Thoracic esophagus is unremarkable.  Thoracic aorta normal caliber. Cardiomegaly. Coronary artery disease. No prominent pericardial effusion.  Large airways are patent. Basilar atelectasis. Mild pneumonia in the lung bases cannot be completely excluded. No pleural effusion or pneumothorax.  Thyroid unremarkable. Supraclavicular and axillary regions are unremarkable. Diffuse anasarca noted of the chest wall.  CT abdomen and pelvis:  No focal hepatic abnormality. The spleen is normal. The pancreas is normal. No significant biliary distention.  Adrenals are unremarkable. No hydronephrosis. No obstructing ureteral stone. The bladder is nondistended. Hysterectomy. No  free pelvic fluid collections.  No significant adenopathy.  Aorta normal caliber.  Appendiceal region normal. No inflammatory change in right or left lower quadrant. Stool is noted throughout the colon. Sliding hiatal hernia.  Diffuse mild anasarca noted of the abdominal wall. Small umbilical hernia with herniation of fat only. Diffuse degenerative change thoracolumbar spine and both hips.  IMPRESSION: 1. Dialysis catheter  noted, its tip is projected into the right ventricle. 2. Coronary artery disease.  Cardiomegaly. 3. Mild basilar atelectasis. Mild pneumonia lung bases cannot be excluded. This report was phoned to PA Ivonne Andrew at 8:52 p.m. on 06/03/2013.   Electronically Signed   By: Maisie Fus  Register   On: 06/03/2013 20:53   Ct Chest Wo Contrast  06/03/2013   CLINICAL DATA:  Right-sided chest pain.  Vomiting.  Dialysis.  EXAM: CT CHEST WITHOUT CONTRAST; CT ABDOMEN AND PELVIS WITHOUT CONTRAST  TECHNIQUE: Multidetector CT imaging of the chest was performed following the standard protocol without IV contrast.; Multidetector CT imaging of the abdomen and pelvis was performed following the standard protocol without intravenous contrast.  COMPARISON:  CT 02/15/2013.  FINDINGS: CT chest:  Dialysis catheter noted. Is tip is projected into the right ventricle. Shotty mediastinal lymph nodes noted. Thoracic esophagus is unremarkable.  Thoracic aorta normal caliber. Cardiomegaly. Coronary artery disease. No prominent pericardial effusion.  Large airways are patent. Basilar atelectasis. Mild pneumonia in the lung bases cannot be completely excluded. No pleural effusion or pneumothorax.  Thyroid unremarkable. Supraclavicular and axillary regions are unremarkable. Diffuse anasarca noted of the chest wall.  CT abdomen and pelvis:  No focal hepatic abnormality. The spleen is normal. The pancreas is normal. No significant biliary distention.  Adrenals are unremarkable. No hydronephrosis. No obstructing ureteral stone. The bladder is nondistended. Hysterectomy. No free pelvic fluid collections.  No significant adenopathy.  Aorta normal caliber.  Appendiceal region normal. No inflammatory change in right or left lower quadrant. Stool is noted throughout the colon. Sliding hiatal hernia.  Diffuse mild anasarca noted of the abdominal wall. Small umbilical hernia with herniation of fat only. Diffuse degenerative change thoracolumbar spine and both  hips.  IMPRESSION: 1. Dialysis catheter noted, its tip is projected into the right ventricle. 2. Coronary artery disease.  Cardiomegaly. 3. Mild basilar atelectasis. Mild pneumonia lung bases cannot be excluded. This report was phoned to PA Ivonne Andrew at 8:52 p.m. on 06/03/2013.   Electronically Signed   By: Maisie Fus  Register   On: 06/03/2013 20:53   Dg Chest Portable 1 View  06/03/2013   CLINICAL DATA:  Chest pain and cough.  EXAM: PORTABLE CHEST - 1 VIEW  COMPARISON:  03/15/2013  FINDINGS: Mild cardiac enlargement. There is a right chest wall dialysis catheter with tips in the right atrium. No pleural effusion or edema identified. No airspace consolidation noted.  IMPRESSION: No active disease.   Electronically Signed   By: Signa Kell M.D.   On: 06/03/2013 19:08   Medications: I have reviewed the patient's current medications. Scheduled Meds: . amLODipine  10 mg Oral Daily  . aspirin EC  81 mg Oral Daily  . carvedilol  25 mg Oral BID WC  . cloNIDine  0.1 mg Oral Daily   And  . cloNIDine  0.2 mg Oral QHS  . ferrous sulfate  325 mg Oral Q breakfast  . furosemide  80 mg Oral BID  . gabapentin  300 mg Oral BID  . heparin  5,000 Units Subcutaneous Q8H  . hydrALAZINE  100 mg Oral Q8H  .  insulin aspart  0-9 Units Subcutaneous TID WC  . insulin glargine  10 Units Subcutaneous QHS  . isosorbide mononitrate  120 mg Oral Daily  . metolazone  5 mg Oral BID  . pantoprazole (PROTONIX) IV  40 mg Intravenous Q12H  . sodium chloride  3 mL Intravenous Q12H   Continuous Infusions:  PRN Meds:.albuterol, morphine injection, nitroGLYCERIN, polyethylene glycol, promethazine  Assessment/Plan: # Epigastric Pain- Resolved. Will continue protonix upon discharge. -protonix 40mg  IV q12h  -promethazine prn  #Uncontrolled hypertension- BP improved, 120-160/50-60. -continue home meds: norvasc 10mg  daily, lasix 80mg  BID, metolazone 5mg  BID, clonidine .1mg  qAM and .2mg  qPM, coreg 25mg  IBD, hydralazine 100mg   TID  #CKD stage IV - Cr at baseline. Will arrange f/u w/ nephrology. She will go see Dr. Imogene Burn to evaluate her AV fistula on 1/30.  #Chronic diastolic HF - 10/14 TTE: LVEF 16% with LVH and bilaterally dilated atrium and diastolic filling impairment. Pt currently stable and euvolemic. -continue current meds: BP meds above as well as IMDUR 120mg  daily  #DMII- HgbA1c 7.7 on 10/14. Patient is on lantus 10U qHS and lispro 3-10u per sliding scale at home. -SSI-s  -lantus 10U qHS  #VTE prophylaxis- SCDs   Dispo: Discharge today.  The patient does have a current PCP Barbie [facility], MD) and does not need an [facility] hospital follow-up appointment after discharge.  The patient does not have transportation limitations that hinder transportation to clinic appointments.  .Services Needed at time of discharge: Y = Yes, Blank = No PT:   OT:   RN:   Equipment:   Other:     LOS: 2 days   Windell Hummingbird, MD 06/05/2013, 7:55 AM

## 2013-06-05 NOTE — Progress Notes (Signed)
Spoke with Dr. Darci Needlehikowski about pt only being on clear liquid diet. MD states she is aware, pt is doing well, no need to increase diet before discharge. Dr. Darci Needlehikowski stated may give pt crackers prior to d/c. Madeline Brothershristy Tramell Piechota RN

## 2013-06-05 NOTE — Telephone Encounter (Signed)
Message copied by Fredrich BirksMILLIKAN, DANA P on Mon Jun 05, 2013  3:36 PM ------      Message from: Phillips OdorPULLINS, CAROL S      Created: Mon Jun 05, 2013  9:43 AM       Annabelle Harmanana- the office visit is with Dr. Imogene Burnhen on 06/09/13; don't really think this has to be moved to Dr. Arbie CookeyEarly, since they share the care of the dialysis pts. (per [facility] msg, the perm cath was removed on Sunday)             ----- Message -----         From: Larina Earthlyodd F Early, MD         Sent: 06/04/2013  11:18 AM           To: Reuel DerbySusan H Large, Erenest Blankarol Sue Pullins, RN            Low-level consult on Sunday. She needs to see me in the office apparently this is scheduled with evaluation vascular lab of her left upper arm AV fistula.            She had removal of a right IJ hemodialysis catheter by myself today.       ------

## 2013-06-08 ENCOUNTER — Encounter: Payer: Self-pay | Admitting: Vascular Surgery

## 2013-06-09 ENCOUNTER — Encounter: Payer: Self-pay | Admitting: Vascular Surgery

## 2013-06-09 ENCOUNTER — Ambulatory Visit (INDEPENDENT_AMBULATORY_CARE_PROVIDER_SITE_OTHER): Payer: Medicaid - Out of State | Admitting: Vascular Surgery

## 2013-06-09 ENCOUNTER — Ambulatory Visit (HOSPITAL_COMMUNITY)
Admission: RE | Admit: 2013-06-09 | Discharge: 2013-06-09 | Disposition: A | Discharge status: Home / Self Care | Payer: Medicaid Other | Source: Ambulatory Visit | Attending: Vascular Surgery | Admitting: Vascular Surgery

## 2013-06-09 VITALS — BP 147/61 | HR 65 | Resp 16 | Ht 59.0 in | Wt 143.0 lb

## 2013-06-09 DIAGNOSIS — Z992 Dependence on renal dialysis: Secondary | ICD-10-CM | POA: Insufficient documentation

## 2013-06-09 DIAGNOSIS — N184 Chronic kidney disease, stage 4 (severe): Secondary | ICD-10-CM

## 2013-06-09 DIAGNOSIS — T82598A Other mechanical complication of other cardiac and vascular devices and implants, initial encounter: Secondary | ICD-10-CM

## 2013-06-09 NOTE — Progress Notes (Signed)
Postoperative Access Visit   History of Present Illness  Madeline [name] is a 58 y.o. year old female who presents for postoperative follow-up for: L BC AVF (Date: 02/14/13).  The patient's wounds are healed.  The patient notes no steal symptoms.  The patient is able to complete their activities of daily living.  The patient's current symptoms are: questionable maturation of L BC AVF.  She has stopped HD and recently had her TDC removed.   Past Medical History  Diagnosis Date  . CHF (congestive heart failure)     a. HFpEF with RHF/anasarca/pHTN.  . COPD (chronic obstructive pulmonary disease)   . Coronary artery disease     a. Per [place] records: NSTEMI 01/2012, tx medically given ARF but suspected CAD. b. Stress test 12/16/11 reported w/o ischemia.  . Hypertension   . Diabetes mellitus   . OSA (obstructive sleep apnea)     a. Pt reported used to use CPAP in [place], but "ran out" when came to [facility].  Marland Kitchen CKD (chronic kidney disease), stage III     a. Per [facility] [place] records: h/o ARF after CTA that ruled out PE.  Marland Kitchen Pulmonary hypertension     a. [facility] 02/28/13: mod pulm HTN with normal PVR suggestive of predominantly pulmonary venous HTN.  . Right heart failure   . Anasarca     a. Per [place] records - due to pulm HTN with R HF.   Marland Kitchen Aseptic meningitis     a. 09/2012: adm in [place] for metabolic encephalopathy, oliguric tubular necrosis, anemia, HTN, possible CVA, HHNKA.  Marland Kitchen CVA (cerebral infarction)     a. Per [facility] records, "possible CVA" 09/2012 but MRI reportedly negative.  . Abnormal Doppler ultrasound of carotid artery     a. Per [facility] records: <50% LICA.    Past Surgical History  Procedure Laterality Date  . Tubal ligation    . Abdominal hysterectomy    . Esophagogastroduodenoscopy N/A 02/16/2013    Procedure: ESOPHAGOGASTRODUODENOSCOPY (EGD);  Surgeon: Graylin Shiver, MD;  Location: [facility] ENDOSCOPY;  Service: Endoscopy;  Laterality: N/A;  . Insertion of dialysis catheter Right 03/03/2013    Procedure: INSERTION OF  DIALYSIS CATHETER;  Surgeon: Larina Earthly, MD;  Location: [facility] OR;  Service: Vascular;  Laterality: Right;  Right Internal Jugular Placement  . Av fistula placement Left 03/03/2013    Procedure: ARTERIOVENOUS (AV) FISTULA CREATION, Brachial/Cephalic;  Surgeon: Larina Earthly, MD;  [facility];  Service: Vascular;  Laterality: Left;    History   Social History  . Marital Status: Widowed    Spouse Name: N/A    Number of Children: N/A  . Years of Education: N/A   Occupational History  . Not on file.   Social History Main Topics  . Smoking status: Former Games developer  . Smokeless tobacco: Current User    Types: Snuff, Chew  . Alcohol Use: No  . Drug Use: No  . Sexual Activity: No   Other Topics Concern  . Not on file   Social History Narrative  . No narrative on file    Family History  Problem Relation Age of Onset  . Diabetes Mellitus II Sister   . Diabetes Mellitus II Brother   . CAD Brother     Current Outpatient Prescriptions on File Prior to Visit  Medication Sig Dispense Refill  . acetaminophen (TYLENOL) 500 MG tablet Take 500-1,000 mg by mouth every 6 (six) hours as needed for moderate pain.      Marland Kitchen  albuterol (PROVENTIL HFA;VENTOLIN HFA) 108 (90 BASE) MCG/ACT inhaler Inhale 2 puffs into the lungs every 6 (six) hours as needed for wheezing or shortness of breath.       Marland Kitchen. amLODipine (NORVASC) 10 MG tablet Take 10 mg by mouth daily.      Marland Kitchen. aspirin EC 81 MG tablet Take 81 mg by mouth daily.      Marland Kitchen. atorvastatin (LIPITOR) 40 MG tablet Take 40 mg by mouth daily.      . carvedilol (COREG) 25 MG tablet Take 25 mg by mouth 2 (two) times daily with a meal.      . cloNIDine (CATAPRES) 0.1 MG tablet Take 0.1-0.2 mg by mouth 2 (two) times daily. Takes 0.1mg  every morning and 0.2mg  at bedtime      . ferrous sulfate 325 (65 FE) MG tablet Take 325 mg by mouth daily with breakfast.      . furosemide (LASIX) 80 MG tablet Take 80 mg by mouth 2 (two) times daily.      Marland Kitchen. gabapentin (NEURONTIN)  300 MG capsule Take 300 mg by mouth 2 (two) times daily.       . hydrALAZINE (APRESOLINE) 100 MG tablet Take 100 mg by mouth 3 (three) times daily.      . insulin glargine (LANTUS) 100 UNIT/ML injection Inject 10 Units into the skin at bedtime.      . insulin lispro (HUMALOG) 100 UNIT/ML injection Inject 3-10 Units into the skin 3 (three) times daily before meals. Sliding scale.  CBG 200: 3 UNITS, 250 5 UNITS, 300 7 UNITS, 350 9-10 UNITS      . isosorbide mononitrate (IMDUR) 120 MG 24 hr tablet Take 120 mg by mouth daily.      . metolazone (ZAROXOLYN) 5 MG tablet Take 5 mg by mouth 2 (two) times daily.      . nitroGLYCERIN (NITROSTAT) 0.4 MG SL tablet Place 0.4 mg under the tongue every 5 (five) minutes as needed for chest pain.       . pantoprazole (PROTONIX) 40 MG tablet Take 1 tablet (40 mg total) by mouth 2 (two) times daily.  60 tablet  1  . polyethylene glycol (MIRALAX / GLYCOLAX) packet Take 17 g by mouth daily as needed (for constipation).  14 each  0  . promethazine (PHENERGAN) 25 MG tablet Take 25 mg by mouth every 6 (six) hours as needed for nausea or vomiting.      . traMADol (ULTRAM) 50 MG tablet Take 50 mg by mouth 3 (three) times daily as needed for pain.       No current facility-administered medications on file prior to visit.    No Known Allergies  REVIEW OF SYSTEMS:  (Positives checked otherwise negative)  CARDIOVASCULAR:  []  chest pain, []  chest pressure, []  palpitations, []  shortness of breath when laying flat, []  shortness of breath with exertion,  []  pain in feet when walking, []  pain in feet when laying flat, []  history of blood clot in veins (DVT), []  history of phlebitis, []  swelling in legs, []  varicose veins  PULMONARY:  []  productive cough, []  asthma, []  wheezing  NEUROLOGIC:  []  weakness in arms or legs, []  numbness in arms or legs, []  difficulty speaking or slurred speech, []  temporary loss of vision in one eye, []  dizziness  HEMATOLOGIC:  []  bleeding problems,  []  problems with blood clotting too easily  MUSCULOSKEL:  []  joint pain, []  joint swelling  GASTROINTEST:  []  vomiting blood, []  blood in stool  GENITOURINARY:  []  burning with urination, []  blood in urine, [x]  h/o ARF on HD  PSYCHIATRIC:  []  history of major depression  INTEGUMENTARY:  []  rashes, []  ulcers  For VQI Use Only  PRE-ADM LIVING: Home  AMB STATUS: Ambulatory  Physical Examination Filed Vitals:   06/09/13 1523  BP: 147/61  Pulse: 65  Resp: 16   Pulmonary: Sym exp, good air movt, CTAB, no rales, rhonchi, & wheezing  Cardiac: RRR, Nl S1, S2, no Murmurs, rubs or gallops  LUE: Incision is  healed, skin feels , hand grip is 5/5, sensation in digits is  intact, +palpable thrill, bruit can be auscultated   LUE access duplex (06/09/2013)  Diameters: 3.5-7.3 mm  Depth: 1.6-6.2 mm  Stenosis in distal vein: PSV 632 c/s  Medical Decision Making  Madeline [name] is a 58 y.o. year old female who presents s/p L BC AVF with possible distal stenosis in fistula   Recommend: L arm fistulogram, possible intervention.  I suspect the intervention can be done with minimal contrast load.  I discussed with the patient the nature of angiographic procedures, especially the limited patencies of any endovascular intervention.  The patient is aware of that the risks of an angiographic procedure include but are not limited to: bleeding, infection, access site complications, renal failure, embolization, rupture of vessel, dissection, possible need for emergent surgical intervention, possible need for surgical procedures to treat the patient's pathology, anaphylactic reaction to contrast, and stroke and death.    The patient is aware of the risks and agrees to proceed.  Thank you for allowing [place] to participate in this patient's care.  Leonides Sake, MD [facility] Office: [phone] Pager: [phone]  06/09/2013, 3:23 PM

## 2013-06-23 ENCOUNTER — Other Ambulatory Visit (HOSPITAL_COMMUNITY): Payer: Self-pay

## 2013-06-26 ENCOUNTER — Encounter (HOSPITAL_COMMUNITY): Payer: Medicaid Other

## 2013-07-05 ENCOUNTER — Other Ambulatory Visit: Payer: Self-pay

## 2013-07-06 ENCOUNTER — Inpatient Hospital Stay (HOSPITAL_COMMUNITY): Admission: RE | Admit: 2013-07-06 | Payer: Medicaid Other | Source: Ambulatory Visit

## 2013-07-10 ENCOUNTER — Encounter (HOSPITAL_COMMUNITY)
Admission: RE | Admit: 2013-07-10 | Discharge: 2013-07-10 | Disposition: A | Discharge status: Home / Self Care | Payer: Medicaid Other | Source: Ambulatory Visit | Attending: Nephrology | Admitting: Nephrology

## 2013-07-10 VITALS — BP 132/51 | HR 58 | Temp 98.2°F | Resp 20

## 2013-07-10 DIAGNOSIS — N189 Chronic kidney disease, unspecified: Secondary | ICD-10-CM | POA: Insufficient documentation

## 2013-07-10 DIAGNOSIS — N179 Acute kidney failure, unspecified: Secondary | ICD-10-CM | POA: Insufficient documentation

## 2013-07-10 LAB — IRON AND TIBC
IRON: 69 ug/dL (ref 42–135)
Saturation Ratios: 28 % (ref 20–55)
TIBC: 244 ug/dL — ABNORMAL LOW (ref 250–470)
UIBC: 175 ug/dL (ref 125–400)

## 2013-07-10 LAB — COMPREHENSIVE METABOLIC PANEL
ALT: 36 U/L — ABNORMAL HIGH (ref 0–35)
AST: 61 U/L — ABNORMAL HIGH (ref 0–37)
Albumin: 2.7 g/dL — ABNORMAL LOW (ref 3.5–5.2)
Alkaline Phosphatase: 139 U/L — ABNORMAL HIGH (ref 39–117)
BUN: 71 mg/dL — ABNORMAL HIGH (ref 6–23)
CHLORIDE: 99 meq/L (ref 96–112)
CO2: 21 meq/L (ref 19–32)
CREATININE: 3.25 mg/dL — AB (ref 0.50–1.10)
Calcium: 8.3 mg/dL — ABNORMAL LOW (ref 8.4–10.5)
GFR calc Af Amer: 17 mL/min — ABNORMAL LOW (ref >90)
GFR, EST NON AFRICAN AMERICAN: 15 mL/min — AB (ref >90)
Glucose, Bld: 298 mg/dL — ABNORMAL HIGH (ref 70–99)
Potassium: 5.7 mEq/L — ABNORMAL HIGH (ref 3.7–5.3)
Sodium: 135 mEq/L — ABNORMAL LOW (ref 137–147)
Total Protein: 6.2 g/dL (ref 6.0–8.3)

## 2013-07-10 LAB — PHOSPHORUS: PHOSPHORUS: 5.1 mg/dL — AB (ref 2.3–4.6)

## 2013-07-10 LAB — FERRITIN: Ferritin: 531 ng/mL — ABNORMAL HIGH (ref 10–291)

## 2013-07-10 LAB — CBC
HCT: 25.9 % — ABNORMAL LOW (ref 36.0–46.0)
Hemoglobin: 8.9 g/dL — ABNORMAL LOW (ref 12.0–15.0)
MCH: 30.7 pg (ref 26.0–34.0)
MCHC: 34.4 g/dL (ref 30.0–36.0)
MCV: 89.3 fL (ref 78.0–100.0)
PLATELETS: 151 10*3/uL (ref 150–400)
RBC: 2.9 MIL/uL — ABNORMAL LOW (ref 3.87–5.11)
RDW: 13.4 % (ref 11.5–15.5)
WBC: 5.8 10*3/uL (ref 4.0–10.5)

## 2013-07-10 MED ORDER — DARBEPOETIN ALFA-POLYSORBATE 40 MCG/0.4ML IJ SOLN
40.0000 ug | INTRAMUSCULAR | Status: DC
  Administered 2013-07-10: 40 ug via SUBCUTANEOUS

## 2013-07-10 MED ORDER — DARBEPOETIN ALFA-POLYSORBATE 40 MCG/0.4ML IJ SOLN
INTRAMUSCULAR | Status: AC
  Filled 2013-07-10: qty 0.4

## 2013-07-12 MED ORDER — SODIUM CHLORIDE 0.9 % IJ SOLN: 3.0000 mL | INTRAMUSCULAR | Status: DC | PRN

## 2013-07-13 ENCOUNTER — Telehealth: Payer: Self-pay | Admitting: Vascular Surgery

## 2013-07-13 ENCOUNTER — Ambulatory Visit (HOSPITAL_COMMUNITY)
Admission: RE | Admit: 2013-07-13 | Discharge: 2013-07-13 | Disposition: A | Discharge status: Home / Self Care | Payer: Medicaid Other | Source: Ambulatory Visit | Attending: Vascular Surgery | Admitting: Vascular Surgery

## 2013-07-13 ENCOUNTER — Other Ambulatory Visit: Payer: Self-pay | Admitting: *Deleted

## 2013-07-13 ENCOUNTER — Encounter (HOSPITAL_COMMUNITY)
Admission: RE | Disposition: A | Discharge status: Home / Self Care | Payer: Self-pay | Source: Ambulatory Visit | Attending: Vascular Surgery

## 2013-07-13 DIAGNOSIS — Z87891 Personal history of nicotine dependence: Secondary | ICD-10-CM | POA: Insufficient documentation

## 2013-07-13 DIAGNOSIS — I509 Heart failure, unspecified: Secondary | ICD-10-CM | POA: Insufficient documentation

## 2013-07-13 DIAGNOSIS — I129 Hypertensive chronic kidney disease with stage 1 through stage 4 chronic kidney disease, or unspecified chronic kidney disease: Secondary | ICD-10-CM | POA: Insufficient documentation

## 2013-07-13 DIAGNOSIS — N186 End stage renal disease: Secondary | ICD-10-CM

## 2013-07-13 DIAGNOSIS — I252 Old myocardial infarction: Secondary | ICD-10-CM | POA: Insufficient documentation

## 2013-07-13 DIAGNOSIS — E119 Type 2 diabetes mellitus without complications: Secondary | ICD-10-CM | POA: Insufficient documentation

## 2013-07-13 DIAGNOSIS — Z4931 Encounter for adequacy testing for hemodialysis: Secondary | ICD-10-CM

## 2013-07-13 DIAGNOSIS — Z794 Long term (current) use of insulin: Secondary | ICD-10-CM | POA: Insufficient documentation

## 2013-07-13 DIAGNOSIS — Y849 Medical procedure, unspecified as the cause of abnormal reaction of the patient, or of later complication, without mention of misadventure at the time of the procedure: Secondary | ICD-10-CM | POA: Insufficient documentation

## 2013-07-13 DIAGNOSIS — N183 Chronic kidney disease, stage 3 unspecified: Secondary | ICD-10-CM | POA: Insufficient documentation

## 2013-07-13 DIAGNOSIS — Z8673 Personal history of transient ischemic attack (TIA), and cerebral infarction without residual deficits: Secondary | ICD-10-CM | POA: Insufficient documentation

## 2013-07-13 DIAGNOSIS — Z7982 Long term (current) use of aspirin: Secondary | ICD-10-CM | POA: Insufficient documentation

## 2013-07-13 DIAGNOSIS — J4489 Other specified chronic obstructive pulmonary disease: Secondary | ICD-10-CM | POA: Insufficient documentation

## 2013-07-13 DIAGNOSIS — T82898A Other specified complication of vascular prosthetic devices, implants and grafts, initial encounter: Secondary | ICD-10-CM | POA: Insufficient documentation

## 2013-07-13 DIAGNOSIS — I2789 Other specified pulmonary heart diseases: Secondary | ICD-10-CM | POA: Insufficient documentation

## 2013-07-13 DIAGNOSIS — I2510 Atherosclerotic heart disease of native coronary artery without angina pectoris: Secondary | ICD-10-CM | POA: Insufficient documentation

## 2013-07-13 DIAGNOSIS — G4733 Obstructive sleep apnea (adult) (pediatric): Secondary | ICD-10-CM | POA: Insufficient documentation

## 2013-07-13 DIAGNOSIS — J449 Chronic obstructive pulmonary disease, unspecified: Secondary | ICD-10-CM | POA: Insufficient documentation

## 2013-07-13 HISTORY — PX: SHUNTOGRAM: SHX5491

## 2013-07-13 LAB — POCT I-STAT, CHEM 8
BUN: 68 mg/dL — ABNORMAL HIGH (ref 6–23)
CHLORIDE: 101 meq/L (ref 96–112)
Calcium, Ion: 1.13 mmol/L (ref 1.12–1.23)
Creatinine, Ser: 3.3 mg/dL — ABNORMAL HIGH (ref 0.50–1.10)
Glucose, Bld: 285 mg/dL — ABNORMAL HIGH (ref 70–99)
HEMATOCRIT: 28 % — AB (ref 36.0–46.0)
Hemoglobin: 9.5 g/dL — ABNORMAL LOW (ref 12.0–15.0)
POTASSIUM: 4.9 meq/L (ref 3.7–5.3)
Sodium: 135 mEq/L — ABNORMAL LOW (ref 137–147)
TCO2: 22 mmol/L (ref 0–100)

## 2013-07-13 LAB — GLUCOSE, CAPILLARY: Glucose-Capillary: 359 mg/dL — ABNORMAL HIGH (ref 70–99)

## 2013-07-13 SURGERY — ASSESSMENT, SHUNT FUNCTION, WITH CONTRAST RADIOGRAPHIC STUDY
Anesthesia: LOCAL

## 2013-07-13 MED ORDER — HEPARIN SODIUM (PORCINE) 1000 UNIT/ML IJ SOLN
INTRAMUSCULAR | Status: AC
  Filled 2013-07-13: qty 1

## 2013-07-13 MED ORDER — FENTANYL CITRATE 0.05 MG/ML IJ SOLN
INTRAMUSCULAR | Status: AC
  Filled 2013-07-13: qty 2

## 2013-07-13 MED ORDER — LIDOCAINE HCL (PF) 1 % IJ SOLN
INTRAMUSCULAR | Status: AC
Start: 2013-07-13 — End: 2013-07-13
  Filled 2013-07-13: qty 60

## 2013-07-13 MED ORDER — HEPARIN (PORCINE) IN NACL 2-0.9 UNIT/ML-% IJ SOLN
INTRAMUSCULAR | Status: AC
  Filled 2013-07-13: qty 1000

## 2013-07-13 MED ORDER — ACETAMINOPHEN 325 MG PO TABS: 650.0000 mg | ORAL_TABLET | ORAL | Status: DC | PRN

## 2013-07-13 NOTE — Progress Notes (Signed)
Received pt from PV procedure alert and denies any discomfort.  Pt able to tolerate fluids

## 2013-07-13 NOTE — Discharge Instructions (Signed)
AV Fistula °Care After °Refer to this sheet in the next few weeks. These instructions provide you with information on caring for yourself after your procedure. Your caregiver may also give you more specific instructions. Your treatment has been planned according to current medical practices, but problems sometimes occur. Call your caregiver if you have any problems or questions after your procedure. °HOME CARE INSTRUCTIONS  °· Do not drive a car or take public transportation alone. °· Do not drink alcohol. °· Only take medicine that has been prescribed by your caregiver. °· Do not sign important papers or make important decisions. °· Have a responsible person with you. °· Ask your caregiver to show you how to check your access at home for a vibration (called a "thrill") or for a sound (called a "bruit" pronounced brew-ee). °· Your vein will need time to enlarge and mature so needles can be inserted for dialysis. Follow your caregiver's instructions about what you need to do to make this happen. °· Keep dressings clean and dry. °· Keep the arm elevated above your heart. Use a pillow. °· Rest. °· Use the arm as usual for all activities. °· Have the stitches or tape closures removed in 10 to 14 days, or as directed by your caregiver. °· Do not sleep or lie on the area of the fistula or that arm. This may decrease or stop the blood flow through your fistula. °· Do not allow blood pressures to be taken on this arm. °· Do not allow blood drawing to be done from the graft. °· Do not wear tight clothing around the access site or on the arm. °· Avoid lifting heavy objects with the arm that has the fistula. °· Do not use creams or lotions over the access site. °SEEK MEDICAL CARE IF:  °· You have a fever. °· You have swelling around the fistula that gets worse, or you have new pain. °· You have unusual bleeding at the fistula site or from any other area. °· You have pus or other drainage at the fistula site. °· You have skin  redness or red streaking on the skin around, above, or below the fistula site. °· Your access site feels warm. °· You have any flu-like symptoms. °SEEK IMMEDIATE MEDICAL CARE IF:  °· You have pain, numbness, or an unusual pale skin on the hand or on the side of your fistula. °· You have dizziness or weakness that you have not had before. °· You have shortness of breath. °· You have chest pain. °· Your fistula disconnects or breaks, and there is bleeding that cannot be easily controlled. °Call for local emergency medical help. Do not try to drive yourself to the hospital. °MAKE SURE YOU °· Understand these instructions. °· Will watch your condition. °· Will get help right away if you are not doing well or get worse. °Document Released: 04/27/2005 Document Revised: 07/20/2011 Document Reviewed: 10/15/2010 °ExitCare® Patient Information ©2014 [employer]. ° °

## 2013-07-13 NOTE — Telephone Encounter (Signed)
Left arm fistulagram Follow -up 3 months Or der for follow-up (L) access duplex   As per staff message 07-13-13  notified patient of fu appt. on 10-13-13 3 pm with dr. Imogene Burnchen, mailed appt. letter

## 2013-07-13 NOTE — Op Note (Signed)
OPERATIVE NOTE   PROCEDURE: 1.  left brachiocephalic arteriovenous fistula cannulation under ultrasound guidance 2.  left arm fistulogram 3.  Venoplasty of cephalic vein x 4 (4 mm x 40 mm, 6 mm x 60 mm, 6 mm x 20 mm, Angiosculpt 6 mm x 40 mm)  PRE-OPERATIVE DIAGNOSIS: Malfunctioning left brachiocephalic arteriovenous fistula  POST-OPERATIVE DIAGNOSIS: same as above   SURGEON: Adele Barthel, MD  ANESTHESIA: local  ESTIMATED BLOOD LOSS: 5 cc  FINDING(S): 1. Widely patent fistula except for distal segment stenosis (~3 cm): 4.5 mm lumen present after serial venoplasty  SPECIMEN(S):  None  CONTRAST: 30 cc  INDICATIONS: Julene Malek is a 58 y.o. female who presents with malfunctioning left brachiocephalic arteriovenous fistula.  A duplex access demonstrates significant stenosis in distal fistula.  The patient recently has come off dialysis, but the nephrologist want to keep the left brachiocephalic arteriovenous fistula patent in case she becomes end stage renal disease again.  The patient is scheduled for left arm fistulogram, possible intervention.  The patient is aware the risks include but are not limited to: bleeding, infection, thrombosis of the cannulated access, and possible anaphylactic reaction to the contrast.  The patient is aware of the risks of the procedure and elects to proceed forward.  DESCRIPTION: After full informed written consent was obtained, the patient was brought back to the angiography suite and placed supine upon the angiography table.  The patient was connected to monitoring equipment.  The left arm was prepped and draped in the standard fashion for a percutaneous access intervention.  Under ultrasound guidance, the left brachiocephalic arteriovenous fistula was cannulated with a micropuncture needle.  The microwire was advanced into the fistula and the needle was exchanged for the a microsheath, which was lodged 2 cm into the access.  The wire was removed and  the sheath was connected to the IV extension tubing.  Hand injections were completed to image the access from the antecubitum up to the level of axilla.  The central venous structures were also imaged by hand injections.  Based on the images, this patient will need: venoplasty of the left cephalic vein stenosis.  The [facility] wire would not cross the stenosis, so a Glidewire wire was advanced into the axillary vein.  I advanced the sheath pass the stenosis and the wire was exchanged for a [employer] wire.  The sheath was exchanged for a short 6-Fr sheath.  Based on the the imaging, a 4 mm x 40 mm angioplasty balloon was selected.  The balloon was centered around the stenosis and inflated to 10 atm for 2 minutes.  At this point, the balloon was exchanged for a 6 mm x 60 mm angioplasty balloon.  The balloon was centered around the stenosis and inflated to 20 atm for 2 minutes.  There was an area of recalcitrant stenosis.  On completion imaging, a >30% residual stenosis is present.  At this point, the balloon was exchanged for a 6 mm x 20 mm angioplasty balloon.  The balloon was centered around the stenosis and inflated to 24 atm for 2 minutes.  On completion imaging, a >30% residual stenosis was present and the entire segment appeared to be sclerotic.  At this point, the balloon was exchanged for a 6 mm x 40 mm Angiosculpt angioplasty balloon.  The balloon was centered around the stenosis and inflated to 14 atm for 2 minutes.  On completion imaging, a >30% residual stenosis was present.  I replaced the balloon and  inflated it once more centered on the stenosis at 14 atm for 2 minutes.  On the completion imaging, the lumen was 4.5 mm.  I felt futher venoplasty would likely rupture this segment.  The wire and balloon were removed from the sheath.  A 4-0 Monocryl purse-string suture was sewn around the sheath.  The sheath was removed while tying down the suture.  A sterile bandage was applied to the puncture site.  If this  patient's stenosis recurs, I recommend: surgical revision (likely segmental replacement) vs. Ligation of left brachiocephalic arteriovenous fistula.  COMPLICATIONS: none  CONDITION: stable  Adele Barthel, MD [facility] Office: [phone] Pager: [phone]  07/13/2013 9:31 AM

## 2013-07-13 NOTE — H&P (Signed)
Brief History and Physical  History of Present Illness  Madeline [name] is a 58 y.o. female who presents with chief complaint: distal fistula stenosis.  The patient presents today for L arm fistulogram, possible intervention.    Past Medical History  Diagnosis Date  . CHF (congestive heart failure)     a. HFpEF with RHF/anasarca/pHTN.  . COPD (chronic obstructive pulmonary disease)   . Coronary artery disease     a. Per [place] records: NSTEMI 01/2012, tx medically given ARF but suspected CAD. b. Stress test 12/16/11 reported w/o ischemia.  . Hypertension   . Diabetes mellitus   . OSA (obstructive sleep apnea)     a. Pt reported used to use CPAP in [place], but "ran out" when came to [facility].  Marland Kitchen CKD (chronic kidney disease), stage III     a. Per [place] records: h/o ARF after CTA that ruled out PE.  Marland Kitchen Pulmonary hypertension     a. [facility] 02/28/13: mod pulm HTN with normal PVR suggestive of predominantly pulmonary venous HTN.  . Right heart failure   . Anasarca     a. Per [place] records - due to pulm HTN with R HF.   Marland Kitchen Aseptic meningitis     a. 09/2012: adm in [place] for metabolic encephalopathy, oliguric tubular necrosis, anemia, HTN, possible CVA, HHNKA.  Marland Kitchen CVA (cerebral infarction)     a. Per [place] records, "possible CVA" 09/2012 but MRI reportedly negative.  . Abnormal Doppler ultrasound of carotid artery     a. Per [place] records: <50% LICA.    Past Surgical History  Procedure Laterality Date  . Tubal ligation    . Abdominal hysterectomy    . Esophagogastroduodenoscopy N/A 02/16/2013    Procedure: ESOPHAGOGASTRODUODENOSCOPY (EGD);  Surgeon: Graylin Shiver, MD;  Location: [facility] ENDOSCOPY;  Service: Endoscopy;  Laterality: N/A;  . Insertion of dialysis catheter Right 03/03/2013    Procedure: INSERTION OF DIALYSIS CATHETER;  Surgeon: Larina Earthly, MD;  Location: [facility] OR;  Service: Vascular;  Laterality: Right;  Right Internal Jugular Placement  . Av fistula placement Left 03/03/2013    Procedure: ARTERIOVENOUS (AV)  FISTULA CREATION, Brachial/Cephalic;  Surgeon: Larina Earthly, MD;  Location[facility];  Service: Vascular;  Laterality: Left;    History   Social History  . Marital Status: Widowed    Spouse Name: N/A    Number of Children: N/A  . Years of Education: N/A   Occupational History  . Not on file.   Social History Main Topics  . Smoking status: Former Games developer  . Smokeless tobacco: Current User    Types: Snuff, Chew  . Alcohol Use: No  . Drug Use: No  . Sexual Activity: No   Other Topics Concern  . Not on file   Social History Narrative  . No narrative on file    Family History  Problem Relation Age of Onset  . Diabetes Mellitus II Sister   . Diabetes Mellitus II Brother   . CAD Brother     No current facility-administered medications on file prior to encounter.   Current Outpatient Prescriptions on File Prior to Encounter  Medication Sig Dispense Refill  . acetaminophen (TYLENOL) 500 MG tablet Take 500-1,000 mg by mouth every 6 (six) hours as needed for moderate pain.      Marland Kitchen albuterol (PROVENTIL HFA;VENTOLIN HFA) 108 (90 BASE) MCG/ACT inhaler Inhale 2 puffs into the lungs every 6 (six) hours as needed for wheezing or shortness of breath.       Marland Kitchen  amLODipine (NORVASC) 10 MG tablet Take 10 mg by mouth daily.      Marland Kitchen aspirin EC 81 MG tablet Take 81 mg by mouth daily.      Marland Kitchen atorvastatin (LIPITOR) 40 MG tablet Take 40 mg by mouth daily.      . carvedilol (COREG) 25 MG tablet Take 25 mg by mouth 2 (two) times daily with a meal.      . cloNIDine (CATAPRES) 0.1 MG tablet Take 0.1-0.2 mg by mouth 2 (two) times daily. Takes 0.1mg  every morning and 0.2mg  at bedtime      . ferrous sulfate 325 (65 FE) MG tablet Take 325 mg by mouth daily with breakfast.      . furosemide (LASIX) 80 MG tablet Take 80 mg by mouth 2 (two) times daily.      Marland Kitchen gabapentin (NEURONTIN) 300 MG capsule Take 300 mg by mouth 2 (two) times daily.       . hydrALAZINE (APRESOLINE) 100 MG tablet Take 100 mg by mouth 3  (three) times daily.      . insulin glargine (LANTUS) 100 UNIT/ML injection Inject 10 Units into the skin at bedtime.      . insulin lispro (HUMALOG) 100 UNIT/ML injection Inject 3-10 Units into the skin 3 (three) times daily before meals. Sliding scale.  CBG 200: 3 UNITS, 250 5 UNITS, 300 7 UNITS, 350 9-10 UNITS      . isosorbide mononitrate (IMDUR) 120 MG 24 hr tablet Take 120 mg by mouth daily.      . metolazone (ZAROXOLYN) 5 MG tablet Take 5 mg by mouth 2 (two) times daily.      . pantoprazole (PROTONIX) 40 MG tablet Take 1 tablet (40 mg total) by mouth 2 (two) times daily.  60 tablet  1  . polyethylene glycol (MIRALAX / GLYCOLAX) packet Take 17 g by mouth daily as needed (for constipation).  14 each  0  . promethazine (PHENERGAN) 25 MG tablet Take 25 mg by mouth every 6 (six) hours as needed for nausea or vomiting.      . traMADol (ULTRAM) 50 MG tablet Take 50 mg by mouth 3 (three) times daily as needed for pain.      . nitroGLYCERIN (NITROSTAT) 0.4 MG SL tablet Place 0.4 mg under the tongue every 5 (five) minutes as needed for chest pain.         No Known Allergies  Review of Systems: As listed above, otherwise negative.  Physical Examination  Filed Vitals:   07/13/13 0729  BP: 161/58  Pulse: 59  Temp: 98.6 F (37 C)  TempSrc: Oral  Resp: 20  Height: 4\' 11"  (1.499 m)  Weight: 143 lb (64.864 kg)  SpO2: 100%    General: A&O x 3, WDWN  Pulmonary: Sym exp, good air movt, CTAB, no rales, rhonchi, & wheezing  Cardiac: RRR, Nl S1, S2, no Murmurs, rubs or gallops  Gastrointestinal: soft, NTND, -G/R, - HSM, - masses, - CVAT B  Musculoskeletal: M/S 5/5 throughout , Extremities without ischemic changes , palpable thrill  Laboratory See iStat  Medical Decision Making  Madeline [name] is a 58 y.o. female who presents with: distal fistula stenosis in L BC AVF, CKD III-IV.  The patient is scheduled for: L arm fistulogram, possible intervention.  I discussed with the patient the  nature of angiographic procedures, especially the limited patencies of any endovascular intervention.  The patient is aware of that the risks of an angiographic procedure include but are not limited  to: bleeding, infection, access site complications, renal failure, embolization, rupture of vessel, dissection, possible need for emergent surgical intervention, possible need for surgical procedures to treat the patient's pathology, and stroke and death.    The patient is aware of the risks and agrees to proceed.  Leonides SakeBrian Dorothee Napierkowski, MD [facility] Office: [phone] Pager: [phone]  07/13/2013, 7:35 AM

## 2013-07-13 NOTE — Progress Notes (Signed)
Discharge instruction given per MD order.  PT and CG able to verbalize understanding. Pt to car via wheelchair.

## 2013-08-07 ENCOUNTER — Encounter (HOSPITAL_COMMUNITY)
Admission: RE | Admit: 2013-08-07 | Discharge: 2013-08-07 | Disposition: A | Discharge status: Home / Self Care | Payer: Medicaid Other | Source: Ambulatory Visit | Attending: Nephrology | Admitting: Nephrology

## 2013-08-07 VITALS — BP 172/68 | HR 63 | Temp 98.0°F | Resp 20

## 2013-08-07 DIAGNOSIS — N179 Acute kidney failure, unspecified: Secondary | ICD-10-CM

## 2013-08-07 DIAGNOSIS — N189 Chronic kidney disease, unspecified: Principal | ICD-10-CM

## 2013-08-07 MED ORDER — DARBEPOETIN ALFA-POLYSORBATE 40 MCG/0.4ML IJ SOLN
INTRAMUSCULAR | Status: AC
  Filled 2013-08-07: qty 0.4

## 2013-08-07 MED ORDER — DARBEPOETIN ALFA-POLYSORBATE 40 MCG/0.4ML IJ SOLN
40.0000 ug | INTRAMUSCULAR | Status: DC
  Administered 2013-08-07: 40 ug via SUBCUTANEOUS

## 2013-08-08 LAB — POCT HEMOGLOBIN-HEMACUE
Hemoglobin: 10.6 g/dL — ABNORMAL LOW (ref 12.0–15.0)
Hemoglobin: 12.1 g/dL (ref 12.0–15.0)

## 2013-08-23 ENCOUNTER — Encounter (HOSPITAL_COMMUNITY): Payer: Self-pay | Admitting: Cardiology

## 2013-08-23 ENCOUNTER — Telehealth (HOSPITAL_COMMUNITY): Payer: Self-pay | Admitting: Cardiology

## 2013-08-23 NOTE — Telephone Encounter (Signed)
Attempting to schedule follow up I have been unable to reach this patient by phone.  A letter is being sent to the last known home address.

## 2013-09-04 ENCOUNTER — Encounter (HOSPITAL_COMMUNITY)
Admission: RE | Admit: 2013-09-04 | Discharge: 2013-09-04 | Disposition: A | Discharge status: Home / Self Care | Payer: Medicaid Other | Source: Ambulatory Visit | Attending: Nephrology | Admitting: Nephrology

## 2013-09-04 VITALS — BP 166/57 | HR 56 | Temp 97.9°F | Resp 20

## 2013-09-04 DIAGNOSIS — N179 Acute kidney failure, unspecified: Secondary | ICD-10-CM | POA: Insufficient documentation

## 2013-09-04 DIAGNOSIS — N189 Chronic kidney disease, unspecified: Secondary | ICD-10-CM | POA: Insufficient documentation

## 2013-09-04 LAB — COMPREHENSIVE METABOLIC PANEL
ALK PHOS: 168 U/L — AB (ref 39–117)
ALT: 52 U/L — AB (ref 0–35)
AST: 47 U/L — AB (ref 0–37)
Albumin: 2.9 g/dL — ABNORMAL LOW (ref 3.5–5.2)
BUN: 88 mg/dL — ABNORMAL HIGH (ref 6–23)
CALCIUM: 8.8 mg/dL (ref 8.4–10.5)
CO2: 20 mEq/L (ref 19–32)
Chloride: 97 mEq/L (ref 96–112)
Creatinine, Ser: 3.07 mg/dL — ABNORMAL HIGH (ref 0.50–1.10)
GFR calc Af Amer: 19 mL/min — ABNORMAL LOW (ref >90)
GFR calc non Af Amer: 16 mL/min — ABNORMAL LOW (ref >90)
GLUCOSE: 283 mg/dL — AB (ref 70–99)
POTASSIUM: 4.2 meq/L (ref 3.7–5.3)
Sodium: 134 mEq/L — ABNORMAL LOW (ref 137–147)
TOTAL PROTEIN: 6.5 g/dL (ref 6.0–8.3)
Total Bilirubin: <0.2 mg/dL — ABNORMAL LOW (ref 0.3–1.2)

## 2013-09-04 LAB — PHOSPHORUS: Phosphorus: 5.2 mg/dL — ABNORMAL HIGH (ref 2.3–4.6)

## 2013-09-04 LAB — CBC
HCT: 23.7 % — ABNORMAL LOW (ref 36.0–46.0)
HEMOGLOBIN: 7.9 g/dL — AB (ref 12.0–15.0)
MCH: 30 pg (ref 26.0–34.0)
MCHC: 33.3 g/dL (ref 30.0–36.0)
MCV: 90.1 fL (ref 78.0–100.0)
Platelets: 139 10*3/uL — ABNORMAL LOW (ref 150–400)
RBC: 2.63 MIL/uL — ABNORMAL LOW (ref 3.87–5.11)
RDW: 14.2 % (ref 11.5–15.5)
WBC: 5.4 10*3/uL (ref 4.0–10.5)

## 2013-09-04 LAB — IRON AND TIBC
Iron: 66 ug/dL (ref 42–135)
Saturation Ratios: 25 % (ref 20–55)
TIBC: 267 ug/dL (ref 250–470)
UIBC: 201 ug/dL (ref 125–400)

## 2013-09-04 LAB — FERRITIN: FERRITIN: 602 ng/mL — AB (ref 10–291)

## 2013-09-04 MED ORDER — DARBEPOETIN ALFA-POLYSORBATE 40 MCG/0.4ML IJ SOLN: 40.0000 ug | INTRAMUSCULAR | Status: DC

## 2013-09-04 MED ORDER — DARBEPOETIN ALFA-POLYSORBATE 60 MCG/0.3ML IJ SOLN
60.0000 ug | Freq: Once | INTRAMUSCULAR | Status: AC
  Administered 2013-09-04: 60 ug via SUBCUTANEOUS

## 2013-09-04 MED ORDER — DARBEPOETIN ALFA-POLYSORBATE 60 MCG/0.3ML IJ SOLN
INTRAMUSCULAR | Status: AC
  Filled 2013-09-04: qty 0.3

## 2013-09-18 ENCOUNTER — Encounter (HOSPITAL_COMMUNITY)
Admission: RE | Admit: 2013-09-18 | Discharge: 2013-09-18 | Disposition: A | Discharge status: Home / Self Care | Payer: Medicaid Other | Source: Ambulatory Visit | Attending: Nephrology | Admitting: Nephrology

## 2013-09-18 VITALS — BP 177/63 | HR 58 | Resp 16

## 2013-09-18 DIAGNOSIS — N189 Chronic kidney disease, unspecified: Secondary | ICD-10-CM | POA: Insufficient documentation

## 2013-09-18 DIAGNOSIS — N179 Acute kidney failure, unspecified: Secondary | ICD-10-CM | POA: Insufficient documentation

## 2013-09-18 LAB — POCT HEMOGLOBIN-HEMACUE: HEMOGLOBIN: 9.1 g/dL — AB (ref 12.0–15.0)

## 2013-09-18 MED ORDER — DARBEPOETIN ALFA-POLYSORBATE 60 MCG/0.3ML IJ SOLN
INTRAMUSCULAR | Status: AC
  Filled 2013-09-18: qty 0.3

## 2013-09-18 MED ORDER — DARBEPOETIN ALFA-POLYSORBATE 60 MCG/0.3ML IJ SOLN
60.0000 ug | INTRAMUSCULAR | Status: DC
  Administered 2013-09-18: 60 ug via SUBCUTANEOUS

## 2013-10-03 ENCOUNTER — Encounter (HOSPITAL_COMMUNITY)
Admission: RE | Admit: 2013-10-03 | Discharge: 2013-10-03 | Disposition: A | Discharge status: Home / Self Care | Payer: Medicaid Other | Source: Ambulatory Visit | Attending: Nephrology | Admitting: Nephrology

## 2013-10-03 VITALS — BP 154/62 | HR 54 | Temp 98.0°F | Resp 20

## 2013-10-03 DIAGNOSIS — N179 Acute kidney failure, unspecified: Secondary | ICD-10-CM

## 2013-10-03 DIAGNOSIS — N189 Chronic kidney disease, unspecified: Principal | ICD-10-CM

## 2013-10-03 LAB — COMPREHENSIVE METABOLIC PANEL
ALK PHOS: 185 U/L — AB (ref 39–117)
ALT: 32 U/L (ref 0–35)
AST: 53 U/L — ABNORMAL HIGH (ref 0–37)
Albumin: 2.9 g/dL — ABNORMAL LOW (ref 3.5–5.2)
BUN: 72 mg/dL — AB (ref 6–23)
CALCIUM: 9 mg/dL (ref 8.4–10.5)
CO2: 21 mEq/L (ref 19–32)
Chloride: 97 mEq/L (ref 96–112)
Creatinine, Ser: 3.19 mg/dL — ABNORMAL HIGH (ref 0.50–1.10)
GFR calc non Af Amer: 15 mL/min — ABNORMAL LOW (ref >90)
GFR, EST AFRICAN AMERICAN: 18 mL/min — AB (ref >90)
GLUCOSE: 556 mg/dL — AB (ref 70–99)
Potassium: 5.4 mEq/L — ABNORMAL HIGH (ref 3.7–5.3)
Sodium: 132 mEq/L — ABNORMAL LOW (ref 137–147)
TOTAL PROTEIN: 6.5 g/dL (ref 6.0–8.3)
Total Bilirubin: <0.2 mg/dL — ABNORMAL LOW (ref 0.3–1.2)

## 2013-10-03 LAB — IRON AND TIBC
Iron: 64 ug/dL (ref 42–135)
SATURATION RATIOS: 25 % (ref 20–55)
TIBC: 261 ug/dL (ref 250–470)
UIBC: 197 ug/dL (ref 125–400)

## 2013-10-03 LAB — CBC
HCT: 29.7 % — ABNORMAL LOW (ref 36.0–46.0)
Hemoglobin: 9.5 g/dL — ABNORMAL LOW (ref 12.0–15.0)
MCH: 29.4 pg (ref 26.0–34.0)
MCHC: 32 g/dL (ref 30.0–36.0)
MCV: 92 fL (ref 78.0–100.0)
Platelets: 153 10*3/uL (ref 150–400)
RBC: 3.23 MIL/uL — AB (ref 3.87–5.11)
RDW: 13.9 % (ref 11.5–15.5)
WBC: 4.9 10*3/uL (ref 4.0–10.5)

## 2013-10-03 LAB — PHOSPHORUS: PHOSPHORUS: 4.9 mg/dL — AB (ref 2.3–4.6)

## 2013-10-03 LAB — FERRITIN: Ferritin: 447 ng/mL — ABNORMAL HIGH (ref 10–291)

## 2013-10-03 MED ORDER — DARBEPOETIN ALFA-POLYSORBATE 60 MCG/0.3ML IJ SOLN
INTRAMUSCULAR | Status: AC
  Filled 2013-10-03: qty 0.3

## 2013-10-03 MED ORDER — DARBEPOETIN ALFA-POLYSORBATE 60 MCG/0.3ML IJ SOLN
60.0000 ug | INTRAMUSCULAR | Status: DC
  Administered 2013-10-03: 60 ug via SUBCUTANEOUS

## 2013-10-03 NOTE — Progress Notes (Signed)
Lab called with panic glucose 556; Pt. Asymptomatic, staqtes she did not take insulin this am; Spoke with [facility] with Dr. Arrie Aran,; pt instructed to immediately go home and take insulin, family will stay with patient, also instructed to notify PP Dr. Benedetto Goad of blood sugar

## 2013-10-12 ENCOUNTER — Encounter: Payer: Self-pay | Admitting: Vascular Surgery

## 2013-10-13 ENCOUNTER — Other Ambulatory Visit: Payer: Self-pay | Admitting: Vascular Surgery

## 2013-10-13 ENCOUNTER — Ambulatory Visit (HOSPITAL_COMMUNITY)
Admission: RE | Admit: 2013-10-13 | Discharge: 2013-10-13 | Disposition: A | Discharge status: Home / Self Care | Payer: Medicaid Other | Source: Ambulatory Visit | Attending: Vascular Surgery | Admitting: Vascular Surgery

## 2013-10-13 ENCOUNTER — Encounter: Payer: Self-pay | Admitting: Vascular Surgery

## 2013-10-13 ENCOUNTER — Ambulatory Visit (INDEPENDENT_AMBULATORY_CARE_PROVIDER_SITE_OTHER): Payer: Medicaid Other | Admitting: Vascular Surgery

## 2013-10-13 VITALS — BP 157/62 | HR 62 | Ht 59.0 in | Wt 138.0 lb

## 2013-10-13 DIAGNOSIS — N186 End stage renal disease: Secondary | ICD-10-CM | POA: Insufficient documentation

## 2013-10-13 DIAGNOSIS — Z4931 Encounter for adequacy testing for hemodialysis: Secondary | ICD-10-CM | POA: Insufficient documentation

## 2013-10-13 NOTE — Progress Notes (Signed)
    Established Dialysis Access  History of Present Illness  Madeline [name] is a 58 y.o. (1956-01-11) female who presents for re-evaluation of left BC AVF.  Pt underwent venoplasty of L cephalic vein (07/13/13) with bland and cutting balloon.  The patient remains off HD at this point.  She denies any steal sx from her fistula  The patient's PMH, PSH, SH, FamHx, Med, and Allergies are unchanged from 07/13/13.  On ROS today: no bleeding from fistula, no longer on HD, no steal from fistula.  Physical Examination  Filed Vitals:   10/13/13 1545  BP: 157/62  Pulse: 62  Height: 4\' 11"  (1.499 m)  Weight: 138 lb (62.596 kg)  SpO2: 100%   Body mass index is 27.86 kg/(m^2).  General: A&O x 3, WD, WN  Pulmonary: Sym exp, good air movt, CTAB, no rales, rhonchi, & wheezing  Cardiac: RRR, Nl S1, S2, no Murmurs, rubs or gallops  Vascular: Vessel Right Left  Radial Palpable Not Palpable  Ulnar Not Palpable Not Palpable  Brachial Palpable Palpable   Gastrointestinal: soft, NTND, -G/R, - HSM, - masses, - CVAT B  Musculoskeletal: M/S 5/5 throughout , Extremities without  ischemic changes , palpable thrill in access in L BC AVF, + bruit in access  Neurologic: Pain and light touch intact in extremities , Motor exam as listed above  Non-Invasive Vascular Imaging  Left Arm Access Duplex  (Date: 10/13/2013):   Diameters:  2.6-6.8 mm  Depth:  3.1-6.0 mm  PSV:  664 c/s   Stenotic segment distally in fistula   Medical Decision Making  Madeline [name] is a 58 y.o. female who presents with chronic kidney disease stage IV no longer requiring hemodialysis.    The patient has recurrent stenosis in the distal fistula as expected given the limited patencies of percutaneous interventions.  Given the patient remains off hemodialysis, I can't justify the risk of reintervention given the limited response to the intervention, i.e. restenosis after only 3 months.  If the patient needs to resume hemodialysis,  I would recommend replacing the stenotic segment with an interposition graft.  Thank you for opportunity to participate in this patient's care.  Leonides Sake, MD [facility] Office: [phone] Pager: [phone]  10/13/2013, 4:23 PM

## 2013-10-17 ENCOUNTER — Encounter (HOSPITAL_COMMUNITY): Payer: Medicaid Other

## 2013-10-24 ENCOUNTER — Encounter (HOSPITAL_COMMUNITY)
Admission: RE | Admit: 2013-10-24 | Discharge: 2013-10-24 | Disposition: A | Discharge status: Home / Self Care | Payer: Medicaid Other | Source: Ambulatory Visit | Attending: Nephrology | Admitting: Nephrology

## 2013-10-24 VITALS — BP 141/57 | HR 60 | Temp 98.1°F | Resp 20

## 2013-10-24 DIAGNOSIS — N179 Acute kidney failure, unspecified: Secondary | ICD-10-CM

## 2013-10-24 DIAGNOSIS — N189 Chronic kidney disease, unspecified: Secondary | ICD-10-CM | POA: Insufficient documentation

## 2013-10-24 MED ORDER — DARBEPOETIN ALFA-POLYSORBATE 60 MCG/0.3ML IJ SOLN
INTRAMUSCULAR | Status: AC
  Filled 2013-10-24: qty 0.3

## 2013-10-24 MED ORDER — DARBEPOETIN ALFA-POLYSORBATE 60 MCG/0.3ML IJ SOLN
60.0000 ug | INTRAMUSCULAR | Status: DC
  Administered 2013-10-24: 60 ug via SUBCUTANEOUS

## 2013-10-25 LAB — POCT HEMOGLOBIN-HEMACUE: HEMOGLOBIN: 10.2 g/dL — AB (ref 12.0–15.0)

## 2013-11-07 ENCOUNTER — Encounter (HOSPITAL_COMMUNITY): Payer: Medicaid Other

## 2013-11-14 ENCOUNTER — Encounter (HOSPITAL_COMMUNITY)
Admission: RE | Admit: 2013-11-14 | Discharge: 2013-11-14 | Disposition: A | Discharge status: Home / Self Care | Payer: Medicaid Other | Source: Ambulatory Visit | Attending: Nephrology | Admitting: Nephrology

## 2013-11-14 VITALS — BP 113/57 | HR 60 | Temp 98.0°F | Resp 18

## 2013-11-14 DIAGNOSIS — N189 Chronic kidney disease, unspecified: Secondary | ICD-10-CM | POA: Insufficient documentation

## 2013-11-14 DIAGNOSIS — N179 Acute kidney failure, unspecified: Secondary | ICD-10-CM | POA: Diagnosis not present

## 2013-11-14 LAB — IRON AND TIBC
Iron: 58 ug/dL (ref 42–135)
Saturation Ratios: 23 % (ref 20–55)
TIBC: 255 ug/dL (ref 250–470)
UIBC: 197 ug/dL (ref 125–400)

## 2013-11-14 LAB — COMPREHENSIVE METABOLIC PANEL
ALT: 31 U/L (ref 0–35)
AST: 51 U/L — AB (ref 0–37)
Albumin: 2.9 g/dL — ABNORMAL LOW (ref 3.5–5.2)
Alkaline Phosphatase: 143 U/L — ABNORMAL HIGH (ref 39–117)
Anion gap: 20 — ABNORMAL HIGH (ref 5–15)
BUN: 100 mg/dL — ABNORMAL HIGH (ref 6–23)
CHLORIDE: 97 meq/L (ref 96–112)
CO2: 21 meq/L (ref 19–32)
CREATININE: 3.94 mg/dL — AB (ref 0.50–1.10)
Calcium: 8.1 mg/dL — ABNORMAL LOW (ref 8.4–10.5)
GFR, EST AFRICAN AMERICAN: 14 mL/min — AB (ref >90)
GFR, EST NON AFRICAN AMERICAN: 12 mL/min — AB (ref >90)
GLUCOSE: 141 mg/dL — AB (ref 70–99)
Potassium: 3.6 mEq/L — ABNORMAL LOW (ref 3.7–5.3)
Sodium: 138 mEq/L (ref 137–147)
Total Protein: 6.6 g/dL (ref 6.0–8.3)

## 2013-11-14 LAB — FERRITIN: FERRITIN: 296 ng/mL — AB (ref 10–291)

## 2013-11-14 LAB — CBC
HCT: 31.6 % — ABNORMAL LOW (ref 36.0–46.0)
Hemoglobin: 10.2 g/dL — ABNORMAL LOW (ref 12.0–15.0)
MCH: 28.9 pg (ref 26.0–34.0)
MCHC: 32.3 g/dL (ref 30.0–36.0)
MCV: 89.5 fL (ref 78.0–100.0)
PLATELETS: 187 10*3/uL (ref 150–400)
RBC: 3.53 MIL/uL — ABNORMAL LOW (ref 3.87–5.11)
RDW: 13.5 % (ref 11.5–15.5)
WBC: 4.5 10*3/uL (ref 4.0–10.5)

## 2013-11-14 LAB — PHOSPHORUS: Phosphorus: 6.7 mg/dL — ABNORMAL HIGH (ref 2.3–4.6)

## 2013-11-14 MED ORDER — DARBEPOETIN ALFA-POLYSORBATE 60 MCG/0.3ML IJ SOLN
60.0000 ug | INTRAMUSCULAR | Status: DC
  Administered 2013-11-14: 60 ug via SUBCUTANEOUS

## 2013-11-14 MED ORDER — DARBEPOETIN ALFA-POLYSORBATE 60 MCG/0.3ML IJ SOLN
INTRAMUSCULAR | Status: AC
  Filled 2013-11-14: qty 0.3

## 2013-11-28 ENCOUNTER — Encounter (HOSPITAL_COMMUNITY)
Admission: RE | Admit: 2013-11-28 | Discharge: 2013-11-28 | Disposition: A | Discharge status: Home / Self Care | Payer: Medicaid Other | Source: Ambulatory Visit | Attending: Nephrology | Admitting: Nephrology

## 2013-11-28 VITALS — BP 94/51 | HR 62 | Resp 16

## 2013-11-28 DIAGNOSIS — N189 Chronic kidney disease, unspecified: Principal | ICD-10-CM

## 2013-11-28 DIAGNOSIS — N179 Acute kidney failure, unspecified: Secondary | ICD-10-CM | POA: Diagnosis not present

## 2013-11-28 LAB — POCT HEMOGLOBIN-HEMACUE: Hemoglobin: 11.5 g/dL — ABNORMAL LOW (ref 12.0–15.0)

## 2013-11-28 MED ORDER — DARBEPOETIN ALFA-POLYSORBATE 60 MCG/0.3ML IJ SOLN
INTRAMUSCULAR | Status: AC
  Administered 2013-11-28: 60 ug via SUBCUTANEOUS
  Filled 2013-11-28: qty 0.3

## 2013-11-28 MED ORDER — DARBEPOETIN ALFA-POLYSORBATE 60 MCG/0.3ML IJ SOLN
60.0000 ug | INTRAMUSCULAR | Status: DC
  Administered 2013-11-28: 60 ug via SUBCUTANEOUS

## 2013-12-12 ENCOUNTER — Encounter (HOSPITAL_COMMUNITY)
Admission: RE | Admit: 2013-12-12 | Discharge: 2013-12-12 | Disposition: A | Discharge status: Home / Self Care | Payer: Medicaid Other | Source: Ambulatory Visit | Attending: Nephrology | Admitting: Nephrology

## 2013-12-12 VITALS — BP 117/59 | HR 59 | Temp 97.7°F | Resp 20

## 2013-12-12 DIAGNOSIS — N179 Acute kidney failure, unspecified: Secondary | ICD-10-CM

## 2013-12-12 DIAGNOSIS — N184 Chronic kidney disease, stage 4 (severe): Secondary | ICD-10-CM | POA: Diagnosis not present

## 2013-12-12 DIAGNOSIS — N039 Chronic nephritic syndrome with unspecified morphologic changes: Principal | ICD-10-CM

## 2013-12-12 DIAGNOSIS — N189 Chronic kidney disease, unspecified: Secondary | ICD-10-CM

## 2013-12-12 DIAGNOSIS — D631 Anemia in chronic kidney disease: Secondary | ICD-10-CM | POA: Insufficient documentation

## 2013-12-12 DIAGNOSIS — I129 Hypertensive chronic kidney disease with stage 1 through stage 4 chronic kidney disease, or unspecified chronic kidney disease: Secondary | ICD-10-CM | POA: Diagnosis present

## 2013-12-12 LAB — COMPREHENSIVE METABOLIC PANEL
ALK PHOS: 161 U/L — AB (ref 39–117)
ALT: 24 U/L (ref 0–35)
AST: 39 U/L — ABNORMAL HIGH (ref 0–37)
Albumin: 3 g/dL — ABNORMAL LOW (ref 3.5–5.2)
Anion gap: 16 — ABNORMAL HIGH (ref 5–15)
BILIRUBIN TOTAL: 0.2 mg/dL — AB (ref 0.3–1.2)
BUN: 101 mg/dL — ABNORMAL HIGH (ref 6–23)
CALCIUM: 8.7 mg/dL (ref 8.4–10.5)
CHLORIDE: 96 meq/L (ref 96–112)
CO2: 22 meq/L (ref 19–32)
Creatinine, Ser: 4.67 mg/dL — ABNORMAL HIGH (ref 0.50–1.10)
GFR calc Af Amer: 11 mL/min — ABNORMAL LOW (ref >90)
GFR, EST NON AFRICAN AMERICAN: 10 mL/min — AB (ref >90)
Glucose, Bld: 158 mg/dL — ABNORMAL HIGH (ref 70–99)
POTASSIUM: 4.5 meq/L (ref 3.7–5.3)
SODIUM: 134 meq/L — AB (ref 137–147)
Total Protein: 6.9 g/dL (ref 6.0–8.3)

## 2013-12-12 LAB — IRON AND TIBC
Iron: 42 ug/dL (ref 42–135)
Saturation Ratios: 16 % — ABNORMAL LOW (ref 20–55)
TIBC: 255 ug/dL (ref 250–470)
UIBC: 213 ug/dL (ref 125–400)

## 2013-12-12 LAB — FERRITIN: FERRITIN: 131 ng/mL (ref 10–291)

## 2013-12-12 LAB — CBC
HEMATOCRIT: 37.9 % (ref 36.0–46.0)
Hemoglobin: 12.2 g/dL (ref 12.0–15.0)
MCH: 29.2 pg (ref 26.0–34.0)
MCHC: 32.2 g/dL (ref 30.0–36.0)
MCV: 90.7 fL (ref 78.0–100.0)
Platelets: 151 10*3/uL (ref 150–400)
RBC: 4.18 MIL/uL (ref 3.87–5.11)
RDW: 13.6 % (ref 11.5–15.5)
WBC: 4.8 10*3/uL (ref 4.0–10.5)

## 2013-12-12 LAB — HEPATITIS B SURFACE ANTIGEN: Hepatitis B Surface Ag: NEGATIVE

## 2013-12-12 LAB — PHOSPHORUS: Phosphorus: 5.2 mg/dL — ABNORMAL HIGH (ref 2.3–4.6)

## 2013-12-12 MED ORDER — DARBEPOETIN ALFA-POLYSORBATE 60 MCG/0.3ML IJ SOLN: 60.0000 ug | INTRAMUSCULAR | Status: DC

## 2013-12-13 LAB — PTH, INTACT AND CALCIUM
Calcium, Total (PTH): 8.6 mg/dL (ref 8.4–10.5)
PTH: 226.9 pg/mL — AB (ref 14.0–72.0)

## 2013-12-21 ENCOUNTER — Emergency Department (HOSPITAL_COMMUNITY): Payer: Medicaid Other

## 2013-12-21 ENCOUNTER — Encounter (HOSPITAL_COMMUNITY): Payer: Self-pay | Admitting: Emergency Medicine

## 2013-12-21 ENCOUNTER — Emergency Department (HOSPITAL_COMMUNITY)
Admission: EM | Admit: 2013-12-21 | Discharge: 2013-12-21 | Disposition: A | Discharge status: Home / Self Care | Payer: Medicaid Other | Attending: Emergency Medicine | Admitting: Emergency Medicine

## 2013-12-21 DIAGNOSIS — Z7982 Long term (current) use of aspirin: Secondary | ICD-10-CM | POA: Insufficient documentation

## 2013-12-21 DIAGNOSIS — N186 End stage renal disease: Secondary | ICD-10-CM | POA: Diagnosis not present

## 2013-12-21 DIAGNOSIS — Z79899 Other long term (current) drug therapy: Secondary | ICD-10-CM | POA: Insufficient documentation

## 2013-12-21 DIAGNOSIS — R51 Headache: Secondary | ICD-10-CM | POA: Insufficient documentation

## 2013-12-21 DIAGNOSIS — I2510 Atherosclerotic heart disease of native coronary artery without angina pectoris: Secondary | ICD-10-CM | POA: Diagnosis not present

## 2013-12-21 DIAGNOSIS — Z794 Long term (current) use of insulin: Secondary | ICD-10-CM | POA: Diagnosis not present

## 2013-12-21 DIAGNOSIS — I158 Other secondary hypertension: Secondary | ICD-10-CM | POA: Diagnosis not present

## 2013-12-21 DIAGNOSIS — I509 Heart failure, unspecified: Secondary | ICD-10-CM | POA: Insufficient documentation

## 2013-12-21 DIAGNOSIS — Z8619 Personal history of other infectious and parasitic diseases: Secondary | ICD-10-CM | POA: Diagnosis not present

## 2013-12-21 DIAGNOSIS — M549 Dorsalgia, unspecified: Secondary | ICD-10-CM | POA: Diagnosis not present

## 2013-12-21 DIAGNOSIS — J449 Chronic obstructive pulmonary disease, unspecified: Secondary | ICD-10-CM | POA: Insufficient documentation

## 2013-12-21 DIAGNOSIS — R5381 Other malaise: Secondary | ICD-10-CM | POA: Diagnosis not present

## 2013-12-21 DIAGNOSIS — R3919 Other difficulties with micturition: Secondary | ICD-10-CM | POA: Insufficient documentation

## 2013-12-21 DIAGNOSIS — R109 Unspecified abdominal pain: Secondary | ICD-10-CM | POA: Diagnosis not present

## 2013-12-21 DIAGNOSIS — Z87891 Personal history of nicotine dependence: Secondary | ICD-10-CM | POA: Insufficient documentation

## 2013-12-21 DIAGNOSIS — Z8669 Personal history of other diseases of the nervous system and sense organs: Secondary | ICD-10-CM | POA: Diagnosis not present

## 2013-12-21 DIAGNOSIS — J4489 Other specified chronic obstructive pulmonary disease: Secondary | ICD-10-CM | POA: Insufficient documentation

## 2013-12-21 DIAGNOSIS — R112 Nausea with vomiting, unspecified: Secondary | ICD-10-CM | POA: Diagnosis not present

## 2013-12-21 DIAGNOSIS — E119 Type 2 diabetes mellitus without complications: Secondary | ICD-10-CM | POA: Diagnosis not present

## 2013-12-21 DIAGNOSIS — I252 Old myocardial infarction: Secondary | ICD-10-CM | POA: Diagnosis not present

## 2013-12-21 DIAGNOSIS — R5383 Other fatigue: Secondary | ICD-10-CM

## 2013-12-21 DIAGNOSIS — Z8673 Personal history of transient ischemic attack (TIA), and cerebral infarction without residual deficits: Secondary | ICD-10-CM | POA: Diagnosis not present

## 2013-12-21 DIAGNOSIS — R42 Dizziness and giddiness: Secondary | ICD-10-CM | POA: Diagnosis not present

## 2013-12-21 DIAGNOSIS — I159 Secondary hypertension, unspecified: Secondary | ICD-10-CM

## 2013-12-21 LAB — COMPREHENSIVE METABOLIC PANEL
ALBUMIN: 3.4 g/dL — AB (ref 3.5–5.2)
ALT: 22 U/L (ref 0–35)
AST: 34 U/L (ref 0–37)
Alkaline Phosphatase: 150 U/L — ABNORMAL HIGH (ref 39–117)
Anion gap: 21 — ABNORMAL HIGH (ref 5–15)
BILIRUBIN TOTAL: 0.3 mg/dL (ref 0.3–1.2)
BUN: 100 mg/dL — ABNORMAL HIGH (ref 6–23)
CHLORIDE: 96 meq/L (ref 96–112)
CO2: 21 mEq/L (ref 19–32)
Calcium: 9.5 mg/dL (ref 8.4–10.5)
Creatinine, Ser: 4.07 mg/dL — ABNORMAL HIGH (ref 0.50–1.10)
GFR calc Af Amer: 13 mL/min — ABNORMAL LOW (ref >90)
GFR, EST NON AFRICAN AMERICAN: 11 mL/min — AB (ref >90)
Glucose, Bld: 307 mg/dL — ABNORMAL HIGH (ref 70–99)
Potassium: 4.8 mEq/L (ref 3.7–5.3)
SODIUM: 138 meq/L (ref 137–147)
Total Protein: 8 g/dL (ref 6.0–8.3)

## 2013-12-21 LAB — URINALYSIS, ROUTINE W REFLEX MICROSCOPIC
Bilirubin Urine: NEGATIVE
Glucose, UA: >1000 mg/dL — AB
Ketones, ur: NEGATIVE mg/dL
LEUKOCYTES UA: NEGATIVE
Nitrite: NEGATIVE
PROTEIN: 100 mg/dL — AB
SPECIFIC GRAVITY, URINE: 1.016 (ref 1.005–1.030)
UROBILINOGEN UA: 0.2 mg/dL (ref 0.0–1.0)
pH: 6.5 (ref 5.0–8.0)

## 2013-12-21 LAB — CBC WITH DIFFERENTIAL/PLATELET
BASOS ABS: 0 10*3/uL (ref 0.0–0.1)
BASOS PCT: 0 % (ref 0–1)
Eosinophils Absolute: 0 10*3/uL (ref 0.0–0.7)
Eosinophils Relative: 1 % (ref 0–5)
HCT: 39.4 % (ref 36.0–46.0)
Hemoglobin: 13.2 g/dL (ref 12.0–15.0)
LYMPHS PCT: 16 % (ref 12–46)
Lymphs Abs: 1.4 10*3/uL (ref 0.7–4.0)
MCH: 29 pg (ref 26.0–34.0)
MCHC: 33.5 g/dL (ref 30.0–36.0)
MCV: 86.6 fL (ref 78.0–100.0)
Monocytes Absolute: 0.7 10*3/uL (ref 0.1–1.0)
Monocytes Relative: 8 % (ref 3–12)
NEUTROS ABS: 6.6 10*3/uL (ref 1.7–7.7)
Neutrophils Relative %: 75 % (ref 43–77)
Platelets: 200 10*3/uL (ref 150–400)
RBC: 4.55 MIL/uL (ref 3.87–5.11)
RDW: 13.6 % (ref 11.5–15.5)
WBC: 8.7 10*3/uL (ref 4.0–10.5)

## 2013-12-21 LAB — URINE MICROSCOPIC-ADD ON

## 2013-12-21 LAB — CBG MONITORING, ED: Glucose-Capillary: 304 mg/dL — ABNORMAL HIGH (ref 70–99)

## 2013-12-21 LAB — I-STAT TROPONIN, ED: Troponin i, poc: 0 ng/mL (ref 0.00–0.08)

## 2013-12-21 LAB — I-STAT CG4 LACTIC ACID, ED: LACTIC ACID, VENOUS: 0.84 mmol/L (ref 0.5–2.2)

## 2013-12-21 MED ORDER — CLONIDINE HCL 0.1 MG PO TABS
0.2000 mg | ORAL_TABLET | Freq: Once | ORAL | Status: AC
  Administered 2013-12-21: 0.2 mg via ORAL
  Filled 2013-12-21: qty 2

## 2013-12-21 MED ORDER — ONDANSETRON HCL 4 MG/2ML IJ SOLN
4.0000 mg | Freq: Once | INTRAMUSCULAR | Status: AC
  Administered 2013-12-21: 4 mg via INTRAVENOUS
  Filled 2013-12-21: qty 2

## 2013-12-21 MED ORDER — HYDRALAZINE HCL 50 MG PO TABS
100.0000 mg | ORAL_TABLET | Freq: Once | ORAL | Status: AC
  Administered 2013-12-21: 100 mg via ORAL
  Filled 2013-12-21 (×2): qty 2

## 2013-12-21 MED ORDER — PROMETHAZINE HCL 25 MG PO TABS: 25.0000 mg | ORAL_TABLET | Freq: Four times a day (QID) | ORAL | Status: DC | PRN

## 2013-12-21 MED ORDER — CLONIDINE HCL 0.1 MG PO TABS: 0.1000 mg | ORAL_TABLET | Freq: Once | ORAL | Status: DC

## 2013-12-21 MED ORDER — MORPHINE SULFATE 4 MG/ML IJ SOLN
4.0000 mg | Freq: Once | INTRAMUSCULAR | Status: AC
  Administered 2013-12-21: 4 mg via INTRAVENOUS
  Filled 2013-12-21: qty 1

## 2013-12-21 NOTE — Discharge Instructions (Signed)
Phenergan for nausea. Make sure to take all your regular medications. If symptoms worsening, return to ER.   Nausea and Vomiting Nausea is a sick feeling that often comes before throwing up (vomiting). Vomiting is a reflex where stomach contents come out of your mouth. Vomiting can cause severe loss of body fluids (dehydration). Children and elderly adults can become dehydrated quickly, especially if they also have diarrhea. Nausea and vomiting are symptoms of a condition or disease. It is important to find the cause of your symptoms. CAUSES   Direct irritation of the stomach lining. This irritation can result from increased acid production (gastroesophageal reflux disease), infection, food poisoning, taking certain medicines (such as nonsteroidal anti-inflammatory drugs), alcohol use, or tobacco use.  Signals from the brain.These signals could be caused by a headache, heat exposure, an inner ear disturbance, increased pressure in the brain from injury, infection, a tumor, or a concussion, pain, emotional stimulus, or metabolic problems.  An obstruction in the gastrointestinal tract (bowel obstruction).  Illnesses such as diabetes, hepatitis, gallbladder problems, appendicitis, kidney problems, cancer, sepsis, atypical symptoms of a heart attack, or eating disorders.  Medical treatments such as chemotherapy and radiation.  Receiving medicine that makes you sleep (general anesthetic) during surgery. DIAGNOSIS Your caregiver may ask for tests to be done if the problems do not improve after a few days. Tests may also be done if symptoms are severe or if the reason for the nausea and vomiting is not clear. Tests may include:  Urine tests.  Blood tests.  Stool tests.  Cultures (to look for evidence of infection).  X-rays or other imaging studies. Test results can help your caregiver make decisions about treatment or the need for additional tests. TREATMENT You need to stay well hydrated.  Drink frequently but in small amounts.You may wish to drink water, sports drinks, clear broth, or eat frozen ice pops or gelatin dessert to help stay hydrated.When you eat, eating slowly may help prevent nausea.There are also some antinausea medicines that may help prevent nausea. HOME CARE INSTRUCTIONS   Take all medicine as directed by your caregiver.  If you do not have an appetite, do not force yourself to eat. However, you must continue to drink fluids.  If you have an appetite, eat a normal diet unless your caregiver tells you differently.  Eat a variety of complex carbohydrates (rice, wheat, potatoes, bread), lean meats, yogurt, fruits, and vegetables.  Avoid high-fat foods because they are more difficult to digest.  Drink enough water and fluids to keep your urine clear or pale yellow.  If you are dehydrated, ask your caregiver for specific rehydration instructions. Signs of dehydration may include:  Severe thirst.  Dry lips and mouth.  Dizziness.  Dark urine.  Decreasing urine frequency and amount.  Confusion.  Rapid breathing or pulse. SEEK IMMEDIATE MEDICAL CARE IF:   You have blood or brown flecks (like coffee grounds) in your vomit.  You have black or bloody stools.  You have a severe headache or stiff neck.  You are confused.  You have severe abdominal pain.  You have chest pain or trouble breathing.  You do not urinate at least once every 8 hours.  You develop cold or clammy skin.  You continue to vomit for longer than 24 to 48 hours.  You have a fever. MAKE SURE YOU:   Understand these instructions.  Will watch your condition.  Will get help right away if you are not doing well or get worse. Document  Released: 04/27/2005 Document Revised: 07/20/2011 Document Reviewed: 09/24/2010 [facility] Patient Information 2015 [place], [place]. This information is not intended to replace advice given to you by your health care provider. Make sure you  discuss any questions you have with your health care provider.

## 2013-12-21 NOTE — ED Notes (Addendum)
Patient ambulated down the hallway with a steady gait and with no difficulty. PA made aware.

## 2013-12-21 NOTE — ED Notes (Signed)
Patient states she normally feels weak and takes a nausea pill , but threw her medicine up today.patient's daughter called EMS because she was having back pain and a headache.

## 2013-12-21 NOTE — ED Notes (Signed)
Bed: [id number] Expected date: 12/21/13 Expected time: 12:00 PM Means of arrival: Ambulance Comments: N/v; abd pain

## 2013-12-21 NOTE — ED Provider Notes (Signed)
CSN: [id number]     Arrival date & time 12/21/13  1218 History   First MD Initiated Contact with Patient 12/21/13 1226     Chief Complaint  Patient presents with  . Weakness  . Nausea     (Consider location/radiation/quality/duration/timing/severity/associated sxs/prior Treatment) HPI Charmian Majewski is a 58 y.o. female who presents to ED with multiple complaints. Pt with hx of CAD, MI, CHF, CKD, CVA presents to ED with complaint of abdominal pain, back pain with left sciatica, nausea, vomiting, headache, inability to urinate since last night.  According to pt's grand daughter who called EMS, pt was alone at home and called her family member who thought she was "confused and unresponsive." Pt does not recall that, but does remember being on the phone with this family member. Pt states she told them she was not feeling well and to call EMS. Pt denies any known fever, chills. States she did not have any head injuries. States nausea and vomiting persistent since this morning, unable to keep her regular medications down. She denies any current chest pain, no sob. States has a fistula for dialysis but has not started to dialyze yet.   Past Medical History  Diagnosis Date  . CHF (congestive heart failure)     a. HFpEF with RHF/anasarca/pHTN.  . COPD (chronic obstructive pulmonary disease)   . Coronary artery disease     a. Per [place] records: NSTEMI 01/2012, tx medically given ARF but suspected CAD. b. Stress test 12/16/11 reported w/o ischemia.  . Hypertension   . Diabetes mellitus   . OSA (obstructive sleep apnea)     a. Pt reported used to use CPAP in [place], but "ran out" when came to [facility].  Marland Kitchen CKD (chronic kidney disease), stage III     a. Per [place] records: h/o ARF after CTA that ruled out PE.  Marland Kitchen Pulmonary hypertension     a. RHC 02/28/13: mod pulm HTN with normal PVR suggestive of predominantly pulmonary venous HTN.  . Right heart failure   . Anasarca     a. Per [place] records - due to pulm HTN with R HF.    Marland Kitchen Aseptic meningitis     a. 09/2012: adm in [place] for metabolic encephalopathy, oliguric tubular necrosis, anemia, HTN, possible CVA, HHNKA.  Marland Kitchen CVA (cerebral infarction)     a. Per [place] records, "possible CVA" 09/2012 but MRI reportedly negative.  . Abnormal Doppler ultrasound of carotid artery     a. Per [place] records: <50% LICA.   Past Surgical History  Procedure Laterality Date  . Tubal ligation    . Abdominal hysterectomy    . Esophagogastroduodenoscopy N/A 02/16/2013    Procedure: ESOPHAGOGASTRODUODENOSCOPY (EGD);  Surgeon: Graylin Shiver, MD;  Location: [facility] ENDOSCOPY;  Service: Endoscopy;  Laterality: N/A;  . Insertion of dialysis catheter Right 03/03/2013    Procedure: INSERTION OF DIALYSIS CATHETER;  Surgeon: Larina Earthly, MD;  Location: [facility] OR;  Service: Vascular;  Laterality: Right;  Right Internal Jugular Placement  . Av fistula placement Left 03/03/2013    Procedure: ARTERIOVENOUS (AV) FISTULA CREATION, Brachial/Cephalic;  Surgeon: Larina Earthly, MD;  Location: [facility] OR;  Service: Vascular;  Laterality: Left;   Family History  Problem Relation Age of Onset  . Diabetes Mellitus II Sister   . Diabetes Mellitus II Brother   . CAD Brother    History  Substance Use Topics  . Smoking status: Former Games developer  . Smokeless tobacco: Current User    Types: Snuff,  Chew  . Alcohol Use: No   OB History   Grav Para Term Preterm Abortions TAB SAB Ect Mult Living                 Review of Systems  Constitutional: Positive for fatigue. Negative for fever and chills.  Respiratory: Negative for cough, chest tightness and shortness of breath.   Cardiovascular: Negative for chest pain, palpitations and leg swelling.  Gastrointestinal: Positive for nausea, vomiting and abdominal pain. Negative for diarrhea.  Genitourinary: Positive for difficulty urinating. Negative for dysuria, frequency, hematuria, flank pain and pelvic pain.  Musculoskeletal: Positive for back pain. Negative for arthralgias,  myalgias, neck pain and neck stiffness.  Skin: Negative for rash.  Neurological: Positive for dizziness, light-headedness and headaches. Negative for weakness.  All other systems reviewed and are negative.     Allergies  Review of patient's allergies indicates no known allergies.  Home Medications   Prior to Admission medications   Medication Sig Start Date End Date Taking? Authorizing Provider  acetaminophen (TYLENOL) 500 MG tablet Take 500-1,000 mg by mouth every 6 (six) hours as needed for moderate pain.    Historical Provider, MD  albuterol (PROVENTIL HFA;VENTOLIN HFA) 108 (90 BASE) MCG/ACT inhaler Inhale 2 puffs into the lungs every 6 (six) hours as needed for wheezing or shortness of breath.     Historical Provider, MD  amLODipine (NORVASC) 10 MG tablet Take 10 mg by mouth daily.    Historical Provider, MD  aspirin EC 81 MG tablet Take 81 mg by mouth daily.    Historical Provider, MD  atorvastatin (LIPITOR) 40 MG tablet Take 40 mg by mouth daily.    Historical Provider, MD  calcitRIOL (ROCALTROL) 0.25 MCG capsule Take 0.25 mcg by mouth daily.    Historical Provider, MD  calcium acetate, Phos Binder, (PHOSLYRA) 667 MG/5ML SOLN Take 667 mg by mouth 3 (three) times daily with meals.    Historical Provider, MD  carvedilol (COREG) 25 MG tablet Take 25 mg by mouth 2 (two) times daily with a meal.    Historical Provider, MD  cloNIDine (CATAPRES) 0.1 MG tablet Take 0.1-0.2 mg by mouth 2 (two) times daily. Takes 0.1mg  every morning and 0.2mg  at bedtime    Historical Provider, MD  ferrous sulfate 325 (65 FE) MG tablet Take 325 mg by mouth daily with breakfast.    Historical Provider, MD  furosemide (LASIX) 80 MG tablet Take 80 mg by mouth 2 (two) times daily.    Historical Provider, MD  gabapentin (NEURONTIN) 300 MG capsule Take 300 mg by mouth 2 (two) times daily.     Historical Provider, MD  hydrALAZINE (APRESOLINE) 100 MG tablet Take 100 mg by mouth 3 (three) times daily.    Historical  Provider, MD  insulin glargine (LANTUS) 100 UNIT/ML injection Inject 10 Units into the skin at bedtime.    Historical Provider, MD  insulin lispro (HUMALOG) 100 UNIT/ML injection Inject 3-10 Units into the skin 3 (three) times daily before meals. Sliding scale.  CBG 200: 3 UNITS, 250 5 UNITS, 300 7 UNITS, 350 9-10 UNITS    Historical Provider, MD  isosorbide mononitrate (IMDUR) 120 MG 24 hr tablet Take 120 mg by mouth daily.    Historical Provider, MD  metolazone (ZAROXOLYN) 5 MG tablet Take 5 mg by mouth 2 (two) times daily.    Historical Provider, MD  nitroGLYCERIN (NITROSTAT) 0.4 MG SL tablet Place 0.4 mg under the tongue every 5 (five) minutes as needed for chest pain.  Historical Provider, MD  pantoprazole (PROTONIX) 40 MG tablet Take 1 tablet (40 mg total) by mouth 2 (two) times daily. 06/05/13   Windell Hummingbird, MD  polyethylene glycol [facility] / Ethelene Hal) packet Take 17 g by mouth daily as needed (for constipation). 03/05/13   Osvaldo Shipper, MD  promethazine (PHENERGAN) 25 MG tablet Take 25 mg by mouth every 6 (six) hours as needed for nausea or vomiting.    Historical Provider, MD  traMADol (ULTRAM) 50 MG tablet Take 50 mg by mouth 3 (three) times daily as needed for pain.    Historical Provider, MD   BP 213/77  Temp(Src) 98.7 F (37.1 C) (Oral)  Resp 7  Ht 4\' 9"  (1.448 m)  Wt 140 lb (63.504 kg)  BMI 30.29 kg/m2  SpO2 100% Physical Exam  Nursing note and vitals reviewed. Constitutional: She appears well-developed and well-nourished. No distress.  HENT:  Head: Normocephalic.  Eyes: Conjunctivae are normal.  Neck: Neck supple.  Cardiovascular: Normal rate, regular rhythm and normal heart sounds.   Pulmonary/Chest: Effort normal and breath sounds normal. No respiratory distress. She has no wheezes. She has no rales.  Abdominal: Soft. Bowel sounds are normal. She exhibits no distension. There is tenderness. There is no rebound and no guarding.  diffuse tenderness   Musculoskeletal: She exhibits no edema.  Neurological: She is alert.  Skin: Skin is warm and dry.  Psychiatric: She has a normal mood and affect. Her behavior is normal.    ED Course  Procedures (including critical care time) Labs Review Labs Reviewed  COMPREHENSIVE METABOLIC PANEL - Abnormal; Notable for the following:    Glucose, Bld 307 (*)    BUN 100 (*)    Creatinine, Ser 4.07 (*)    Albumin 3.4 (*)    Alkaline Phosphatase 150 (*)    GFR calc non Af Amer 11 (*)    GFR calc Af Amer 13 (*)    Anion gap 21 (*)    All other components within normal limits  URINALYSIS, ROUTINE W REFLEX MICROSCOPIC - Abnormal; Notable for the following:    Glucose, UA >1000 (*)    Hgb urine dipstick SMALL (*)    Protein, ur 100 (*)    All other components within normal limits  CBG MONITORING, ED - Abnormal; Notable for the following:    Glucose-Capillary 304 (*)    All other components within normal limits  CBC WITH DIFFERENTIAL  URINE MICROSCOPIC-ADD ON  I-STAT CG4 LACTIC ACID, ED  Rosezena Sensor, ED    Imaging Review Dg Chest 2 View  12/21/2013   CLINICAL DATA:  Difficulty breathing  EXAM: CHEST  2 VIEW  COMPARISON:  Chest radiograph June 03, 2013; chest CT June 03, 2013  FINDINGS: There is no edema or consolidation. Heart is enlarged with pulmonary vascularity within normal limits. No adenopathy. There is atherosclerotic change in aorta. No bone lesions.  IMPRESSION: Cardiomegaly.  No edema or consolidation.   Electronically Signed   By: Bretta Bang M.D.   On: 12/21/2013 13:41   Ct Head Wo Contrast  12/21/2013   CLINICAL DATA:  Weakness and nausea for 2 days.  EXAM: CT HEAD WITHOUT CONTRAST  TECHNIQUE: Contiguous axial images were obtained from the base of the skull through the vertex without intravenous contrast.  COMPARISON:  03/15/2013.  FINDINGS: No mass lesion, mass effect, midline shift, hydrocephalus, hemorrhage. No territorial ischemia or acute infarction. Intracranial  atherosclerosis is present. Calvarium intact. Visualized paranasal sinuses appear clear.  IMPRESSION: Negative CT  head.   Electronically Signed   By: Andreas NewportGeoffrey  Lamke M.D.   On: 12/21/2013 13:52     EKG Interpretation   Date/Time:  Thursday December 21 2013 12:25:28 EDT Ventricular Rate:  80 PR Interval:  160 QRS Duration: 83 QT Interval:  395 QTC Calculation: 456 R Axis:   -8 Text Interpretation:  Sinus rhythm Probable left atrial enlargement  otherwise nml Confirmed by Juleen ChinaKOHUT  MD, STEPHEN ([id number]) on 12/21/2013 4:42:01  PM      MDM   Final diagnoses:  Non-intractable vomiting with nausea, vomiting of unspecified type  Secondary hypertension, unspecified  End stage renal disease   Pt with nausea, vomiting, back pain, headache, BP elevated at 213/77. Pt states she did not tolerate her meds this morning. Her neurological exam is normal. Will get labs, CT head, CXR, ua. Pt states she was unable to urinate, bladder scanner showed 190ccs  3:23 PM Pt was able to urinate. Nausea and vomiting improved. She was able to take and tolerate her BP meds, ordered hydralazine and clonidine. She is drinking water. States headache is almost resolved. Feeling much improved. Labs unremarkable. Creatinine elevated, at baseline. Discussed with Dr. Juleen ChinaKohut, who has seen pt. Pt wishes to go home. Will d/c home with nausea medications and close outpatient follow up. Instructed to return if worsening.   4:00 PM Ambulated put up and down hallway. Pt has no symptoms. Continues to want to go home. Will dc with strict precautions to return if any worsening symptoms.   Filed Vitals:   12/21/13 1416 12/21/13 1433 12/21/13 1445 12/21/13 1500  BP: 217/76 212/89 219/64 204/70  Pulse: 84 80 79 79  Temp:  98.5 F (36.9 C)    TempSrc:  Oral    Resp: 16 16 15 13   Height:      Weight:      SpO2: 100% 99% 99% 99%     Lottie Musselatyana A Fayrene Towner, PA-C 12/21/13 1642

## 2013-12-21 NOTE — ED Notes (Signed)
Per EMS- Patient c/o weakness, nausea x 2 days. Patient states she vomited x 2 today. Patient's daughter called EMS without patient knowing that she did. Patient was tearful and anxious when EMS arrived. Patient states that she is to start dialysis soon and has a shunt in the left arm.

## 2013-12-24 NOTE — ED Provider Notes (Signed)
Medical screening examination/treatment/procedure(s) were performed by non-physician practitioner and as supervising physician I was immediately available for consultation/collaboration.   EKG Interpretation   Date/Time:  Thursday December 21 2013 12:25:28 EDT Ventricular Rate:  80 PR Interval:  160 QRS Duration: 83 QT Interval:  395 QTC Calculation: 456 R Axis:   -8 Text Interpretation:  Sinus rhythm Probable left atrial enlargement  otherwise nml Confirmed by Juleen ChinaKOHUT  MD, Omega Slager ([id number]) on 12/21/2013 4:42:01  PM       Raeford RazorStephen Gamal Todisco, MD 12/24/13 1422

## 2013-12-26 ENCOUNTER — Inpatient Hospital Stay (HOSPITAL_COMMUNITY): Admission: RE | Admit: 2013-12-26 | Payer: Medicaid Other | Source: Ambulatory Visit

## 2014-01-07 ENCOUNTER — Encounter (HOSPITAL_COMMUNITY): Payer: Self-pay | Admitting: Emergency Medicine

## 2014-01-07 ENCOUNTER — Inpatient Hospital Stay (HOSPITAL_COMMUNITY)
Admission: EM | Admit: 2014-01-07 | Discharge: 2014-01-11 | DRG: 637 | Disposition: A | Discharge status: Home / Self Care | Payer: Medicaid Other | Attending: Internal Medicine | Admitting: Internal Medicine

## 2014-01-07 ENCOUNTER — Emergency Department (HOSPITAL_COMMUNITY): Payer: Medicaid Other

## 2014-01-07 DIAGNOSIS — I2789 Other specified pulmonary heart diseases: Secondary | ICD-10-CM | POA: Diagnosis present

## 2014-01-07 DIAGNOSIS — K219 Gastro-esophageal reflux disease without esophagitis: Secondary | ICD-10-CM | POA: Diagnosis present

## 2014-01-07 DIAGNOSIS — I252 Old myocardial infarction: Secondary | ICD-10-CM | POA: Diagnosis not present

## 2014-01-07 DIAGNOSIS — Z87891 Personal history of nicotine dependence: Secondary | ICD-10-CM

## 2014-01-07 DIAGNOSIS — D638 Anemia in other chronic diseases classified elsewhere: Secondary | ICD-10-CM | POA: Diagnosis present

## 2014-01-07 DIAGNOSIS — I5032 Chronic diastolic (congestive) heart failure: Secondary | ICD-10-CM | POA: Diagnosis present

## 2014-01-07 DIAGNOSIS — R339 Retention of urine, unspecified: Secondary | ICD-10-CM | POA: Diagnosis present

## 2014-01-07 DIAGNOSIS — Z9989 Dependence on other enabling machines and devices: Secondary | ICD-10-CM

## 2014-01-07 DIAGNOSIS — N2581 Secondary hyperparathyroidism of renal origin: Secondary | ICD-10-CM | POA: Diagnosis present

## 2014-01-07 DIAGNOSIS — N185 Chronic kidney disease, stage 5: Secondary | ICD-10-CM

## 2014-01-07 DIAGNOSIS — K279 Peptic ulcer, site unspecified, unspecified as acute or chronic, without hemorrhage or perforation: Secondary | ICD-10-CM

## 2014-01-07 DIAGNOSIS — A088 Other specified intestinal infections: Secondary | ICD-10-CM | POA: Diagnosis present

## 2014-01-07 DIAGNOSIS — Z992 Dependence on renal dialysis: Secondary | ICD-10-CM

## 2014-01-07 DIAGNOSIS — I5033 Acute on chronic diastolic (congestive) heart failure: Secondary | ICD-10-CM

## 2014-01-07 DIAGNOSIS — I10 Essential (primary) hypertension: Secondary | ICD-10-CM

## 2014-01-07 DIAGNOSIS — Z794 Long term (current) use of insulin: Secondary | ICD-10-CM | POA: Diagnosis not present

## 2014-01-07 DIAGNOSIS — D631 Anemia in chronic kidney disease: Secondary | ICD-10-CM | POA: Diagnosis present

## 2014-01-07 DIAGNOSIS — E785 Hyperlipidemia, unspecified: Secondary | ICD-10-CM | POA: Diagnosis present

## 2014-01-07 DIAGNOSIS — E119 Type 2 diabetes mellitus without complications: Secondary | ICD-10-CM

## 2014-01-07 DIAGNOSIS — I120 Hypertensive chronic kidney disease with stage 5 chronic kidney disease or end stage renal disease: Secondary | ICD-10-CM | POA: Diagnosis present

## 2014-01-07 DIAGNOSIS — E1310 Other specified diabetes mellitus with ketoacidosis without coma: Principal | ICD-10-CM | POA: Diagnosis present

## 2014-01-07 DIAGNOSIS — R112 Nausea with vomiting, unspecified: Secondary | ICD-10-CM | POA: Diagnosis not present

## 2014-01-07 DIAGNOSIS — Z7982 Long term (current) use of aspirin: Secondary | ICD-10-CM | POA: Diagnosis not present

## 2014-01-07 DIAGNOSIS — I509 Heart failure, unspecified: Secondary | ICD-10-CM | POA: Diagnosis present

## 2014-01-07 DIAGNOSIS — E860 Dehydration: Secondary | ICD-10-CM | POA: Diagnosis present

## 2014-01-07 DIAGNOSIS — R1084 Generalized abdominal pain: Secondary | ICD-10-CM

## 2014-01-07 DIAGNOSIS — N186 End stage renal disease: Secondary | ICD-10-CM | POA: Diagnosis present

## 2014-01-07 DIAGNOSIS — I2510 Atherosclerotic heart disease of native coronary artery without angina pectoris: Secondary | ICD-10-CM | POA: Diagnosis present

## 2014-01-07 DIAGNOSIS — N189 Chronic kidney disease, unspecified: Secondary | ICD-10-CM

## 2014-01-07 DIAGNOSIS — J4489 Other specified chronic obstructive pulmonary disease: Secondary | ICD-10-CM | POA: Diagnosis present

## 2014-01-07 DIAGNOSIS — Z79899 Other long term (current) drug therapy: Secondary | ICD-10-CM | POA: Diagnosis not present

## 2014-01-07 DIAGNOSIS — J449 Chronic obstructive pulmonary disease, unspecified: Secondary | ICD-10-CM | POA: Diagnosis present

## 2014-01-07 DIAGNOSIS — G4733 Obstructive sleep apnea (adult) (pediatric): Secondary | ICD-10-CM | POA: Diagnosis present

## 2014-01-07 DIAGNOSIS — E1110 Type 2 diabetes mellitus with ketoacidosis without coma: Secondary | ICD-10-CM

## 2014-01-07 DIAGNOSIS — R109 Unspecified abdominal pain: Secondary | ICD-10-CM | POA: Diagnosis present

## 2014-01-07 DIAGNOSIS — R739 Hyperglycemia, unspecified: Secondary | ICD-10-CM | POA: Diagnosis present

## 2014-01-07 HISTORY — DX: Disorder of kidney and ureter, unspecified: N28.9

## 2014-01-07 LAB — COMPREHENSIVE METABOLIC PANEL
ALBUMIN: 3.5 g/dL (ref 3.5–5.2)
ALK PHOS: 144 U/L — AB (ref 39–117)
ALT: 22 U/L (ref 0–35)
ANION GAP: 25 — AB (ref 5–15)
AST: 39 U/L — ABNORMAL HIGH (ref 0–37)
BUN: 28 mg/dL — ABNORMAL HIGH (ref 6–23)
CO2: 20 mEq/L (ref 19–32)
Calcium: 9.8 mg/dL (ref 8.4–10.5)
Chloride: 91 mEq/L — ABNORMAL LOW (ref 96–112)
Creatinine, Ser: 2.69 mg/dL — ABNORMAL HIGH (ref 0.50–1.10)
GFR calc non Af Amer: 19 mL/min — ABNORMAL LOW (ref >90)
GFR, EST AFRICAN AMERICAN: 22 mL/min — AB (ref >90)
GLUCOSE: 425 mg/dL — AB (ref 70–99)
POTASSIUM: 4.4 meq/L (ref 3.7–5.3)
Sodium: 136 mEq/L — ABNORMAL LOW (ref 137–147)
TOTAL PROTEIN: 7.8 g/dL (ref 6.0–8.3)
Total Bilirubin: 0.4 mg/dL (ref 0.3–1.2)

## 2014-01-07 LAB — URINE MICROSCOPIC-ADD ON

## 2014-01-07 LAB — CBC WITH DIFFERENTIAL/PLATELET
BASOS PCT: 0 % (ref 0–1)
Basophils Absolute: 0 10*3/uL (ref 0.0–0.1)
EOS PCT: 1 % (ref 0–5)
Eosinophils Absolute: 0.1 10*3/uL (ref 0.0–0.7)
HEMATOCRIT: 37.6 % (ref 36.0–46.0)
Hemoglobin: 12.4 g/dL (ref 12.0–15.0)
Lymphocytes Relative: 21 % (ref 12–46)
Lymphs Abs: 1.9 10*3/uL (ref 0.7–4.0)
MCH: 29.2 pg (ref 26.0–34.0)
MCHC: 33 g/dL (ref 30.0–36.0)
MCV: 88.5 fL (ref 78.0–100.0)
MONO ABS: 0.6 10*3/uL (ref 0.1–1.0)
Monocytes Relative: 6 % (ref 3–12)
NEUTROS ABS: 6.8 10*3/uL (ref 1.7–7.7)
Neutrophils Relative %: 72 % (ref 43–77)
Platelets: 210 10*3/uL (ref 150–400)
RBC: 4.25 MIL/uL (ref 3.87–5.11)
RDW: 13.8 % (ref 11.5–15.5)
WBC: 9.4 10*3/uL (ref 4.0–10.5)

## 2014-01-07 LAB — GLUCOSE, CAPILLARY
GLUCOSE-CAPILLARY: 170 mg/dL — AB (ref 70–99)
GLUCOSE-CAPILLARY: 150 mg/dL — AB (ref 70–99)
GLUCOSE-CAPILLARY: 191 mg/dL — AB (ref 70–99)
Glucose-Capillary: 209 mg/dL — ABNORMAL HIGH (ref 70–99)
Glucose-Capillary: 137 mg/dL — ABNORMAL HIGH (ref 70–99)
Glucose-Capillary: 172 mg/dL — ABNORMAL HIGH (ref 70–99)

## 2014-01-07 LAB — I-STAT VENOUS BLOOD GAS, ED
Acid-Base Excess: 5 mmol/L — ABNORMAL HIGH (ref 0.0–2.0)
Bicarbonate: 25.7 mEq/L — ABNORMAL HIGH (ref 20.0–24.0)
O2 SAT: 97 %
TCO2: 27 mmol/L (ref 0–100)
pCO2, Ven: 27.6 mmHg — ABNORMAL LOW (ref 45.0–50.0)
pH, Ven: 7.577 — ABNORMAL HIGH (ref 7.250–7.300)
pO2, Ven: 75 mmHg — ABNORMAL HIGH (ref 30.0–45.0)

## 2014-01-07 LAB — URINALYSIS, ROUTINE W REFLEX MICROSCOPIC
Bilirubin Urine: NEGATIVE
Glucose, UA: >1000 mg/dL — AB
Ketones, ur: 15 mg/dL — AB
Nitrite: NEGATIVE
SPECIFIC GRAVITY, URINE: 1.026 (ref 1.005–1.030)
UROBILINOGEN UA: 0.2 mg/dL (ref 0.0–1.0)
pH: 7.5 (ref 5.0–8.0)

## 2014-01-07 LAB — CBG MONITORING, ED
GLUCOSE-CAPILLARY: 479 mg/dL — AB (ref 70–99)
GLUCOSE-CAPILLARY: 472 mg/dL — AB (ref 70–99)
Glucose-Capillary: 421 mg/dL — ABNORMAL HIGH (ref 70–99)

## 2014-01-07 LAB — KETONES, QUALITATIVE

## 2014-01-07 LAB — BASIC METABOLIC PANEL
Anion gap: 16 — ABNORMAL HIGH (ref 5–15)
BUN: 30 mg/dL — ABNORMAL HIGH (ref 6–23)
CO2: 27 mEq/L (ref 19–32)
Calcium: 9.7 mg/dL (ref 8.4–10.5)
Chloride: 97 mEq/L (ref 96–112)
Creatinine, Ser: 2.77 mg/dL — ABNORMAL HIGH (ref 0.50–1.10)
GFR calc Af Amer: 21 mL/min — ABNORMAL LOW (ref >90)
GFR, EST NON AFRICAN AMERICAN: 18 mL/min — AB (ref >90)
GLUCOSE: 201 mg/dL — AB (ref 70–99)
Potassium: 3.3 mEq/L — ABNORMAL LOW (ref 3.7–5.3)
SODIUM: 140 meq/L (ref 137–147)

## 2014-01-07 LAB — MRSA PCR SCREENING: MRSA by PCR: NEGATIVE

## 2014-01-07 LAB — I-STAT CG4 LACTIC ACID, ED: Lactic Acid, Venous: 2.57 mmol/L — ABNORMAL HIGH (ref 0.5–2.2)

## 2014-01-07 LAB — TROPONIN I

## 2014-01-07 LAB — LIPASE, BLOOD: LIPASE: 33 U/L (ref 11–59)

## 2014-01-07 MED ORDER — ASPIRIN EC 81 MG PO TBEC
81.0000 mg | DELAYED_RELEASE_TABLET | Freq: Every day | ORAL | Status: DC
  Administered 2014-01-08 – 2014-01-11 (×4): 81 mg via ORAL
  Filled 2014-01-07 (×4): qty 1

## 2014-01-07 MED ORDER — ISOSORBIDE MONONITRATE ER 60 MG PO TB24
120.0000 mg | ORAL_TABLET | Freq: Every day | ORAL | Status: DC
  Administered 2014-01-08 – 2014-01-11 (×3): 120 mg via ORAL
  Filled 2014-01-07 (×4): qty 2

## 2014-01-07 MED ORDER — ONDANSETRON HCL 4 MG PO TABS: 4.0000 mg | ORAL_TABLET | Freq: Four times a day (QID) | ORAL | Status: DC | PRN

## 2014-01-07 MED ORDER — HYDRALAZINE HCL 20 MG/ML IJ SOLN
10.0000 mg | Freq: Four times a day (QID) | INTRAMUSCULAR | Status: DC | PRN
  Administered 2014-01-07 – 2014-01-09 (×3): 10 mg via INTRAVENOUS
  Filled 2014-01-07 (×3): qty 1

## 2014-01-07 MED ORDER — AMLODIPINE BESYLATE 10 MG PO TABS
10.0000 mg | ORAL_TABLET | Freq: Every day | ORAL | Status: DC
  Administered 2014-01-08: 10 mg via ORAL
  Filled 2014-01-07 (×2): qty 1

## 2014-01-07 MED ORDER — PANTOPRAZOLE SODIUM 40 MG IV SOLR
40.0000 mg | Freq: Once | INTRAVENOUS | Status: AC
  Administered 2014-01-07: 40 mg via INTRAVENOUS
  Filled 2014-01-07 (×2): qty 40

## 2014-01-07 MED ORDER — MORPHINE SULFATE 4 MG/ML IJ SOLN
4.0000 mg | INTRAMUSCULAR | Status: DC | PRN
  Administered 2014-01-07 – 2014-01-08 (×7): 4 mg via INTRAVENOUS
  Filled 2014-01-07 (×7): qty 1

## 2014-01-07 MED ORDER — DEXTROSE-NACL 5-0.45 % IV SOLN: INTRAVENOUS | Status: DC

## 2014-01-07 MED ORDER — FERROUS SULFATE 325 (65 FE) MG PO TABS
325.0000 mg | ORAL_TABLET | Freq: Every day | ORAL | Status: DC
  Administered 2014-01-08: 325 mg via ORAL
  Filled 2014-01-07 (×2): qty 1

## 2014-01-07 MED ORDER — POTASSIUM CHLORIDE 10 MEQ/100ML IV SOLN
10.0000 meq | INTRAVENOUS | Status: AC
  Administered 2014-01-07 – 2014-01-08 (×2): 10 meq via INTRAVENOUS
  Filled 2014-01-07 (×2): qty 100

## 2014-01-07 MED ORDER — INSULIN REGULAR BOLUS VIA INFUSION
0.0000 [IU] | Freq: Three times a day (TID) | INTRAVENOUS | Status: DC
  Filled 2014-01-07: qty 10

## 2014-01-07 MED ORDER — HYDRALAZINE HCL 20 MG/ML IJ SOLN
10.0000 mg | INTRAMUSCULAR | Status: AC
  Administered 2014-01-07: 10 mg via INTRAVENOUS
  Filled 2014-01-07: qty 1

## 2014-01-07 MED ORDER — MORPHINE SULFATE 4 MG/ML IJ SOLN: 4.0000 mg | INTRAMUSCULAR | Status: DC | PRN

## 2014-01-07 MED ORDER — ATORVASTATIN CALCIUM 40 MG PO TABS
40.0000 mg | ORAL_TABLET | Freq: Every day | ORAL | Status: DC
  Administered 2014-01-08 – 2014-01-10 (×3): 40 mg via ORAL
  Filled 2014-01-07 (×4): qty 1

## 2014-01-07 MED ORDER — HYDRALAZINE HCL 50 MG PO TABS
100.0000 mg | ORAL_TABLET | Freq: Three times a day (TID) | ORAL | Status: DC
  Administered 2014-01-07 – 2014-01-11 (×9): 100 mg via ORAL
  Filled 2014-01-07 (×14): qty 2

## 2014-01-07 MED ORDER — SODIUM CHLORIDE 0.9 % IV SOLN: INTRAVENOUS | Status: DC

## 2014-01-07 MED ORDER — ACETAMINOPHEN 325 MG PO TABS
650.0000 mg | ORAL_TABLET | Freq: Four times a day (QID) | ORAL | Status: DC | PRN
  Administered 2014-01-10: 650 mg via ORAL

## 2014-01-07 MED ORDER — HYDRALAZINE HCL 100 MG PO TABS: 100.0000 mg | ORAL_TABLET | Freq: Three times a day (TID) | ORAL | Status: DC

## 2014-01-07 MED ORDER — SODIUM CHLORIDE 0.9 % IV SOLN
INTRAVENOUS | Status: DC
  Administered 2014-01-07: 4.2 [IU]/h via INTRAVENOUS
  Filled 2014-01-07: qty 2.5

## 2014-01-07 MED ORDER — PROMETHAZINE HCL 25 MG/ML IJ SOLN
25.0000 mg | Freq: Once | INTRAMUSCULAR | Status: AC
  Administered 2014-01-07: 25 mg via INTRAVENOUS
  Filled 2014-01-07: qty 1

## 2014-01-07 MED ORDER — CALCIUM ACETATE (PHOS BINDER) 667 MG/5ML PO SOLN
667.0000 mg | Freq: Three times a day (TID) | ORAL | Status: DC
  Administered 2014-01-08 – 2014-01-11 (×6): 667 mg via ORAL
  Filled 2014-01-07 (×15): qty 5

## 2014-01-07 MED ORDER — MORPHINE SULFATE 2 MG/ML IJ SOLN: 2.0000 mg | INTRAMUSCULAR | Status: DC | PRN

## 2014-01-07 MED ORDER — NITROGLYCERIN 0.4 MG SL SUBL
0.4000 mg | SUBLINGUAL_TABLET | SUBLINGUAL | Status: DC | PRN
  Administered 2014-01-09: 0.4 mg via SUBLINGUAL
  Filled 2014-01-07: qty 1

## 2014-01-07 MED ORDER — HEPARIN SODIUM (PORCINE) 5000 UNIT/ML IJ SOLN
5000.0000 [IU] | Freq: Three times a day (TID) | INTRAMUSCULAR | Status: DC
  Administered 2014-01-07 – 2014-01-11 (×12): 5000 [IU] via SUBCUTANEOUS
  Filled 2014-01-07 (×14): qty 1

## 2014-01-07 MED ORDER — CARVEDILOL 25 MG PO TABS
25.0000 mg | ORAL_TABLET | Freq: Two times a day (BID) | ORAL | Status: DC
  Administered 2014-01-08 – 2014-01-11 (×4): 25 mg via ORAL
  Filled 2014-01-07 (×10): qty 1

## 2014-01-07 MED ORDER — ACETAMINOPHEN 650 MG RE SUPP: 650.0000 mg | Freq: Four times a day (QID) | RECTAL | Status: DC | PRN

## 2014-01-07 MED ORDER — CLONIDINE HCL 0.1 MG PO TABS: 0.1000 mg | ORAL_TABLET | Freq: Two times a day (BID) | ORAL | Status: DC

## 2014-01-07 MED ORDER — DEXTROSE 50 % IV SOLN: 25.0000 mL | INTRAVENOUS | Status: DC | PRN

## 2014-01-07 MED ORDER — CALCITRIOL 0.25 MCG PO CAPS
0.2500 ug | ORAL_CAPSULE | Freq: Every day | ORAL | Status: DC
  Administered 2014-01-08: 0.25 ug via ORAL
  Filled 2014-01-07: qty 1

## 2014-01-07 MED ORDER — ONDANSETRON 4 MG PO TBDP: 8.0000 mg | ORAL_TABLET | Freq: Once | ORAL | Status: DC

## 2014-01-07 MED ORDER — ALBUTEROL SULFATE (2.5 MG/3ML) 0.083% IN NEBU: 2.5000 mg | INHALATION_SOLUTION | Freq: Four times a day (QID) | RESPIRATORY_TRACT | Status: DC | PRN

## 2014-01-07 MED ORDER — IOHEXOL 350 MG/ML SOLN
100.0000 mL | Freq: Once | INTRAVENOUS | Status: AC | PRN
  Administered 2014-01-07: 100 mL via INTRAVENOUS

## 2014-01-07 MED ORDER — CLONIDINE HCL 0.1 MG PO TABS
0.1000 mg | ORAL_TABLET | Freq: Every day | ORAL | Status: DC
  Administered 2014-01-08: 0.1 mg via ORAL
  Filled 2014-01-07 (×2): qty 1

## 2014-01-07 MED ORDER — ONDANSETRON HCL 4 MG/2ML IJ SOLN
4.0000 mg | Freq: Four times a day (QID) | INTRAMUSCULAR | Status: DC | PRN
  Administered 2014-01-07 – 2014-01-09 (×2): 4 mg via INTRAVENOUS
  Filled 2014-01-07 (×2): qty 2

## 2014-01-07 MED ORDER — PANTOPRAZOLE SODIUM 40 MG PO TBEC
40.0000 mg | DELAYED_RELEASE_TABLET | Freq: Two times a day (BID) | ORAL | Status: DC
  Administered 2014-01-08 (×2): 40 mg via ORAL
  Filled 2014-01-07 (×2): qty 1

## 2014-01-07 MED ORDER — CLONIDINE HCL 0.2 MG PO TABS
0.2000 mg | ORAL_TABLET | Freq: Every day | ORAL | Status: DC
  Administered 2014-01-07: 0.2 mg via ORAL
  Filled 2014-01-07 (×3): qty 1

## 2014-01-07 MED ORDER — MORPHINE SULFATE 4 MG/ML IJ SOLN
4.0000 mg | Freq: Once | INTRAMUSCULAR | Status: AC
  Administered 2014-01-07: 4 mg via INTRAVENOUS
  Filled 2014-01-07: qty 1

## 2014-01-07 MED ORDER — ALBUTEROL SULFATE HFA 108 (90 BASE) MCG/ACT IN AERS: 2.0000 | INHALATION_SPRAY | Freq: Four times a day (QID) | RESPIRATORY_TRACT | Status: DC | PRN

## 2014-01-07 NOTE — ED Notes (Signed)
Bladder scan performed x2 on patient. First reading was >722mL. Second reading was >673mL.

## 2014-01-07 NOTE — ED Notes (Signed)
Pt went to X-ray.   

## 2014-01-07 NOTE — H&P (Signed)
[facility] History and Physical  Madeline [name] ZOX:[id number] DOB: 1955-05-31 DOA: 01/07/2014  Referring physician:  PCP: Pamelia Hoit, MD  Specialists:   Chief Complaint: Abdominal pain, nausea, vomiting  HPI: Madeline [name] is a 58 y.o. female  With a history of end-stage renal disease, diabetes mellitus, hypertension, diastolic heart failure that presented to the emergency department for abdominal pain, nausea, vomiting. Patient's vomiting began on Wednesday this past week after dialyzing. Patient recently started dialyzing on Wednesday. Patient has been unable to keep anything down since Wednesday, and has not been taking her medications. Patient denies any recent ill contacts or travel. She denies any cough or fever or chills. Patient does complain of feeling tired and weak. He also complains of feeling the urge to urinate however unable to do so.  Upon arrival to emergency department, patient was found to have a glucose of 425, CO2 20, and was started on glucose stabilizer.  Her daughter at bedside, patient recently underwent tooth extraction on Tuesday. However was not taking any antibiotics post tooth extraction.  Review of Systems:  Constitutional: He poor appetite and generalized weakness. HEENT: Denies photophobia, eye pain, redness, hearing loss, ear pain, congestion, sore throat, rhinorrhea, sneezing, mouth sores, trouble swallowing, neck pain, neck stiffness and tinnitus.   Respiratory: Denies SOB, DOE, cough, chest tightness,  and wheezing.   Cardiovascular: Denies chest pain, palpitations and leg swelling.  Gastrointestinal: Complains of abdominal pain, nausea, vomiting. Genitourinary: Patient feels she is unable to really Musculoskeletal: Denies myalgias, back pain, joint swelling, arthralgias and gait problem.  Skin: Denies pallor, rash and wound.  Neurological: Denies dizziness, seizures, syncope, weakness, light-headedness, numbness and headaches.  Hematological:  Denies adenopathy. Easy bruising, personal or family bleeding history  Psychiatric/Behavioral: Denies suicidal ideation, mood changes, confusion, nervousness, sleep disturbance and agitation  Past Medical History  Diagnosis Date  . CHF (congestive heart failure)     a. HFpEF with RHF/anasarca/pHTN.  . COPD (chronic obstructive pulmonary disease)   . Coronary artery disease     a. Per [place] records: NSTEMI 01/2012, tx medically given ARF but suspected CAD. b. Stress test 12/16/11 reported w/o ischemia.  . Hypertension   . Diabetes mellitus   . OSA (obstructive sleep apnea)     a. Pt reported used to use CPAP in [place], but "ran out" when came to [facility].  Marland Kitchen CKD (chronic kidney disease), stage III     a. Per [place] records: h/o ARF after CTA that ruled out PE.  Marland Kitchen Pulmonary hypertension     a. [facility] 02/28/13: mod pulm HTN with normal PVR suggestive of predominantly pulmonary venous HTN.  . Right heart failure   . Anasarca     a. Per [place] records - due to pulm HTN with R HF.   Marland Kitchen Aseptic meningitis     a. 09/2012: adm in [place] for metabolic encephalopathy, oliguric tubular necrosis, anemia, HTN, possible CVA, HHNKA.  Marland Kitchen CVA (cerebral infarction)     a. Per [place] records, "possible CVA" 09/2012 but MRI reportedly negative.  . Abnormal Doppler ultrasound of carotid artery     a. Per [place] records: <50% LICA.  Marland Kitchen Renal insufficiency    Past Surgical History  Procedure Laterality Date  . Tubal ligation    . Abdominal hysterectomy    . Esophagogastroduodenoscopy N/A 02/16/2013    Procedure: ESOPHAGOGASTRODUODENOSCOPY (EGD);  Surgeon: Graylin Shiver, MD;  Location: [facility] ENDOSCOPY;  Service: Endoscopy;  Laterality: N/A;  . Insertion of dialysis catheter Right 03/03/2013  Procedure: INSERTION OF DIALYSIS CATHETER;  Surgeon: Larina Earthly, MD;  Location: [facility] OR;  Service: Vascular;  Laterality: Right;  Right Internal Jugular Placement  . Av fistula placement Left 03/03/2013    Procedure: ARTERIOVENOUS (AV) FISTULA CREATION,  Brachial/Cephalic;  Surgeon: Larina Earthly, MD;  Location: [facility] OR;  Service: Vascular;  Laterality: Left;   Social History:  reports that she has quit smoking. Her smokeless tobacco use includes Snuff and Chew. She reports that she does not drink alcohol or use illicit drugs.   No Known Allergies  Family History  Problem Relation Age of Onset  . Diabetes Mellitus II Sister   . Diabetes Mellitus II Brother   . CAD Brother    Prior to Admission medications   Medication Sig Start Date End Date Taking? Authorizing Provider  acetaminophen (TYLENOL) 500 MG tablet Take 500-1,000 mg by mouth every 6 (six) hours as needed for moderate pain.    Historical Provider, MD  albuterol (PROVENTIL HFA;VENTOLIN HFA) 108 (90 BASE) MCG/ACT inhaler Inhale 2 puffs into the lungs every 6 (six) hours as needed for wheezing or shortness of breath.     Historical Provider, MD  amLODipine (NORVASC) 10 MG tablet Take 10 mg by mouth daily.    Historical Provider, MD  aspirin EC 81 MG tablet Take 81 mg by mouth daily.    Historical Provider, MD  atorvastatin (LIPITOR) 40 MG tablet Take 40 mg by mouth daily.    Historical Provider, MD  calcitRIOL (ROCALTROL) 0.25 MCG capsule Take 0.25 mcg by mouth daily.    Historical Provider, MD  calcium acetate, Phos Binder, (PHOSLYRA) 667 MG/5ML SOLN Take 667 mg by mouth 3 (three) times daily with meals.    Historical Provider, MD  carvedilol (COREG) 25 MG tablet Take 25 mg by mouth 2 (two) times daily with a meal.    Historical Provider, MD  cloNIDine (CATAPRES) 0.1 MG tablet Take 0.1-0.2 mg by mouth 2 (two) times daily. Takes 0.1mg  every morning and 0.2mg  at bedtime    Historical Provider, MD  ferrous sulfate 325 (65 FE) MG tablet Take 325 mg by mouth daily with breakfast.    Historical Provider, MD  furosemide (LASIX) 80 MG tablet Take 80 mg by mouth 2 (two) times daily.    Historical Provider, MD  gabapentin (NEURONTIN) 300 MG capsule Take 300 mg by mouth 2 (two) times daily.      Historical Provider, MD  hydrALAZINE (APRESOLINE) 100 MG tablet Take 100 mg by mouth 3 (three) times daily.    Historical Provider, MD  insulin glargine (LANTUS) 100 UNIT/ML injection Inject 10 Units into the skin at bedtime.    Historical Provider, MD  insulin lispro (HUMALOG) 100 UNIT/ML injection Inject 3-10 Units into the skin 3 (three) times daily before meals. Sliding scale.  CBG 200: 3 UNITS, 250 5 UNITS, 300 7 UNITS, 350 9-10 UNITS    Historical Provider, MD  isosorbide mononitrate (IMDUR) 120 MG 24 hr tablet Take 120 mg by mouth daily.    Historical Provider, MD  metolazone (ZAROXOLYN) 5 MG tablet Take 5 mg by mouth 2 (two) times daily.    Historical Provider, MD  nitroGLYCERIN (NITROSTAT) 0.4 MG SL tablet Place 0.4 mg under the tongue every 5 (five) minutes as needed for chest pain.     Historical Provider, MD  pantoprazole (PROTONIX) 40 MG tablet Take 1 tablet (40 mg total) by mouth 2 (two) times daily. 06/05/13   Windell Hummingbird, MD  polyethylene glycol [facility] /  GLYCOLAX) packet Take 17 g by mouth daily as needed (for constipation). 03/05/13   Osvaldo Shipper, MD  promethazine (PHENERGAN) 25 MG tablet Take 25 mg by mouth every 6 (six) hours as needed for nausea or vomiting.    Historical Provider, MD  promethazine (PHENERGAN) 25 MG tablet Take 1 tablet (25 mg total) by mouth every 6 (six) hours as needed for nausea or vomiting. 12/21/13   Tatyana A Kirichenko, PA-C  traMADol (ULTRAM) 50 MG tablet Take 50 mg by mouth 3 (three) times daily as needed for pain.    Historical Provider, MD   Physical Exam: Filed Vitals:   01/07/14 1330  BP: 205/78  Pulse: 78  Temp:   Resp: 24     General: Well developed, well nourished, moderate distress, appears stated age  HEENT: NCAT, PERRLA, EOMI, Anicteic Sclera, mucous membranes moist.   Neck: Supple, no JVD, no masses  Cardiovascular: S1 S2 auscultated, no rubs, murmurs or gallops. Regular rate and rhythm.  Respiratory: Clear to  auscultation bilaterally with equal chest rise  Abdomen: Soft, diffusely tender, nondistended, + bowel sounds  Extremities: warm dry without cyanosis clubbing or edema  Neuro: AAOx3, cranial nerves grossly intact. Strength equal and bilateral in the upper and lower extremities  Skin: Without rashes exudates or nodules  Psych: Appropriate affect and demeanor  Labs on Admission:  Basic Metabolic Panel:  Recent Labs Lab 01/07/14 1000  NA 136*  K 4.4  CL 91*  CO2 20  GLUCOSE 425*  BUN 28*  CREATININE 2.69*  CALCIUM 9.8   Liver Function Tests:  Recent Labs Lab 01/07/14 1000  AST 39*  ALT 22  ALKPHOS 144*  BILITOT 0.4  PROT 7.8  ALBUMIN 3.5    Recent Labs Lab 01/07/14 1000  LIPASE 33   No results found for this basename: AMMONIA,  in the last 168 hours CBC:  Recent Labs Lab 01/07/14 1000  WBC 9.4  NEUTROABS 6.8  HGB 12.4  HCT 37.6  MCV 88.5  PLT 210   Cardiac Enzymes:  Recent Labs Lab 01/07/14 0922  TROPONINI <0.30    BNP (last 3 results)  Recent Labs  02/22/13 0709 02/26/13 0546 03/15/13 1530  PROBNP 5090.0* 7568.0* 8133.0*   CBG:  Recent Labs Lab 01/07/14 1148 01/07/14 1320  GLUCAP 479* 472*    Radiological Exams on Admission: Dg Chest 2 View  01/07/2014   CLINICAL DATA:  Right-sided chest pain  EXAM: CHEST  2 VIEW  COMPARISON:  12/21/1948  FINDINGS: Cardiac shadow remains mildly enlarged. The lungs are clear bilaterally. No focal infiltrate or sizable effusion is noted. No acute bony abnormality is noted.  IMPRESSION: No acute abnormality seen.   Electronically Signed   By: Alcide Clever M.D.   On: 01/07/2014 13:38   Ct Angio Chest Aorta W/cm &/or Wo/cm  01/07/2014   CLINICAL DATA:  Pain.  Evaluate for dissection.  EXAM: CT ANGIOGRAPHY CHEST, ABDOMEN AND PELVIS  TECHNIQUE: Multidetector CT imaging through the chest, abdomen and pelvis was performed using the standard protocol during bolus administration of intravenous contrast.  Multiplanar reconstructed images and MIPs were obtained and reviewed to evaluate the vascular anatomy.  CONTRAST:  OMNIPAQUE IOHEXOL 350 MG/ML SOLN  COMPARISON:  CT 06/03/2013 and 02/13/2013 .  FINDINGS: CTA CHEST FINDINGS  Thoracic aortic atherosclerotic vascular disease. No aneurysm or dissection. Pulmonary arteries are normal. Coronary artery disease. Cardiomegaly. No significant adenopathy. The thoracic esophagus is dilated and contains fluid. Sliding hiatal hernia is present. Gastroesophageal reflux  may be present. Further evaluation with nonemergent barium swallow can be obtained.  Large airways patent. Mild basilar atelectasis. No focal infiltrate. No pleural effusion or pneumothorax.  No acute thoracic bony abnormality identified. Visualized thyroid normal. No axillary adenopathy.  Review of the MIP images confirms the above findings.  CTA ABDOMEN AND PELVIS FINDINGS  Abdominal aorta is normal in caliber. No evidence of abdominal aortic aneurysm or dissection. Aortoiliac atherosclerotic vascular disease present. Visceral vessels are patent.  Liver normal. Spleen normal. Pancreas normal. Gallbladder is nondistended. Common bile duct upper limits normal at 7 mm.  Adrenals normal. No focal renal abnormality. No hydronephrosis or obstructing ureteral stone. Bladder slightly distended. Hysterectomy. No pelvic mass. Minimal amount of free pelvic fluid.  Appendix normal. No bowel distention. No free air. No mesenteric mass. Tiny inguinal hernias. Tiny umbilical hernia. No bowel herniation.  No acute bony abnormality identified. Degenerative changes lumbar spine. Diffuse mild anasarca.  Review of the MIP images confirms the above findings.  IMPRESSION: 1. Thoracic and abdominal aorta unremarkable. No evidence of aneurysm or dissection. No pulmonary embolus. 2. Coronary artery disease.  Cardiomegaly. 3. Sliding hiatal hernia with esophageal dilatation and fluid within the esophagus. Gastroesophageal reflux  could present in this fashion. Nonemergent barium swallow can be obtained to further evaluate. 4. Mild bladder distention. 5. Mild amount free pelvic fluid.  Mild anasarca.   Electronically Signed   By: Maisie Fus  Register   On: 01/07/2014 13:18   Ct Cta Abd/pel W/cm &/or W/o Cm  01/07/2014   CLINICAL DATA:  Pain.  Evaluate for dissection.  EXAM: CT ANGIOGRAPHY CHEST, ABDOMEN AND PELVIS  TECHNIQUE: Multidetector CT imaging through the chest, abdomen and pelvis was performed using the standard protocol during bolus administration of intravenous contrast. Multiplanar reconstructed images and MIPs were obtained and reviewed to evaluate the vascular anatomy.  CONTRAST:  OMNIPAQUE IOHEXOL 350 MG/ML SOLN  COMPARISON:  CT 06/03/2013 and 02/13/2013 .  FINDINGS: CTA CHEST FINDINGS  Thoracic aortic atherosclerotic vascular disease. No aneurysm or dissection. Pulmonary arteries are normal. Coronary artery disease. Cardiomegaly. No significant adenopathy. The thoracic esophagus is dilated and contains fluid. Sliding hiatal hernia is present. Gastroesophageal reflux may be present. Further evaluation with nonemergent barium swallow can be obtained.  Large airways patent. Mild basilar atelectasis. No focal infiltrate. No pleural effusion or pneumothorax.  No acute thoracic bony abnormality identified. Visualized thyroid normal. No axillary adenopathy.  Review of the MIP images confirms the above findings.  CTA ABDOMEN AND PELVIS FINDINGS  Abdominal aorta is normal in caliber. No evidence of abdominal aortic aneurysm or dissection. Aortoiliac atherosclerotic vascular disease present. Visceral vessels are patent.  Liver normal. Spleen normal. Pancreas normal. Gallbladder is nondistended. Common bile duct upper limits normal at 7 mm.  Adrenals normal. No focal renal abnormality. No hydronephrosis or obstructing ureteral stone. Bladder slightly distended. Hysterectomy. No pelvic mass. Minimal amount of free pelvic fluid.   Appendix normal. No bowel distention. No free air. No mesenteric mass. Tiny inguinal hernias. Tiny umbilical hernia. No bowel herniation.  No acute bony abnormality identified. Degenerative changes lumbar spine. Diffuse mild anasarca.  Review of the MIP images confirms the above findings.  IMPRESSION: 1. Thoracic and abdominal aorta unremarkable. No evidence of aneurysm or dissection. No pulmonary embolus. 2. Coronary artery disease.  Cardiomegaly. 3. Sliding hiatal hernia with esophageal dilatation and fluid within the esophagus. Gastroesophageal reflux could present in this fashion. Nonemergent barium swallow can be obtained to further evaluate. 4. Mild bladder distention. 5. Mild  amount free pelvic fluid.  Mild anasarca.   Electronically Signed   By: Maisie Fus  Register   On: 01/07/2014 13:18    EKG: Independently reviewed. Sinus rhythm, rate 80  Assessment/Plan  Diabetic Ketoacidosis/increased anion gap metabolic acidosis -Patient will be admitted to step down unit -Will place patient glucose stabilizer -Will refrain from giving fluids as patient has history of diastolic heart failure as well as end-stage renal disease. -Will continue to monitor her BMP every 2 hours as well as her CBGs hourly. -Will place potassium as needed. -Patient likely went to DKA secondary to viral gastroenteritis versus newly starting hemodialysis.  Patient has been vomiting since Wednesday, and not taking her medications. -Will obtain hemoglobin A1c -Chest x-ray: No acute abnormality -Patient is afebrile no leukocytosis -Will obtain a UA and blood cultures. -Will place patient on anti-emetics. -Patient will be made n.p.o.  Abdominal pain with nausea and vomiting -CT of the abdomen and pelvis: Thoracic and abdominal aorta are unremarkable, no evidence of aneurysm or dissection, no pulmonary embolism, mild bladder distention, mild anasarca -Place patient on antiemetics as well as pain medications as needed. -Likely  secondary to viral gastroenteritis, patient currently has no white count and is afebrile -Denies any diarrhea at this time  End-stage renal disease on hemodialysis -Will consult nephrology. -Patient dialyzes on Monday, Wednesday, Friday.  Accelerated hypertension -Likely secondary to not taking her home medications -Continue amlodipine, Coreg, clonidine, hydralazine -Will hold Lasix and metolazone -Placed on IV hydralazine as needed for systolic blood pressures above 180  Hyperlipidemia -Continue statin  Anemia secondary to chronic disease -Continue ferrous sulfate -Continue to monitor CBC  Chronic diastolic heart failure -Echocardiogram in October 2014 shows an EF of 65%, diastolic dysfunction. -Will monitor patient's daily weights, intake and output. -Will refrain from providing IV fluids at this time.  GERD -Continue Protonix  DVT prophylaxis: Heparin  Code Status: Full  Condition: Guarded  Family Communication: Daughter. Admission, patients condition and plan of care including tests being ordered have been discussed with the patient and daughter who indicate understanding and agree with the plan and Code Status.  Disposition Plan: Admitted  Time spent: 60 minutes  Madeline [name] D.O. [facility] Pager [phone]  If 7PM-7AM, please contact night-coverage [email or web address] Password [email or web address] 01/07/2014, 2:08 PM

## 2014-01-07 NOTE — ED Provider Notes (Signed)
Medical screening examination/treatment/procedure(s) were conducted as a shared visit with non-physician practitioner(s) or resident and myself. I personally evaluated the patient during the encounter and agree with the findings.  I have personally reviewed any xrays and/ or EKG's with the provider and I agree with interpretation.  Patient with complicated medical history including lung disease, heart failure, diabetes, high blood pressure, stroke, ulcer presents with intermittent epigastric pain radiating to back with gradually worsening intermittent nausea and vomiting the past 4 days. Patient recently started dialysis in last dialyzed on Friday. Patient feels this may be similar to her ulcer however she feels so generally unwell difficult to discern. No focal chest pain or shortness of breath. Nothing is improved her symptoms. Patient has not been ill the keep down her blood pressure medications. On exam patient has mild dehydration, actively vomiting in the room, epigastric tenderness to palpation no guarding, regular rate and rhythm, no leg pain posteriorly, distal pulses intact. Discussed differential including ulcer/gastritis/pancreatitis/gallbladder related versus atypical cardiac versus dissection. Bedside ultrasound difficult to 2 pain however no wall thickening and no gallstones visualized. CT angina chest and abdomen ordered with cardiac screen and bloodwork, pain meds. Likely plan for observation the hospital depending on results.  EMERGENCY DEPARTMENT BILIARY ULTRASOUND INTERPRETATION  "Study: Limited Abdominal Ultrasound of the gallbladder and common bile duct."  INDICATIONS: Abdominal pain, Nausea and Vomiting  Indication: Multiple views of the gallbladder and common bile duct were obtained in real-time with a Multi-frequency probe."  PERFORMED BY: Myself  IMAGES ARCHIVED?: Yes  FINDINGS: Gallstones absent, Gallbladder wall normal in thickness and Sonographic Murphy's sign absent   LIMITATIONS: Body Habitus, Bowel Gas and Abdominal pain  INTERPRETATION: No wall thickening or stones or free fluid   Enid Skeens, MD 01/07/14 1735

## 2014-01-07 NOTE — Progress Notes (Signed)
RN notified of need for new IV for CT Angio

## 2014-01-07 NOTE — ED Provider Notes (Signed)
CSN: [id number]     Arrival date & time 01/07/14  0906 History   First MD Initiated Contact with Patient 01/07/14 [phone]     Chief Complaint  Patient presents with  . Abdominal Pain     (Consider location/radiation/quality/duration/timing/severity/associated sxs/prior Treatment) HPI Comments: Patient is a 58 yo F PMHx significant for CHF, COPD, CAD, DM, HTN, ESRD on dialysis MWF (last went Friday) presenting to the ED for one week of RUQ abdominal pain with associated nausea and non-bloody non-bilious emesis. Patient endorses associated chills, but no documented fever. Patient has been unable to tolerate her at home medications d/t nausea and vomiting. Denies any diarrhea. Patient's abdominal surgical history includes hysterectomy. Patient makes very little urine.  Patient is a 58 y.o. female presenting with abdominal pain.  Abdominal Pain Associated symptoms: chills, nausea and vomiting   Associated symptoms: no cough, no diarrhea, no fever and no shortness of breath     Past Medical History  Diagnosis Date  . CHF (congestive heart failure)     a. HFpEF with RHF/anasarca/pHTN.  . COPD (chronic obstructive pulmonary disease)   . Coronary artery disease     a. Per [place] records: NSTEMI 01/2012, tx medically given ARF but suspected CAD. b. Stress test 12/16/11 reported w/o ischemia.  . Hypertension   . Diabetes mellitus   . OSA (obstructive sleep apnea)     a. Pt reported used to use CPAP in [place], but "ran out" when came to [facility].  Marland Kitchen CKD (chronic kidney disease), stage III     a. Per [place] records: h/o ARF after CTA that ruled out PE.  Marland Kitchen Pulmonary hypertension     a. [facility] 02/28/13: mod pulm HTN with normal PVR suggestive of predominantly pulmonary venous HTN.  . Right heart failure   . Anasarca     a. Per [place] records - due to pulm HTN with R HF.   Marland Kitchen Aseptic meningitis     a. 09/2012: adm in [place] for metabolic encephalopathy, oliguric tubular necrosis, anemia, HTN, possible CVA, HHNKA.  Marland Kitchen CVA (cerebral  infarction)     a. Per [place] records, "possible CVA" 09/2012 but MRI reportedly negative.  . Abnormal Doppler ultrasound of carotid artery     a. Per  records: <50% LICA.  Marland Kitchen Renal insufficiency    Past Surgical History  Procedure Laterality Date  . Tubal ligation    . Abdominal hysterectomy    . Esophagogastroduodenoscopy N/A 02/16/2013    Procedure: ESOPHAGOGASTRODUODENOSCOPY (EGD);  Surgeon: Graylin Shiver, MD;  Location: [facility] ENDOSCOPY;  Service: Endoscopy;  Laterality: N/A;  . Insertion of dialysis catheter Right 03/03/2013    Procedure: INSERTION OF DIALYSIS CATHETER;  Surgeon: Larina Earthly, MD;  Location: [facility] OR;  Service: Vascular;  Laterality: Right;  Right Internal Jugular Placement  . Av fistula placement Left 03/03/2013    Procedure: ARTERIOVENOUS (AV) FISTULA CREATION, Brachial/Cephalic;  Surgeon: Larina Earthly, MD;  Location: [facility] OR;  Service: Vascular;  Laterality: Left;   Family History  Problem Relation Age of Onset  . Diabetes Mellitus II Sister   . Diabetes Mellitus II Brother   . CAD Brother    History  Substance Use Topics  . Smoking status: Former Games developer  . Smokeless tobacco: Current User    Types: Snuff, Chew  . Alcohol Use: No   OB History   Grav Para Term Preterm Abortions TAB SAB Ect Mult Living  Review of Systems  Constitutional: Positive for chills. Negative for fever.  Respiratory: Negative for cough and shortness of breath.   Gastrointestinal: Positive for nausea, vomiting and abdominal pain. Negative for diarrhea.      Allergies  Review of patient's allergies indicates no known allergies.  Home Medications   Prior to Admission medications   Medication Sig Start Date End Date Taking? Authorizing Provider  acetaminophen (TYLENOL) 500 MG tablet Take 500-1,000 mg by mouth every 6 (six) hours as needed for moderate pain.    Historical Provider, MD  albuterol (PROVENTIL HFA;VENTOLIN HFA) 108 (90 BASE) MCG/ACT inhaler Inhale 2 puffs into  the lungs every 6 (six) hours as needed for wheezing or shortness of breath.     Historical Provider, MD  amLODipine (NORVASC) 10 MG tablet Take 10 mg by mouth daily.    Historical Provider, MD  aspirin EC 81 MG tablet Take 81 mg by mouth daily.    Historical Provider, MD  atorvastatin (LIPITOR) 40 MG tablet Take 40 mg by mouth daily.    Historical Provider, MD  calcitRIOL (ROCALTROL) 0.25 MCG capsule Take 0.25 mcg by mouth daily.    Historical Provider, MD  calcium acetate, Phos Binder, (PHOSLYRA) 667 MG/5ML SOLN Take 667 mg by mouth 3 (three) times daily with meals.    Historical Provider, MD  carvedilol (COREG) 25 MG tablet Take 25 mg by mouth 2 (two) times daily with a meal.    Historical Provider, MD  cloNIDine (CATAPRES) 0.1 MG tablet Take 0.1-0.2 mg by mouth 2 (two) times daily. Takes 0.1mg  every morning and 0.2mg  at bedtime    Historical Provider, MD  ferrous sulfate 325 (65 FE) MG tablet Take 325 mg by mouth daily with breakfast.    Historical Provider, MD  furosemide (LASIX) 80 MG tablet Take 80 mg by mouth 2 (two) times daily.    Historical Provider, MD  gabapentin (NEURONTIN) 300 MG capsule Take 300 mg by mouth 2 (two) times daily.     Historical Provider, MD  hydrALAZINE (APRESOLINE) 100 MG tablet Take 100 mg by mouth 3 (three) times daily.    Historical Provider, MD  insulin glargine (LANTUS) 100 UNIT/ML injection Inject 10 Units into the skin at bedtime.    Historical Provider, MD  insulin lispro (HUMALOG) 100 UNIT/ML injection Inject 3-10 Units into the skin 3 (three) times daily before meals. Sliding scale.  CBG 200: 3 UNITS, 250 5 UNITS, 300 7 UNITS, 350 9-10 UNITS    Historical Provider, MD  isosorbide mononitrate (IMDUR) 120 MG 24 hr tablet Take 120 mg by mouth daily.    Historical Provider, MD  metolazone (ZAROXOLYN) 5 MG tablet Take 5 mg by mouth 2 (two) times daily.    Historical Provider, MD  nitroGLYCERIN (NITROSTAT) 0.4 MG SL tablet Place 0.4 mg under the tongue every 5  (five) minutes as needed for chest pain.     Historical Provider, MD  pantoprazole (PROTONIX) 40 MG tablet Take 1 tablet (40 mg total) by mouth 2 (two) times daily. 06/05/13   Windell Hummingbird, MD  polyethylene glycol [facility] / Ethelene Hal) packet Take 17 g by mouth daily as needed (for constipation). 03/05/13   Osvaldo Shipper, MD  promethazine (PHENERGAN) 25 MG tablet Take 25 mg by mouth every 6 (six) hours as needed for nausea or vomiting.    Historical Provider, MD  promethazine (PHENERGAN) 25 MG tablet Take 1 tablet (25 mg total) by mouth every 6 (six) hours as needed for nausea or vomiting. 12/21/13  Tatyana A Kirichenko, PA-C  traMADol (ULTRAM) 50 MG tablet Take 50 mg by mouth 3 (three) times daily as needed for pain.    Historical Provider, MD   BP 203/91  Pulse 82  Temp(Src) 98.9 F (37.2 C)  Resp 19  Ht  (1.448 m)  Wt 131 lb (59.421 kg)  BMI 28.34 kg/m2  SpO2 100% Physical Exam  Constitutional: She is oriented to person, place, and time. She appears well-developed and well-nourished. She appears distressed.  HENT:  Head: Normocephalic and atraumatic.  Right Ear: External ear normal.  Left Ear: External ear normal.  Nose: Nose normal.  Mouth/Throat: Oropharynx is clear and moist. No oropharyngeal exudate.  Eyes: Conjunctivae are normal.  Neck: Neck supple.  Cardiovascular: Normal rate, regular rhythm, normal heart sounds and intact distal pulses.   Pulmonary/Chest: Breath sounds normal. No accessory muscle usage. Tachypnea noted. No respiratory distress.  Abdominal: Soft. Normal appearance and bowel sounds are normal. She exhibits no distension. There is tenderness in the right upper quadrant. There is no rigidity, no rebound and no guarding.  Musculoskeletal: She exhibits no edema.  Neurological: She is alert and oriented to person, place, and time.  Skin: Skin is warm and dry. She is not diaphoretic.    ED Course  Procedures (including critical care time) Medications   dextrose 5 %-0.45 % sodium chloride infusion (not administered)  insulin regular (NOVOLIN R,HUMULIN R) 250 Units in sodium chloride 0.9 % 250 mL (1 Units/mL) infusion (8.2 Units/hr Intravenous Rate/Dose Change 01/07/14 1323)  morphine 4 MG/ML injection 4 mg (4 mg Intravenous Given 01/07/14 1137)  morphine 4 MG/ML injection 4 mg (4 mg Intravenous Given 01/07/14 1018)  promethazine (PHENERGAN) injection 25 mg (25 mg Intravenous Given 01/07/14 1018)  hydrALAZINE (APRESOLINE) injection 10 mg (10 mg Intravenous Given 01/07/14 1017)  iohexol (OMNIPAQUE) 350 MG/ML injection 100 mL (100 mLs Intravenous Contrast Given 01/07/14 1237)    Labs Review Labs Reviewed  COMPREHENSIVE METABOLIC PANEL - Abnormal; Notable for the following:    Sodium 136 (*)    Chloride 91 (*)    Glucose, Bld 425 (*)    BUN 28 (*)    Creatinine, Ser 2.69 (*)    AST 39 (*)    Alkaline Phosphatase 144 (*)    GFR calc non Af Amer 19 (*)    GFR calc Af Amer 22 (*)    Anion gap 25 (*)    All other components within normal limits  I-STAT CG4 LACTIC ACID, ED - Abnormal; Notable for the following:    Lactic Acid, Venous 2.57 (*)    All other components within normal limits  CBG MONITORING, ED - Abnormal; Notable for the following:    Glucose-Capillary 479 (*)    All other components within normal limits  CBG MONITORING, ED - Abnormal; Notable for the following:    Glucose-Capillary 472 (*)    All other components within normal limits  I-STAT VENOUS BLOOD GAS, ED - Abnormal; Notable for the following:    pH, Ven 7.577 (*)    pCO2, Ven 27.6 (*)    pO2, Ven 75.0 (*)    Bicarbonate 25.7 (*)    Acid-Base Excess 5.0 (*)    All other components within normal limits  TROPONIN I  CBC WITH DIFFERENTIAL  LIPASE, BLOOD  BLOOD GAS, VENOUS  KETONES, QUALITATIVE    Imaging Review Dg Chest 2 View  01/07/2014   CLINICAL DATA:  Right-sided chest pain  EXAM: CHEST  2 VIEW  COMPARISON:  12/21/1948  FINDINGS: Cardiac shadow remains  mildly enlarged. The lungs are clear bilaterally. No focal infiltrate or sizable effusion is noted. No acute bony abnormality is noted.  IMPRESSION: No acute abnormality seen.   Electronically Signed   By: Alcide Clever M.D.   On: 01/07/2014 13:38   Ct Angio Chest Aorta W/cm &/or Wo/cm  01/07/2014   CLINICAL DATA:  Pain.  Evaluate for dissection.  EXAM: CT ANGIOGRAPHY CHEST, ABDOMEN AND PELVIS  TECHNIQUE: Multidetector CT imaging through the chest, abdomen and pelvis was performed using the standard protocol during bolus administration of intravenous contrast. Multiplanar reconstructed images and MIPs were obtained and reviewed to evaluate the vascular anatomy.  CONTRAST:  OMNIPAQUE IOHEXOL 350 MG/ML SOLN  COMPARISON:  CT 06/03/2013 and 02/13/2013 .  FINDINGS: CTA CHEST FINDINGS  Thoracic aortic atherosclerotic vascular disease. No aneurysm or dissection. Pulmonary arteries are normal. Coronary artery disease. Cardiomegaly. No significant adenopathy. The thoracic esophagus is dilated and contains fluid. Sliding hiatal hernia is present. Gastroesophageal reflux may be present. Further evaluation with nonemergent barium swallow can be obtained.  Large airways patent. Mild basilar atelectasis. No focal infiltrate. No pleural effusion or pneumothorax.  No acute thoracic bony abnormality identified. Visualized thyroid normal. No axillary adenopathy.  Review of the MIP images confirms the above findings.  CTA ABDOMEN AND PELVIS FINDINGS  Abdominal aorta is normal in caliber. No evidence of abdominal aortic aneurysm or dissection. Aortoiliac atherosclerotic vascular disease present. Visceral vessels are patent.  Liver normal. Spleen normal. Pancreas normal. Gallbladder is nondistended. Common bile duct upper limits normal at 7 mm.  Adrenals normal. No focal renal abnormality. No hydronephrosis or obstructing ureteral stone. Bladder slightly distended. Hysterectomy. No pelvic mass. Minimal amount of free pelvic  fluid.  Appendix normal. No bowel distention. No free air. No mesenteric mass. Tiny inguinal hernias. Tiny umbilical hernia. No bowel herniation.  No acute bony abnormality identified. Degenerative changes lumbar spine. Diffuse mild anasarca.  Review of the MIP images confirms the above findings.  IMPRESSION: 1. Thoracic and abdominal aorta unremarkable. No evidence of aneurysm or dissection. No pulmonary embolus. 2. Coronary artery disease.  Cardiomegaly. 3. Sliding hiatal hernia with esophageal dilatation and fluid within the esophagus. Gastroesophageal reflux could present in this fashion. Nonemergent barium swallow can be obtained to further evaluate. 4. Mild bladder distention. 5. Mild amount free pelvic fluid.  Mild anasarca.   Electronically Signed   By: Maisie Fus  Register   On: 01/07/2014 13:18   Ct Cta Abd/pel W/cm &/or W/o Cm  01/07/2014   CLINICAL DATA:  Pain.  Evaluate for dissection.  EXAM: CT ANGIOGRAPHY CHEST, ABDOMEN AND PELVIS  TECHNIQUE: Multidetector CT imaging through the chest, abdomen and pelvis was performed using the standard protocol during bolus administration of intravenous contrast. Multiplanar reconstructed images and MIPs were obtained and reviewed to evaluate the vascular anatomy.  CONTRAST:  OMNIPAQUE IOHEXOL 350 MG/ML SOLN  COMPARISON:  CT 06/03/2013 and 02/13/2013 .  FINDINGS: CTA CHEST FINDINGS  Thoracic aortic atherosclerotic vascular disease. No aneurysm or dissection. Pulmonary arteries are normal. Coronary artery disease. Cardiomegaly. No significant adenopathy. The thoracic esophagus is dilated and contains fluid. Sliding hiatal hernia is present. Gastroesophageal reflux may be present. Further evaluation with nonemergent barium swallow can be obtained.  Large airways patent. Mild basilar atelectasis. No focal infiltrate. No pleural effusion or pneumothorax.  No acute thoracic bony abnormality identified. Visualized thyroid normal. No axillary adenopathy.  Review of the  MIP images confirms the above findings.  CTA ABDOMEN AND PELVIS FINDINGS  Abdominal aorta is normal in caliber. No evidence of abdominal aortic aneurysm or dissection. Aortoiliac atherosclerotic vascular disease present. Visceral vessels are patent.  Liver normal. Spleen normal. Pancreas normal. Gallbladder is nondistended. Common bile duct upper limits normal at 7 mm.  Adrenals normal. No focal renal abnormality. No hydronephrosis or obstructing ureteral stone. Bladder slightly distended. Hysterectomy. No pelvic mass. Minimal amount of free pelvic fluid.  Appendix normal. No bowel distention. No free air. No mesenteric mass. Tiny inguinal hernias. Tiny umbilical hernia. No bowel herniation.  No acute bony abnormality identified. Degenerative changes lumbar spine. Diffuse mild anasarca.  Review of the MIP images confirms the above findings.  IMPRESSION: 1. Thoracic and abdominal aorta unremarkable. No evidence of aneurysm or dissection. No pulmonary embolus. 2. Coronary artery disease.  Cardiomegaly. 3. Sliding hiatal hernia with esophageal dilatation and fluid within the esophagus. Gastroesophageal reflux could present in this fashion. Nonemergent barium swallow can be obtained to further evaluate. 4. Mild bladder distention. 5. Mild amount free pelvic fluid.  Mild anasarca.   Electronically Signed   By: Maisie Fus  Register   On: 01/07/2014 13:18     EKG Interpretation None      CRITICAL CARE Performed by: Francee Piccolo L   Total critical care time: 35 minutes  Critical care time was exclusive of separately billable procedures and treating other patients.  Critical care was necessary to treat or prevent imminent or life-threatening deterioration.  Critical care was time spent personally by me on the following activities: development of treatment plan with patient and/or surrogate as well as nursing, discussions with consultants, evaluation of patient's response to treatment, examination of  patient, obtaining history from patient or surrogate, ordering and performing treatments and interventions, ordering and review of laboratory studies, ordering and review of radiographic studies, pulse oximetry and re-evaluation of patient's condition.  MDM   Final diagnoses:  Diabetic ketoacidosis without coma associated with type 2 diabetes mellitus    Filed Vitals:   01/07/14 1400  BP: 203/91  Pulse: 82  Temp:   Resp: 19   Patient with DKA after one week of nausea, vomiting with abdominal pain with by mouth intolerance. Abdomen soft diffusely tender , no peritoneal signs. CT chest and abdomen unremarkable. Labs reviewed. Anion gap of 25 noted. Glucostabilizer protocol placed. Patient will be admitted to the hospital for further management and evaluation. Patient d/w with Dr. Jodi Mourning, agrees with plan.      Jeannetta Ellis, PA-C 01/07/14 1656

## 2014-01-07 NOTE — ED Notes (Signed)
Attempted report 

## 2014-01-07 NOTE — ED Notes (Signed)
Pt sts she has had abd pain and n/v since Monday. Pt c/o of chills. Pt denies diarrhea. Pt has recently started dialysis.

## 2014-01-07 NOTE — ED Notes (Signed)
CBG is 472. Notified Nurse Lanora Manis.

## 2014-01-07 NOTE — Progress Notes (Signed)
Nursing Txt paged [facility], notified for need to change po meds to IV and potassium level 3.3.  Awaiting orders.

## 2014-01-07 NOTE — ED Notes (Signed)
First EKG was missing lead 6. Second EKG has all leads.

## 2014-01-07 NOTE — ED Notes (Signed)
Lactic Acid 2.57 mmol/L  Copy given to PA Piepenbrink

## 2014-01-07 NOTE — ED Notes (Signed)
CT notified about IV access

## 2014-01-08 DIAGNOSIS — K279 Peptic ulcer, site unspecified, unspecified as acute or chronic, without hemorrhage or perforation: Secondary | ICD-10-CM

## 2014-01-08 LAB — BASIC METABOLIC PANEL
Anion gap: 15 (ref 5–15)
Anion gap: 16 — ABNORMAL HIGH (ref 5–15)
Anion gap: 16 — ABNORMAL HIGH (ref 5–15)
Anion gap: 17 — ABNORMAL HIGH (ref 5–15)
Anion gap: 16 — ABNORMAL HIGH (ref 5–15)
Anion gap: 15 (ref 5–15)
Anion gap: 13 (ref 5–15)
BUN: 31 mg/dL — ABNORMAL HIGH (ref 6–23)
BUN: 32 mg/dL — ABNORMAL HIGH (ref 6–23)
BUN: 32 mg/dL — AB (ref 6–23)
BUN: 33 mg/dL — ABNORMAL HIGH (ref 6–23)
BUN: 34 mg/dL — ABNORMAL HIGH (ref 6–23)
BUN: 36 mg/dL — ABNORMAL HIGH (ref 6–23)
BUN: 30 mg/dL — AB (ref 6–23)
CALCIUM: 9 mg/dL (ref 8.4–10.5)
CO2: 25 mEq/L (ref 19–32)
CO2: 24 mEq/L (ref 19–32)
CO2: 24 meq/L (ref 19–32)
CO2: 24 mEq/L (ref 19–32)
CO2: 24 mEq/L (ref 19–32)
CO2: 25 mEq/L (ref 19–32)
CO2: 28 mEq/L (ref 19–32)
CREATININE: 2.95 mg/dL — AB (ref 0.50–1.10)
Calcium: 9.8 mg/dL (ref 8.4–10.5)
Calcium: 9.7 mg/dL (ref 8.4–10.5)
Calcium: 9.8 mg/dL (ref 8.4–10.5)
Calcium: 9.5 mg/dL (ref 8.4–10.5)
Calcium: 9.6 mg/dL (ref 8.4–10.5)
Calcium: 9.6 mg/dL (ref 8.4–10.5)
Chloride: 99 mEq/L (ref 96–112)
Chloride: 100 mEq/L (ref 96–112)
Chloride: 101 mEq/L (ref 96–112)
Chloride: 99 mEq/L (ref 96–112)
Chloride: 102 mEq/L (ref 96–112)
Chloride: 101 mEq/L (ref 96–112)
Chloride: 103 mEq/L (ref 96–112)
Creatinine, Ser: 2.79 mg/dL — ABNORMAL HIGH (ref 0.50–1.10)
Creatinine, Ser: 2.9 mg/dL — ABNORMAL HIGH (ref 0.50–1.10)
Creatinine, Ser: 3.16 mg/dL — ABNORMAL HIGH (ref 0.50–1.10)
Creatinine, Ser: 3.39 mg/dL — ABNORMAL HIGH (ref 0.50–1.10)
Creatinine, Ser: 3.62 mg/dL — ABNORMAL HIGH (ref 0.50–1.10)
Creatinine, Ser: 3.71 mg/dL — ABNORMAL HIGH (ref 0.50–1.10)
GFR calc Af Amer: 21 mL/min — ABNORMAL LOW (ref >90)
GFR calc Af Amer: 20 mL/min — ABNORMAL LOW (ref >90)
GFR calc Af Amer: 19 mL/min — ABNORMAL LOW (ref >90)
GFR calc Af Amer: 18 mL/min — ABNORMAL LOW (ref >90)
GFR calc Af Amer: 16 mL/min — ABNORMAL LOW (ref >90)
GFR calc Af Amer: 15 mL/min — ABNORMAL LOW (ref >90)
GFR calc non Af Amer: 18 mL/min — ABNORMAL LOW (ref >90)
GFR calc non Af Amer: 17 mL/min — ABNORMAL LOW (ref >90)
GFR calc non Af Amer: 17 mL/min — ABNORMAL LOW (ref >90)
GFR calc non Af Amer: 15 mL/min — ABNORMAL LOW (ref >90)
GFR calc non Af Amer: 14 mL/min — ABNORMAL LOW (ref >90)
GFR calc non Af Amer: 13 mL/min — ABNORMAL LOW (ref >90)
GFR calc non Af Amer: 13 mL/min — ABNORMAL LOW (ref >90)
GFR, EST AFRICAN AMERICAN: 15 mL/min — AB (ref >90)
Glucose, Bld: 199 mg/dL — ABNORMAL HIGH (ref 70–99)
Glucose, Bld: 140 mg/dL — ABNORMAL HIGH (ref 70–99)
Glucose, Bld: 132 mg/dL — ABNORMAL HIGH (ref 70–99)
Glucose, Bld: 191 mg/dL — ABNORMAL HIGH (ref 70–99)
Glucose, Bld: 181 mg/dL — ABNORMAL HIGH (ref 70–99)
Glucose, Bld: 134 mg/dL — ABNORMAL HIGH (ref 70–99)
Glucose, Bld: 93 mg/dL (ref 70–99)
POTASSIUM: 4.8 meq/L (ref 3.7–5.3)
Potassium: 3.5 mEq/L — ABNORMAL LOW (ref 3.7–5.3)
Potassium: 3.9 mEq/L (ref 3.7–5.3)
Potassium: 4 mEq/L (ref 3.7–5.3)
Potassium: 3.9 mEq/L (ref 3.7–5.3)
Potassium: 3.9 mEq/L (ref 3.7–5.3)
Potassium: 3.7 mEq/L (ref 3.7–5.3)
Sodium: 139 mEq/L (ref 137–147)
Sodium: 140 mEq/L (ref 137–147)
Sodium: 141 mEq/L (ref 137–147)
Sodium: 140 mEq/L (ref 137–147)
Sodium: 142 mEq/L (ref 137–147)
Sodium: 141 mEq/L (ref 137–147)
Sodium: 144 mEq/L (ref 137–147)

## 2014-01-08 LAB — GLUCOSE, CAPILLARY
GLUCOSE-CAPILLARY: 161 mg/dL — AB (ref 70–99)
GLUCOSE-CAPILLARY: 191 mg/dL — AB (ref 70–99)
GLUCOSE-CAPILLARY: 215 mg/dL — AB (ref 70–99)
GLUCOSE-CAPILLARY: 86 mg/dL (ref 70–99)
Glucose-Capillary: 114 mg/dL — ABNORMAL HIGH (ref 70–99)
Glucose-Capillary: 120 mg/dL — ABNORMAL HIGH (ref 70–99)
Glucose-Capillary: 124 mg/dL — ABNORMAL HIGH (ref 70–99)
Glucose-Capillary: 131 mg/dL — ABNORMAL HIGH (ref 70–99)
Glucose-Capillary: 135 mg/dL — ABNORMAL HIGH (ref 70–99)
Glucose-Capillary: 135 mg/dL — ABNORMAL HIGH (ref 70–99)
Glucose-Capillary: 135 mg/dL — ABNORMAL HIGH (ref 70–99)
Glucose-Capillary: 138 mg/dL — ABNORMAL HIGH (ref 70–99)
Glucose-Capillary: 143 mg/dL — ABNORMAL HIGH (ref 70–99)
Glucose-Capillary: 145 mg/dL — ABNORMAL HIGH (ref 70–99)
Glucose-Capillary: 146 mg/dL — ABNORMAL HIGH (ref 70–99)
Glucose-Capillary: 157 mg/dL — ABNORMAL HIGH (ref 70–99)
Glucose-Capillary: 167 mg/dL — ABNORMAL HIGH (ref 70–99)
Glucose-Capillary: 168 mg/dL — ABNORMAL HIGH (ref 70–99)
Glucose-Capillary: 189 mg/dL — ABNORMAL HIGH (ref 70–99)
Glucose-Capillary: 196 mg/dL — ABNORMAL HIGH (ref 70–99)
Glucose-Capillary: 202 mg/dL — ABNORMAL HIGH (ref 70–99)
Glucose-Capillary: 239 mg/dL — ABNORMAL HIGH (ref 70–99)
Glucose-Capillary: 287 mg/dL — ABNORMAL HIGH (ref 70–99)
Glucose-Capillary: 99 mg/dL (ref 70–99)

## 2014-01-08 LAB — CBC
HEMATOCRIT: 36.8 % (ref 36.0–46.0)
Hemoglobin: 12.3 g/dL (ref 12.0–15.0)
MCH: 28.8 pg (ref 26.0–34.0)
MCHC: 33.4 g/dL (ref 30.0–36.0)
MCV: 86.2 fL (ref 78.0–100.0)
Platelets: 202 10*3/uL (ref 150–400)
RBC: 4.27 MIL/uL (ref 3.87–5.11)
RDW: 13.8 % (ref 11.5–15.5)
WBC: 11.6 10*3/uL — ABNORMAL HIGH (ref 4.0–10.5)

## 2014-01-08 LAB — LIPASE, BLOOD: Lipase: 20 U/L (ref 11–59)

## 2014-01-08 LAB — AMYLASE: Amylase: 69 U/L (ref 0–105)

## 2014-01-08 LAB — HEMOGLOBIN A1C
Hgb A1c MFr Bld: 12.3 % — ABNORMAL HIGH (ref <5.7)
Mean Plasma Glucose: 306 mg/dL — ABNORMAL HIGH (ref <117)

## 2014-01-08 MED ORDER — DOXERCALCIFEROL 4 MCG/2ML IV SOLN
2.0000 ug | INTRAVENOUS | Status: DC
  Administered 2014-01-10: 2 ug via INTRAVENOUS
  Filled 2014-01-08 (×2): qty 2

## 2014-01-08 MED ORDER — MORPHINE SULFATE 2 MG/ML IJ SOLN
2.0000 mg | INTRAMUSCULAR | Status: DC | PRN
  Administered 2014-01-08 – 2014-01-11 (×5): 2 mg via INTRAVENOUS
  Filled 2014-01-08 (×5): qty 1

## 2014-01-08 MED ORDER — NEPRO/CARBSTEADY PO LIQD
237.0000 mL | ORAL | Status: DC | PRN
  Filled 2014-01-08: qty 237

## 2014-01-08 MED ORDER — PENTAFLUOROPROP-TETRAFLUOROETH EX AERO
INHALATION_SPRAY | CUTANEOUS | Status: AC
  Filled 2014-01-08: qty 103.5

## 2014-01-08 MED ORDER — LIDOCAINE HCL (PF) 1 % IJ SOLN: 5.0000 mL | INTRAMUSCULAR | Status: DC | PRN

## 2014-01-08 MED ORDER — PENTAFLUOROPROP-TETRAFLUOROETH EX AERO: 1.0000 "application " | INHALATION_SPRAY | CUTANEOUS | Status: DC | PRN

## 2014-01-08 MED ORDER — PROMETHAZINE HCL 25 MG/ML IJ SOLN
12.5000 mg | INTRAMUSCULAR | Status: DC | PRN
  Administered 2014-01-09: 12.5 mg via INTRAVENOUS
  Filled 2014-01-08 (×2): qty 1

## 2014-01-08 MED ORDER — SODIUM CHLORIDE 0.9 % IV SOLN
125.0000 mg | INTRAVENOUS | Status: DC
  Administered 2014-01-08 – 2014-01-10 (×2): 125 mg via INTRAVENOUS
  Filled 2014-01-08 (×4): qty 10

## 2014-01-08 MED ORDER — SODIUM CHLORIDE 0.9 % IV SOLN: 100.0000 mL | INTRAVENOUS | Status: DC | PRN

## 2014-01-08 MED ORDER — LIDOCAINE-PRILOCAINE 2.5-2.5 % EX CREA: 1.0000 "application " | TOPICAL_CREAM | CUTANEOUS | Status: DC | PRN

## 2014-01-08 MED ORDER — DEXTROSE 50 % IV SOLN: 50.0000 mL | Freq: Once | INTRAVENOUS | Status: AC | PRN

## 2014-01-08 MED ORDER — PNEUMOCOCCAL VAC POLYVALENT 25 MCG/0.5ML IJ INJ: 0.5000 mL | INJECTION | INTRAMUSCULAR | Status: DC | PRN

## 2014-01-08 MED ORDER — GLUCOSE 40 % PO GEL: 1.0000 | ORAL | Status: DC | PRN

## 2014-01-08 MED ORDER — INSULIN ASPART 100 UNIT/ML ~~LOC~~ SOLN
0.0000 [IU] | Freq: Three times a day (TID) | SUBCUTANEOUS | Status: DC
  Administered 2014-01-09: 3 [IU] via SUBCUTANEOUS
  Administered 2014-01-09: 2 [IU] via SUBCUTANEOUS
  Administered 2014-01-09: 5 [IU] via SUBCUTANEOUS
  Administered 2014-01-10: 1 [IU] via SUBCUTANEOUS
  Administered 2014-01-10: 2 [IU] via SUBCUTANEOUS
  Administered 2014-01-11: 7 [IU] via SUBCUTANEOUS
  Administered 2014-01-11: 3 [IU] via SUBCUTANEOUS

## 2014-01-08 MED ORDER — MORPHINE SULFATE 2 MG/ML IJ SOLN
1.0000 mg | Freq: Once | INTRAMUSCULAR | Status: AC
  Administered 2014-01-08: 1 mg via INTRAVENOUS

## 2014-01-08 MED ORDER — ALTEPLASE 2 MG IJ SOLR
2.0000 mg | Freq: Once | INTRAMUSCULAR | Status: DC | PRN
  Filled 2014-01-08: qty 2

## 2014-01-08 MED ORDER — HEPARIN SODIUM (PORCINE) 1000 UNIT/ML DIALYSIS: 1800.0000 [IU] | Freq: Once | INTRAMUSCULAR | Status: DC

## 2014-01-08 MED ORDER — CLOTRIMAZOLE 10 MG MT TROC
10.0000 mg | Freq: Every day | OROMUCOSAL | Status: DC
  Administered 2014-01-08 – 2014-01-11 (×14): 10 mg via ORAL
  Filled 2014-01-08 (×18): qty 1

## 2014-01-08 MED ORDER — HEPARIN SODIUM (PORCINE) 1000 UNIT/ML DIALYSIS: 1000.0000 [IU] | INTRAMUSCULAR | Status: DC | PRN

## 2014-01-08 MED ORDER — MORPHINE SULFATE 2 MG/ML IJ SOLN
INTRAMUSCULAR | Status: AC
  Administered 2014-01-08: 1 mg via INTRAVENOUS
  Filled 2014-01-08: qty 1

## 2014-01-08 MED ORDER — RENA-VITE PO TABS
1.0000 | ORAL_TABLET | Freq: Every day | ORAL | Status: DC
  Administered 2014-01-08 – 2014-01-10 (×3): 1 via ORAL
  Filled 2014-01-08 (×4): qty 1

## 2014-01-08 MED ORDER — DEXTROSE 50 % IV SOLN: 25.0000 mL | Freq: Once | INTRAVENOUS | Status: AC | PRN

## 2014-01-08 MED ORDER — INSULIN GLARGINE 100 UNIT/ML ~~LOC~~ SOLN
10.0000 [IU] | Freq: Every day | SUBCUTANEOUS | Status: DC
  Administered 2014-01-09 – 2014-01-10 (×3): 10 [IU] via SUBCUTANEOUS
  Filled 2014-01-08 (×6): qty 0.1

## 2014-01-08 MED ORDER — INSULIN ASPART 100 UNIT/ML ~~LOC~~ SOLN: 0.0000 [IU] | Freq: Every day | SUBCUTANEOUS | Status: DC

## 2014-01-08 MED ORDER — MORPHINE SULFATE 4 MG/ML IJ SOLN: 2.0000 mg | INTRAMUSCULAR | Status: DC | PRN

## 2014-01-08 NOTE — Progress Notes (Signed)
Floor nurse Raymon Mutton called to advise to change insulin drip to 1.0 due to glucose recording of 157

## 2014-01-08 NOTE — Progress Notes (Signed)
Utilization review completed.  

## 2014-01-08 NOTE — Progress Notes (Signed)
Patient is unable to wear her CPAP at this time due to nausea and severe abdominal pain. RT will attempt to place patient on CPAP tonight if she can tolerate it.

## 2014-01-08 NOTE — Consult Note (Signed)
Indication for Consultation:  Management of ESRD/hemodialysis; anemia, hypertension/volume and secondary hyperparathyroidism  HPI: Madeline [name] is a 58 y.o. female who presented to the ED yesterday with complaints of abdominal pain, nausea and vomiting since Wednesday. She receives HD MWF at [place], history of DM, HTN and diastolic HF, she has only had 2 treatments, which she tolerated well. Per daughter she has not been able to keep anything down since Wednesday. Last Tuesday she had a tooth extraction, she did receive prophylactic antibiotic per daughter.  Will arrange HD today.  Past Medical History  Diagnosis Date  . CHF (congestive heart failure)     a. HFpEF with RHF/anasarca/pHTN.  . COPD (chronic obstructive pulmonary disease)   . Coronary artery disease     a. Per [place] records: NSTEMI 01/2012, tx medically given ARF but suspected CAD. b. Stress test 12/16/11 reported w/o ischemia.  . Hypertension   . Diabetes mellitus   . OSA (obstructive sleep apnea)     a. Pt reported used to use CPAP in [place], but "ran out" when came to [facility].  Marland Kitchen CKD (chronic kidney disease), stage III     a. Per [place] records: h/o ARF after CTA that ruled out PE.  Marland Kitchen Pulmonary hypertension     a. RHC 02/28/13: mod pulm HTN with normal PVR suggestive of predominantly pulmonary venous HTN.  . Right heart failure   . Anasarca     a. Per [place] records - due to pulm HTN with R HF.   Marland Kitchen Aseptic meningitis     a. 09/2012: adm in [place] for metabolic encephalopathy, oliguric tubular necrosis, anemia, HTN, possible CVA, HHNKA.  Marland Kitchen CVA (cerebral infarction)     a. Per [place] records, "possible CVA" 09/2012 but MRI reportedly negative.  . Abnormal Doppler ultrasound of carotid artery     a. Per [place] records: <50% LICA.  Marland Kitchen Renal insufficiency    Past Surgical History  Procedure Laterality Date  . Tubal ligation    . Abdominal hysterectomy    . Esophagogastroduodenoscopy N/A 02/16/2013    Procedure: ESOPHAGOGASTRODUODENOSCOPY (EGD);  Surgeon: Graylin Shiver, MD;  Location: [facility] ENDOSCOPY;  Service: Endoscopy;  Laterality: N/A;  . Insertion of dialysis catheter Right 03/03/2013    Procedure: INSERTION OF DIALYSIS CATHETER;  Surgeon: Larina Earthly, MD;  Location: [facility] OR;  Service: Vascular;  Laterality: Right;  Right Internal Jugular Placement  . Av fistula placement Left 03/03/2013    Procedure: ARTERIOVENOUS (AV) FISTULA CREATION, Brachial/Cephalic;  Surgeon: Larina Earthly, MD;  Location: [facility] OR;  Service: Vascular;  Laterality: Left;   Family History  Problem Relation Age of Onset  . Diabetes Mellitus II Sister   . Diabetes Mellitus II Brother   . CAD Brother    Social History:  reports that she has quit smoking. Her smokeless tobacco use includes Snuff and Chew. She reports that she does not drink alcohol or use illicit drugs. No Known Allergies Prior to Admission medications   Medication Sig Start Date End Date Taking? Authorizing Provider  acetaminophen (TYLENOL) 500 MG tablet Take 500-1,000 mg by mouth every 6 (six) hours as needed for moderate pain.   Yes Historical Provider, MD  albuterol (PROVENTIL HFA;VENTOLIN HFA) 108 (90 BASE) MCG/ACT inhaler Inhale 2 puffs into the lungs every 6 (six) hours as needed for wheezing or shortness of breath.    Yes Historical Provider, MD  amLODipine (NORVASC) 10 MG tablet Take 10 mg by mouth 2 (two) times daily.  Yes Historical Provider, MD  aspirin EC 81 MG tablet Take 81 mg by mouth daily.   Yes Historical Provider, MD  atorvastatin (LIPITOR) 40 MG tablet Take 40 mg by mouth daily.    Historical Provider, MD  calcitRIOL (ROCALTROL) 0.25 MCG capsule Take 0.25 mcg by mouth daily.    Historical Provider, MD  calcium acetate, Phos Binder, (PHOSLYRA) 667 MG/5ML SOLN Take 667 mg by mouth 3 (three) times daily with meals.    Historical Provider, MD  carvedilol (COREG) 25 MG tablet Take 25 mg by mouth 2 (two) times daily with a meal.    Historical Provider, MD  cloNIDine (CATAPRES) 0.1 MG tablet Take  0.1-0.2 mg by mouth 2 (two) times daily. Takes 0.1mg  every morning and 0.2mg  at bedtime    Historical Provider, MD  ferrous sulfate 325 (65 FE) MG tablet Take 325 mg by mouth daily with breakfast.    Historical Provider, MD  furosemide (LASIX) 80 MG tablet Take 80 mg by mouth 2 (two) times daily.    Historical Provider, MD  gabapentin (NEURONTIN) 300 MG capsule Take 300 mg by mouth 2 (two) times daily.     Historical Provider, MD  hydrALAZINE (APRESOLINE) 100 MG tablet Take 100 mg by mouth 3 (three) times daily.    Historical Provider, MD  insulin glargine (LANTUS) 100 UNIT/ML injection Inject 10 Units into the skin at bedtime.    Historical Provider, MD  insulin lispro (HUMALOG) 100 UNIT/ML injection Inject 3-10 Units into the skin 3 (three) times daily before meals. Sliding scale.  CBG 200: 3 UNITS, 250 5 UNITS, 300 7 UNITS, 350 9-10 UNITS    Historical Provider, MD  isosorbide mononitrate (IMDUR) 120 MG 24 hr tablet Take 120 mg by mouth daily.    Historical Provider, MD  metolazone (ZAROXOLYN) 5 MG tablet Take 5 mg by mouth 2 (two) times daily.    Historical Provider, MD  nitroGLYCERIN (NITROSTAT) 0.4 MG SL tablet Place 0.4 mg under the tongue every 5 (five) minutes as needed for chest pain.     Historical Provider, MD  pantoprazole (PROTONIX) 40 MG tablet Take 1 tablet (40 mg total) by mouth 2 (two) times daily. 06/05/13   Windell Hummingbird, MD  polyethylene glycol [facility] / Ethelene Hal) packet Take 17 g by mouth daily as needed (for constipation). 03/05/13   Osvaldo Shipper, MD  promethazine (PHENERGAN) 25 MG tablet Take 25 mg by mouth every 6 (six) hours as needed for nausea or vomiting.    Historical Provider, MD  promethazine (PHENERGAN) 25 MG tablet Take 1 tablet (25 mg total) by mouth every 6 (six) hours as needed for nausea or vomiting. 12/21/13   Tatyana A Kirichenko, PA-C  traMADol (ULTRAM) 50 MG tablet Take 50 mg by mouth 3 (three) times daily as needed for pain.    Historical Provider, MD    Current Facility-Administered Medications  Medication Dose Route Frequency Provider Last Rate Last Dose  . acetaminophen (TYLENOL) tablet 650 mg  650 mg Oral Q6H PRN Maryann Mikhail, DO       Or  . acetaminophen (TYLENOL) suppository 650 mg  650 mg Rectal Q6H PRN Maryann Mikhail, DO      . albuterol (PROVENTIL) (2.5 MG/3ML) 0.083% nebulizer solution 2.5 mg  2.5 mg Nebulization Q6H PRN Maryann Mikhail, DO      . amLODipine (NORVASC) tablet 10 mg  10 mg Oral Daily Maryann Mikhail, DO   10 mg at 01/08/14 0947  . aspirin EC tablet 81 mg  81 mg Oral Daily Maryann Mikhail, DO   81 mg at 01/08/14 1610  . atorvastatin (LIPITOR) tablet 40 mg  40 mg Oral q1800 Maryann Mikhail, DO      . calcitRIOL (ROCALTROL) capsule 0.25 mcg  0.25 mcg Oral Daily Maryann Mikhail, DO   0.25 mcg at 01/08/14 0947  . calcium acetate (Phos Binder) (PHOSLYRA) 667 MG/5ML oral solution 667 mg  667 mg Oral TID WC Maryann Mikhail, DO   667 mg at 01/08/14 0807  . carvedilol (COREG) tablet 25 mg  25 mg Oral BID WC Maryann Mikhail, DO   25 mg at 01/08/14 0818  . cloNIDine (CATAPRES) tablet 0.1 mg  0.1 mg Oral Daily Maryann Mikhail, DO   0.1 mg at 01/08/14 0947  . cloNIDine (CATAPRES) tablet 0.2 mg  0.2 mg Oral QHS Maryann Mikhail, DO   0.2 mg at 01/07/14 2156  . dextrose 50 % solution 25 mL  25 mL Intravenous PRN Maryann Mikhail, DO      . ferrous sulfate tablet 325 mg  325 mg Oral Q breakfast Maryann Mikhail, DO   325 mg at 01/08/14 0806  . heparin injection 5,000 Units  5,000 Units Subcutaneous 3 times per day Edsel Petrin, DO   5,000 Units at 01/08/14 0507  . hydrALAZINE (APRESOLINE) injection 10 mg  10 mg Intravenous Q6H PRN Maryann Mikhail, DO   10 mg at 01/08/14 0506  . hydrALAZINE (APRESOLINE) tablet 100 mg  100 mg Oral 3 times per day Edsel Petrin, DO   100 mg at 01/07/14 2156  . insulin regular (NOVOLIN R,HUMULIN R) 250 Units in sodium chloride 0.9 % 250 mL (1 Units/mL) infusion   Intravenous Continuous Jennifer L  Piepenbrink, PA-C 3 mL/hr at 01/08/14 1030 3 Units/hr at 01/08/14 1030  . insulin regular bolus via infusion 0-10 Units  0-10 Units Intravenous TID WC Maryann Mikhail, DO      . isosorbide mononitrate (IMDUR) 24 hr tablet 120 mg  120 mg Oral Daily Maryann Mikhail, DO   120 mg at 01/08/14 0947  . morphine 4 MG/ML injection 2 mg  2 mg Intravenous Q4H PRN Maryann Mikhail, DO      . nitroGLYCERIN (NITROSTAT) SL tablet 0.4 mg  0.4 mg Sublingual Q5 min PRN Maryann Mikhail, DO      . ondansetron (ZOFRAN) tablet 4 mg  4 mg Oral Q6H PRN Maryann Mikhail, DO       Or  . ondansetron (ZOFRAN) injection 4 mg  4 mg Intravenous Q6H PRN Maryann Mikhail, DO   4 mg at 01/07/14 2018  . pantoprazole (PROTONIX) EC tablet 40 mg  40 mg Oral BID Maryann Mikhail, DO   40 mg at 01/08/14 0947  . [START ON 01/09/2014] pneumococcal 23 valent vaccine (PNU-IMMUNE) injection 0.5 mL  0.5 mL Intramuscular Prior to discharge Maryann Mikhail, DO      . promethazine (PHENERGAN) injection 12.5 mg  12.5 mg Intravenous Q4H PRN Edsel Petrin, DO       Labs: Basic Metabolic Panel:  Recent Labs Lab 01/08/14 0345 01/08/14 0537 01/08/14 0900  NA 141 140 142  K 4.0 3.9 3.9  CL 101 99 102  CO2 GLUCOSE 132* 191* 181*  BUN 32* 33* 34*  CREATININE 2.95* 3.16* 3.39*  CALCIUM 9.8 9.5 9.6   Liver Function Tests:  Recent Labs Lab 01/07/14 1000  AST 39*  ALT 22  ALKPHOS 144*  BILITOT 0.4  PROT 7.8  ALBUMIN 3.5    Recent Labs  Lab 01/07/14 1000  LIPASE 33   No results found for this basename: AMMONIA,  in the last 168 hours CBC:  Recent Labs Lab 01/07/14 1000 01/08/14 0345  WBC 9.4 11.6*  NEUTROABS 6.8  --   HGB 12.4 12.3  HCT 37.6 36.8  MCV 88.5 86.2  PLT 210 202   Cardiac Enzymes:  Recent Labs Lab 01/07/14 0922  TROPONINI <0.30   CBG:  Recent Labs Lab 01/08/14 0558 01/08/14 0700 01/08/14 0802 01/08/14 0916 01/08/14 1029  GLUCAP 215* 239* 196* 167* 161*   Iron Studies: No results  found for this basename: IRON, TIBC, TRANSFERRIN, FERRITIN,  in the last 72 hours Studies/Results: Dg Chest 2 View  01/07/2014   CLINICAL DATA:  Right-sided chest pain  EXAM: CHEST  2 VIEW  COMPARISON:  12/21/1948  FINDINGS: Cardiac shadow remains mildly enlarged. The lungs are clear bilaterally. No focal infiltrate or sizable effusion is noted. No acute bony abnormality is noted.  IMPRESSION: No acute abnormality seen.   Electronically Signed   By: Alcide Clever M.D.   On: 01/07/2014 13:38   Ct Angio Chest Aorta W/cm &/or Wo/cm  01/07/2014   CLINICAL DATA:  Pain.  Evaluate for dissection.  EXAM: CT ANGIOGRAPHY CHEST, ABDOMEN AND PELVIS  TECHNIQUE: Multidetector CT imaging through the chest, abdomen and pelvis was performed using the standard protocol during bolus administration of intravenous contrast. Multiplanar reconstructed images and MIPs were obtained and reviewed to evaluate the vascular anatomy.  CONTRAST:  OMNIPAQUE IOHEXOL 350 MG/ML SOLN  COMPARISON:  CT 06/03/2013 and 02/13/2013 .  FINDINGS: CTA CHEST FINDINGS  Thoracic aortic atherosclerotic vascular disease. No aneurysm or dissection. Pulmonary arteries are normal. Coronary artery disease. Cardiomegaly. No significant adenopathy. The thoracic esophagus is dilated and contains fluid. Sliding hiatal hernia is present. Gastroesophageal reflux may be present. Further evaluation with nonemergent barium swallow can be obtained.  Large airways patent. Mild basilar atelectasis. No focal infiltrate. No pleural effusion or pneumothorax.  No acute thoracic bony abnormality identified. Visualized thyroid normal. No axillary adenopathy.  Review of the MIP images confirms the above findings.  CTA ABDOMEN AND PELVIS FINDINGS  Abdominal aorta is normal in caliber. No evidence of abdominal aortic aneurysm or dissection. Aortoiliac atherosclerotic vascular disease present. Visceral vessels are patent.  Liver normal. Spleen normal. Pancreas normal. Gallbladder  is nondistended. Common bile duct upper limits normal at 7 mm.  Adrenals normal. No focal renal abnormality. No hydronephrosis or obstructing ureteral stone. Bladder slightly distended. Hysterectomy. No pelvic mass. Minimal amount of free pelvic fluid.  Appendix normal. No bowel distention. No free air. No mesenteric mass. Tiny inguinal hernias. Tiny umbilical hernia. No bowel herniation.  No acute bony abnormality identified. Degenerative changes lumbar spine. Diffuse mild anasarca.  Review of the MIP images confirms the above findings.  IMPRESSION: 1. Thoracic and abdominal aorta unremarkable. No evidence of aneurysm or dissection. No pulmonary embolus. 2. Coronary artery disease.  Cardiomegaly. 3. Sliding hiatal hernia with esophageal dilatation and fluid within the esophagus. Gastroesophageal reflux could present in this fashion. Nonemergent barium swallow can be obtained to further evaluate. 4. Mild bladder distention. 5. Mild amount free pelvic fluid.  Mild anasarca.   Electronically Signed   By: Maisie Fus  Register   On: 01/07/2014 13:18   Ct Cta Abd/pel W/cm &/or W/o Cm  01/07/2014   CLINICAL DATA:  Pain.  Evaluate for dissection.  EXAM: CT ANGIOGRAPHY CHEST, ABDOMEN AND PELVIS  TECHNIQUE: Multidetector CT imaging through the chest, abdomen and pelvis  was performed using the standard protocol during bolus administration of intravenous contrast. Multiplanar reconstructed images and MIPs were obtained and reviewed to evaluate the vascular anatomy.  CONTRAST:  OMNIPAQUE IOHEXOL 350 MG/ML SOLN  COMPARISON:  CT 06/03/2013 and 02/13/2013 .  FINDINGS: CTA CHEST FINDINGS  Thoracic aortic atherosclerotic vascular disease. No aneurysm or dissection. Pulmonary arteries are normal. Coronary artery disease. Cardiomegaly. No significant adenopathy. The thoracic esophagus is dilated and contains fluid. Sliding hiatal hernia is present. Gastroesophageal reflux may be present. Further evaluation with nonemergent barium  swallow can be obtained.  Large airways patent. Mild basilar atelectasis. No focal infiltrate. No pleural effusion or pneumothorax.  No acute thoracic bony abnormality identified. Visualized thyroid normal. No axillary adenopathy.  Review of the MIP images confirms the above findings.  CTA ABDOMEN AND PELVIS FINDINGS  Abdominal aorta is normal in caliber. No evidence of abdominal aortic aneurysm or dissection. Aortoiliac atherosclerotic vascular disease present. Visceral vessels are patent.  Liver normal. Spleen normal. Pancreas normal. Gallbladder is nondistended. Common bile duct upper limits normal at 7 mm.  Adrenals normal. No focal renal abnormality. No hydronephrosis or obstructing ureteral stone. Bladder slightly distended. Hysterectomy. No pelvic mass. Minimal amount of free pelvic fluid.  Appendix normal. No bowel distention. No free air. No mesenteric mass. Tiny inguinal hernias. Tiny umbilical hernia. No bowel herniation.  No acute bony abnormality identified. Degenerative changes lumbar spine. Diffuse mild anasarca.  Review of the MIP images confirms the above findings.  IMPRESSION: 1. Thoracic and abdominal aorta unremarkable. No evidence of aneurysm or dissection. No pulmonary embolus. 2. Coronary artery disease.  Cardiomegaly. 3. Sliding hiatal hernia with esophageal dilatation and fluid within the esophagus. Gastroesophageal reflux could present in this fashion. Nonemergent barium swallow can be obtained to further evaluate. 4. Mild bladder distention. 5. Mild amount free pelvic fluid.  Mild anasarca.   Electronically Signed   By: Maisie Fus  Register   On: 01/07/2014 13:18   Review of Systems: Gen: Reports fatigue, poor appetite since Wednesday d/t nausea. Denies any fever, chills, sweats, weight loss, and sleep disorder HEENT: No visual complaints, No history of Retinopathy. Normal external appearance No Epistaxis or Sore throat. No sinusitis.   CV: Denies chest pain, angina, palpitations,  syncope, orthopnea, PND, peripheral edema, and claudication. Resp: Denies dyspnea at rest, dyspnea with exercise, cough, sputum, wheezing, coughing up blood, and pleurisy. GI: Reports generalized abdominal pain, nausea and vomiting. Per daughter last BM Saturday. GU : Reports urinary hesitancy. Denies urinary burning,  MS: Denies joint pain, limitation of movement, and swelling, stiffness, low back pain, extremity pain. Denies muscle weakness, cramps, atrophy.   Derm: Denies rash, itching, dry skin, hives, moles, warts, or unhealing ulcers.  Psych: Denies depression, anxiety, memory loss, suicidal ideation, hallucinations, paranoia, and confusion. Heme: Denies bruising, bleeding, and enlarged lymph nodes. Neuro: No headache.  No diplopia. No dysarthria.  No dysphasia.  No history of CVA.  No Seizures. No paresthesias.  No weakness. Endocrine  No Thyroid disease.  No Adrenal disease. Glucose high at home- unable to give me any recent home readings  Physical Exam: Filed Vitals:   01/08/14 0400 01/08/14 0500 01/08/14 0600 01/08/14 0800  BP: 207/75 207/75 221/85 198/77  Pulse: 81 87 87 85  Temp:  98.2 F (36.8 C)    TempSrc:  Oral    Resp: Height:      Weight:  58.4 kg (128 lb 12 oz)    SpO2: 99% 100% 100% 100%  General: Well developed, well nourished, moaning and appears uncomfortable but in no acute distress.Later developed more pain, guarding on exam Head: Normocephalic, atraumatic, sclera non-icteric, mucus membranes are moist . Film on tongue Neck: Supple. JVD not elevated. No carotid bruits PCL Lungs: Clear bilaterally to auscultation without wheezes, rales, or rhonchi. Breathing is unlabored. Heart: RRR with S1 S2. No murmurs, rubs, or gallops appreciated. Gr 2/6 SEM Abdomen: Soft, non-tender (pain currently improved), non-distended with normoactive bowel sounds. No rebound/guarding. No obvious abdominal masses.  Guarding diffusely now M-S:  Strength and tone appear  normal for age. Lower extremities:without edema or ischemic changes, no open wounds  Neuro: Alert and oriented X 3. Moves all extremities spontaneously. Psych:  Responds to questions appropriately with a normal affect. Dialysis Access: LUA AVF +b/t  Dialysis Orders: MWF @ [facility]  2 hrs  2K/2Ca 1800 Heparin R AVF  200/1.5     hectorol 2 mcg IV/HD Aranesp60 q week  Venofer 100 x10 (stop 9/21)  Assessment/Plan:  1. Abd pain/N/V- workup per primary.  CT- Thoracic and abdominal aorta are unremarkable, no evidence of aneurysm or dissection, no pulmonary embolism, mild bladder distention, mild anasarca. WBC 11.6.  Afebrile. Chest xray- no acute abnormality. Some relief with phenergan, likely viral gastroenteritis. ? Some other cause, ie ischemia, or other inflam etio  2. ESRD -  MWF @ [place], only 2 HD treatments. HD pending today. Continue to increase time and lower EDW. K+3.9 1.  Hypertension/volume  - 198/77- continue home meds of norvasc, coreg, clonidine, hydralazine and isosorbide.  Cont volume removal at HD. Will need to stop lasix now that she has started HD.^ Time and vol off 2.  Anemia  - hgb 12.3, hold ESA for now, last dose 8/26, watch CBC. Continue Fe q HD 3.  Metabolic bone disease -  Ca+9.6. Cont hectorol.  Stop home Calcitrol, dicussed with daughter. phoslo- last phos 4.1 4.  Nutrition - NPO. Alb 3.2- renal vit- needs to be on out pt as well.  5. Hyperglycemia/DM- per primary. Managed outpt by Dr. Andrey Campanile. hgb A1c 8/26 was 12.8- unsure of home glucose readings.  6. Urinary retention- foley catheter in place,  I&O cath yesterday. UA- small leukocytes. Few bacteria.   Jetty Duhamel, NP [facility] [phone] 01/08/2014, 11:00 AM I have seen and examined this patient and agree with the plan of care seen, eval,examined.   .  Epimenio Schetter L 01/08/2014, 1:30 PM

## 2014-01-08 NOTE — Progress Notes (Signed)
[facility]                                                                              Patient Demographics  Madeline [name], is a 58 y.o. female, DOB - [birth year], ZOX:[id number]  Admit date - 01/07/2014   Admitting Physician Edsel Petrin, DO  Outpatient Primary MD for the patient is Pamelia Hoit, MD  LOS - 1   Chief Complaint  Patient presents with  . Abdominal Pain      HPI on 01/07/2014 Madeline [name] is a 58 y.o. female with a history of end-stage renal disease, diabetes mellitus, hypertension, diastolic heart failure that presented to the emergency department for abdominal pain, nausea, vomiting. Patient's vomiting began on Wednesday this past week after dialyzing. Patient recently started dialyzing on Wednesday. Patient has been unable to keep anything down since Wednesday, and has not been taking her medications. Patient denied any recent ill contacts or travel. She denied any cough or fever or chills. Patient does complain of feeling tired and weak. She also complained of feeling the urge to urinate however unable to do so. Upon arrival to emergency department, patient was found to have a glucose of 425, CO2 20, and was started on glucose stabilizer. Her daughter was at bedside and stated that patient recently underwent tooth extraction on Tuesday. However was not taking any antibiotics post tooth extraction.   Assessment & Plan   Diabetic Ketoacidosis/increased anion gap metabolic acidosis  -Patient continues to have elevated gap, however bicarbonate 24 -Continue glucose stabilizer -Refraining from IVF at this time due history of diastolic heart failure as well as end-stage renal disease.  -Continue to monitor her BMP every 2 hours as well as her CBGs hourly.  -Continue potassium replacement as needed.  -Patient likely went to DKA secondary to viral gastroenteritis versus newly starting hemodialysis. Patient has been vomiting since Wednesday, and not taking her  medications.  -Hemoglobin A1c  pending -Chest x-ray: No acute abnormality  -Patient is afebrile no leukocytosis  -UA not convincing for infection -Continue anti-emetics as needed  Abdominal pain with nausea and vomiting  -CT of the abdomen and pelvis: Thoracic and abdominal aorta are unremarkable, no evidence of aneurysm or dissection, no pulmonary embolism, mild bladder distention, mild anasarca  -Place patient on antiemetics as well as pain medications as needed.  -Likely secondary to viral gastroenteritis, patient currently has no white count and is afebrile  -Denies any diarrhea at this time   Urinary Retention -Bladder scan yesterday evening showed approximately 700 cc, in and out cath was conducted in the 50 cc were removed. -Will continue to monitor.  End-stage renal disease on hemodialysis  -Nephrology consulted and appreciated. -Patient dialyzes on Monday, Wednesday, Friday.   Accelerated hypertension  -Likely secondary to not taking her home medications  -Continue amlodipine, Coreg, clonidine, hydralazine  -Will hold Lasix and metolazone  -Continue IV hydralazine as needed for systolic blood pressures above 180   Hyperlipidemia  -Continue statin   Anemia secondary to chronic disease  -Continue ferrous sulfate  -Continue to monitor CBC   Chronic diastolic heart failure  -Echocardiogram in October 2014 shows an EF of 65%, diastolic dysfunction.  -Continue to monitor  patient's daily weights, intake and output.  -Will refrain from providing IV fluids at this time.   GERD  -Continue Protonix   Code Status: full  Family Communication: Granddaughter at bedside  Disposition Plan: Admitted  Time Spent in minutes   30 minutes  Procedures  None  Consults   Nephrology  DVT Prophylaxis  Heparin  Lab Results  Component Value Date   PLT 202 01/08/2014    Medications  Scheduled Meds: . amLODipine  10 mg Oral Daily  . aspirin EC  81 mg Oral Daily  .  atorvastatin  40 mg Oral q1800  . calcitRIOL  0.25 mcg Oral Daily  . calcium acetate (Phos Binder)  667 mg Oral TID WC  . carvedilol  25 mg Oral BID WC  . cloNIDine  0.1 mg Oral Daily  . cloNIDine  0.2 mg Oral QHS  . ferrous sulfate  325 mg Oral Q breakfast  . heparin  5,000 Units Subcutaneous 3 times per day  . hydrALAZINE  100 mg Oral 3 times per day  . insulin regular  0-10 Units Intravenous TID WC  . isosorbide mononitrate  120 mg Oral Daily  . pantoprazole  40 mg Oral BID   Continuous Infusions: . insulin (NOVOLIN-R) infusion 3.2 Units/hr (01/08/14 0915)   PRN Meds:.acetaminophen, acetaminophen, albuterol, dextrose, hydrALAZINE, morphine, nitroGLYCERIN, ondansetron (ZOFRAN) IV, ondansetron, [START ON 01/09/2014] pneumococcal 23 valent vaccine, promethazine  Antibiotics    Anti-infectives   None      Subjective:   Madeline [name] seen and examined today.  Patient states her abdominal pain and nausea have improved. She denies any nausea or vomiting this morning. She states her abdominal pain as improved. Patient denies chest pain, shortness of breath, headache, dizziness at this time.  Objective:   Filed Vitals:   01/08/14 0400 01/08/14 0500 01/08/14 0600 01/08/14 0800  BP: 207/75 207/75 221/85 198/77  Pulse: 81 87 87 85  Temp:  98.2 F (36.8 C)    TempSrc:  Oral    Resp: Height:      Weight:  58.4 kg (128 lb 12 oz)    SpO2: 99% 100% 100% 100%    Wt Readings from Last 3 Encounters:  01/08/14 58.4 kg (128 lb 12 oz)  12/21/13 63.504 kg (140 lb)  10/13/13 62.596 kg (138 lb)     Intake/Output Summary (Last 24 hours) at 01/08/14 1000 Last data filed at 01/08/14 0015  Gross per 24 hour  Intake 242.66 ml  Output   1200 ml  Net -957.34 ml    Exam General: Well developed, well nourished, moderate distress, appears stated age  HEENT: NCAT, mucous membranes moist.  Neck: Supple, no JVD, no masses  Cardiovascular: S1 S2 auscultated, no rubs, murmurs or  gallops. Regular rate and rhythm.  Respiratory: Clear to auscultation bilaterally with equal chest rise  Abdomen: Soft, diffuse mild tenderness, nondistended, + bowel sounds  Extremities: warm dry without cyanosis clubbing or edema  Neuro: AAOx3, no focal deficits Skin: Without rashes exudates or nodules  Psych: Appropriate affect and demeanor  Data Review   Micro Results Recent Results (from the past 240 hour(s))  MRSA PCR SCREENING     Status: None   Collection Time    01/07/14  6:15 PM      Result Value Ref Range Status   MRSA by PCR NEGATIVE  NEGATIVE Final   Comment:            The GeneXpert MRSA  Assay (FDA     approved for NASAL specimens     only), is one component of a     comprehensive MRSA colonization     surveillance program. It is not     intended to diagnose MRSA     infection nor to guide or     monitor treatment for     MRSA infections.    Radiology Reports Dg Chest 2 View  01/07/2014   CLINICAL DATA:  Right-sided chest pain  EXAM: CHEST  2 VIEW  COMPARISON:  12/21/1948  FINDINGS: Cardiac shadow remains mildly enlarged. The lungs are clear bilaterally. No focal infiltrate or sizable effusion is noted. No acute bony abnormality is noted.  IMPRESSION: No acute abnormality seen.   Electronically Signed   By: Alcide Clever M.D.   On: 01/07/2014 13:38   Dg Chest 2 View  12/21/2013   CLINICAL DATA:  Difficulty breathing  EXAM: CHEST  2 VIEW  COMPARISON:  Chest radiograph June 03, 2013; chest CT June 03, 2013  FINDINGS: There is no edema or consolidation. Heart is enlarged with pulmonary vascularity within normal limits. No adenopathy. There is atherosclerotic change in aorta. No bone lesions.  IMPRESSION: Cardiomegaly.  No edema or consolidation.   Electronically Signed   By: Bretta Bang M.D.   On: 12/21/2013 13:41   Ct Head Wo Contrast  12/21/2013   CLINICAL DATA:  Weakness and nausea for 2 days.  EXAM: CT HEAD WITHOUT CONTRAST  TECHNIQUE: Contiguous axial  images were obtained from the base of the skull through the vertex without intravenous contrast.  COMPARISON:  03/15/2013.  FINDINGS: No mass lesion, mass effect, midline shift, hydrocephalus, hemorrhage. No territorial ischemia or acute infarction. Intracranial atherosclerosis is present. Calvarium intact. Visualized paranasal sinuses appear clear.  IMPRESSION: Negative CT head.   Electronically Signed   By: Andreas Newport M.D.   On: 12/21/2013 13:52   Ct Angio Chest Aorta W/cm &/or Wo/cm  01/07/2014   CLINICAL DATA:  Pain.  Evaluate for dissection.  EXAM: CT ANGIOGRAPHY CHEST, ABDOMEN AND PELVIS  TECHNIQUE: Multidetector CT imaging through the chest, abdomen and pelvis was performed using the standard protocol during bolus administration of intravenous contrast. Multiplanar reconstructed images and MIPs were obtained and reviewed to evaluate the vascular anatomy.  CONTRAST:  OMNIPAQUE IOHEXOL 350 MG/ML SOLN  COMPARISON:  CT 06/03/2013 and 02/13/2013 .  FINDINGS: CTA CHEST FINDINGS  Thoracic aortic atherosclerotic vascular disease. No aneurysm or dissection. Pulmonary arteries are normal. Coronary artery disease. Cardiomegaly. No significant adenopathy. The thoracic esophagus is dilated and contains fluid. Sliding hiatal hernia is present. Gastroesophageal reflux may be present. Further evaluation with nonemergent barium swallow can be obtained.  Large airways patent. Mild basilar atelectasis. No focal infiltrate. No pleural effusion or pneumothorax.  No acute thoracic bony abnormality identified. Visualized thyroid normal. No axillary adenopathy.  Review of the MIP images confirms the above findings.  CTA ABDOMEN AND PELVIS FINDINGS  Abdominal aorta is normal in caliber. No evidence of abdominal aortic aneurysm or dissection. Aortoiliac atherosclerotic vascular disease present. Visceral vessels are patent.  Liver normal. Spleen normal. Pancreas normal. Gallbladder is nondistended. Common bile duct upper  limits normal at 7 mm.  Adrenals normal. No focal renal abnormality. No hydronephrosis or obstructing ureteral stone. Bladder slightly distended. Hysterectomy. No pelvic mass. Minimal amount of free pelvic fluid.  Appendix normal. No bowel distention. No free air. No mesenteric mass. Tiny inguinal hernias. Tiny umbilical hernia. No bowel herniation.  No  acute bony abnormality identified. Degenerative changes lumbar spine. Diffuse mild anasarca.  Review of the MIP images confirms the above findings.  IMPRESSION: 1. Thoracic and abdominal aorta unremarkable. No evidence of aneurysm or dissection. No pulmonary embolus. 2. Coronary artery disease.  Cardiomegaly. 3. Sliding hiatal hernia with esophageal dilatation and fluid within the esophagus. Gastroesophageal reflux could present in this fashion. Nonemergent barium swallow can be obtained to further evaluate. 4. Mild bladder distention. 5. Mild amount free pelvic fluid.  Mild anasarca.   Electronically Signed   By: Maisie Fus  Register   On: 01/07/2014 13:18   Ct Cta Abd/pel W/cm &/or W/o Cm  01/07/2014   CLINICAL DATA:  Pain.  Evaluate for dissection.  EXAM: CT ANGIOGRAPHY CHEST, ABDOMEN AND PELVIS  TECHNIQUE: Multidetector CT imaging through the chest, abdomen and pelvis was performed using the standard protocol during bolus administration of intravenous contrast. Multiplanar reconstructed images and MIPs were obtained and reviewed to evaluate the vascular anatomy.  CONTRAST:  OMNIPAQUE IOHEXOL 350 MG/ML SOLN  COMPARISON:  CT 06/03/2013 and 02/13/2013 .  FINDINGS: CTA CHEST FINDINGS  Thoracic aortic atherosclerotic vascular disease. No aneurysm or dissection. Pulmonary arteries are normal. Coronary artery disease. Cardiomegaly. No significant adenopathy. The thoracic esophagus is dilated and contains fluid. Sliding hiatal hernia is present. Gastroesophageal reflux may be present. Further evaluation with nonemergent barium swallow can be obtained.  Large airways  patent. Mild basilar atelectasis. No focal infiltrate. No pleural effusion or pneumothorax.  No acute thoracic bony abnormality identified. Visualized thyroid normal. No axillary adenopathy.  Review of the MIP images confirms the above findings.  CTA ABDOMEN AND PELVIS FINDINGS  Abdominal aorta is normal in caliber. No evidence of abdominal aortic aneurysm or dissection. Aortoiliac atherosclerotic vascular disease present. Visceral vessels are patent.  Liver normal. Spleen normal. Pancreas normal. Gallbladder is nondistended. Common bile duct upper limits normal at 7 mm.  Adrenals normal. No focal renal abnormality. No hydronephrosis or obstructing ureteral stone. Bladder slightly distended. Hysterectomy. No pelvic mass. Minimal amount of free pelvic fluid.  Appendix normal. No bowel distention. No free air. No mesenteric mass. Tiny inguinal hernias. Tiny umbilical hernia. No bowel herniation.  No acute bony abnormality identified. Degenerative changes lumbar spine. Diffuse mild anasarca.  Review of the MIP images confirms the above findings.  IMPRESSION: 1. Thoracic and abdominal aorta unremarkable. No evidence of aneurysm or dissection. No pulmonary embolus. 2. Coronary artery disease.  Cardiomegaly. 3. Sliding hiatal hernia with esophageal dilatation and fluid within the esophagus. Gastroesophageal reflux could present in this fashion. Nonemergent barium swallow can be obtained to further evaluate. 4. Mild bladder distention. 5. Mild amount free pelvic fluid.  Mild anasarca.   Electronically Signed   By: Maisie Fus  Register   On: 01/07/2014 13:18    CBC  Recent Labs Lab 01/07/14 1000 01/08/14 0345  WBC 9.4 11.6*  HGB 12.4 12.3  HCT 37.6 36.8  PLT 210 202  MCV 88.5 86.2  MCH 29.2 28.8  MCHC 33.0 33.4  RDW 13.8 13.8  LYMPHSABS 1.9  --   MONOABS 0.6  --   EOSABS 0.1  --   BASOSABS 0.0  --     Chemistries   Recent Labs Lab 01/07/14 1000  01/07/14 2324 01/08/14 0117 01/08/14 0345  01/08/14 0537 01/08/14 0900  NA 136*  < > 139 140 141 140 142  K 4.4  < > 3.5* 3.9 4.0 3.9 3.9  CL 91*  < > 99 100 101 99 102  CO2 20  < >  GLUCOSE 425*  < > 199* 140* 132* 191* 181*  BUN 28*  < > 31* 32* 32* 33* 34*  CREATININE 2.69*  < > 2.79* 2.90* 2.95* 3.16* 3.39*  CALCIUM 9.8  < > 9.8 9.7 9.8 9.5 9.6  AST 39*  --   --   --   --   --   --   ALT 22  --   --   --   --   --   --   ALKPHOS 144*  --   --   --   --   --   --   BILITOT 0.4  --   --   --   --   --   --   < > = values in this interval not displayed. ------------------------------------------------------------------------------------------------------------------ estimated creatinine clearance is 13.6 ml/min (by C-G formula based on Cr of 3.39). ------------------------------------------------------------------------------------------------------------------ No results found for this basename: HGBA1C,  in the last 72 hours ------------------------------------------------------------------------------------------------------------------ No results found for this basename: CHOL, HDL, LDLCALC, TRIG, CHOLHDL, LDLDIRECT,  in the last 72 hours ------------------------------------------------------------------------------------------------------------------ No results found for this basename: TSH, T4TOTAL, FREET3, T3FREE, THYROIDAB,  in the last 72 hours ------------------------------------------------------------------------------------------------------------------ No results found for this basename: VITAMINB12, FOLATE, FERRITIN, TIBC, IRON, RETICCTPCT,  in the last 72 hours  Coagulation profile No results found for this basename: INR, PROTIME,  in the last 168 hours  No results found for this basename: DDIMER,  in the last 72 hours  Cardiac Enzymes  Recent Labs Lab 01/07/14 0922  TROPONINI <0.30    ------------------------------------------------------------------------------------------------------------------ No components found with this basename: POCBNP,     Eviana Sibilia D.O. on 01/08/2014 at 10:00 AM  Between 7am to 7pm - Pager - [phone]  After 7pm go to [email or web address] - password [email or web address]  And look for the night coverage person covering for me after hours  [facility] Office  [phone]

## 2014-01-09 LAB — BASIC METABOLIC PANEL
ANION GAP: 13 (ref 5–15)
BUN: 33 mg/dL — ABNORMAL HIGH (ref 6–23)
CALCIUM: 8.6 mg/dL (ref 8.4–10.5)
CO2: 27 meq/L (ref 19–32)
CREATININE: 4.22 mg/dL — AB (ref 0.50–1.10)
Chloride: 101 mEq/L (ref 96–112)
GFR calc Af Amer: 13 mL/min — ABNORMAL LOW (ref >90)
GFR, EST NON AFRICAN AMERICAN: 11 mL/min — AB (ref >90)
Glucose, Bld: 167 mg/dL — ABNORMAL HIGH (ref 70–99)
Potassium: 4.1 mEq/L (ref 3.7–5.3)
Sodium: 141 mEq/L (ref 137–147)

## 2014-01-09 LAB — URINE CULTURE: Colony Count: >=100000

## 2014-01-09 LAB — GLUCOSE, CAPILLARY
Glucose-Capillary: 296 mg/dL — ABNORMAL HIGH (ref 70–99)
Glucose-Capillary: 215 mg/dL — ABNORMAL HIGH (ref 70–99)
Glucose-Capillary: 153 mg/dL — ABNORMAL HIGH (ref 70–99)
Glucose-Capillary: 164 mg/dL — ABNORMAL HIGH (ref 70–99)
Glucose-Capillary: 136 mg/dL — ABNORMAL HIGH (ref 70–99)

## 2014-01-09 LAB — CBC
HCT: 33.2 % — ABNORMAL LOW (ref 36.0–46.0)
Hemoglobin: 10.5 g/dL — ABNORMAL LOW (ref 12.0–15.0)
MCH: 28.2 pg (ref 26.0–34.0)
MCHC: 31.6 g/dL (ref 30.0–36.0)
MCV: 89 fL (ref 78.0–100.0)
PLATELETS: 178 10*3/uL (ref 150–400)
RBC: 3.73 MIL/uL — AB (ref 3.87–5.11)
RDW: 14.6 % (ref 11.5–15.5)
WBC: 9 10*3/uL (ref 4.0–10.5)

## 2014-01-09 LAB — CK: Total CK: 65 U/L (ref 7–177)

## 2014-01-09 LAB — TROPONIN I: Troponin I: <0.3 ng/mL (ref <0.30)

## 2014-01-09 LAB — LACTIC ACID, PLASMA: Lactic Acid, Venous: 1.2 mmol/L (ref 0.5–2.2)

## 2014-01-09 MED ORDER — GI COCKTAIL ~~LOC~~
30.0000 mL | Freq: Once | ORAL | Status: AC
  Administered 2014-01-09: 30 mL via ORAL
  Filled 2014-01-09: qty 30

## 2014-01-09 MED ORDER — SODIUM CHLORIDE 0.9 % IV SOLN: 100.0000 mL | INTRAVENOUS | Status: DC | PRN

## 2014-01-09 MED ORDER — LIDOCAINE-PRILOCAINE 2.5-2.5 % EX CREA: 1.0000 "application " | TOPICAL_CREAM | CUTANEOUS | Status: DC | PRN

## 2014-01-09 MED ORDER — PANTOPRAZOLE SODIUM 40 MG IV SOLR
40.0000 mg | Freq: Two times a day (BID) | INTRAVENOUS | Status: DC
  Administered 2014-01-09 – 2014-01-11 (×4): 40 mg via INTRAVENOUS
  Filled 2014-01-09 (×7): qty 40

## 2014-01-09 MED ORDER — HEPARIN SODIUM (PORCINE) 1000 UNIT/ML DIALYSIS: 1000.0000 [IU] | INTRAMUSCULAR | Status: DC | PRN

## 2014-01-09 MED ORDER — LIDOCAINE HCL (PF) 1 % IJ SOLN: 5.0000 mL | INTRAMUSCULAR | Status: DC | PRN

## 2014-01-09 MED ORDER — NEPRO/CARBSTEADY PO LIQD: 237.0000 mL | ORAL | Status: DC | PRN

## 2014-01-09 MED ORDER — PENTAFLUOROPROP-TETRAFLUOROETH EX AERO: 1.0000 "application " | INHALATION_SPRAY | CUTANEOUS | Status: DC | PRN

## 2014-01-09 MED ORDER — HEPARIN SODIUM (PORCINE) 1000 UNIT/ML DIALYSIS: 100.0000 [IU]/kg | INTRAMUSCULAR | Status: DC | PRN

## 2014-01-09 MED ORDER — AMLODIPINE BESYLATE 10 MG PO TABS
10.0000 mg | ORAL_TABLET | Freq: Every day | ORAL | Status: DC
  Administered 2014-01-09 – 2014-01-10 (×2): 10 mg via ORAL
  Filled 2014-01-09 (×3): qty 1

## 2014-01-09 MED ORDER — PROMETHAZINE HCL 25 MG/ML IJ SOLN
12.5000 mg | INTRAMUSCULAR | Status: DC | PRN
  Administered 2014-01-09: 12.5 mg via INTRAVENOUS
  Filled 2014-01-09: qty 1

## 2014-01-09 MED ORDER — ALTEPLASE 2 MG IJ SOLR
2.0000 mg | Freq: Once | INTRAMUSCULAR | Status: AC | PRN
  Filled 2014-01-09: qty 2

## 2014-01-09 NOTE — Progress Notes (Signed)
Pt visiting with family at thie time and not reay to be place on CPAP. RT will place on when pt is ready.

## 2014-01-09 NOTE — Progress Notes (Signed)
Transfer note:  Arrival Method: Bed Mental Orientation:A&OX4 Telemetry:  Assessment: see docflowsheet Skin: Dry;Intact IV: R wrist/ R ACE Pain: 10/10- Abdominal pain Tubes:Foley cath   6700 Orientation: Patient has been oriented to the unit, staff and to the room.

## 2014-01-09 NOTE — Progress Notes (Signed)
[facility]                                                                              Patient Demographics  Madeline [name], is a 58 y.o. female, DOB - 1955-09-04, ZOX:[id number]  Admit date - 01/07/2014   Admitting Physician Edsel Petrin, DO  Outpatient Primary MD for the patient is Pamelia Hoit, MD  LOS - 2   Chief Complaint  Patient presents with  . Abdominal Pain      HPI on 01/07/2014 Madeline [name] is a 58 y.o. female with a history of end-stage renal disease, diabetes mellitus, hypertension, diastolic heart failure that presented to the emergency department for abdominal pain, nausea, vomiting. Patient's vomiting began on Wednesday this past week after dialyzing. Patient recently started dialyzing on Wednesday. Patient has been unable to keep anything down since Wednesday, and has not been taking her medications. Patient denied any recent ill contacts or travel. She denied any cough or fever or chills. Patient does complain of feeling tired and weak. She also complained of feeling the urge to urinate however unable to do so. Upon arrival to emergency department, patient was found to have a glucose of 425, CO2 20, and was started on glucose stabilizer. Her daughter was at bedside and stated that patient recently underwent tooth extraction on Tuesday. However was not taking any antibiotics post tooth extraction.   Assessment & Plan   Questionable Diabetic Ketoacidosis/increased anion gap metabolic acidosis  -Patient continues to have elevated gap, however bicarbonate 24 -Continue glucose stabilizer -Refraining from IVF at this time due history of diastolic heart failure as well as end-stage renal disease.  -Gap closed -Patient likely went to? DKA secondary to viral gastroenteritis versus newly starting hemodialysis. Patient has been vomiting since Wednesday, and not taking her medications.  -Hemoglobin A1c  12.3 -Chest x-ray: No acute abnormality  -Patient is afebrile no  leukocytosis  -UA not convincing for infection -Continue anti-emetics as needed  Abdominal pain with nausea and vomiting  -Etiology unclear -CT of the abdomen and pelvis: Thoracic and abdominal aorta are unremarkable, no evidence of aneurysm or dissection, no pulmonary embolism, mild bladder distention, mild anasarca  -Place patient on antiemetics as well as pain medications as needed.  -Likely secondary to viral gastroenteritis, patient currently has no white count and is afebrile  -Denies any diarrhea at this time  -lactic acid 1.2 (unlikely ischemia as patient afebrile, CT scan of abdomen unimpressive, no leukocytosis, patient afebrile) -Patient's pain seems to dissipate as soon as pain medications are given. ? Malingering versus psychogenic -Will continue to monitor -Will continue Protonix as well as give her GI cocktail  Urinary Retention -Bladder scan yesterday evening showed approximately 700 cc, in and out cath was conducted in the 50 cc were removed. -Will continue to monitor.  End-stage renal disease on hemodialysis  -Nephrology consulted and appreciated. -Patient dialyzes on Monday, Wednesday, Friday.   Accelerated hypertension  -Likely secondary to not taking her home medications  -Continue amlodipine, Coreg, clonidine, hydralazine  -Will hold Lasix and metolazone  -Continue IV hydralazine as needed for systolic blood pressures above 180   Hyperlipidemia  -Continue statin   Anemia secondary to chronic disease  -  Continue ferrous sulfate  -Continue to monitor CBC   Chronic diastolic heart failure  -Echocardiogram in October 2014 shows an EF of 65%, diastolic dysfunction.  -Continue to monitor patient's daily weights, intake and output.  -Will refrain from providing IV fluids at this time.   GERD  -Continue Protonix   Code Status: full  Family Communication: Granddaughter at bedside  Disposition Plan: Admitted, will likely transfer to the floor later today if  patient remains stable  Time Spent in minutes   30 minutes  Procedures  None  Consults   Nephrology  DVT Prophylaxis  Heparin  Lab Results  Component Value Date   PLT 178 01/09/2014    Medications  Scheduled Meds: . amLODipine  10 mg Oral QHS  . aspirin EC  81 mg Oral Daily  . atorvastatin  40 mg Oral q1800  . calcium acetate (Phos Binder)  667 mg Oral TID WC  . carvedilol  25 mg Oral BID WC  . clotrimazole  10 mg Oral 5 X Daily  . doxercalciferol  2 mcg Intravenous Q M,W,F-HD  . ferric gluconate (FERRLECIT/NULECIT) IV  125 mg Intravenous Q M,W,F-HD  . heparin  5,000 Units Subcutaneous 3 times per day  . hydrALAZINE  100 mg Oral 3 times per day  . insulin aspart  0-5 Units Subcutaneous QHS  . insulin aspart  0-9 Units Subcutaneous TID WC  . insulin glargine  10 Units Subcutaneous QHS  . isosorbide mononitrate  120 mg Oral Daily  . multivitamin  1 tablet Oral QHS  . pantoprazole (PROTONIX) IV  40 mg Intravenous Q12H   Continuous Infusions: . insulin (NOVOLIN-R) infusion Stopped (01/08/14 2212)   PRN Meds:.acetaminophen, acetaminophen, albuterol, dextrose, hydrALAZINE, morphine injection, nitroGLYCERIN, ondansetron (ZOFRAN) IV, ondansetron, pneumococcal 23 valent vaccine, promethazine  Antibiotics    Anti-infectives   None      Subjective:   Madeline [name] seen and examined today.  Patient states her abdominal pain is worsened. She is wintering in pain. Patient denies any nausea or vomiting. Currently denies any chest pain or shortness of breath.  Objective:   Filed Vitals:   01/09/14 0000 01/09/14 0400 01/09/14 0600 01/09/14 0734  BP: 132/58 127/56 194/69 218/76  Pulse: 57 60 72 69  Temp: 98.5 F (36.9 C) 98.6 F (37 C)  98 F (36.7 C)  TempSrc: Axillary Axillary  Axillary  Resp: 13 10 12 24   Height:      Weight:      SpO2: 100% 98% 98% 100%    Wt Readings from Last 3 Encounters:  01/08/14 58 kg (127 lb 13.9 oz)  12/21/13 63.504 kg (140 lb)  10/13/13  62.596 kg (138 lb)     Intake/Output Summary (Last 24 hours) at 01/09/14 1032 Last data filed at 01/09/14 0600  Gross per 24 hour  Intake      0 ml  Output    484 ml  Net   -484 ml    Exam General: Well developed, well nourished, moderate distress, appears stated age  HEENT: NCAT, mucous membranes moist.  Cardiovascular: S1 S2 auscultated, no rubs, murmurs or gallops. Regular rate and rhythm.  Respiratory: Clear to auscultation bilaterally with equal chest rise  Abdomen: Soft, diffuse mild tenderness, nondistended, + bowel sounds  Extremities: warm dry without cyanosis clubbing or edema,  LUE AVF with +bruit/thrill Neuro: AAOx3, no focal deficits Skin: Without rashes exudates or nodules  Psych: Appropriate affect and demeanor  Data Review   Micro Results Recent Results (from  the past 240 hour(s))  URINE CULTURE     Status: None   Collection Time    01/07/14  4:16 PM      Result Value Ref Range Status   Specimen Description URINE, RANDOM   Final   Special Requests ADDED [id number] [phone]   Final   Culture  Setup Time     Final   Value: 01/08/2014 12:30     Performed at [facility]   Colony Count     Final   Value: >=100,000 COLONIES/ML     Performed at [facility]   Culture     Final   Value: Multiple bacterial morphotypes present, none predominant. Suggest appropriate recollection if clinically indicated.     Performed at [facility]   Report Status 01/09/2014 FINAL   Final  MRSA PCR SCREENING     Status: None   Collection Time    01/07/14  6:15 PM      Result Value Ref Range Status   MRSA by PCR NEGATIVE  NEGATIVE Final   Comment:            The GeneXpert MRSA Assay (FDA     approved for NASAL specimens     only), is one component of a     comprehensive MRSA colonization     surveillance program. It is not     intended to diagnose MRSA     infection nor to guide or     monitor treatment for     MRSA infections.    Radiology Reports Dg  Chest 2 View  01/07/2014   CLINICAL DATA:  Right-sided chest pain  EXAM: CHEST  2 VIEW  COMPARISON:  12/21/1948  FINDINGS: Cardiac shadow remains mildly enlarged. The lungs are clear bilaterally. No focal infiltrate or sizable effusion is noted. No acute bony abnormality is noted.  IMPRESSION: No acute abnormality seen.   Electronically Signed   By: Alcide Clever M.D.   On: 01/07/2014 13:38   Dg Chest 2 View  12/21/2013   CLINICAL DATA:  Difficulty breathing  EXAM: CHEST  2 VIEW  COMPARISON:  Chest radiograph June 03, 2013; chest CT June 03, 2013  FINDINGS: There is no edema or consolidation. Heart is enlarged with pulmonary vascularity within normal limits. No adenopathy. There is atherosclerotic change in aorta. No bone lesions.  IMPRESSION: Cardiomegaly.  No edema or consolidation.   Electronically Signed   By: Bretta Bang M.D.   On: 12/21/2013 13:41   Ct Head Wo Contrast  12/21/2013   CLINICAL DATA:  Weakness and nausea for 2 days.  EXAM: CT HEAD WITHOUT CONTRAST  TECHNIQUE: Contiguous axial images were obtained from the base of the skull through the vertex without intravenous contrast.  COMPARISON:  03/15/2013.  FINDINGS: No mass lesion, mass effect, midline shift, hydrocephalus, hemorrhage. No territorial ischemia or acute infarction. Intracranial atherosclerosis is present. Calvarium intact. Visualized paranasal sinuses appear clear.  IMPRESSION: Negative CT head.   Electronically Signed   By: Andreas Newport M.D.   On: 12/21/2013 13:52   Ct Angio Chest Aorta W/cm &/or Wo/cm  01/07/2014   CLINICAL DATA:  Pain.  Evaluate for dissection.  EXAM: CT ANGIOGRAPHY CHEST, ABDOMEN AND PELVIS  TECHNIQUE: Multidetector CT imaging through the chest, abdomen and pelvis was performed using the standard protocol during bolus administration of intravenous contrast. Multiplanar reconstructed images and MIPs were obtained and reviewed to evaluate the vascular anatomy.  CONTRAST:  OMNIPAQUE IOHEXOL  350  MG/ML SOLN  COMPARISON:  CT 06/03/2013 and 02/13/2013 .  FINDINGS: CTA CHEST FINDINGS  Thoracic aortic atherosclerotic vascular disease. No aneurysm or dissection. Pulmonary arteries are normal. Coronary artery disease. Cardiomegaly. No significant adenopathy. The thoracic esophagus is dilated and contains fluid. Sliding hiatal hernia is present. Gastroesophageal reflux may be present. Further evaluation with nonemergent barium swallow can be obtained.  Large airways patent. Mild basilar atelectasis. No focal infiltrate. No pleural effusion or pneumothorax.  No acute thoracic bony abnormality identified. Visualized thyroid normal. No axillary adenopathy.  Review of the MIP images confirms the above findings.  CTA ABDOMEN AND PELVIS FINDINGS  Abdominal aorta is normal in caliber. No evidence of abdominal aortic aneurysm or dissection. Aortoiliac atherosclerotic vascular disease present. Visceral vessels are patent.  Liver normal. Spleen normal. Pancreas normal. Gallbladder is nondistended. Common bile duct upper limits normal at 7 mm.  Adrenals normal. No focal renal abnormality. No hydronephrosis or obstructing ureteral stone. Bladder slightly distended. Hysterectomy. No pelvic mass. Minimal amount of free pelvic fluid.  Appendix normal. No bowel distention. No free air. No mesenteric mass. Tiny inguinal hernias. Tiny umbilical hernia. No bowel herniation.  No acute bony abnormality identified. Degenerative changes lumbar spine. Diffuse mild anasarca.  Review of the MIP images confirms the above findings.  IMPRESSION: 1. Thoracic and abdominal aorta unremarkable. No evidence of aneurysm or dissection. No pulmonary embolus. 2. Coronary artery disease.  Cardiomegaly. 3. Sliding hiatal hernia with esophageal dilatation and fluid within the esophagus. Gastroesophageal reflux could present in this fashion. Nonemergent barium swallow can be obtained to further evaluate. 4. Mild bladder distention. 5. Mild amount free  pelvic fluid.  Mild anasarca.   Electronically Signed   By: Maisie Fus  Register   On: 01/07/2014 13:18   Ct Cta Abd/pel W/cm &/or W/o Cm  01/07/2014   CLINICAL DATA:  Pain.  Evaluate for dissection.  EXAM: CT ANGIOGRAPHY CHEST, ABDOMEN AND PELVIS  TECHNIQUE: Multidetector CT imaging through the chest, abdomen and pelvis was performed using the standard protocol during bolus administration of intravenous contrast. Multiplanar reconstructed images and MIPs were obtained and reviewed to evaluate the vascular anatomy.  CONTRAST:  OMNIPAQUE IOHEXOL 350 MG/ML SOLN  COMPARISON:  CT 06/03/2013 and 02/13/2013 .  FINDINGS: CTA CHEST FINDINGS  Thoracic aortic atherosclerotic vascular disease. No aneurysm or dissection. Pulmonary arteries are normal. Coronary artery disease. Cardiomegaly. No significant adenopathy. The thoracic esophagus is dilated and contains fluid. Sliding hiatal hernia is present. Gastroesophageal reflux may be present. Further evaluation with nonemergent barium swallow can be obtained.  Large airways patent. Mild basilar atelectasis. No focal infiltrate. No pleural effusion or pneumothorax.  No acute thoracic bony abnormality identified. Visualized thyroid normal. No axillary adenopathy.  Review of the MIP images confirms the above findings.  CTA ABDOMEN AND PELVIS FINDINGS  Abdominal aorta is normal in caliber. No evidence of abdominal aortic aneurysm or dissection. Aortoiliac atherosclerotic vascular disease present. Visceral vessels are patent.  Liver normal. Spleen normal. Pancreas normal. Gallbladder is nondistended. Common bile duct upper limits normal at 7 mm.  Adrenals normal. No focal renal abnormality. No hydronephrosis or obstructing ureteral stone. Bladder slightly distended. Hysterectomy. No pelvic mass. Minimal amount of free pelvic fluid.  Appendix normal. No bowel distention. No free air. No mesenteric mass. Tiny inguinal hernias. Tiny umbilical hernia. No bowel herniation.  No acute  bony abnormality identified. Degenerative changes lumbar spine. Diffuse mild anasarca.  Review of the MIP images confirms the above findings.  IMPRESSION: 1. Thoracic and abdominal  aorta unremarkable. No evidence of aneurysm or dissection. No pulmonary embolus. 2. Coronary artery disease.  Cardiomegaly. 3. Sliding hiatal hernia with esophageal dilatation and fluid within the esophagus. Gastroesophageal reflux could present in this fashion. Nonemergent barium swallow can be obtained to further evaluate. 4. Mild bladder distention. 5. Mild amount free pelvic fluid.  Mild anasarca.   Electronically Signed   By: Maisie Fus  Register   On: 01/07/2014 13:18    CBC  Recent Labs Lab 01/07/14 1000 01/08/14 0345 01/09/14 0228  WBC 9.4 11.6* 9.0  HGB 12.4 12.3 10.5*  HCT 37.6 36.8 33.2*  PLT 210 202 178  MCV 88.5 86.2 89.0  MCH 29.2 28.8 28.2  MCHC 33.0 33.4 31.6  RDW 13.8 13.8 14.6  LYMPHSABS 1.9  --   --   MONOABS 0.6  --   --   EOSABS 0.1  --   --   BASOSABS 0.0  --   --     Chemistries   Recent Labs Lab 01/07/14 1000  01/08/14 0537 01/08/14 0900 01/08/14 1150 01/08/14 1915 01/09/14 0228  NA 136*  < > 140 142 141 144 141  K 4.4  < > 3.9 3.9 3.7 4.8 4.1  CL 91*  < > 99 102 101 103 101  CO2 20  < > GLUCOSE 425*  < > 191* 181* 134* 93 167*  BUN 28*  < > 33* 34* 36* 30* 33*  CREATININE 2.69*  < > 3.16* 3.39* 3.62* 3.71* 4.22*  CALCIUM 9.8  < > 9.5 9.6 9.6 9.0 8.6  AST 39*  --   --   --   --   --   --   ALT 22  --   --   --   --   --   --   ALKPHOS 144*  --   --   --   --   --   --   BILITOT 0.4  --   --   --   --   --   --   < > = values in this interval not displayed. ------------------------------------------------------------------------------------------------------------------ estimated creatinine clearance is 10.9 ml/min (by C-G formula based on Cr of  4.22). ------------------------------------------------------------------------------------------------------------------  Recent Labs  01/07/14 1000  HGBA1C 12.3*   ------------------------------------------------------------------------------------------------------------------ No results found for this basename: CHOL, HDL, LDLCALC, TRIG, CHOLHDL, LDLDIRECT,  in the last 72 hours ------------------------------------------------------------------------------------------------------------------ No results found for this basename: TSH, T4TOTAL, FREET3, T3FREE, THYROIDAB,  in the last 72 hours ------------------------------------------------------------------------------------------------------------------ No results found for this basename: VITAMINB12, FOLATE, FERRITIN, TIBC, IRON, RETICCTPCT,  in the last 72 hours  Coagulation profile No results found for this basename: INR, PROTIME,  in the last 168 hours  No results found for this basename: DDIMER,  in the last 72 hours  Cardiac Enzymes  Recent Labs Lab 01/07/14 0922 01/09/14 0900  TROPONINI <0.30 <0.30   ------------------------------------------------------------------------------------------------------------------ No components found with this basename: POCBNP,     Morris Longenecker D.O. on 01/09/2014 at 10:32 AM  Between 7am to 7pm - Pager - [phone]  After 7pm go to [email or web address] - password [email or web address]  And look for the night coverage person covering for me after hours  [facility] Office  [phone]

## 2014-01-09 NOTE — Progress Notes (Signed)
Inpatient Diabetes Program Recommendations  AACE/ADA: New Consensus Statement on Inpatient Glycemic Control (2013)  Target Ranges:  Prepandial:   less than 140 mg/dL      Peak postprandial:   less than 180 mg/dL (1-2 hours)      Critically ill patients:  140 - 180 mg/dL   Reason for Assessment: Results for Madeline [name], Madeline [name] (MRN [id number]) as of 01/09/2014 12:47  Ref. Range 01/08/2014 22:20 01/09/2014 07:57 01/09/2014 11:09  Glucose-Capillary Latest Range: 70-99 mg/dL 147 (H) 829 (H) 562 (H)    Diabetes history: Type 2 diabetes/ with ESRD Outpatient Diabetes medications: Lantus 10 units q HS, Humalog correction for CBG's greater than 200 mg/dL Current orders for Inpatient glycemic control:  Lantus 10 units daily.  Novolog sensitive tid with meals and HS  May need slight increase in Lantus to 12 units q HS.  Will follow.  Thanks, Beryl Meager, RN, BC-ADM Inpatient Diabetes Coordinator Pager [phone]

## 2014-01-09 NOTE — Progress Notes (Signed)
Indwelling Foley cath placed per MD order for acute urinary retention. Pt had in and out cath done last night with greater than removed. Pt had no urinary output overnight with c/o lower ABD pain this morning. Pt unable to void again on her own. Peri care done and sterile technique maintained throughout procedure. Urine return was noted and pt tolerated procedure well. Will continue to monitor.

## 2014-01-09 NOTE — Progress Notes (Signed)
Pt transferred to 6E24 per MD order. Report called to receiving nurse and all questions answered. Foley continued per MD order, with orders to remove in the morning,

## 2014-01-09 NOTE — Progress Notes (Signed)
Subjective: Interval History: has complaints abdm pain.  Objective: Vital signs in last 24 hours: Temp:  [98.5 F (36.9 C)-99.4 F (37.4 C)] 98.6 F (37 C) (09/01 0400) Pulse Rate:  [57-85] 72 (09/01 0600) Resp:  [10-27] 12 (09/01 0600) BP: (106-225)/(55-99) 194/69 mmHg (09/01 0600) SpO2:  [97 %-100 %] 98 % (09/01 0600) Weight:  [58 kg (127 lb 13.9 oz)-59 kg (130 lb 1.1 oz)] 58 kg (127 lb 13.9 oz) (08/31 1417) Weight change: -0.421 kg (-14.9 oz)  Intake/Output from previous day: 08/31 0701 - 09/01 0700 In: -  Out: 484 [Urine:100] Intake/Output this shift:    General appearance: moderate distress and writhing in pain Resp: clear to auscultation bilaterally Cardio: regularly irregular rhythm and systolic murmur: holosystolic 2/6, blowing at apex GI: pos bs, soft, only tender on deep palpation Extremities: AVF LUA B&T  Lab Results:  Recent Labs  01/08/14 0345 01/09/14 0228  WBC 11.6* 9.0  HGB 12.3 10.5*  HCT 36.8 33.2*  PLT 202 178   BMET:  Recent Labs  01/08/14 1915 01/09/14 0228  NA 144 141  K 4.8 4.1  CL 103 101  CO2 28 27  GLUCOSE 93 167*  BUN 30* 33*  CREATININE 3.71* 4.22*  CALCIUM 9.0 8.6   No results found for this basename: PTH,  in the last 72 hours Iron Studies: No results found for this basename: IRON, TIBC, TRANSFERRIN, FERRITIN,  in the last 72 hours  Studies/Results: Dg Chest 2 View  01/07/2014   CLINICAL DATA:  Right-sided chest pain  EXAM: CHEST  2 VIEW  COMPARISON:  12/21/1948  FINDINGS: Cardiac shadow remains mildly enlarged. The lungs are clear bilaterally. No focal infiltrate or sizable effusion is noted. No acute bony abnormality is noted.  IMPRESSION: No acute abnormality seen.   Electronically Signed   By: Alcide Clever M.D.   On: 01/07/2014 13:38   Ct Angio Chest Aorta W/cm &/or Wo/cm  01/07/2014   CLINICAL DATA:  Pain.  Evaluate for dissection.  EXAM: CT ANGIOGRAPHY CHEST, ABDOMEN AND PELVIS  TECHNIQUE: Multidetector CT imaging  through the chest, abdomen and pelvis was performed using the standard protocol during bolus administration of intravenous contrast. Multiplanar reconstructed images and MIPs were obtained and reviewed to evaluate the vascular anatomy.  CONTRAST:  OMNIPAQUE IOHEXOL 350 MG/ML SOLN  COMPARISON:  CT 06/03/2013 and 02/13/2013 .  FINDINGS: CTA CHEST FINDINGS  Thoracic aortic atherosclerotic vascular disease. No aneurysm or dissection. Pulmonary arteries are normal. Coronary artery disease. Cardiomegaly. No significant adenopathy. The thoracic esophagus is dilated and contains fluid. Sliding hiatal hernia is present. Gastroesophageal reflux may be present. Further evaluation with nonemergent barium swallow can be obtained.  Large airways patent. Mild basilar atelectasis. No focal infiltrate. No pleural effusion or pneumothorax.  No acute thoracic bony abnormality identified. Visualized thyroid normal. No axillary adenopathy.  Review of the MIP images confirms the above findings.  CTA ABDOMEN AND PELVIS FINDINGS  Abdominal aorta is normal in caliber. No evidence of abdominal aortic aneurysm or dissection. Aortoiliac atherosclerotic vascular disease present. Visceral vessels are patent.  Liver normal. Spleen normal. Pancreas normal. Gallbladder is nondistended. Common bile duct upper limits normal at 7 mm.  Adrenals normal. No focal renal abnormality. No hydronephrosis or obstructing ureteral stone. Bladder slightly distended. Hysterectomy. No pelvic mass. Minimal amount of free pelvic fluid.  Appendix normal. No bowel distention. No free air. No mesenteric mass. Tiny inguinal hernias. Tiny umbilical hernia. No bowel herniation.  No acute bony abnormality identified. Degenerative  changes lumbar spine. Diffuse mild anasarca.  Review of the MIP images confirms the above findings.  IMPRESSION: 1. Thoracic and abdominal aorta unremarkable. No evidence of aneurysm or dissection. No pulmonary embolus. 2. Coronary artery  disease.  Cardiomegaly. 3. Sliding hiatal hernia with esophageal dilatation and fluid within the esophagus. Gastroesophageal reflux could present in this fashion. Nonemergent barium swallow can be obtained to further evaluate. 4. Mild bladder distention. 5. Mild amount free pelvic fluid.  Mild anasarca.   Electronically Signed   By: Maisie Fus  Register   On: 01/07/2014 13:18   Ct Cta Abd/pel W/cm &/or W/o Cm  01/07/2014   CLINICAL DATA:  Pain.  Evaluate for dissection.  EXAM: CT ANGIOGRAPHY CHEST, ABDOMEN AND PELVIS  TECHNIQUE: Multidetector CT imaging through the chest, abdomen and pelvis was performed using the standard protocol during bolus administration of intravenous contrast. Multiplanar reconstructed images and MIPs were obtained and reviewed to evaluate the vascular anatomy.  CONTRAST:  OMNIPAQUE IOHEXOL 350 MG/ML SOLN  COMPARISON:  CT 06/03/2013 and 02/13/2013 .  FINDINGS: CTA CHEST FINDINGS  Thoracic aortic atherosclerotic vascular disease. No aneurysm or dissection. Pulmonary arteries are normal. Coronary artery disease. Cardiomegaly. No significant adenopathy. The thoracic esophagus is dilated and contains fluid. Sliding hiatal hernia is present. Gastroesophageal reflux may be present. Further evaluation with nonemergent barium swallow can be obtained.  Large airways patent. Mild basilar atelectasis. No focal infiltrate. No pleural effusion or pneumothorax.  No acute thoracic bony abnormality identified. Visualized thyroid normal. No axillary adenopathy.  Review of the MIP images confirms the above findings.  CTA ABDOMEN AND PELVIS FINDINGS  Abdominal aorta is normal in caliber. No evidence of abdominal aortic aneurysm or dissection. Aortoiliac atherosclerotic vascular disease present. Visceral vessels are patent.  Liver normal. Spleen normal. Pancreas normal. Gallbladder is nondistended. Common bile duct upper limits normal at 7 mm.  Adrenals normal. No focal renal abnormality. No hydronephrosis  or obstructing ureteral stone. Bladder slightly distended. Hysterectomy. No pelvic mass. Minimal amount of free pelvic fluid.  Appendix normal. No bowel distention. No free air. No mesenteric mass. Tiny inguinal hernias. Tiny umbilical hernia. No bowel herniation.  No acute bony abnormality identified. Degenerative changes lumbar spine. Diffuse mild anasarca.  Review of the MIP images confirms the above findings.  IMPRESSION: 1. Thoracic and abdominal aorta unremarkable. No evidence of aneurysm or dissection. No pulmonary embolus. 2. Coronary artery disease.  Cardiomegaly. 3. Sliding hiatal hernia with esophageal dilatation and fluid within the esophagus. Gastroesophageal reflux could present in this fashion. Nonemergent barium swallow can be obtained to further evaluate. 4. Mild bladder distention. 5. Mild amount free pelvic fluid.  Mild anasarca.   Electronically Signed   By: Maisie Fus  Register   On: 01/07/2014 13:18    Prior to Admission:  Prescriptions prior to admission  Medication Sig Dispense Refill  . acetaminophen (TYLENOL) 500 MG tablet Take 500-1,000 mg by mouth every 6 (six) hours as needed for moderate pain.      Marland Kitchen albuterol (PROVENTIL HFA;VENTOLIN HFA) 108 (90 BASE) MCG/ACT inhaler Inhale 2 puffs into the lungs every 6 (six) hours as needed for wheezing or shortness of breath.       Marland Kitchen amLODipine (NORVASC) 10 MG tablet Take 10 mg by mouth daily.       Marland Kitchen aspirin EC 81 MG tablet Take 81 mg by mouth daily.      . calcitRIOL (ROCALTROL) 0.25 MCG capsule Take 0.25 mcg by mouth daily.      . calcium acetate (  PHOSLO) 667 MG capsule Take 667 mg by mouth 3 (three) times daily with meals.      . carvedilol (COREG) 25 MG tablet Take 25 mg by mouth 2 (two) times daily with a meal.      . cloNIDine (CATAPRES) 0.1 MG tablet Take 0.1-0.2 mg by mouth 2 (two) times daily. Takes 0.1mg  every morning and 0.2mg  at bedtime      . ferrous sulfate 325 (65 FE) MG tablet Take 325 mg by mouth daily with breakfast.       . furosemide (LASIX) 80 MG tablet Take 80 mg by mouth 2 (two) times daily.      Marland Kitchen gabapentin (NEURONTIN) 300 MG capsule Take 300 mg by mouth 2 (two) times daily.      . hydrALAZINE (APRESOLINE) 100 MG tablet Take 100 mg by mouth 3 (three) times daily.      Marland Kitchen HYDROcodone-acetaminophen (NORCO/VICODIN) 5-325 MG per tablet Take 1 tablet by mouth every 6 (six) hours as needed for moderate pain.      Marland Kitchen ibuprofen (ADVIL,MOTRIN) 600 MG tablet Take 600 mg by mouth every 6 (six) hours as needed.      . insulin glargine (LANTUS) 100 UNIT/ML injection Inject 10 Units into the skin at bedtime.      . insulin lispro (HUMALOG) 100 UNIT/ML injection Inject 3-10 Units into the skin 3 (three) times daily before meals. Sliding scale.  CBG 200: 3 UNITS, 250 5 UNITS, 300 7 UNITS, 350 9-10 UNITS      . isosorbide mononitrate (IMDUR) 120 MG 24 hr tablet Take 120 mg by mouth daily.      . metolazone (ZAROXOLYN) 5 MG tablet Take 5 mg by mouth 2 (two) times daily.      . nitroGLYCERIN (NITROSTAT) 0.4 MG SL tablet Place 0.4 mg under the tongue every 5 (five) minutes as needed for chest pain.       . pantoprazole (PROTONIX) 40 MG tablet Take 1 tablet (40 mg total) by mouth 2 (two) times daily.  60 tablet  1  . penicillin v potassium (VEETID) 500 MG tablet Take 500 mg by mouth 4 (four) times daily. For 10 days      . polyethylene glycol (MIRALAX / GLYCOLAX) packet Take 17 g by mouth daily as needed (for constipation).  14 each  0  . promethazine (PHENERGAN) 25 MG tablet Take 25 mg by mouth every 6 (six) hours as needed for nausea or vomiting.      . traMADol (ULTRAM) 50 MG tablet Take 50 mg by mouth 3 (three) times daily as needed for pain.        Assessment/Plan: 1 ESRD for full HD in am.  Still xs vol. 2 Anemia  epo 3 HTN lower vol , cont meds 4 DM fair control.  No evidence DKA 5 CAD 6 OSA 7 Hx GIB 8 Abdm pain ??? Etio. Not clear P HD, check lactate, check CK, Trop    LOS: 2 days   Bhavya Grand  L 01/09/2014,7:30 AM

## 2014-01-10 DIAGNOSIS — N189 Chronic kidney disease, unspecified: Secondary | ICD-10-CM

## 2014-01-10 DIAGNOSIS — N2581 Secondary hyperparathyroidism of renal origin: Secondary | ICD-10-CM | POA: Diagnosis present

## 2014-01-10 DIAGNOSIS — E119 Type 2 diabetes mellitus without complications: Secondary | ICD-10-CM

## 2014-01-10 DIAGNOSIS — D631 Anemia in chronic kidney disease: Secondary | ICD-10-CM | POA: Diagnosis present

## 2014-01-10 DIAGNOSIS — R109 Unspecified abdominal pain: Secondary | ICD-10-CM | POA: Diagnosis present

## 2014-01-10 LAB — GLUCOSE, CAPILLARY
GLUCOSE-CAPILLARY: 197 mg/dL — AB (ref 70–99)
GLUCOSE-CAPILLARY: 194 mg/dL — AB (ref 70–99)
Glucose-Capillary: 135 mg/dL — ABNORMAL HIGH (ref 70–99)
Glucose-Capillary: 150 mg/dL — ABNORMAL HIGH (ref 70–99)
Glucose-Capillary: 196 mg/dL — ABNORMAL HIGH (ref 70–99)

## 2014-01-10 LAB — COMPREHENSIVE METABOLIC PANEL
ALT: 22 U/L (ref 0–35)
AST: 40 U/L — ABNORMAL HIGH (ref 0–37)
Albumin: 2.9 g/dL — ABNORMAL LOW (ref 3.5–5.2)
Alkaline Phosphatase: 114 U/L (ref 39–117)
Anion gap: 17 — ABNORMAL HIGH (ref 5–15)
BILIRUBIN TOTAL: 0.3 mg/dL (ref 0.3–1.2)
BUN: 49 mg/dL — ABNORMAL HIGH (ref 6–23)
CALCIUM: 8.9 mg/dL (ref 8.4–10.5)
CO2: 24 meq/L (ref 19–32)
CREATININE: 5.9 mg/dL — AB (ref 0.50–1.10)
Chloride: 101 mEq/L (ref 96–112)
GFR calc non Af Amer: 7 mL/min — ABNORMAL LOW (ref >90)
GFR, EST AFRICAN AMERICAN: 8 mL/min — AB (ref >90)
Glucose, Bld: 129 mg/dL — ABNORMAL HIGH (ref 70–99)
Potassium: 4.4 mEq/L (ref 3.7–5.3)
Sodium: 142 mEq/L (ref 137–147)
Total Protein: 6.8 g/dL (ref 6.0–8.3)

## 2014-01-10 LAB — CBC
HCT: 37 % (ref 36.0–46.0)
Hemoglobin: 12 g/dL (ref 12.0–15.0)
MCH: 28.8 pg (ref 26.0–34.0)
MCHC: 32.4 g/dL (ref 30.0–36.0)
MCV: 88.9 fL (ref 78.0–100.0)
Platelets: 193 10*3/uL (ref 150–400)
RBC: 4.16 MIL/uL (ref 3.87–5.11)
RDW: 14.9 % (ref 11.5–15.5)
WBC: 8.5 10*3/uL (ref 4.0–10.5)

## 2014-01-10 LAB — AMYLASE: AMYLASE: 77 U/L (ref 0–105)

## 2014-01-10 LAB — LIPASE, BLOOD: Lipase: 21 U/L (ref 11–59)

## 2014-01-10 MED ORDER — ACETAMINOPHEN 325 MG PO TABS
ORAL_TABLET | ORAL | Status: AC
  Filled 2014-01-10: qty 2

## 2014-01-10 MED ORDER — DOXERCALCIFEROL 4 MCG/2ML IV SOLN
INTRAVENOUS | Status: AC
  Filled 2014-01-10: qty 2

## 2014-01-10 NOTE — Progress Notes (Signed)
[facility]                                                                              Patient Demographics  Madeline [name], is a 58 y.o. female, DOB - Mar 25, 1956, ZOX:[id number]  Admit date - 01/07/2014   Admitting Physician Edsel Petrin, DO  Outpatient Primary MD for the patient is Pamelia Hoit, MD  LOS - 3   Chief Complaint  Patient presents with  . Abdominal Pain      HPI on 01/07/2014 Madeline [name] is a 58 y.o. female with a history of end-stage renal disease, diabetes mellitus, hypertension, diastolic heart failure that presented to the emergency department for abdominal pain, nausea, vomiting. Patient's vomiting began on Wednesday this past week after dialyzing. Patient recently started dialyzing on Wednesday. Patient has been unable to keep anything down since Wednesday, and has not been taking her medications. Patient denied any recent ill contacts or travel. She denied any cough or fever or chills. Patient does complain of feeling tired and weak. She also complained of feeling the urge to urinate however unable to do so. Upon arrival to emergency department, patient was found to have a glucose of 425, CO2 20, and was started on glucose stabilizer. Her daughter was at bedside and stated that patient recently underwent tooth extraction on Tuesday. However was not taking any antibiotics post tooth extraction.   Assessment & Plan   Questionable Diabetic Ketoacidosis/increased anion gap metabolic acidosis  -Patient continues to have elevated gap, however bicarbonate 24 -Continue glucose stabilizer -Refraining from IVF at this time due history of diastolic heart failure as well as end-stage renal disease.  -Gap closed -Patient likely went to? DKA secondary to viral gastroenteritis versus newly starting hemodialysis. Patient has been vomiting since Wednesday, and not taking her medications.  -Hemoglobin A1c  12.3 -Chest x-ray: No acute abnormality  -Patient is afebrile no  leukocytosis  -UA not convincing for infection -Continue anti-emetics as needed - CBG are controlled on current regmine of Insulin sliding scale  and lantus.   Abdominal pain with nausea and vomiting  -Etiology unclear -CT of the abdomen and pelvis: Thoracic and abdominal aorta are unremarkable, no evidence of aneurysm or dissection, no pulmonary embolism, mild bladder distention, mild anasarca  -Place patient on antiemetics as well as pain medications as needed.  -Likely secondary to viral gastroenteritis, patient currently has no white count and is afebrile  -Denies any diarrhea at this time  -lactic acid 1.2 (unlikely ischemia as patient afebrile, CT scan of abdomen unimpressive, no leukocytosis, patient afebrile) -Patient's pain seems to dissipate as soon as pain medications are given. ? Malingering versus psychogenic -Will continue to monitor -Will continue Protonix as well as give her GI cocktail  Urinary Retention -Bladder scan 8/31 evening showed approximately 700 cc, in and out cath was conducted in the 50 cc were removed. -Will attempt voiding trial today and D/C foley.  End-stage renal disease on hemodialysis  -Nephrology consulted and appreciated. -Patient dialyzes on Monday, Wednesday, Friday.   Accelerated hypertension  -Likely secondary to not taking her home medications  -Continue amlodipine, Coreg, clonidine, hydralazine  -Will hold Lasix and metolazone  -Continue IV hydralazine as needed  for systolic blood pressures above 180   Hyperlipidemia  -Continue statin   Anemia secondary to chronic disease  -Continue ferrous sulfate  -Continue to monitor CBC   Chronic diastolic heart failure  -Echocardiogram in October 2014 shows an EF of 65%, diastolic dysfunction.  -Continue to monitor patient's daily weights, intake and output.  -Will refrain from providing IV fluids at this time.   GERD  -Continue Protonix   Code Status: full  Family Communication:  Granddaughter at bedside  Disposition Plan: Admitted, will likely transfer to the floor later today if patient remains stable  Time Spent in minutes   30 minutes  Procedures  None  Consults   Nephrology  DVT Prophylaxis  Heparin  Lab Results  Component Value Date   PLT 193 01/10/2014    Medications  Scheduled Meds: . acetaminophen      . amLODipine  10 mg Oral QHS  . aspirin EC  81 mg Oral Daily  . atorvastatin  40 mg Oral q1800  . calcium acetate (Phos Binder)  667 mg Oral TID WC  . carvedilol  25 mg Oral BID WC  . clotrimazole  10 mg Oral 5 X Daily  . doxercalciferol      . doxercalciferol  2 mcg Intravenous Q M,W,F-HD  . ferric gluconate (FERRLECIT/NULECIT) IV  125 mg Intravenous Q M,W,F-HD  . heparin  5,000 Units Subcutaneous 3 times per day  . hydrALAZINE  100 mg Oral 3 times per day  . insulin aspart  0-5 Units Subcutaneous QHS  . insulin aspart  0-9 Units Subcutaneous TID WC  . insulin glargine  10 Units Subcutaneous QHS  . isosorbide mononitrate  120 mg Oral Daily  . multivitamin  1 tablet Oral QHS  . pantoprazole (PROTONIX) IV  40 mg Intravenous Q12H   Continuous Infusions: . insulin (NOVOLIN-R) infusion Stopped (01/08/14 2212)   PRN Meds:.acetaminophen, acetaminophen, albuterol, dextrose, hydrALAZINE, morphine injection, nitroGLYCERIN, ondansetron (ZOFRAN) IV, ondansetron, pneumococcal 23 valent vaccine, promethazine  Antibiotics    Anti-infectives   None      Subjective:   Madeline [name] seen and examined today.  Patient states her abdominal pain is worsened. She is wintering in pain. Patient denies any nausea or vomiting. Currently denies any chest pain or shortness of breath.  Objective:   Filed Vitals:   01/10/14 1030 01/10/14 1100 01/10/14 1126 01/10/14 1130  BP: 126/72 140/81 132/78 161/82  Pulse: 63 63 63 63  Temp:    97.8 F (36.6 C)  TempSrc:    Oral  Resp: Height:      Weight:    55.2 kg (121 lb 11.1 oz)  SpO2:   100%  100%    Wt Readings from Last 3 Encounters:  01/10/14 55.2 kg (121 lb 11.1 oz)  12/21/13 63.504 kg (140 lb)  10/13/13 62.596 kg (138 lb)     Intake/Output Summary (Last 24 hours) at 01/10/14 1341 Last data filed at 01/10/14 1130  Gross per 24 hour  Intake    120 ml  Output   3100 ml  Net  -2980 ml    Exam General: Well developed, well nourished, moderate distress, appears stated age  HEENT: NCAT, mucous membranes moist.  Cardiovascular: S1 S2 auscultated, no rubs, murmurs or gallops. Regular rate and rhythm.  Respiratory: Clear to auscultation bilaterally with equal chest rise  Abdomen: Soft, diffuse mild tenderness, nondistended, + bowel sounds  Extremities: warm dry without cyanosis clubbing or edema,  LUE AVF  with +bruit/thrill Neuro: AAOx3, no focal deficits Skin: Without rashes exudates or nodules  Psych: Appropriate affect and demeanor  Data Review   Micro Results Recent Results (from the past 240 hour(s))  URINE CULTURE     Status: None   Collection Time    01/07/14  4:16 PM      Result Value Ref Range Status   Specimen Description URINE, RANDOM   Final   Special Requests ADDED [id number]   Final   Culture  Setup Time     Final   Value: 01/08/2014 12:30     Performed at [facility] Count     Final   Value: >=100,000 COLONIES/ML     Performed at [facility]   Culture     Final   Value: Multiple bacterial morphotypes present, none predominant. Suggest appropriate recollection if clinically indicated.     Performed at [facility]   Report Status 01/09/2014 FINAL   Final  MRSA PCR SCREENING     Status: None   Collection Time    01/07/14  6:15 PM      Result Value Ref Range Status   MRSA by PCR NEGATIVE  NEGATIVE Final   Comment:            The GeneXpert MRSA Assay (FDA     approved for NASAL specimens     only), is one component of a     comprehensive MRSA colonization     surveillance program. It is not     intended  to diagnose MRSA     infection nor to guide or     monitor treatment for     MRSA infections.    Radiology Reports Dg Chest 2 View  01/07/2014   CLINICAL DATA:  Right-sided chest pain  EXAM: CHEST  2 VIEW  COMPARISON:  12/21/1948  FINDINGS: Cardiac shadow remains mildly enlarged. The lungs are clear bilaterally. No focal infiltrate or sizable effusion is noted. No acute bony abnormality is noted.  IMPRESSION: No acute abnormality seen.   Electronically Signed   By: Alcide Clever M.D.   On: 01/07/2014 13:38   Dg Chest 2 View  12/21/2013   CLINICAL DATA:  Difficulty breathing  EXAM: CHEST  2 VIEW  COMPARISON:  Chest radiograph June 03, 2013; chest CT June 03, 2013  FINDINGS: There is no edema or consolidation. Heart is enlarged with pulmonary vascularity within normal limits. No adenopathy. There is atherosclerotic change in aorta. No bone lesions.  IMPRESSION: Cardiomegaly.  No edema or consolidation.   Electronically Signed   By: Bretta Bang M.D.   On: 12/21/2013 13:41   Ct Head Wo Contrast  12/21/2013   CLINICAL DATA:  Weakness and nausea for 2 days.  EXAM: CT HEAD WITHOUT CONTRAST  TECHNIQUE: Contiguous axial images were obtained from the base of the skull through the vertex without intravenous contrast.  COMPARISON:  03/15/2013.  FINDINGS: No mass lesion, mass effect, midline shift, hydrocephalus, hemorrhage. No territorial ischemia or acute infarction. Intracranial atherosclerosis is present. Calvarium intact. Visualized paranasal sinuses appear clear.  IMPRESSION: Negative CT head.   Electronically Signed   By: Andreas Newport M.D.   On: 12/21/2013 13:52   Ct Angio Chest Aorta W/cm &/or Wo/cm  01/07/2014   CLINICAL DATA:  Pain.  Evaluate for dissection.  EXAM: CT ANGIOGRAPHY CHEST, ABDOMEN AND PELVIS  TECHNIQUE: Multidetector CT imaging through the chest, abdomen and pelvis was performed using the standard  protocol during bolus administration of intravenous contrast. Multiplanar  reconstructed images and MIPs were obtained and reviewed to evaluate the vascular anatomy.  CONTRAST:  OMNIPAQUE IOHEXOL 350 MG/ML SOLN  COMPARISON:  CT 06/03/2013 and 02/13/2013 .  FINDINGS: CTA CHEST FINDINGS  Thoracic aortic atherosclerotic vascular disease. No aneurysm or dissection. Pulmonary arteries are normal. Coronary artery disease. Cardiomegaly. No significant adenopathy. The thoracic esophagus is dilated and contains fluid. Sliding hiatal hernia is present. Gastroesophageal reflux may be present. Further evaluation with nonemergent barium swallow can be obtained.  Large airways patent. Mild basilar atelectasis. No focal infiltrate. No pleural effusion or pneumothorax.  No acute thoracic bony abnormality identified. Visualized thyroid normal. No axillary adenopathy.  Review of the MIP images confirms the above findings.  CTA ABDOMEN AND PELVIS FINDINGS  Abdominal aorta is normal in caliber. No evidence of abdominal aortic aneurysm or dissection. Aortoiliac atherosclerotic vascular disease present. Visceral vessels are patent.  Liver normal. Spleen normal. Pancreas normal. Gallbladder is nondistended. Common bile duct upper limits normal at 7 mm.  Adrenals normal. No focal renal abnormality. No hydronephrosis or obstructing ureteral stone. Bladder slightly distended. Hysterectomy. No pelvic mass. Minimal amount of free pelvic fluid.  Appendix normal. No bowel distention. No free air. No mesenteric mass. Tiny inguinal hernias. Tiny umbilical hernia. No bowel herniation.  No acute bony abnormality identified. Degenerative changes lumbar spine. Diffuse mild anasarca.  Review of the MIP images confirms the above findings.  IMPRESSION: 1. Thoracic and abdominal aorta unremarkable. No evidence of aneurysm or dissection. No pulmonary embolus. 2. Coronary artery disease.  Cardiomegaly. 3. Sliding hiatal hernia with esophageal dilatation and fluid within the esophagus. Gastroesophageal reflux could present in  this fashion. Nonemergent barium swallow can be obtained to further evaluate. 4. Mild bladder distention. 5. Mild amount free pelvic fluid.  Mild anasarca.   Electronically Signed   By: Maisie Fus  Register   On: 01/07/2014 13:18   Ct Cta Abd/pel W/cm &/or W/o Cm  01/07/2014   CLINICAL DATA:  Pain.  Evaluate for dissection.  EXAM: CT ANGIOGRAPHY CHEST, ABDOMEN AND PELVIS  TECHNIQUE: Multidetector CT imaging through the chest, abdomen and pelvis was performed using the standard protocol during bolus administration of intravenous contrast. Multiplanar reconstructed images and MIPs were obtained and reviewed to evaluate the vascular anatomy.  CONTRAST:  OMNIPAQUE IOHEXOL 350 MG/ML SOLN  COMPARISON:  CT 06/03/2013 and 02/13/2013 .  FINDINGS: CTA CHEST FINDINGS  Thoracic aortic atherosclerotic vascular disease. No aneurysm or dissection. Pulmonary arteries are normal. Coronary artery disease. Cardiomegaly. No significant adenopathy. The thoracic esophagus is dilated and contains fluid. Sliding hiatal hernia is present. Gastroesophageal reflux may be present. Further evaluation with nonemergent barium swallow can be obtained.  Large airways patent. Mild basilar atelectasis. No focal infiltrate. No pleural effusion or pneumothorax.  No acute thoracic bony abnormality identified. Visualized thyroid normal. No axillary adenopathy.  Review of the MIP images confirms the above findings.  CTA ABDOMEN AND PELVIS FINDINGS  Abdominal aorta is normal in caliber. No evidence of abdominal aortic aneurysm or dissection. Aortoiliac atherosclerotic vascular disease present. Visceral vessels are patent.  Liver normal. Spleen normal. Pancreas normal. Gallbladder is nondistended. Common bile duct upper limits normal at 7 mm.  Adrenals normal. No focal renal abnormality. No hydronephrosis or obstructing ureteral stone. Bladder slightly distended. Hysterectomy. No pelvic mass. Minimal amount of free pelvic fluid.  Appendix normal. No  bowel distention. No free air. No mesenteric mass. Tiny inguinal hernias. Tiny umbilical hernia. No bowel herniation.  No acute bony abnormality identified. Degenerative changes lumbar spine. Diffuse mild anasarca.  Review of the MIP images confirms the above findings.  IMPRESSION: 1. Thoracic and abdominal aorta unremarkable. No evidence of aneurysm or dissection. No pulmonary embolus. 2. Coronary artery disease.  Cardiomegaly. 3. Sliding hiatal hernia with esophageal dilatation and fluid within the esophagus. Gastroesophageal reflux could present in this fashion. Nonemergent barium swallow can be obtained to further evaluate. 4. Mild bladder distention. 5. Mild amount free pelvic fluid.  Mild anasarca.   Electronically Signed   By: Maisie Fus  Register   On: 01/07/2014 13:18    CBC  Recent Labs Lab 01/07/14 1000 01/08/14 0345 01/09/14 0228 01/10/14 0500  WBC 9.4 11.6* 9.0 8.5  HGB 12.4 12.3 10.5* 12.0  HCT 37.6 36.8 33.2* 37.0  PLT 210 202 178 193  MCV 88.5 86.2 89.0 88.9  MCH 29.2 28.8 28.2 28.8  MCHC 33.0 33.4 31.6 32.4  RDW 13.8 13.8 14.6 14.9  LYMPHSABS 1.9  --   --   --   MONOABS 0.6  --   --   --   EOSABS 0.1  --   --   --   BASOSABS 0.0  --   --   --     Chemistries   Recent Labs Lab 01/07/14 1000  01/08/14 0900 01/08/14 1150 01/08/14 1915 01/09/14 0228 01/10/14 0500  NA 136*  < > 142 141 144 141 142  K 4.4  < > 3.9 3.7 4.8 4.1 4.4  CL 91*  < > 102 101 103 101 101  CO2 20  < > GLUCOSE 425*  < > 181* 134* 93 167* 129*  BUN 28*  < > 34* 36* 30* 33* 49*  CREATININE 2.69*  < > 3.39* 3.62* 3.71* 4.22* 5.90*  CALCIUM 9.8  < > 9.6 9.6 9.0 8.6 8.9  AST 39*  --   --   --   --   --  40*  ALT 22  --   --   --   --   --  22  ALKPHOS 144*  --   --   --   --   --  114  BILITOT 0.4  --   --   --   --   --  0.3  < > = values in this interval not  displayed. ------------------------------------------------------------------------------------------------------------------ estimated creatinine clearance is 7.6 ml/min (by C-G formula based on Cr of 5.9). ------------------------------------------------------------------------------------------------------------------ No results found for this basename: HGBA1C,  in the last 72 hours ------------------------------------------------------------------------------------------------------------------ No results found for this basename: CHOL, HDL, LDLCALC, TRIG, CHOLHDL, LDLDIRECT,  in the last 72 hours ------------------------------------------------------------------------------------------------------------------ No results found for this basename: TSH, T4TOTAL, FREET3, T3FREE, THYROIDAB,  in the last 72 hours ------------------------------------------------------------------------------------------------------------------ No results found for this basename: VITAMINB12, FOLATE, FERRITIN, TIBC, IRON, RETICCTPCT,  in the last 72 hours  Coagulation profile No results found for this basename: INR, PROTIME,  in the last 168 hours  No results found for this basename: DDIMER,  in the last 72 hours  Cardiac Enzymes  Recent Labs Lab 01/07/14 0922 01/09/14 0900  TROPONINI <0.30 <0.30   ------------------------------------------------------------------------------------------------------------------ No components found with this basename: Gordan Payment, Ebony Yorio MD on 01/10/2014 at 1:41 PM  Between 7am to 7pm - Pager [phone]  After 7pm go to [email or web address] - password [email or web address]  And look for the night coverage person covering for me after hours  [facility] Office  [phone]

## 2014-01-10 NOTE — Progress Notes (Signed)
Patient has refused the CPAP machine for tonight. Patient is resting well on RA, in no apparent distress at this time. CPAP machine is set up in patients room if she does change her mind about wearing the machine. RT will continue to assist as needed.

## 2014-01-10 NOTE — Procedures (Signed)
I was present at this session.  I have reviewed the session itself and made appropriate changes.  HD via AVF, flow is low, start BH, eval AVF branches.  Axyl Sitzman L 9/2/20159:22 AM

## 2014-01-10 NOTE — Progress Notes (Signed)
Subjective:  Still with mild nausea, but overall much better, no vomiting or diarrhea  Objective: Vital signs in last 24 hours: Temp:  [97.8 F (36.6 C)-98.9 F (37.2 C)] 98.1 F (36.7 C) (09/02 0711) Pulse Rate:  [66-81] 67 (09/02 0711) Resp:  [16-18] 16 (09/02 0711) BP: (140-197)/(69-86) 197/86 mmHg (09/02 0711) SpO2:  [99 %-100 %] 100 % (09/02 0711) Weight:  [57.8 kg (127 lb 6.8 oz)-58.001 kg (127 lb 13.9 oz)] 57.8 kg (127 lb 6.8 oz) (09/02 0711) Weight change: -0.999 kg (-2 lb 3.2 oz)  Intake/Output from previous day: 09/01 0701 - 09/02 0700 In: 240 [P.O.:240] Out: 200 [Urine:200] Intake/Output this shift:   Lab Results:  Recent Labs  01/09/14 0228 01/10/14 0500  WBC 9.0 8.5  HGB 10.5* 12.0  HCT 33.2* 37.0  PLT 178 193   BMET:  Recent Labs  01/07/14 1000  01/08/14 1915 01/09/14 0228  NA 136*  < > 144 141  K 4.4  < > 4.8 4.1  CL 91*  < > 103 101  CO2 20  < > 28 27  GLUCOSE 425*  < > 93 167*  BUN 28*  < > 30* 33*  CREATININE 2.69*  < > 3.71* 4.22*  CALCIUM 9.8  < > 9.0 8.6  ALBUMIN 3.5  --   --   --   < > = values in this interval not displayed. No results found for this basename: PTH,  in the last 72 hours Iron Studies: No results found for this basename: IRON, TIBC, TRANSFERRIN, FERRITIN,  in the last 72 hours  Studies/Results: No results found.  EXAM: General appearance:  Alert, in no apparent distress Resp:  CTA without rales, rhonchi, or wheezes Cardio:  Irregular rhythm, Gr II/VI holosystolic murmur, no rub GI: + BS, soft and nontender Extremities:  No edema Access:  AVF @ LUA with + bruit  Dialysis Orders: MWF @ [facility]  62kgs 2 hrs 2K/2Ca 1800 Heparin R AVF 200/1.5  hectorol 2 mcg IV/HD Aranesp60 q week Venofer 100 x10 (stop 9/21)  Assessment/Plan: 1. Abdominal pain w/ nausea & vomiting - imaging unremarkable, afebrile, possibly gastroenteritis, but etiology unclear, improving.  Per primary.  Mild LFT ^, ? viral 2. ESRD - HD on MWF @ [place], K  4.1.  HD pending. 3. HTN/Volume - BP 172/82 on Amlodipine 10 mg qhs, Carvedilol 25 mg bid, Hydralazine 100 mg q8h; below EDW.  Continue to lower wgt and meds 4. Anemia - Hgb 12, Aranesp on hold, Fe loading (through 9/21). 5. 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 Phoslyra with meals. 6. Nutrition - renal diet, multivitamin. 7. DM - Hgb A1C 12.8, but fairly good control since admission, no evidence of DKA. 8. OSA - on CPAP.  LOS: 3 days   [name],Madeline 01/10/2014,7:39 AM I have seen and examined this patient and agree with the plan of care seen, eval, counseled. Eval , AVF as outpatient.  [place].   .  Madeline [name] 01/10/2014, 9:21 AM

## 2014-01-10 NOTE — Progress Notes (Signed)
No urine output after foley was discontinued. Bladder scan equals 18 ml of urine. Will continue to monitor

## 2014-01-11 LAB — GLUCOSE, CAPILLARY
GLUCOSE-CAPILLARY: 336 mg/dL — AB (ref 70–99)
GLUCOSE-CAPILLARY: 254 mg/dL — AB (ref 70–99)

## 2014-01-11 MED ORDER — SODIUM CHLORIDE 0.9 % IV SOLN: 100.0000 mL | INTRAVENOUS | Status: DC | PRN

## 2014-01-11 MED ORDER — ALTEPLASE 2 MG IJ SOLR
2.0000 mg | Freq: Once | INTRAMUSCULAR | Status: DC | PRN
  Filled 2014-01-11: qty 2

## 2014-01-11 MED ORDER — LIDOCAINE-PRILOCAINE 2.5-2.5 % EX CREA: 1.0000 "application " | TOPICAL_CREAM | CUTANEOUS | Status: DC | PRN

## 2014-01-11 MED ORDER — HEPARIN SODIUM (PORCINE) 1000 UNIT/ML DIALYSIS
1800.0000 [IU] | Freq: Once | INTRAMUSCULAR | Status: DC
  Filled 2014-01-11: qty 2

## 2014-01-11 MED ORDER — HEPARIN SODIUM (PORCINE) 1000 UNIT/ML DIALYSIS: 1000.0000 [IU] | INTRAMUSCULAR | Status: DC | PRN

## 2014-01-11 MED ORDER — LIDOCAINE HCL (PF) 1 % IJ SOLN: 5.0000 mL | INTRAMUSCULAR | Status: DC | PRN

## 2014-01-11 MED ORDER — NEPRO/CARBSTEADY PO LIQD: 237.0000 mL | ORAL | Status: DC | PRN

## 2014-01-11 MED ORDER — PENTAFLUOROPROP-TETRAFLUOROETH EX AERO: 1.0000 "application " | INHALATION_SPRAY | CUTANEOUS | Status: DC | PRN

## 2014-01-11 NOTE — Discharge Instructions (Signed)
Follow with Primary MD Pamelia Hoit, MD in 7 days   Get CBC, CMP, 2 view Chest X ray checked  by Primary MD next visit.    Activity: As tolerated with Full fall precautions use walker/cane & assistance as needed   Disposition Home    Diet: renal diet , feeding assistance and aspiration precautions if needed.  For Heart failure patients - Check your Weight same time everyday, if you gain over 2 pounds, or you develop in leg swelling, experience more shortness of breath or chest pain, call your Primary MD immediately. Follow Cardiac Low Salt Diet and 1.8 lit/day fluid restriction.   On your next visit with her primary care physician please Get Medicines reviewed and adjusted.  Please request your Prim.MD to go over all Hospital Tests and Procedure/Radiological results at the follow up, please get all Hospital records sent to your Prim MD by signing hospital release before you go home.   If you experience worsening of your admission symptoms, develop shortness of breath, life threatening emergency, suicidal or homicidal thoughts you must seek medical attention immediately by calling 911 or calling your MD immediately  if symptoms less severe.  You Must read complete instructions/literature along with all the possible adverse reactions/side effects for all the Medicines you take and that have been prescribed to you. Take any new Medicines after you have completely understood and accpet all the possible adverse reactions/side effects.   Do not drive, operating heavy machinery, perform activities at heights, swimming or participation in water activities or provide baby sitting services if your were admitted for syncope or siezures until you have seen by Primary MD or a Neurologist and advised to do so again.  Do not drive when taking Pain medications.    Do not take more than prescribed Pain, Sleep and Anxiety Medications  Special Instructions: If you have smoked or chewed Tobacco  in  the last 2 yrs please stop smoking, stop any regular Alcohol  and or any Recreational drug use.  Wear Seat belts while driving.   Please note  You were cared for by a hospitalist during your hospital stay. If you have any questions about your discharge medications or the care you received while you were in the hospital after you are discharged, you can call the unit and asked to speak with the hospitalist on call if the hospitalist that took care of you is not available. Once you are discharged, your primary care physician will handle any further medical issues. Please note that NO REFILLS for any discharge medications will be authorized once you are discharged, as it is imperative that you return to your primary care physician (or establish a relationship with a primary care physician if you do not have one) for your aftercare needs so that they can reassess your need for medications and monitor your lab values.

## 2014-01-11 NOTE — Progress Notes (Signed)
Pt discharge instructions given. Pt and family verbalized understanding.  VSS.  Denies pain.  Pleasant.  Pt left floor via wheelchair accompanied by staff and family.

## 2014-01-11 NOTE — Progress Notes (Signed)
Subjective:  No current complaints, no nausea or vomiting, tolerating liquid diet  Objective: Vital signs in last 24 hours: Temp:  [97.8 F (36.6 C)-98.6 F (37 C)] 98.3 F (36.8 C) (09/03 0538) Pulse Rate:  [62-77] 63 (09/03 0538) Resp:  [12-16] 16 (09/03 0538) BP: (126-197)/(60-88) 145/60 mmHg (09/03 0538) SpO2:  [99 %-100 %] 100 % (09/03 0538) Weight:  [55.2 kg (121 lb 11.1 oz)-57.8 kg (127 lb 6.8 oz)] 56.3 kg (124 lb 1.9 oz) (09/02 2107) Weight change: -0.201 kg (-7.1 oz)  Intake/Output from previous day: 09/02 0701 - 09/03 0700 In: 480 [P.O.:480] Out: 3020 [Urine:20] Intake/Output this shift: Total I/O In: 240 [P.O.:240] Out: -   Lab Results:  Recent Labs  01/09/14 0228 01/10/14 0500  WBC 9.0 8.5  HGB 10.5* 12.0  HCT 33.2* 37.0  PLT 178 193   BMET:  Recent Labs  01/09/14 0228 01/10/14 0500  NA 141 142  K 4.1 4.4  CL 101 101  CO2 27 24  GLUCOSE 167* 129*  BUN 33* 49*  CREATININE 4.22* 5.90*  CALCIUM 8.6 8.9  ALBUMIN  --  2.9*   No results found for this basename: PTH,  in the last 72 hours Iron Studies: No results found for this basename: IRON, TIBC, TRANSFERRIN, FERRITIN,  in the last 72 hours  Studies/Results: No results found.  EXAM:  General appearance: Alert, in no apparent distress  Resp: CTA without rales, rhonchi, or wheezes  Cardio: Irregular rhythm, Gr II/VI holosystolic murmur, no rub  GI: + BS, soft and nontender liver down 5 cm Extremities: No edema  Access: AVF @ LUA with + bruit   Dialysis Orders: MWF @ [facility]  62kgs 2 hrs 2K/2Ca 1800 Heparin R AVF 200/1.5  hectorol 2 mcg IV/HD Aranesp60 q week Venofer 100 x10 (stop 9/21)  Assessment/Plan: 1. Abdominal pain w/ nausea & vomiting - imaging unremarkable, afebrile, possibly viral gastroenteritis, but etiology unclear, improving. Per primary. 2. ESRD - HD on MWF @ [place], K 4.4.  Next HD tomorrow.  3. HTN/Volume - BP 145/60 on Amlodipine 10 mg qhs, Carvedilol 25 mg bid, Hydralazine 100  mg q8h; below EDW, wt 56.3 kg s/p net UF 3 L. Continue to lower wt and meds.  4. Anemia - Hgb 12, Aranesp on hold, Fe loading (through 9/21).  5. 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 Phoslyra with meals.  6. Nutrition - renal diet, multivitamin.  7. DM - Hgb A1C 12.8, but fairly good control since admission, no evidence of DKA.  8. OSA - on CPAP at home.    LOS: 4 days   LYLES,CHARLES 01/11/2014,6:32 AM I have seen and examined this patient and agree with the plan of care  Seen, examined, eval, Ok to d/c afterHD .  Octavia Velador L 01/11/2014, 9:48 AM

## 2014-01-11 NOTE — Discharge Summary (Signed)
Madeline [name], 58 y.o., DOB 06-15-1955, MRN [id number]. Admission date: 01/07/2014 Discharge Date 01/11/2014 Primary MD Pamelia Hoit, MD Admitting Physician Edsel Petrin, DO  Admission Diagnosis  Chronic diastolic heart failure [428.32] End stage renal disease [585.6] Accelerated hypertension [401.0] Anemia, chronic disease [285.29] OSA on CPAP [327.23] Diabetic ketoacidosis without coma associated with type 2 diabetes mellitus [250.12] Diabetic ketoacidosis without coma associated with other specified diabetes mellitus [250.12]  Discharge Diagnosis   Principal Problem:   Pain in the abdomen Active Problems:   Hyperglycemia   Anemia of chronic renal failure   Secondary hyperparathyroidism      Past Medical History  Diagnosis Date  . CHF (congestive heart failure)     a. HFpEF with RHF/anasarca/pHTN.  . COPD (chronic obstructive pulmonary disease)   . Coronary artery disease     a. Per [facility] records: NSTEMI 01/2012, tx medically given ARF but suspected CAD. b. Stress test 12/16/11 reported w/o ischemia.  . Hypertension   . Diabetes mellitus   . OSA (obstructive sleep apnea)     a. Pt reported used to use CPAP in [place], but "ran out" when came to [facility].  Marland Kitchen CKD (chronic kidney disease), stage III     a. Per [place] records: h/o ARF after CTA that ruled out PE.  Marland Kitchen Pulmonary hypertension     a. RHC 02/28/13: mod pulm HTN with normal PVR suggestive of predominantly pulmonary venous HTN.  . Right heart failure   . Anasarca     a. Per [facility] records - due to pulm HTN with R HF.   Marland Kitchen Aseptic meningitis     a. 09/2012: adm in [place] for metabolic encephalopathy, oliguric tubular necrosis, anemia, HTN, possible CVA, HHNKA.  Marland Kitchen CVA (cerebral infarction)     a. Per [facility] records, "possible CVA" 09/2012 but MRI reportedly negative.  . Abnormal Doppler ultrasound of carotid artery     a. Per [place] records: <50% LICA.  Marland Kitchen Renal insufficiency     Past Surgical History  Procedure Laterality Date  . Tubal ligation     . Abdominal hysterectomy    . Esophagogastroduodenoscopy N/A 02/16/2013    Procedure: ESOPHAGOGASTRODUODENOSCOPY (EGD);  Surgeon: Graylin Shiver, MD;  Location: [facility] ENDOSCOPY;  Service: Endoscopy;  Laterality: N/A;  . Insertion of dialysis catheter Right 03/03/2013    Procedure: INSERTION OF DIALYSIS CATHETER;  Surgeon: Larina Earthly, MD;  Location: [facility] OR;  Service: Vascular;  Laterality: Right;  Right Internal Jugular Placement  . Av fistula placement Left 03/03/2013    Procedure: ARTERIOVENOUS (AV) FISTULA CREATION, Brachial/Cephalic;  Surgeon: Larina Earthly, MD;  Location: [facility] OR;  Service: Vascular;  Laterality: Left;   HPI: Madeline [name] is a 58 y.o. female  With a history of end-stage renal disease, diabetes mellitus, hypertension, diastolic heart failure that presented to the emergency department for abdominal pain, nausea, vomiting. Patient's vomiting began on Wednesday this past week after dialyzing. Patient recently started dialyzing on Wednesday. Patient has been unable to keep anything down since Wednesday, and has not been taking her medications. Patient denies any recent ill contacts or travel. She denies any cough or fever or chills. Patient does complain of feeling tired and weak. He also complains of feeling the urge to urinate however unable to do so. Upon arrival to emergency department, patient was found to have a glucose of 425, CO2 20, and was started on glucose stabilizer.  Her daughter at bedside, patient recently underwent tooth extraction on Tuesday. However was not taking  any antibiotics post tooth extraction.   Hospital Course See H&P, Labs, Consult and Test reports for all details in brief, patient was admitted for diabetic ketoacidosis.   Questionable Diabetic Ketoacidosis/increased anion gap metabolic acidosis   -Patient likely went to? DKA secondary to viral gastroenteritis versus newly starting hemodialysis. Patient has been vomiting , and not taking her medications. Was  started on insulin drip, and an anion gap closed and bicarbonate came within normal limit. -Hemoglobin A1c 12.3  -Chest x-ray: No acute abnormality  -Patient is afebrile no leukocytosis  -UA not convincing for infection    Abdominal pain with nausea and vomiting  -Etiology unclear  -CT of the abdomen and pelvis: Thoracic and abdominal aorta are unremarkable, no evidence of aneurysm or dissection, no pulmonary embolism, mild bladder distention, mild anasarca  -Place patient was on antiemetics as well as pain medications as needed.  -Likely secondary to viral gastroenteritis, patient currently has no white count and is afebrile  -Denies any diarrhea -lactic acid 1.2 (unlikely ischemia as patient afebrile, CT scan of abdomen unimpressive, no leukocytosis, patient afebrile)    Urinary Retention  -Bladder scan 8/31 evening showed approximately 700 cc, in and out cath was conducted in the 50 cc were removed.  -Had a voiding trial yesterday with no retention  End-stage renal disease on hemodialysis  -Nephrology consulted and appreciated.  -Patient dialyzes on Monday, Wednesday, Friday.   Accelerated hypertension  -Likely secondary to not taking her home medications  -Continue amlodipine, Coreg, clonidine, hydralazine , will resume her back on diuretics upon discharge.   Hyperlipidemia  -Continue statin   Anemia secondary to chronic disease  -Continue ferrous sulfate    Chronic diastolic heart failure  -Echocardiogram in October 2014 shows an EF of 65%, diastolic dysfunction.  -Continue to monitor patient's daily weights, intake and output.   GERD  -Continue Protonix   Principal Problem:   Pain in the abdomen Active Problems:   Hyperglycemia   Anemia of chronic renal failure   Secondary hyperparathyroidism    Consults   Nephrology  Significant Tests:  See full reports for all details    Dg Chest 2 View  01/07/2014   CLINICAL DATA:  Right-sided chest pain  EXAM:  CHEST  2 VIEW  COMPARISON:  12/21/1948  FINDINGS: Cardiac shadow remains mildly enlarged. The lungs are clear bilaterally. No focal infiltrate or sizable effusion is noted. No acute bony abnormality is noted.  IMPRESSION: No acute abnormality seen.   Electronically Signed   By: Alcide Clever M.D.   On: 01/07/2014 13:38   Dg Chest 2 View  12/21/2013   CLINICAL DATA:  Difficulty breathing  EXAM: CHEST  2 VIEW  COMPARISON:  Chest radiograph June 03, 2013; chest CT June 03, 2013  FINDINGS: There is no edema or consolidation. Heart is enlarged with pulmonary vascularity within normal limits. No adenopathy. There is atherosclerotic change in aorta. No bone lesions.  IMPRESSION: Cardiomegaly.  No edema or consolidation.   Electronically Signed   By: Bretta Bang M.D.   On: 12/21/2013 13:41   Ct Head Wo Contrast  12/21/2013   CLINICAL DATA:  Weakness and nausea for 2 days.  EXAM: CT HEAD WITHOUT CONTRAST  TECHNIQUE: Contiguous axial images were obtained from the base of the skull through the vertex without intravenous contrast.  COMPARISON:  03/15/2013.  FINDINGS: No mass lesion, mass effect, midline shift, hydrocephalus, hemorrhage. No territorial ischemia or acute infarction. Intracranial atherosclerosis is present. Calvarium intact. Visualized paranasal sinuses appear  clear.  IMPRESSION: Negative CT head.   Electronically Signed   By: Andreas Newport M.D.   On: 12/21/2013 13:52   Ct Angio Chest Aorta W/cm &/or Wo/cm  01/07/2014   CLINICAL DATA:  Pain.  Evaluate for dissection.  EXAM: CT ANGIOGRAPHY CHEST, ABDOMEN AND PELVIS  TECHNIQUE: Multidetector CT imaging through the chest, abdomen and pelvis was performed using the standard protocol during bolus administration of intravenous contrast. Multiplanar reconstructed images and MIPs were obtained and reviewed to evaluate the vascular anatomy.  CONTRAST:  OMNIPAQUE IOHEXOL 350 MG/ML SOLN  COMPARISON:  CT 06/03/2013 and 02/13/2013 .  FINDINGS: CTA  CHEST FINDINGS  Thoracic aortic atherosclerotic vascular disease. No aneurysm or dissection. Pulmonary arteries are normal. Coronary artery disease. Cardiomegaly. No significant adenopathy. The thoracic esophagus is dilated and contains fluid. Sliding hiatal hernia is present. Gastroesophageal reflux may be present. Further evaluation with nonemergent barium swallow can be obtained.  Large airways patent. Mild basilar atelectasis. No focal infiltrate. No pleural effusion or pneumothorax.  No acute thoracic bony abnormality identified. Visualized thyroid normal. No axillary adenopathy.  Review of the MIP images confirms the above findings.  CTA ABDOMEN AND PELVIS FINDINGS  Abdominal aorta is normal in caliber. No evidence of abdominal aortic aneurysm or dissection. Aortoiliac atherosclerotic vascular disease present. Visceral vessels are patent.  Liver normal. Spleen normal. Pancreas normal. Gallbladder is nondistended. Common bile duct upper limits normal at 7 mm.  Adrenals normal. No focal renal abnormality. No hydronephrosis or obstructing ureteral stone. Bladder slightly distended. Hysterectomy. No pelvic mass. Minimal amount of free pelvic fluid.  Appendix normal. No bowel distention. No free air. No mesenteric mass. Tiny inguinal hernias. Tiny umbilical hernia. No bowel herniation.  No acute bony abnormality identified. Degenerative changes lumbar spine. Diffuse mild anasarca.  Review of the MIP images confirms the above findings.  IMPRESSION: 1. Thoracic and abdominal aorta unremarkable. No evidence of aneurysm or dissection. No pulmonary embolus. 2. Coronary artery disease.  Cardiomegaly. 3. Sliding hiatal hernia with esophageal dilatation and fluid within the esophagus. Gastroesophageal reflux could present in this fashion. Nonemergent barium swallow can be obtained to further evaluate. 4. Mild bladder distention. 5. Mild amount free pelvic fluid.  Mild anasarca.   Electronically Signed   By: Maisie Fus   Register   On: 01/07/2014 13:18   Ct Cta Abd/pel W/cm &/or W/o Cm  01/07/2014   CLINICAL DATA:  Pain.  Evaluate for dissection.  EXAM: CT ANGIOGRAPHY CHEST, ABDOMEN AND PELVIS  TECHNIQUE: Multidetector CT imaging through the chest, abdomen and pelvis was performed using the standard protocol during bolus administration of intravenous contrast. Multiplanar reconstructed images and MIPs were obtained and reviewed to evaluate the vascular anatomy.  CONTRAST:  OMNIPAQUE IOHEXOL 350 MG/ML SOLN  COMPARISON:  CT 06/03/2013 and 02/13/2013 .  FINDINGS: CTA CHEST FINDINGS  Thoracic aortic atherosclerotic vascular disease. No aneurysm or dissection. Pulmonary arteries are normal. Coronary artery disease. Cardiomegaly. No significant adenopathy. The thoracic esophagus is dilated and contains fluid. Sliding hiatal hernia is present. Gastroesophageal reflux may be present. Further evaluation with nonemergent barium swallow can be obtained.  Large airways patent. Mild basilar atelectasis. No focal infiltrate. No pleural effusion or pneumothorax.  No acute thoracic bony abnormality identified. Visualized thyroid normal. No axillary adenopathy.  Review of the MIP images confirms the above findings.  CTA ABDOMEN AND PELVIS FINDINGS  Abdominal aorta is normal in caliber. No evidence of abdominal aortic aneurysm or dissection. Aortoiliac atherosclerotic vascular disease present. Visceral vessels are patent.  Liver normal. Spleen normal. Pancreas normal. Gallbladder is nondistended. Common bile duct upper limits normal at 7 mm.  Adrenals normal. No focal renal abnormality. No hydronephrosis or obstructing ureteral stone. Bladder slightly distended. Hysterectomy. No pelvic mass. Minimal amount of free pelvic fluid.  Appendix normal. No bowel distention. No free air. No mesenteric mass. Tiny inguinal hernias. Tiny umbilical hernia. No bowel herniation.  No acute bony abnormality identified. Degenerative changes lumbar spine.  Diffuse mild anasarca.  Review of the MIP images confirms the above findings.  IMPRESSION: 1. Thoracic and abdominal aorta unremarkable. No evidence of aneurysm or dissection. No pulmonary embolus. 2. Coronary artery disease.  Cardiomegaly. 3. Sliding hiatal hernia with esophageal dilatation and fluid within the esophagus. Gastroesophageal reflux could present in this fashion. Nonemergent barium swallow can be obtained to further evaluate. 4. Mild bladder distention. 5. Mild amount free pelvic fluid.  Mild anasarca.   Electronically Signed   By: Maisie Fus  Register   On: 01/07/2014 13:18     Today   Subjective:   Madeline [name] today has no headache,no chest abdominal pain,no new weakness tingling or numbness, feels much better wants to go home today.   Objective:   Blood pressure 125/58, pulse 64, temperature 98.3 F (36.8 C), temperature source Oral, resp. rate 16, height  (1.448 m), weight 56.3 kg (124 lb 1.9 oz), SpO2 100.00%.  Intake/Output Summary (Last 24 hours) at 01/11/14 1307 Last data filed at 01/10/14 1916  Gross per 24 hour  Intake    480 ml  Output     20 ml  Net    460 ml    Exam Awake Alert, Oriented *3, No new F.N deficits, Normal affect [place].AT,PERRAL Supple Neck,No JVD, No cervical lymphadenopathy appriciated.  Symmetrical Chest wall movement, Good air movement bilaterally, CTAB RRR,No Gallops,Rubs or new Murmurs, No Parasternal Heave +ve B.Sounds, Abd Soft, Non tender, No organomegaly appriciated, No rebound -guarding or rigidity. No Cyanosis, Clubbing or edema, No new Rash or bruise  Data Review     CBC w Diff: Lab Results  Component Value Date   WBC 8.5 01/10/2014   HGB 12.0 01/10/2014   HCT 37.0 01/10/2014   PLT 193 01/10/2014   LYMPHOPCT 21 01/07/2014   MONOPCT 6 01/07/2014   EOSPCT 1 01/07/2014   BASOPCT 0 01/07/2014   CMP: Lab Results  Component Value Date   NA 142 01/10/2014   K 4.4 01/10/2014   CL 101 01/10/2014   CO2 24 01/10/2014   BUN 49* 01/10/2014    CREATININE 5.90* 01/10/2014   PROT 6.8 01/10/2014   ALBUMIN 2.9* 01/10/2014   BILITOT 0.3 01/10/2014   ALKPHOS 114 01/10/2014   AST 40* 01/10/2014   ALT 22 01/10/2014  .  Micro Results Recent Results (from the past 240 hour(s))  URINE CULTURE     Status: None   Collection Time    01/07/14  4:16 PM      Result Value Ref Range Status   Specimen Description URINE, RANDOM   Final   Special Requests ADDED [id number] [phone]   Final   Culture  Setup Time     Final   Value: 01/08/2014 12:30     Performed at [facility] Count     Final   Value: >=100,000 COLONIES/ML     Performed at [facility]   Culture     Final   Value: Multiple bacterial morphotypes present, none predominant. Suggest appropriate recollection if clinically indicated.  Performed at [facility]   Report Status 01/09/2014 FINAL   Final  MRSA PCR SCREENING     Status: None   Collection Time    01/07/14  6:15 PM      Result Value Ref Range Status   MRSA by PCR NEGATIVE  NEGATIVE Final   Comment:            The GeneXpert MRSA Assay (FDA     approved for NASAL specimens     only), is one component of a     comprehensive MRSA colonization     surveillance program. It is not     intended to diagnose MRSA     infection nor to guide or     monitor treatment for     MRSA infections.      Follow-up Information   Follow up with Pamelia Hoit, MD In 1 week.   Specialty:  Family Medicine   Contact information:   [address] [place] [address] [phone]       Discharge Medications     Medication List    STOP taking these medications       ibuprofen 600 MG tablet  Commonly known as:  ADVIL,MOTRIN      TAKE these medications       acetaminophen 500 MG tablet  Commonly known as:  TYLENOL  Take 500-1,000 mg by mouth every 6 (six) hours as needed for moderate pain.     albuterol 108 (90 BASE) MCG/ACT inhaler  Commonly known as:  PROVENTIL HFA;VENTOLIN HFA  Inhale 2  puffs into the lungs every 6 (six) hours as needed for wheezing or shortness of breath.     amLODipine 10 MG tablet  Commonly known as:  NORVASC  Take 10 mg by mouth daily.     aspirin EC 81 MG tablet  Take 81 mg by mouth daily.     calcitRIOL 0.25 MCG capsule  Commonly known as:  ROCALTROL  Take 0.25 mcg by mouth daily.     calcium acetate 667 MG capsule  Commonly known as:  PHOSLO  Take 667 mg by mouth 3 (three) times daily with meals.     carvedilol 25 MG tablet  Commonly known as:  COREG  Take 25 mg by mouth 2 (two) times daily with a meal.     cloNIDine 0.1 MG tablet  Commonly known as:  CATAPRES  Take 0.1-0.2 mg by mouth 2 (two) times daily. Takes 0.1mg  every morning and 0.2mg  at bedtime     ferrous sulfate 325 (65 FE) MG tablet  Take 325 mg by mouth daily with breakfast.     furosemide 80 MG tablet  Commonly known as:  LASIX  Take 80 mg by mouth 2 (two) times daily.     gabapentin 300 MG capsule  Commonly known as:  NEURONTIN  Take 300 mg by mouth 2 (two) times daily.     hydrALAZINE 100 MG tablet  Commonly known as:  APRESOLINE  Take 100 mg by mouth 3 (three) times daily.     HYDROcodone-acetaminophen 5-325 MG per tablet  Commonly known as:  NORCO/VICODIN  Take 1 tablet by mouth every 6 (six) hours as needed for moderate pain.     insulin glargine 100 UNIT/ML injection  Commonly known as:  LANTUS  Inject 10 Units into the skin at bedtime.     insulin lispro 100 UNIT/ML injection  Commonly known as:  HUMALOG  Inject 3-10 Units into  the skin 3 (three) times daily before meals. Sliding scale.  CBG 200: 3 UNITS, 250 5 UNITS, 300 7 UNITS, 350 9-10 UNITS     isosorbide mononitrate 120 MG 24 hr tablet  Commonly known as:  IMDUR  Take 120 mg by mouth daily.     metolazone 5 MG tablet  Commonly known as:  ZAROXOLYN  Take 5 mg by mouth 2 (two) times daily.     nitroGLYCERIN 0.4 MG SL tablet  Commonly known as:  NITROSTAT  Place 0.4 mg under the tongue every  5 (five) minutes as needed for chest pain.     pantoprazole 40 MG tablet  Commonly known as:  PROTONIX  Take 1 tablet (40 mg total) by mouth 2 (two) times daily.     penicillin v potassium 500 MG tablet  Commonly known as:  VEETID  Take 500 mg by mouth 4 (four) times daily. For 10 days     polyethylene glycol packet  Commonly known as:  MIRALAX / GLYCOLAX  Take 17 g by mouth daily as needed (for constipation).     promethazine 25 MG tablet  Commonly known as:  PHENERGAN  Take 25 mg by mouth every 6 (six) hours as needed for nausea or vomiting.     traMADol 50 MG tablet  Commonly known as:  ULTRAM  Take 50 mg by mouth 3 (three) times daily as needed for pain.         Total Time in preparing paper work, data evaluation and todays exam - 35 minutes  Riel Hirschman M.D on 01/11/2014 at 1:07 PM  [facility] Office  [phone]

## 2014-01-11 NOTE — Progress Notes (Signed)
Paged Dr. Randol Kern, discharge needs some reconciliation, did he mean to discharge pt now?

## 2014-02-06 ENCOUNTER — Inpatient Hospital Stay (HOSPITAL_COMMUNITY)
Admission: EM | Admit: 2014-02-06 | Discharge: 2014-02-09 | DRG: 638 | Disposition: A | Discharge status: Home Health | Payer: Medicaid Other | Attending: Family Medicine | Admitting: Family Medicine

## 2014-02-06 ENCOUNTER — Emergency Department (HOSPITAL_COMMUNITY): Payer: Medicaid Other

## 2014-02-06 ENCOUNTER — Encounter (HOSPITAL_COMMUNITY): Payer: Self-pay | Admitting: Emergency Medicine

## 2014-02-06 DIAGNOSIS — Z6827 Body mass index (BMI) 27.0-27.9, adult: Secondary | ICD-10-CM

## 2014-02-06 DIAGNOSIS — N189 Chronic kidney disease, unspecified: Secondary | ICD-10-CM

## 2014-02-06 DIAGNOSIS — Z9989 Dependence on other enabling machines and devices: Secondary | ICD-10-CM

## 2014-02-06 DIAGNOSIS — D631 Anemia in chronic kidney disease: Secondary | ICD-10-CM | POA: Diagnosis present

## 2014-02-06 DIAGNOSIS — G4733 Obstructive sleep apnea (adult) (pediatric): Secondary | ICD-10-CM | POA: Diagnosis present

## 2014-02-06 DIAGNOSIS — Z8673 Personal history of transient ischemic attack (TIA), and cerebral infarction without residual deficits: Secondary | ICD-10-CM

## 2014-02-06 DIAGNOSIS — N2581 Secondary hyperparathyroidism of renal origin: Secondary | ICD-10-CM

## 2014-02-06 DIAGNOSIS — Z794 Long term (current) use of insulin: Secondary | ICD-10-CM

## 2014-02-06 DIAGNOSIS — E875 Hyperkalemia: Secondary | ICD-10-CM | POA: Diagnosis not present

## 2014-02-06 DIAGNOSIS — J449 Chronic obstructive pulmonary disease, unspecified: Secondary | ICD-10-CM | POA: Diagnosis present

## 2014-02-06 DIAGNOSIS — N186 End stage renal disease: Secondary | ICD-10-CM

## 2014-02-06 DIAGNOSIS — I5032 Chronic diastolic (congestive) heart failure: Secondary | ICD-10-CM | POA: Diagnosis present

## 2014-02-06 DIAGNOSIS — I2510 Atherosclerotic heart disease of native coronary artery without angina pectoris: Secondary | ICD-10-CM | POA: Diagnosis present

## 2014-02-06 DIAGNOSIS — T383X5A Adverse effect of insulin and oral hypoglycemic [antidiabetic] drugs, initial encounter: Secondary | ICD-10-CM | POA: Diagnosis present

## 2014-02-06 DIAGNOSIS — I5033 Acute on chronic diastolic (congestive) heart failure: Secondary | ICD-10-CM

## 2014-02-06 DIAGNOSIS — Z992 Dependence on renal dialysis: Secondary | ICD-10-CM

## 2014-02-06 DIAGNOSIS — N390 Urinary tract infection, site not specified: Secondary | ICD-10-CM | POA: Diagnosis present

## 2014-02-06 DIAGNOSIS — Z833 Family history of diabetes mellitus: Secondary | ICD-10-CM

## 2014-02-06 DIAGNOSIS — I10 Essential (primary) hypertension: Secondary | ICD-10-CM | POA: Diagnosis present

## 2014-02-06 DIAGNOSIS — F17220 Nicotine dependence, chewing tobacco, uncomplicated: Secondary | ICD-10-CM | POA: Diagnosis present

## 2014-02-06 DIAGNOSIS — K279 Peptic ulcer, site unspecified, unspecified as acute or chronic, without hemorrhage or perforation: Secondary | ICD-10-CM

## 2014-02-06 DIAGNOSIS — E871 Hypo-osmolality and hyponatremia: Secondary | ICD-10-CM | POA: Diagnosis present

## 2014-02-06 DIAGNOSIS — I272 Other secondary pulmonary hypertension: Secondary | ICD-10-CM | POA: Diagnosis present

## 2014-02-06 DIAGNOSIS — E1165 Type 2 diabetes mellitus with hyperglycemia: Secondary | ICD-10-CM | POA: Diagnosis not present

## 2014-02-06 DIAGNOSIS — E162 Hypoglycemia, unspecified: Secondary | ICD-10-CM | POA: Diagnosis present

## 2014-02-06 DIAGNOSIS — E876 Hypokalemia: Secondary | ICD-10-CM | POA: Diagnosis present

## 2014-02-06 DIAGNOSIS — E11649 Type 2 diabetes mellitus with hypoglycemia without coma: Principal | ICD-10-CM | POA: Diagnosis present

## 2014-02-06 DIAGNOSIS — R739 Hyperglycemia, unspecified: Secondary | ICD-10-CM

## 2014-02-06 DIAGNOSIS — R569 Unspecified convulsions: Secondary | ICD-10-CM

## 2014-02-06 DIAGNOSIS — I120 Hypertensive chronic kidney disease with stage 5 chronic kidney disease or end stage renal disease: Secondary | ICD-10-CM | POA: Diagnosis present

## 2014-02-06 DIAGNOSIS — I252 Old myocardial infarction: Secondary | ICD-10-CM

## 2014-02-06 DIAGNOSIS — IMO0002 Reserved for concepts with insufficient information to code with codable children: Secondary | ICD-10-CM

## 2014-02-06 LAB — URINE MICROSCOPIC-ADD ON

## 2014-02-06 LAB — URINALYSIS, ROUTINE W REFLEX MICROSCOPIC
Glucose, UA: >1000 mg/dL — AB
HGB URINE DIPSTICK: NEGATIVE
Ketones, ur: 15 mg/dL — AB
Nitrite: NEGATIVE
PH: 5 (ref 5.0–8.0)
Protein, ur: 100 mg/dL — AB
Specific Gravity, Urine: 1.023 (ref 1.005–1.030)
Urobilinogen, UA: 0.2 mg/dL (ref 0.0–1.0)

## 2014-02-06 LAB — CBG MONITORING, ED
GLUCOSE-CAPILLARY: 38 mg/dL — AB (ref 70–99)
GLUCOSE-CAPILLARY: 93 mg/dL (ref 70–99)

## 2014-02-06 MED ORDER — DEXTROSE 50 % IV SOLN
1.0000 | Freq: Once | INTRAVENOUS | Status: AC
  Administered 2014-02-06: 50 mL via INTRAVENOUS

## 2014-02-06 NOTE — ED Notes (Signed)
Patient presents to ED via [facility] from home. Upon EMS arrival patient was having "seizure like activity/jerking movements." EMS checked CBG and resulted 14. EMS administered D50 which brought patients CBG to 75. Patient is altered and non-verbal at this time. EKG unremarkable. VSS.

## 2014-02-06 NOTE — ED Notes (Signed)
PT IN CT

## 2014-02-06 NOTE — ED Notes (Signed)
Phlebotomy at bedside.

## 2014-02-06 NOTE — ED Provider Notes (Signed)
CSN: [id number]     Arrival date & time 02/06/14  2256 History   First MD Initiated Contact with Patient 02/06/14 2301     Chief Complaint  Patient presents with  . Seizures  . Altered Mental Status     (Consider location/radiation/quality/duration/timing/severity/associated sxs/prior Treatment) HPI Patient presents via EMS for seizure-like activity. CBG was checked and it was 14 on scene. Patient appeared altered and nonverbal possibly postictal. Was given D50 with improvement of CBG 75. This remains altered and is unable to contribute history. Level V caveat applies. Her granddaughter is at bedside patient had elevated blood sugar round clock this evening. She states it was roughly 190s. It is your normal subcutaneous dose of insulin. The patient was noted to have shaking movements. Unknown length of time. No previous seizure activity. Past Medical History  Diagnosis Date  . CHF (congestive heart failure)     a. HFpEF with RHF/anasarca/pHTN.  . COPD (chronic obstructive pulmonary disease)   . Coronary artery disease     a. Per [place] records: NSTEMI 01/2012, tx medically given ARF but suspected CAD. b. Stress test 12/16/11 reported w/o ischemia.  . Hypertension   . Diabetes mellitus   . OSA (obstructive sleep apnea)     a. Pt reported used to use CPAP in [place], but [facility]  Marland Kitchen. CKD (chronic kidney disease), stage III     a. Per [place] records: h/o ARF after CTA that ruled out PE.  Marland Kitchen. Pulmonary hypertension     a. RHC 02/28/13: mod pulm HTN with normal PVR suggestive of predominantly pulmonary venous HTN.  . Right heart failure   . Anasarca     a. Per [place] records - due to pulm HTN with R HF.   Marland Kitchen. Aseptic meningitis     a. 09/2012: adm in [place] for metabolic encephalopathy, oliguric tubular necrosis, anemia, HTN, possible CVA, HHNKA.  Marland Kitchen. CVA (cerebral infarction)     a. Per [place] records, "possible CVA" 09/2012 but MRI reportedly negative.  . Abnormal Doppler ultrasound of carotid artery      a. Per [place] records: <50% LICA.  Marland Kitchen. Renal insufficiency    Past Surgical History  Procedure Laterality Date  . Tubal ligation    . Abdominal hysterectomy    . Esophagogastroduodenoscopy N/A 02/16/2013    Procedure: ESOPHAGOGASTRODUODENOSCOPY (EGD);  Surgeon: Graylin ShiverSalem F Ganem, MD;  [facility];  Service: Endoscopy;  Laterality: N/A;  . Insertion of dialysis catheter Right 03/03/2013    Procedure: INSERTION OF DIALYSIS CATHETER;  Surgeon: Larina Earthlyodd F Early, MD;  [facility];  Service: Vascular;  Laterality: Right;  Right Internal Jugular Placement  . Av fistula placement Left 03/03/2013    Procedure: ARTERIOVENOUS (AV) FISTULA CREATION, Brachial/Cephalic;  Surgeon: Larina Earthlyodd F Early, MD;  [facility];  Service: Vascular;  Laterality: Left;   Family History  Problem Relation Age of Onset  . Diabetes Mellitus II Sister   . Diabetes Mellitus II Brother   . CAD Brother    History  Substance Use Topics  . Smoking status: Former Games developermoker  . Smokeless tobacco: Current User    Types: Snuff, Chew  . Alcohol Use: No   OB History   Grav Para Term Preterm Abortions TAB SAB Ect Mult Living                 Review of Systems  Unable to perform ROS: Patient nonverbal      Allergies  Review of patient's  allergies indicates no known allergies.  Home Medications   Prior to Admission medications   Medication Sig Start Date End Date Taking? Authorizing Provider  acetaminophen (TYLENOL) 500 MG tablet Take 500-1,000 mg by mouth every 6 (six) hours as needed for moderate pain.    Historical Provider, MD  albuterol (PROVENTIL HFA;VENTOLIN HFA) 108 (90 BASE) MCG/ACT inhaler Inhale 2 puffs into the lungs every 6 (six) hours as needed for wheezing or shortness of breath.     Historical Provider, MD  amLODipine (NORVASC) 10 MG tablet Take 10 mg by mouth daily.     Historical Provider, MD  aspirin EC 81 MG tablet Take 81 mg by mouth daily.    Historical Provider, MD  calcitRIOL (ROCALTROL) 0.25  MCG capsule Take 0.25 mcg by mouth daily.    Historical Provider, MD  calcium acetate (PHOSLO) 667 MG capsule Take 667 mg by mouth 3 (three) times daily with meals.    Historical Provider, MD  carvedilol (COREG) 25 MG tablet Take 25 mg by mouth 2 (two) times daily with a meal.    Historical Provider, MD  cloNIDine (CATAPRES) 0.1 MG tablet Take 0.1-0.2 mg by mouth 2 (two) times daily. Takes 0.1mg  every morning and 0.2mg  at bedtime    Historical Provider, MD  ferrous sulfate 325 (65 FE) MG tablet Take 325 mg by mouth daily with breakfast.    Historical Provider, MD  furosemide (LASIX) 80 MG tablet Take 80 mg by mouth 2 (two) times daily.    Historical Provider, MD  gabapentin (NEURONTIN) 300 MG capsule Take 300 mg by mouth 2 (two) times daily.    Historical Provider, MD  hydrALAZINE (APRESOLINE) 100 MG tablet Take 100 mg by mouth 3 (three) times daily.    Historical Provider, MD  HYDROcodone-acetaminophen (NORCO/VICODIN) 5-325 MG per tablet Take 1 tablet by mouth every 6 (six) hours as needed for moderate pain.    Historical Provider, MD  insulin glargine (LANTUS) 100 UNIT/ML injection Inject 10 Units into the skin at bedtime.    Historical Provider, MD  insulin lispro (HUMALOG) 100 UNIT/ML injection Inject 3-10 Units into the skin 3 (three) times daily before meals. Sliding scale.  CBG 200: 3 UNITS, 250 5 UNITS, 300 7 UNITS, 350 9-10 UNITS    Historical Provider, MD  isosorbide mononitrate (IMDUR) 120 MG 24 hr tablet Take 120 mg by mouth daily.    Historical Provider, MD  metolazone (ZAROXOLYN) 5 MG tablet Take 5 mg by mouth 2 (two) times daily.    Historical Provider, MD  nitroGLYCERIN (NITROSTAT) 0.4 MG SL tablet Place 0.4 mg under the tongue every 5 (five) minutes as needed for chest pain.     Historical Provider, MD  pantoprazole (PROTONIX) 40 MG tablet Take 1 tablet (40 mg total) by mouth 2 (two) times daily. 06/05/13   Windell Hummingbird, MD  penicillin v potassium (VEETID) 500 MG tablet Take 500 mg  by mouth 4 (four) times daily. For 10 days 01/03/14   Historical Provider, MD  polyethylene glycol (MIRALAX / GLYCOLAX) packet Take 17 g by mouth daily as needed (for constipation). 03/05/13   Osvaldo Shipper, MD  promethazine (PHENERGAN) 25 MG tablet Take 25 mg by mouth every 6 (six) hours as needed for nausea or vomiting.    Historical Provider, MD  traMADol (ULTRAM) 50 MG tablet Take 50 mg by mouth 3 (three) times daily as needed for pain.    Historical Provider, MD   BP 159/62  Pulse 74  Temp(Src) 97.6  F (36.4 C) (Oral)  Resp 16  Ht 4\' 11"  (1.499 m)  Wt 134 lb 11.2 oz (61.1 kg)  BMI 27.19 kg/m2  SpO2 100% Physical Exam  Nursing note and vitals reviewed. Constitutional: She appears well-developed and well-nourished. No distress.  HENT:  Head: Normocephalic and atraumatic.  Eyes: EOM are normal. Pupils are equal, round, and reactive to light.  Neck: Normal range of motion. Neck supple.  No meningismus  Cardiovascular: Normal rate and regular rhythm.   Pulmonary/Chest: Effort normal and breath sounds normal. No respiratory distress. She has no wheezes. She has no rales.  Abdominal: Soft. Bowel sounds are normal. She exhibits no distension and no mass. There is no tenderness. There is no rebound and no guarding.  Musculoskeletal: Normal range of motion. She exhibits no edema and no tenderness.  Neurological: She is alert.  Patient is awake but is not following commands. She is nonverbal at this time.  Skin: Skin is warm. No rash noted. She is diaphoretic. No erythema.    ED Course  Procedures (including critical care time) Labs Review Labs Reviewed  CBC WITH DIFFERENTIAL - Abnormal; Notable for the following:    RBC 3.76 (*)    Hemoglobin 11.7 (*)    RDW 17.9 (*)    Monocytes Relative 17 (*)    All other components within normal limits  COMPREHENSIVE METABOLIC PANEL - Abnormal; Notable for the following:    Potassium 3.0 (*)    Glucose, Bld 66 (*)    BUN 35 (*)     Creatinine, Ser 3.68 (*)    Albumin 3.0 (*)    Alkaline Phosphatase 129 (*)    GFR calc non Af Amer 13 (*)    GFR calc Af Amer 15 (*)    Anion gap 16 (*)    All other components within normal limits  URINALYSIS, ROUTINE W REFLEX MICROSCOPIC - Abnormal; Notable for the following:    Color, Urine AMBER (*)    APPearance CLOUDY (*)    Glucose, UA >1000 (*)    Bilirubin Urine SMALL (*)    Ketones, ur 15 (*)    Protein, ur 100 (*)    Leukocytes, UA MODERATE (*)    All other components within normal limits  SALICYLATE LEVEL - Abnormal; Notable for the following:    Salicylate Lvl <2.0 (*)    All other components within normal limits  URINE MICROSCOPIC-ADD ON - Abnormal; Notable for the following:    Bacteria, UA FEW (*)    All other components within normal limits  GLUCOSE, CAPILLARY - Abnormal; Notable for the following:    Glucose-Capillary 118 (*)    All other components within normal limits  GLUCOSE, CAPILLARY - Abnormal; Notable for the following:    Glucose-Capillary 157 (*)    All other components within normal limits  GLUCOSE, CAPILLARY - Abnormal; Notable for the following:    Glucose-Capillary 185 (*)    All other components within normal limits  CBG MONITORING, ED - Abnormal; Notable for the following:    Glucose-Capillary 38 (*)    All other components within normal limits  CBG MONITORING, ED - Abnormal; Notable for the following:    Glucose-Capillary 66 (*)    All other components within normal limits  TROPONIN I  ETHANOL  ACETAMINOPHEN LEVEL  URINE RAPID DRUG SCREEN (HOSP PERFORMED)  BASIC METABOLIC PANEL  CBC  CBG MONITORING, ED  CBG MONITORING, ED  CBG MONITORING, ED    Imaging Review Ct Head Wo  Contrast  02/06/2014   CLINICAL DATA:  Seizures. Temperature of 95 degrees. Altered mental status. Dialysis patient.  EXAM: CT HEAD WITHOUT CONTRAST  TECHNIQUE: Contiguous axial images were obtained from the base of the skull through the vertex without intravenous  contrast.  COMPARISON:  12/21/2013  FINDINGS: Ventricles and sulci appear symmetrical. No mass effect or midline shift. No abnormal extra-axial fluid collections. Gray-white matter junctions are distinct. Basal cisterns are not effaced. No evidence of acute intracranial hemorrhage. No depressed skull fractures. Visualized paranasal sinuses and mastoid air cells are not opacified.  IMPRESSION: No acute intracranial abnormalities.   Electronically Signed   By: Burman Nieves M.D.   On: 02/06/2014 23:56     EKG Interpretation None      MDM   Final diagnoses:  Hypoglycemia  Seizure-like activity  UTI (lower urinary tract infection)   Patient with improved mental status. She's following commands and is alert. Still nonverbal. Placed on D10 drip with stabilization of blood sugar. Discussed with [facility] and will admit to observation bed.     Loren Racer, MD 02/07/14 [phone]

## 2014-02-07 DIAGNOSIS — I5032 Chronic diastolic (congestive) heart failure: Secondary | ICD-10-CM | POA: Diagnosis present

## 2014-02-07 DIAGNOSIS — J449 Chronic obstructive pulmonary disease, unspecified: Secondary | ICD-10-CM | POA: Diagnosis present

## 2014-02-07 DIAGNOSIS — I10 Essential (primary) hypertension: Secondary | ICD-10-CM

## 2014-02-07 DIAGNOSIS — G4733 Obstructive sleep apnea (adult) (pediatric): Secondary | ICD-10-CM | POA: Diagnosis present

## 2014-02-07 DIAGNOSIS — I272 Other secondary pulmonary hypertension: Secondary | ICD-10-CM | POA: Diagnosis present

## 2014-02-07 DIAGNOSIS — IMO0001 Reserved for inherently not codable concepts without codable children: Secondary | ICD-10-CM

## 2014-02-07 DIAGNOSIS — E871 Hypo-osmolality and hyponatremia: Secondary | ICD-10-CM | POA: Diagnosis present

## 2014-02-07 DIAGNOSIS — Z992 Dependence on renal dialysis: Secondary | ICD-10-CM | POA: Diagnosis not present

## 2014-02-07 DIAGNOSIS — E11649 Type 2 diabetes mellitus with hypoglycemia without coma: Secondary | ICD-10-CM | POA: Diagnosis present

## 2014-02-07 DIAGNOSIS — N390 Urinary tract infection, site not specified: Secondary | ICD-10-CM | POA: Diagnosis present

## 2014-02-07 DIAGNOSIS — E162 Hypoglycemia, unspecified: Secondary | ICD-10-CM | POA: Diagnosis present

## 2014-02-07 DIAGNOSIS — F17220 Nicotine dependence, chewing tobacco, uncomplicated: Secondary | ICD-10-CM | POA: Diagnosis present

## 2014-02-07 DIAGNOSIS — N186 End stage renal disease: Secondary | ICD-10-CM | POA: Diagnosis present

## 2014-02-07 DIAGNOSIS — E1165 Type 2 diabetes mellitus with hyperglycemia: Secondary | ICD-10-CM | POA: Diagnosis not present

## 2014-02-07 DIAGNOSIS — E875 Hyperkalemia: Secondary | ICD-10-CM | POA: Diagnosis not present

## 2014-02-07 DIAGNOSIS — Z833 Family history of diabetes mellitus: Secondary | ICD-10-CM | POA: Diagnosis not present

## 2014-02-07 DIAGNOSIS — T383X5A Adverse effect of insulin and oral hypoglycemic [antidiabetic] drugs, initial encounter: Secondary | ICD-10-CM | POA: Diagnosis present

## 2014-02-07 DIAGNOSIS — D631 Anemia in chronic kidney disease: Secondary | ICD-10-CM | POA: Diagnosis present

## 2014-02-07 DIAGNOSIS — R569 Unspecified convulsions: Secondary | ICD-10-CM

## 2014-02-07 DIAGNOSIS — Z794 Long term (current) use of insulin: Secondary | ICD-10-CM | POA: Diagnosis not present

## 2014-02-07 DIAGNOSIS — I252 Old myocardial infarction: Secondary | ICD-10-CM | POA: Diagnosis not present

## 2014-02-07 DIAGNOSIS — I2510 Atherosclerotic heart disease of native coronary artery without angina pectoris: Secondary | ICD-10-CM | POA: Diagnosis present

## 2014-02-07 DIAGNOSIS — I120 Hypertensive chronic kidney disease with stage 5 chronic kidney disease or end stage renal disease: Secondary | ICD-10-CM | POA: Diagnosis present

## 2014-02-07 DIAGNOSIS — E876 Hypokalemia: Secondary | ICD-10-CM | POA: Diagnosis present

## 2014-02-07 DIAGNOSIS — Z8673 Personal history of transient ischemic attack (TIA), and cerebral infarction without residual deficits: Secondary | ICD-10-CM | POA: Diagnosis not present

## 2014-02-07 DIAGNOSIS — Z6827 Body mass index (BMI) 27.0-27.9, adult: Secondary | ICD-10-CM | POA: Diagnosis not present

## 2014-02-07 LAB — BASIC METABOLIC PANEL
ANION GAP: 15 (ref 5–15)
BUN: 37 mg/dL — ABNORMAL HIGH (ref 6–23)
CALCIUM: 9.1 mg/dL (ref 8.4–10.5)
CO2: 25 mEq/L (ref 19–32)
CREATININE: 3.71 mg/dL — AB (ref 0.50–1.10)
Chloride: 94 mEq/L — ABNORMAL LOW (ref 96–112)
GFR calc non Af Amer: 13 mL/min — ABNORMAL LOW (ref >90)
GFR, EST AFRICAN AMERICAN: 15 mL/min — AB (ref >90)
Glucose, Bld: 268 mg/dL — ABNORMAL HIGH (ref 70–99)
Potassium: 5.4 mEq/L — ABNORMAL HIGH (ref 3.7–5.3)
Sodium: 134 mEq/L — ABNORMAL LOW (ref 137–147)

## 2014-02-07 LAB — COMPREHENSIVE METABOLIC PANEL
ALT: 16 U/L (ref 0–35)
AST: 26 U/L (ref 0–37)
Albumin: 3 g/dL — ABNORMAL LOW (ref 3.5–5.2)
Alkaline Phosphatase: 129 U/L — ABNORMAL HIGH (ref 39–117)
Anion gap: 16 — ABNORMAL HIGH (ref 5–15)
BUN: 35 mg/dL — ABNORMAL HIGH (ref 6–23)
CALCIUM: 9 mg/dL (ref 8.4–10.5)
CHLORIDE: 96 meq/L (ref 96–112)
CO2: 26 meq/L (ref 19–32)
CREATININE: 3.68 mg/dL — AB (ref 0.50–1.10)
GFR calc Af Amer: 15 mL/min — ABNORMAL LOW (ref >90)
GFR, EST NON AFRICAN AMERICAN: 13 mL/min — AB (ref >90)
Glucose, Bld: 66 mg/dL — ABNORMAL LOW (ref 70–99)
Potassium: 3 mEq/L — ABNORMAL LOW (ref 3.7–5.3)
SODIUM: 138 meq/L (ref 137–147)
Total Bilirubin: 0.3 mg/dL (ref 0.3–1.2)
Total Protein: 7 g/dL (ref 6.0–8.3)

## 2014-02-07 LAB — CBC WITH DIFFERENTIAL/PLATELET
Basophils Absolute: 0 10*3/uL (ref 0.0–0.1)
Basophils Relative: 0 % (ref 0–1)
Eosinophils Absolute: 0.1 10*3/uL (ref 0.0–0.7)
Eosinophils Relative: 1 % (ref 0–5)
HCT: 36.4 % (ref 36.0–46.0)
Hemoglobin: 11.7 g/dL — ABNORMAL LOW (ref 12.0–15.0)
LYMPHS PCT: 25 % (ref 12–46)
Lymphs Abs: 1.4 10*3/uL (ref 0.7–4.0)
MCH: 31.1 pg (ref 26.0–34.0)
MCHC: 32.1 g/dL (ref 30.0–36.0)
MCV: 96.8 fL (ref 78.0–100.0)
Monocytes Absolute: 1 10*3/uL (ref 0.1–1.0)
Monocytes Relative: 17 % — ABNORMAL HIGH (ref 3–12)
NEUTROS ABS: 3.3 10*3/uL (ref 1.7–7.7)
Neutrophils Relative %: 57 % (ref 43–77)
PLATELETS: 152 10*3/uL (ref 150–400)
RBC: 3.76 MIL/uL — ABNORMAL LOW (ref 3.87–5.11)
RDW: 17.9 % — ABNORMAL HIGH (ref 11.5–15.5)
WBC: 5.8 10*3/uL (ref 4.0–10.5)

## 2014-02-07 LAB — RAPID URINE DRUG SCREEN, HOSP PERFORMED
AMPHETAMINES: NOT DETECTED
BENZODIAZEPINES: NOT DETECTED
Barbiturates: NOT DETECTED
COCAINE: NOT DETECTED
OPIATES: NOT DETECTED
Tetrahydrocannabinol: NOT DETECTED

## 2014-02-07 LAB — CBC
HCT: 36.8 % (ref 36.0–46.0)
Hemoglobin: 12.1 g/dL (ref 12.0–15.0)
MCH: 30.9 pg (ref 26.0–34.0)
MCHC: 32.9 g/dL (ref 30.0–36.0)
MCV: 94.1 fL (ref 78.0–100.0)
Platelets: 161 10*3/uL (ref 150–400)
RBC: 3.91 MIL/uL (ref 3.87–5.11)
RDW: 18 % — ABNORMAL HIGH (ref 11.5–15.5)
WBC: 6.4 10*3/uL (ref 4.0–10.5)

## 2014-02-07 LAB — GLUCOSE, CAPILLARY
GLUCOSE-CAPILLARY: 118 mg/dL — AB (ref 70–99)
GLUCOSE-CAPILLARY: 157 mg/dL — AB (ref 70–99)
GLUCOSE-CAPILLARY: 276 mg/dL — AB (ref 70–99)
GLUCOSE-CAPILLARY: 297 mg/dL — AB (ref 70–99)
GLUCOSE-CAPILLARY: 349 mg/dL — AB (ref 70–99)
Glucose-Capillary: 185 mg/dL — ABNORMAL HIGH (ref 70–99)
Glucose-Capillary: 241 mg/dL — ABNORMAL HIGH (ref 70–99)
Glucose-Capillary: 243 mg/dL — ABNORMAL HIGH (ref 70–99)
Glucose-Capillary: 252 mg/dL — ABNORMAL HIGH (ref 70–99)
Glucose-Capillary: 284 mg/dL — ABNORMAL HIGH (ref 70–99)
Glucose-Capillary: 257 mg/dL — ABNORMAL HIGH (ref 70–99)
Glucose-Capillary: 216 mg/dL — ABNORMAL HIGH (ref 70–99)

## 2014-02-07 LAB — SALICYLATE LEVEL: Salicylate Lvl: <2 mg/dL — ABNORMAL LOW (ref 2.8–20.0)

## 2014-02-07 LAB — CBG MONITORING, ED
GLUCOSE-CAPILLARY: 98 mg/dL (ref 70–99)
Glucose-Capillary: 66 mg/dL — ABNORMAL LOW (ref 70–99)
Glucose-Capillary: 74 mg/dL (ref 70–99)

## 2014-02-07 LAB — ETHANOL: Alcohol, Ethyl (B): <11 mg/dL (ref 0–11)

## 2014-02-07 LAB — ACETAMINOPHEN LEVEL: Acetaminophen (Tylenol), Serum: <15 ug/mL (ref 10–30)

## 2014-02-07 LAB — TROPONIN I

## 2014-02-07 MED ORDER — ONDANSETRON HCL 4 MG/2ML IJ SOLN
4.0000 mg | Freq: Once | INTRAMUSCULAR | Status: AC
  Administered 2014-02-07: 4 mg via INTRAVENOUS
  Filled 2014-02-07: qty 2

## 2014-02-07 MED ORDER — DEXTROSE 5 % IV SOLN
1.0000 g | Freq: Once | INTRAVENOUS | Status: AC
  Administered 2014-02-07: 1 g via INTRAVENOUS
  Filled 2014-02-07: qty 10

## 2014-02-07 MED ORDER — INFLUENZA VAC SPLIT QUAD 0.5 ML IM SUSY
0.5000 mL | PREFILLED_SYRINGE | INTRAMUSCULAR | Status: DC
  Filled 2014-02-07: qty 0.5

## 2014-02-07 MED ORDER — ACETAMINOPHEN 325 MG PO TABS: 650.0000 mg | ORAL_TABLET | Freq: Four times a day (QID) | ORAL | Status: DC | PRN

## 2014-02-07 MED ORDER — DEXTROSE 10 % IV SOLN
INTRAVENOUS | Status: DC
  Administered 2014-02-07: via INTRAVENOUS

## 2014-02-07 MED ORDER — DEXTROSE 50 % IV SOLN: 50.0000 mL | Freq: Once | INTRAVENOUS | Status: AC | PRN

## 2014-02-07 MED ORDER — SODIUM CHLORIDE 0.9 % IJ SOLN
3.0000 mL | Freq: Two times a day (BID) | INTRAMUSCULAR | Status: DC
Start: 2014-02-07 — End: 2014-02-09
  Administered 2014-02-07 – 2014-02-08 (×3): 3 mL via INTRAVENOUS

## 2014-02-07 MED ORDER — ACETAMINOPHEN 650 MG RE SUPP: 650.0000 mg | Freq: Four times a day (QID) | RECTAL | Status: DC | PRN

## 2014-02-07 MED ORDER — HYDROMORPHONE HCL 1 MG/ML IJ SOLN: 0.5000 mg | INTRAMUSCULAR | Status: DC | PRN

## 2014-02-07 MED ORDER — ENOXAPARIN SODIUM 30 MG/0.3ML ~~LOC~~ SOLN
30.0000 mg | SUBCUTANEOUS | Status: DC
  Administered 2014-02-07 – 2014-02-08 (×2): 30 mg via SUBCUTANEOUS
  Filled 2014-02-07 (×3): qty 0.3

## 2014-02-07 MED ORDER — OXYCODONE HCL 5 MG PO TABS: 5.0000 mg | ORAL_TABLET | ORAL | Status: DC | PRN

## 2014-02-07 MED ORDER — PANTOPRAZOLE SODIUM 40 MG IV SOLR
40.0000 mg | INTRAVENOUS | Status: DC
  Administered 2014-02-07 – 2014-02-08 (×2): 40 mg via INTRAVENOUS
  Filled 2014-02-07 (×3): qty 40

## 2014-02-07 MED ORDER — DEXTROSE-NACL 5-0.9 % IV SOLN
INTRAVENOUS | Status: DC
  Administered 2014-02-07: 11:00:00 via INTRAVENOUS

## 2014-02-07 MED ORDER — DEXTROSE 50 % IV SOLN: 25.0000 mL | Freq: Once | INTRAVENOUS | Status: AC | PRN

## 2014-02-07 MED ORDER — DEXTROSE 5 % IV SOLN
1.0000 g | INTRAVENOUS | Status: DC
  Administered 2014-02-07 – 2014-02-08 (×2): 1 g via INTRAVENOUS
  Filled 2014-02-07 (×3): qty 10

## 2014-02-07 MED ORDER — INSULIN ASPART 100 UNIT/ML ~~LOC~~ SOLN
0.0000 [IU] | Freq: Three times a day (TID) | SUBCUTANEOUS | Status: DC
  Administered 2014-02-07: 7 [IU] via SUBCUTANEOUS
  Administered 2014-02-08: 5 [IU] via SUBCUTANEOUS
  Administered 2014-02-08: 9 [IU] via SUBCUTANEOUS
  Administered 2014-02-08: 5 [IU] via SUBCUTANEOUS

## 2014-02-07 MED ORDER — ONDANSETRON HCL 4 MG/2ML IJ SOLN
4.0000 mg | Freq: Four times a day (QID) | INTRAMUSCULAR | Status: DC | PRN
Start: 2014-02-07 — End: 2014-02-09
  Administered 2014-02-07: 4 mg via INTRAVENOUS
  Filled 2014-02-07: qty 2

## 2014-02-07 MED ORDER — POTASSIUM CHLORIDE 10 MEQ/100ML IV SOLN
10.0000 meq | INTRAVENOUS | Status: AC
  Administered 2014-02-07 (×3): 10 meq via INTRAVENOUS
  Filled 2014-02-07 (×3): qty 100

## 2014-02-07 MED ORDER — ONDANSETRON HCL 4 MG PO TABS
4.0000 mg | ORAL_TABLET | Freq: Four times a day (QID) | ORAL | Status: DC | PRN
Start: 2014-02-07 — End: 2014-02-09

## 2014-02-07 MED ORDER — HYDRALAZINE HCL 20 MG/ML IJ SOLN
5.0000 mg | INTRAMUSCULAR | Status: DC | PRN
Start: 2014-02-07 — End: 2014-02-08
  Administered 2014-02-07 – 2014-02-08 (×2): 5 mg via INTRAVENOUS
  Filled 2014-02-07 (×2): qty 1

## 2014-02-07 MED ORDER — KCL IN DEXTROSE-NACL 20-5-0.45 MEQ/L-%-% IV SOLN
INTRAVENOUS | Status: DC
  Administered 2014-02-07: 03:00:00 via INTRAVENOUS
  Filled 2014-02-07 (×2): qty 1000

## 2014-02-07 NOTE — Progress Notes (Signed)
UR completed 

## 2014-02-07 NOTE — Progress Notes (Signed)
Pt arrive to unit in no s/s of distress. Pt alert and oriented to self. Pt reoriented to place and time. Granddaughter at bedside. VS stable. Tele applied. Callbell within reach. Will continue to monitor. Report received from ED nurse prior to pt's arrival. Whiteboard updated.

## 2014-02-07 NOTE — H&P (Signed)
[facility] Admission History and Physical       Madeline [name] WUJ:[id number] DOB: 27-Nov-1955 DOA: 02/06/2014  Referring physician: EDP PCP: [name],[name], MD  Specialists:   Chief Complaint:  Seizure Activity  HPI: Madeline [name] is a 58 y.o. female with a history of ESRD on HD, Uncontrolled DM2, HTN who was brought to the ED after an episode of shaking all over and unresponsiveneness at home which was witness by her family.   The history is provided by her grand-daughter who is at the bedside and she reports that her grandmother took her medications at 9:06 pm and  A few minutes later she had vomiting and vomited up her medications, and then she laid down shortly thereafter  She began to shake and jerk all over.   She had decreased responsiveness and her family called EMS, when EMS arrived and check her blood sugar it was 15.  EMS gave her D50 and her glucose increased to 75 and she was brought tot the ED.       Review of Systems:  Constitutional: No Weight Loss, No Weight Gain, Night Sweats, Fevers, Chills, Dizziness, Fatigue, or Generalized Weakness HEENT: No Headaches, Difficulty Swallowing,Tooth/Dental Problems,Sore Throat,  No Sneezing, Rhinitis, Ear Ache, Nasal Congestion, or Post Nasal Drip,  Cardio-vascular:  No Chest pain, Orthopnea, PND, Edema in Lower Extremities, Anasarca, Dizziness, Palpitations  Resp: No Dyspnea, No DOE, No Productive Cough, No Non-Productive Cough, No Hemoptysis, No Wheezing.    GI: No Heartburn, Indigestion, Abdominal Pain, Nausea, Vomiting, Diarrhea, Hematemesis, Hematochezia, Melena, Change in Bowel Habits,  Loss of Appetite  GU: No Dysuria, Change in Color of Urine, No Urgency or Frequency, No Flank pain.  Musculoskeletal: No Joint Pain or Swelling, No Decreased Range of Motion, No Back Pain.  Neurologic: No Syncope, +Seizure, Muscle Weakness, Paresthesia, Vision Disturbance or Loss, No Diplopia, No Vertigo, No Difficulty Walking,  Skin: No Rash  or Lesions. Psych: No Change in Mood or Affect, No Depression or Anxiety, No Memory loss, No Confusion, or Hallucinations   Past Medical History  Diagnosis Date  . CHF (congestive heart failure)     a. HFpEF with RHF/anasarca/pHTN.  . COPD (chronic obstructive pulmonary disease)   . Coronary artery disease     a. Per [place] records: NSTEMI 01/2012, tx medically given ARF but suspected CAD. b. Stress test 12/16/11 reported w/o ischemia.  . Hypertension   . Diabetes mellitus   . OSA (obstructive sleep apnea)     a. Pt reported used to use CPAP in [place], but [facility]  Marland Kitchen. CKD (chronic kidney disease), stage III     a. Per [place] records: h/o ARF after CTA that ruled out PE.  Marland Kitchen. Pulmonary hypertension     a. RHC 02/28/13: mod pulm HTN with normal PVR suggestive of predominantly pulmonary venous HTN.  . Right heart failure   . Anasarca     a. Per [place] records - due to pulm HTN with R HF.   Marland Kitchen. Aseptic meningitis     a. 09/2012: adm in [place] for metabolic encephalopathy, oliguric tubular necrosis, anemia, HTN, possible CVA, HHNKA.  Marland Kitchen. CVA (cerebral infarction)     a. Per [place] records, "possible CVA" 09/2012 but MRI reportedly negative.  . Abnormal Doppler ultrasound of carotid artery     a. Per  records: <50% LICA.  Marland Kitchen. Renal insufficiency      Past Surgical History  Procedure Laterality Date  . Tubal ligation    .  Abdominal hysterectomy    . Esophagogastroduodenoscopy N/A 02/16/2013    Procedure: ESOPHAGOGASTRODUODENOSCOPY (EGD);  Surgeon: Graylin Shiver, MD;  [facility]: [facility] ENDOSCOPY;  Service: Endoscopy;  Laterality: N/A;  . Insertion of dialysis catheter Right 03/03/2013    Procedure: INSERTION OF DIALYSIS CATHETER;  Surgeon: Larina Earthly, MD;  Location: [facility] OR;  Service: Vascular;  Laterality: Right;  Right Internal Jugular Placement  . Av fistula placement Left 03/03/2013    Procedure: ARTERIOVENOUS (AV) FISTULA CREATION, Brachial/Cephalic;  Surgeon: Larina Earthly, MD;  Location[facility] OR;   Service: Vascular;  Laterality: Left;      Prior to Admission medications   Medication Sig Start Date End Date Taking? Authorizing Provider  acetaminophen (TYLENOL) 500 MG tablet Take 500-1,000 mg by mouth every 6 (six) hours as needed for moderate pain.    Historical Provider, MD  albuterol (PROVENTIL HFA;VENTOLIN HFA) 108 (90 BASE) MCG/ACT inhaler Inhale 2 puffs into the lungs every 6 (six) hours as needed for wheezing or shortness of breath.     Historical Provider, MD  amLODipine (NORVASC) 10 MG tablet Take 10 mg by mouth daily.     Historical Provider, MD  aspirin EC 81 MG tablet Take 81 mg by mouth daily.    Historical Provider, MD  calcitRIOL (ROCALTROL) 0.25 MCG capsule Take 0.25 mcg by mouth daily.    Historical Provider, MD  calcium acetate (PHOSLO) 667 MG capsule Take 667 mg by mouth 3 (three) times daily with meals.    Historical Provider, MD  carvedilol (COREG) 25 MG tablet Take 25 mg by mouth 2 (two) times daily with a meal.    Historical Provider, MD  cloNIDine (CATAPRES) 0.1 MG tablet Take 0.1-0.2 mg by mouth 2 (two) times daily. Takes 0.1mg  every morning and 0.2mg  at bedtime    Historical Provider, MD  ferrous sulfate 325 (65 FE) MG tablet Take 325 mg by mouth daily with breakfast.    Historical Provider, MD  furosemide (LASIX) 80 MG tablet Take 80 mg by mouth 2 (two) times daily.    Historical Provider, MD  gabapentin (NEURONTIN) 300 MG capsule Take 300 mg by mouth 2 (two) times daily.    Historical Provider, MD  hydrALAZINE (APRESOLINE) 100 MG tablet Take 100 mg by mouth 3 (three) times daily.    Historical Provider, MD  HYDROcodone-acetaminophen (NORCO/VICODIN) 5-325 MG per tablet Take 1 tablet by mouth every 6 (six) hours as needed for moderate pain.    Historical Provider, MD  insulin glargine (LANTUS) 100 UNIT/ML injection Inject 10 Units into the skin at bedtime.    Historical Provider, MD  insulin lispro (HUMALOG) 100 UNIT/ML injection Inject 3-10 Units into the skin 3  (three) times daily before meals. Sliding scale.  CBG 200: 3 UNITS, 250 5 UNITS, 300 7 UNITS, 350 9-10 UNITS    Historical Provider, MD  isosorbide mononitrate (IMDUR) 120 MG 24 hr tablet Take 120 mg by mouth daily.    Historical Provider, MD  metolazone (ZAROXOLYN) 5 MG tablet Take 5 mg by mouth 2 (two) times daily.    Historical Provider, MD  nitroGLYCERIN (NITROSTAT) 0.4 MG SL tablet Place 0.4 mg under the tongue every 5 (five) minutes as needed for chest pain.     Historical Provider, MD  pantoprazole (PROTONIX) 40 MG tablet Take 1 tablet (40 mg total) by mouth 2 (two) times daily. 06/05/13   Windell Hummingbird, MD  penicillin v potassium (VEETID) 500 MG tablet Take 500 mg by mouth 4 (four) times  daily. For 10 days 01/03/14   Historical Provider, MD  polyethylene glycol (MIRALAX / GLYCOLAX) packet Take 17 g by mouth daily as needed (for constipation). 03/05/13   Osvaldo Shipper, MD  promethazine (PHENERGAN) 25 MG tablet Take 25 mg by mouth every 6 (six) hours as needed for nausea or vomiting.    Historical Provider, MD  traMADol (ULTRAM) 50 MG tablet Take 50 mg by mouth 3 (three) times daily as needed for pain.    Historical Provider, MD     No Known Allergies   Social History:  reports that she has quit smoking. Her smokeless tobacco use includes Snuff and Chew. She reports that she does not drink alcohol or use illicit drugs.     Family History  Problem Relation Age of Onset  . Diabetes Mellitus II Sister   . Diabetes Mellitus II Brother   . CAD Brother        Physical Exam:  GEN:  Obtunded Well Nourished Well Developed 58 y.o. African American female examined  and in no acute distress; cooperative with exam Filed Vitals:   02/07/14 0100 02/07/14 0130 02/07/14 0145 02/07/14 0151  BP: 114/60 130/62 135/65   Pulse: 63 62 61   Temp:    97.5 F (36.4 C)  TempSrc:    Oral  Resp: 14 12 15    SpO2: 99% 97% 100%    Blood pressure 135/65, pulse 61, temperature 97.5 F (36.4 C),  temperature source Oral, resp. rate 15, SpO2 100.00%. PSYCH: She is alert and oriented x 0;  HEENT: Normocephalic and Atraumatic, Mucous membranes pink; PERRLA; EOM intact; Fundi:  Benign;  No scleral icterus, Nares: Patent, Oropharynx: Clear, Fair Dentition,    Neck:  FROM, No Cervical Lymphadenopathy nor Thyromegaly or Carotid Bruit; No JVD; Breasts:: Not examined CHEST WALL: No tenderness CHEST: Normal respiration, clear to auscultation bilaterally HEART: Regular rate and rhythm; no murmurs rubs or gallops BACK: No kyphosis or scoliosis; No CVA tenderness ABDOMEN: Positive Bowel Sounds, Soft Non-Tender; No Masses, No Organomegaly. Rectal Exam: Not done EXTREMITIES: No Cyanosis, Clubbing, or Edema; No Ulcerations. Genitalia: not examined PULSES: 2+ and symmetric SKIN: Normal hydration no rash or ulceration CNS:  Obtunded,  Able to move all 4 Extremities Vascular: pulses palpable throughout    Labs on Admission:  Basic Metabolic Panel:  Recent Labs Lab 02/07/14  NA 138  K 3.0*  CL 96  CO2 26  GLUCOSE 66*  BUN 35*  CREATININE 3.68*  CALCIUM 9.0   Liver Function Tests:  Recent Labs Lab 02/07/14  AST 26  ALT 16  ALKPHOS 129*  BILITOT 0.3  PROT 7.0  ALBUMIN 3.0*   No results found for this basename: LIPASE, AMYLASE,  in the last 168 hours No results found for this basename: AMMONIA,  in the last 168 hours CBC:  Recent Labs Lab 02/07/14  WBC 5.8  NEUTROABS 3.3  HGB 11.7*  HCT 36.4  MCV 96.8  PLT 152   Cardiac Enzymes:  Recent Labs Lab 02/07/14  TROPONINI <0.30    BNP (last 3 results)  Recent Labs  02/22/13 0709 02/26/13 0546 03/15/13 1530  PROBNP 5090.0* 7568.0* 8133.0*   CBG:  Recent Labs Lab 02/06/14 2258 02/06/14 2325 02/07/14 0003 02/07/14 0047 02/07/14 0135  GLUCAP 38* 93 66* 74 98    Radiological Exams on Admission: Ct Head Wo Contrast  02/06/2014   CLINICAL DATA:  Seizures. Temperature of 95 degrees. Altered mental status.  Dialysis patient.  EXAM: CT HEAD WITHOUT CONTRAST  TECHNIQUE: Contiguous axial images were obtained from the base of the skull through the vertex without intravenous contrast.  COMPARISON:  12/21/2013  FINDINGS: Ventricles and sulci appear symmetrical. No mass effect or midline shift. No abnormal extra-axial fluid collections. Gray-white matter junctions are distinct. Basal cisterns are not effaced. No evidence of acute intracranial hemorrhage. No depressed skull fractures. Visualized paranasal sinuses and mastoid air cells are not opacified.  IMPRESSION: No acute intracranial abnormalities.   Electronically Signed   By: Burman Nieves M.D.   On: 02/06/2014 23:56     EKG: Independently reviewed.    Assessment/Plan:   58 y.o. female with  Principal Problem:   1.   Hypoglycemia- due to Over -Administration of Rx   Hold DM meds   IVFs with D5 1/2 NSS at 50 cc per hr   Monitor Glucose levels q hour x 12     2.   Seizure- due to #1   Seizure Precautions      3.   UTI   IV Rocephin   Urine C+S sent       4.   Diabetes mellitus type II, uncontrolled   SSI coverage PRN   Resume DM meds when Glucose is stablized     5.   HTN (hypertension), malignant   Monitor BPs   IV Hydralazine PRN     6.   Chronic diastolic heart failure   Monitor for Signs and Sxs of Fluid Overload     7.   Anemia of chronic renal failure   Monitor Hemoglobin levels     8.   ESRD on hemodialysis- HAs Dialysis on M, W, Fs   Notify dialysis Team       9.   DVT Prophylaxis   Lovenox  10.   Hypokalemia   K+ Repletion IV    Code Status:    FULL CODE   Family Communication:   Grand-Daughter at Bedside  Disposition Plan:    Observation  Time spent:  74 Minutes  Ron Parker [facility] Pager [phone]   If 7AM -7PM Please Contact the Day Rounding Team MD for [facility]  If 7PM-7AM, Please Contact Night-Floor Coverage  [email or web address] Password [email or web address] 02/07/2014, 1:57 AM

## 2014-02-07 NOTE — Progress Notes (Addendum)
Pt seen and evaluated by my associate earlier this AM by my associate. Please refer to the H and P for details for details regarding assessment and plan.  Will reassess next am.  Madeline [name]  Given hyperkalemia and hyponatremia will change IVF to D 5 normal saline.

## 2014-02-07 NOTE — Progress Notes (Signed)
Inpatient Diabetes Program Recommendations  AACE/ADA: New Consensus Statement on Inpatient Glycemic Control (2013)  Target Ranges:  Prepandial:   less than 140 mg/dL      Peak postprandial:   less than 180 mg/dL (1-2 hours)      Critically ill patients:  140 - 180 mg/dL   Reason for Visit: Admitted with Hypoglycemia  Outpatient Diabetes medications: Lantus 10 units at HS and humalog 3-6 units tidwc Current orders for Inpatient glycemic control: No insulin thus far.  Noted Dextrose to be added to IV fluids- Glucose now up to 257 mg/dL.  Pt will need some correction insulin, recommend sensitive correction tidwc.  Thank you, Lenor CoffinAnn Latima Hamza, RN, CNS, Diabetes Coordinator [phone])

## 2014-02-07 NOTE — Progress Notes (Signed)
On call physcian notified of pt's BP 171/69. Pt in no s/s of distress at this time.

## 2014-02-08 DIAGNOSIS — E162 Hypoglycemia, unspecified: Secondary | ICD-10-CM

## 2014-02-08 LAB — GLUCOSE, CAPILLARY
GLUCOSE-CAPILLARY: 277 mg/dL — AB (ref 70–99)
GLUCOSE-CAPILLARY: 384 mg/dL — AB (ref 70–99)
GLUCOSE-CAPILLARY: 213 mg/dL — AB (ref 70–99)
Glucose-Capillary: 289 mg/dL — ABNORMAL HIGH (ref 70–99)

## 2014-02-08 LAB — POTASSIUM: POTASSIUM: 4.3 meq/L (ref 3.7–5.3)

## 2014-02-08 MED ORDER — FUROSEMIDE 80 MG PO TABS
80.0000 mg | ORAL_TABLET | Freq: Two times a day (BID) | ORAL | Status: DC
  Filled 2014-02-08 (×2): qty 1

## 2014-02-08 MED ORDER — SODIUM POLYSTYRENE SULFONATE 15 GM/60ML PO SUSP
30.0000 g | Freq: Once | ORAL | Status: DC
  Filled 2014-02-08: qty 120

## 2014-02-08 MED ORDER — HYDRALAZINE HCL 50 MG PO TABS
50.0000 mg | ORAL_TABLET | Freq: Three times a day (TID) | ORAL | Status: DC
  Administered 2014-02-08 – 2014-02-09 (×3): 50 mg via ORAL
  Filled 2014-02-08 (×5): qty 1

## 2014-02-08 MED ORDER — ISOSORBIDE MONONITRATE ER 60 MG PO TB24
120.0000 mg | ORAL_TABLET | Freq: Every day | ORAL | Status: DC
  Administered 2014-02-08 – 2014-02-09 (×2): 120 mg via ORAL
  Filled 2014-02-08 (×2): qty 2

## 2014-02-08 MED ORDER — GABAPENTIN 300 MG PO CAPS
300.0000 mg | ORAL_CAPSULE | Freq: Two times a day (BID) | ORAL | Status: DC
Start: 2014-02-08 — End: 2014-02-09
  Administered 2014-02-08 – 2014-02-09 (×3): 300 mg via ORAL
  Filled 2014-02-08 (×4): qty 1

## 2014-02-08 MED ORDER — ASPIRIN EC 81 MG PO TBEC
81.0000 mg | DELAYED_RELEASE_TABLET | Freq: Every day | ORAL | Status: DC
  Administered 2014-02-08 – 2014-02-09 (×2): 81 mg via ORAL
  Filled 2014-02-08 (×2): qty 1

## 2014-02-08 MED ORDER — HYDRALAZINE HCL 100 MG PO TABS: 50.0000 mg | ORAL_TABLET | Freq: Three times a day (TID) | ORAL | Status: DC

## 2014-02-08 MED ORDER — RENA-VITE PO TABS
1.0000 | ORAL_TABLET | Freq: Every day | ORAL | Status: DC
  Administered 2014-02-08: 1 via ORAL
  Filled 2014-02-08 (×2): qty 1

## 2014-02-08 MED ORDER — CALCITRIOL 0.25 MCG PO CAPS
0.2500 ug | ORAL_CAPSULE | Freq: Every day | ORAL | Status: DC
  Administered 2014-02-08: 0.25 ug via ORAL
  Filled 2014-02-08 (×2): qty 1

## 2014-02-08 MED ORDER — ISOSORBIDE MONONITRATE ER 60 MG PO TB24
120.0000 mg | ORAL_TABLET | Freq: Every day | ORAL | Status: DC
Start: 2014-02-08 — End: 2014-02-08

## 2014-02-08 MED ORDER — SODIUM CHLORIDE 0.9 % IV SOLN
62.5000 mg | INTRAVENOUS | Status: DC
  Administered 2014-02-09: 62.5 mg via INTRAVENOUS
  Filled 2014-02-08 (×2): qty 5

## 2014-02-08 MED ORDER — HYDRALAZINE HCL 20 MG/ML IJ SOLN
10.0000 mg | INTRAMUSCULAR | Status: DC | PRN
  Administered 2014-02-08: 10 mg via INTRAVENOUS
  Filled 2014-02-08: qty 1

## 2014-02-08 MED ORDER — NITROGLYCERIN 0.4 MG SL SUBL: 0.4000 mg | SUBLINGUAL_TABLET | SUBLINGUAL | Status: DC | PRN

## 2014-02-08 MED ORDER — INSULIN GLARGINE 100 UNIT/ML ~~LOC~~ SOLN
5.0000 [IU] | Freq: Every day | SUBCUTANEOUS | Status: DC
  Administered 2014-02-08: 5 [IU] via SUBCUTANEOUS
  Filled 2014-02-08 (×2): qty 0.05

## 2014-02-08 MED ORDER — AMLODIPINE BESYLATE 10 MG PO TABS
10.0000 mg | ORAL_TABLET | Freq: Every day | ORAL | Status: DC
  Administered 2014-02-08 – 2014-02-09 (×2): 10 mg via ORAL
  Filled 2014-02-08 (×2): qty 1

## 2014-02-08 MED ORDER — PANTOPRAZOLE SODIUM 40 MG PO TBEC
40.0000 mg | DELAYED_RELEASE_TABLET | Freq: Two times a day (BID) | ORAL | Status: DC
  Administered 2014-02-08 – 2014-02-09 (×2): 40 mg via ORAL
  Filled 2014-02-08 (×2): qty 1

## 2014-02-08 MED ORDER — CARVEDILOL 25 MG PO TABS
25.0000 mg | ORAL_TABLET | Freq: Two times a day (BID) | ORAL | Status: DC
  Administered 2014-02-08 – 2014-02-09 (×2): 25 mg via ORAL
  Filled 2014-02-08 (×4): qty 1

## 2014-02-08 MED ORDER — CALCIUM ACETATE 667 MG PO CAPS
667.0000 mg | ORAL_CAPSULE | Freq: Three times a day (TID) | ORAL | Status: DC
  Administered 2014-02-08: 667 mg via ORAL
  Filled 2014-02-08 (×5): qty 1

## 2014-02-08 NOTE — Consult Note (Addendum)
[facility] Renal Consultation Note  Indication for Consultation:  Management of ESRD/hemodialysis; anemia, hypertension/volume and secondary hyperparathyroidism  HPI: Madeline [name] is a 58 y.o. female admitted last pm with Hypoglycemic event/ Seizure type activity thought secondary to Low Bs and UTI.  Patient not able to recall events yesterday evening , but granddaughter gives history of pt vomiting,earlier in evening,then episode of "body shaking all over and eyes rolling side ways in head". Ems called and reported BS 15 at home , given D50 ,BS stabilized to 75 and brought to ED" Ct head = nad, urine cw UTI. No further seizures  And pt and family report no history of seizures. She recently started HD "[facility], missed hd yesterday "did not feel well". No problems Monday  at HD using AVF.                                         Now in room with multiple familymembers and has no cos' ,not sob, "can wait tll am for hemodialysis" Denies fevers, sweats, chills,dysuria, chest pain, abdominal pain and headache.   Past Medical History  Diagnosis Date  . CHF (congestive heart failure)     a. HFpEF with RHF/anasarca/pHTN.  . COPD (chronic obstructive pulmonary disease)   . Coronary artery disease     a. Per [place] records: NSTEMI 01/2012, tx medically given ARF but suspected CAD. b. Stress test 12/16/11 reported w/o ischemia.  . Hypertension   . Diabetes mellitus   . OSA (obstructive sleep apnea)     a. Pt reported used to use CPAP in [place], but "ran out" when came to [facility].  Marland Kitchen CKD (chronic kidney disease), stage III     a. Per [place] records: h/o ARF after CTA that ruled out PE.  Marland Kitchen Pulmonary hypertension     a. [place] 02/28/13: mod pulm HTN with normal PVR suggestive of predominantly pulmonary venous HTN.  . Right heart failure   . Anasarca     a. Per [place] records - due to pulm HTN with R HF.   Marland Kitchen Aseptic meningitis     a. 09/2012: adm in [place] for metabolic encephalopathy, oliguric  tubular necrosis, anemia, HTN, possible CVA, HHNKA.  Marland Kitchen CVA (cerebral infarction)     a. Per [place] records, "possible CVA" 09/2012 but MRI reportedly negative.  . Abnormal Doppler ultrasound of carotid artery     a. Per  records: <02% LICA.  Marland Kitchen Renal insufficiency     Past Surgical History  Procedure Laterality Date  . Tubal ligation    . Abdominal hysterectomy    . Esophagogastroduodenoscopy N/A 02/16/2013    Procedure: ESOPHAGOGASTRODUODENOSCOPY (EGD);  Surgeon: Wonda Horner, MD;  Location: [facility] ENDOSCOPY;  Service: Endoscopy;  Laterality: N/A;  . Insertion of dialysis catheter Right 03/03/2013    Procedure: INSERTION OF DIALYSIS CATHETER;  Surgeon: Rosetta Posner, MD;  Location: [place];  Service: Vascular;  Laterality: Right;  Right Internal Jugular Placement  . Av fistula placement Left 03/03/2013    Procedure: ARTERIOVENOUS (AV) FISTULA CREATION, Brachial/Cephalic;  Surgeon: Rosetta Posner, MD;  Location: [facility] OR;  Service: Vascular;  Laterality: Left;      Family History  Problem Relation Age of Onset  . Diabetes Mellitus II Sister   . Diabetes Mellitus II Brother   . CAD Brother       reports that  she has quit smoking. Her smokeless tobacco use includes Snuff and Chew. She reports that she does not drink alcohol or use illicit drugs.  No Known Allergies  Prior to Admission medications   Medication Sig Start Date End Date Taking? Authorizing Provider  acetaminophen (TYLENOL) 500 MG tablet Take 500-1,000 mg by mouth every 6 (six) hours as needed for moderate pain.   Yes Historical Provider, MD  albuterol (PROVENTIL HFA;VENTOLIN HFA) 108 (90 BASE) MCG/ACT inhaler Inhale 2 puffs into the lungs every 6 (six) hours as needed for wheezing or shortness of breath.    Yes Historical Provider, MD  amLODipine (NORVASC) 10 MG tablet Take 10 mg by mouth daily.    Yes Historical Provider, MD  aspirin EC 81 MG tablet Take 81 mg by mouth daily.   Yes Historical Provider, MD  calcitRIOL (ROCALTROL) 0.25  MCG capsule Take 0.25 mcg by mouth daily.   Yes Historical Provider, MD  calcium acetate (PHOSLO) 667 MG capsule Take 667 mg by mouth 3 (three) times daily with meals.   Yes Historical Provider, MD  carvedilol (COREG) 25 MG tablet Take 25 mg by mouth 2 (two) times daily with a meal.   Yes Historical Provider, MD  ferrous sulfate 325 (65 FE) MG tablet Take 325 mg by mouth every Monday, Wednesday, and Friday with hemodialysis.    Yes Historical Provider, MD  furosemide (LASIX) 80 MG tablet Take 80 mg by mouth 2 (two) times daily.   Yes Historical Provider, MD  gabapentin (NEURONTIN) 300 MG capsule Take 300 mg by mouth 2 (two) times daily.   Yes Historical Provider, MD  hydrALAZINE (APRESOLINE) 100 MG tablet Take 50 mg by mouth 3 (three) times daily.    Yes Historical Provider, MD  HYDROcodone-acetaminophen (NORCO/VICODIN) 5-325 MG per tablet Take 1 tablet by mouth every 6 (six) hours as needed for moderate pain.   Yes Historical Provider, MD  insulin glargine (LANTUS) 100 UNIT/ML injection Inject 10 Units into the skin at bedtime.   Yes Historical Provider, MD  insulin lispro (HUMALOG) 100 UNIT/ML injection Inject 3-10 Units into the skin 3 (three) times daily before meals. Sliding scale.  CBG 200: 3 UNITS, 250 5 UNITS, 300 7 UNITS, 350 9-10 UNITS   Yes Historical Provider, MD  isosorbide mononitrate (IMDUR) 120 MG 24 hr tablet Take 120 mg by mouth daily.   Yes Historical Provider, MD  metolazone (ZAROXOLYN) 5 MG tablet Take 5 mg by mouth 2 (two) times daily.   Yes Historical Provider, MD  nitroGLYCERIN (NITROSTAT) 0.4 MG SL tablet Place 0.4 mg under the tongue every 5 (five) minutes as needed for chest pain.    Yes Historical Provider, MD  pantoprazole (PROTONIX) 40 MG tablet Take 1 tablet (40 mg total) by mouth 2 (two) times daily. 06/05/13  Yes Rebecca Eaton, MD  promethazine (PHENERGAN) 25 MG tablet Take 25 mg by mouth 2 (two) times daily as needed for nausea or vomiting.    Yes Historical  Provider, MD  traMADol (ULTRAM) 50 MG tablet Take 50 mg by mouth every 12 (twelve) hours as needed for severe pain.    Yes Historical Provider, MD     Anti-infectives   Start     Dose/Rate Route Frequency Ordered Stop   02/07/14 2200  cefTRIAXone (ROCEPHIN) 1 g in dextrose 5 % 50 mL IVPB     1 g 100 mL/hr over 30 Minutes Intravenous Every 24 hours 02/07/14 0224     02/07/14 0100  cefTRIAXone (ROCEPHIN) 1  g in dextrose 5 % 50 mL IVPB     1 g 100 mL/hr over 30 Minutes Intravenous  Once 02/07/14 0052 02/07/14 0134      Results for orders placed during the hospital encounter of 02/06/14 (from the past 48 hour(s))  CBG MONITORING, ED     Status: Abnormal   Collection Time    02/06/14 10:58 PM      Result Value Ref Range   Glucose-Capillary 38 (*) 70 - 99 mg/dL   Comment 1 Notify RN    URINE RAPID DRUG SCREEN (HOSP PERFORMED)     Status: None   Collection Time    02/06/14 11:20 PM      Result Value Ref Range   Opiates NONE DETECTED  NONE DETECTED   Cocaine NONE DETECTED  NONE DETECTED   Benzodiazepines NONE DETECTED  NONE DETECTED   Amphetamines NONE DETECTED  NONE DETECTED   Tetrahydrocannabinol NONE DETECTED  NONE DETECTED   Barbiturates NONE DETECTED  NONE DETECTED   Comment:            DRUG SCREEN FOR MEDICAL PURPOSES     ONLY.  IF CONFIRMATION IS NEEDED     FOR ANY PURPOSE, NOTIFY LAB     WITHIN 5 DAYS.                LOWEST DETECTABLE LIMITS     FOR URINE DRUG SCREEN     Drug Class       Cutoff (ng/mL)     Amphetamine      1000     Barbiturate      200     Benzodiazepine   749     Tricyclics       449     Opiates          300     Cocaine          300     THC              50  URINALYSIS, ROUTINE W REFLEX MICROSCOPIC     Status: Abnormal   Collection Time    02/06/14 11:21 PM      Result Value Ref Range   Color, Urine AMBER (*) YELLOW   Comment: BIOCHEMICALS MAY BE AFFECTED BY COLOR   APPearance CLOUDY (*) CLEAR   Specific Gravity, Urine 1.023  1.005 - 1.030   pH  5.0  5.0 - 8.0   Glucose, UA >1000 (*) NEGATIVE mg/dL   Hgb urine dipstick NEGATIVE  NEGATIVE   Bilirubin Urine SMALL (*) NEGATIVE   Ketones, ur 15 (*) NEGATIVE mg/dL   Protein, ur 100 (*) NEGATIVE mg/dL   Urobilinogen, UA 0.2  0.0 - 1.0 mg/dL   Nitrite NEGATIVE  NEGATIVE   Leukocytes, UA MODERATE (*) NEGATIVE  URINE MICROSCOPIC-ADD ON     Status: Abnormal   Collection Time    02/06/14 11:21 PM      Result Value Ref Range   Squamous Epithelial / LPF RARE  RARE   WBC, UA 21-50  <3 WBC/hpf   RBC / HPF 0-2  <3 RBC/hpf   Bacteria, UA FEW (*) RARE   Urine-Other AMORPHOUS URATES/PHOSPHATES    CBG MONITORING, ED     Status: None   Collection Time    02/06/14 11:25 PM      Result Value Ref Range   Glucose-Capillary 93  70 - 99 mg/dL  CBC WITH DIFFERENTIAL     Status: Abnormal  Collection Time    02/07/14 12:00 AM      Result Value Ref Range   WBC 5.8  4.0 - 10.5 K/uL   RBC 3.76 (*) 3.87 - 5.11 MIL/uL   Hemoglobin 11.7 (*) 12.0 - 15.0 g/dL   HCT 36.4  36.0 - 46.0 %   MCV 96.8  78.0 - 100.0 fL   MCH 31.1  26.0 - 34.0 pg   MCHC 32.1  30.0 - 36.0 g/dL   RDW 17.9 (*) 11.5 - 15.5 %   Platelets 152  150 - 400 K/uL   Neutrophils Relative % 57  43 - 77 %   Neutro Abs 3.3  1.7 - 7.7 K/uL   Lymphocytes Relative 25  12 - 46 %   Lymphs Abs 1.4  0.7 - 4.0 K/uL   Monocytes Relative 17 (*) 3 - 12 %   Monocytes Absolute 1.0  0.1 - 1.0 K/uL   Eosinophils Relative 1  0 - 5 %   Eosinophils Absolute 0.1  0.0 - 0.7 K/uL   Basophils Relative 0  0 - 1 %   Basophils Absolute 0.0  0.0 - 0.1 K/uL  COMPREHENSIVE METABOLIC PANEL     Status: Abnormal   Collection Time    02/07/14 12:00 AM      Result Value Ref Range   Sodium 138  137 - 147 mEq/L   Potassium 3.0 (*) 3.7 - 5.3 mEq/L   Chloride 96  96 - 112 mEq/L   CO2 26  19 - 32 mEq/L   Glucose, Bld 66 (*) 70 - 99 mg/dL   BUN 35 (*) 6 - 23 mg/dL   Creatinine, Ser 3.68 (*) 0.50 - 1.10 mg/dL   Calcium 9.0  8.4 - 10.5 mg/dL   Total Protein 7.0  6.0  - 8.3 g/dL   Albumin 3.0 (*) 3.5 - 5.2 g/dL   AST 26  0 - 37 U/L   ALT 16  0 - 35 U/L   Alkaline Phosphatase 129 (*) 39 - 117 U/L   Total Bilirubin 0.3  0.3 - 1.2 mg/dL   GFR calc non Af Amer 13 (*) >90 mL/min   GFR calc Af Amer 15 (*) >90 mL/min   Comment: (NOTE)     The eGFR has been calculated using the CKD EPI equation.     This calculation has not been validated in all clinical situations.     eGFR's persistently <90 mL/min signify possible Chronic Kidney     Disease.   Anion gap 16 (*) 5 - 15  TROPONIN I     Status: None   Collection Time    02/07/14 12:00 AM      Result Value Ref Range   Troponin I <0.30  <0.30 ng/mL   Comment:            Due to the release kinetics of cTnI,     a negative result within the first hours     of the onset of symptoms does not rule out     myocardial infarction with certainty.     If myocardial infarction is still suspected,     repeat the test at appropriate intervals.  ETHANOL     Status: None   Collection Time    02/07/14 12:00 AM      Result Value Ref Range   Alcohol, Ethyl (B) <11  0 - 11 mg/dL   Comment:  LOWEST DETECTABLE LIMIT FOR     SERUM ALCOHOL IS 11 mg/dL     FOR MEDICAL PURPOSES ONLY  SALICYLATE LEVEL     Status: Abnormal   Collection Time    02/07/14 12:00 AM      Result Value Ref Range   Salicylate Lvl <2.6 (*) 2.8 - 20.0 mg/dL  ACETAMINOPHEN LEVEL     Status: None   Collection Time    02/07/14 12:00 AM      Result Value Ref Range   Acetaminophen (Tylenol), Serum <15.0  10 - 30 ug/mL   Comment:            THERAPEUTIC CONCENTRATIONS VARY     SIGNIFICANTLY. A RANGE OF 10-30     ug/mL MAY BE AN EFFECTIVE     CONCENTRATION FOR MANY PATIENTS.     HOWEVER, SOME ARE BEST TREATED     AT CONCENTRATIONS OUTSIDE THIS     RANGE.     ACETAMINOPHEN CONCENTRATIONS     >150 ug/mL AT 4 HOURS AFTER     INGESTION AND >50 ug/mL AT 12     HOURS AFTER INGESTION ARE     OFTEN ASSOCIATED WITH TOXIC     REACTIONS.  CBG  MONITORING, ED     Status: Abnormal   Collection Time    02/07/14 12:03 AM      Result Value Ref Range   Glucose-Capillary 66 (*) 70 - 99 mg/dL  CBG MONITORING, ED     Status: None   Collection Time    02/07/14 12:47 AM      Result Value Ref Range   Glucose-Capillary 74  70 - 99 mg/dL  CBG MONITORING, ED     Status: None   Collection Time    02/07/14  1:35 AM      Result Value Ref Range   Glucose-Capillary 98  70 - 99 mg/dL  GLUCOSE, CAPILLARY     Status: Abnormal   Collection Time    02/07/14  2:29 AM      Result Value Ref Range   Glucose-Capillary 118 (*) 70 - 99 mg/dL  GLUCOSE, CAPILLARY     Status: Abnormal   Collection Time    02/07/14  3:38 AM      Result Value Ref Range   Glucose-Capillary 157 (*) 70 - 99 mg/dL  GLUCOSE, CAPILLARY     Status: Abnormal   Collection Time    02/07/14  4:49 AM      Result Value Ref Range   Glucose-Capillary 185 (*) 70 - 99 mg/dL  GLUCOSE, CAPILLARY     Status: Abnormal   Collection Time    02/07/14  5:51 AM      Result Value Ref Range   Glucose-Capillary 241 (*) 70 - 99 mg/dL  GLUCOSE, CAPILLARY     Status: Abnormal   Collection Time    02/07/14  6:40 AM      Result Value Ref Range   Glucose-Capillary 243 (*) 70 - 99 mg/dL  BASIC METABOLIC PANEL     Status: Abnormal   Collection Time    02/07/14  6:59 AM      Result Value Ref Range   Sodium 134 (*) 137 - 147 mEq/L   Potassium 5.4 (*) 3.7 - 5.3 mEq/L   Chloride 94 (*) 96 - 112 mEq/L   CO2 25  19 - 32 mEq/L   Glucose, Bld 268 (*) 70 - 99 mg/dL   BUN 37 (*) 6 - 23  mg/dL   Creatinine, Ser 3.71 (*) 0.50 - 1.10 mg/dL   Calcium 9.1  8.4 - 10.5 mg/dL   GFR calc non Af Amer 13 (*) >90 mL/min   GFR calc Af Amer 15 (*) >90 mL/min   Comment: (NOTE)     The eGFR has been calculated using the CKD EPI equation.     This calculation has not been validated in all clinical situations.     eGFR's persistently <90 mL/min signify possible Chronic Kidney     Disease.   Anion gap 15  5 - 15   CBC     Status: Abnormal   Collection Time    02/07/14  6:59 AM      Result Value Ref Range   WBC 6.4  4.0 - 10.5 K/uL   Comment: WHITE COUNT CONFIRMED ON SMEAR   RBC 3.91  3.87 - 5.11 MIL/uL   Hemoglobin 12.1  12.0 - 15.0 g/dL   HCT 36.8  36.0 - 46.0 %   MCV 94.1  78.0 - 100.0 fL   MCH 30.9  26.0 - 34.0 pg   MCHC 32.9  30.0 - 36.0 g/dL   RDW 18.0 (*) 11.5 - 15.5 %   Platelets 161  150 - 400 K/uL   Comment: PLATELET COUNT CONFIRMED BY SMEAR  GLUCOSE, CAPILLARY     Status: Abnormal   Collection Time    02/07/14  7:51 AM      Result Value Ref Range   Glucose-Capillary 276 (*) 70 - 99 mg/dL  GLUCOSE, CAPILLARY     Status: Abnormal   Collection Time    02/07/14  9:31 AM      Result Value Ref Range   Glucose-Capillary 252 (*) 70 - 99 mg/dL  GLUCOSE, CAPILLARY     Status: Abnormal   Collection Time    02/07/14 10:36 AM      Result Value Ref Range   Glucose-Capillary 284 (*) 70 - 99 mg/dL  GLUCOSE, CAPILLARY     Status: Abnormal   Collection Time    02/07/14 11:47 AM      Result Value Ref Range   Glucose-Capillary 297 (*) 70 - 99 mg/dL  GLUCOSE, CAPILLARY     Status: Abnormal   Collection Time    02/07/14  1:39 PM      Result Value Ref Range   Glucose-Capillary 257 (*) 70 - 99 mg/dL  GLUCOSE, CAPILLARY     Status: Abnormal   Collection Time    02/07/14  5:15 PM      Result Value Ref Range   Glucose-Capillary 349 (*) 70 - 99 mg/dL  GLUCOSE, CAPILLARY     Status: Abnormal   Collection Time    02/07/14  9:31 PM      Result Value Ref Range   Glucose-Capillary 216 (*) 70 - 99 mg/dL  GLUCOSE, CAPILLARY     Status: Abnormal   Collection Time    02/08/14  8:01 AM      Result Value Ref Range   Glucose-Capillary 289 (*) 70 - 99 mg/dL     ROS: see hpi for positives   Physical Exam: Filed Vitals:   02/08/14 0641  BP: 190/76  Pulse:   Temp:   Resp:      General: alert bf thin nad, appropriate HEENT: [place], n0nicteric , mmm Neck: supple, no jvd Heart: RRR, soft 1/6  sem lsb , no rub or gallop Lungs: CTA bilaterrally Abdomen: Bs pos. ,soft, non tender. non distended  Extremities: no pedal edema Skin:  No overt rash / warm dry Neuro: alert OX3/ moves all extrem . No acute focal deficits noted  Dialysis Access: pos bruit L UA AVF  Dialysis Orders: [facility] . EDW 56 kg HD Bath = 2.0 ca, 2.0 k  Time 4hrs Heparin 1,800. Access LUA AVF BFR 400 DFR a 1.5    Hectrol 3 mcg IV/HD /aranesp 0   Units IV/HD  Venofer  166m q hd until 02/21/14  Other last op hgb 11.9  01/31/14  Assessment/Plan 1. Hypoglycemic event In DM type 2- Medication related vs infection - admit team managing  2. UTI- admit team rx with Iv rocephin / cs pending  3. ESRD -  HD MWF Missed last tx  K 3.0 on admit  And given  k iv 10 meq x3    With k 5.4/  reck k may give Kayexalate if higher / volume okay  Today /  Does not appear uremic /next hd in am on schedule, can dc her  home  Lasix at dc With her on hd now 4. Hypertension/volume  - hd wit uf  To edw (obtain standing wts) and  On home bp med hydralazine , Norvasc, coreg  5. Anemia  - hgb 12.1  No esa / continue Fe load as op / dc home po fe at dc 6. Metabolic bone disease -   Iv vit d on hd / binder on hd 7. Nutrition - renal / carb mod diet  / renal vit   DErnest Haber PA-C [id number]/05/2013, 11:35 AM   I have seen and examined this patient and agree with plan as outlined in above note. Pt admitted following a hypoglycemic event with BS documented of 127(states she had been having hypoglycemic episodes since Dr. WRedmond Pullingincreased her Lantus dose from 10 to 18 units about 2 weeks ago). Missed HD yesterday because of this.   Hyperkalemia on AM labs - mild - likely related to having been given IV runs of KCL on admission (appears got 3 runs of 10 for K of 3).  We have ordered a stat repeat (not drawn yet) and if still up but not dangerously so will give a dose of kayexelate and plan on dialysis  tomorrow on her usual schedule.  Volume looks fine.  De Jaworski B,MD 02/08/2014 3:05 PM

## 2014-02-08 NOTE — Progress Notes (Addendum)
On call physician paged for pt's elevated BP after prn med admin. Pt on hydralazine, coreg, and norvasc at home - not ordered in hospital. On call physician state to continue with prn med and AM physician will follow up on home meds.

## 2014-02-08 NOTE — Care Management Note (Signed)
    M[place] 1 of 1   02/08/2014     2:46:30 PM CARE MANAGEMENT NOTE 02/08/2014  Patient:  Madeline [name],Madeline [name]   Account Number:  [id number]  Date Initiated:  02/08/2014  Documentation initiated by:  Letha CapeAYLOR,Antwone Capozzoli  Subjective/Objective Assessment:   dx hypoglycemia  admit- lives with grand daughter.  [facility] and Child psychotherapistocial Worker.     Action/Plan:   Anticipated DC Date:  02/09/2014   Anticipated DC Plan:  HOME W HOME HEALTH SERVICES      DC Planning Services  CM consult      [facility]  Resumption Of Svcs/PTA Provider   Choice offered to / List presented to:          [facility]  [facility] RN  HH-6 SOCIAL WORKER      [facility]  [facility]   Status of service:  In process, will continue to follow Medicare Important Message given?  NO (If response is "NO", the following Medicare IM given date fields will be blank) Date Medicare IM given:   Medicare IM given by:   Date Additional Medicare IM given:   Additional Medicare IM given by:    Discharge Disposition:    Per UR Regulation:  Reviewed for med. necessity/level of care/duration of stay  If discussed at Long Length of Stay Meetings, dates discussed:    Comments:  02/08/14 1444 Letha Capeeborah Shirle Provencal RN, BSN [phone] [facility], and Social Worker , will need to resume these services.

## 2014-02-08 NOTE — Progress Notes (Signed)
On call physician notified of pt's BP 190/76 after prn hydralazine admin.

## 2014-02-08 NOTE — Progress Notes (Signed)
[facility] PROGRESS NOTE  Madeline [name] ZOX:[id number] DOB: 1956-02-08 DOA: 02/06/2014 PCP: Pamelia Hoit, MD  Assessment/Plan: Principal Problem:   Hypoglycemia - resolved - continue home lantus at half dose  Active Problems:   HTN (hypertension), malignant - continue home regimen now that patient is more alert    Diabetes mellitus type II, uncontrolled - Please see above - continue diabetic diet.    Chronic diastolic heart failure -  Compensated. Continuing home regimen    Anemia of chronic renal failure - resolved and wnl    Seizure - most likely due to hypoglycemia - resolved    ESRD on hemodialysis - Nephrology on board and managing - will reassess next am    Urinary tract infection, site not specified - f/u urine culture - continue rocepine  Code Status: Full Family Communication: Discussed with patient and family members Disposition Plan: Most likely discharge after dialysis   Consultants:  Nephrology  Procedures:  Dialysis next a.m.  Antibiotics:  Rocephin  HPI/Subjective: No new complaints. Patient feels much better and is more alert  Objective: Filed Vitals:   02/08/14 1700  BP: 146/56  Pulse: 87  Temp: 99.5 F (37.5 C)  Resp: 20    Intake/Output Summary (Last 24 hours) at 02/08/14 1800 Last data filed at 02/08/14 0809  Gross per 24 hour  Intake      0 ml  Output    750 ml  Net   -750 ml   Filed Weights   02/07/14 0214  Weight: 61.1 kg (134 lb 11.2 oz)    Exam:   General:  Patient in no acute distress, alert and awake  Cardiovascular: Regular rate and rhythm, no murmurs rubs  Respiratory: Clear to auscultation bilaterally no wheezes  Abdomen: , Nondistended, nontender  Musculoskeletal: No cyanosis or clubbing   Data Reviewed: Basic Metabolic Panel:  Recent Labs Lab 02/07/14 02/07/14 0659 02/08/14 1555  NA 138 134*  --   K 3.0* 5.4* 4.3  CL 96 94*  --   CO2 26 25  --   GLUCOSE 66* 268*  --   BUN  35* 37*  --   CREATININE 3.68* 3.71*  --   CALCIUM 9.0 9.1  --    Liver Function Tests:  Recent Labs Lab 02/07/14  AST 26  ALT 16  ALKPHOS 129*  BILITOT 0.3  PROT 7.0  ALBUMIN 3.0*   No results found for this basename: LIPASE, AMYLASE,  in the last 168 hours No results found for this basename: AMMONIA,  in the last 168 hours CBC:  Recent Labs Lab 02/07/14 02/07/14 0659  WBC 5.8 6.4  NEUTROABS 3.3  --   HGB 11.7* 12.1  HCT 36.4 36.8  MCV 96.8 94.1  PLT 152 161   Cardiac Enzymes:  Recent Labs Lab 02/07/14  TROPONINI <0.30   BNP (last 3 results)  Recent Labs  02/22/13 0709 02/26/13 0546 03/15/13 1530  PROBNP 5090.0* 7568.0* 8133.0*   CBG:  Recent Labs Lab 02/07/14 1715 02/07/14 2131 02/08/14 0801 02/08/14 1150 02/08/14 1702  GLUCAP 349* 216* 289* 277* 384*    No results found for this or any previous visit (from the past 240 hour(s)).   Studies: Ct Head Wo Contrast  02/06/2014   CLINICAL DATA:  Seizures. Temperature of 95 degrees. Altered mental status. Dialysis patient.  EXAM: CT HEAD WITHOUT CONTRAST  TECHNIQUE: Contiguous axial images were obtained from the base of the skull through the vertex without intravenous contrast.  COMPARISON:  12/21/2013  FINDINGS: Ventricles and sulci appear symmetrical. No mass effect or midline shift. No abnormal extra-axial fluid collections. Gray-white matter junctions are distinct. Basal cisterns are not effaced. No evidence of acute intracranial hemorrhage. No depressed skull fractures. Visualized paranasal sinuses and mastoid air cells are not opacified.  IMPRESSION: No acute intracranial abnormalities.   Electronically Signed   By: Burman NievesWilliam  Stevens M.D.   On: 02/06/2014 23:56    Scheduled Meds: . amLODipine  10 mg Oral Daily  . aspirin EC  81 mg Oral Daily  . calcitRIOL  0.25 mcg Oral Daily  . calcium acetate  667 mg Oral TID WC  . carvedilol  25 mg Oral BID WC  . cefTRIAXone (ROCEPHIN)  IV  1 g Intravenous Q24H   . enoxaparin (LOVENOX) injection  30 mg Subcutaneous Q24H  . [START ON 02/09/2014] ferric gluconate (FERRLECIT/NULECIT) IV  62.5 mg Intravenous Q M,W,F-HD  . gabapentin  300 mg Oral BID  . hydrALAZINE  50 mg Oral TID  . Influenza vac split quadrivalent PF  0.5 mL Intramuscular Tomorrow-1000  . insulin aspart  0-9 Units Subcutaneous TID WC  . insulin glargine  5 Units Subcutaneous QHS  . isosorbide mononitrate  120 mg Oral Daily  . multivitamin  1 tablet Oral QHS  . pantoprazole  40 mg Oral BID  . sodium chloride  3 mL Intravenous Q12H   Continuous Infusions:     Time spent: > 35 minutes    Penny PiaVEGA, Sophy Mesler  [facility] Pager [phone] If 7PM-7AM, please contact night-coverage at [email or web address], password [email or web address] 02/08/2014, 6:00 PM  LOS: 2 days

## 2014-02-08 NOTE — Progress Notes (Addendum)
On call physician paged due to pt's 3 BMs since 2200 rocephin. Last BM diarrhea. Cdiff sample ordered.

## 2014-02-08 NOTE — Progress Notes (Signed)
Results for Brock BadGE, Demiah (MRN [id number]) as of 02/08/2014 11:32  Ref. Range 02/07/2014 11:47 02/07/2014 13:39 02/07/2014 17:15 02/07/2014 21:31 02/08/2014 08:01  Glucose-Capillary Latest Range: 70-99 mg/dL 191297 (H) 478257 (H) 295349 (H) 216 (H) 289 (H)  Admitted with low blood sugar, seizure, ESRD. CBGS beginning to trend up greater than 180 mg/dl.  Recommend starting home dose of Lantus 10 units daily and continuing Novolog SENSITIVE correction scale TID& HS.  Will continue to follow while in hospital.  Smith MinceKendra Ovid Witman RN BSN CDE

## 2014-02-09 LAB — CBC
HCT: 34.1 % — ABNORMAL LOW (ref 36.0–46.0)
Hemoglobin: 10.8 g/dL — ABNORMAL LOW (ref 12.0–15.0)
MCH: 31 pg (ref 26.0–34.0)
MCHC: 31.7 g/dL (ref 30.0–36.0)
MCV: 98 fL (ref 78.0–100.0)
PLATELETS: 169 10*3/uL (ref 150–400)
RBC: 3.48 MIL/uL — ABNORMAL LOW (ref 3.87–5.11)
RDW: 17.9 % — AB (ref 11.5–15.5)
WBC: 4.9 10*3/uL (ref 4.0–10.5)

## 2014-02-09 LAB — RENAL FUNCTION PANEL
Albumin: 2.9 g/dL — ABNORMAL LOW (ref 3.5–5.2)
Anion gap: 16 — ABNORMAL HIGH (ref 5–15)
BUN: 39 mg/dL — ABNORMAL HIGH (ref 6–23)
CALCIUM: 8.5 mg/dL (ref 8.4–10.5)
CO2: 22 meq/L (ref 19–32)
Chloride: 99 mEq/L (ref 96–112)
Creatinine, Ser: 4.04 mg/dL — ABNORMAL HIGH (ref 0.50–1.10)
GFR, EST AFRICAN AMERICAN: 13 mL/min — AB (ref >90)
GFR, EST NON AFRICAN AMERICAN: 11 mL/min — AB (ref >90)
Glucose, Bld: 438 mg/dL — ABNORMAL HIGH (ref 70–99)
PHOSPHORUS: 6.1 mg/dL — AB (ref 2.3–4.6)
Potassium: 4.4 mEq/L (ref 3.7–5.3)
SODIUM: 137 meq/L (ref 137–147)

## 2014-02-09 LAB — BASIC METABOLIC PANEL
ANION GAP: 15 (ref 5–15)
BUN: 39 mg/dL — ABNORMAL HIGH (ref 6–23)
CALCIUM: 8.6 mg/dL (ref 8.4–10.5)
CHLORIDE: 101 meq/L (ref 96–112)
CO2: 25 meq/L (ref 19–32)
Creatinine, Ser: 4.17 mg/dL — ABNORMAL HIGH (ref 0.50–1.10)
GFR calc Af Amer: 13 mL/min — ABNORMAL LOW (ref >90)
GFR calc non Af Amer: 11 mL/min — ABNORMAL LOW (ref >90)
Glucose, Bld: 183 mg/dL — ABNORMAL HIGH (ref 70–99)
Potassium: 4.5 mEq/L (ref 3.7–5.3)
SODIUM: 141 meq/L (ref 137–147)

## 2014-02-09 LAB — GLUCOSE, CAPILLARY: Glucose-Capillary: 349 mg/dL — ABNORMAL HIGH (ref 70–99)

## 2014-02-09 LAB — HEPATITIS B SURFACE ANTIGEN: Hepatitis B Surface Ag: NEGATIVE

## 2014-02-09 MED ORDER — INSULIN GLARGINE 100 UNIT/ML ~~LOC~~ SOLN: 8.0000 [IU] | Freq: Every day | SUBCUTANEOUS | Status: DC

## 2014-02-09 NOTE — Progress Notes (Signed)
NURSING PROGRESS NOTE  Madeline [name] [id number] Discharge Data: 02/09/2014 5:31 PM Attending Provider: No att. providers found WUJ:[id number],NWGNPCP:WILSON,FRED Sherilyn CooterHENRY, MD   [name] to be D/C'd Home per MD order.    All IV's will be discontinued and monitored for bleeding.  All belongings will be returned to patient for patient to take home.  Patient refused to allow nurse to go over AVS. Patient said to just let me sign the papers so I can go.   Last Documented Vital Signs:  Blood pressure 142/91, pulse 74, temperature 98.5 F (36.9 C), temperature source Oral, resp. rate 16, height 4\' 11"  (1.499 m), weight 58.8 kg (129 lb 10.1 oz), SpO2 100.00%.  [name], MSN, RN, [employer] EnergyCMSRN

## 2014-02-09 NOTE — Progress Notes (Signed)
Patient ID: Madeline [name], female   DOB: 1955-08-09, 58 y.o.   MRN: [id number]  [facility] Progress Note   Assessment/ Plan:   1. Hypoglycemic event In DM type 2- appears to be related to recent up titration of Lantus dose rather than infection, further management per hospitalist service 2. UTI- admit team rx with Iv rocephin / cs pending  3. ESRD - plan for hemodialysis today per usual outpatient schedule (recognize she missed Wednesday) 4. Hypertension/volume - blood pressures within acceptable limits, she appears to be euvolemic on physical exam and will attempt to get her back to her dry weight on dialysis. 5. Anemia - hgb 12.1 No esa / continue Fe load as op / dc home po fe at dc 6. Metabolic bone disease - resumed vitamin D receptor analogs for PTH suppression and phosphorus binders with meals. 7. Nutrition - renal / carb mod diet / renal vit   Subjective:   Reports she had a good night without any emerging complaints except for concerns with "sugars still in the 300s". Awaiting dialysis this morning    Objective:   BP 135/60  Pulse 64  Temp(Src) 98.4 F (36.9 C) (Oral)  Resp 18  Ht 4\' 11"  (1.499 m)  Wt 61.1 kg (134 lb 11.2 oz)  BMI 27.19 kg/m2  SpO2 100%  Physical Exam: Gen: Comfortably resting in bed CVS: Pulse regular in rate and rhythm, S1 and S2 normal Resp: Clear to auscultation bilaterally, no rales/rhonchi Abd: Soft, flat, nontender Ext: No lower extremity edema. Left brachiocephalic fistula  Labs: BMET  Recent Labs Lab 02/07/14 02/07/14 0659 02/08/14 1555 02/09/14 0527  NA 138 134*  --  141  K 3.0* 5.4* 4.3 4.5  CL 96 94*  --  101  CO2 26 25  --  25  GLUCOSE 66* 268*  --  183*  BUN 35* 37*  --  39*  CREATININE 3.68* 3.71*  --  4.17*  CALCIUM 9.0 9.1  --  8.6   CBC  Recent Labs Lab 02/07/14 02/07/14 0659  WBC 5.8 6.4  NEUTROABS 3.3  --   HGB 11.7* 12.1  HCT 36.4 36.8  MCV 96.8 94.1  PLT 152 161   Medications:    . amLODipine   10 mg Oral Daily  . aspirin EC  81 mg Oral Daily  . calcitRIOL  0.25 mcg Oral Daily  . calcium acetate  667 mg Oral TID WC  . carvedilol  25 mg Oral BID WC  . cefTRIAXone (ROCEPHIN)  IV  1 g Intravenous Q24H  . enoxaparin (LOVENOX) injection  30 mg Subcutaneous Q24H  . ferric gluconate (FERRLECIT/NULECIT) IV  62.5 mg Intravenous Q M,W,F-HD  . gabapentin  300 mg Oral BID  . hydrALAZINE  50 mg Oral TID  . Influenza vac split quadrivalent PF  0.5 mL Intramuscular Tomorrow-1000  . insulin aspart  0-9 Units Subcutaneous TID WC  . insulin glargine  5 Units Subcutaneous QHS  . isosorbide mononitrate  120 mg Oral Daily  . multivitamin  1 tablet Oral QHS  . pantoprazole  40 mg Oral BID  . sodium chloride  3 mL Intravenous Q12H   Zetta BillsJay Bhargav Barbaro, MD 02/09/2014, 8:59 AM

## 2014-02-09 NOTE — Discharge Summary (Signed)
Physician Discharge Summary  [name] ZOX:[id number] DOB: 1955/09/05 DOA: 02/06/2014  PCP: Madeline [name],[name], MD  Admit date: 02/06/2014 Discharge date: 02/09/2014  Time spent: > 35 minutes  Recommendations for Outpatient Follow-up:  1. Please refer to discharge instructions below  Discharge Diagnoses:  Principal Problem:   Hypoglycemia Active Problems:   HTN (hypertension), malignant   Diabetes mellitus type II, uncontrolled   Chronic diastolic heart failure   Anemia of chronic renal failure   Seizure   ESRD on hemodialysis   Urinary tract infection, site not specified   Discharge Condition: Stable  Diet recommendation: Diabetic and renal diet  Filed Weights   02/07/14 0214 02/09/14 0908 02/09/14 1311  Weight: 61.1 kg (134 lb 11.2 oz) 62.6 kg (138 lb 0.1 oz) 58.8 kg (129 lb 10.1 oz)    History of present illness:  From original history of present illness Madeline [name] is a 58 y.o. female with a history of ESRD on HD, Uncontrolled DM2, HTN who was brought to the ED after an episode of shaking all over and unresponsiveneness at home which was witness by her family. The history is provided by her grand-daughter who is at the bedside and she reports that her grandmother took her medications at 9:06 pm and A few minutes later she had vomiting and vomited up her medications, and then she laid down shortly thereafter She began to shake and jerk all over. She had decreased responsiveness and her family called EMS, when EMS arrived and check her blood sugar it was 15  Hospital Course:  Principal Problem:  Hypoglycemia  - resolved   Active Problems:  HTN (hypertension), malignant  - continue home regimen n  Diabetes mellitus type II, uncontrolled  - Recommend diabetic diet -We'll decrease Lantus to 8 units given that she presented with hypoglycemia although I suspect she may have taken more of her medicine then she should have. This was discussed with family who will look at her  medications closely and address with caretaker  Chronic diastolic heart failure  - Compensated. Continuing home regimen   Anemia of chronic renal failure  - resolved and wnl   Seizure  - most likely due to hypoglycemia  - resolved   ESRD on hemodialysis  - Nephrology on board and managed while patient was in house - Patient to continue routine schedule for hemodialysis  Urinary tract infection, site not specified  - Patient received 3 days of Rocephin as such has received adequate therapy   Procedures:  None  Consultations:  None  Discharge Exam: Filed Vitals:   02/09/14 1311  BP: 142/91  Pulse: 74  Temp: 98.5 F (36.9 C)  Resp: 16    General: Patient in no acute distress, alert and awake Cardiovascular: Regular rate and rhythm, no murmurs or rubs Respiratory: clear to auscultation bilaterally no wheezes  Discharge Instructions You were cared for by a hospitalist during your hospital stay. If you have any questions about your discharge medications or the care you received while you were in the hospital after you are discharged, you can call the unit and asked to speak with the hospitalist on call if the hospitalist that took care of you is not available. Once you are discharged, your primary care physician will handle any further medical issues. Please note that NO REFILLS for any discharge medications will be authorized once you are discharged, as it is imperative that you return to your primary care physician (or establish a relationship with a primary care physician  if you do not have one) for your aftercare needs so that they can reassess your need for medications and monitor your lab values.  Discharge Instructions   Call MD for:  difficulty breathing, headache or visual disturbances    Complete by:  As directed      Call MD for:  temperature >100.4    Complete by:  As directed      Diet - low sodium heart healthy    Complete by:  As directed      Discharge  instructions    Complete by:  As directed   Please continue to monitor your blood sugars daily preprandial and postprandial.  Recommend you followup with your primary care physician within the next one to 2 weeks or sooner should any new concerns arise     Increase activity slowly    Complete by:  As directed           Current Discharge Medication List    CONTINUE these medications which have CHANGED   Details  insulin glargine (LANTUS) 100 UNIT/ML injection Inject 0.08 mLs (8 Units total) into the skin at bedtime. Qty: 10 mL, Refills: 11      CONTINUE these medications which have NOT CHANGED   Details  acetaminophen (TYLENOL) 500 MG tablet Take 500-1,000 mg by mouth every 6 (six) hours as needed for moderate pain.    albuterol (PROVENTIL HFA;VENTOLIN HFA) 108 (90 BASE) MCG/ACT inhaler Inhale 2 puffs into the lungs every 6 (six) hours as needed for wheezing or shortness of breath.     amLODipine (NORVASC) 10 MG tablet Take 10 mg by mouth daily.     aspirin EC 81 MG tablet Take 81 mg by mouth daily.    calcitRIOL (ROCALTROL) 0.25 MCG capsule Take 0.25 mcg by mouth daily.    calcium acetate (PHOSLO) 667 MG capsule Take 667 mg by mouth 3 (three) times daily with meals.    carvedilol (COREG) 25 MG tablet Take 25 mg by mouth 2 (two) times daily with a meal.    ferrous sulfate 325 (65 FE) MG tablet Take 325 mg by mouth every Monday, Wednesday, and Friday with hemodialysis.     furosemide (LASIX) 80 MG tablet Take 80 mg by mouth 2 (two) times daily.    gabapentin (NEURONTIN) 300 MG capsule Take 300 mg by mouth 2 (two) times daily.    hydrALAZINE (APRESOLINE) 100 MG tablet Take 50 mg by mouth 3 (three) times daily.     HYDROcodone-acetaminophen (NORCO/VICODIN) 5-325 MG per tablet Take 1 tablet by mouth every 6 (six) hours as needed for moderate pain.    insulin lispro (HUMALOG) 100 UNIT/ML injection Inject 3-10 Units into the skin 3 (three) times daily before meals. Sliding scale.   CBG 200: 3 UNITS, 250 5 UNITS, 300 7 UNITS, 350 9-10 UNITS    isosorbide mononitrate (IMDUR) 120 MG 24 hr tablet Take 120 mg by mouth daily.    metolazone (ZAROXOLYN) 5 MG tablet Take 5 mg by mouth 2 (two) times daily.    nitroGLYCERIN (NITROSTAT) 0.4 MG SL tablet Place 0.4 mg under the tongue every 5 (five) minutes as needed for chest pain.     pantoprazole (PROTONIX) 40 MG tablet Take 1 tablet (40 mg total) by mouth 2 (two) times daily. Qty: 60 tablet, Refills: 1    promethazine (PHENERGAN) 25 MG tablet Take 25 mg by mouth 2 (two) times daily as needed for nausea or vomiting.     traMADol (ULTRAM) 50  MG tablet Take 50 mg by mouth every 12 (twelve) hours as needed for severe pain.        No Known Allergies    The results of significant diagnostics from this hospitalization (including imaging, microbiology, ancillary and laboratory) are listed below for reference.    Significant Diagnostic Studies: Ct Head Wo Contrast  02/06/2014   CLINICAL DATA:  Seizures. Temperature of 95 degrees. Altered mental status. Dialysis patient.  EXAM: CT HEAD WITHOUT CONTRAST  TECHNIQUE: Contiguous axial images were obtained from the base of the skull through the vertex without intravenous contrast.  COMPARISON:  12/21/2013  FINDINGS: Ventricles and sulci appear symmetrical. No mass effect or midline shift. No abnormal extra-axial fluid collections. Gray-white matter junctions are distinct. Basal cisterns are not effaced. No evidence of acute intracranial hemorrhage. No depressed skull fractures. Visualized paranasal sinuses and mastoid air cells are not opacified.  IMPRESSION: No acute intracranial abnormalities.   Electronically Signed   By: Burman Nieves M.D.   On: 02/06/2014 23:56    Microbiology: No results found for this or any previous visit (from the past 240 hour(s)).   Labs: Basic Metabolic Panel:  Recent Labs Lab 02/07/14 02/07/14 0659 02/08/14 1555 02/09/14 0527 02/09/14 0920  NA  138 134*  --  141 137  K 3.0* 5.4* 4.3 4.5 4.4  CL 96 94*  --  101 99  CO2 26 25  --  25 22  GLUCOSE 66* 268*  --  183* 438*  BUN 35* 37*  --  39* 39*  CREATININE 3.68* 3.71*  --  4.17* 4.04*  CALCIUM 9.0 9.1  --  8.6 8.5  PHOS  --   --   --   --  6.1*   Liver Function Tests:  Recent Labs Lab 02/07/14 02/09/14 0920  AST 26  --   ALT 16  --   ALKPHOS 129*  --   BILITOT 0.3  --   PROT 7.0  --   ALBUMIN 3.0* 2.9*   No results found for this basename: LIPASE, AMYLASE,  in the last 168 hours No results found for this basename: AMMONIA,  in the last 168 hours CBC:  Recent Labs Lab 02/07/14 02/07/14 0659 02/09/14 0920  WBC 5.8 6.4 4.9  NEUTROABS 3.3  --   --   HGB 11.7* 12.1 10.8*  HCT 36.4 36.8 34.1*  MCV 96.8 94.1 98.0  PLT 152 161 169   Cardiac Enzymes:  Recent Labs Lab 02/07/14  TROPONINI <0.30   BNP: BNP (last 3 results)  Recent Labs  02/22/13 0709 02/26/13 0546 03/15/13 1530  PROBNP 5090.0* 7568.0* 8133.0*   CBG:  Recent Labs Lab 02/08/14 0801 02/08/14 1150 02/08/14 1702 02/08/14 2302 02/09/14 0755  GLUCAP 289* 277* 384* 213* 349*       Signed:  Penny Pia  [facility] 02/09/2014, 5:12 PM

## 2014-02-09 NOTE — Procedures (Signed)
Patient seen on Hemodialysis. QB 400, UF goal 4.5L Treatment adjusted as needed.  Margurette Brener MD [facility]. Office # [phone] Pager # [phone] 9:32 AM  

## 2014-04-19 ENCOUNTER — Encounter (HOSPITAL_COMMUNITY): Payer: Self-pay | Admitting: Internal Medicine

## 2014-07-05 ENCOUNTER — Ambulatory Visit (HOSPITAL_COMMUNITY)
Admission: RE | Admit: 2014-07-05 | Discharge: 2014-07-05 | Disposition: A | Discharge status: Home / Self Care | Payer: Medicaid Other | Source: Ambulatory Visit | Attending: Internal Medicine | Admitting: Internal Medicine

## 2014-07-05 VITALS — BP 102/54 | HR 73 | Wt 136.8 lb

## 2014-07-05 DIAGNOSIS — N186 End stage renal disease: Secondary | ICD-10-CM | POA: Diagnosis not present

## 2014-07-05 DIAGNOSIS — I5032 Chronic diastolic (congestive) heart failure: Secondary | ICD-10-CM | POA: Diagnosis present

## 2014-07-05 DIAGNOSIS — I10 Essential (primary) hypertension: Secondary | ICD-10-CM | POA: Insufficient documentation

## 2014-07-05 DIAGNOSIS — D649 Anemia, unspecified: Secondary | ICD-10-CM | POA: Insufficient documentation

## 2014-07-05 DIAGNOSIS — G4733 Obstructive sleep apnea (adult) (pediatric): Secondary | ICD-10-CM | POA: Diagnosis not present

## 2014-07-05 MED ORDER — NITROGLYCERIN 0.4 MG SL SUBL: 0.4000 mg | SUBLINGUAL_TABLET | SUBLINGUAL | Status: DC | PRN

## 2014-07-05 NOTE — Progress Notes (Signed)
Patient ID: Madeline [name], female   DOB: [birth year], 59 y.o.   MRN: [id number]   Weight Range  60-61 kg   Baseline proBNP     PCP: Dr Andrey CampanileWilson Nephrologist: Dr Arrie Aranoladonato Pulmonologist: None  HPI: Madeline [name] is a 59 y/o F with history of HFpEF (RHF/anasarca with pulmonary HTN), CKD stage 3, suspected CAD, DM, chronic anemia, OSA (not recently using CPAP) who [place] [facility] [place]. Her daughter is a Film/video editordirector of nursing at [employer]. She also has a history of  01/2012 with NSTEMI, pulm HTN and SOB. She underwent CTA which was negative for PE but resulted in ARF requiring temporary dialysis (1 session). Invasive ischemic evaluation was not pursued. She reports remote heart cath in 1980s but none recently including RHC. She does have h/o prior normal Lexiscan nuc in 12/2011 (before that hospitalization). She has had several admissions to the hospital for CHF, CP rule-out, and one in 09/2012 for metabolic encephalopathy with question of aseptic meningitis, CVA (with reportedly negative MRI), oliguric ATN/ARF, and CHF.  [facility] through 03/05/13 with acute respiratory failure due to volume overload complicated by CKD. ECHO 10/8/14EF 65% RV dilated.  Lasix drip used but ultimately required HD. AVF placed while hospitalized. Had acute blood loss and received 3UPRBCs.  D/C weight 150 pounds.    RHC 02/28/13 RA = 14  RV = 75/8/18  PA = 69/16 (43)  PCW = 29 with v-waves to 50 (check on in R and L PAs)  Fick cardiac output/index = 5.4/3.1  PVR = 2.8  FA sat = 97%  PA sat = 56%, 57%   Readmitted 03/16/13 through 03/18/13 with lethargy and acute blood loss anemia. GI consulted small enteric ulcers noted Received 2UPRBCs. Hemoglobin 8> 9.4. Discharged on lasix 160 mg twice a day and Metolazone 5 mg twice a day. Discharge weight 160 pounds  RHC 02/28/13 RA = 14  RV = 75/8/18  PA = 69/16 (43)  PCW = 29 with v-waves to 50 (check on in R and L PAs)  Fick  cardiac output/index = 5.4/3.1  PVR = 2.8  FA sat = 97%  PA sat = 56%, 57%   She returns for follow up. Denies SOB/PND/Orthopnea. Has started HD in July. HD M-W-F. Dry weight 60-61 kg. Walks with a rolling walker.  Daughter prepares pill box.      ROS: All systems negative except as listed in HPI, PMH and Problem List.  Past Medical History  Diagnosis Date  . CHF (congestive heart failure)     a. HFpEF with RHF/anasarca/pHTN.  . COPD (chronic obstructive pulmonary disease)   . Coronary artery disease     a. Per [place] records: NSTEMI 01/2012, tx medically given ARF but suspected CAD. b. Stress test 12/16/11 reported w/o ischemia.  . Hypertension   . Diabetes mellitus   . OSA (obstructive sleep apnea)     a. Pt reported used to use CPAP in [place], but "[facility] out" [facility].  Marland Kitchen. CKD (chronic kidney disease), stage III     a. Per [place] records: h/o ARF after CTA that ruled out PE.  Marland Kitchen. Pulmonary hypertension     a. RHC 02/28/13: mod pulm HTN with normal PVR suggestive of predominantly pulmonary venous HTN.  . Right heart failure   . Anasarca     a. Per  records - due to pulm HTN with R HF.   Marland Kitchen. Aseptic meningitis  a. 09/2012: adm in [place] for metabolic encephalopathy, oliguric tubular necrosis, anemia, HTN, possible CVA, HHNKA.  Marland Kitchen CVA (cerebral infarction)     a. Per [place] records, "possible CVA" 09/2012 but MRI reportedly negative.  . Abnormal Doppler ultrasound of carotid artery     a. Per [place] records: <50% LICA.  Marland Kitchen Renal insufficiency     Current Outpatient Prescriptions  Medication Sig Dispense Refill  . acetaminophen (TYLENOL) 500 MG tablet Take 500-1,000 mg by mouth every 6 (six) hours as needed for moderate pain.    Marland Kitchen albuterol (PROVENTIL HFA;VENTOLIN HFA) 108 (90 BASE) MCG/ACT inhaler Inhale 2 puffs into the lungs every 6 (six) hours as needed for wheezing or shortness of breath.     Marland Kitchen amLODipine (NORVASC) 10 MG tablet Take 10 mg by mouth daily.     Marland Kitchen aspirin EC 81 MG tablet Take 81 mg  by mouth daily.    . calcitRIOL (ROCALTROL) 0.25 MCG capsule Take 0.25 mcg by mouth daily.    . calcium acetate (PHOSLO) 667 MG capsule Take 667 mg by mouth 3 (three) times daily with meals.    . carvedilol (COREG) 25 MG tablet Take 25 mg by mouth 2 (two) times daily with a meal.    . ferrous sulfate 325 (65 FE) MG tablet Take 325 mg by mouth every Monday, Wednesday, and Friday with hemodialysis.     Marland Kitchen gabapentin (NEURONTIN) 300 MG capsule Take 300 mg by mouth 2 (two) times daily.    . insulin glargine (LANTUS) 100 UNIT/ML injection Inject 0.08 mLs (8 Units total) into the skin at bedtime. 10 mL 11  . insulin lispro (HUMALOG) 100 UNIT/ML injection Inject 3-10 Units into the skin 3 (three) times daily before meals. Sliding scale.  CBG 200: 3 UNITS, 250 5 UNITS, 300 7 UNITS, 350 9-10 UNITS    . isosorbide mononitrate (IMDUR) 120 MG 24 hr tablet Take 120 mg by mouth daily.    . nitroGLYCERIN (NITROSTAT) 0.4 MG SL tablet Place 0.4 mg under the tongue every 5 (five) minutes as needed for chest pain.     . pantoprazole (PROTONIX) 40 MG tablet Take 1 tablet (40 mg total) by mouth 2 (two) times daily. 60 tablet 1  . promethazine (PHENERGAN) 25 MG tablet Take 25 mg by mouth 2 (two) times daily as needed for nausea or vomiting.     . sevelamer carbonate (RENVELA) 800 MG tablet Take 800 mg by mouth 3 (three) times daily with meals.    . traMADol (ULTRAM) 50 MG tablet Take 50 mg by mouth every 12 (twelve) hours as needed for severe pain.      No current facility-administered medications for this encounter.     PHYSICAL EXAM: Filed Vitals:   07/05/14 1455  BP: 102/54  Pulse: 73  Weight: 136 lb 12 oz (62.029 kg)  SpO2: 99%    General:  Fatigued appearing. No resp difficulty Sitting in wheelchair.  HEENT: normal Neck: supple. JVP Carotids 2+ bilaterally; no bruits. No lymphadenopathy or thryomegaly appreciated. Cor: PMI normal. Regular rate & rhythm. No rubs, gallops TR 2/6 Lungs: clear Abdomen:  soft, nontender, nondistended. No hepatosplenomegaly. No bruits or masses. Good bowel sounds. Extremities: no cyanosis, clubbing, rash, edema . LUE AVG Neuro: alert & orientedx3, cranial nerves grossly intact. Moves all 4 extremities w/o difficulty. Affect pleasant  ASSESSMENT & PLAN: 1. Chronic Diastolic Heart Failure ECHO 02/2013 EF 65%  Volume status ok.  Managed by HD M-W-F.  2. HTN - Stable. Continue  amlodipine 10 mg daily, carvedilol 25 mg twice a day,  and Imdur 120 mg bid. 3. ESRD- on HD. Followed by Dr Kathrene Bongo.   4.  Anemia- On Iron.  4. OSA-Does not use CPAP. Machine broken for 1 year.   Follow up in 1 year.    CLEGG,AMY NP-C 3:02 PM  Patient seen and examined with Tonye Becket, NP. We discussed all aspects of the encounter. I agree with the assessment and plan as stated above.   She is doing well with HD. Volume status and BP much improved. Can f/u in 1 year or as needed.   Daniel Bensimhon,MD 7:01 PM

## 2014-07-05 NOTE — Patient Instructions (Signed)
Your physician recommends that you schedule a follow-up appointment in: 1 year  Do the following things EVERYDAY: 1) Weigh yourself in the morning before breakfast. Write it down and keep it in a log. 2) Take your medicines as prescribed 3) Eat low salt foods-Limit salt (sodium) to 2000 mg per day.  4) Stay as active as you can everyday 5) Limit all fluids for the day to less than 2 liters 6)

## 2014-10-10 ENCOUNTER — Encounter (HOSPITAL_COMMUNITY): Payer: Self-pay | Admitting: *Deleted

## 2014-10-10 ENCOUNTER — Inpatient Hospital Stay (HOSPITAL_COMMUNITY)
Admission: EM | Admit: 2014-10-10 | Discharge: 2014-10-16 | DRG: 637 | Disposition: A | Discharge status: Home / Self Care | Payer: Medicaid Other | Attending: Internal Medicine | Admitting: Internal Medicine

## 2014-10-10 ENCOUNTER — Emergency Department (HOSPITAL_COMMUNITY): Payer: Medicaid Other

## 2014-10-10 DIAGNOSIS — Z833 Family history of diabetes mellitus: Secondary | ICD-10-CM

## 2014-10-10 DIAGNOSIS — E1122 Type 2 diabetes mellitus with diabetic chronic kidney disease: Secondary | ICD-10-CM | POA: Diagnosis not present

## 2014-10-10 DIAGNOSIS — Z992 Dependence on renal dialysis: Secondary | ICD-10-CM

## 2014-10-10 DIAGNOSIS — F17220 Nicotine dependence, chewing tobacco, uncomplicated: Secondary | ICD-10-CM | POA: Diagnosis present

## 2014-10-10 DIAGNOSIS — E876 Hypokalemia: Secondary | ICD-10-CM | POA: Diagnosis present

## 2014-10-10 DIAGNOSIS — R739 Hyperglycemia, unspecified: Secondary | ICD-10-CM | POA: Diagnosis present

## 2014-10-10 DIAGNOSIS — N2581 Secondary hyperparathyroidism of renal origin: Secondary | ICD-10-CM | POA: Diagnosis present

## 2014-10-10 DIAGNOSIS — Z90710 Acquired absence of both cervix and uterus: Secondary | ICD-10-CM | POA: Diagnosis not present

## 2014-10-10 DIAGNOSIS — R1013 Epigastric pain: Secondary | ICD-10-CM | POA: Diagnosis not present

## 2014-10-10 DIAGNOSIS — R1033 Periumbilical pain: Secondary | ICD-10-CM | POA: Diagnosis not present

## 2014-10-10 DIAGNOSIS — R278 Other lack of coordination: Secondary | ICD-10-CM | POA: Diagnosis present

## 2014-10-10 DIAGNOSIS — D638 Anemia in other chronic diseases classified elsewhere: Secondary | ICD-10-CM | POA: Diagnosis present

## 2014-10-10 DIAGNOSIS — E7220 Disorder of urea cycle metabolism, unspecified: Secondary | ICD-10-CM | POA: Diagnosis present

## 2014-10-10 DIAGNOSIS — D649 Anemia, unspecified: Secondary | ICD-10-CM

## 2014-10-10 DIAGNOSIS — N186 End stage renal disease: Secondary | ICD-10-CM

## 2014-10-10 DIAGNOSIS — Z794 Long term (current) use of insulin: Secondary | ICD-10-CM

## 2014-10-10 DIAGNOSIS — E871 Hypo-osmolality and hyponatremia: Secondary | ICD-10-CM | POA: Diagnosis present

## 2014-10-10 DIAGNOSIS — Z781 Physical restraint status: Secondary | ICD-10-CM | POA: Diagnosis not present

## 2014-10-10 DIAGNOSIS — J449 Chronic obstructive pulmonary disease, unspecified: Secondary | ICD-10-CM | POA: Diagnosis present

## 2014-10-10 DIAGNOSIS — R6510 Systemic inflammatory response syndrome (SIRS) of non-infectious origin without acute organ dysfunction: Secondary | ICD-10-CM | POA: Diagnosis present

## 2014-10-10 DIAGNOSIS — G4733 Obstructive sleep apnea (adult) (pediatric): Secondary | ICD-10-CM | POA: Diagnosis present

## 2014-10-10 DIAGNOSIS — I120 Hypertensive chronic kidney disease with stage 5 chronic kidney disease or end stage renal disease: Secondary | ICD-10-CM | POA: Diagnosis present

## 2014-10-10 DIAGNOSIS — Z452 Encounter for adjustment and management of vascular access device: Secondary | ICD-10-CM | POA: Diagnosis not present

## 2014-10-10 DIAGNOSIS — I272 Other secondary pulmonary hypertension: Secondary | ICD-10-CM | POA: Diagnosis present

## 2014-10-10 DIAGNOSIS — Z79899 Other long term (current) drug therapy: Secondary | ICD-10-CM

## 2014-10-10 DIAGNOSIS — R109 Unspecified abdominal pain: Secondary | ICD-10-CM | POA: Insufficient documentation

## 2014-10-10 DIAGNOSIS — R1010 Upper abdominal pain, unspecified: Secondary | ICD-10-CM | POA: Diagnosis not present

## 2014-10-10 DIAGNOSIS — E1100 Type 2 diabetes mellitus with hyperosmolarity without nonketotic hyperglycemic-hyperosmolar coma (NKHHC): Secondary | ICD-10-CM | POA: Diagnosis present

## 2014-10-10 DIAGNOSIS — R5383 Other fatigue: Secondary | ICD-10-CM

## 2014-10-10 DIAGNOSIS — R509 Fever, unspecified: Secondary | ICD-10-CM | POA: Diagnosis present

## 2014-10-10 DIAGNOSIS — A419 Sepsis, unspecified organism: Secondary | ICD-10-CM | POA: Diagnosis not present

## 2014-10-10 DIAGNOSIS — I2510 Atherosclerotic heart disease of native coronary artery without angina pectoris: Secondary | ICD-10-CM | POA: Diagnosis present

## 2014-10-10 DIAGNOSIS — R279 Unspecified lack of coordination: Secondary | ICD-10-CM | POA: Diagnosis not present

## 2014-10-10 DIAGNOSIS — G253 Myoclonus: Secondary | ICD-10-CM | POA: Diagnosis present

## 2014-10-10 DIAGNOSIS — Z7982 Long term (current) use of aspirin: Secondary | ICD-10-CM | POA: Diagnosis not present

## 2014-10-10 DIAGNOSIS — Z8249 Family history of ischemic heart disease and other diseases of the circulatory system: Secondary | ICD-10-CM

## 2014-10-10 DIAGNOSIS — I5032 Chronic diastolic (congestive) heart failure: Secondary | ICD-10-CM | POA: Diagnosis present

## 2014-10-10 DIAGNOSIS — G9341 Metabolic encephalopathy: Secondary | ICD-10-CM | POA: Diagnosis present

## 2014-10-10 DIAGNOSIS — E118 Type 2 diabetes mellitus with unspecified complications: Secondary | ICD-10-CM | POA: Diagnosis not present

## 2014-10-10 DIAGNOSIS — I252 Old myocardial infarction: Secondary | ICD-10-CM

## 2014-10-10 DIAGNOSIS — B349 Viral infection, unspecified: Secondary | ICD-10-CM | POA: Diagnosis present

## 2014-10-10 DIAGNOSIS — N189 Chronic kidney disease, unspecified: Secondary | ICD-10-CM

## 2014-10-10 DIAGNOSIS — R6521 Severe sepsis with septic shock: Secondary | ICD-10-CM | POA: Diagnosis not present

## 2014-10-10 DIAGNOSIS — E119 Type 2 diabetes mellitus without complications: Secondary | ICD-10-CM

## 2014-10-10 LAB — COMPREHENSIVE METABOLIC PANEL
ALK PHOS: 334 U/L — AB (ref 38–126)
ALT: 41 U/L (ref 14–54)
ANION GAP: 15 (ref 5–15)
AST: 58 U/L — ABNORMAL HIGH (ref 15–41)
Albumin: 3.5 g/dL (ref 3.5–5.0)
BILIRUBIN TOTAL: 0.9 mg/dL (ref 0.3–1.2)
BUN: 10 mg/dL (ref 6–20)
CO2: 25 mmol/L (ref 22–32)
Calcium: 9.2 mg/dL (ref 8.9–10.3)
Chloride: 80 mmol/L — ABNORMAL LOW (ref 101–111)
Creatinine, Ser: 3.1 mg/dL — ABNORMAL HIGH (ref 0.44–1.00)
GFR calc Af Amer: 18 mL/min — ABNORMAL LOW (ref >60)
GFR, EST NON AFRICAN AMERICAN: 16 mL/min — AB (ref >60)
Glucose, Bld: 1374 mg/dL (ref 65–99)
Potassium: 3.8 mmol/L (ref 3.5–5.1)
Sodium: 120 mmol/L — ABNORMAL LOW (ref 135–145)
Total Protein: 6.8 g/dL (ref 6.5–8.1)

## 2014-10-10 LAB — CBG MONITORING, ED

## 2014-10-10 LAB — CBC WITH DIFFERENTIAL/PLATELET
BASOS ABS: 0 10*3/uL (ref 0.0–0.1)
Basophils Relative: 0 % (ref 0–1)
Eosinophils Absolute: 0 10*3/uL (ref 0.0–0.7)
Eosinophils Relative: 0 % (ref 0–5)
HEMATOCRIT: 35.1 % — AB (ref 36.0–46.0)
Hemoglobin: 11.4 g/dL — ABNORMAL LOW (ref 12.0–15.0)
Lymphocytes Relative: 14 % (ref 12–46)
Lymphs Abs: 0.6 10*3/uL — ABNORMAL LOW (ref 0.7–4.0)
MCH: 31.1 pg (ref 26.0–34.0)
MCHC: 32.5 g/dL (ref 30.0–36.0)
MCV: 95.6 fL (ref 78.0–100.0)
Monocytes Absolute: 0.2 10*3/uL (ref 0.1–1.0)
Monocytes Relative: 6 % (ref 3–12)
Neutro Abs: 3.2 10*3/uL (ref 1.7–7.7)
Neutrophils Relative %: 80 % — ABNORMAL HIGH (ref 43–77)
PLATELETS: 128 10*3/uL — AB (ref 150–400)
RBC: 3.67 MIL/uL — AB (ref 3.87–5.11)
RDW: 12.5 % (ref 11.5–15.5)
WBC: 4 10*3/uL (ref 4.0–10.5)

## 2014-10-10 LAB — MRSA PCR SCREENING: MRSA BY PCR: NEGATIVE

## 2014-10-10 LAB — GLUCOSE, CAPILLARY
Glucose-Capillary: >600 mg/dL (ref 65–99)
Glucose-Capillary: >600 mg/dL (ref 65–99)
Glucose-Capillary: >600 mg/dL (ref 65–99)

## 2014-10-10 LAB — LIPASE, BLOOD: LIPASE: 42 U/L (ref 22–51)

## 2014-10-10 LAB — TROPONIN I

## 2014-10-10 MED ORDER — ACETAMINOPHEN 325 MG PO TABS
650.0000 mg | ORAL_TABLET | Freq: Four times a day (QID) | ORAL | Status: DC | PRN
  Filled 2014-10-10: qty 2

## 2014-10-10 MED ORDER — HEPARIN SODIUM (PORCINE) 5000 UNIT/ML IJ SOLN
5000.0000 [IU] | Freq: Three times a day (TID) | INTRAMUSCULAR | Status: DC
  Administered 2014-10-10 – 2014-10-16 (×14): 5000 [IU] via SUBCUTANEOUS
  Filled 2014-10-10 (×19): qty 1

## 2014-10-10 MED ORDER — AMLODIPINE BESYLATE 10 MG PO TABS
10.0000 mg | ORAL_TABLET | Freq: Every day | ORAL | Status: DC
  Filled 2014-10-10: qty 1

## 2014-10-10 MED ORDER — SODIUM CHLORIDE 0.9 % IJ SOLN
3.0000 mL | Freq: Two times a day (BID) | INTRAMUSCULAR | Status: DC
  Administered 2014-10-12 – 2014-10-15 (×6): 3 mL via INTRAVENOUS

## 2014-10-10 MED ORDER — ACETAMINOPHEN 650 MG RE SUPP
650.0000 mg | Freq: Four times a day (QID) | RECTAL | Status: DC | PRN
  Administered 2014-10-11: 650 mg via RECTAL
  Filled 2014-10-10: qty 1

## 2014-10-10 MED ORDER — SODIUM CHLORIDE 0.9 % IV SOLN: 1000.0000 mL | INTRAVENOUS | Status: DC

## 2014-10-10 MED ORDER — SODIUM CHLORIDE 0.9 % IJ SOLN
3.0000 mL | Freq: Two times a day (BID) | INTRAMUSCULAR | Status: DC
  Administered 2014-10-13 – 2014-10-14 (×3): 3 mL via INTRAVENOUS

## 2014-10-10 MED ORDER — SODIUM CHLORIDE 0.9 % IV BOLUS (SEPSIS)
1000.0000 mL | Freq: Once | INTRAVENOUS | Status: AC
  Administered 2014-10-10: 1000 mL via INTRAVENOUS

## 2014-10-10 MED ORDER — ISOSORBIDE MONONITRATE ER 60 MG PO TB24: 120.0000 mg | ORAL_TABLET | Freq: Every day | ORAL | Status: DC

## 2014-10-10 MED ORDER — ALBUTEROL SULFATE (2.5 MG/3ML) 0.083% IN NEBU: 2.5000 mg | INHALATION_SOLUTION | Freq: Four times a day (QID) | RESPIRATORY_TRACT | Status: DC | PRN

## 2014-10-10 MED ORDER — INSULIN ASPART 100 UNIT/ML ~~LOC~~ SOLN
10.0000 [IU] | Freq: Once | SUBCUTANEOUS | Status: AC
  Administered 2014-10-10: 10 [IU] via SUBCUTANEOUS
  Filled 2014-10-10: qty 1

## 2014-10-10 MED ORDER — IOHEXOL 300 MG/ML  SOLN: 25.0000 mL | Freq: Once | INTRAMUSCULAR | Status: AC | PRN

## 2014-10-10 MED ORDER — FERROUS SULFATE 325 (65 FE) MG PO TABS: 325.0000 mg | ORAL_TABLET | ORAL | Status: DC

## 2014-10-10 MED ORDER — CALCITRIOL 0.25 MCG PO CAPS
0.2500 ug | ORAL_CAPSULE | Freq: Every day | ORAL | Status: DC
  Administered 2014-10-12 – 2014-10-14 (×3): 0.25 ug via ORAL
  Filled 2014-10-10 (×6): qty 1

## 2014-10-10 MED ORDER — MORPHINE SULFATE 4 MG/ML IJ SOLN
4.0000 mg | Freq: Once | INTRAMUSCULAR | Status: DC
Start: 2014-10-10 — End: 2014-10-11

## 2014-10-10 MED ORDER — ALBUTEROL SULFATE HFA 108 (90 BASE) MCG/ACT IN AERS: 2.0000 | INHALATION_SPRAY | Freq: Four times a day (QID) | RESPIRATORY_TRACT | Status: DC | PRN

## 2014-10-10 MED ORDER — SODIUM CHLORIDE 0.9 % IV SOLN: 1000.0000 mL | Freq: Once | INTRAVENOUS | Status: DC

## 2014-10-10 MED ORDER — SODIUM CHLORIDE 0.9 % IV SOLN
INTRAVENOUS | Status: DC
  Administered 2014-10-10: 23:00:00 via INTRAVENOUS

## 2014-10-10 MED ORDER — PROMETHAZINE HCL 25 MG PO TABS: 25.0000 mg | ORAL_TABLET | Freq: Two times a day (BID) | ORAL | Status: DC | PRN

## 2014-10-10 MED ORDER — ASPIRIN EC 81 MG PO TBEC
81.0000 mg | DELAYED_RELEASE_TABLET | Freq: Every day | ORAL | Status: DC
  Administered 2014-10-12 – 2014-10-16 (×5): 81 mg via ORAL
  Filled 2014-10-10 (×6): qty 1

## 2014-10-10 MED ORDER — ISOSORBIDE MONONITRATE ER 60 MG PO TB24
120.0000 mg | ORAL_TABLET | Freq: Every day | ORAL | Status: DC
  Filled 2014-10-10: qty 2

## 2014-10-10 MED ORDER — PANTOPRAZOLE SODIUM 40 MG PO TBEC
40.0000 mg | DELAYED_RELEASE_TABLET | Freq: Two times a day (BID) | ORAL | Status: DC
  Administered 2014-10-10 – 2014-10-16 (×10): 40 mg via ORAL
  Filled 2014-10-10 (×8): qty 1

## 2014-10-10 MED ORDER — GABAPENTIN 300 MG PO CAPS
300.0000 mg | ORAL_CAPSULE | Freq: Three times a day (TID) | ORAL | Status: DC
  Administered 2014-10-10 – 2014-10-11 (×2): 300 mg via ORAL
  Filled 2014-10-10 (×7): qty 1

## 2014-10-10 MED ORDER — ONDANSETRON HCL 4 MG/2ML IJ SOLN
4.0000 mg | Freq: Once | INTRAMUSCULAR | Status: AC
  Administered 2014-10-10: 4 mg via INTRAVENOUS
  Filled 2014-10-10: qty 2

## 2014-10-10 MED ORDER — SODIUM CHLORIDE 0.9 % IV SOLN: 250.0000 mL | INTRAVENOUS | Status: DC | PRN

## 2014-10-10 MED ORDER — SODIUM CHLORIDE 0.9 % IV SOLN
INTRAVENOUS | Status: DC
  Administered 2014-10-10: 5.4 [IU]/h via INTRAVENOUS
  Filled 2014-10-10: qty 2.5

## 2014-10-10 MED ORDER — HYDRALAZINE HCL 100 MG PO TABS: 50.0000 mg | ORAL_TABLET | Freq: Three times a day (TID) | ORAL | Status: DC

## 2014-10-10 MED ORDER — HYDRALAZINE HCL 20 MG/ML IJ SOLN
10.0000 mg | Freq: Four times a day (QID) | INTRAMUSCULAR | Status: DC | PRN
  Filled 2014-10-10: qty 1

## 2014-10-10 MED ORDER — DEXTROSE 50 % IV SOLN
25.0000 mL | INTRAVENOUS | Status: DC | PRN
  Administered 2014-10-11: 25 mL via INTRAVENOUS
  Filled 2014-10-10: qty 50

## 2014-10-10 MED ORDER — MORPHINE SULFATE 4 MG/ML IJ SOLN
4.0000 mg | Freq: Once | INTRAMUSCULAR | Status: AC
  Administered 2014-10-10: 4 mg via INTRAVENOUS
  Filled 2014-10-10: qty 1

## 2014-10-10 MED ORDER — SEVELAMER CARBONATE 800 MG PO TABS
800.0000 mg | ORAL_TABLET | Freq: Three times a day (TID) | ORAL | Status: DC
  Administered 2014-10-12 – 2014-10-16 (×10): 800 mg via ORAL
  Filled 2014-10-10 (×19): qty 1

## 2014-10-10 MED ORDER — IOHEXOL 300 MG/ML  SOLN
100.0000 mL | Freq: Once | INTRAMUSCULAR | Status: AC | PRN
  Administered 2014-10-10: 100 mL via INTRAVENOUS

## 2014-10-10 MED ORDER — HYDRALAZINE HCL 50 MG PO TABS
50.0000 mg | ORAL_TABLET | Freq: Three times a day (TID) | ORAL | Status: DC
  Filled 2014-10-10 (×5): qty 1

## 2014-10-10 MED ORDER — HYDROMORPHONE HCL 1 MG/ML IJ SOLN
0.5000 mg | Freq: Once | INTRAMUSCULAR | Status: AC
  Administered 2014-10-10: 0.5 mg via INTRAVENOUS
  Filled 2014-10-10: qty 1

## 2014-10-10 MED ORDER — CLONIDINE HCL 0.1 MG PO TABS
0.1000 mg | ORAL_TABLET | Freq: Two times a day (BID) | ORAL | Status: DC
  Administered 2014-10-10: 0.1 mg via ORAL
  Filled 2014-10-10 (×3): qty 1

## 2014-10-10 MED ORDER — SODIUM CHLORIDE 0.9 % IJ SOLN: 3.0000 mL | INTRAMUSCULAR | Status: DC | PRN

## 2014-10-10 MED ORDER — CARVEDILOL 25 MG PO TABS
25.0000 mg | ORAL_TABLET | Freq: Two times a day (BID) | ORAL | Status: DC
  Filled 2014-10-10 (×3): qty 1

## 2014-10-10 MED ORDER — TRAMADOL HCL 50 MG PO TABS: 50.0000 mg | ORAL_TABLET | Freq: Two times a day (BID) | ORAL | Status: DC | PRN

## 2014-10-10 MED ORDER — NITROGLYCERIN 0.4 MG SL SUBL: 0.4000 mg | SUBLINGUAL_TABLET | SUBLINGUAL | Status: DC | PRN

## 2014-10-10 NOTE — H&P (Signed)
Madeline [name] is an 59 y.o. female.   Chief Complaint: abdominal pain HPI: 59 yo female with dm2, esrd on HD, M, W, F , CHF (EF ?) c/o abdominal pain, diffuse starting while at dialysis. + n/v.  No blood.   Denies fever, chills,  Diarrhea, brbpr, dysuria, hematuria.  Pt was brought to ED for evaluation and found to have bs 1374.  Lipase nl,  CT scan abdomen, pelvis=> showed fullness of the pancreatic head ? Pancreatitis but lipase was wnl.   Pt will be admitted for hyperglycemia, and w/up of abdominal pain.    Past Medical History  Diagnosis Date  . CHF (congestive heart failure)     a. HFpEF with RHF/anasarca/pHTN.  . COPD (chronic obstructive pulmonary disease)   . Coronary artery disease     a. Per [place] records: NSTEMI 01/2012, tx medically given ARF but suspected CAD. b. Stress test 12/16/11 reported w/o ischemia.  . Hypertension   . Diabetes mellitus   . OSA (obstructive sleep apnea)     a. Pt reported used to use CPAP in [place], but "ran out" when came to [facility].  Marland Kitchen CKD (chronic kidney disease), stage III     a. Per [place] records: h/o ARF after CTA that ruled out PE.  Marland Kitchen Pulmonary hypertension     a. [place] 02/28/13: mod pulm HTN with normal PVR suggestive of predominantly pulmonary venous HTN.  . Right heart failure   . Anasarca     a. Per [place] records - due to pulm HTN with R HF.   Marland Kitchen Aseptic meningitis     a. 09/2012: adm in [place] for metabolic encephalopathy, oliguric tubular necrosis, anemia, HTN, possible CVA, HHNKA.  Marland Kitchen CVA (cerebral infarction)     a. Per [place] records, "possible CVA" 09/2012 but MRI reportedly negative.  . Abnormal Doppler ultrasound of carotid artery     a. Per  [place] records: <06% LICA.  Marland Kitchen Renal insufficiency     Past Surgical History  Procedure Laterality Date  . Tubal ligation    . Abdominal hysterectomy    . Esophagogastroduodenoscopy N/A 02/16/2013    Procedure: ESOPHAGOGASTRODUODENOSCOPY (EGD);  Surgeon: Wonda Horner, MD;  Location[facility] ENDOSCOPY;  Service: Endoscopy;  Laterality:  N/A;  . Insertion of dialysis catheter Right 03/03/2013    Procedure: INSERTION OF DIALYSIS CATHETER;  Surgeon: Rosetta Posner, MD;  Location: [place];  Service: Vascular;  Laterality: Right;  Right Internal Jugular Placement  . Av fistula placement Left 03/03/2013    Procedure: ARTERIOVENOUS (AV) FISTULA CREATION, Brachial/Cephalic;  Surgeon: Rosetta Posner, MD;  Location: [place];  Service: Vascular;  Laterality: Left;  . Right heart catheterization N/A 02/28/2013    Procedure: RIGHT HEART CATH;  Surgeon: Jolaine Artist, MD;  Location: [facility] CATH LAB;  Service: Cardiovascular;  Laterality: N/A;  . Shuntogram N/A 07/13/2013    Procedure: FISTULOGRAM;  Surgeon: Conrad Belmont, MD;  [facility] C[facility] LAB;  Service: Cardiovascular;  Laterality: N/A;    Family History  Problem Relation Age of Onset  . Diabetes Mellitus II Sister   . Diabetes Mellitus II Brother   . CAD Brother    Social History:  reports that she has quit smoking. Her smokeless tobacco use includes Snuff and Chew. She reports that she does not drink alcohol or use illicit drugs.  Allergies: No Known Allergies Medications reviewed   Results for orders placed or performed during the hospital encounter of 10/10/14 (from the past 48 hour(s))  CBC  with Differential/Platelet     Status: Abnormal   Collection Time: 10/10/14 12:40 PM  Result Value Ref Range   WBC 4.0 4.0 - 10.5 K/uL   RBC 3.67 (L) 3.87 - 5.11 MIL/uL   Hemoglobin 11.4 (L) 12.0 - 15.0 g/dL   HCT 35.1 (L) 36.0 - 46.0 %   MCV 95.6 78.0 - 100.0 fL   MCH 31.1 26.0 - 34.0 pg   MCHC 32.5 30.0 - 36.0 g/dL   RDW 12.5 11.5 - 15.5 %   Platelets 128 (L) 150 - 400 K/uL   Neutrophils Relative % 80 (H) 43 - 77 %   Neutro Abs 3.2 1.7 - 7.7 K/uL   Lymphocytes Relative 14 12 - 46 %   Lymphs Abs 0.6 (L) 0.7 - 4.0 K/uL   Monocytes Relative 6 3 - 12 %   Monocytes Absolute 0.2 0.1 - 1.0 K/uL   Eosinophils Relative 0 0 - 5 %   Eosinophils Absolute 0.0 0.0 - 0.7 K/uL   Basophils  Relative 0 0 - 1 %   Basophils Absolute 0.0 0.0 - 0.1 K/uL  Comprehensive metabolic panel     Status: Abnormal   Collection Time: 10/10/14 12:40 PM  Result Value Ref Range   Sodium 120 (L) 135 - 145 mmol/L   Potassium 3.8 3.5 - 5.1 mmol/L   Chloride 80 (L) 101 - 111 mmol/L   CO2 25 22 - 32 mmol/L   Glucose, Bld 1374 (HH) 65 - 99 mg/dL    Comment: CRITICAL RESULT CALLED TO, READ BACK BY AND VERIFIED WITH: STEPHENS N RN 10/10/14 1342 COSTELLO B RESULTS CONFIRMED BY MANUAL DILUTION    BUN 10 6 - 20 mg/dL   Creatinine, Ser 3.10 (H) 0.44 - 1.00 mg/dL   Calcium 9.2 8.9 - 10.3 mg/dL   Total Protein 6.8 6.5 - 8.1 g/dL   Albumin 3.5 3.5 - 5.0 g/dL   AST 58 (H) 15 - 41 U/L   ALT 41 14 - 54 U/L   Alkaline Phosphatase 334 (H) 38 - 126 U/L   Total Bilirubin 0.9 0.3 - 1.2 mg/dL   GFR calc non Af Amer 16 (L) >60 mL/min   GFR calc Af Amer 18 (L) >60 mL/min    Comment: (NOTE) The eGFR has been calculated using the CKD EPI equation. This calculation has not been validated in all clinical situations. eGFR's persistently <60 mL/min signify possible Chronic Kidney Disease.    Anion gap 15 5 - 15  Lipase, blood     Status: None   Collection Time: 10/10/14 12:40 PM  Result Value Ref Range   Lipase 42 22 - 51 U/L  Troponin I     Status: None   Collection Time: 10/10/14 12:40 PM  Result Value Ref Range   Troponin I <0.03 <0.031 ng/mL    Comment:        NO INDICATION OF MYOCARDIAL INJURY.   CBG monitoring, ED     Status: Abnormal   Collection Time: 10/10/14 12:50 PM  Result Value Ref Range   Glucose-Capillary >600 (HH) 65 - 99 mg/dL  CBG monitoring, ED     Status: Abnormal   Collection Time: 10/10/14  3:06 PM  Result Value Ref Range   Glucose-Capillary >600 (HH) 65 - 99 mg/dL  CBG monitoring, ED     Status: Abnormal   Collection Time: 10/10/14  6:11 PM  Result Value Ref Range   Glucose-Capillary >600 (HH) 65 - 99 mg/dL   Ct Abdomen Pelvis  W Contrast  10/10/2014   CLINICAL DATA:  Severe  abdominal pain for 1 week. Chronic kidney disease on dialysis.  EXAM: CT ABDOMEN AND PELVIS WITH CONTRAST  TECHNIQUE: Multidetector CT imaging of the abdomen and pelvis was performed using the standard protocol following bolus administration of intravenous contrast.  CONTRAST:  173m OMNIPAQUE IOHEXOL 300 MG/ML  SOLN  COMPARISON:  01/07/2014  FINDINGS: Lower Chest:  Unremarkable.  Hepatobiliary: No masses or other significant abnormality identified. Gallbladder is unremarkable.  Pancreas: Enlargement and heterogeneous attenuation is seen involving the pancreatic head and uncinate process, without evidence of a discrete mass. This is suspicious for mild acute pancreatitis. No evidence of pancreatic necrosis or peripancreatic fluid collections. No evidence of pancreatic ductal dilatation.  Spleen:  Within normal limits in size and appearance.  Adrenals:  No masses identified.  Kidneys/Urinary Tract:  No evidence of masses or hydronephrosis.  Stomach/Bowel/Peritoneum: A small hiatal hernia is seen and there is also wall thickening of the distal esophagus suspicious for reflux esophagitis. No evidence of bowel wall thickening or dilatation. Normal appendix visualized.  Vascular/Lymphatic: No pathologically enlarged lymph nodes identified. No other significant abnormality visualized.  Reproductive: Prior hysterectomy noted. Adnexal regions are unremarkable in appearance.  Other:  None.  Musculoskeletal:  No suspicious bone lesions identified.  IMPRESSION: Swelling of pancreatic head and uncinate process, without evidence of discrete mass or pancreatic ductal dilatation. This is suspicious for focal pancreatitis. Recommend clinical correlation with serum amylase and lipase levels.  Small hiatal hernia, with distal esophageal wall thickening suspicious for reflux esophagitis. Consider endoscopy for further evaluation.   Electronically Signed   By: JEarle GellM.D.   On: 10/10/2014 17:23    Review of Systems   Constitutional: Negative.   HENT: Negative.   Eyes: Negative.   Respiratory: Negative.   Cardiovascular: Negative.   Gastrointestinal: Positive for nausea, vomiting and abdominal pain. Negative for heartburn, diarrhea, constipation, blood in stool and melena.  Genitourinary: Negative.   Musculoskeletal: Negative.   Skin: Negative.   Neurological: Negative.   Endo/Heme/Allergies: Negative.   Psychiatric/Behavioral: Negative.     Blood pressure 178/77, pulse 65, temperature 98.6 F (37 C), resp. rate 19, height 4' 9"  (1.448 m), weight 60.328 kg (133 lb), SpO2 96 %. Physical Exam  Constitutional: She is oriented to person, place, and time. She appears well-developed and well-nourished.  HENT:  Head: Normocephalic and atraumatic.  Eyes: Conjunctivae and EOM are normal. Pupils are equal, round, and reactive to light. No scleral icterus.  Neck: Normal range of motion. Neck supple. No JVD present. No tracheal deviation present. No thyromegaly present.  Cardiovascular: Normal rate and regular rhythm.  Exam reveals no gallop and no friction rub.   No murmur heard. Respiratory: Effort normal and breath sounds normal. No respiratory distress. She has no wheezes. She has no rales.  GI: Soft. Bowel sounds are normal. She exhibits no distension. There is no tenderness. There is no rebound and no guarding.  obese  Musculoskeletal: Normal range of motion. She exhibits no edema or tenderness.  Lymphadenopathy:    She has no cervical adenopathy.  Neurological: She is alert and oriented to person, place, and time. She has normal reflexes. She displays normal reflexes. No cranial nerve deficit. She exhibits normal muscle tone. Coordination normal.  Skin: Skin is warm and dry. No rash noted. No erythema. No pallor.  Psychiatric: She has a normal mood and affect. Her behavior is normal. Judgment and thought content normal.     Assessment/Plan  Hyperglycemia Ns iv gently Iv insulin fsbs q1h  initially  ESRD on HD, M, W, F Nephrology contacted, Dr. Augustin Coupe Appreciate their input  Abdominal discomfort,  Unclear source, NPO except for medications Last bm w/in past 48 hours per family. Normal Continue to monitor clinically.   Cont protonix Check ua   Dm2 Iv insulin for now  Seizure do Cont current medications  DVT prophylaxis, scd, heparin  Jani Gravel 10/10/2014, 6:35 PM

## 2014-10-10 NOTE — ED Notes (Signed)
Pt.bloodsugar is over 600 mg. Report to nurse NICOLE RN

## 2014-10-10 NOTE — ED Notes (Signed)
Pt arrives from dialysis via [facility]. Pt states she was at dialysis and before she could start her tx she began having severe abdominal pain. Pt states she has been having problems with abd pain x 1 week intermittently.Pt receives dialysis MWF. Pt states her abdominal pain radiates up to her chest when she moves. Pt received 4 zofran, 100 ml NS and 324 of aspirin pta. Pt has a hx of angina, and has not had any nitro today rt dry heaving. Pt is a&o x4.

## 2014-10-10 NOTE — ED Provider Notes (Signed)
Patient seen/examined in the Emergency Department in conjunction with Midlevel Provider  Patient reports abdominal pain Exam : awake/alert, diffuse abdominal tenderness Plan: pt is hyperglycemic without anion gap Will need CT imaging for abdominal pain    Zadie Rhineonald Preslynn Bier, MD 10/10/14 1339

## 2014-10-10 NOTE — ED Notes (Signed)
Glucose stabilizer not to be started until CT scan resulted.

## 2014-10-10 NOTE — ED Notes (Signed)
Attempted to call report

## 2014-10-10 NOTE — ED Provider Notes (Signed)
CSN: [id number]     Arrival date & time 10/10/14  1143 History   First MD Initiated Contact with Patient 10/10/14 1155     Chief Complaint  Patient presents with  . Abdominal Pain     (Consider location/radiation/quality/duration/timing/severity/associated sxs/prior Treatment) HPI Comments: Patient with past medical history of COPD, CAD, hypertension, diabetes, CK-MB, CHF presents to the ED from dialysis with chief complaint of abdominal pain. She states the pain became severe this morning. She has had some intermittent symptoms 1 week. She is on a Monday Wednesday Friday dialysis schedule. She did not complete dialysis today. She states the pain is mostly located in the abdomen. She denies any associated vomiting or diarrhea. She has had some nausea. Symptoms are aggravated with palpation. She denies any fevers chills. She has not taken anything to alleviate the symptoms.  The history is provided by the patient. No language interpreter was used.    Past Medical History  Diagnosis Date  . CHF (congestive heart failure)     a. HFpEF with RHF/anasarca/pHTN.  . COPD (chronic obstructive pulmonary disease)   . Coronary artery disease     a. Per [place] records: NSTEMI 01/2012, tx medically given ARF but suspected CAD. b. Stress test 12/16/11 reported w/o ischemia.  . Hypertension   . Diabetes mellitus   . OSA (obstructive sleep apnea)     a. Pt reported used to use CPAP in [place], but "ran out" when came to [facility].  Marland Kitchen CKD (chronic kidney disease), stage III     a. Per [place] records: h/o ARF after CTA that ruled out PE.  Marland Kitchen Pulmonary hypertension     a. RHC 02/28/13: mod pulm HTN with normal PVR suggestive of predominantly pulmonary venous HTN.  . Right heart failure   . Anasarca     a. Per [place] records - due to pulm HTN with R HF.   Marland Kitchen Aseptic meningitis     a. 09/2012: adm in [place] for metabolic encephalopathy, oliguric tubular necrosis, anemia, HTN, possible CVA, HHNKA.  Marland Kitchen CVA (cerebral infarction)     a. Per  [place] records, "possible CVA" 09/2012 but MRI reportedly negative.  . Abnormal Doppler ultrasound of carotid artery     a. Per [place] records: <50% LICA.  Marland Kitchen Renal insufficiency    Past Surgical History  Procedure Laterality Date  . Tubal ligation    . Abdominal hysterectomy    . Esophagogastroduodenoscopy N/A 02/16/2013    Procedure: ESOPHAGOGASTRODUODENOSCOPY (EGD);  Surgeon: Graylin Shiver, MD;  Location: [facility] ENDOSCOPY;  Service: Endoscopy;  Laterality: N/A;  . Insertion of dialysis catheter Right 03/03/2013    Procedure: INSERTION OF DIALYSIS CATHETER;  Surgeon: Larina Earthly, MD;  Location: [facility] OR;  Service: Vascular;  Laterality: Right;  Right Internal Jugular Placement  . Av fistula placement Left 03/03/2013    Procedure: ARTERIOVENOUS (AV) FISTULA CREATION, Brachial/Cephalic;  Surgeon: Larina Earthly, MD;  Location: [facility] OR;  Service: Vascular;  Laterality: Left;  . Right heart catheterization N/A 02/28/2013    Procedure: RIGHT HEART CATH;  Surgeon: Dolores Patty, MD;  Location: [facility] CATH LAB;  Service: Cardiovascular;  Laterality: N/A;  . Shuntogram N/A 07/13/2013    Procedure: FISTULOGRAM;  Surgeon: Fransisco Hertz, MD;  Location: [facility] CATH LAB;  Service: Cardiovascular;  Laterality: N/A;   Family History  Problem Relation Age of Onset  . Diabetes Mellitus II Sister   . Diabetes Mellitus II Brother   . CAD Brother    History  Substance Use Topics  . Smoking status: Former Games developermoker  . Smokeless tobacco: Current User    Types: Snuff, Chew  . Alcohol Use: No   OB History    No data available     Review of Systems  Constitutional: Negative for fever and chills.  Respiratory: Negative for shortness of breath.   Cardiovascular: Negative for chest pain.  Gastrointestinal: Positive for abdominal pain. Negative for nausea, vomiting, diarrhea and constipation.  Genitourinary: Negative for dysuria.  All other systems reviewed and are negative.     Allergies  Review of patient's allergies  indicates no known allergies.  Home Medications   Prior to Admission medications   Medication Sig Start Date End Date Taking? Authorizing Provider  acetaminophen (TYLENOL) 500 MG tablet Take 500-1,000 mg by mouth every 6 (six) hours as needed for moderate pain.    Historical Provider, MD  albuterol (PROVENTIL HFA;VENTOLIN HFA) 108 (90 BASE) MCG/ACT inhaler Inhale 2 puffs into the lungs every 6 (six) hours as needed for wheezing or shortness of breath.     Historical Provider, MD  amLODipine (NORVASC) 10 MG tablet Take 10 mg by mouth daily.     Historical Provider, MD  aspirin EC 81 MG tablet Take 81 mg by mouth daily.    Historical Provider, MD  calcitRIOL (ROCALTROL) 0.25 MCG capsule Take 0.25 mcg by mouth daily.    Historical Provider, MD  calcium acetate (PHOSLO) 667 MG capsule Take 667 mg by mouth 3 (three) times daily with meals.    Historical Provider, MD  carvedilol (COREG) 25 MG tablet Take 25 mg by mouth 2 (two) times daily with a meal.    Historical Provider, MD  ferrous sulfate 325 (65 FE) MG tablet Take 325 mg by mouth every Monday, Wednesday, and Friday with hemodialysis.     Historical Provider, MD  gabapentin (NEURONTIN) 300 MG capsule Take 300 mg by mouth 2 (two) times daily.    Historical Provider, MD  insulin glargine (LANTUS) 100 UNIT/ML injection Inject 0.08 mLs (8 Units total) into the skin at bedtime. 02/09/14   Penny Piarlando Vega, MD  insulin lispro (HUMALOG) 100 UNIT/ML injection Inject 3-10 Units into the skin 3 (three) times daily before meals. Sliding scale.  CBG 200: 3 UNITS, 250 5 UNITS, 300 7 UNITS, 350 9-10 UNITS    Historical Provider, MD  isosorbide mononitrate (IMDUR) 120 MG 24 hr tablet Take 120 mg by mouth daily.    Historical Provider, MD  nitroGLYCERIN (NITROSTAT) 0.4 MG SL tablet Place 1 tablet (0.4 mg total) under the tongue every 5 (five) minutes as needed for chest pain. 07/05/14   Dolores Pattyaniel R Bensimhon, MD  pantoprazole (PROTONIX) 40 MG tablet Take 1 tablet (40 mg  total) by mouth 2 (two) times daily. 06/05/13   Coolidge Breezeachel E Chikowski, MD  promethazine (PHENERGAN) 25 MG tablet Take 25 mg by mouth 2 (two) times daily as needed for nausea or vomiting.     Historical Provider, MD  sevelamer carbonate (RENVELA) 800 MG tablet Take 800 mg by mouth 3 (three) times daily with meals.    Historical Provider, MD  traMADol (ULTRAM) 50 MG tablet Take 50 mg by mouth every 12 (twelve) hours as needed for severe pain.     Historical Provider, MD   BP 185/78 mmHg  Pulse 70  Temp(Src) 98.6 F (37 C)  Resp 16  Ht 4\' 9"  (1.448 m)  Wt 133 lb (60.328 kg)  BMI 28.77 kg/m2  SpO2 97% Physical Exam  Constitutional:  She is oriented to person, place, and time. She appears well-developed and well-nourished.  HENT:  Head: Normocephalic and atraumatic.  Eyes: Conjunctivae and EOM are normal. Pupils are equal, round, and reactive to light.  Neck: Normal range of motion. Neck supple.  Cardiovascular: Normal rate and regular rhythm.  Exam reveals no gallop and no friction rub.   No murmur heard. Pulmonary/Chest: Effort normal and breath sounds normal. No respiratory distress. She has no wheezes. She has no rales. She exhibits no tenderness.  Abdominal: Soft. Bowel sounds are normal. She exhibits no distension and no mass. There is tenderness. There is no rebound and no guarding.  Abdomen is diffusely tender to palpation  Musculoskeletal: Normal range of motion. She exhibits no edema or tenderness.  Neurological: She is alert and oriented to person, place, and time.  Skin: Skin is warm and dry.  Psychiatric: She has a normal mood and affect. Her behavior is normal. Judgment and thought content normal.  Nursing note and vitals reviewed.   ED Course  Procedures (including critical care time) Labs Review Labs Reviewed  CBC WITH DIFFERENTIAL/PLATELET - Abnormal; Notable for the following:    RBC 3.67 (*)    Hemoglobin 11.4 (*)    HCT 35.1 (*)    Platelets 128 (*)    Neutrophils  Relative % 80 (*)    Lymphs Abs 0.6 (*)    All other components within normal limits  COMPREHENSIVE METABOLIC PANEL - Abnormal; Notable for the following:    Sodium 120 (*)    Chloride 80 (*)    Glucose, Bld 1374 (*)    Creatinine, Ser 3.10 (*)    AST 58 (*)    Alkaline Phosphatase 334 (*)    GFR calc non Af Amer 16 (*)    GFR calc Af Amer 18 (*)    All other components within normal limits  CBG MONITORING, ED - Abnormal; Notable for the following:    Glucose-Capillary >600 (*)    All other components within normal limits  LIPASE, BLOOD  TROPONIN I    Imaging Review No results found.   EKG Interpretation   Date/Time:  Wednesday October 10 2014 11:51:35 EDT Ventricular Rate:  71 PR Interval:  167 QRS Duration: 94 QT Interval:  425 QTC Calculation: 462 R Axis:   36 Text Interpretation:  Sinus rhythm Atrial premature complex Probable left  atrial enlargement Borderline T wave abnormalities Abnormal ekg changes  from prior Confirmed by Bebe Shaggy  MD, DONALD ([id number]) on 10/10/2014 12:00:42  PM      MDM   Final diagnoses:  Abdominal pain  Abdominal pain    Patient with abdominal pain.  Sent to ED from HD.  Abdomen ttp, will treat pain and check labs and CT.  Patient does not make urine.    Patient seen by and discussed with Dr. Bebe Shaggy, who agrees with plan for CT.  Recommends giving 10 units SQ insulin prior to CT and then starting glucostabilizer after CT.   Patient signed out to [place], PA-C, who will continue care.  Plan:  CT abd. to look for acute cause of abdominal pain.  Patient will need to be admitted regardless of CT results for hyperglycemia.    CRITICAL CARE Performed by: Roxy Horseman   Total critical care time: 45  Critical care time was exclusive of separately billable procedures and treating other patients.  Critical care was necessary to treat or prevent imminent or life-threatening deterioration.  Critical care was time spent personally  by me on the following activities: development of treatment plan with patient and/or surrogate as well as nursing, discussions with consultants, evaluation of patient's response to treatment, examination of patient, obtaining history from patient or surrogate, ordering and performing treatments and interventions, ordering and review of laboratory studies, ordering and review of radiographic studies, pulse oximetry and re-evaluation of patient's condition.   Roxy Horseman, PA-C 10/10/14 1613  Zadie Rhine, MD 10/10/14 [phone]

## 2014-10-10 NOTE — ED Notes (Signed)
Phlebotomy at bedside.

## 2014-10-10 NOTE — ED Notes (Signed)
Checked pts BS. Read HI

## 2014-10-10 NOTE — ED Provider Notes (Signed)
PROGRESS NOTE                                                                                                                 This is a sign-out from PA LimaBrowning at shift change: Madeline [name] is a 59 y.o. female presenting with hyperglycemia and abdominal pain. Patient's blood sugars found to be greater than 1300, patient is a normal anion gap. Plan is to follow-up CT and admit for severe hyperglycemia. Please refer to previous note for full HPI, ROS, PMH and PE.   Patient seen and evaluated the bedside, she is resting comfortably watching TV. Her abdominal pain is 4 out of 10, request more pain medication. Mild, diffuse tenderness to palpation with no guarding or rebound.  Murphy sign negative, no tenderness to palpation over McBurney's point, Rovsings, Psoas and obturator all negative.Heart is regular rate and rhythm with no murmurs rubs or gallops, lung sounds are clear to auscultation bilaterally. Fistula to left antecubital area.   Patient states that her home blood sugars have intermittently been reading "High" states that she is compliant with her insulin and metformin.  Patient will be started on glucose stabilizer, her corrected sodium is 140. CT abdomen pelvis shows a swelling of the pancreatic head and indicate process, suspicious for focal pancreatitis however she has a normal lipase level. I think that her abdominal pain is likely secondary to her severe hyperglycemia. However patient will be made nothing by mouth out of an abundance of caution. Unassigned admission to [facility] hospitalist Dr. Selena BattenKim.      Madeline Emeryicole Alayzia Pavlock, PA-C 10/10/14 1806  Zadie Rhineonald Wickline, MD 10/11/14 [phone]

## 2014-10-10 NOTE — ED Notes (Signed)
CBG monitor reading "HIGH" 

## 2014-10-10 NOTE — ED Notes (Addendum)
Dr. Kim at bedside   

## 2014-10-11 ENCOUNTER — Inpatient Hospital Stay (HOSPITAL_COMMUNITY): Payer: Medicaid Other

## 2014-10-11 DIAGNOSIS — R509 Fever, unspecified: Secondary | ICD-10-CM | POA: Insufficient documentation

## 2014-10-11 DIAGNOSIS — R6521 Severe sepsis with septic shock: Secondary | ICD-10-CM

## 2014-10-11 DIAGNOSIS — Z452 Encounter for adjustment and management of vascular access device: Secondary | ICD-10-CM

## 2014-10-11 DIAGNOSIS — R109 Unspecified abdominal pain: Secondary | ICD-10-CM | POA: Insufficient documentation

## 2014-10-11 DIAGNOSIS — R1010 Upper abdominal pain, unspecified: Secondary | ICD-10-CM

## 2014-10-11 DIAGNOSIS — A419 Sepsis, unspecified organism: Secondary | ICD-10-CM

## 2014-10-11 DIAGNOSIS — R739 Hyperglycemia, unspecified: Secondary | ICD-10-CM

## 2014-10-11 LAB — URINALYSIS, ROUTINE W REFLEX MICROSCOPIC
BILIRUBIN URINE: NEGATIVE
Glucose, UA: >1000 mg/dL — AB
HGB URINE DIPSTICK: NEGATIVE
KETONES UR: NEGATIVE mg/dL
Leukocytes, UA: NEGATIVE
NITRITE: NEGATIVE
Protein, ur: 100 mg/dL — AB
SPECIFIC GRAVITY, URINE: 1.03 (ref 1.005–1.030)
UROBILINOGEN UA: 0.2 mg/dL (ref 0.0–1.0)
pH: 6.5 (ref 5.0–8.0)

## 2014-10-11 LAB — POCT I-STAT 3, ART BLOOD GAS (G3+)
Acid-base deficit: 1 mmol/L (ref 0.0–2.0)
BICARBONATE: 24.3 meq/L — AB (ref 20.0–24.0)
O2 SAT: 92 %
TCO2: 26 mmol/L (ref 0–100)
pCO2 arterial: 44.3 mmHg (ref 35.0–45.0)
pH, Arterial: 7.349 — ABNORMAL LOW (ref 7.350–7.450)
pO2, Arterial: 67 mmHg — ABNORMAL LOW (ref 80.0–100.0)

## 2014-10-11 LAB — CBC
HCT: 33.1 % — ABNORMAL LOW (ref 36.0–46.0)
HCT: 28 % — ABNORMAL LOW (ref 36.0–46.0)
Hemoglobin: 12 g/dL (ref 12.0–15.0)
Hemoglobin: 10 g/dL — ABNORMAL LOW (ref 12.0–15.0)
MCH: 32 pg (ref 26.0–34.0)
MCH: 31.7 pg (ref 26.0–34.0)
MCHC: 36.3 g/dL — AB (ref 30.0–36.0)
MCHC: 35.7 g/dL (ref 30.0–36.0)
MCV: 88.3 fL (ref 78.0–100.0)
MCV: 88.9 fL (ref 78.0–100.0)
PLATELETS: 169 10*3/uL (ref 150–400)
PLATELETS: 127 10*3/uL — AB (ref 150–400)
RBC: 3.75 MIL/uL — AB (ref 3.87–5.11)
RBC: 3.15 MIL/uL — AB (ref 3.87–5.11)
RDW: 12 % (ref 11.5–15.5)
RDW: 12.2 % (ref 11.5–15.5)
WBC: 14 10*3/uL — ABNORMAL HIGH (ref 4.0–10.5)
WBC: 11.2 10*3/uL — AB (ref 4.0–10.5)

## 2014-10-11 LAB — HEPATIC FUNCTION PANEL
ALT: 28 U/L (ref 14–54)
AST: 53 U/L — ABNORMAL HIGH (ref 15–41)
Albumin: 2.1 g/dL — ABNORMAL LOW (ref 3.5–5.0)
Alkaline Phosphatase: 165 U/L — ABNORMAL HIGH (ref 38–126)
BILIRUBIN INDIRECT: 0.2 mg/dL — AB (ref 0.3–0.9)
Bilirubin, Direct: 0.2 mg/dL (ref 0.1–0.5)
TOTAL PROTEIN: 4.2 g/dL — AB (ref 6.5–8.1)
Total Bilirubin: 0.4 mg/dL (ref 0.3–1.2)

## 2014-10-11 LAB — CARBOXYHEMOGLOBIN
Carboxyhemoglobin: 2 % — ABNORMAL HIGH (ref 0.5–1.5)
METHEMOGLOBIN: 1.2 % (ref 0.0–1.5)
O2 Saturation: 78.1 %
TOTAL HEMOGLOBIN: 8.6 g/dL — AB (ref 12.0–16.0)

## 2014-10-11 LAB — LACTIC ACID, PLASMA
LACTIC ACID, VENOUS: 2.7 mmol/L — AB (ref 0.5–2.0)
Lactic Acid, Venous: 1.8 mmol/L (ref 0.5–2.0)

## 2014-10-11 LAB — RENAL FUNCTION PANEL
ANION GAP: 9 (ref 5–15)
Albumin: 2.1 g/dL — ABNORMAL LOW (ref 3.5–5.0)
BUN: 14 mg/dL (ref 6–20)
CALCIUM: 8.1 mg/dL — AB (ref 8.9–10.3)
CHLORIDE: 102 mmol/L (ref 101–111)
CO2: 24 mmol/L (ref 22–32)
CREATININE: 3.38 mg/dL — AB (ref 0.44–1.00)
GFR calc non Af Amer: 14 mL/min — ABNORMAL LOW (ref >60)
GFR, EST AFRICAN AMERICAN: 16 mL/min — AB (ref >60)
Glucose, Bld: 117 mg/dL — ABNORMAL HIGH (ref 65–99)
Phosphorus: 2.3 mg/dL — ABNORMAL LOW (ref 2.5–4.6)
Potassium: 3.3 mmol/L — ABNORMAL LOW (ref 3.5–5.1)
Sodium: 135 mmol/L (ref 135–145)

## 2014-10-11 LAB — COMPREHENSIVE METABOLIC PANEL
ALK PHOS: 222 U/L — AB (ref 38–126)
ALT: 34 U/L (ref 14–54)
AST: 52 U/L — AB (ref 15–41)
Albumin: 2.7 g/dL — ABNORMAL LOW (ref 3.5–5.0)
Anion gap: 12 (ref 5–15)
BUN: 13 mg/dL (ref 6–20)
CALCIUM: 9.1 mg/dL (ref 8.9–10.3)
CO2: 24 mmol/L (ref 22–32)
Chloride: 96 mmol/L — ABNORMAL LOW (ref 101–111)
Creatinine, Ser: 3.17 mg/dL — ABNORMAL HIGH (ref 0.44–1.00)
GFR calc Af Amer: 18 mL/min — ABNORMAL LOW (ref >60)
GFR, EST NON AFRICAN AMERICAN: 15 mL/min — AB (ref >60)
Glucose, Bld: 79 mg/dL (ref 65–99)
Potassium: 3.2 mmol/L — ABNORMAL LOW (ref 3.5–5.1)
SODIUM: 132 mmol/L — AB (ref 135–145)
Total Bilirubin: 0.4 mg/dL (ref 0.3–1.2)
Total Protein: 5.5 g/dL — ABNORMAL LOW (ref 6.5–8.1)

## 2014-10-11 LAB — URINE MICROSCOPIC-ADD ON

## 2014-10-11 LAB — GLUCOSE, CAPILLARY
GLUCOSE-CAPILLARY: 119 mg/dL — AB (ref 65–99)
GLUCOSE-CAPILLARY: 145 mg/dL — AB (ref 65–99)
GLUCOSE-CAPILLARY: 148 mg/dL — AB (ref 65–99)
GLUCOSE-CAPILLARY: 164 mg/dL — AB (ref 65–99)
GLUCOSE-CAPILLARY: 76 mg/dL (ref 65–99)
Glucose-Capillary: 111 mg/dL — ABNORMAL HIGH (ref 65–99)
Glucose-Capillary: 118 mg/dL — ABNORMAL HIGH (ref 65–99)
Glucose-Capillary: 137 mg/dL — ABNORMAL HIGH (ref 65–99)
Glucose-Capillary: 138 mg/dL — ABNORMAL HIGH (ref 65–99)
Glucose-Capillary: 158 mg/dL — ABNORMAL HIGH (ref 65–99)
Glucose-Capillary: 336 mg/dL — ABNORMAL HIGH (ref 65–99)
Glucose-Capillary: 396 mg/dL — ABNORMAL HIGH (ref 65–99)
Glucose-Capillary: 566 mg/dL (ref 65–99)
Glucose-Capillary: 70 mg/dL (ref 65–99)

## 2014-10-11 LAB — RAPID URINE DRUG SCREEN, HOSP PERFORMED
AMPHETAMINES: NOT DETECTED
Barbiturates: NOT DETECTED
Benzodiazepines: NOT DETECTED
Cocaine: NOT DETECTED
Opiates: POSITIVE — AB
TETRAHYDROCANNABINOL: NOT DETECTED

## 2014-10-11 LAB — APTT: aPTT: 25 seconds (ref 24–37)

## 2014-10-11 LAB — CORTISOL: Cortisol, Plasma: 19.8 ug/dL

## 2014-10-11 LAB — STREP PNEUMONIAE URINARY ANTIGEN: Strep Pneumo Urinary Antigen: NEGATIVE

## 2014-10-11 LAB — PROTIME-INR
INR: 1.28 (ref 0.00–1.49)
Prothrombin Time: 16.1 seconds — ABNORMAL HIGH (ref 11.6–15.2)

## 2014-10-11 LAB — PROCALCITONIN: Procalcitonin: 0.79 ng/mL

## 2014-10-11 MED ORDER — SODIUM CHLORIDE 0.9 % IV BOLUS (SEPSIS)
500.0000 mL | Freq: Once | INTRAVENOUS | Status: AC
  Administered 2014-10-11: 500 mL via INTRAVENOUS

## 2014-10-11 MED ORDER — INSULIN ASPART 100 UNIT/ML ~~LOC~~ SOLN
0.0000 [IU] | SUBCUTANEOUS | Status: DC
  Administered 2014-10-11 (×2): 1 [IU] via SUBCUTANEOUS
  Administered 2014-10-11: 2 [IU] via SUBCUTANEOUS
  Administered 2014-10-11: 1 [IU] via SUBCUTANEOUS
  Administered 2014-10-12 (×2): 2 [IU] via SUBCUTANEOUS
  Administered 2014-10-13: 3 [IU] via SUBCUTANEOUS
  Administered 2014-10-13: 2 [IU] via SUBCUTANEOUS
  Administered 2014-10-13: 7 [IU] via SUBCUTANEOUS

## 2014-10-11 MED ORDER — SODIUM CHLORIDE 0.9 % IV BOLUS (SEPSIS)
950.0000 mL | Freq: Once | INTRAVENOUS | Status: AC
  Administered 2014-10-11: 950 mL via INTRAVENOUS

## 2014-10-11 MED ORDER — SODIUM CHLORIDE 0.9 % IV SOLN: 100.0000 mL | INTRAVENOUS | Status: DC | PRN

## 2014-10-11 MED ORDER — VANCOMYCIN HCL 10 G IV SOLR
1250.0000 mg | Freq: Once | INTRAVENOUS | Status: AC
  Administered 2014-10-11: 1250 mg via INTRAVENOUS
  Filled 2014-10-11: qty 1250

## 2014-10-11 MED ORDER — HEPARIN SODIUM (PORCINE) 1000 UNIT/ML DIALYSIS
1000.0000 [IU] | INTRAMUSCULAR | Status: DC | PRN
  Filled 2014-10-11: qty 1

## 2014-10-11 MED ORDER — NOREPINEPHRINE BITARTRATE 1 MG/ML IV SOLN
0.0000 ug/min | INTRAVENOUS | Status: DC
  Filled 2014-10-11: qty 4

## 2014-10-11 MED ORDER — LIDOCAINE-PRILOCAINE 2.5-2.5 % EX CREA
1.0000 "application " | TOPICAL_CREAM | CUTANEOUS | Status: DC | PRN
  Filled 2014-10-11: qty 5

## 2014-10-11 MED ORDER — NALOXONE HCL 0.4 MG/ML IJ SOLN
0.4000 mg | INTRAMUSCULAR | Status: DC | PRN
  Filled 2014-10-11: qty 1

## 2014-10-11 MED ORDER — WHITE PETROLATUM GEL
Status: AC
  Administered 2014-10-11: 0.2
  Filled 2014-10-11: qty 1

## 2014-10-11 MED ORDER — INSULIN ASPART 100 UNIT/ML ~~LOC~~ SOLN: 0.0000 [IU] | Freq: Three times a day (TID) | SUBCUTANEOUS | Status: DC

## 2014-10-11 MED ORDER — INSULIN GLARGINE 100 UNIT/ML ~~LOC~~ SOLN
3.0000 [IU] | Freq: Once | SUBCUTANEOUS | Status: AC
  Administered 2014-10-11: 3 [IU] via SUBCUTANEOUS
  Filled 2014-10-11: qty 0.03

## 2014-10-11 MED ORDER — NEPRO/CARBSTEADY PO LIQD
237.0000 mL | ORAL | Status: DC | PRN
  Filled 2014-10-11: qty 237

## 2014-10-11 MED ORDER — NOREPINEPHRINE BITARTRATE 1 MG/ML IV SOLN
5.0000 ug/min | INTRAVENOUS | Status: DC
  Filled 2014-10-11: qty 4

## 2014-10-11 MED ORDER — SODIUM CHLORIDE 0.9 % IV SOLN
250.0000 mg | Freq: Two times a day (BID) | INTRAVENOUS | Status: DC
  Administered 2014-10-11 – 2014-10-13 (×5): 250 mg via INTRAVENOUS
  Filled 2014-10-11 (×7): qty 250

## 2014-10-11 MED ORDER — CALCITRIOL 0.5 MCG PO CAPS
1.2500 ug | ORAL_CAPSULE | ORAL | Status: DC
  Administered 2014-10-15: 1.25 ug via ORAL
  Filled 2014-10-11: qty 1

## 2014-10-11 MED ORDER — INSULIN GLARGINE 100 UNIT/ML ~~LOC~~ SOLN: 10.0000 [IU] | Freq: Every day | SUBCUTANEOUS | Status: DC

## 2014-10-11 MED ORDER — PIPERACILLIN-TAZOBACTAM 3.375 G IVPB 30 MIN
3.3750 g | Freq: Once | INTRAVENOUS | Status: DC
Start: 2014-10-11 — End: 2014-10-11

## 2014-10-11 MED ORDER — ALTEPLASE 2 MG IJ SOLR
2.0000 mg | Freq: Once | INTRAMUSCULAR | Status: DC | PRN
  Filled 2014-10-11: qty 2

## 2014-10-11 MED ORDER — CETYLPYRIDINIUM CHLORIDE 0.05 % MT LIQD
7.0000 mL | Freq: Two times a day (BID) | OROMUCOSAL | Status: DC
  Administered 2014-10-11 – 2014-10-15 (×9): 7 mL via OROMUCOSAL

## 2014-10-11 MED ORDER — HEPARIN SODIUM (PORCINE) 1000 UNIT/ML DIALYSIS
1800.0000 [IU] | Freq: Once | INTRAMUSCULAR | Status: DC
  Filled 2014-10-11: qty 2

## 2014-10-11 MED ORDER — NALOXONE HCL 0.4 MG/ML IJ SOLN
0.4000 mg | Freq: Once | INTRAMUSCULAR | Status: AC
  Administered 2014-10-11: 0.4 mg via INTRAVENOUS
  Filled 2014-10-11: qty 1

## 2014-10-11 MED ORDER — LIDOCAINE HCL (PF) 1 % IJ SOLN: 5.0000 mL | INTRAMUSCULAR | Status: DC | PRN

## 2014-10-11 MED ORDER — SODIUM CHLORIDE 0.9 % IV SOLN
INTRAVENOUS | Status: DC
  Administered 2014-10-11: 14:00:00 via INTRAVENOUS

## 2014-10-11 MED ORDER — VANCOMYCIN HCL 500 MG IV SOLR
500.0000 mg | INTRAVENOUS | Status: DC
  Filled 2014-10-11: qty 500

## 2014-10-11 MED ORDER — PIPERACILLIN-TAZOBACTAM IN DEX 2-0.25 GM/50ML IV SOLN
2.2500 g | Freq: Three times a day (TID) | INTRAVENOUS | Status: DC
  Filled 2014-10-11 (×3): qty 50

## 2014-10-11 MED ORDER — CALCITRIOL 0.5 MCG PO CAPS
1.2500 ug | ORAL_CAPSULE | ORAL | Status: DC
  Administered 2014-10-13: 1.25 ug via ORAL
  Filled 2014-10-11 (×3): qty 1

## 2014-10-11 MED ORDER — INSULIN ASPART 100 UNIT/ML ~~LOC~~ SOLN: 0.0000 [IU] | Freq: Every day | SUBCUTANEOUS | Status: DC

## 2014-10-11 MED ORDER — VASOPRESSIN 20 UNIT/ML IV SOLN
0.0300 [IU]/min | INTRAVENOUS | Status: DC
  Filled 2014-10-11: qty 2

## 2014-10-11 MED ORDER — RENA-VITE PO TABS
1.0000 | ORAL_TABLET | Freq: Every day | ORAL | Status: DC
  Administered 2014-10-11 – 2014-10-14 (×4): 1 via ORAL
  Filled 2014-10-11 (×7): qty 1

## 2014-10-11 MED ORDER — PENTAFLUOROPROP-TETRAFLUOROETH EX AERO: 1.0000 "application " | INHALATION_SPRAY | CUTANEOUS | Status: DC | PRN

## 2014-10-11 MED ORDER — SODIUM CHLORIDE 0.9 % IV BOLUS (SEPSIS): 500.0000 mL | Freq: Once | INTRAVENOUS | Status: AC

## 2014-10-11 NOTE — Progress Notes (Addendum)
Pt noted to be very lethargic around febrile with morning vitals. Treated fever per orders also rechecked CBG=70 ([facility] sBP in the 80s. MD o/c Craige Cotta(Kirby) notified or changes, orders received. Will implement and continue monitoring. 0730 BP continues to drop(sBP=70s) after 500cc bolus, cbg =118 at recheck, unresponsive to painful stimuli. MD at bedside, reported off to dayshift RN

## 2014-10-11 NOTE — Procedures (Signed)
Central Venous Catheter Insertion Procedure Note Madeline [name] [id number] Dec 18, 1955  Procedure: Insertion of Central Venous Catheter Indications: Assessment of intravascular volume, Drug and/or fluid administration and Frequent blood sampling  Procedure Details Consent: Risks of procedure as well as the alternatives and risks of each were explained to the (patient/caregiver).  Consent for procedure obtained. Time Out: Verified patient identification, verified procedure, site/side was marked, verified correct patient position, special equipment/implants available, medications/allergies/relevent history reviewed, required imaging and test results available.  Performed Real time US was used to ID and cannulate the vessel  Maximum sterile technique was used including antiseptics, cap, gloves, gown, hand hygiene, mask and sheet. Skin prep: Chlorhexidine; local anesthetic administered A antimicrobial bonded/coated triple lumen catheter was placed in the right internal jugular vein using the Seldinger technique.  Evaluation Blood flow good Complications: No apparent complications Patient did tolerate procedure well. Chest X-ray ordered to verify placement.  CXR: pending.  BABCOCK,PETE 10/11/2014, 12:25 PM  [place] Shock La up Need cvp  Mcarthur Rossettianiel J. Tyson AliasFeinstein, MD, FACP Pgr: [phone] [facility]

## 2014-10-11 NOTE — Progress Notes (Signed)
Pt transferred to 2M07 via bed with belongings and with sister. Daugther called and notified of new room number.

## 2014-10-11 NOTE — Progress Notes (Signed)
Has received 3230ml/kg bolus. Remains hypotensive. Her mental status has not improved.   Plan Initiated sepsis protocol  Simonne MartinetPeter E Maurisa Tesmer ACNP-BC [facility] Pager # [phone] OR # [phone] if no answer

## 2014-10-11 NOTE — Progress Notes (Addendum)
ANTIBIOTIC CONSULT NOTE - INITIAL  Pharmacy Consult for Vancomycin and Zosyn  Indication: rule out sepsis  No Known Allergies  Patient Measurements: Height: 5' (152.4 cm) Weight: 142 lb 3.2 oz (64.5 kg) IBW/kg (Calculated) : 45.5  Vital Signs: Temp: 100.3 F (37.9 C) (06/02 0600) Temp Source: Oral (06/02 0600) BP: 103/54 mmHg (06/02 0700) Pulse Rate: 64 (06/02 0700) Intake/Output from previous day:   Intake/Output from this shift:    Labs:  Recent Labs  10/10/14 1240 10/11/14 0520  WBC 4.0 14.0*  HGB 11.4* 12.0  PLT 128* 169  CREATININE 3.10* 3.17*   Estimated Creatinine Clearance: 16.4 mL/min (by C-G formula based on Cr of 3.17). No results for input(s): VANCOTROUGH, VANCOPEAK, VANCORANDOM, GENTTROUGH, GENTPEAK, GENTRANDOM, TOBRATROUGH, TOBRAPEAK, TOBRARND, AMIKACINPEAK, AMIKACINTROU, AMIKACIN in the last 72 hours.   Microbiology: Recent Results (from the past 720 hour(s))  MRSA PCR Screening     Status: None   Collection Time: 10/10/14  8:33 PM  Result Value Ref Range Status   MRSA by PCR NEGATIVE NEGATIVE Final    Comment:        The GeneXpert MRSA Assay (FDA approved for NASAL specimens only), is one component of a comprehensive MRSA colonization surveillance program. It is not intended to diagnose MRSA infection nor to guide or monitor treatment for MRSA infections.     Medical History: Past Medical History  Diagnosis Date  . CHF (congestive heart failure)     a. HFpEF with RHF/anasarca/pHTN.  . COPD (chronic obstructive pulmonary disease)   . Coronary artery disease     a. Per [place] records: NSTEMI 01/2012, tx medically given ARF but suspected CAD. b. Stress test 12/16/11 reported w/o ischemia.  . Hypertension   . Diabetes mellitus   . OSA (obstructive sleep apnea)     a. Pt reported used to use CPAP in [place], but "ran out" when came to [facility].  Marland Kitchen CKD (chronic kidney disease), stage III     a. Per [place] records: h/o ARF after CTA that ruled out PE.  Marland Kitchen  Pulmonary hypertension     a. RHC 02/28/13: mod pulm HTN with normal PVR suggestive of predominantly pulmonary venous HTN.  . Right heart failure   . Anasarca     a. Per [place] records - due to pulm HTN with R HF.   Marland Kitchen Aseptic meningitis     a. 09/2012: adm in [place] for metabolic encephalopathy, oliguric tubular necrosis, anemia, HTN, possible CVA, HHNKA.  Marland Kitchen CVA (cerebral infarction)     a. Per [place] records, "possible CVA" 09/2012 but MRI reportedly negative.  . Abnormal Doppler ultrasound of carotid artery     a. Per [place] records: <50% LICA.  Marland Kitchen Renal insufficiency     Medications:  Scheduled:  . amLODipine  10 mg Oral Daily  . aspirin EC  81 mg Oral Daily  . calcitRIOL  0.25 mcg Oral Daily  . carvedilol  25 mg Oral BID WC  . cloNIDine  0.1 mg Oral BID  . [START ON 10/12/2014] ferrous sulfate  325 mg Oral Q M,W,F-HD  . gabapentin  300 mg Oral TID  . heparin  5,000 Units Subcutaneous 3 times per day  . hydrALAZINE  50 mg Oral 3 times per day  . insulin aspart  0-9 Units Subcutaneous Q4H  . isosorbide mononitrate  120 mg Oral Daily  . morphine  4 mg Intravenous Once  . pantoprazole  40 mg Oral BID  . piperacillin-tazobactam (ZOSYN)  IV  2.25  g Intravenous 3 times per day  . sevelamer carbonate  800 mg Oral TID WC  . sodium chloride  3 mL Intravenous Q12H  . sodium chloride  3 mL Intravenous Q12H  . vancomycin  1,250 mg Intravenous Once   Assessment: 59 y.o. female with fevers/hypotension, possible sepsis, for empiric antibiotics  Goal of Therapy:  Pre-HD level 15-25  Plan:  Vancomycin 1250 mg IV now, then 500 mg IV after each HD Zosyn 2.25 g IV q8h  Abbott, Gary FleetGregory Vernon 10/11/2014,7:33 AM   Addendum:  Change Zosyn to Imipenem per CCM for r/o pneumonia.  Plan: Imipenem 250mg  IV every 12 hours (HD dosing).   Link SnufferJessica Jamya Starry, PharmD, BCPS Clinical Pharmacist [phone] 10/11/2014, 10:43 AM

## 2014-10-11 NOTE — Progress Notes (Signed)
Hypoglycemic Event  CBG: 70  Treatment: D50 IV 25 mL  Symptoms: Decreased LOC, lethargic  Follow-up CBG: Time:0650 CBG Result:118  Possible Reasons for Event: Other: NPO  Comments/MD notified: Maren ReamerKaren Kirby, NP    Kerby MoorsBROWN,Lassie Demorest  Remember to initiate Hypoglycemia Order Set & complete

## 2014-10-11 NOTE — Consult Note (Signed)
Renal Service Consult Note [facility]  Randa Negron 10/11/2014 Delano MetzSCHERTZ,Hermes Wafer D Requesting Physician:  Dr Tyson AliasFeinstein  Reason for Consult:  ESRD pt with uncont DM , AMS, fever HPI: The patient is a 59 y.o. year-old with hx of DM, HTN, right sided CHF, pulm HTN, COPD and ESRD on HD MWF schedule.  She came to ED due to abd pain and dry heaves. Was found to have BS of 1340.  She is diabetic on insulin.  Admitted then transferred overnight to ICU with AMS and hypotension.  She had rec'd neurontin and clonidine as well as MSO4 one time 4mg  dose.  Moved to ICU, got narcan and had partial response. BP is in the 80's.  Spiked temp to 103 overnight , WBC up to 14k today. Pt does not provide any history.   Past Medical History  Past Medical History  Diagnosis Date  . CHF (congestive heart failure)     a. HFpEF with RHF/anasarca/pHTN.  . COPD (chronic obstructive pulmonary disease)   . Coronary artery disease     a. Per [place] records: NSTEMI 01/2012, tx medically given ARF but suspected CAD. b. Stress test 12/16/11 reported w/o ischemia.  . Hypertension   . Diabetes mellitus   . OSA (obstructive sleep apnea)     a. Pt reported used to use CPAP in [place], but "ran out" when [facility]  Marland Kitchen. CKD (chronic kidney disease), stage III     a. Per [place] records: h/o ARF after CTA that ruled out PE.  Marland Kitchen. Pulmonary hypertension     a. RHC 02/28/13: mod pulm HTN with normal PVR suggestive of predominantly pulmonary venous HTN.  . Right heart failure   . Anasarca     a. Per [place] records - due to pulm HTN with R HF.   Marland Kitchen. Aseptic meningitis     a. 09/2012: adm in [place] for metabolic encephalopathy, oliguric tubular necrosis, anemia, HTN, possible CVA, HHNKA.  Marland Kitchen. CVA (cerebral infarction)     a. Per [place] records, "possible CVA" 09/2012 but MRI reportedly negative.  . Abnormal Doppler ultrasound of carotid artery     a. Per [place] records: <50% LICA.  Marland Kitchen. Renal insufficiency    Past Surgical History  Past Surgical History   Procedure Laterality Date  . Tubal ligation    . Abdominal hysterectomy    . Esophagogastroduodenoscopy N/A 02/16/2013    Procedure: ESOPHAGOGASTRODUODENOSCOPY (EGD);  Surgeon: Graylin ShiverSalem F Ganem, MD;  [facility];  Service: Endoscopy;  Laterality: N/A;  . Insertion of dialysis catheter Right 03/03/2013    Procedure: INSERTION OF DIALYSIS CATHETER;  Surgeon: Larina Earthlyodd F Early, MD;  [facility];  Service: Vascular;  Laterality: Right;  Right Internal Jugular Placement  . Av fistula placement Left 03/03/2013    Procedure: ARTERIOVENOUS (AV) FISTULA CREATION, Brachial/Cephalic;  Surgeon: Larina Earthlyodd F Early, MD;  [facility];  Service: Vascular;  Laterality: Left;  . Right heart catheterization N/A 02/28/2013    Procedure: RIGHT HEART CATH;  Surgeon: Dolores Pattyaniel R Bensimhon, MD;  [facility];  Service: Cardiovascular;  Laterality: N/A;  . Shuntogram N/A 07/13/2013    Procedure: FISTULOGRAM;  Surgeon: Fransisco HertzBrian L Chen, MD;  [facility];  Service: Cardiovascular;  Laterality: N/A;   Family History  Family History  Problem Relation Age of Onset  . Diabetes Mellitus II Sister   . Diabetes Mellitus II Brother   . CAD Brother    Social History  reports that she has quit  smoking. Her smokeless tobacco use includes Snuff and Chew. She reports that she does not drink alcohol or use illicit drugs. Allergies No Known Allergies Home medications Prior to Admission medications   Medication Sig Start Date End Date Taking? Authorizing Provider  acetaminophen (TYLENOL) 500 MG tablet Take 500-1,000 mg by mouth every 6 (six) hours as needed for moderate pain.   Yes Historical Provider, MD  albuterol (PROVENTIL HFA;VENTOLIN HFA) 108 (90 BASE) MCG/ACT inhaler Inhale 2 puffs into the lungs every 6 (six) hours as needed for wheezing or shortness of breath.    Yes Historical Provider, MD  amLODipine (NORVASC) 10 MG tablet Take 10 mg by mouth daily.    Yes Historical Provider, MD  aspirin EC 81 MG  tablet Take 81 mg by mouth daily.   Yes Historical Provider, MD  calcitRIOL (ROCALTROL) 0.25 MCG capsule Take 0.25 mcg by mouth daily.   Yes Historical Provider, MD  calcium acetate (PHOSLO) 667 MG capsule Take 667 mg by mouth 3 (three) times daily with meals.   Yes Historical Provider, MD  carvedilol (COREG) 25 MG tablet Take 25 mg by mouth 2 (two) times daily with a meal.   Yes Historical Provider, MD  cloNIDine (CATAPRES) 0.1 MG tablet Take 0.1 mg by mouth 2 (two) times daily.   Yes Historical Provider, MD  ferrous sulfate 325 (65 FE) MG tablet Take 325 mg by mouth every Monday, Wednesday, and Friday with hemodialysis.    Yes Historical Provider, MD  gabapentin (NEURONTIN) 300 MG capsule Take 300 mg by mouth 3 (three) times daily.    Yes Historical Provider, MD  hydrALAZINE (APRESOLINE) 100 MG tablet Take 50 mg by mouth 3 (three) times daily.   Yes Historical Provider, MD  insulin glargine (LANTUS) 100 UNIT/ML injection Inject 0.08 mLs (8 Units total) into the skin at bedtime. Patient taking differently: Inject 10 Units into the skin at bedtime.  02/09/14  Yes Penny Pia, MD  insulin lispro (HUMALOG) 100 UNIT/ML injection Inject 3-10 Units into the skin 3 (three) times daily before meals. Sliding scale.  CBG 200: 3 UNITS, 250 5 UNITS, 300 7 UNITS, 350 9-10 UNITS   Yes Historical Provider, MD  isosorbide mononitrate (IMDUR) 120 MG 24 hr tablet Take 120 mg by mouth daily.   Yes Historical Provider, MD  nitroGLYCERIN (NITROSTAT) 0.4 MG SL tablet Place 1 tablet (0.4 mg total) under the tongue every 5 (five) minutes as needed for chest pain. 07/05/14  Yes Dolores Patty, MD  pantoprazole (PROTONIX) 40 MG tablet Take 1 tablet (40 mg total) by mouth 2 (two) times daily. 06/05/13  Yes Coolidge Breeze, MD  promethazine (PHENERGAN) 25 MG tablet Take 25 mg by mouth 2 (two) times daily as needed for nausea or vomiting.    Yes Historical Provider, MD  sevelamer carbonate (RENVELA) 800 MG tablet Take 800  mg by mouth 3 (three) times daily with meals.   Yes Historical Provider, MD  traMADol (ULTRAM) 50 MG tablet Take 50 mg by mouth every 12 (twelve) hours as needed for severe pain.    Yes Historical Provider, MD   Liver Function Tests  Recent Labs Lab 10/10/14 1240 10/11/14 0520  AST 58* 52*  ALT 41 34  ALKPHOS 334* 222*  BILITOT 0.9 0.4  PROT 6.8 5.5*  ALBUMIN 3.5 2.7*    Recent Labs Lab 10/10/14 1240  LIPASE 42   CBC  Recent Labs Lab 10/10/14 1240 10/11/14 0520  WBC 4.0 14.0*  NEUTROABS 3.2  --  HGB 11.4* 12.0  HCT 35.1* 33.1*  MCV 95.6 88.3  PLT 128* 169   Basic Metabolic Panel  Recent Labs Lab 10/10/14 1240 10/11/14 0520  NA 120* 132*  K 3.8 3.2*  CL 80* 96*  CO2 25 24  GLUCOSE 1374* 79  BUN 10 13  CREATININE 3.10* 3.17*  CALCIUM 9.2 9.1    Filed Vitals:   10/11/14 0810 10/11/14 0818 10/11/14 0852 10/11/14 0900  BP: 89/49   110/54  Pulse: 62 63  62  Temp:   99 F (37.2 C)   TempSrc:   Oral   Resp: Height:      Weight:    64.9 kg (143 lb 1.3 oz)  SpO2: 98% 98%  100%   Exam Obtunded, will awaken to loud voice say her name and go back to sleep No rash, cyanosis or gangrene Throat clear No jvd, nodes Chest clear bilat RRR 2/6 SEM no RG Abd soft ntnd no mass or ascites, dec'd BS Ext no ulcer, wounds or gangrene, no edema Neuro moves all extremities, somnolent and difficult to arouse LUA AV fistula patent no signs of infection  HD: MWF [place] 4h   61kg   2/2 bath  Heparin 1800   LUA AVF Aranesp 25 ug every 2wks Calcitriol 1.25 ug tiw OP labs  tsat 52%, ferr 1380, Hb 11 // pth 405, phos 3.5, Ca 9.5  CT abd - fullness at head of pancreas, prob focal pancreatitis Assessment: 1. AMS / fever/ hypotension - no source of infection noted on exam/ CXR/ UA.  Getting empiric abx per CCM and may need pressors 2. Abd pain - CT suggesting focal pancreatitis, not sure that would explain fever/ hypotension   3. ESRD on HD , no need for  dialysis right now, will hold off and observe for now 4. HTN on 4 BP meds at home 5. DM uncontrolled - BS 1300 > now 110, per primary team 6. Hx COPD / pulm HTN/ R HF 7. MBD resume meds when more stable and eating   Plan- will follow  Vinson Moselle MD (pgr) [phone]    ([phone] 10/11/2014, 11:19 AM

## 2014-10-11 NOTE — Consult Note (Signed)
PULMONARY / CRITICAL CARE MEDICINE   Name: Madeline [name] MRN: [id number] DOB: 12-13-1955    ADMISSION DATE:  10/10/2014 CONSULTATION DATE:  6/2  REFERRING MD :  Selena Batten  CHIEF COMPLAINT:  Sepsis/septic shock and AMS  INITIAL PRESENTATION:  59 year old female w/ ESRD (MWF), HTn and DM. Admitted on 6/1 w/ sudden onset of abd pain, N/V prior to starting HD. On arrival to ER CBG 1374. CT abd suggested possible pancreatitis but Lipase was nml. On am of 6/2 developed hypotension, fever and decreased LOC. PCCM consulted w/ concern for septic shock.   STUDIES:  CT abd/pelvis 6/1: swelling of Pancreatic head and Uncinate process w/out evidence of mass OR pancreatic ductal dilatation. Concerning for Pancreatitis. Small HH. Distal esophageal wall thickening suspicious for reflux esophagitis.   SIGNIFICANT EVENTS: 6/1: Admitted w/ diffuse abd pain while at HD + N/V. In ER initial blood glucose 1374. CT abd/pelvis worrisome for pancreatitis, but Lipase WNL. Admitted for rx of hyperglycemia and eval of abd pain. Placed on insulin gtt and gentle fluid (2 liters in ER).  6/2: febrile (103.1) and more lethargic; glucose 70; SBP 80s-->down to 70s AFTER 500 ml bolus. Abx started.  PCCM asked to eval. Last dilaudid was 1924 on 6/1. Got 1 amp Narcan on 6/2 at 0920 w/ positive response. 30 ml/kg fluid challenge given with positive response.    HISTORY OF PRESENT ILLNESS:  59 year old AAF w/ sig h/o ESRD (HD M/W/F EDW ~ 61 Kg from prior records), DM type II,  Diastolic dysfxn w/ estimated PAH (by ECHO); was in usual [facility] until 6/1 while getting scheduled HD developed diffuse abd pain, nausea and vomiting. Presented to ER: dx eval found her to be hyperglycemic w/ glucose 1374, CT abd worrisome for pancreatitis vs. pancreatic mass. 6/2 AM patient experienced low BP with systolics 70-80's and change in mentation. PCCM consulted after SBP remained in the 70's post fluid bolus. Initial exam she has a GCS of 13 and  demonstrates ability to protect her airway. Review of her medications over the past 24 hours reveals she received 0.5mg  dilaudid, scheduled clonidine and gabapentin the evening of 6/1. Her scheduled hydralazine was held 6/1 PM. In light of this, one dose of narcan was administered with a positive response. PCCM will follow and continue workup of fever, leukocytosis, and hypotension.  PAST MEDICAL HISTORY :   has a past medical history of CHF (congestive heart failure); COPD (chronic obstructive pulmonary disease); Coronary artery disease; Hypertension; Diabetes mellitus; OSA (obstructive sleep apnea); CKD (chronic kidney disease), stage III; Pulmonary hypertension; Right heart failure; Anasarca; Aseptic meningitis; CVA (cerebral infarction); Abnormal Doppler ultrasound of carotid artery; and Renal insufficiency.  has past surgical history that includes Tubal ligation; Abdominal hysterectomy; Esophagogastroduodenoscopy (N/A, 02/16/2013); Insertion of dialysis catheter (Right, 03/03/2013); AV fistula placement (Left, 03/03/2013); right heart catheterization (N/A, 02/28/2013); and shuntogram (N/A, 07/13/2013). Prior to Admission medications   Medication Sig Start Date End Date Taking? Authorizing Provider  acetaminophen (TYLENOL) 500 MG tablet Take 500-1,000 mg by mouth every 6 (six) hours as needed for moderate pain.   Yes Historical Provider, MD  albuterol (PROVENTIL HFA;VENTOLIN HFA) 108 (90 BASE) MCG/ACT inhaler Inhale 2 puffs into the lungs every 6 (six) hours as needed for wheezing or shortness of breath.    Yes Historical Provider, MD  amLODipine (NORVASC) 10 MG tablet Take 10 mg by mouth daily.    Yes Historical Provider, MD  aspirin EC 81 MG tablet Take 81 mg by mouth  daily.   Yes Historical Provider, MD  calcitRIOL (ROCALTROL) 0.25 MCG capsule Take 0.25 mcg by mouth daily.   Yes Historical Provider, MD  calcium acetate (PHOSLO) 667 MG capsule Take 667 mg by mouth 3 (three) times daily with meals.    Yes Historical Provider, MD  carvedilol (COREG) 25 MG tablet Take 25 mg by mouth 2 (two) times daily with a meal.   Yes Historical Provider, MD  cloNIDine (CATAPRES) 0.1 MG tablet Take 0.1 mg by mouth 2 (two) times daily.   Yes Historical Provider, MD  ferrous sulfate 325 (65 FE) MG tablet Take 325 mg by mouth every Monday, Wednesday, and Friday with hemodialysis.    Yes Historical Provider, MD  gabapentin (NEURONTIN) 300 MG capsule Take 300 mg by mouth 3 (three) times daily.    Yes Historical Provider, MD  hydrALAZINE (APRESOLINE) 100 MG tablet Take 50 mg by mouth 3 (three) times daily.   Yes Historical Provider, MD  insulin glargine (LANTUS) 100 UNIT/ML injection Inject 0.08 mLs (8 Units total) into the skin at bedtime. Patient taking differently: Inject 10 Units into the skin at bedtime.  02/09/14  Yes Penny Pia, MD  insulin lispro (HUMALOG) 100 UNIT/ML injection Inject 3-10 Units into the skin 3 (three) times daily before meals. Sliding scale.  CBG 200: 3 UNITS, 250 5 UNITS, 300 7 UNITS, 350 9-10 UNITS   Yes Historical Provider, MD  isosorbide mononitrate (IMDUR) 120 MG 24 hr tablet Take 120 mg by mouth daily.   Yes Historical Provider, MD  nitroGLYCERIN (NITROSTAT) 0.4 MG SL tablet Place 1 tablet (0.4 mg total) under the tongue every 5 (five) minutes as needed for chest pain. 07/05/14  Yes Dolores Patty, MD  pantoprazole (PROTONIX) 40 MG tablet Take 1 tablet (40 mg total) by mouth 2 (two) times daily. 06/05/13  Yes Coolidge Breeze, MD  promethazine (PHENERGAN) 25 MG tablet Take 25 mg by mouth 2 (two) times daily as needed for nausea or vomiting.    Yes Historical Provider, MD  sevelamer carbonate (RENVELA) 800 MG tablet Take 800 mg by mouth 3 (three) times daily with meals.   Yes Historical Provider, MD  traMADol (ULTRAM) 50 MG tablet Take 50 mg by mouth every 12 (twelve) hours as needed for severe pain.    Yes Historical Provider, MD   No Known Allergies  FAMILY HISTORY:  indicated  that her mother is deceased. She indicated that her father is deceased.  SOCIAL HISTORY:  reports that she has quit smoking. Her smokeless tobacco use includes Snuff and Chew. She reports that she does not drink alcohol or use illicit drugs.  REVIEW OF SYSTEMS:  Unable to obtain   VITAL SIGNS: Temp:  [98.2 F (36.8 C)-103.1 F (39.5 C)] 99 F (37.2 C) (06/02 0852) Pulse Rate:  [58-73] 62 (06/02 0900) Resp:  [11-26] 11 (06/02 0900) BP: (75-197)/(47-99) 110/54 mmHg (06/02 0900) SpO2:  [90 %-100 %] 100 % (06/02 0900) Weight:  [60.328 kg (133 lb)-64.9 kg (143 lb 1.3 oz)] 64.9 kg (143 lb 1.3 oz) (06/02 0900) HEMODYNAMICS:   VENTILATOR SETTINGS:   INTAKE / OUTPUT:  Intake/Output Summary (Last 24 hours) at 10/11/14 1610 Last data filed at 10/11/14 0900  Gross per 24 hour  Intake 1614.26 ml  Output      0 ml  Net 1614.26 ml    PHYSICAL EXAMINATION: General: Lethargic female lying in bed  Neuro: Responsive to voice, confused HEENT: Pupils equal, 5mm sluggish  Cardiovascular: Regular rate and  rhythm, no rubs, gallops, or murmurs Lungs:CTA anterior bilaterally Abdomen: Soft, nondistened, slightly tender to palpation Musculoskeletal:intact Skin:no obvious wounds or rashes.   LABS:  CBC  Recent Labs Lab 10/10/14 1240 10/11/14 0520  WBC 4.0 14.0*  HGB 11.4* 12.0  HCT 35.1* 33.1*  PLT 128* 169   Coag's No results for input(s): APTT, INR in the last 168 hours. BMET  Recent Labs Lab 10/10/14 1240 10/11/14 0520  NA 120* 132*  K 3.8 3.2*  CL 80* 96*  CO2 25 24  BUN 10 13  CREATININE 3.10* 3.17*  GLUCOSE 1374* 79   Electrolytes  Recent Labs Lab 10/10/14 1240 10/11/14 0520  CALCIUM 9.2 9.1   Sepsis Markers No results for input(s): LATICACIDVEN, PROCALCITON, O2SATVEN in the last 168 hours. ABG No results for input(s): PHART, PCO2ART, PO2ART in the last 168 hours. Liver Enzymes  Recent Labs Lab 10/10/14 1240 10/11/14 0520  AST 58* 52*  ALT 41 34   ALKPHOS 334* 222*  BILITOT 0.9 0.4  ALBUMIN 3.5 2.7*   Cardiac Enzymes  Recent Labs Lab 10/10/14 1240  TROPONINI <0.03   Glucose  Recent Labs Lab 10/11/14 0318 10/11/14 0434 10/11/14 0615 10/11/14 0653 10/11/14 0740 10/11/14 0847  GLUCAP 138* 76 70 118* 111* 119*    Imaging Ct Abdomen Pelvis W Contrast  10/10/2014   CLINICAL DATA:  Severe abdominal pain for 1 week. Chronic kidney disease on dialysis.  EXAM: CT ABDOMEN AND PELVIS WITH CONTRAST  TECHNIQUE: Multidetector CT imaging of the abdomen and pelvis was performed using the standard protocol following bolus administration of intravenous contrast.  CONTRAST:  100mL OMNIPAQUE IOHEXOL 300 MG/ML  SOLN  COMPARISON:  01/07/2014  FINDINGS: Lower Chest:  Unremarkable.  Hepatobiliary: No masses or other significant abnormality identified. Gallbladder is unremarkable.  Pancreas: Enlargement and heterogeneous attenuation is seen involving the pancreatic head and uncinate process, without evidence of a discrete mass. This is suspicious for mild acute pancreatitis. No evidence of pancreatic necrosis or peripancreatic fluid collections. No evidence of pancreatic ductal dilatation.  Spleen:  Within normal limits in size and appearance.  Adrenals:  No masses identified.  Kidneys/Urinary Tract:  No evidence of masses or hydronephrosis.  Stomach/Bowel/Peritoneum: A small hiatal hernia is seen and there is also wall thickening of the distal esophagus suspicious for reflux esophagitis. No evidence of bowel wall thickening or dilatation. Normal appendix visualized.  Vascular/Lymphatic: No pathologically enlarged lymph nodes identified. No other significant abnormality visualized.  Reproductive: Prior hysterectomy noted. Adnexal regions are unremarkable in appearance.  Other:  None.  Musculoskeletal:  No suspicious bone lesions identified.  IMPRESSION: Swelling of pancreatic head and uncinate process, without evidence of discrete mass or pancreatic ductal  dilatation. This is suspicious for focal pancreatitis. Recommend clinical correlation with serum amylase and lipase levels.  Small hiatal hernia, with distal esophageal wall thickening suspicious for reflux esophagitis. Consider endoscopy for further evaluation.   Electronically Signed   By: Myles RosenthalJohn  Stahl M.D.   On: 10/10/2014 17:23   Dg Chest Port 1 View  10/11/2014   CLINICAL DATA:  Fevers  EXAM: PORTABLE CHEST - 1 VIEW  COMPARISON:  01/07/2014  FINDINGS: The heart size and mediastinal contours are within normal limits. Both lungs are clear. The visualized skeletal structures are unremarkable.  IMPRESSION: No active disease.   Electronically Signed   By: Alcide CleverMark  Lukens M.D.   On: 10/11/2014 07:07     ASSESSMENT / PLAN:  PULMONARY  A:   Possible evolving layering pleural effusion (left)-->developed  AFTER fluid resuscitation  P:   -Maintain O2 Sats >92%   CARDIOVASCULAR  A:  possible sepsis/ septic shock vs pancreatitis/ SIRS c/b narcotics, antihypertensives +/- volume depletion.  Diastolic HF P:  -30 ml/kg fluid bolus  - accept SBP > 100 if lactic acid clears.  -hold antihypertensives, on clonidine monitor for rebound HTN. -hold opioids for now -Ck cortisol and start stress dose steroids if levophed needs > 10 mcg/min   RENAL A:  ESRD (HD dependent) Hypokalemia Hyponatremia w/ rapid correction (120-->132 in less than 24hrs) P:   -continue scheduled HD  -consult nephrology for HD inpatient management -NS infusion/ keep euvolemic at this point  -K replacement    GASTROINTESTINAL A:   ABD pain NOS, (CT concerning for pancreatitis vs pancreatic mass) Elevated aminotransferases  P:   -Follow hepatic panel, amylase, lipase- daily - US liver, gallbladder -CA19-9 -NPO for now - consider MRCP in future    HEMATOLOGIC A:   No acute problem  P:  -Monitor daily CBC to trend leukocytosis -Heparin for DVT chemoprophylaxis  INFECTIOUS A:   SIRS vs sepsis: source unclear...  Not convinced that pancreatitis is the driving issue. ? Bacterial translocation during HD.Marland Kitchen She is at risk for bacteremia.  P:   -Continue Vancomycin 6/2>>> -D/C zosyn and start Imipenem (pharmacy to dose d/t renal impairment)  6/2>>> BCx2:6/2>>> UC :6/2>>>   ENDOCRINE A:   DM2 Hyperglycemia  P:   -Hold current insulin regimen-lantus and novolog -Q4 accuchecks while NPO -SSI per protocol   NEUROLOGIC A:   Acute Metabolic encephalopathy: suspect multifactorial: narcotics, hypotension +/- sepsis  P:   -RASS goal: 0 -Close observation of mental status  -Hold all sedating medications -Narcan PRN      FAMILY  - Updates: Sister at bedside. Updated on status and noted that we are continuing to work up her source of hypotension.   - Inter-disciplinary family meet or Palliative Care meeting due by: 6/9    TODAY'S SUMMARY: 59 yo female admitted 6/1 for abd pain workup on 6/1. 6/2 found to be lethargic, hypotensive in the 70's. Primary diff dx: septic shock vs polypharmacy (narcs/clonidine). Had CT abd concerning for pancreatitis, but lipase nml. Not convinced that the pancreas is the driving factor here. Will widen abx, start sepsis protocol, and cont supportive care as we await further culture data.   Simonne Martinet ACNP-BC [facility] Pulmonary/Critical Care Pager # [phone] OR # [phone] if no answer  10/11/2014, 9:37 AM  STAFF NOTE: I, Rory Percy, MD FACP have personally reviewed patient's available data, including medical history, events of note, physical examination and test results as part of my evaluation. I have discussed with resident/NP and other care providers such as pharmacist, RN and RRT. In addition, I personally evaluated patient and elicited key findings of: lethargic, hypotension, appears so dehydrated, dry, pancreatic head noted, get ca 19-9 as not impressed with lipase or pancreatis as cuase, concern sepsis, stat LA, give 30 cc/kg , place line, start levo  to MAP goal 60, repeat perfusion exam, cvp, obtain cvp goal 12, ensure BC, change zosyn to imi, keep vanc, transient bacteremia during HD?? The patient is critically ill with multiple organ systems failure and requires high complexity decision making for assessment and support, frequent evaluation and titration of therapies, application of advanced monitoring technologies and extensive interpretation of multiple databases.   Critical Care Time devoted to patient care services described in this note is30  Minutes. This time reflects time of care of this  signee: Daniel FeinsteinRory Percyis critical care time does not reflect procedure time, or teaching time or supervisory time of PA/NP/Med student/Med Resident etc but could involve care discussion time. Rest per NP/medical resident whose note is outlined above and that I agree with   Mcarthur Rossetti. Tyson Alias, MD, FACP Pgr: [phone] [facility] 10/11/2014 11:33 AM

## 2014-10-11 NOTE — Care Management Note (Signed)
Case Management Note  Patient Details  Name: Madeline [name] MRN: [id number] Date of Birth: 01-07-1956  Subjective/Objective:      59 y.o. F admitted with Untreated D.M., Fever, AMS. Probable from home with Daughter. (per Nurse)  Pt receives HD M-W-F. Status changed from SD to ICU d/t sepsis today.              Action/Plan: Will continue to follow for d/c needs.    Expected Discharge Date:                  Expected Discharge Plan:  Home/Self Care (From Home with Daughter [name])  In-House Referral:     Discharge planning Services     Post Acute Care Choice:    Choice offered to:     DME Arranged:    DME Agency:     HH Arranged:    HH Agency:     Status of Service:  In process, will continue to follow  Medicare Important Message Given:    Date Medicare IM Given:    Medicare IM give by:    Date Additional Medicare IM Given:    Additional Medicare Important Message give by:     If discussed at Long Length of Stay Meetings, dates discussed:    Additional Comments:  [name], [name], RN 10/11/2014, 2:58 PM

## 2014-10-11 NOTE — Progress Notes (Signed)
PROGRESS NOTE  Madeline [name] ZOX:[id number] DOB: 12/10/1955 DOA: 10/10/2014 PCP: Pamelia Hoit, MD  HPI: 59 yo female with dm2, esrd on HD, M, W, F , CHF (EF ?) c/o abdominal pain, diffuse starting while at dialysis. + n/v  Subjective / 24 H Interval events - poorly responsive this morning, unable to wake up to sternal rub - overnight febrile, hypotensive, progressively lethargic  Assessment/Plan: Active Problems:   DM (diabetes mellitus)   Hyperglycemia   ESRD on hemodialysis   Anemia   Septic shock - febrile, hypotensive overnight, poor mental status in the setting of hypotension - presumed intraabdominal vs unknown, started vancomycin and zosyn - blood cultures, lactic acid, procalcitonin - PCCM consulted, transfer to ICU  Acute encephalopathy - due to #1, pressor support  IDDM - insulin gtt overnight, CBS better - SSI, insulin protocol per ICU  ESRD  - nephrology consulted  Abdominal pain - ?pancreatitis based on CT scan - empiric antibiotics  HTN - hold all antihypertensives in the setting of shock  Anemia - in the setting of CKD, monitor   Diet: Diet NPO time specified Except for: Ice Chips, Sips with Meds Fluids: bolus 500 cc DVT Prophylaxis: heparin  Code Status: Full Code Family Communication: d/w sister bedside  Disposition Plan: transfer to ICU  Consultants:  PCCM  Procedures:  None    Antibiotics  Anti-infectives    Start     Dose/Rate Route Frequency Ordered Stop   10/12/14 1200  vancomycin (VANCOCIN) 500 mg in sodium chloride 0.9 % 100 mL IVPB     500 mg 100 mL/hr over 60 Minutes Intravenous Every M-W-F (Hemodialysis) 10/11/14 0736     10/11/14 0800  vancomycin (VANCOCIN) 1,250 mg in sodium chloride 0.9 % 250 mL IVPB     1,250 mg 166.7 mL/hr over 90 Minutes Intravenous  Once 10/11/14 0730     10/11/14 0800  piperacillin-tazobactam (ZOSYN) IVPB 2.25 g     2.25 g 100 mL/hr over 30 Minutes Intravenous 3 times per day 10/11/14  0733     10/11/14 0745  piperacillin-tazobactam (ZOSYN) IVPB 3.375 g  Status:  Discontinued     3.375 g 100 mL/hr over 30 Minutes Intravenous  Once 10/11/14 0730 10/11/14 0732       Studies  Ct Abdomen Pelvis W Contrast  10/10/2014   CLINICAL DATA:  Severe abdominal pain for 1 week. Chronic kidney disease on dialysis.  EXAM: CT ABDOMEN AND PELVIS WITH CONTRAST  TECHNIQUE: Multidetector CT imaging of the abdomen and pelvis was performed using the standard protocol following bolus administration of intravenous contrast.  CONTRAST:  OMNIPAQUE IOHEXOL 300 MG/ML  SOLN  COMPARISON:  01/07/2014  FINDINGS: Lower Chest:  Unremarkable.  Hepatobiliary: No masses or other significant abnormality identified. Gallbladder is unremarkable.  Pancreas: Enlargement and heterogeneous attenuation is seen involving the pancreatic head and uncinate process, without evidence of a discrete mass. This is suspicious for mild acute pancreatitis. No evidence of pancreatic necrosis or peripancreatic fluid collections. No evidence of pancreatic ductal dilatation.  Spleen:  Within normal limits in size and appearance.  Adrenals:  No masses identified.  Kidneys/Urinary Tract:  No evidence of masses or hydronephrosis.  Stomach/Bowel/Peritoneum: A small hiatal hernia is seen and there is also wall thickening of the distal esophagus suspicious for reflux esophagitis. No evidence of bowel wall thickening or dilatation. Normal appendix visualized.  Vascular/Lymphatic: No pathologically enlarged lymph nodes identified. No other significant abnormality visualized.  Reproductive: Prior hysterectomy noted. Adnexal regions are unremarkable  in appearance.  Other:  None.  Musculoskeletal:  No suspicious bone lesions identified.  IMPRESSION: Swelling of pancreatic head and uncinate process, without evidence of discrete mass or pancreatic ductal dilatation. This is suspicious for focal pancreatitis. Recommend clinical correlation with serum  amylase and lipase levels.  Small hiatal hernia, with distal esophageal wall thickening suspicious for reflux esophagitis. Consider endoscopy for further evaluation.   Electronically Signed   By: Myles RosenthalJohn  Stahl M.D.   On: 10/10/2014 17:23   Dg Chest Port 1 View  10/11/2014   CLINICAL DATA:  Fevers  EXAM: PORTABLE CHEST - 1 VIEW  COMPARISON:  01/07/2014  FINDINGS: The heart size and mediastinal contours are within normal limits. Both lungs are clear. The visualized skeletal structures are unremarkable.  IMPRESSION: No active disease.   Electronically Signed   By: Alcide CleverMark  Lukens M.D.   On: 10/11/2014 07:07    Objective  Filed Vitals:   10/11/14 0600 10/11/14 0610 10/11/14 0645 10/11/14 0700  BP: 79/56 80/53 99/55  103/54  Pulse: 65 65 63 64  Temp: 100.3 F (37.9 C)     TempSrc: Oral     Resp: 13 14 13 15   Height:      Weight:      SpO2: 97% 98% 98% 98%   No intake or output data in the 24 hours ending 10/11/14 0740 Filed Weights   10/10/14 1149 10/10/14 2025  Weight: 60.328 kg (133 lb) 64.5 kg (142 lb 3.2 oz)    Exam:  General:  Lethargic, poorly responsive, confused  HEENT: no scleral icterus, PERRL  Cardiovascular: RRR without MRG, 2+ peripheral pulses, no edema  Respiratory: CTA biL, good air movement, no wheezing, no crackles, no rales  Abdomen: soft, BS +  MSK/Extremities: no clubbing/cyanosis, no joint swelling  Skin: no rashes  Neuro: lethargic, not following commands  Data Reviewed: Basic Metabolic Panel:  Recent Labs Lab 10/10/14 1240 10/11/14 0520  NA 120* 132*  K 3.8 3.2*  CL 80* 96*  CO2 25 24  GLUCOSE 1374* 79  BUN 10 13  CREATININE 3.10* 3.17*  CALCIUM 9.2 9.1   Liver Function Tests:  Recent Labs Lab 10/10/14 1240 10/11/14 0520  AST 58* 52*  ALT 41 34  ALKPHOS 334* 222*  BILITOT 0.9 0.4  PROT 6.8 5.5*  ALBUMIN 3.5 2.7*    Recent Labs Lab 10/10/14 1240  LIPASE 42   No results for input(s): AMMONIA in the last 168  hours. CBC:  Recent Labs Lab 10/10/14 1240 10/11/14 0520  WBC 4.0 14.0*  NEUTROABS 3.2  --   HGB 11.4* 12.0  HCT 35.1* 33.1*  MCV 95.6 88.3  PLT 128* 169   Cardiac Enzymes:  Recent Labs Lab 10/10/14 1240  TROPONINI <0.03   BNP (last 3 results) No results for input(s): BNP in the last 8760 hours.  ProBNP (last 3 results) No results for input(s): PROBNP in the last 8760 hours.  CBG:  Recent Labs Lab 10/11/14 0238 10/11/14 0318 10/11/14 0434 10/11/14 0615 10/11/14 0653  GLUCAP 164* 138* 76 70 118*    Recent Results (from the past 240 hour(s))  MRSA PCR Screening     Status: None   Collection Time: 10/10/14  8:33 PM  Result Value Ref Range Status   MRSA by PCR NEGATIVE NEGATIVE Final    Comment:        The GeneXpert MRSA Assay (FDA approved for NASAL specimens only), is one component of a comprehensive MRSA colonization surveillance program. It is not  intended to diagnose MRSA infection nor to guide or monitor treatment for MRSA infections.      Scheduled Meds: . aspirin EC  81 mg Oral Daily  . calcitRIOL  0.25 mcg Oral Daily  . [START ON 10/12/2014] ferrous sulfate  325 mg Oral Q M,W,F-HD  . gabapentin  300 mg Oral TID  . heparin  5,000 Units Subcutaneous 3 times per day  . insulin aspart  0-9 Units Subcutaneous Q4H  . morphine  4 mg Intravenous Once  . pantoprazole  40 mg Oral BID  . piperacillin-tazobactam (ZOSYN)  IV  2.25 g Intravenous 3 times per day  . sevelamer carbonate  800 mg Oral TID WC  . sodium chloride  3 mL Intravenous Q12H  . sodium chloride  3 mL Intravenous Q12H  . vancomycin  1,250 mg Intravenous Once  . [START ON 10/12/2014] vancomycin  500 mg Intravenous Q M,W,F-HD   Time spent: 40 minutues  Continuous Infusions:   Pamella Pert, MD [facility] Pager [phone]. If 7 PM - 7 AM, please contact night-coverage at [email or web address], password [facility] 10/11/2014, 7:40 AM  LOS: 1 day

## 2014-10-12 DIAGNOSIS — E118 Type 2 diabetes mellitus with unspecified complications: Secondary | ICD-10-CM

## 2014-10-12 LAB — LEGIONELLA ANTIGEN, URINE

## 2014-10-12 LAB — RENAL FUNCTION PANEL
ALBUMIN: 2.2 g/dL — AB (ref 3.5–5.0)
ANION GAP: 8 (ref 5–15)
BUN: 27 mg/dL — ABNORMAL HIGH (ref 6–20)
CO2: 22 mmol/L (ref 22–32)
Calcium: 8.1 mg/dL — ABNORMAL LOW (ref 8.9–10.3)
Chloride: 103 mmol/L (ref 101–111)
Creatinine, Ser: 4.44 mg/dL — ABNORMAL HIGH (ref 0.44–1.00)
GFR calc Af Amer: 12 mL/min — ABNORMAL LOW (ref >60)
GFR, EST NON AFRICAN AMERICAN: 10 mL/min — AB (ref >60)
GLUCOSE: 279 mg/dL — AB (ref 65–99)
PHOSPHORUS: 3 mg/dL (ref 2.5–4.6)
Potassium: 3.7 mmol/L (ref 3.5–5.1)
Sodium: 133 mmol/L — ABNORMAL LOW (ref 135–145)

## 2014-10-12 LAB — CBC
HCT: 27.5 % — ABNORMAL LOW (ref 36.0–46.0)
Hemoglobin: 9.4 g/dL — ABNORMAL LOW (ref 12.0–15.0)
MCH: 31.2 pg (ref 26.0–34.0)
MCHC: 34.2 g/dL (ref 30.0–36.0)
MCV: 91.4 fL (ref 78.0–100.0)
PLATELETS: 111 10*3/uL — AB (ref 150–400)
RBC: 3.01 MIL/uL — ABNORMAL LOW (ref 3.87–5.11)
RDW: 13.1 % (ref 11.5–15.5)
WBC: 8.6 10*3/uL (ref 4.0–10.5)

## 2014-10-12 LAB — GLUCOSE, CAPILLARY
GLUCOSE-CAPILLARY: 166 mg/dL — AB (ref 65–99)
Glucose-Capillary: 166 mg/dL — ABNORMAL HIGH (ref 65–99)
Glucose-Capillary: 280 mg/dL — ABNORMAL HIGH (ref 65–99)
Glucose-Capillary: 239 mg/dL — ABNORMAL HIGH (ref 65–99)

## 2014-10-12 LAB — CANCER ANTIGEN 19-9: CA 19-9: 1 U/mL (ref 0–35)

## 2014-10-12 LAB — URINE CULTURE
CULTURE: NO GROWTH
Colony Count: NO GROWTH

## 2014-10-12 MED ORDER — PENTAFLUOROPROP-TETRAFLUOROETH EX AERO: 1.0000 "application " | INHALATION_SPRAY | CUTANEOUS | Status: DC | PRN

## 2014-10-12 MED ORDER — LIDOCAINE-PRILOCAINE 2.5-2.5 % EX CREA: 1.0000 "application " | TOPICAL_CREAM | CUTANEOUS | Status: DC | PRN

## 2014-10-12 MED ORDER — SODIUM CHLORIDE 0.9 % IV SOLN: 100.0000 mL | INTRAVENOUS | Status: DC | PRN

## 2014-10-12 MED ORDER — HEPARIN SODIUM (PORCINE) 1000 UNIT/ML DIALYSIS: 1000.0000 [IU] | INTRAMUSCULAR | Status: DC | PRN

## 2014-10-12 MED ORDER — NEPRO/CARBSTEADY PO LIQD: 237.0000 mL | ORAL | Status: DC | PRN

## 2014-10-12 MED ORDER — GABAPENTIN 300 MG PO CAPS
300.0000 mg | ORAL_CAPSULE | Freq: Two times a day (BID) | ORAL | Status: DC
Start: 2014-10-12 — End: 2014-10-13
  Administered 2014-10-12 – 2014-10-13 (×2): 300 mg via ORAL
  Filled 2014-10-12 (×4): qty 1

## 2014-10-12 MED ORDER — HEPARIN SODIUM (PORCINE) 1000 UNIT/ML DIALYSIS: 1800.0000 [IU] | Freq: Once | INTRAMUSCULAR | Status: DC

## 2014-10-12 MED ORDER — CLONIDINE HCL 0.1 MG PO TABS
0.1000 mg | ORAL_TABLET | Freq: Every day | ORAL | Status: DC
  Administered 2014-10-12 – 2014-10-14 (×3): 0.1 mg via ORAL
  Filled 2014-10-12 (×6): qty 1

## 2014-10-12 MED ORDER — LIDOCAINE HCL (PF) 1 % IJ SOLN: 5.0000 mL | INTRAMUSCULAR | Status: DC | PRN

## 2014-10-12 MED ORDER — ALTEPLASE 2 MG IJ SOLR: 2.0000 mg | Freq: Once | INTRAMUSCULAR | Status: DC | PRN

## 2014-10-12 NOTE — Progress Notes (Signed)
[name] Progress Note   Subjective: more alert today, all cx's negative so far  Filed Vitals:   10/12/14 0850 10/12/14 0900 10/12/14 1000 10/12/14 1401  BP:  98/78 133/55 143/56  Pulse:  73 70 72  Temp:    98.8 F (37.1 C)  TempSrc:    Oral  Resp:  15 14 18   Height:      Weight:      SpO2: 94% 99% 99% 98%   Exam: Still confused but is interacting today, much more alert No jvd, nodes Chest clear bilat RRR 2/6 SEM no RG Abd soft ntnd no mass or ascites, dec'd BS Ext no ulcer, wounds or gangrene, no edema Neuro moves all extremities, disoriented, calm LUA AV fistula patent   HD: [facility] 4h 61kg 2/2 bath Heparin 1800 LUA AVF Aranesp 25 ug every 2wks Calcitriol 1.25 ug tiw OP labs tsat 52%, ferr 1380, Hb 11 // pth 405, phos 3.5, Ca 9.5  CT abd - fullness at head of pancreas, prob focal pancreatitis  Assessment: 1. AMS / fever/ hypotension - no source of infection yet, cx's negative , CXR/ UA negative. Observing. ?viral infection vs due to severe hyperglycemia (BS 1200 on presentation) 2. Abd pain - CT suggesting focal pancreatitis but no necrosis 3. ESRD on HD 4. HTN on 4 BP meds at home 5. DM uncontrolled - BS 1300 > now 110, per primary team 6. Hx COPD / pulm HTN/ R HF 7. MBD resume meds when more stable and eating  Plan - HD today    Vinson Moselleob Kienan Doublin MD  pager [phone]    cell [phone]  10/12/2014, 2:14 PM     Recent Labs Lab 10/10/14 1240 10/11/14 0520 10/11/14 1050  NA 120* 132* 135  K 3.8 3.2* 3.3*  CL 80* 96* 102  CO2 25 24 24   GLUCOSE 1374* 79 117*  BUN 10 13 14   CREATININE 3.10* 3.17* 3.38*  CALCIUM 9.2 9.1 8.1*  PHOS  --   --  2.3*    Recent Labs Lab 10/10/14 1240 10/11/14 0520 10/11/14 1050  AST 58* 52* 53*  ALT 41 34 28  ALKPHOS 334* 222* 165*  BILITOT 0.9 0.4 0.4  PROT 6.8 5.5* 4.2*  ALBUMIN 3.5 2.7* 2.1*  2.1*    Recent Labs Lab 10/10/14 1240 10/11/14 0520 10/11/14 1050  WBC 4.0 14.0* 11.2*   NEUTROABS 3.2  --   --   HGB 11.4* 12.0 10.0*  HCT 35.1* 33.1* 28.0*  MCV 95.6 88.3 88.9  PLT 128* 169 127*   . antiseptic oral rinse  7 mL Mouth Rinse BID  . aspirin EC  81 mg Oral Daily  . calcitRIOL  0.25 mcg Oral Daily  . calcitRIOL  1.25 mcg Oral Q T,Th,Sa-HD  . [START ON 10/15/2014] calcitRIOL  1.25 mcg Oral Q M,W,F-HD  . cloNIDine  0.1 mg Oral Daily  . gabapentin  300 mg Oral BID  . heparin  5,000 Units Subcutaneous 3 times per day  . imipenem-cilastatin  250 mg Intravenous Q12H  . insulin aspart  0-9 Units Subcutaneous Q4H  . multivitamin  1 tablet Oral QHS  . pantoprazole  40 mg Oral BID  . sevelamer carbonate  800 mg Oral TID WC  . sodium chloride  3 mL Intravenous Q12H  . sodium chloride  3 mL Intravenous Q12H   . sodium chloride 500 mL (10/12/14 1002)  . norepinephrine (LEVOPHED) Adult infusion Stopped (10/11/14 1145)   sodium chloride, albuterol, nitroGLYCERIN,  sodium chloride

## 2014-10-12 NOTE — Consult Note (Signed)
PULMONARY / CRITICAL CARE MEDICINE   Name: Madeline [name] MRN: [id number] DOB: 1955/09/26    ADMISSION DATE:  10/10/2014 CONSULTATION DATE:  6/2  REFERRING MD :  Selena Batten  CHIEF COMPLAINT:  Sepsis/septic shock and AMS  INITIAL PRESENTATION:   59 year old AAF w/ sig h/o ESRD (HD M/W/F EDW ~ 61 Kg from prior records), DM type II,  Diastolic dysfxn w/ estimated PAH (by ECHO); was in usual [facility] until 6/1 while getting scheduled HD developed diffuse abd pain, nausea and vomiting. Presented to ER: dx eval found her to be hyperglycemic w/ glucose 1374, CT abd worrisome for pancreatitis vs. pancreatic mass. 6/2 AM patient experienced low BP with systolics 70-80's and change in mentation. PCCM consulted after SBP remained in the 70's post fluid bolus. Initial exam she has a GCS of 13 and demonstrates ability to protect her airway. Review of her medications over the past 24 hours reveals she received 0.5mg  dilaudid, scheduled clonidine and gabapentin the evening of 6/1. Her scheduled hydralazine was held 6/1 PM. In light of this, one dose of narcan was administered with a positive response. PCCM will follow and continue workup of fever, leukocytosis, and hypotension.    STUDIES / EVENTS  CT abd/pelvis 6/1: swelling of Pancreatic head and Uncinate process w/out evidence of mass OR pancreatic ductal dilatation. Concerning for Pancreatitis. Small HH. Distal esophageal wall thickening suspicious for reflux esophagitis.   6/1: Admitted w/ diffuse abd pain while at HD + N/V. In ER initial blood glucose 1374. CT abd/pelvis worrisome for pancreatitis, but Lipase WNL. Admitted for rx of hyperglycemia and eval of abd pain. Placed on insulin gtt and gentle fluid (2 liters in ER).  6/2: febrile (103.1) and more lethargic; glucose 70; SBP 80s-->down to 70s AFTER 500 ml bolus. Abx started.  PCCM asked to eval. Last dilaudid was 1924 on 6/1. Got 1 amp Narcan on 6/2 at 0920 w/ positive response. 30 ml/kg fluid challenge  given with positive response.   10/11/14 PCCM MD: summary:  lethargic, hypotension, appears so dehydrated, dry, pancreatic head noted, get ca 19-9 as not impressed with lipase or pancreatis as cuase, concern sepsis, stat LA, give 30 cc/kg , place line, start levo to MAP goal 60, repeat perfusion exam, cvp, obtain cvp goal 12, ensure BC, change zosyn to imi, keep vanc, transient bacteremia during HD??    SUBJECTIVE/OVERNIGHT/INTERVAL HX 10/12/14: off pressors x 24h. NEver intubated. RN report at bedside: can likely transfer out of the ICU. Lipase normal. Lactic acid has normaized. Currently SBP 94/78 but mostly 120s sbp   VITAL SIGNS: Temp:  [99 F (37.2 C)-102.5 F (39.2 C)] 99.5 F (37.5 C) (06/03 0816) Pulse Rate:  [61-106] 73 (06/03 0900) Resp:  [12-30] 15 (06/03 0900) BP: (85-168)/(49-115) 98/78 mmHg (06/03 0900) SpO2:  [87 %-100 %] 99 % (06/03 0900) Weight:  [66.9 kg (147 lb 7.8 oz)] 66.9 kg (147 lb 7.8 oz) (06/03 0500) HEMODYNAMICS: CVP:  [5 mmHg-13 mmHg] 13 mmHg VENTILATOR SETTINGS:   INTAKE / OUTPUT:  Intake/Output Summary (Last 24 hours) at 10/12/14 0951 Last data filed at 10/12/14 0900  Gross per 24 hour  Intake    950 ml  Output      1 ml  Net    949 ml    PHYSICAL EXAMINATION: General:  female lying in bed . Chronically unwell and deconditioned Neuro: RASS 0 to -1. Follows all commands. Moves all 4s HEENT: Pupils equal, 5mm sluggish  Cardiovascular: Regular rate and rhythm, no  rubs, gallops, or murmurs Lungs:CTA anterior bilaterally Abdomen: Soft, nondistened, slightly tender to palpation Musculoskeletal:intact Skin:no obvious wounds or rashes.   LABS:   PULMONARY  Recent Labs Lab 10/11/14 1041 10/11/14 1410  PHART 7.349*  --   PCO2ART 44.3  --   PO2ART 67.0*  --   HCO3 24.3*  --   TCO2 26  --   O2SAT 92.0 78.1    CBC  Recent Labs Lab 10/10/14 1240 10/11/14 0520 10/11/14 1050  HGB 11.4* 12.0 10.0*  HCT 35.1* 33.1* 28.0*  WBC 4.0 14.0*  11.2*  PLT 128* 169 127*    COAGULATION  Recent Labs Lab 10/11/14 1050  INR 1.28    CARDIAC   Recent Labs Lab 10/10/14 1240  TROPONINI <0.03   No results for input(s): PROBNP in the last 168 hours.   CHEMISTRY  Recent Labs Lab 10/10/14 1240 10/11/14 0520 10/11/14 1050  NA 120* 132* 135  K 3.8 3.2* 3.3*  CL 80* 96* 102  CO2 25 24 24   GLUCOSE 1374* 79 117*  BUN 10 13 14   CREATININE 3.10* 3.17* 3.38*  CALCIUM 9.2 9.1 8.1*  PHOS  --   --  2.3*   Estimated Creatinine Clearance: 15.7 mL/min (by C-G formula based on Cr of 3.38).   LIVER  Recent Labs Lab 10/10/14 1240 10/11/14 0520 10/11/14 1050  AST 58* 52* 53*  ALT 41 34 28  ALKPHOS 334* 222* 165*  BILITOT 0.9 0.4 0.4  PROT 6.8 5.5* 4.2*  ALBUMIN 3.5 2.7* 2.1*  2.1*  INR  --   --  1.28     INFECTIOUS  Recent Labs Lab 10/11/14 0755 10/11/14 1030 10/11/14 1050  LATICACIDVEN 2.7* 1.8  --   PROCALCITON  --   --  0.79     ENDOCRINE CBG (last 3)   Recent Labs  10/11/14 2335 10/12/14 0412 10/12/14 0814  GLUCAP 145* 166* 166*         IMAGING x48h  - image(s) personally visualized  -   highlighted in bold Ct Abdomen Pelvis W Contrast  10/10/2014   CLINICAL DATA:  Severe abdominal pain for 1 week. Chronic kidney disease on dialysis.  EXAM: CT ABDOMEN AND PELVIS WITH CONTRAST  TECHNIQUE: Multidetector CT imaging of the abdomen and pelvis was performed using the standard protocol following bolus administration of intravenous contrast.  CONTRAST:  OMNIPAQUE IOHEXOL 300 MG/ML  SOLN  COMPARISON:  01/07/2014  FINDINGS: Lower Chest:  Unremarkable.  Hepatobiliary: No masses or other significant abnormality identified. Gallbladder is unremarkable.  Pancreas: Enlargement and heterogeneous attenuation is seen involving the pancreatic head and uncinate process, without evidence of a discrete mass. This is suspicious for mild acute pancreatitis. No evidence of pancreatic necrosis or peripancreatic  fluid collections. No evidence of pancreatic ductal dilatation.  Spleen:  Within normal limits in size and appearance.  Adrenals:  No masses identified.  Kidneys/Urinary Tract:  No evidence of masses or hydronephrosis.  Stomach/Bowel/Peritoneum: A small hiatal hernia is seen and there is also wall thickening of the distal esophagus suspicious for reflux esophagitis. No evidence of bowel wall thickening or dilatation. Normal appendix visualized.  Vascular/Lymphatic: No pathologically enlarged lymph nodes identified. No other significant abnormality visualized.  Reproductive: Prior hysterectomy noted. Adnexal regions are unremarkable in appearance.  Other:  None.  Musculoskeletal:  No suspicious bone lesions identified.  IMPRESSION: Swelling of pancreatic head and uncinate process, without evidence of discrete mass or pancreatic ductal dilatation. This is suspicious for focal pancreatitis. Recommend clinical  correlation with serum amylase and lipase levels.  Small hiatal hernia, with distal esophageal wall thickening suspicious for reflux esophagitis. Consider endoscopy for further evaluation.   Electronically Signed   By: Myles RosenthalJohn  Stahl M.D.   On: 10/10/2014 17:23   Dg Chest Port 1 View  10/11/2014   CLINICAL DATA:  Central line placement.  EXAM: PORTABLE CHEST - 1 VIEW  COMPARISON:  Same day.  FINDINGS: Stable cardiomediastinal silhouette. No pneumothorax or significant pleural effusion is noted. No acute pulmonary disease is noted. Interval placement of right internal jugular catheter line with distal tip at the expected position of the cavoatrial junction. Bony thorax is intact.  IMPRESSION: Interval placement of right internal jugular catheter line with distal tip at expected position of cavoatrial junction. No pneumothorax is noted.   Electronically Signed   By: Lupita RaiderJames  Green Jr, M.D.   On: 10/11/2014 13:25   Dg Chest Port 1 View  10/11/2014   CLINICAL DATA:  Fevers  EXAM: PORTABLE CHEST - 1 VIEW  COMPARISON:   01/07/2014  FINDINGS: The heart size and mediastinal contours are within normal limits. Both lungs are clear. The visualized skeletal structures are unremarkable.  IMPRESSION: No active disease.   Electronically Signed   By: Alcide CleverMark  Lukens M.D.   On: 10/11/2014 07:07        ASSESSMENT / PLAN:  PULMONARY  A:   Possible evolving layering pleural effusion (left)-->developed AFTER fluid resuscitation    - no distress P:   -Maintain O2 Sats >92%  CXR prn   CARDIOVASCULAR  A:  possible sepsis/ septic shock vs pancreatitis/ SIRS c/b narcotics, antihypertensives +/- volume depletion.  Diastolic HF  - off pressors x 16X24h. BP/HR good. On coreg, norvasc, imdur, clonidine at home  P:  Slowly reintroduce bp drugs; will start with clonidine 0.1mg  daily   RENAL A:  ESRD (HD dependent)   P:   [facility] renal  GASTROINTESTINAL  Recent Labs Lab 10/10/14 1240 10/11/14 0520 10/11/14 1050  AST 58* 52* 53*  ALT 41 34 28  ALKPHOS 334* 222* 165*  BILITOT 0.9 0.4 0.4  PROT 6.8 5.5* 4.2*  ALBUMIN 3.5 2.7* 2.1*  2.1*  INR  --   --  1.28    A:   ABD pain NOS, (CT concerning for pancreatitis vs pancreatic mass) Elevated aminotransferases - very mild and trending down. Lipase normal  P:   -Might need GI workup - TRH to decide - reintroduce renal diet  HEMATOLOGIC  A:   No acute problem ; anemia of critical and chronic disease  P:  -Monitor daily CBC to trend leukocytosis -Heparin for DVT chemoprophylaxis  INFECTIOUS A:   SIRS vs sepsis: source unclear... Not convinced that pancreatitis is the driving issue. ? Bacterial translocation during HD.Marland Kitchen. She is at risk for bacteremia.  MRSA PCR negative  P:   -dc  Vancomycin 6/2>>>10/12/14 Imipenem (pharmacy to dose d/t renal impairment)  6/2>>>  TRH to decide stop date BCx2:6/2>>> UC :6/2>>>   ENDOCRINE A:   DM2 Hyperglycemia   - sugars running fine   P:   --SSI per protocol - Lantus reintroduction per  TRH depending on diet   NEUROLOGIC A:   Acute Metabolic encephalopathy: suspect multifactorial: narcotics, hypotension +/- sepsis    - improved largely. Some mild drowsiness but oriented  P:   -RASS goal: 0 - Decrease gabapentin dose - relative CI in renal failure - [facility] to sort this out with renal -Close observation  of mental status  -Hold all sedating medications      FAMILY  - Updates: 10/11/14/  Sister at bedside. Updated on status and noted that we are continuing to work up her source of hypotension. 10/12/14: No family at bedside.   - Inter-disciplinary family meet or Palliative Care meeting due by: 6/9    GLOBAL 10/12/14: Move to renal tele bed. TRH primary from 10/13/14 and PCCM off. D/w Dr Joseph Art      Dr. Kalman Shan, M.D., [facility].C.P Pulmonary and Critical Care Medicine Staff Physician [facility] Pager: [phone], If no answer or between  15:00h - 7:00h: call [phone]  10/12/2014 10:08 AM

## 2014-10-12 NOTE — Progress Notes (Signed)
Patient being transferred off unit with IJ CVC, per Marchelle Gearingamaswamy, MD order.

## 2014-10-12 NOTE — Care Management Note (Signed)
Case Management Note  Patient Details  Name: Brock BadDorine Gorney MRN: [id number] Date of Birth: Oct 20, 1955  Subjective/Objective:    Transferred to 6 E10 on 10/12/2014.                 Action/Plan:    Expected Discharge Date:                  Expected Discharge Plan:  Home/Self Care (From Home with Daughter [name])  In-House Referral:     Discharge planning Services     Post Acute Care Choice:    Choice offered to:     DME Arranged:    DME Agency:     HH Arranged:    HH Agency:     Status of Service:  In process, will continue to follow  Medicare Important Message Given:    Date Medicare IM Given:    Medicare IM give by:    Date Additional Medicare IM Given:    Additional Medicare Important Message give by:     If discussed at Long Length of Stay Meetings, dates discussed:    Additional Comments:  [name], Linsay Vogt M, RN 10/12/2014, 2:04 PM

## 2014-10-12 NOTE — Progress Notes (Signed)
Admission note:  Arrival Method: Patient arrived in bed with the staff accompanying. Mental Orientation:  Alert and oriented X 4. Telemetry: 6E-10, CCMD notified. NSR Assessment: See doc flow sheets. IV: Right IJ and RH peripheral IV. Pain: Denies any pain. Safety Measures: Bed position low, call light and phone within reach. 6700 Orientation: Patient has been oriented to the unit, staff and to the room.  Skin assessment unable to do, informed the upcoming nurse.

## 2014-10-13 DIAGNOSIS — R279 Unspecified lack of coordination: Secondary | ICD-10-CM

## 2014-10-13 DIAGNOSIS — R109 Unspecified abdominal pain: Secondary | ICD-10-CM

## 2014-10-13 DIAGNOSIS — N186 End stage renal disease: Secondary | ICD-10-CM

## 2014-10-13 DIAGNOSIS — Z992 Dependence on renal dialysis: Secondary | ICD-10-CM

## 2014-10-13 DIAGNOSIS — R509 Fever, unspecified: Secondary | ICD-10-CM

## 2014-10-13 LAB — GLUCOSE, CAPILLARY
GLUCOSE-CAPILLARY: 194 mg/dL — AB (ref 65–99)
Glucose-Capillary: 249 mg/dL — ABNORMAL HIGH (ref 65–99)
Glucose-Capillary: 300 mg/dL — ABNORMAL HIGH (ref 65–99)
Glucose-Capillary: 313 mg/dL — ABNORMAL HIGH (ref 65–99)
Glucose-Capillary: 415 mg/dL — ABNORMAL HIGH (ref 65–99)

## 2014-10-13 LAB — AMMONIA: AMMONIA: 44 umol/L — AB (ref 9–35)

## 2014-10-13 MED ORDER — LEVETIRACETAM 500 MG PO TABS
500.0000 mg | ORAL_TABLET | Freq: Every day | ORAL | Status: DC
  Administered 2014-10-13 – 2014-10-16 (×4): 500 mg via ORAL
  Filled 2014-10-13 (×4): qty 1

## 2014-10-13 MED ORDER — INSULIN ASPART 100 UNIT/ML ~~LOC~~ SOLN
0.0000 [IU] | Freq: Three times a day (TID) | SUBCUTANEOUS | Status: DC
  Administered 2014-10-13: 5 [IU] via SUBCUTANEOUS
  Administered 2014-10-13 – 2014-10-14 (×4): 9 [IU] via SUBCUTANEOUS
  Administered 2014-10-15: 2 [IU] via SUBCUTANEOUS
  Administered 2014-10-15: 3 [IU] via SUBCUTANEOUS
  Administered 2014-10-16: 2 [IU] via SUBCUTANEOUS

## 2014-10-13 MED ORDER — INSULIN ASPART 100 UNIT/ML ~~LOC~~ SOLN
3.0000 [IU] | Freq: Three times a day (TID) | SUBCUTANEOUS | Status: DC
  Administered 2014-10-13 – 2014-10-14 (×3): 3 [IU] via SUBCUTANEOUS

## 2014-10-13 NOTE — Progress Notes (Addendum)
[facility] PROGRESS NOTE  Madeline [name] ZOX:[id number] DOB: Sep 11, 1955 DOA: 10/10/2014 PCP: Pamelia Hoit, MD   PCCM Transfer to [facility] 6/4 Assessment/Plan: 1. SIRS with Fever/Hypotension on 6/2, necessitating ICU transfer and temporary pressors -source unclear, CT abd with mild panc fullness and possible focal pancreatitis with normal Lipase -was started on IV Imipenem 6/2, Blood cx negative; suspect Viral or pancreatic inflammation as possible etiology of fever in absence of other source. -random cortisol 19 -since clinically improved, stable vitals, negative cultures will stop Imipenem today and monitor  2. Encephalopathy -metabolic due to HONK , CBG 1300 on admission, neurontin -improved, but was having upper extremity numbness and jerky movts prior to admission for 3-4weeks -d/w Renal Dr.Schertz, will ask Neuro to eval, mentation improved compared to 6/2 still with asterixes  3. Abd pain -on admission due to mild pancreatitis resolved -Ca 19-9 normal, needs GI FU for idiopathic pancreatitis  4. Brittle DM -takes Lantus and humalog at home -Hbaic pending -continue Novolog meal coverage and sensitive SSI for now -DM coordinator consult  4. HTN -takes multiple meds at home, now on clonidine  5. H/o COPD/Pulm HTN and Right heart failure -stable  6. Anemia of chronic disease  DVT proph: Hep SQ  Code Status: Full Code Family Communication: family at bedside Disposition Plan: Home when stable   Consultants:  Renal  HPI/Subjective: Feels fine, no abd pain, ate breakfast  Objective: Filed Vitals:   10/13/14 0957  BP: 151/62  Pulse: 68  Temp: 99.1 F (37.3 C)  Resp: 18    Intake/Output Summary (Last 24 hours) at 10/13/14 1202 Last data filed at 10/13/14 0900  Gross per 24 hour  Intake    830 ml  Output      0 ml  Net    830 ml   Filed Weights   10/12/14 1520 10/12/14 1859 10/13/14 0616  Weight: 67.6 kg (149 lb 0.5 oz) 64.8 kg (142 lb 13.7 oz)  63.902 kg (140 lb 14.1 oz)    Exam:   General:  AAOx3, no distress  Cardiovascular: S1S2/RRR  Respiratory: CTAB  Abdomen: soft, NT, BS present  Musculoskeletal: no edema c.c  Left arm with AVF   Data Reviewed: Basic Metabolic Panel:  Recent Labs Lab 10/10/14 1240 10/11/14 0520 10/11/14 1050 10/12/14 1530  NA 120* 132* 135 133*  K 3.8 3.2* 3.3* 3.7  CL 80* 96* 102 103  CO2 GLUCOSE 1374* 79 117* 279*  BUN 27*  CREATININE 3.10* 3.17* 3.38* 4.44*  CALCIUM 9.2 9.1 8.1* 8.1*  PHOS  --   --  2.3* 3.0   Liver Function Tests:  Recent Labs Lab 10/10/14 1240 10/11/14 0520 10/11/14 1050 10/12/14 1530  AST 58* 52* 53*  --   ALT 41 34 28  --   ALKPHOS 334* 222* 165*  --   BILITOT 0.9 0.4 0.4  --   PROT 6.8 5.5* 4.2*  --   ALBUMIN 3.5 2.7* 2.1*  2.1* 2.2*    Recent Labs Lab 10/10/14 1240  LIPASE 42   No results for input(s): AMMONIA in the last 168 hours. CBC:  Recent Labs Lab 10/10/14 1240 10/11/14 0520 10/11/14 1050 10/12/14 1530  WBC 4.0 14.0* 11.2* 8.6  NEUTROABS 3.2  --   --   --   HGB 11.4* 12.0 10.0* 9.4*  HCT 35.1* 33.1* 28.0* 27.5*  MCV 95.6 88.3 88.9 91.4  PLT 128* 169 127* 111*   Cardiac Enzymes:  Recent  Labs Lab 10/10/14 1240  TROPONINI <0.03   BNP (last 3 results) No results for input(s): BNP in the last 8760 hours.  ProBNP (last 3 results) No results for input(s): PROBNP in the last 8760 hours.  CBG:  Recent Labs Lab 10/12/14 2219 10/13/14 0007 10/13/14 0411 10/13/14 0757 10/13/14 1140  GLUCAP 239* 249* 313* 194* 300*    Recent Results (from the past 240 hour(s))  MRSA PCR Screening     Status: None   Collection Time: 10/10/14  8:33 PM  Result Value Ref Range Status   MRSA by PCR NEGATIVE NEGATIVE Final    Comment:        The GeneXpert MRSA Assay (FDA approved for NASAL specimens only), is one component of a comprehensive MRSA colonization surveillance program. It is not intended to  diagnose MRSA infection nor to guide or monitor treatment for MRSA infections.   Culture, blood (routine x 2)     Status: None (Preliminary result)   Collection Time: 10/11/14  7:55 AM  Result Value Ref Range Status   Specimen Description BLOOD RIGHT WRIST  Final   Special Requests BOTTLES DRAWN AEROBIC ONLY 4CC  Final   Culture   Final           BLOOD CULTURE RECEIVED NO GROWTH TO DATE CULTURE WILL BE HELD FOR 5 DAYS BEFORE ISSUING A FINAL NEGATIVE REPORT Performed at [facility]    Report Status PENDING  Incomplete  Culture, Urine     Status: None   Collection Time: 10/11/14  8:23 AM  Result Value Ref Range Status   Specimen Description URINE, CATHETERIZED  Final   Special Requests NONE  Final   Colony Count NO GROWTH Performed at [facility]   Final   Culture NO GROWTH Performed at [facility]   Final   Report Status 10/12/2014 FINAL  Final  Culture, blood (routine x 2)     Status: None (Preliminary result)   Collection Time: 10/11/14 12:00 PM  Result Value Ref Range Status   Specimen Description BLOOD RIGHT NECK  Final   Special Requests BOTTLES DRAWN AEROBIC AND ANAEROBIC 10CCS   Final   Culture   Final           BLOOD CULTURE RECEIVED NO GROWTH TO DATE CULTURE WILL BE HELD FOR 5 DAYS BEFORE ISSUING A FINAL NEGATIVE REPORT Note: Culture results may be compromised due to an excessive volume of blood received in culture bottles. Performed at [facility]    Report Status PENDING  Incomplete     Studies: Dg Chest Port 1 View  10/11/2014   CLINICAL DATA:  Central line placement.  EXAM: PORTABLE CHEST - 1 VIEW  COMPARISON:  Same day.  FINDINGS: Stable cardiomediastinal silhouette. No pneumothorax or significant pleural effusion is noted. No acute pulmonary disease is noted. Interval placement of right internal jugular catheter line with distal tip at the expected position of the cavoatrial junction. Bony thorax is intact.  IMPRESSION:  Interval placement of right internal jugular catheter line with distal tip at expected position of cavoatrial junction. No pneumothorax is noted.   Electronically Signed   By: Lupita Raider, M.D.   On: 10/11/2014 13:25    Scheduled Meds: . antiseptic oral rinse  7 mL Mouth Rinse BID  . aspirin EC  81 mg Oral Daily  . calcitRIOL  0.25 mcg Oral Daily  . calcitRIOL  1.25 mcg Oral Q T,Th,Sa-HD  . [START ON 10/15/2014] calcitRIOL  1.25 mcg Oral Q M,W,F-HD  . cloNIDine  0.1 mg Oral Daily  . gabapentin  300 mg Oral BID  . heparin  5,000 Units Subcutaneous 3 times per day  . insulin aspart  0-9 Units Subcutaneous TID WC  . insulin aspart  3 Units Subcutaneous TID WC  . multivitamin  1 tablet Oral QHS  . pantoprazole  40 mg Oral BID  . sevelamer carbonate  800 mg Oral TID WC  . sodium chloride  3 mL Intravenous Q12H  . sodium chloride  3 mL Intravenous Q12H   Continuous Infusions:  Antibiotics Given (last 72 hours)    Date/Time Action Medication Dose Rate   10/11/14 1026 Given   vancomycin (VANCOCIN) 1,250 mg in sodium chloride 0.9 % 250 mL IVPB 1,250 mg 166.7 mL/hr   10/11/14 1200 Given   imipenem-cilastatin (PRIMAXIN) 250 mg in sodium chloride 0.9 % 100 mL IVPB 250 mg 200 mL/hr   10/11/14 2212 Given   imipenem-cilastatin (PRIMAXIN) 250 mg in sodium chloride 0.9 % 100 mL IVPB 250 mg 200 mL/hr   10/12/14 1054 Given   imipenem-cilastatin (PRIMAXIN) 250 mg in sodium chloride 0.9 % 100 mL IVPB 250 mg 200 mL/hr   10/12/14 2251 Given   imipenem-cilastatin (PRIMAXIN) 250 mg in sodium chloride 0.9 % 100 mL IVPB 250 mg 200 mL/hr   10/13/14 1046 Given   imipenem-cilastatin (PRIMAXIN) 250 mg in sodium chloride 0.9 % 100 mL IVPB 250 mg 200 mL/hr      Active Problems:   DM (diabetes mellitus)   Hyperglycemia   ESRD on hemodialysis   Anemia   Abdominal pain   Encounter for central line placement   Fever    Time spent: 35min    K[facility] Fnd Hosp - Richmond CampusJOSEPH,Shaquon Gropp  [facility] Pager [phone]. If  7PM-7AM, please contact night-coverage at [email or web address], password [email or web address] 10/13/2014, 12:02 PM  LOS: 3 days

## 2014-10-13 NOTE — Consult Note (Addendum)
Neurology Consultation Reason for Consult: Asterixis Referring Physician: Mitchel Honour  CC: jerk movements  History is obtained from:patient  HPI: Madeline [name] is a 59 y.o. female who has been complaining for three weeks of jerky movements. She states that she will reach for things,but her hadn will drop before she gets there. She has also been falling due to her legs suddenly giving out. She has been frequently thirsty and checking blood sugars at home with readings in the 500s - 600s. She was found to be febrile durring this admission. She had been having abdomianl pain prior to admission, and question of pancreatitis on iamging but lipase was normal. Also has been febrile.   She was altered on admission with a glucose of 1300.    ROS: A 14 point ROS was performed and is negative except as noted in the HPI.   Past Medical History  Diagnosis Date  . CHF (congestive heart failure)     a. HFpEF with RHF/anasarca/pHTN.  . COPD (chronic obstructive pulmonary disease)   . Coronary artery disease     a. Per [place] records: NSTEMI 01/2012, tx medically given ARF but suspected CAD. b. Stress test 12/16/11 reported w/o ischemia.  . Hypertension   . Diabetes mellitus   . OSA (obstructive sleep apnea)     a. Pt reported used to use CPAP in [place], but "ran out" when came to [facility].  Marland Kitchen CKD (chronic kidney disease), stage III     a. Per [place] records: h/o ARF after CTA that ruled out PE.  Marland Kitchen Pulmonary hypertension     a. [facility] 02/28/13: mod pulm HTN with normal PVR suggestive of predominantly pulmonary venous HTN.  . Right heart failure   . Anasarca     a. Per [place] records - due to pulm HTN with R HF.   Marland Kitchen Aseptic meningitis     a. 09/2012: adm in [place] for metabolic encephalopathy, oliguric tubular necrosis, anemia, HTN, possible CVA, HHNKA.  Marland Kitchen CVA (cerebral infarction)     a. Per [place] records, "possible CVA" 09/2012 but MRI reportedly negative.  . Abnormal Doppler ultrasound of carotid artery     a. Per [place] records: <50%  LICA.  Marland Kitchen Renal insufficiency     Family History: No hx sz  Social History: Tob: denies  Exam: Current vital signs: BP 154/59 mmHg  Pulse 66  Temp(Src) 99.5 F (37.5 C) (Oral)  Resp 16  Ht 5' (1.524 m)  Wt 63.902 kg (140 lb 14.1 oz)  BMI 27.51 kg/m2  SpO2 99% Vital signs in last 24 hours: Temp:  [98.8 F (37.1 C)-100.5 F (38.1 C)] 99.5 F (37.5 C) (06/04 1637) Pulse Rate:  [66-73] 66 (06/04 1637) Resp:  [16-18] 16 (06/04 1637) BP: (128-154)/(59-64) 154/59 mmHg (06/04 1637) SpO2:  [92 %-99 %] 99 % (06/04 1637) Weight:  [63.902 kg (140 lb 14.1 oz)] 63.902 kg (140 lb 14.1 oz) (06/04 0616)   Physical Exam  Constitutional: Appears well-developed and well-nourished.  Psych: Affect appropriate to situation Eyes: No scleral injection HENT: scar on the right lip.  Head: Normocephalic.  Cardiovascular: Normal rate and regular rhythm.  Respiratory: Effort normal and breath sounds normal to anterior ascultation GI: Soft.  No distension. There is no tenderness.  Skin: WDI  Neuro: Mental Status: Patient is awake, alert, oriented to person, place, month, year, and situation. Patient is able to give a clear and coherent history. No signs of aphasia or neglect Cranial Nerves: II: Visual Fields are full. Pupils  are equal, round, and reactive to light.   III,IV, VI: EOMI without ptosis or diploplia.  V: Facial sensation is symmetric to temperature VII: Facial movement is mildly diminished in the right face, which I suspect is due to old injury(family confirms baseline smile) VIII: hearing is intact to voice X: Uvula elevates symmetrically XI: Shoulder shrug is symmetric. XII: tongue is midline without atrophy or fasciculations.  Motor: Tone is normal. Bulk is normal. 5/5 strength was present in all four extremities. She has mild asterixis throughout Sensory: Sensation is symmetric to light touch and temperature in the arms, decreased in the right leg circumferentially below  the hip. (old deficit) Deep Tendon Reflexes: 1+ and symmetric in the biceps and 0 at patellae.  Cerebellar: FNF impaired due to asterixis.      I have reviewed labs in epic and the results pertinent to this consultation are: BG 1374 on admission.  Elevated creatinine(on HD)  Impression: 59 yo F with a 3 week history of asterixis in the setting of multiple metabolic derangements including febrile illness, markedly elevated glucose, renal failure. I feel that these movements are related to these abnormalities, but given their continued impact on her, especially falls, would be reasonable to try to pursue therapy. If any agent is not helping after 2 -3 days, it can be discontinued. Also, this would not need to be a long term therapy and she should discontinue it once her metabolic process have been improved and stable for a week or two. HONK state more tahn adequate for explanation of her now resolved encephalopathy.   I have no reason to suspect her fever is related to CNS pathology.   Hyperammonemia can also be associated with these movements and should be assessed.   Recommendations: 1) keppra 500mg  daily, if still with asterixis after 2 -3 days, could increase to 1gm daily. If no benefit from that after 2 -3  days, then could discontinue keppra and use depakote 500mg  BID(if no hyperammonemia). If there is still no benefit, then may need to consider working on a safety plan with PT.  2) will send ammonia.  3) Please call if there are any further questions or concerns.    Ritta SlotMcNeill Lexie Morini, MD [facility] [phone]  If 7pm- 7am, please [place] [facility] on call as listed in AMION.

## 2014-10-13 NOTE — Progress Notes (Signed)
Subjective:  No cos /tolerated HD yest on sched./ appears  Back to baseline MS Talkative with Family in room Ox3  Objective Vital signs in last 24 hours: Filed Vitals:   10/12/14 1859 10/12/14 2220 10/13/14 0616 10/13/14 0957  BP: 174/79 129/59 128/64 151/62  Pulse: 73 73 70 68  Temp: 98.8 F (37.1 C) 100.5 F (38.1 C) 98.8 F (37.1 C) 99.1 F (37.3 C)  TempSrc: Oral Oral Oral Oral  Resp: 16 16 16 18   Height:      Weight: 64.8 kg (142 lb 13.7 oz)  63.902 kg (140 lb 14.1 oz)   SpO2: 99% 92% 96% 99%   Weight change: 2.7 kg (5 lb 15.2 oz)  Physical Exam: General: Alert OX3 , now back to baseline MS ,cooperative Heart: RRR soft 1/6 sem lsb no rub or gallop Lungs: CTA bilat Abdomen: bs  Pos soft NT, ND Extremities:  No pedal edema Dialysis Access:  Pos bruit LUA AVF    OP HD: MWF [facility] 4h 61kg 2/2 bath Heparin 1800 LUA AVF Aranesp 25 ug every 2wks Calcitriol 1.25 ug tiw OP labs tsat 52%, ferr 1380, Hb 11 // pth 405, phos 3.5, Ca 9.5  Problem/Plan: 1. AMS / fever/ hypotension - MS back to baseline  today/no source of infection yet, cx's negative , CXR/ UA negative. Observing. ?viral infection vs due to severe hyperglycemia (BS 1200 on presentation) and meds (opiates on drug screen) Family describes illness with UE numbness and myoclonus for last 3-4 weeks at home. Was seen by PCP two wks ago and neurontin rx'd.  Did not get better then came in with abd pain, BS 1300. Decompensated rapidly with AMS, became obtunded, febrile to 103 early this hospital stay. Gradually has improved, but all cx's negative and though MS is better she still has the myoclonus/ asterixis with testing.  Family was planning OP neuro referral and are requesting same here. Agree not well explained what is causing encephalopathy/ asterixis and fevers.  Have d/w primary re neuro consult. Will d/c neurontin also, which she has still been getting about once a day.   2. Abd pain - CT suggesting focal pancreatitis  but no necrosis/ no abd pain . 3. ESRD on HD- MWF On schedule ([facility])  4. HTN -on 4 [facility]  daily bp = 151/62 with pt ~~ 4 kg >edw 99%o2 sat 5. DM uncontrolled - BS 1300 >279 yest ,RX per primary team ( [facility] with Glucoses 700S' and HGB A 1c =13.12 August 2014/ prior 12.7 Jan . ) She tells me PCP follows her for BS control 6. 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  Vinson Moselleob Kennia Vanvorst MD pager [phone]    cell [phone] 10/13/2014, 1:49 PM      Labs: Basic Metabolic Panel:  Recent Labs Lab 10/11/14 0520 10/11/14 1050 10/12/14 1530  NA 132* 135 133*  K 3.2* 3.3* 3.7  CL 96* 102 103  CO2 24 24 22   GLUCOSE 79 117* 279*  BUN 13 14 27*  CREATININE 3.17* 3.38* 4.44*  CALCIUM 9.1 8.1* 8.1*  PHOS  --  2.3* 3.0   Liver Function Tests:  Recent Labs Lab 10/10/14  1240 10/11/14 0520 10/11/14 1050 10/12/14 1530  AST 58* 52* 53*  --   ALT 41 34 28  --   ALKPHOS 334* 222* 165*  --   BILITOT 0.9 0.4 0.4  --   PROT 6.8 5.5* 4.2*  --   ALBUMIN 3.5 2.7* 2.1*  2.1* 2.2*    Recent Labs Lab 10/10/14 1240  LIPASE 42   No results for input(s): AMMONIA in the last 168 hours. CBC:  Recent Labs Lab 10/10/14 1240 10/11/14 0520 10/11/14 1050 10/12/14 1530  WBC 4.0 14.0* 11.2* 8.6  NEUTROABS 3.2  --   --   --   HGB 11.4* 12.0 10.0* 9.4*  HCT 35.1* 33.1* 28.0* 27.5*  MCV 95.6 88.3 88.9 91.4  PLT 128* 169 127* 111*   Cardiac Enzymes:  Recent Labs Lab 10/10/14 1240  TROPONINI <0.03   CBG:  Recent Labs Lab 10/12/14 1359 10/12/14 2219 10/13/14 0007 10/13/14 0411 10/13/14 0757  GLUCAP 280* 239* 249* 313* 194*    Studies/Results: Dg Chest Port 1 View  10/11/2014   CLINICAL DATA:  Central  line placement.  EXAM: PORTABLE CHEST - 1 VIEW  COMPARISON:  Same day.  FINDINGS: Stable cardiomediastinal silhouette. No pneumothorax or significant pleural effusion is noted. No acute pulmonary disease is noted. Interval placement of right internal jugular catheter line with distal tip at the expected position of the cavoatrial junction. Bony thorax is intact.  IMPRESSION: Interval placement of right internal jugular catheter line with distal tip at expected position of cavoatrial junction. No pneumothorax is noted.   Electronically Signed   By: Lupita Raider, M.D.   On: 10/11/2014 13:25   Medications:   . antiseptic oral rinse  7 mL Mouth Rinse BID  . aspirin EC  81 mg Oral Daily  . calcitRIOL  0.25 mcg Oral Daily  . calcitRIOL  1.25 mcg Oral Q T,Th,Sa-HD  . [START ON 10/15/2014] calcitRIOL  1.25 mcg Oral Q M,W,F-HD  . cloNIDine  0.1 mg Oral Daily  . gabapentin  300 mg Oral BID  . heparin  5,000 Units Subcutaneous 3 times per day  . imipenem-cilastatin  250 mg Intravenous Q12H  . insulin aspart  0-9 Units Subcutaneous Q4H  . multivitamin  1 tablet Oral QHS  . pantoprazole  40 mg Oral BID  . sevelamer carbonate  800 mg Oral TID WC  . sodium chloride  3 mL Intravenous Q12H  . sodium chloride  3 mL Intravenous Q12H

## 2014-10-13 NOTE — Progress Notes (Signed)
Inpatient Diabetes Program Recommendations  AACE/ADA: New Consensus Statement on Inpatient Glycemic Control (2013)  Target Ranges:  Prepandial:   less than 140 mg/dL      Peak postprandial:   less than 180 mg/dL (1-2 hours)      Critically ill patients:  140 - 180 mg/dL  Results for [name], Madeline [name] (MRN [id number]) as of 10/13/2014 13:27  Ref. Range 10/12/2014 22:19 10/13/2014 00:07 10/13/2014 04:11 10/13/2014 07:57 10/13/2014 11:40  Glucose-Capillary Latest Ref Range: 65-99 mg/dL 188239 (H) 416249 (H) 606313 (H) 194 (H) 300 (H)   Diabetes history: DM 2 Outpatient Diabetes medications: lantus 10 units QHS, Novolog 3-10 units TID Current orders for Inpatient glycemic control: Novolog 0-9 units TID, Novolog 3 units TID meal coverage.  Inpatient Diabetes Program Recommendations  Insulin - Basal: Patient takes 10 units of basal insulin at home. Please consider Lantus 8 units Q24hrs. Correction (SSI):  Also consider Novolog 0-5 units QHS for bedtime coverage. If glucose continues to be elevated at meal times, an increase in correction scale should help.  Thanks,  Christena DeemShannon Durene Dodge RN, MSN, [facility] Inpatient Diabetes Coordinator Team Pager [phone]

## 2014-10-14 LAB — COMPREHENSIVE METABOLIC PANEL
ALBUMIN: 2.2 g/dL — AB (ref 3.5–5.0)
ALT: 24 U/L (ref 14–54)
ANION GAP: 10 (ref 5–15)
AST: 55 U/L — AB (ref 15–41)
Alkaline Phosphatase: 202 U/L — ABNORMAL HIGH (ref 38–126)
BUN: 24 mg/dL — AB (ref 6–20)
CALCIUM: 8.5 mg/dL — AB (ref 8.9–10.3)
CO2: 26 mmol/L (ref 22–32)
Chloride: 97 mmol/L — ABNORMAL LOW (ref 101–111)
Creatinine, Ser: 3.67 mg/dL — ABNORMAL HIGH (ref 0.44–1.00)
GFR calc Af Amer: 15 mL/min — ABNORMAL LOW (ref >60)
GFR, EST NON AFRICAN AMERICAN: 13 mL/min — AB (ref >60)
GLUCOSE: 609 mg/dL — AB (ref 65–99)
POTASSIUM: 4.2 mmol/L (ref 3.5–5.1)
Sodium: 133 mmol/L — ABNORMAL LOW (ref 135–145)
Total Bilirubin: 0.9 mg/dL (ref 0.3–1.2)
Total Protein: 5.2 g/dL — ABNORMAL LOW (ref 6.5–8.1)

## 2014-10-14 LAB — CBC
HEMATOCRIT: 30.5 % — AB (ref 36.0–46.0)
Hemoglobin: 10.2 g/dL — ABNORMAL LOW (ref 12.0–15.0)
MCH: 30.9 pg (ref 26.0–34.0)
MCHC: 33.4 g/dL (ref 30.0–36.0)
MCV: 92.4 fL (ref 78.0–100.0)
PLATELETS: 102 10*3/uL — AB (ref 150–400)
RBC: 3.3 MIL/uL — AB (ref 3.87–5.11)
RDW: 12.7 % (ref 11.5–15.5)
WBC: 5.1 10*3/uL (ref 4.0–10.5)

## 2014-10-14 LAB — GLUCOSE, CAPILLARY
GLUCOSE-CAPILLARY: 596 mg/dL — AB (ref 65–99)
GLUCOSE-CAPILLARY: 576 mg/dL — AB (ref 65–99)
Glucose-Capillary: 520 mg/dL — ABNORMAL HIGH (ref 65–99)
Glucose-Capillary: 373 mg/dL — ABNORMAL HIGH (ref 65–99)
Glucose-Capillary: 351 mg/dL — ABNORMAL HIGH (ref 65–99)
Glucose-Capillary: 233 mg/dL — ABNORMAL HIGH (ref 65–99)

## 2014-10-14 MED ORDER — SODIUM CHLORIDE 0.9 % IJ SOLN
10.0000 mL | INTRAMUSCULAR | Status: DC | PRN
  Administered 2014-10-14 – 2014-10-16 (×10): 10 mL
  Filled 2014-10-14 (×10): qty 40

## 2014-10-14 MED ORDER — INSULIN GLARGINE 100 UNIT/ML ~~LOC~~ SOLN
10.0000 [IU] | Freq: Every day | SUBCUTANEOUS | Status: DC
  Administered 2014-10-14 – 2014-10-15 (×2): 10 [IU] via SUBCUTANEOUS
  Filled 2014-10-14 (×2): qty 0.1

## 2014-10-14 MED ORDER — INSULIN ASPART 100 UNIT/ML ~~LOC~~ SOLN
4.0000 [IU] | Freq: Three times a day (TID) | SUBCUTANEOUS | Status: DC
  Administered 2014-10-14 – 2014-10-15 (×4): 4 [IU] via SUBCUTANEOUS

## 2014-10-14 MED ORDER — OXYCODONE HCL 5 MG PO TABS
5.0000 mg | ORAL_TABLET | Freq: Four times a day (QID) | ORAL | Status: DC | PRN
  Administered 2014-10-14 – 2014-10-15 (×2): 5 mg via ORAL
  Filled 2014-10-14 (×2): qty 1

## 2014-10-14 MED ORDER — SODIUM CHLORIDE 0.9 % IJ SOLN
10.0000 mL | Freq: Two times a day (BID) | INTRAMUSCULAR | Status: DC
  Administered 2014-10-14: 10 mL
  Administered 2014-10-15: 30 mL

## 2014-10-14 MED ORDER — NEPRO/CARBSTEADY PO LIQD
237.0000 mL | Freq: Two times a day (BID) | ORAL | Status: DC
  Administered 2014-10-14 (×2): 237 mL via ORAL

## 2014-10-14 NOTE — Evaluation (Signed)
Occupational Therapy Evaluation Patient Details Name: Madeline [name] MRN: [id number] DOB: 1956-03-18 Today's Date: 10/14/2014    History of Present Illness Patient is a 59 y.o. female admitted 10/10/14 with hyperglycemia (1374), N/V, abdominal pain, metabolic encephalopathy. Patient with HONK, pancreatitis, asterixis.  On 10/11/14 patient with hypotension and SIRS, transferred to ICU.  Now on Renal unit.  PMH:  ESRD on HD MWF, DM, CHF, COPD, CAD, HTN, CVA, falls   Clinical Impression   Pt admitted with above. Pt receiving assist with ADLs, PTA. Feel pt will benefit from acute OT to increase independence and strength prior to d/c. Recommending HHOT upon d/c.     Follow Up Recommendations  Home health OT;Supervision/Assistance - 24 hour    Equipment Recommendations  None recommended by OT    Recommendations for Other Services       Precautions / Restrictions Precautions Precautions: Fall Precaution Comments: h/o falls Restrictions Weight Bearing Restrictions: No      Mobility Bed Mobility Overal bed mobility: Needs Assistance Bed Mobility: Supine to Sit; Sit to Supine     Supine to sit: Supervision Sit to supine: Modified independent     Transfers Overall transfer level: Needs assistance Equipment used: 4-wheeled walker Transfers: Sit to/from Stand Sit to Stand: Min guard         General transfer comment: Patient able to lock brakes on rollator.     Balance Min guard for ambulation with rollator. Pt able to reach behind with one hand during simulated functional task at Min guard level while standing. Sat EOB with no assist for balance. History of falls.                             ADL Overall ADL's : Needs assistance/impaired                 Upper Body Dressing : Set up;Sitting   Lower Body Dressing: Min guard;Sit to/from stand   Toilet Transfer: Min guard;Ambulation (rollator; bed)   Toileting- Clothing Manipulation and Hygiene: Min guard;Sit  to/from stand       Functional mobility during ADLs: Min guard (rollator) General ADL Comments: Educated on safety such as safe footwear and recommended sister to continue to be with her for tub transfer.     Vision     Perception     Praxis      Pertinent Vitals/Pain Pain Assessment: No/denies pain     Hand Dominance Right   Extremity/Trunk Assessment Upper Extremity Assessment Upper Extremity Assessment: Generalized weakness;Overall WFL for tasks assessed    Lower Extremity Assessment Lower Extremity Assessment: Defer to PT evaluation RLE Sensation: history of peripheral neuropathy RLE Coordination: decreased gross motor LLE Sensation: history of peripheral neuropathy LLE Coordination: decreased gross motor       Communication Communication Communication: No difficulties   Cognition Arousal/Alertness: Lethargic (became more awake in session) Behavior During Therapy: WFL for tasks assessed/performed Overall Cognitive Status: Within Functional Limits for tasks assessed                     General Comments       Exercises  pt performed ankle pumps; instructed pt to be moving arms.     Shoulder Instructions      Home Living Family/patient expects to be discharged to:: Private residence Living Arrangements: Other relatives (sister) Available Help at Discharge: Family;Available 24 hours/day (sister 42 hoursl son lives nearby and helps) Type of Home: Apartment  Home Access: Level entry     Home Layout: One level     Bathroom Shower/Tub: Producer, television/film/videoWalk-in shower   Bathroom Toilet: Standard     Home Equipment: Environmental consultantWalker - 4 wheels;Bedside commode;Shower seat;Grab bars - toilet   Additional Comments: Uses transport company to get to HD      Prior Functioning/Environment Level of Independence: Needs assistance  Gait / Transfers Assistance Needed: sister reports she sometimes has to assist with walking ADL's / Homemaking Assistance Needed: Assist for  bathing, dressing, meal prep, housekeeping.  Son provides transport for errands. Assist with shower transfer.        OT Diagnosis: Generalized weakness   OT Problem List: Decreased strength;Decreased knowledge of use of DME or AE;Decreased knowledge of precautions   OT Treatment/Interventions: Self-care/ADL training;Therapeutic exercise;DME and/or AE instruction;Therapeutic activities;Patient/family education;Balance training    OT Goals(Current goals can be found in the care plan section) Acute Rehab OT Goals Patient Stated Goal: to get back to moving how she was before OT Goal Formulation: With patient/family Time For Goal Achievement: 10/21/14 Potential to Achieve Goals: Good ADL Goals Pt Will Perform Lower Body Bathing: sit to/from stand;with supervision;with set-up Pt Will Perform Lower Body Dressing: sit to/from stand;with supervision;with set-up Pt Will Transfer to Toilet: with supervision;ambulating Additional ADL Goal #1: Pt will independently perform HEP for bilateral UEs to increase strength.  OT Frequency: Min 2X/week   Barriers to D/C:            Co-evaluation              End of Session Equipment Utilized During Treatment: Gait belt;Other (comment) (rollator) Nurse Communication: Mobility status;Other (comment) (bed alarm set)  Activity Tolerance: Patient tolerated treatment well Patient left: in bed;with call bell/phone within reach;with bed alarm set;with family/visitor present   Time: [id number] OT Time Calculation (min): 12 min Charges:  OT General Charges $OT Visit: 1 Procedure OT Evaluation $Initial OT Evaluation Tier I: 1 Procedure G-CodesEarlie Raveling:    Onnie Hatchel L OTR/L [phone] 10/14/2014, 5:34 PM

## 2014-10-14 NOTE — Progress Notes (Signed)
Subjective:  "That new med Neuro  gave stopping my Hand  Shakes"   Objective Vital signs in last 24 hours: Filed Vitals:   10/13/14 0957 10/13/14 1637 10/13/14 2001 10/14/14 0413  BP: 151/62 154/59 141/55 178/63  Pulse: 68 66 66 63  Temp: 99.1 F (37.3 C) 99.5 F (37.5 C) 99 F (37.2 C) 98.3 F (36.8 C)  TempSrc: Oral Oral Oral Oral  Resp: Height:      Weight:   63.957 kg (141 lb)   SpO2: 99% 99% 100% 100%   Weight change: -3.643 kg (-8 lb 0.5 oz) Physical Exam: General: Alert OX3 , baseline MS ,cooperative Heart: RRR soft 1/6 sem lsb no rub or gallop Lungs: CTA bilat Abdomen: bs Pos soft NT, ND Extremities: No pedal edema Dialysis Access: Pos bruit LUA AVF   OP HD: MWF [facility] 4h 61kg 2/2 bath Heparin 1800 LUA AVF Aranesp 25 ug every 2wks Calcitriol 1.25 ug tiw OP labs tsat 52%, ferr 1380, Hb 11 // pth 405, phos 3.5, Ca 9.5  Problem/Plan: 1. AMS / myoclonus- neuro says HONK enough to cause AMS / encephalopathy, recommended short-term Keppra for myoclonus, working 2. Abd pain - CT suggesting focal pancreatitis but no necrosis/ no abd pain and lipase wnl 3. ESRD on HD- MWF On schedule ([place])  4. HTN -on 4 BP meds at home and in [place] only on Clonidine 0.1mg  daily bp = 151/62 with pt ~~ 4 kg >edw 99%o2 sat Uf 4 to 4.5 l in am  5. DM uncontrolled - BS 1300/ 500  On am accu ck Admit team RX ( NOTED  OP labs History Of Poor control = from [facility] center past 2 months with Gluc= 700S' and HGB A 1c =13.12 August 2014/ prior 12.7 Jan . ) She tells me PCP follows her for BS control 6. 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 Nutrtion - ALB 2.2  Add nepro to renal carb mod diet/ renal vitamin  Lenny Pastel, PA-C [facility] Beeper [phone] 10/14/2014,8:40 AM  LOS: 4 days   Pt seen, examined and agree w A/P as above. Doing much better. Most of problems prob d/t HONK. Fevers resolved and cx's negative. HD tomorrow.  Mobilize. PT.  Vinson Moselle MD pager [phone]    cell [phone] 10/14/2014, 4:54 PM    Labs: Basic Metabolic Panel:  Recent Labs Lab 10/11/14 0520 10/11/14 1050 10/12/14 1530  NA 132* 135 133*  K 3.2* 3.3* 3.7  CL 96* 102 103  CO2 GLUCOSE 79 117* 279*  BUN 13 14 27*  CREATININE 3.17* 3.38* 4.44*  CALCIUM 9.1 8.1* 8.1*  PHOS  --  2.3* 3.0   Liver Function Tests:  Recent Labs Lab 10/10/14 1240 10/11/14 0520 10/11/14 1050 10/12/14 1530  AST 58* 52* 53*  --   ALT 41 34 28  --   ALKPHOS 334* 222* 165*  --   BILITOT 0.9 0.4 0.4  --   PROT 6.8 5.5* 4.2*  --   ALBUMIN 3.5 2.7* 2.1*  2.1* 2.2*    Recent Labs Lab 10/10/14 1240  LIPASE 42    Recent Labs Lab 10/13/14 2056  AMMONIA 44*   CBC:  Recent Labs Lab 10/10/14 1240 10/11/14 0520 10/11/14 1050 10/12/14 1530  WBC 4.0 14.0* 11.2* 8.6  NEUTROABS 3.2  --   --   --   HGB 11.4*  12.0 10.0* 9.4*  HCT 35.1* 33.1* 28.0* 27.5*  MCV 95.6 88.3 88.9 91.4  PLT 128* 169 127* 111*   Cardiac Enzymes:  Recent Labs Lab 10/10/14 1240  TROPONINI <0.03   CBG:  Recent Labs Lab 10/13/14 0411 10/13/14 0757 10/13/14 1140 10/13/14 1631 10/14/14 0829  GLUCAP 313* 194* 300* 415* 596*    Studies/Results: No results found. Medications:   . antiseptic oral rinse  7 mL Mouth Rinse BID  . aspirin EC  81 mg Oral Daily  . calcitRIOL  0.25 mcg Oral Daily  . calcitRIOL  1.25 mcg Oral Q T,Th,Sa-HD  . [START ON 10/15/2014] calcitRIOL  1.25 mcg Oral Q M,W,F-HD  . cloNIDine  0.1 mg Oral Daily  . heparin  5,000 Units Subcutaneous 3 times per day  . insulin aspart  0-9 Units Subcutaneous TID WC  . insulin aspart  3 Units Subcutaneous TID WC  . insulin glargine  10 Units Subcutaneous Daily  . levETIRAcetam  500 mg Oral Daily  . multivitamin  1 tablet Oral QHS  . pantoprazole  40 mg Oral BID  . sevelamer carbonate  800 mg Oral TID WC  . sodium chloride  10-40 mL Intracatheter Q12H  . sodium chloride  3 mL  Intravenous Q12H  . sodium chloride  3 mL Intravenous Q12H

## 2014-10-14 NOTE — Evaluation (Addendum)
Physical Therapy Evaluation Patient Details Name: Madeline [name] MRN: [id number] DOB: 13-Oct-1955 Today's Date: 10/14/2014   History of Present Illness  Patient is a 59 y.o. female admitted 10/10/14 with hyperglycemia (1374), N/V, abdominal pain, metabolic encephalopathy. Patient with HONK, pancreatitis, asterixis.  On 10/11/14 patient with hypotension and SIRS, transferred to ICU.  Now on Renal unit.  PMH:  ESRD on HD MWF, DM, CHF, COPD, CAD, HTN, CVA, falls  Clinical Impression  Patient presents with problems listed below.  Will benefit from acute PT to maximize functional mobility prior to return home with sister.  Patient with decreased strength, balance, and coordination, impacting functional mobility.  Recommend f/u HHPT.    Follow Up Recommendations Home health PT;Supervision/Assistance - 24 hour    Equipment Recommendations  None recommended by PT    Recommendations for Other Services       Precautions / Restrictions Precautions Precautions: Fall Precaution Comments: h/o falls Restrictions Weight Bearing Restrictions: No      Mobility  Bed Mobility Overal bed mobility: Needs Assistance Bed Mobility: Supine to Sit;Sit to Supine     Supine to sit: Supervision Sit to supine: Supervision   General bed mobility comments: Supervision for safety only.  Once upright, patient with good sitting balance.  Patient sat EOB x 8 minutes.  Transfers Overall transfer level: Needs assistance Equipment used: 4-wheeled walker Transfers: Sit to/from Stand Sit to Stand: Min guard         General transfer comment: Patient able to lock brakes on rollator.  Assist for safety during transition.  Ambulation/Gait Ambulation/Gait assistance: Min assist Ambulation Distance (Feet): 24 Feet Assistive device: 4-wheeled walker Gait Pattern/deviations: Step-through pattern;Decreased stride length;Ataxic;Trunk flexed Gait velocity: Decreased Gait velocity interpretation: Below normal speed for  age/gender General Gait Details: Verbal cues to move at a slow pace for safety.  Patient maneuvering rollator safely.  Noted ataxic gait with flexed posture.  Fatigued quickly.  Stairs            Wheelchair Mobility    Modified Rankin (Stroke Patients Only)       Balance Overall balance assessment: Needs assistance Sitting-balance support: No upper extremity supported;Feet supported Sitting balance-Leahy Scale: Fair     Standing balance support: Single extremity supported Standing balance-Leahy Scale: Poor                               Pertinent Vitals/Pain Pain Assessment: No/denies pain    Home Living Family/patient expects to be discharged to:: Private residence Living Arrangements: Other relatives (sister) Available Help at Discharge: Family;Available 24 hours/day (sister 70 hoursl son lives nearby and helps) Type of Home: Apartment Home Access: Level entry     Home Layout: One level Home Equipment: Walker - 4 wheels;Bedside commode;Shower seat;Grab bars - toilet Additional Comments: Uses transport company to get to HD    Prior Function Level of Independence: Needs assistance   Gait / Transfers Assistance Needed: Sister provides supervision during gait with rollator.  ADL's / Homemaking Assistance Needed: Assist for bathing, dressing, meal prep, housekeeping.  Son provides transport for errands.        Hand Dominance   Dominant Hand: Right    Extremity/Trunk Assessment   Upper Extremity Assessment: Generalized weakness           Lower Extremity Assessment: Generalized weakness         Communication   Communication: No difficulties  Cognition Arousal/Alertness: Awake/alert Behavior During Therapy: WFL for  tasks assessed/performed Overall Cognitive Status: Within Functional Limits for tasks assessed (Per sister, baseline)                      General Comments      Exercises        Assessment/Plan    PT  Assessment Patient needs continued PT services  PT Diagnosis Difficulty walking;Abnormality of gait;Generalized weakness   PT Problem List Decreased strength;Decreased activity tolerance;Decreased balance;Decreased mobility;Decreased coordination  PT Treatment Interventions DME instruction;Gait training;Functional mobility training;Therapeutic activities;Therapeutic exercise;Balance training;Patient/family education   PT Goals (Current goals can be found in the Care Plan section) Acute Rehab PT Goals Patient Stated Goal: To go home PT Goal Formulation: With patient/family Time For Goal Achievement: 10/21/14 Potential to Achieve Goals: Good    Frequency Min 3X/week   Barriers to discharge        Co-evaluation               End of Session Equipment Utilized During Treatment: Gait belt Activity Tolerance: Patient limited by fatigue Patient left: in bed;with call bell/phone within reach;with bed alarm set;with family/visitor present Nurse Communication: Mobility status         Time: [id number] PT Time Calculation (min) (ACUTE ONLY): 15 min   Charges:   PT Evaluation $Initial PT Evaluation Tier I: 1 Procedure     PT G CodesVena Austria:        Benny Henrie H 10/14/2014, 5:30 PM Durenda HurtSusan H. Renaldo Fiddleravis, PT, [facility] Acute Rehab Services Pager [phone]

## 2014-10-14 NOTE — Progress Notes (Signed)
Was the fall witnessed: yes  Patient condition before and after the fall: both before and after the fall, the pt is A &O x4. 1 person assist with walker from home. Jerky to bilateral arms intermittently. After fall pt admitted to periodic falls secondary to knees giving out.  Patient's reaction to the fall: She felt that nothing could be done to prevent the fall.    Name of the doctor that was notified including date and time: Elray McgregorMary Lynch NP with [facility] on 10/14/14 at 0430  Any interventions and vital signs: Continue to monitor patient.  VS to be taken 2 hours post fall and every 4 hours there after x 24 hours.    Patient had no injury from the fall.  Daughter notified and all questions addressed.  Will continue to monitor patient.

## 2014-10-14 NOTE — Progress Notes (Signed)
[facility] PROGRESS NOTE  Madeline [name] ZOX:[id number] DOB: 26-Oct-1955 DOA: 10/10/2014 PCP: Pamelia Hoit, MD   PCCM Transfer to [facility] 6/4 Assessment/Plan: 1. SIRS with Fever/Hypotension on 6/2, necessitating ICU transfer and temporary pressors -source unclear, CT abd with mild panc fullness and possible focal pancreatitis with normal Lipase -was started on IV Imipenem 6/2, Blood cx negative; suspect Viral or pancreatic inflammation as possible etiology of fever in absence of other source. -random cortisol 19 -since clinically improved, stable vitals, negative cultures, stopped Imipenem 6/4 -afebrile and stable, WBC 5  2. Encephalopathy -metabolic due to HONK , CBG 1300 on admission, neurontin -improved, was also having upper extremity numbness and jerky movts prior to admission for 3-4weeks -Neuro consulted per Renal recs; felt to be metabolic and started on daily Keppra for asterixes   3. Abd pain -on admission due to mild pancreatitis resolved -Ca 19-9 normal, needs GI FU for idiopathic pancreatitis  4. Brittle DM; CBgs high and fluctuating -takes Lantus and humalog at home -Hbaic pending -increase Novolog meal coverage and sensitive SSI, start lantus -DM coordinator consult appreciated  4. HTN -takes multiple meds at home, now on clonidine  5. H/o COPD/Pulm HTN and Right heart failure -stable  6. Anemia of chronic disease  DVT proph: Hep SQ  Code Status: Full Code Family Communication: family at bedside Disposition Plan: Home when stable   Consultants:  Renal  HPI/Subjective: Mild abd pain this am, no further fevers  Objective: Filed Vitals:   10/14/14 0902  BP: 173/70  Pulse: 73  Temp: 98.6 F (37 C)  Resp: 17    Intake/Output Summary (Last 24 hours) at 10/14/14 0955 Last data filed at 10/14/14 0903  Gross per 24 hour  Intake    560 ml  Output      0 ml  Net    560 ml   Filed Weights   10/12/14 1859 10/13/14 0616 10/13/14 2001  Weight:  64.8 kg (142 lb 13.7 oz) 63.902 kg (140 lb 14.1 oz) 63.957 kg (141 lb)    Exam:   General:  AAOx3, no distress  Cardiovascular: S1S2/RRR  Respiratory: CTAB  Abdomen: soft, NT, BS present  Musculoskeletal: no edema c.c  Left arm with AVF   Neuro: mild asterixes  Data Reviewed: Basic Metabolic Panel:  Recent Labs Lab 10/10/14 1240 10/11/14 0520 10/11/14 1050 10/12/14 1530 10/14/14 0815  NA 120* 132* 135 133* 133*  K 3.8 3.2* 3.3* 3.7 4.2  CL 80* 96* 102 103 97*  CO2 GLUCOSE 1374* 79 117* 279* 609*  BUN 27* 24*  CREATININE 3.10* 3.17* 3.38* 4.44* 3.67*  CALCIUM 9.2 9.1 8.1* 8.1* 8.5*  PHOS  --   --  2.3* 3.0  --    Liver Function Tests:  Recent Labs Lab 10/10/14 1240 10/11/14 0520 10/11/14 1050 10/12/14 1530 10/14/14 0815  AST 58* 52* 53*  --  55*  ALT 41 34 28  --  24  ALKPHOS 334* 222* 165*  --  202*  BILITOT 0.9 0.4 0.4  --  0.9  PROT 6.8 5.5* 4.2*  --  5.2*  ALBUMIN 3.5 2.7* 2.1*  2.1* 2.2* 2.2*    Recent Labs Lab 10/10/14 1240  LIPASE 42    Recent Labs Lab 10/13/14 2056  AMMONIA 44*   CBC:  Recent Labs Lab 10/10/14 1240 10/11/14 0520 10/11/14 1050 10/12/14 1530 10/14/14 0815  WBC 4.0 14.0* 11.2* 8.6 5.1  NEUTROABS 3.2  --   --   --   --  HGB 11.4* 12.0 10.0* 9.4* 10.2*  HCT 35.1* 33.1* 28.0* 27.5* 30.5*  MCV 95.6 88.3 88.9 91.4 92.4  PLT 128* 169 127* 111* 102*   Cardiac Enzymes:  Recent Labs Lab 10/10/14 1240  TROPONINI <0.03   BNP (last 3 results) No results for input(s): BNP in the last 8760 hours.  ProBNP (last 3 results) No results for input(s): PROBNP in the last 8760 hours.  CBG:  Recent Labs Lab 10/13/14 0757 10/13/14 1140 10/13/14 1631 10/14/14 0829 10/14/14 0856  GLUCAP 194* 300* 415* 596* 576*    Recent Results (from the past 240 hour(s))  MRSA PCR Screening     Status: None   Collection Time: 10/10/14  8:33 PM  Result Value Ref Range Status   MRSA by PCR NEGATIVE  NEGATIVE Final    Comment:        The GeneXpert MRSA Assay (FDA approved for NASAL specimens only), is one component of a comprehensive MRSA colonization surveillance program. It is not intended to diagnose MRSA infection nor to guide or monitor treatment for MRSA infections.   Culture, blood (routine x 2)     Status: None (Preliminary result)   Collection Time: 10/11/14  7:55 AM  Result Value Ref Range Status   Specimen Description BLOOD RIGHT WRIST  Final   Special Requests BOTTLES DRAWN AEROBIC ONLY 4CC  Final   Culture   Final           BLOOD CULTURE RECEIVED NO GROWTH TO DATE CULTURE WILL BE HELD FOR 5 DAYS BEFORE ISSUING A FINAL NEGATIVE REPORT Performed at [facility]    Report Status PENDING  Incomplete  Culture, Urine     Status: None   Collection Time: 10/11/14  8:23 AM  Result Value Ref Range Status   Specimen Description URINE, CATHETERIZED  Final   Special Requests NONE  Final   Colony Count NO GROWTH Performed at [facility]   Final   Culture NO GROWTH Performed at [facility]   Final   Report Status 10/12/2014 FINAL  Final  Culture, blood (routine x 2)     Status: None (Preliminary result)   Collection Time: 10/11/14 12:00 PM  Result Value Ref Range Status   Specimen Description BLOOD RIGHT NECK  Final   Special Requests BOTTLES DRAWN AEROBIC AND ANAEROBIC 10CCS   Final   Culture   Final           BLOOD CULTURE RECEIVED NO GROWTH TO DATE CULTURE WILL BE HELD FOR 5 DAYS BEFORE ISSUING A FINAL NEGATIVE REPORT Note: Culture results may be compromised due to an excessive volume of blood received in culture bottles. Performed at [facility]    Report Status PENDING  Incomplete     Studies: No results found.  Scheduled Meds: . antiseptic oral rinse  7 mL Mouth Rinse BID  . aspirin EC  81 mg Oral Daily  . calcitRIOL  0.25 mcg Oral Daily  . calcitRIOL  1.25 mcg Oral Q T,Th,Sa-HD  . [START ON 10/15/2014] calcitRIOL  1.25  mcg Oral Q M,W,F-HD  . cloNIDine  0.1 mg Oral Daily  . heparin  5,000 Units Subcutaneous 3 times per day  . insulin aspart  0-9 Units Subcutaneous TID WC  . insulin aspart  3 Units Subcutaneous TID WC  . insulin glargine  10 Units Subcutaneous Daily  . levETIRAcetam  500 mg Oral Daily  . multivitamin  1 tablet Oral QHS  . pantoprazole  40  mg Oral BID  . sevelamer carbonate  800 mg Oral TID WC  . sodium chloride  10-40 mL Intracatheter Q12H  . sodium chloride  3 mL Intravenous Q12H  . sodium chloride  3 mL Intravenous Q12H   Continuous Infusions:  Antibiotics Given (last 72 hours)    Date/Time Action Medication Dose Rate   10/11/14 1026 Given   vancomycin (VANCOCIN) 1,250 mg in sodium chloride 0.9 % 250 mL IVPB 1,250 mg 166.7 mL/hr   10/11/14 1200 Given   imipenem-cilastatin (PRIMAXIN) 250 mg in sodium chloride 0.9 % 100 mL IVPB 250 mg 200 mL/hr   10/11/14 2212 Given   imipenem-cilastatin (PRIMAXIN) 250 mg in sodium chloride 0.9 % 100 mL IVPB 250 mg 200 mL/hr   10/12/14 1054 Given   imipenem-cilastatin (PRIMAXIN) 250 mg in sodium chloride 0.9 % 100 mL IVPB 250 mg 200 mL/hr   10/12/14 2251 Given   imipenem-cilastatin (PRIMAXIN) 250 mg in sodium chloride 0.9 % 100 mL IVPB 250 mg 200 mL/hr   10/13/14 1046 Given   imipenem-cilastatin (PRIMAXIN) 250 mg in sodium chloride 0.9 % 100 mL IVPB 250 mg 200 mL/hr      Active Problems:   DM (diabetes mellitus)   Hyperglycemia   ESRD on hemodialysis   Anemia   Abdominal pain   Encounter for central line placement   Fever    Time spent:    [facility]  [facility] Pager [phone]. If 7PM-7AM, please contact night-coverage at [email or web address], password [facility] 10/14/2014, 9:55 AM  LOS: 4 days

## 2014-10-15 DIAGNOSIS — R1033 Periumbilical pain: Secondary | ICD-10-CM

## 2014-10-15 LAB — RENAL FUNCTION PANEL
Albumin: 2.2 g/dL — ABNORMAL LOW (ref 3.5–5.0)
Anion gap: 9 (ref 5–15)
BUN: 29 mg/dL — ABNORMAL HIGH (ref 6–20)
CALCIUM: 8.5 mg/dL — AB (ref 8.9–10.3)
CO2: 25 mmol/L (ref 22–32)
Chloride: 99 mmol/L — ABNORMAL LOW (ref 101–111)
Creatinine, Ser: 4.1 mg/dL — ABNORMAL HIGH (ref 0.44–1.00)
GFR calc non Af Amer: 11 mL/min — ABNORMAL LOW (ref >60)
GFR, EST AFRICAN AMERICAN: 13 mL/min — AB (ref >60)
GLUCOSE: 311 mg/dL — AB (ref 65–99)
POTASSIUM: 3.7 mmol/L (ref 3.5–5.1)
Phosphorus: 2.2 mg/dL — ABNORMAL LOW (ref 2.5–4.6)
Sodium: 133 mmol/L — ABNORMAL LOW (ref 135–145)

## 2014-10-15 LAB — CBC
HCT: 30.7 % — ABNORMAL LOW (ref 36.0–46.0)
HEMOGLOBIN: 10.4 g/dL — AB (ref 12.0–15.0)
MCH: 30.7 pg (ref 26.0–34.0)
MCHC: 33.9 g/dL (ref 30.0–36.0)
MCV: 90.6 fL (ref 78.0–100.0)
PLATELETS: 126 10*3/uL — AB (ref 150–400)
RBC: 3.39 MIL/uL — AB (ref 3.87–5.11)
RDW: 12.3 % (ref 11.5–15.5)
WBC: 5.7 10*3/uL (ref 4.0–10.5)

## 2014-10-15 LAB — HEMOGLOBIN A1C
Hgb A1c MFr Bld: 15.5 % — ABNORMAL HIGH (ref 4.8–5.6)
Mean Plasma Glucose: 398 mg/dL

## 2014-10-15 LAB — GLUCOSE, CAPILLARY
Glucose-Capillary: 162 mg/dL — ABNORMAL HIGH (ref 65–99)
Glucose-Capillary: 215 mg/dL — ABNORMAL HIGH (ref 65–99)
Glucose-Capillary: 172 mg/dL — ABNORMAL HIGH (ref 65–99)

## 2014-10-15 MED ORDER — DARBEPOETIN ALFA 60 MCG/0.3ML IJ SOSY
PREFILLED_SYRINGE | INTRAMUSCULAR | Status: AC
  Filled 2014-10-15: qty 0.3

## 2014-10-15 MED ORDER — INSULIN GLARGINE 100 UNIT/ML ~~LOC~~ SOLN
12.0000 [IU] | Freq: Every day | SUBCUTANEOUS | Status: DC
  Administered 2014-10-16: 12 [IU] via SUBCUTANEOUS
  Filled 2014-10-15: qty 0.12

## 2014-10-15 MED ORDER — DARBEPOETIN ALFA 60 MCG/0.3ML IJ SOSY
60.0000 ug | PREFILLED_SYRINGE | INTRAMUSCULAR | Status: DC
  Administered 2014-10-15: 60 ug via INTRAVENOUS

## 2014-10-15 MED ORDER — HYDROMORPHONE HCL 1 MG/ML IJ SOLN
INTRAMUSCULAR | Status: AC
  Filled 2014-10-15: qty 1

## 2014-10-15 MED ORDER — HYDROMORPHONE HCL 1 MG/ML IJ SOLN
1.0000 mg | INTRAMUSCULAR | Status: DC | PRN
  Administered 2014-10-15 – 2014-10-16 (×4): 1 mg via INTRAVENOUS
  Filled 2014-10-15 (×3): qty 1

## 2014-10-15 MED ORDER — INSULIN ASPART 100 UNIT/ML ~~LOC~~ SOLN
5.0000 [IU] | Freq: Three times a day (TID) | SUBCUTANEOUS | Status: DC
  Administered 2014-10-15 – 2014-10-16 (×2): 5 [IU] via SUBCUTANEOUS

## 2014-10-15 NOTE — Procedures (Signed)
I was present at this session.  I have reviewed the session itself and made appropriate changes.bp^, lowering vol. Access press ok.    Madeline [name] L 6/6/20168:28 AM

## 2014-10-15 NOTE — Progress Notes (Signed)
PT Cancellation Note  Patient Details Name: Madeline [name] MRN: [id number] DOB: 02-09-1956   Cancelled Treatment:    Reason Eval/Treat Not Completed: Patient at procedure or test/unavailable   Currently in HD;  Will follow up later today as time allows;  Otherwise, will follow up for PT tomorrow;   Thank you,  Van ClinesHolly Jarelyn Bambach, PT  Acute Rehabilitation Services Pager [phone] Office [phone]     Van ClinesGarrigan, Zhania Sha[facility] 10/15/2014, 7:49 AM

## 2014-10-15 NOTE — Progress Notes (Signed)
Subjective: Interval History: has no complaint , feels much better..  Objective: Vital signs in last 24 hours: Temp:  [97.9 F (36.6 C)-98.6 F (37 C)] 97.9 F (36.6 C) (06/06 0725) Pulse Rate:  [59-73] 62 (06/06 0806) Resp:  [16-18] 16 (06/06 0806) BP: (152-173)/(63-104) 172/85 mmHg (06/06 0806) SpO2:  [95 %-100 %] 95 % (06/06 0725) Weight:  [66.2 kg (145 lb 15.1 oz)] 66.2 kg (145 lb 15.1 oz) (06/06 0725) Weight change:   Intake/Output from previous day: 06/05 0701 - 06/06 0700 In: 640 [P.O.:640] Out: 825 [Urine:825] Intake/Output this shift:    General appearance: alert and cooperative Resp: clear to auscultation bilaterally Cardio: S1, S2 normal and systolic murmur: holosystolic 2/6, blowing at apex GI: soft, non-tender; bowel sounds normal; no masses,  no organomegaly Extremities: VF LUA on HD  Lab Results:  Recent Labs  10/14/14 0815 10/15/14 0755  WBC 5.1 5.7  HGB 10.2* 10.4*  HCT 30.5* 30.7*  PLT 102* 126*   BMET:  Recent Labs  10/14/14 0815 10/15/14 0756  NA 133* 133*  K 4.2 3.7  CL 97* 99*  CO2 26 25  GLUCOSE 609* 311*  BUN 24* 29*  CREATININE 3.67* 4.10*  CALCIUM 8.5* 8.5*   No results for input(s): PTH in the last 72 hours. Iron Studies: No results for input(s): IRON, TIBC, TRANSFERRIN, FERRITIN in the last 72 hours.  Studies/Results: No results found.  I have reviewed the patient's current medications.  Assessment/Plan: 1 ESRD on HD , remove solute and vol 2 Anemia  Add epo 3 HTn lower vol 4 DM titrating insulin 5 HPTH meds P HD, epo., DM control , vit D    LOS: 5 days   Marirose Deveney L 10/15/2014,8:28 AM

## 2014-10-15 NOTE — Progress Notes (Signed)
Inpatient Diabetes Program Recommendations  AACE/ADA: New Consensus Statement on Inpatient Glycemic Control (2013)  Target Ranges:  Prepandial:   less than 140 mg/dL      Peak postprandial:   less than 180 mg/dL (1-2 hours)      Critically ill patients:  140 - 180 mg/dL    Inpatient Diabetes Program Recommendations Insulin - Basal: xxxxxxxxxxxxxxxxx Correction (SSI): xxxxxxxxxxxxxx Insulin - Meal Coverage: Meal coverage added yesterday mid-day and glucose is now normalizing. Pt may need this MC added at home, as his only novolog bolus is a SSI which betgins wity 3 units at 200 mg/dL Pt could take meal coverage only when he is eating. Home-Then continues with 5 units at 201 mg/dL and increase by 2 units per 50 mg/dL increase in CBG.   Thank you Lenor CoffinAnn Juanluis Guastella, RN, MSN, CDE  Diabetes Inpatient Program Office: [phone] Pager: [phone] 8:00 am to 5:00 pm

## 2014-10-15 NOTE — Progress Notes (Signed)
[facility] PROGRESS NOTE  Madeline [name] QMV:[id number] DOB: 04-06-1956 DOA: 10/10/2014 PCP: [name],[name], MD   [facility] Assessment/Plan: 1. SIRS with Fever/Hypotension on 6/2, necessitating ICU transfer and temporary pressors -source unclear, CT abd with mild panc fullness and possible focal pancreatitis with normal Lipase -was started on IV Imipenem 6/2, Blood cx negative; suspect Viral or pancreatic inflammation as possible etiology of fever in absence of other source. -random cortisol 19 -since clinically improved, stable vitals, negative cultures, stopped Imipenem 6/4 -afebrile and stable off Abx, WBC 5  2. Encephalopathy -metabolic due to HONK , CBG 1300 on admission, neurontin -improved, was also having upper extremity numbness and jerky movts prior to admission for 3-4weeks -Neuro consulted per Renal recs; felt to be metabolic and started on daily Keppra for asterixes  -much improved  3. Abd pain -on admission due to mild pancreatitis resolved -Ca 19-9 normal, needs GI FU for idiopathic pancreatitis -tolerating diet, pain on HD  4. Brittle DM; CBgs high and fluctuating -takes Lantus and humalog at home -Hbaic 15.5 -increase Novolog meal coverage and sensitive SSI, lantus -DM coordinator consult appreciated   4. HTN -takes multiple meds at home, now on clonidine  5. H/o COPD/Pulm HTN and Right heart failure -stable  6. Anemia of chronic disease  DVT proph: Hep SQ  Code Status: Full Code Family Communication: family at bedside Disposition Plan: Home when stable   Consultants:  Renal  HPI/Subjective: Mild abd pain this am, no further fevers  Objective: Filed Vitals:   10/15/14 1649  BP: 107/46  Pulse: 68  Temp: 99.8 F (37.7 C)  Resp: 18    Intake/Output Summary (Last 24 hours) at 10/15/14 1805 Last data filed at 10/15/14 1500  Gross per 24 hour  Intake    300 ml  Output   4808 ml  Net  -4508 ml   Filed Weights   10/13/14 2001 10/15/14 0725 10/15/14 1147  Weight: 63.957 kg (141 lb) 66.2 kg (145 lb 15.1 oz) 61.8 kg (136 lb 3.9 oz)    Exam:   General:  AAOx3, no distress  Cardiovascular: S1S2/RRR  Respiratory: CTAB  Abdomen: soft, NT, BS present  Musculoskeletal: no edema c.c  Left arm with AVF   Neuro: mild asterixes  Data Reviewed: Basic Metabolic Panel:  Recent Labs Lab 10/11/14 0520 10/11/14 1050 10/12/14 1530 10/14/14 0815 10/15/14 0756  NA 132* 135 133* 133* 133*  K 3.2* 3.3* 3.7 4.2 3.7  CL 96* 102 103 97* 99*  CO2 24 24 22 26 25   GLUCOSE 79 117* 279* 609* 311*  BUN 13 14 27* 24* 29*  CREATININE 3.17* 3.38* 4.44* 3.67* 4.10*  CALCIUM 9.1 8.1* 8.1* 8.5* 8.5*  PHOS  --  2.3* 3.0  --  2.2*   Liver Function Tests:  Recent Labs Lab 10/10/14 1240 10/11/14 0520 10/11/14 1050 10/12/14 1530 10/14/14 0815 10/15/14 0756  AST 58* 52* 53*  --  55*  --   ALT 41 34 28  --  24  --   ALKPHOS 334* 222* 165*  --  202*  --   BILITOT 0.9 0.4 0.4  --  0.9  --   PROT 6.8 5.5* 4.2*  --  5.2*  --   ALBUMIN 3.5 2.7* 2.1*  2.1* 2.2* 2.2* 2.2*    Recent Labs Lab 10/10/14 1240  LIPASE 42    Recent Labs Lab 10/13/14 2056  AMMONIA 44*   CBC:  Recent Labs Lab 10/10/14 1240 10/11/14 0520  10/11/14 1050 10/12/14 1530 10/14/14 0815 10/15/14 0755  WBC 4.0 14.0* 11.2* 8.6 5.1 5.7  NEUTROABS 3.2  --   --   --   --   --   HGB 11.4* 12.0 10.0* 9.4* 10.2* 10.4*  HCT 35.1* 33.1* 28.0* 27.5* 30.5* 30.7*  MCV 95.6 88.3 88.9 91.4 92.4 90.6  PLT 128* 169 127* 111* 102* 126*   Cardiac Enzymes:  Recent Labs Lab 10/10/14 1240  TROPONINI <0.03   BNP (last 3 results) No results for input(s): BNP in the last 8760 hours.  ProBNP (last 3 results) No results for input(s): PROBNP in the last 8760 hours.  CBG:  Recent Labs Lab 10/14/14 1449 10/14/14 1631 10/14/14 2116 10/15/14 1230 10/15/14 1654  GLUCAP 373* 351* 233* 162* 215*    Recent Results (from the past 240  hour(s))  MRSA PCR Screening     Status: None   Collection Time: 10/10/14  8:33 PM  Result Value Ref Range Status   MRSA by PCR NEGATIVE NEGATIVE Final    Comment:        The GeneXpert MRSA Assay (FDA approved for NASAL specimens only), is one component of a comprehensive MRSA colonization surveillance program. It is not intended to diagnose MRSA infection nor to guide or monitor treatment for MRSA infections.   Culture, blood (routine x 2)     Status: None (Preliminary result)   Collection Time: 10/11/14  7:55 AM  Result Value Ref Range Status   Specimen Description BLOOD RIGHT WRIST  Final   Special Requests BOTTLES DRAWN AEROBIC ONLY 4CC  Final   Culture   Final           BLOOD CULTURE RECEIVED NO GROWTH TO DATE CULTURE WILL BE HELD FOR 5 DAYS BEFORE ISSUING A FINAL NEGATIVE REPORT Performed at [facility]    Report Status PENDING  Incomplete  Culture, Urine     Status: None   Collection Time: 10/11/14  8:23 AM  Result Value Ref Range Status   Specimen Description URINE, CATHETERIZED  Final   Special Requests NONE  Final   Colony Count NO GROWTH Performed at [facility]   Final   Culture NO GROWTH Performed at [facility]   Final   Report Status 10/12/2014 FINAL  Final  Culture, blood (routine x 2)     Status: None (Preliminary result)   Collection Time: 10/11/14 12:00 PM  Result Value Ref Range Status   Specimen Description BLOOD RIGHT NECK  Final   Special Requests BOTTLES DRAWN AEROBIC AND ANAEROBIC 10CCS   Final   Culture   Final           BLOOD CULTURE RECEIVED NO GROWTH TO DATE CULTURE WILL BE HELD FOR 5 DAYS BEFORE ISSUING A FINAL NEGATIVE REPORT Note: Culture results may be compromised due to an excessive volume of blood received in culture bottles. Performed at [facility]    Report Status PENDING  Incomplete     Studies: No results found.  Scheduled Meds: . antiseptic oral rinse  7 mL Mouth Rinse BID  . aspirin  EC  81 mg Oral Daily  . calcitRIOL  0.25 mcg Oral Daily  . calcitRIOL  1.25 mcg Oral Q T,Th,Sa-HD  . calcitRIOL  1.25 mcg Oral Q M,W,F-HD  . Darbepoetin Alfa      . darbepoetin (ARANESP) injection - DIALYSIS  60 mcg Intravenous Q Mon-HD  . feeding supplement (NEPRO CARB STEADY)  237 mL Oral BID  BM  . heparin  5,000 Units Subcutaneous 3 times per day  . HYDROmorphone      . insulin aspart  0-9 Units Subcutaneous TID WC  . insulin aspart  4 Units Subcutaneous TID WC  . insulin glargine  10 Units Subcutaneous Daily  . levETIRAcetam  500 mg Oral Daily  . multivitamin  1 tablet Oral QHS  . pantoprazole  40 mg Oral BID  . sevelamer carbonate  800 mg Oral TID WC  . sodium chloride  10-40 mL Intracatheter Q12H  . sodium chloride  3 mL Intravenous Q12H  . sodium chloride  3 mL Intravenous Q12H   Continuous Infusions:  Antibiotics Given (last 72 hours)    Date/Time Action Medication Dose Rate   10/12/14 2251 Given   imipenem-cilastatin (PRIMAXIN) 250 mg in sodium chloride 0.9 % 100 mL IVPB 250 mg 200 mL/hr   10/13/14 1046 Given   imipenem-cilastatin (PRIMAXIN) 250 mg in sodium chloride 0.9 % 100 mL IVPB 250 mg 200 mL/hr      Active Problems:   DM (diabetes mellitus)   Hyperglycemia   ESRD on hemodialysis   Anemia   Abdominal pain   Encounter for central line placement   Fever    Time spent:    [facility]  [facility] Pager [phone]. If 7PM-7AM, please contact night-coverage at [email or web address], password [facility] 10/15/2014, 6:05 PM  LOS: 5 days

## 2014-10-15 NOTE — Progress Notes (Signed)
Patient attempting to pull R IJ central line,states"it bothers me,I don't want this no more". Explained to patient that she cannot just pull it out because it's a central line and that she will bleed out,also patient was informed that's where we give her medicine and where they draw labs. Patient tearful and insisting to pull it out.Patient's sister at the bedside.NT stayed in the room while RN notify charge nurse  and MD.Patient then also c/o chest pain pointing to mid chest to abdomen but refused to take anything for pain.K.Kirby made aware,order received to place bilateral wrist restraints,patient made aware.Sister unable to convince patient to do anything.Per sister,the son is coming to talk to her. 22:40 Son at bedside,able to convince patient to take pain medicine.Will continue to monitor. Madeline [name], Madeline Buttsharito [name], [name]

## 2014-10-16 DIAGNOSIS — R1013 Epigastric pain: Secondary | ICD-10-CM

## 2014-10-16 LAB — CBC
HCT: 33.1 % — ABNORMAL LOW (ref 36.0–46.0)
HEMOGLOBIN: 11.1 g/dL — AB (ref 12.0–15.0)
MCH: 31 pg (ref 26.0–34.0)
MCHC: 33.5 g/dL (ref 30.0–36.0)
MCV: 92.5 fL (ref 78.0–100.0)
PLATELETS: 162 10*3/uL (ref 150–400)
RBC: 3.58 MIL/uL — AB (ref 3.87–5.11)
RDW: 12.6 % (ref 11.5–15.5)
WBC: 7.5 10*3/uL (ref 4.0–10.5)

## 2014-10-16 LAB — COMPREHENSIVE METABOLIC PANEL
ALK PHOS: 158 U/L — AB (ref 38–126)
ALT: 21 U/L (ref 14–54)
ANION GAP: 11 (ref 5–15)
AST: 57 U/L — AB (ref 15–41)
Albumin: 2.3 g/dL — ABNORMAL LOW (ref 3.5–5.0)
BUN: 12 mg/dL (ref 6–20)
CALCIUM: 8.2 mg/dL — AB (ref 8.9–10.3)
CHLORIDE: 97 mmol/L — AB (ref 101–111)
CO2: 27 mmol/L (ref 22–32)
Creatinine, Ser: 3 mg/dL — ABNORMAL HIGH (ref 0.44–1.00)
GFR, EST AFRICAN AMERICAN: 19 mL/min — AB (ref >60)
GFR, EST NON AFRICAN AMERICAN: 16 mL/min — AB (ref >60)
GLUCOSE: 229 mg/dL — AB (ref 65–99)
POTASSIUM: 3.4 mmol/L — AB (ref 3.5–5.1)
Sodium: 135 mmol/L (ref 135–145)
Total Bilirubin: 0.9 mg/dL (ref 0.3–1.2)
Total Protein: 5.3 g/dL — ABNORMAL LOW (ref 6.5–8.1)

## 2014-10-16 LAB — GLUCOSE, CAPILLARY: Glucose-Capillary: 195 mg/dL — ABNORMAL HIGH (ref 65–99)

## 2014-10-16 LAB — LIPASE, BLOOD: Lipase: 127 U/L — ABNORMAL HIGH (ref 22–51)

## 2014-10-16 MED ORDER — POLYETHYLENE GLYCOL 3350 17 G PO PACK: 17.0000 g | PACK | Freq: Every day | ORAL | Status: DC

## 2014-10-16 MED ORDER — POLYETHYLENE GLYCOL 3350 17 G PO PACK
17.0000 g | PACK | Freq: Every day | ORAL | Status: DC
  Administered 2014-10-16: 17 g via ORAL
  Filled 2014-10-16: qty 1

## 2014-10-16 MED ORDER — LEVETIRACETAM 500 MG PO TABS: 500.0000 mg | ORAL_TABLET | Freq: Every day | ORAL | Status: DC

## 2014-10-16 MED ORDER — OXYCODONE HCL 5 MG PO TABS: 5.0000 mg | ORAL_TABLET | Freq: Four times a day (QID) | ORAL | Status: DC | PRN

## 2014-10-16 MED ORDER — INSULIN GLARGINE 100 UNIT/ML ~~LOC~~ SOLN: 12.0000 [IU] | Freq: Every day | SUBCUTANEOUS | Status: DC

## 2014-10-16 MED ORDER — TRAMADOL HCL 50 MG PO TABS
50.0000 mg | ORAL_TABLET | Freq: Two times a day (BID) | ORAL | Status: DC | PRN
Start: 2014-10-16 — End: 2014-10-26

## 2014-10-16 MED ORDER — ONDANSETRON HCL 4 MG/2ML IJ SOLN: 4.0000 mg | Freq: Four times a day (QID) | INTRAMUSCULAR | Status: DC | PRN

## 2014-10-16 MED ORDER — SENNOSIDES-DOCUSATE SODIUM 8.6-50 MG PO TABS
1.0000 | ORAL_TABLET | Freq: Two times a day (BID) | ORAL | Status: DC
  Administered 2014-10-16: 1 via ORAL
  Filled 2014-10-16: qty 1

## 2014-10-16 NOTE — Progress Notes (Signed)
Occupational Therapy Treatment Patient Details Name: Madeline [name] MRN: [id number] DOB: Sep 01, 1955 Today's Date: 10/16/2014    History of present illness Patient is a 59 y.o. female admitted 10/10/14 with hyperglycemia (1374), N/V, abdominal pain, metabolic encephalopathy. Patient with HONK, pancreatitis, asterixis.  On 10/11/14 patient with hypotension and SIRS, transferred to ICU.  Now on Renal unit.  PMH:  ESRD on HD MWF, DM, CHF, COPD, CAD, HTN, CVA, falls   OT comments  Pt progressing. Ambulated in hallway with OT and performed UE exercises. Instructed pt and her sister to clarify with MD if UE exercises with theraband okay to do.  Follow Up Recommendations  Home health OT;Supervision/Assistance - 24 hour    Equipment Recommendations  None recommended by OT    Recommendations for Other Services      Precautions / Restrictions Precautions Precautions: Fall Precaution Comments: h/o falls Restrictions Weight Bearing Restrictions: No       Mobility Bed Mobility Overal bed mobility: Modified Independent Bed Mobility: Supine to Sit     Supine to sit: Modified independent (Device/Increase time)        Transfers Overall transfer level: Needs assistance   Transfers: Sit to/from Stand Sit to Stand: Supervision         General transfer comment: safe use of rollator/locking brakes.    Balance  No LOB in session. Balance not formally assessed.                                 ADL Overall ADL's : Needs assistance/impaired                         Toilet Transfer: Min guard;Ambulation (bed; rollator)             General ADL Comments: Educated on safety such as rugs/items on floor and sitting for LB dressing.       Vision                     Perception     Praxis      Cognition  Awake/Alert Behavior During Therapy: WFL for tasks assessed/performed Overall Cognitive Status: Within Functional Limits for tasks assessed                        Extremity/Trunk Assessment               Exercises  Pt performed bilateral UE theraband exercises with level 1 **Instructed pt and her sister to clarify with MD about UE exercises as pt reports her AVF was done yesterday in left arm, however looking at notes, does not appear it was altered with yesterday.   Shoulder Instructions       General Comments      Pertinent Vitals/ Pain       Pain Assessment: No/denies pain  Home Living                                          Prior Functioning/Environment              Frequency Min 2X/week     Progress Toward Goals  OT Goals(current goals can now be found in the care plan section)  Progress towards OT goals: Progressing toward goals  Acute Rehab OT Goals Patient  Stated Goal: go home OT Goal Formulation: With patient/family Time For Goal Achievement: 10/21/14 Potential to Achieve Goals: Good ADL Goals Pt Will Perform Lower Body Bathing: sit to/from stand;with supervision;with set-up Pt Will Perform Lower Body Dressing: sit to/from stand;with supervision;with set-up Pt Will Transfer to Toilet: with supervision;ambulating Additional ADL Goal #1: Pt will independently perform HEP for bilateral UEs to increase strength.  Plan Discharge plan remains appropriate    Co-evaluation                 End of Session Equipment Utilized During Treatment: Gait belt;Other (comment) (rollator; theraband)   Activity Tolerance Patient tolerated treatment well   Patient Left in bed;with family/visitor present   Nurse Communication          Time: 1610-9604 OT Time Calculation (min): 14 min  Charges: OT General Charges $OT Visit: 1 Procedure OT Treatments $Therapeutic Exercise: 8-22 mins  Earlie Raveling OTR/L [phone] 10/16/2014, 4:08 PM

## 2014-10-16 NOTE — Care Management Note (Signed)
Case Management Note  Patient Details  Name: Madeline [name] MRN: [id number] Date of Birth: [birth year]  Subjective/Objective:             CM following for progression and d/c planning.       Action/Plan: No d/c needs identified.   Expected Discharge Date:       10/16/2014           Expected Discharge Plan:  Home/Self Care (From Home with Daughter [name])  In-House Referral:  NA  Discharge planning Services  NA  Post Acute Care Choice:  NA Choice offered to:  NA  DME Arranged:  N/A DME Agency:     HH Arranged:  NA HH Agency:     Status of Service:  Completed, signed off  Medicare Important Message Given:  No Date Medicare IM Given:    Medicare IM give by:    Date Additional Medicare IM Given:    Additional Medicare Important Message give by:     If discussed at Long Length of Stay Meetings, dates discussed:    Additional Comments:  Starlyn SkeansRoyal, Brindy Higginbotham U, RN 10/16/2014, 12:10 PM

## 2014-10-16 NOTE — Progress Notes (Signed)
Received call from lab that patient's blood that was collected earlier was hemolized so it needs to be recollected. Madeline [name], Madeline [name] Joselita, [name]

## 2014-10-16 NOTE — Progress Notes (Signed)
[facility] Progress Note  Assessment/Plan: 1. AMS - multifactorial - hard to pinpoint any one thing - no etiology of fevers/hyperglycemia resolved/off neurontin - alert and baseline; Keppra started per Neuro 2. ESRD - MWF -HD orders written for first round if not d/c today. 3. Anemia - Hgb 11.1 stable -  Aranesp 25 every two weeks, got 60 6/6 4. Secondary hyperparathyroidism - continue rocaltrol P 2.2 suspect due to hospital diet - no change for now Phos down with glu control, decrease binders. 5. HTN/volume - net UF 4.5 Monday with a post weight of 61.8 - still not quite to edw - BP came down during HD 6. Nutrition - alb 2.3 - intake about half of breakfast but not protein source 7. DM -uncontrolled prior to admission - improved with Lantus and SSI 8. Abdominal pain - resolved ? Focal pancreatitis ?cause of V 9. Disp - looks good - ok for d/c per renal standpoint   Sheffield SliderMartha B Bergman, PA-C  [facility] Beeper [phone] 10/16/2014,9:16 AM  LOS: 6 days  I have seen and examined this patient and agree with the plan of care seen, examined,eval.  Changes made. .  Vaudine Dutan L 10/16/2014, 9:59 AM   Subjective:   Vomited after breakfast, something in breakfast.  Objective Filed Vitals:   10/15/14 1231 10/15/14 1649 10/15/14 2134 10/16/14 0511  BP: 138/62 107/46 157/59 145/61  Pulse: 64 68 67 64  Temp: 98.8 F (37.1 C) 99.8 F (37.7 C) 99.8 F (37.7 C) 98.3 F (36.8 C)  TempSrc: Oral Oral Oral Oral  Resp: 16 18 16 18   Height:      Weight:   62.3 kg (137 lb 5.6 oz)   SpO2: 100% 100% 98% 98%   Physical Exam sister at bedside, comes with her to all HD treatments General: alert, NAD Heart: RRR Gr 2/6 M Lungs: no rales Abdomen: soft, slight distended + BS nontender,liver down 5 cm Extremities: no LE edema Dialysis Access: left upper AVF + bruit  Dialysis Orders: MWF [facility] 4h 61kg 2/2 bath Heparin 1800 LUA AVF Aranesp 25 ug every  2wks Calcitriol 1.25 ug tiw OP labs tsat 52%, ferr 1380, Hb 11 // pth 405, phos 3.5, Ca 9.5  Additional Objective Labs: Basic Metabolic Panel:  Recent Labs Lab 10/11/14 1050 10/12/14 1530 10/14/14 0815 10/15/14 0756 10/16/14 0800  NA 135 133* 133* 133* 135  K 3.3* 3.7 4.2 3.7 3.4*  CL 102 103 97* 99* 97*  CO2 24 22 26 25 27   GLUCOSE 117* 279* 609* 311* 229*  BUN 14 27* 24* 29* 12  CREATININE 3.38* 4.44* 3.67* 4.10* 3.00*  CALCIUM 8.1* 8.1* 8.5* 8.5* 8.2*  PHOS 2.3* 3.0  --  2.2*  --    Liver Function Tests:  Recent Labs Lab 10/11/14 1050  10/14/14 0815 10/15/14 0756 10/16/14 0800  AST 53*  --  55*  --  57*  ALT 28  --  24  --  21  ALKPHOS 165*  --  202*  --  158*  BILITOT 0.4  --  0.9  --  0.9  PROT 4.2*  --  5.2*  --  5.3*  ALBUMIN 2.1*  2.1*  < > 2.2* 2.2* 2.3*  < > = values in this interval not displayed.  Recent Labs Lab 10/10/14 1240 10/16/14 0800  LIPASE 42 127*   CBC:  Recent Labs Lab 10/10/14 1240  10/11/14 1050 10/12/14 1530 10/14/14 0815 10/15/14 0755 10/16/14 0420  WBC 4.0  < >  11.2* 8.6 5.1 5.7 7.5  NEUTROABS 3.2  --   --   --   --   --   --   HGB 11.4*  < > 10.0* 9.4* 10.2* 10.4* 11.1*  HCT 35.1*  < > 28.0* 27.5* 30.5* 30.7* 33.1*  MCV 95.6  < > 88.9 91.4 92.4 90.6 92.5  PLT 128*  < > 127* 111* 102* 126* 162  < > = values in this interval not displayed. Blood Culture    Component Value Date/Time   SDES BLOOD RIGHT NECK 10/11/2014 1200   SPECREQUEST BOTTLES DRAWN AEROBIC AND ANAEROBIC 10CCS  10/11/2014 1200   CULT  10/11/2014 1200           BLOOD CULTURE RECEIVED NO GROWTH TO DATE CULTURE WILL BE HELD FOR 5 DAYS BEFORE ISSUING A FINAL NEGATIVE REPORT Note: Culture results may be compromised due to an excessive volume of blood received in culture bottles. Performed at [facility]    REPTSTATUS PENDING 10/11/2014 1200    Cardiac Enzymes:  Recent Labs Lab 10/10/14 1240  TROPONINI <0.03   CBG:  Recent Labs Lab  10/14/14 2116 10/15/14 1230 10/15/14 1654 10/15/14 2131 10/16/14 0748  GLUCAP 233* 162* 215* 172* 195*  Medications:   . antiseptic oral rinse  7 mL Mouth Rinse BID  . aspirin EC  81 mg Oral Daily  . calcitRIOL  1.25 mcg Oral Q M,W,F-HD  . darbepoetin (ARANESP) injection - DIALYSIS  60 mcg Intravenous Q Mon-HD  . feeding supplement (NEPRO CARB STEADY)  237 mL Oral BID BM  . heparin  5,000 Units Subcutaneous 3 times per day  . insulin aspart  0-9 Units Subcutaneous TID WC  . insulin aspart  5 Units Subcutaneous TID WC  . insulin glargine  12 Units Subcutaneous Daily  . levETIRAcetam  500 mg Oral Daily  . multivitamin  1 tablet Oral QHS  . pantoprazole  40 mg Oral BID  . polyethylene glycol  17 g Oral Daily  . senna-docusate  1 tablet Oral BID  . sevelamer carbonate  800 mg Oral TID WC  . sodium chloride  10-40 mL Intracatheter Q12H  . sodium chloride  3 mL Intravenous Q12H  . sodium chloride  3 mL Intravenous Q12H

## 2014-10-16 NOTE — Progress Notes (Signed)
Pt agreeing to not pull out R IJ. Verbalizes understanding of consequences of pulling out line. Pt requesting restraints to be removed. Sister at bedside. Bilateral wrist restraints removed. Will continue to monitor. Carrie MewJasmine Dilia Alemany, RN

## 2014-10-16 NOTE — Progress Notes (Addendum)
Pt is refusing to stay in bed or chair and is out of bed walking around. Explained to patient I would help her get washed up but she is refusing help. Pt wants to leave and wants her IJ removed right now. Explained necessity of line to patient and once again reinforced that this cannot be removed by myself or herself. Pt also refused for the NT to check her CBG so no insulin was given. Attempted to provide emotional support as patient is very frustrated. Will continue to monitor.   Carrie MewJasmine Quenten Nawaz, RN

## 2014-10-16 NOTE — Discharge Summary (Signed)
Physician Discharge Summary  Madeline [name] ZOX:[id number] DOB: 06-02-1955 DOA: 10/10/2014  PCP: Madeline HoitWILSON,Madeline HENRY, MD  Admit date: 10/10/2014 Discharge date: 10/16/2014  Time spent: 45 minutes  Recommendations for Outpatient Follow-up:  1. Dr.Wilson, PCP in 1 week, needs Outpatient GI referral for focal pancreatitis, needs further workup of this and repeat Imaging   Discharge Diagnoses:    Abd pain   ? Focal pancreatitis   DM (diabetes mellitus)   Hyperglycemia   ESRD on hemodialysis   Anemia   Abdominal pain   Encounter for central line placement   Fever   Discharge Condition: stable  Diet recommendation: Renal diabetic  Filed Weights   10/15/14 0725 10/15/14 1147 10/15/14 2134  Weight: 66.2 kg (145 lb 15.1 oz) 61.8 kg (136 lb 3.9 oz) 62.3 kg (137 lb 5.6 oz)    History of present illness:  Chief Complaint: abdominal pain HPI: 59 yo female with dm2, esrd on HD, c/o abdominal pain, diffuse starting while at dialysis. + n/v. No blood. Pt was brought to ED for evaluation and found to have bs 1374. Lipase nl, CT scan abdomen, pelvis=> showed fullness of the pancreatic head ? Pancreatitis but lipase was wnl. Pt will be admitted for hyperglycemia, and w/up of abdominal pain.   Hospital Course:  1. SIRS with Fever/Hypotension on 6/2, necessitating ICU transfer and temporary pressors -source unclear, CT abd with mild panc fullness and possible focal pancreatitis  -was started on IV Imipenem 6/2, Blood cx negative; suspect Viral or pancreatic inflammation as possible etiology of fever in absence of other source. -random cortisol 19 -since clinically improved, stable vitals, negative cultures, stopped Imipenem 6/4 -afebrile and stable off Abx, WBC normal  2. Encephalopathy -metabolic due to HONK , CBG 1300 on admission and high dose neurontin -improved, was also having upper extremity numbness and jerky movts prior to admission for 3-4weeks -Neuro consulted per Renal recs; felt  to be metabolic and started on daily Keppra for asterixes  -much improved, back to baseline  3. Abd pain -on admission due to mild focal pancreatitis noted on CT with normal Lipase without discrete mass or pancreatic ductal dilatation, will need GI FU for further workup and repeat imaging of pancreas  -Ca 19-9 normal,  -tolerating diet, pain mostly controlled, added laxatives for narcotic induced constipation  4. Brittle DM; CBgs were high and fluctuating -takes Lantus and humalog at home -Hbaic 15.5 -increase Novolog meal coverage and sensitive SSI, and  lantus resumed and dose incraesed -DM coordinator consulted as well, CBGs 160-215 improved   4. HTN -takes multiple meds at home, now on norvasc  5. H/o COPD/Pulm HTN and Right heart failure -stable  6. Anemia of chronic disease    Consultations:  Neurology  Renal  PCCM  Discharge Exam: Filed Vitals:   10/16/14 0939  BP: 160/63  Pulse: 64  Temp: 98.4 F (36.9 C)  Resp: 18    General: AAOx3 Cardiovascular: S1S2/RRR Respiratory: CTAB  Discharge Instructions    Current Discharge Medication List    START taking these medications   Details  levETIRAcetam (KEPPRA) 500 MG tablet Take 1 tablet (500 mg total) by mouth daily. Qty: 30 tablet, Refills: 0    oxyCODONE (OXY IR/ROXICODONE) 5 MG immediate release tablet Take 1 tablet (5 mg total) by mouth every 6 (six) hours as needed for moderate pain or severe pain. Qty: 30 tablet, Refills: 0    polyethylene glycol (MIRALAX / GLYCOLAX) packet Take 17 g by mouth daily. Qty: 14 each, Refills:  0      CONTINUE these medications which have CHANGED   Details  insulin glargine (LANTUS) 100 UNIT/ML injection Inject 0.12 mLs (12 Units total) into the skin daily. Qty: 10 mL, Refills: 1    traMADol (ULTRAM) 50 MG tablet Take 1 tablet (50 mg total) by mouth every 12 (twelve) hours as needed for moderate pain.      CONTINUE these medications which have NOT CHANGED    Details  acetaminophen (TYLENOL) 500 MG tablet Take 500-1,000 mg by mouth every 6 (six) hours as needed for moderate pain.    albuterol (PROVENTIL HFA;VENTOLIN HFA) 108 (90 BASE) MCG/ACT inhaler Inhale 2 puffs into the lungs every 6 (six) hours as needed for wheezing or shortness of breath.     amLODipine (NORVASC) 10 MG tablet Take 10 mg by mouth daily.     aspirin EC 81 MG tablet Take 81 mg by mouth daily.    calcitRIOL (ROCALTROL) 0.25 MCG capsule Take 0.25 mcg by mouth daily.    calcium acetate (PHOSLO) 667 MG capsule Take 667 mg by mouth 3 (three) times daily with meals.    carvedilol (COREG) 25 MG tablet Take 25 mg by mouth 2 (two) times daily with a meal.    ferrous sulfate 325 (65 FE) MG tablet Take 325 mg by mouth every Monday, Wednesday, and Friday with hemodialysis.     hydrALAZINE (APRESOLINE) 100 MG tablet Take 50 mg by mouth 3 (three) times daily.    insulin lispro (HUMALOG) 100 UNIT/ML injection Inject 3-10 Units into the skin 3 (three) times daily before meals. Sliding scale.  CBG 200: 3 UNITS, 250 5 UNITS, 300 7 UNITS, 350 9-10 UNITS    nitroGLYCERIN (NITROSTAT) 0.4 MG SL tablet Place 1 tablet (0.4 mg total) under the tongue every 5 (five) minutes as needed for chest pain. Qty: 30 tablet, Refills: 3    pantoprazole (PROTONIX) 40 MG tablet Take 1 tablet (40 mg total) by mouth 2 (two) times daily. Qty: 60 tablet, Refills: 1    promethazine (PHENERGAN) 25 MG tablet Take 25 mg by mouth 2 (two) times daily as needed for nausea or vomiting.     sevelamer carbonate (RENVELA) 800 MG tablet Take 800 mg by mouth 3 (three) times daily with meals.      STOP taking these medications     cloNIDine (CATAPRES) 0.1 MG tablet      gabapentin (NEURONTIN) 300 MG capsule      isosorbide mononitrate (IMDUR) 120 MG 24 hr tablet        No Known Allergies Follow-up Information    Follow up with Madeline Hoit, MD In 1 week.   Specialty:  Family Medicine   Contact information:    [address] [place] [address] [phone]       Follow up with GI specialist  In 1 month.   Why:  for further evaluation for pancreatitis       The results of significant diagnostics from this hospitalization (including imaging, microbiology, ancillary and laboratory) are listed below for reference.    Significant Diagnostic Studies: Ct Abdomen Pelvis W Contrast  10/10/2014   CLINICAL DATA:  Severe abdominal pain for 1 week. Chronic kidney disease on dialysis.  EXAM: CT ABDOMEN AND PELVIS WITH CONTRAST  TECHNIQUE: Multidetector CT imaging of the abdomen and pelvis was performed using the standard protocol following bolus administration of intravenous contrast.  CONTRAST:  OMNIPAQUE IOHEXOL 300 MG/ML  SOLN  COMPARISON:  01/07/2014  FINDINGS: Lower Chest:  Unremarkable.  Hepatobiliary: No masses or other significant abnormality identified. Gallbladder is unremarkable.  Pancreas: Enlargement and heterogeneous attenuation is seen involving the pancreatic head and uncinate process, without evidence of a discrete mass. This is suspicious for mild acute pancreatitis. No evidence of pancreatic necrosis or peripancreatic fluid collections. No evidence of pancreatic ductal dilatation.  Spleen:  Within normal limits in size and appearance.  Adrenals:  No masses identified.  Kidneys/Urinary Tract:  No evidence of masses or hydronephrosis.  Stomach/Bowel/Peritoneum: A small hiatal hernia is seen and there is also wall thickening of the distal esophagus suspicious for reflux esophagitis. No evidence of bowel wall thickening or dilatation. Normal appendix visualized.  Vascular/Lymphatic: No pathologically enlarged lymph nodes identified. No other significant abnormality visualized.  Reproductive: Prior hysterectomy noted. Adnexal regions are unremarkable in appearance.  Other:  None.  Musculoskeletal:  No suspicious bone lesions identified.  IMPRESSION: Swelling of pancreatic head and uncinate  process, without evidence of discrete mass or pancreatic ductal dilatation. This is suspicious for focal pancreatitis. Recommend clinical correlation with serum amylase and lipase levels.  Small hiatal hernia, with distal esophageal wall thickening suspicious for reflux esophagitis. Consider endoscopy for further evaluation.   Electronically Signed   By: Myles Rosenthal M.D.   On: 10/10/2014 17:23   Dg Chest Port 1 View  10/11/2014   CLINICAL DATA:  Central line placement.  EXAM: PORTABLE CHEST - 1 VIEW  COMPARISON:  Same day.  FINDINGS: Stable cardiomediastinal silhouette. No pneumothorax or significant pleural effusion is noted. No acute pulmonary disease is noted. Interval placement of right internal jugular catheter line with distal tip at the expected position of the cavoatrial junction. Bony thorax is intact.  IMPRESSION: Interval placement of right internal jugular catheter line with distal tip at expected position of cavoatrial junction. No pneumothorax is noted.   Electronically Signed   By: Lupita Raider, M.D.   On: 10/11/2014 13:25   Dg Chest Port 1 View  10/11/2014   CLINICAL DATA:  Fevers  EXAM: PORTABLE CHEST - 1 VIEW  COMPARISON:  01/07/2014  FINDINGS: The heart size and mediastinal contours are within normal limits. Both lungs are clear. The visualized skeletal structures are unremarkable.  IMPRESSION: No active disease.   Electronically Signed   By: Alcide Clever M.D.   On: 10/11/2014 07:07    Microbiology: Recent Results (from the past 240 hour(s))  MRSA PCR Screening     Status: None   Collection Time: 10/10/14  8:33 PM  Result Value Ref Range Status   MRSA by PCR NEGATIVE NEGATIVE Final    Comment:        The GeneXpert MRSA Assay (FDA approved for NASAL specimens only), is one component of a comprehensive MRSA colonization surveillance program. It is not intended to diagnose MRSA infection nor to guide or monitor treatment for MRSA infections.   Culture, blood (routine x 2)      Status: None (Preliminary result)   Collection Time: 10/11/14  7:55 AM  Result Value Ref Range Status   Specimen Description BLOOD RIGHT WRIST  Final   Special Requests BOTTLES DRAWN AEROBIC ONLY 4CC  Final   Culture   Final           BLOOD CULTURE RECEIVED NO GROWTH TO DATE CULTURE WILL BE HELD FOR 5 DAYS BEFORE ISSUING A FINAL NEGATIVE REPORT Performed at [facility]    Report Status PENDING  Incomplete  Culture, Urine  Status: None   Collection Time: 10/11/14  8:23 AM  Result Value Ref Range Status   Specimen Description URINE, CATHETERIZED  Final   Special Requests NONE  Final   Colony Count NO GROWTH Performed at [facility]   Final   Culture NO GROWTH Performed at [facility]   Final   Report Status 10/12/2014 FINAL  Final  Culture, blood (routine x 2)     Status: None (Preliminary result)   Collection Time: 10/11/14 12:00 PM  Result Value Ref Range Status   Specimen Description BLOOD RIGHT NECK  Final   Special Requests BOTTLES DRAWN AEROBIC AND ANAEROBIC 10CCS   Final   Culture   Final           BLOOD CULTURE RECEIVED NO GROWTH TO DATE CULTURE WILL BE HELD FOR 5 DAYS BEFORE ISSUING A FINAL NEGATIVE REPORT Note: Culture results may be compromised due to an excessive volume of blood received in culture bottles. Performed at [facility]    Report Status PENDING  Incomplete     Labs: Basic Metabolic Panel:  Recent Labs Lab 10/11/14 1050 10/12/14 1530 10/14/14 0815 10/15/14 0756 10/16/14 0800  NA 135 133* 133* 133* 135  K 3.3* 3.7 4.2 3.7 3.4*  CL 102 103 97* 99* 97*  CO2 24 22 26 25 27   GLUCOSE 117* 279* 609* 311* 229*  BUN 14 27* 24* 29* 12  CREATININE 3.38* 4.44* 3.67* 4.10* 3.00*  CALCIUM 8.1* 8.1* 8.5* 8.5* 8.2*  PHOS 2.3* 3.0  --  2.2*  --    Liver Function Tests:  Recent Labs Lab 10/10/14 1240 10/11/14 0520 10/11/14 1050 10/12/14 1530 10/14/14 0815 10/15/14 0756 10/16/14 0800  AST 58* 52* 53*  --  55*   --  57*  ALT 41 34 28  --  24  --  21  ALKPHOS 334* 222* 165*  --  202*  --  158*  BILITOT 0.9 0.4 0.4  --  0.9  --  0.9  PROT 6.8 5.5* 4.2*  --  5.2*  --  5.3*  ALBUMIN 3.5 2.7* 2.1*  2.1* 2.2* 2.2* 2.2* 2.3*    Recent Labs Lab 10/10/14 1240 10/16/14 0800  LIPASE 42 127*    Recent Labs Lab 10/13/14 2056  AMMONIA 44*   CBC:  Recent Labs Lab 10/10/14 1240  10/11/14 1050 10/12/14 1530 10/14/14 0815 10/15/14 0755 10/16/14 0420  WBC 4.0  < > 11.2* 8.6 5.1 5.7 7.5  NEUTROABS 3.2  --   --   --   --   --   --   HGB 11.4*  < > 10.0* 9.4* 10.2* 10.4* 11.1*  HCT 35.1*  < > 28.0* 27.5* 30.5* 30.7* 33.1*  MCV 95.6  < > 88.9 91.4 92.4 90.6 92.5  PLT 128*  < > 127* 111* 102* 126* 162  < > = values in this interval not displayed. Cardiac Enzymes:  Recent Labs Lab 10/10/14 1240  TROPONINI <0.03   BNP: BNP (last 3 results) No results for input(s): BNP in the last 8760 hours.  ProBNP (last 3 results) No results for input(s): PROBNP in the last 8760 hours.  CBG:  Recent Labs Lab 10/14/14 2116 10/15/14 1230 10/15/14 1654 10/15/14 2131 10/16/14 0748  GLUCAP 233* 162* 215* 172* 195*       Signed:  Yoshua Geisinger  [facility] 10/16/2014, 2:30 PM

## 2014-10-16 NOTE — Progress Notes (Signed)
Physical Therapy Treatment Patient Details Name: Madeline [name] BadDorine Mortellaro MRN: [id number] DOB: Oct 24, 1955 Today's Date: 10/16/2014    History of Present Illness Patient is a 59 y.o. female admitted 10/10/14 with hyperglycemia (1374), N/V, abdominal pain, metabolic encephalopathy. Patient with HONK, pancreatitis, asterixis.  On 10/11/14 patient with hypotension and SIRS, transferred to ICU.  Now on Renal unit.  PMH:  ESRD on HD MWF, DM, CHF, COPD, CAD, HTN, CVA, falls    PT Comments    Pt appeared to be oriented appropriately but refused to participate in any PT care. Pt did not respond to majority of questions, except shaking head no to offers to get out of bed. Pt continued to refuse participation despite RN Jasmine's attempt to emphasize importance of PT in order to go home. Able to discern that it was more of a participation issue and not a level of arousal issue when pt was able to verbalize her wishes to be more comfortable in the bed. Due to decreased participation in PT, unable to make a judgement call about her safety with ambulation/mobility in order to d/c to home.  Per RN Jasmine's note after session, it looks like she has been up and walking around the room. On PT 2 days prior, she had a noted ataxic gait. Would consider safe d/c status with more information about 24 superversion at home.  Follow Up Recommendations  Home health PT;Supervision/Assistance - 24 hour  Home health RN for chronic disease management   Equipment Recommendations  None recommended by PT    Recommendations for Other Services       Precautions / Restrictions Precautions Precautions: Fall Precaution Comments: h/o falls Restrictions Weight Bearing Restrictions: No    Mobility  Bed Mobility Overal bed mobility: Needs Assistance Bed Mobility: Sit to Supine       Sit to supine: Supervision   General bed mobility comments: PT cleared pts legs off bed in an attempt to see if initiating movement would garner more  participation and pt voluntarily brought them back onto the bed.  Transfers                    Ambulation/Gait                 Stairs            Wheelchair Mobility    Modified Rankin (Stroke Patients Only)       Balance                                    Cognition Arousal/Alertness: (did not respond to questions except shaking head no when asked about getting up) Behavior During Therapy: Flat affect (Refused to participate in session. Pretended to sleep.) Overall Cognitive Status: Difficult to assess                      Exercises      General Comments General comments (skin integrity, edema, etc.): Pt was awake but did not respond to any and all PT questions. The only respose that was recieved during the session was verbal commands to adjust her level of comfort as the session was concluded.      Pertinent Vitals/Pain      Home Living                      Prior Function  PT Goals (current goals can now be found in the care plan section) Progress towards PT goals: PT to reassess next treatment    Frequency  Min 3X/week    PT Plan      Co-evaluation             End of Session   Activity Tolerance: Treatment limited secondary to agitation Patient left: in bed;with call bell/phone within reach;with bed alarm set     Time: 1610-9604 PT Time Calculation (min) (ACUTE ONLY): 13 min  Charges:                       G CodesSharmon Leyden 11-05-14, 11:47 AM   Sharmon Leyden, SPT Acute Rehabilitation Services Office: [phone]

## 2014-10-17 LAB — CULTURE, BLOOD (ROUTINE X 2)
CULTURE: NO GROWTH
Culture: NO GROWTH

## 2014-10-22 ENCOUNTER — Encounter (HOSPITAL_COMMUNITY): Payer: Self-pay | Admitting: Emergency Medicine

## 2014-10-22 ENCOUNTER — Inpatient Hospital Stay (HOSPITAL_COMMUNITY)
Admission: EM | Admit: 2014-10-22 | Discharge: 2014-10-26 | DRG: 637 | Disposition: A | Discharge status: Home / Self Care | Payer: Medicaid Other | Attending: Internal Medicine | Admitting: Internal Medicine

## 2014-10-22 DIAGNOSIS — I5032 Chronic diastolic (congestive) heart failure: Secondary | ICD-10-CM | POA: Diagnosis present

## 2014-10-22 DIAGNOSIS — G9340 Encephalopathy, unspecified: Secondary | ICD-10-CM | POA: Diagnosis present

## 2014-10-22 DIAGNOSIS — Z7982 Long term (current) use of aspirin: Secondary | ICD-10-CM

## 2014-10-22 DIAGNOSIS — E870 Hyperosmolality and hypernatremia: Secondary | ICD-10-CM | POA: Diagnosis present

## 2014-10-22 DIAGNOSIS — R112 Nausea with vomiting, unspecified: Secondary | ICD-10-CM | POA: Diagnosis present

## 2014-10-22 DIAGNOSIS — N186 End stage renal disease: Secondary | ICD-10-CM

## 2014-10-22 DIAGNOSIS — Q453 Other congenital malformations of pancreas and pancreatic duct: Secondary | ICD-10-CM

## 2014-10-22 DIAGNOSIS — E1310 Other specified diabetes mellitus with ketoacidosis without coma: Principal | ICD-10-CM | POA: Diagnosis present

## 2014-10-22 DIAGNOSIS — R739 Hyperglycemia, unspecified: Secondary | ICD-10-CM

## 2014-10-22 DIAGNOSIS — R531 Weakness: Secondary | ICD-10-CM

## 2014-10-22 DIAGNOSIS — I2510 Atherosclerotic heart disease of native coronary artery without angina pectoris: Secondary | ICD-10-CM | POA: Diagnosis present

## 2014-10-22 DIAGNOSIS — G4733 Obstructive sleep apnea (adult) (pediatric): Secondary | ICD-10-CM | POA: Diagnosis present

## 2014-10-22 DIAGNOSIS — F17220 Nicotine dependence, chewing tobacco, uncomplicated: Secondary | ICD-10-CM | POA: Diagnosis present

## 2014-10-22 DIAGNOSIS — I252 Old myocardial infarction: Secondary | ICD-10-CM

## 2014-10-22 DIAGNOSIS — Z9119 Patient's noncompliance with other medical treatment and regimen: Secondary | ICD-10-CM | POA: Diagnosis present

## 2014-10-22 DIAGNOSIS — E1110 Type 2 diabetes mellitus with ketoacidosis without coma: Secondary | ICD-10-CM | POA: Diagnosis present

## 2014-10-22 DIAGNOSIS — D649 Anemia, unspecified: Secondary | ICD-10-CM | POA: Diagnosis present

## 2014-10-22 DIAGNOSIS — R935 Abnormal findings on diagnostic imaging of other abdominal regions, including retroperitoneum: Secondary | ICD-10-CM | POA: Diagnosis present

## 2014-10-22 DIAGNOSIS — I10 Essential (primary) hypertension: Secondary | ICD-10-CM | POA: Diagnosis present

## 2014-10-22 DIAGNOSIS — N2581 Secondary hyperparathyroidism of renal origin: Secondary | ICD-10-CM | POA: Diagnosis present

## 2014-10-22 DIAGNOSIS — I272 Other secondary pulmonary hypertension: Secondary | ICD-10-CM | POA: Diagnosis present

## 2014-10-22 DIAGNOSIS — Z8673 Personal history of transient ischemic attack (TIA), and cerebral infarction without residual deficits: Secondary | ICD-10-CM

## 2014-10-22 DIAGNOSIS — J449 Chronic obstructive pulmonary disease, unspecified: Secondary | ICD-10-CM | POA: Diagnosis present

## 2014-10-22 DIAGNOSIS — R410 Disorientation, unspecified: Secondary | ICD-10-CM

## 2014-10-22 DIAGNOSIS — Z794 Long term (current) use of insulin: Secondary | ICD-10-CM

## 2014-10-22 DIAGNOSIS — I120 Hypertensive chronic kidney disease with stage 5 chronic kidney disease or end stage renal disease: Secondary | ICD-10-CM | POA: Diagnosis present

## 2014-10-22 DIAGNOSIS — Z992 Dependence on renal dialysis: Secondary | ICD-10-CM

## 2014-10-22 HISTORY — DX: Cerebral infarction, unspecified: I63.9

## 2014-10-22 LAB — CBC WITH DIFFERENTIAL/PLATELET
BASOS ABS: 0 10*3/uL (ref 0.0–0.1)
Basophils Relative: 0 % (ref 0–1)
EOS ABS: 0 10*3/uL (ref 0.0–0.7)
EOS PCT: 0 % (ref 0–5)
HCT: 36.4 % (ref 36.0–46.0)
HEMOGLOBIN: 12.2 g/dL (ref 12.0–15.0)
LYMPHS ABS: 1.2 10*3/uL (ref 0.7–4.0)
Lymphocytes Relative: 15 % (ref 12–46)
MCH: 31.8 pg (ref 26.0–34.0)
MCHC: 33.5 g/dL (ref 30.0–36.0)
MCV: 94.8 fL (ref 78.0–100.0)
MONOS PCT: 3 % (ref 3–12)
Monocytes Absolute: 0.3 10*3/uL (ref 0.1–1.0)
NEUTROS ABS: 6.4 10*3/uL (ref 1.7–7.7)
NEUTROS PCT: 82 % — AB (ref 43–77)
PLATELETS: 260 10*3/uL (ref 150–400)
RBC: 3.84 MIL/uL — AB (ref 3.87–5.11)
RDW: 13.6 % (ref 11.5–15.5)
WBC: 7.9 10*3/uL (ref 4.0–10.5)

## 2014-10-22 NOTE — ED Provider Notes (Signed)
CSN: [id number]     Arrival date & time 10/22/14  2302 History   This chart was scribed for Tomasita Crumble, MD by Octavia Heir, ED Scribe. This patient was seen in room A04C/A04C and the patient's care was started at 12:06 AM.    Chief Complaint  Patient presents with  . Hyperglycemia     The history is provided by the patient. No language interpreter was used.    HPI Comments: Madeline [name] is a 59 y.o. female who presents to the Emergency Department complaining of hyperglycemia. Pt has associated abdominal pain that is a cramping sensation onset early this morning. Pt reports having generalized weakness, vomiting, diarrhea, and fever. Per daughter, she gave her 2 units to try and reduce her blood sugar levels PTA. Daughter notes pt is more confused than normal. Pt went to dialysis Friday, but missed today. Pt has a past medical history of having problems with her abdominal area. Per daughter, pt was diagnosed with hyperglycemic sepsis late last week. Daughter denies blood in stool and her vomit.   Past Medical History  Diagnosis Date  . CHF (congestive heart failure)     a. HFpEF with RHF/anasarca/pHTN.  . COPD (chronic obstructive pulmonary disease)   . Coronary artery disease     a. Per [place] records: NSTEMI 01/2012, tx medically given ARF but suspected CAD. b. Stress test 12/16/11 reported w/o ischemia.  . Hypertension   . Diabetes mellitus   . OSA (obstructive sleep apnea)     a. Pt reported used to use CPAP in [place], but "ran out" when came to [facility].  Marland Kitchen CKD (chronic kidney disease), stage III     a. Per [place] records: h/o ARF after CTA that ruled out PE.  Marland Kitchen Pulmonary hypertension     a. RHC 02/28/13: mod pulm HTN with normal PVR suggestive of predominantly pulmonary venous HTN.  . Right heart failure   . Anasarca     a. Per [place] records - due to pulm HTN with R HF.   Marland Kitchen Aseptic meningitis     a. 09/2012: adm in [place] for metabolic encephalopathy, oliguric tubular necrosis, anemia, HTN, possible CVA,  HHNKA.  Marland Kitchen CVA (cerebral infarction)     a. Per [place] records, "possible CVA" 09/2012 but MRI reportedly negative.  . Abnormal Doppler ultrasound of carotid artery     a. Per [place] records: <50% LICA.  Marland Kitchen Renal insufficiency    Past Surgical History  Procedure Laterality Date  . Tubal ligation    . Abdominal hysterectomy    . Esophagogastroduodenoscopy N/A 02/16/2013    Procedure: ESOPHAGOGASTRODUODENOSCOPY (EGD);  Surgeon: Graylin Shiver, MD;  Location[facility] ENDOSCOPY;  Service: Endoscopy;  Laterality: N/A;  . Insertion of dialysis catheter Right 03/03/2013    Procedure: INSERTION OF DIALYSIS CATHETER;  Surgeon: Larina Earthly, MD;  Location: [facility] OR;  Service: Vascular;  Laterality: Right;  Right Internal Jugular Placement  . Av fistula placement Left 03/03/2013    Procedure: ARTERIOVENOUS (AV) FISTULA CREATION, Brachial/Cephalic;  Surgeon: Larina Earthly, MD;  Location: [facility] OR;  Service: Vascular;  Laterality: Left;  . Right heart catheterization N/A 02/28/2013    Procedure: RIGHT HEART CATH;  Surgeon: Dolores Patty, MD;  Location: [facility] CATH LAB;  Service: Cardiovascular;  Laterality: N/A;  . Shuntogram N/A 07/13/2013    Procedure: FISTULOGRAM;  Surgeon: Fransisco Hertz, MD;  Location: [facility] CATH LAB;  Service: Cardiovascular;  Laterality: N/A;   Family History  Problem Relation Age of  Onset  . Diabetes Mellitus II Sister   . Diabetes Mellitus II Brother   . CAD Brother    History  Substance Use Topics  . Smoking status: Former Games developer  . Smokeless tobacco: Current User    Types: Snuff, Chew  . Alcohol Use: No   OB History    No data available     Review of Systems  A complete 10 system review of systems was obtained and all systems are negative except as noted in the HPI and PMH.    Allergies  Review of patient's allergies indicates no known allergies.  Home Medications   Prior to Admission medications   Medication Sig Start Date End Date Taking? Authorizing Provider  acetaminophen  (TYLENOL) 500 MG tablet Take 500-1,000 mg by mouth every 6 (six) hours as needed for moderate pain.    Historical Provider, MD  albuterol (PROVENTIL HFA;VENTOLIN HFA) 108 (90 BASE) MCG/ACT inhaler Inhale 2 puffs into the lungs every 6 (six) hours as needed for wheezing or shortness of breath.     Historical Provider, MD  amLODipine (NORVASC) 10 MG tablet Take 10 mg by mouth daily.     Historical Provider, MD  aspirin EC 81 MG tablet Take 81 mg by mouth daily.    Historical Provider, MD  calcitRIOL (ROCALTROL) 0.25 MCG capsule Take 0.25 mcg by mouth daily.    Historical Provider, MD  calcium acetate (PHOSLO) 667 MG capsule Take 667 mg by mouth 3 (three) times daily with meals.    Historical Provider, MD  carvedilol (COREG) 25 MG tablet Take 25 mg by mouth 2 (two) times daily with a meal.    Historical Provider, MD  ferrous sulfate 325 (65 FE) MG tablet Take 325 mg by mouth every Monday, Wednesday, and Friday with hemodialysis.     Historical Provider, MD  hydrALAZINE (APRESOLINE) 100 MG tablet Take 50 mg by mouth 3 (three) times daily.    Historical Provider, MD  insulin glargine (LANTUS) 100 UNIT/ML injection Inject 0.12 mLs (12 Units total) into the skin daily. 10/16/14   Zannie Cove, MD  insulin lispro (HUMALOG) 100 UNIT/ML injection Inject 3-10 Units into the skin 3 (three) times daily before meals. Sliding scale.  CBG 200: 3 UNITS, 250 5 UNITS, 300 7 UNITS, 350 9-10 UNITS    Historical Provider, MD  levETIRAcetam (KEPPRA) 500 MG tablet Take 1 tablet (500 mg total) by mouth daily. 10/16/14   Zannie Cove, MD  nitroGLYCERIN (NITROSTAT) 0.4 MG SL tablet Place 1 tablet (0.4 mg total) under the tongue every 5 (five) minutes as needed for chest pain. 07/05/14   Dolores Patty, MD  oxyCODONE (OXY IR/ROXICODONE) 5 MG immediate release tablet Take 1 tablet (5 mg total) by mouth every 6 (six) hours as needed for moderate pain or severe pain. 10/16/14   Zannie Cove, MD  pantoprazole (PROTONIX) 40 MG  tablet Take 1 tablet (40 mg total) by mouth 2 (two) times daily. 06/05/13   Coolidge Breeze, MD  polyethylene glycol (MIRALAX / GLYCOLAX) packet Take 17 g by mouth daily. 10/16/14   Zannie Cove, MD  promethazine (PHENERGAN) 25 MG tablet Take 25 mg by mouth 2 (two) times daily as needed for nausea or vomiting.     Historical Provider, MD  sevelamer carbonate (RENVELA) 800 MG tablet Take 800 mg by mouth 3 (three) times daily with meals.    Historical Provider, MD  traMADol (ULTRAM) 50 MG tablet Take 1 tablet (50 mg total) by mouth every  12 (twelve) hours as needed for moderate pain. 10/16/14   Zannie Cove, MD   Triage vitals: BP 170/83 mmHg  Pulse 64  Temp(Src) 98 F (36.7 C) (Oral)  Resp 16  SpO2  Physical Exam  Constitutional: She is oriented to person, place, and time. She appears well-developed and well-nourished. No distress.  HENT:  Head: Normocephalic and atraumatic.  Nose: Nose normal.  Mouth/Throat: Oropharynx is clear and moist. No oropharyngeal exudate.  Eyes: Conjunctivae and EOM are normal. Pupils are equal, round, and reactive to light. No scleral icterus.  Neck: Normal range of motion. Neck supple. No JVD present. No tracheal deviation present. No thyromegaly present.  Cardiovascular: Normal rate, regular rhythm and normal heart sounds.  Exam reveals no gallop and no friction rub.   No murmur heard. Pulmonary/Chest: Effort normal and breath sounds normal. No respiratory distress. She has no wheezes. She exhibits no tenderness.  Abdominal: Soft. Bowel sounds are normal. She exhibits no distension and no mass. There is no tenderness. There is no rebound and no guarding.  Musculoskeletal: Normal range of motion. She exhibits no edema or tenderness.  left upper ex palpable to thrill  Lymphadenopathy:    She has no cervical adenopathy.  Neurological: She is alert and oriented to person, place, and time. No cranial nerve deficit. She exhibits normal muscle tone.  Slow to  respond to questioning confused  Skin: Skin is warm and dry. No rash noted. No erythema. No pallor.  Nursing note and vitals reviewed.   ED Course  Procedures   COORDINATION OF CARE:  12:10 AM Discussed treatment plan which includes lab work, IV, imaging with pt at bedside and pt agreed to plan.   Labs Review Labs Reviewed  CBC WITH DIFFERENTIAL/PLATELET - Abnormal; Notable for the following:    RBC 3.84 (*)    Neutrophils Relative % 82 (*)    All other components within normal limits  COMPREHENSIVE METABOLIC PANEL - Abnormal; Notable for the following:    Sodium 131 (*)    Chloride 91 (*)    CO2 20 (*)    Glucose, Bld 819 (*)    Creatinine, Ser 3.34 (*)    Albumin 3.1 (*)    Alkaline Phosphatase 199 (*)    Total Bilirubin 1.3 (*)    GFR calc non Af Amer 14 (*)    GFR calc Af Amer 17 (*)    Anion gap 20 (*)    All other components within normal limits  CBG MONITORING, ED - Abnormal; Notable for the following:    Glucose-Capillary >600 (*)    All other components within normal limits  CBG MONITORING, ED - Abnormal; Notable for the following:    Glucose-Capillary >600 (*)    All other components within normal limits  URINALYSIS, ROUTINE W REFLEX MICROSCOPIC (NOT AT [facility])  URINE RAPID DRUG SCREEN, HOSP PERFORMED    Imaging Review Dg Chest 2 View  10/23/2014   CLINICAL DATA:  Weakness.  COPD.  CHF.  EXAM: CHEST  2 VIEW  COMPARISON:  10/11/2014  FINDINGS: Normal heart size and pulmonary vascularity. No focal airspace disease or consolidation in the lungs. No blunting of costophrenic angles. No pneumothorax. Mediastinal contours appear intact. Calcification of the aorta. Previous resection or resorption of the distal left clavicle.  IMPRESSION: No active cardiopulmonary disease.   Electronically Signed   By: Burman Nieves M.D.   On: 10/23/2014 01:38     EKG Interpretation None      MDM  Final diagnoses:  None  Patient presents to the ED for hyperglycemia and  confusion for the past 2 days.   She was recently admitted for the same thing in addition had pancreatitis and bacteremia. Today blood sugars over 800. I gave 10 units of IV insulin. I spoke with [facility] hospitalist, Dr. Teddy Spike, so but the patient to step down for further management. Chest x-ray is negative, rectal temp is afebrile. CT head is pending.  I personally performed the services described in this documentation, which was scribed in my presence. The recorded information has been reviewed and is accurate.   Tomasita Crumble, MD 10/23/14 1610

## 2014-10-22 NOTE — ED Notes (Signed)
Family reported elevated blood sugar at home " high"  this evening with fatigue and generalized weakness, pt. did not go to her hemodialysis last Friday . Respirations unlabored / denies fever or chills.

## 2014-10-23 ENCOUNTER — Inpatient Hospital Stay (HOSPITAL_COMMUNITY): Payer: Medicaid Other

## 2014-10-23 ENCOUNTER — Emergency Department (HOSPITAL_COMMUNITY): Payer: Medicaid Other

## 2014-10-23 ENCOUNTER — Encounter (HOSPITAL_COMMUNITY): Payer: Self-pay

## 2014-10-23 DIAGNOSIS — J449 Chronic obstructive pulmonary disease, unspecified: Secondary | ICD-10-CM | POA: Diagnosis present

## 2014-10-23 DIAGNOSIS — Z794 Long term (current) use of insulin: Secondary | ICD-10-CM | POA: Diagnosis not present

## 2014-10-23 DIAGNOSIS — F17220 Nicotine dependence, chewing tobacco, uncomplicated: Secondary | ICD-10-CM | POA: Diagnosis present

## 2014-10-23 DIAGNOSIS — Z8673 Personal history of transient ischemic attack (TIA), and cerebral infarction without residual deficits: Secondary | ICD-10-CM | POA: Diagnosis not present

## 2014-10-23 DIAGNOSIS — Z7982 Long term (current) use of aspirin: Secondary | ICD-10-CM | POA: Diagnosis not present

## 2014-10-23 DIAGNOSIS — E1310 Other specified diabetes mellitus with ketoacidosis without coma: Secondary | ICD-10-CM | POA: Diagnosis present

## 2014-10-23 DIAGNOSIS — R410 Disorientation, unspecified: Secondary | ICD-10-CM | POA: Diagnosis present

## 2014-10-23 DIAGNOSIS — E870 Hyperosmolality and hypernatremia: Secondary | ICD-10-CM | POA: Diagnosis present

## 2014-10-23 DIAGNOSIS — E1101 Type 2 diabetes mellitus with hyperosmolarity with coma: Secondary | ICD-10-CM

## 2014-10-23 DIAGNOSIS — I272 Other secondary pulmonary hypertension: Secondary | ICD-10-CM | POA: Diagnosis present

## 2014-10-23 DIAGNOSIS — N2581 Secondary hyperparathyroidism of renal origin: Secondary | ICD-10-CM | POA: Diagnosis present

## 2014-10-23 DIAGNOSIS — R935 Abnormal findings on diagnostic imaging of other abdominal regions, including retroperitoneum: Secondary | ICD-10-CM | POA: Diagnosis not present

## 2014-10-23 DIAGNOSIS — I10 Essential (primary) hypertension: Secondary | ICD-10-CM | POA: Diagnosis not present

## 2014-10-23 DIAGNOSIS — Z9119 Patient's noncompliance with other medical treatment and regimen: Secondary | ICD-10-CM | POA: Diagnosis present

## 2014-10-23 DIAGNOSIS — R112 Nausea with vomiting, unspecified: Secondary | ICD-10-CM | POA: Diagnosis present

## 2014-10-23 DIAGNOSIS — Z992 Dependence on renal dialysis: Secondary | ICD-10-CM | POA: Diagnosis not present

## 2014-10-23 DIAGNOSIS — G9340 Encephalopathy, unspecified: Secondary | ICD-10-CM | POA: Diagnosis not present

## 2014-10-23 DIAGNOSIS — N186 End stage renal disease: Secondary | ICD-10-CM | POA: Diagnosis present

## 2014-10-23 DIAGNOSIS — D649 Anemia, unspecified: Secondary | ICD-10-CM | POA: Diagnosis present

## 2014-10-23 DIAGNOSIS — I5032 Chronic diastolic (congestive) heart failure: Secondary | ICD-10-CM | POA: Diagnosis present

## 2014-10-23 DIAGNOSIS — G4733 Obstructive sleep apnea (adult) (pediatric): Secondary | ICD-10-CM | POA: Diagnosis present

## 2014-10-23 DIAGNOSIS — I2510 Atherosclerotic heart disease of native coronary artery without angina pectoris: Secondary | ICD-10-CM | POA: Diagnosis present

## 2014-10-23 DIAGNOSIS — I252 Old myocardial infarction: Secondary | ICD-10-CM | POA: Diagnosis not present

## 2014-10-23 DIAGNOSIS — I120 Hypertensive chronic kidney disease with stage 5 chronic kidney disease or end stage renal disease: Secondary | ICD-10-CM | POA: Diagnosis present

## 2014-10-23 DIAGNOSIS — E1110 Type 2 diabetes mellitus with ketoacidosis without coma: Secondary | ICD-10-CM | POA: Diagnosis present

## 2014-10-23 LAB — GLUCOSE, CAPILLARY
GLUCOSE-CAPILLARY: 583 mg/dL — AB (ref 65–99)
GLUCOSE-CAPILLARY: 114 mg/dL — AB (ref 65–99)
GLUCOSE-CAPILLARY: 104 mg/dL — AB (ref 65–99)
GLUCOSE-CAPILLARY: 294 mg/dL — AB (ref 65–99)
Glucose-Capillary: 580 mg/dL (ref 65–99)
Glucose-Capillary: 578 mg/dL (ref 65–99)
Glucose-Capillary: >600 mg/dL (ref 65–99)
Glucose-Capillary: 362 mg/dL — ABNORMAL HIGH (ref 65–99)
Glucose-Capillary: 186 mg/dL — ABNORMAL HIGH (ref 65–99)
Glucose-Capillary: 70 mg/dL (ref 65–99)
Glucose-Capillary: 132 mg/dL — ABNORMAL HIGH (ref 65–99)
Glucose-Capillary: 242 mg/dL — ABNORMAL HIGH (ref 65–99)
Glucose-Capillary: 296 mg/dL — ABNORMAL HIGH (ref 65–99)

## 2014-10-23 LAB — COMPREHENSIVE METABOLIC PANEL
ALBUMIN: 3.1 g/dL — AB (ref 3.5–5.0)
ALT: 21 U/L (ref 14–54)
ANION GAP: 20 — AB (ref 5–15)
AST: 32 U/L (ref 15–41)
Alkaline Phosphatase: 199 U/L — ABNORMAL HIGH (ref 38–126)
BILIRUBIN TOTAL: 1.3 mg/dL — AB (ref 0.3–1.2)
BUN: 19 mg/dL (ref 6–20)
CO2: 20 mmol/L — AB (ref 22–32)
CREATININE: 3.34 mg/dL — AB (ref 0.44–1.00)
Calcium: 9.1 mg/dL (ref 8.9–10.3)
Chloride: 91 mmol/L — ABNORMAL LOW (ref 101–111)
GFR calc Af Amer: 17 mL/min — ABNORMAL LOW (ref >60)
GFR, EST NON AFRICAN AMERICAN: 14 mL/min — AB (ref >60)
GLUCOSE: 819 mg/dL — AB (ref 65–99)
Potassium: 4.1 mmol/L (ref 3.5–5.1)
Sodium: 131 mmol/L — ABNORMAL LOW (ref 135–145)
Total Protein: 6.6 g/dL (ref 6.5–8.1)

## 2014-10-23 LAB — CBC
HCT: 36.1 % (ref 36.0–46.0)
Hemoglobin: 11.9 g/dL — ABNORMAL LOW (ref 12.0–15.0)
MCH: 31.2 pg (ref 26.0–34.0)
MCHC: 33 g/dL (ref 30.0–36.0)
MCV: 94.5 fL (ref 78.0–100.0)
Platelets: 209 10*3/uL (ref 150–400)
RBC: 3.82 MIL/uL — ABNORMAL LOW (ref 3.87–5.11)
RDW: 13.7 % (ref 11.5–15.5)
WBC: 8.1 10*3/uL (ref 4.0–10.5)

## 2014-10-23 LAB — BLOOD GAS, ARTERIAL
Acid-base deficit: 2.2 mmol/L — ABNORMAL HIGH (ref 0.0–2.0)
Bicarbonate: 20.6 mEq/L (ref 20.0–24.0)
Drawn by: 252031
O2 Saturation: 98.6 %
Patient temperature: 98.6
TCO2: 21.4 mmol/L (ref 0–100)
pCO2 arterial: 26.4 mmHg — ABNORMAL LOW (ref 35.0–45.0)
pH, Arterial: 7.502 — ABNORMAL HIGH (ref 7.350–7.450)
pO2, Arterial: 105 mmHg — ABNORMAL HIGH (ref 80.0–100.0)

## 2014-10-23 LAB — BASIC METABOLIC PANEL
ANION GAP: 10 (ref 5–15)
BUN: 22 mg/dL — ABNORMAL HIGH (ref 6–20)
CO2: 30 mmol/L (ref 22–32)
Calcium: 9.4 mg/dL (ref 8.9–10.3)
Chloride: 104 mmol/L (ref 101–111)
Creatinine, Ser: 3.5 mg/dL — ABNORMAL HIGH (ref 0.44–1.00)
GFR calc Af Amer: 16 mL/min — ABNORMAL LOW (ref >60)
GFR, EST NON AFRICAN AMERICAN: 13 mL/min — AB (ref >60)
GLUCOSE: 98 mg/dL (ref 65–99)
POTASSIUM: 3.8 mmol/L (ref 3.5–5.1)
Sodium: 144 mmol/L (ref 135–145)

## 2014-10-23 LAB — TROPONIN I

## 2014-10-23 LAB — HEPATIC FUNCTION PANEL
ALT: 19 U/L (ref 14–54)
AST: 22 U/L (ref 15–41)
Albumin: 3.1 g/dL — ABNORMAL LOW (ref 3.5–5.0)
Alkaline Phosphatase: 180 U/L — ABNORMAL HIGH (ref 38–126)
BILIRUBIN TOTAL: 0.9 mg/dL (ref 0.3–1.2)
Bilirubin, Direct: 0.3 mg/dL (ref 0.1–0.5)
Indirect Bilirubin: 0.6 mg/dL (ref 0.3–0.9)
Total Protein: 6.4 g/dL — ABNORMAL LOW (ref 6.5–8.1)

## 2014-10-23 LAB — CREATININE, SERUM
CREATININE: 3.29 mg/dL — AB (ref 0.44–1.00)
GFR calc non Af Amer: 15 mL/min — ABNORMAL LOW (ref >60)
GFR, EST AFRICAN AMERICAN: 17 mL/min — AB (ref >60)

## 2014-10-23 LAB — CBG MONITORING, ED
Glucose-Capillary: >600 mg/dL (ref 65–99)
Glucose-Capillary: >600 mg/dL (ref 65–99)
Glucose-Capillary: >600 mg/dL (ref 65–99)

## 2014-10-23 LAB — AMMONIA: AMMONIA: 18 umol/L (ref 9–35)

## 2014-10-23 LAB — LIPASE, BLOOD: Lipase: 34 U/L (ref 22–51)

## 2014-10-23 LAB — MRSA PCR SCREENING: MRSA by PCR: NEGATIVE

## 2014-10-23 LAB — TSH: TSH: 0.316 u[IU]/mL — ABNORMAL LOW (ref 0.350–4.500)

## 2014-10-23 MED ORDER — LABETALOL HCL 5 MG/ML IV SOLN
10.0000 mg | INTRAVENOUS | Status: DC | PRN
  Administered 2014-10-23 – 2014-10-25 (×7): 10 mg via INTRAVENOUS
  Filled 2014-10-23 (×8): qty 4

## 2014-10-23 MED ORDER — ONDANSETRON HCL 4 MG PO TABS: 4.0000 mg | ORAL_TABLET | Freq: Four times a day (QID) | ORAL | Status: DC | PRN

## 2014-10-23 MED ORDER — ASPIRIN EC 81 MG PO TBEC
81.0000 mg | DELAYED_RELEASE_TABLET | Freq: Every day | ORAL | Status: DC
  Administered 2014-10-25 – 2014-10-26 (×2): 81 mg via ORAL
  Filled 2014-10-23 (×4): qty 1

## 2014-10-23 MED ORDER — INSULIN REGULAR BOLUS VIA INFUSION
0.0000 [IU] | Freq: Three times a day (TID) | INTRAVENOUS | Status: DC
  Filled 2014-10-23: qty 10

## 2014-10-23 MED ORDER — MORPHINE SULFATE 2 MG/ML IJ SOLN
1.0000 mg | INTRAMUSCULAR | Status: DC | PRN
  Administered 2014-10-23: 1 mg via INTRAVENOUS
  Filled 2014-10-23: qty 1

## 2014-10-23 MED ORDER — INSULIN GLARGINE 100 UNIT/ML ~~LOC~~ SOLN
12.0000 [IU] | Freq: Every day | SUBCUTANEOUS | Status: DC
  Administered 2014-10-23: 12 [IU] via SUBCUTANEOUS
  Filled 2014-10-23 (×2): qty 0.12

## 2014-10-23 MED ORDER — HYDRALAZINE HCL 20 MG/ML IJ SOLN
10.0000 mg | Freq: Once | INTRAMUSCULAR | Status: AC
  Administered 2014-10-23: 10 mg via INTRAVENOUS
  Filled 2014-10-23: qty 1

## 2014-10-23 MED ORDER — DARBEPOETIN ALFA 25 MCG/0.42ML IJ SOSY: 25.0000 ug | PREFILLED_SYRINGE | Freq: Once | INTRAMUSCULAR | Status: DC

## 2014-10-23 MED ORDER — HEPARIN SODIUM (PORCINE) 1000 UNIT/ML DIALYSIS: 20.0000 [IU]/kg | INTRAMUSCULAR | Status: DC | PRN

## 2014-10-23 MED ORDER — SEVELAMER CARBONATE 800 MG PO TABS
800.0000 mg | ORAL_TABLET | Freq: Three times a day (TID) | ORAL | Status: DC
Start: 2014-10-23 — End: 2014-10-26
  Administered 2014-10-24 – 2014-10-26 (×3): 800 mg via ORAL
  Filled 2014-10-23 (×11): qty 1

## 2014-10-23 MED ORDER — PENTAFLUOROPROP-TETRAFLUOROETH EX AERO: 1.0000 "application " | INHALATION_SPRAY | CUTANEOUS | Status: DC | PRN

## 2014-10-23 MED ORDER — SODIUM CHLORIDE 0.9 % IV SOLN
INTRAVENOUS | Status: DC
  Administered 2014-10-23: 10.4 [IU]/h via INTRAVENOUS
  Administered 2014-10-23: 5.2 [IU]/h via INTRAVENOUS
  Administered 2014-10-23: 16.2 [IU]/h via INTRAVENOUS
  Filled 2014-10-23: qty 2.5

## 2014-10-23 MED ORDER — LEVETIRACETAM 500 MG PO TABS: 500.0000 mg | ORAL_TABLET | Freq: Every day | ORAL | Status: DC

## 2014-10-23 MED ORDER — INSULIN ASPART 100 UNIT/ML ~~LOC~~ SOLN
0.0000 [IU] | Freq: Three times a day (TID) | SUBCUTANEOUS | Status: DC
  Administered 2014-10-24: 2 [IU] via SUBCUTANEOUS
  Administered 2014-10-24: 9 [IU] via SUBCUTANEOUS
  Administered 2014-10-24: 3 [IU] via SUBCUTANEOUS
  Administered 2014-10-25: 7 [IU] via SUBCUTANEOUS
  Administered 2014-10-25: 5 [IU] via SUBCUTANEOUS
  Administered 2014-10-25: 3 [IU] via SUBCUTANEOUS

## 2014-10-23 MED ORDER — LIDOCAINE HCL (PF) 1 % IJ SOLN: 5.0000 mL | INTRAMUSCULAR | Status: DC | PRN

## 2014-10-23 MED ORDER — DEXTROSE 5 % IV SOLN
INTRAVENOUS | Status: DC
  Administered 2014-10-23: 14:00:00 via INTRAVENOUS

## 2014-10-23 MED ORDER — CALCITRIOL 0.25 MCG PO CAPS
0.2500 ug | ORAL_CAPSULE | Freq: Every day | ORAL | Status: DC
  Administered 2014-10-25 – 2014-10-26 (×2): 0.25 ug via ORAL
  Filled 2014-10-23 (×4): qty 1

## 2014-10-23 MED ORDER — RENA-VITE PO TABS
1.0000 | ORAL_TABLET | Freq: Every day | ORAL | Status: DC
  Administered 2014-10-24 – 2014-10-25 (×2): 1 via ORAL
  Filled 2014-10-23 (×4): qty 1

## 2014-10-23 MED ORDER — HEPARIN SODIUM (PORCINE) 1000 UNIT/ML DIALYSIS: 1000.0000 [IU] | INTRAMUSCULAR | Status: DC | PRN

## 2014-10-23 MED ORDER — CALCITRIOL 0.25 MCG PO CAPS: 1.2500 ug | ORAL_CAPSULE | ORAL | Status: DC

## 2014-10-23 MED ORDER — SODIUM CHLORIDE 0.9 % IV SOLN: 100.0000 mL | INTRAVENOUS | Status: DC | PRN

## 2014-10-23 MED ORDER — LORAZEPAM 2 MG/ML IJ SOLN
0.5000 mg | Freq: Once | INTRAMUSCULAR | Status: AC
  Administered 2014-10-24: 0.5 mg via INTRAVENOUS
  Filled 2014-10-23: qty 1

## 2014-10-23 MED ORDER — SODIUM CHLORIDE 0.9 % IV SOLN
500.0000 mg | Freq: Every day | INTRAVENOUS | Status: DC
  Administered 2014-10-23: 500 mg via INTRAVENOUS
  Filled 2014-10-23: qty 5

## 2014-10-23 MED ORDER — MORPHINE SULFATE 2 MG/ML IJ SOLN
2.0000 mg | Freq: Once | INTRAMUSCULAR | Status: AC
  Administered 2014-10-24: 2 mg via INTRAVENOUS
  Filled 2014-10-23: qty 1

## 2014-10-23 MED ORDER — CALCIUM ACETATE (PHOS BINDER) 667 MG PO CAPS
667.0000 mg | ORAL_CAPSULE | Freq: Three times a day (TID) | ORAL | Status: DC
  Administered 2014-10-24 – 2014-10-26 (×3): 667 mg via ORAL
  Filled 2014-10-23 (×11): qty 1

## 2014-10-23 MED ORDER — SODIUM CHLORIDE 0.9 % IJ SOLN
3.0000 mL | Freq: Two times a day (BID) | INTRAMUSCULAR | Status: DC
  Administered 2014-10-23 – 2014-10-25 (×4): 3 mL via INTRAVENOUS

## 2014-10-23 MED ORDER — CARVEDILOL 25 MG PO TABS
25.0000 mg | ORAL_TABLET | Freq: Two times a day (BID) | ORAL | Status: DC
  Administered 2014-10-23 – 2014-10-25 (×4): 25 mg via ORAL
  Filled 2014-10-23 (×8): qty 1

## 2014-10-23 MED ORDER — MORPHINE SULFATE 4 MG/ML IJ SOLN
4.0000 mg | Freq: Once | INTRAMUSCULAR | Status: AC
  Administered 2014-10-23: 4 mg via INTRAVENOUS
  Filled 2014-10-23: qty 1

## 2014-10-23 MED ORDER — INSULIN ASPART 100 UNIT/ML IV SOLN
10.0000 [IU] | Freq: Once | INTRAVENOUS | Status: AC
  Administered 2014-10-23: 10 [IU] via INTRAVENOUS
  Filled 2014-10-23: qty 1

## 2014-10-23 MED ORDER — HEPARIN SODIUM (PORCINE) 1000 UNIT/ML DIALYSIS: 1800.0000 [IU] | Freq: Once | INTRAMUSCULAR | Status: DC

## 2014-10-23 MED ORDER — ACETAMINOPHEN 650 MG RE SUPP: 650.0000 mg | Freq: Four times a day (QID) | RECTAL | Status: DC | PRN

## 2014-10-23 MED ORDER — HEPARIN SODIUM (PORCINE) 5000 UNIT/ML IJ SOLN
5000.0000 [IU] | Freq: Three times a day (TID) | INTRAMUSCULAR | Status: DC
  Administered 2014-10-23 – 2014-10-26 (×9): 5000 [IU] via SUBCUTANEOUS
  Filled 2014-10-23 (×12): qty 1

## 2014-10-23 MED ORDER — ALBUTEROL SULFATE (2.5 MG/3ML) 0.083% IN NEBU: 3.0000 mL | INHALATION_SOLUTION | Freq: Four times a day (QID) | RESPIRATORY_TRACT | Status: DC | PRN

## 2014-10-23 MED ORDER — ALTEPLASE 2 MG IJ SOLR
2.0000 mg | Freq: Once | INTRAMUSCULAR | Status: DC | PRN
  Filled 2014-10-23: qty 2

## 2014-10-23 MED ORDER — NEPRO/CARBSTEADY PO LIQD
237.0000 mL | ORAL | Status: DC | PRN
  Filled 2014-10-23: qty 237

## 2014-10-23 MED ORDER — NITROGLYCERIN 0.4 MG SL SUBL
0.4000 mg | SUBLINGUAL_TABLET | SUBLINGUAL | Status: DC | PRN
  Filled 2014-10-23: qty 1

## 2014-10-23 MED ORDER — DEXTROSE 50 % IV SOLN
25.0000 mL | INTRAVENOUS | Status: DC | PRN
  Administered 2014-10-23: 12 mL via INTRAVENOUS
  Filled 2014-10-23: qty 50

## 2014-10-23 MED ORDER — SODIUM CHLORIDE 0.9 % IV SOLN
INTRAVENOUS | Status: DC
Start: 2014-10-23 — End: 2014-10-23
  Administered 2014-10-23: 05:00:00 via INTRAVENOUS

## 2014-10-23 MED ORDER — LIDOCAINE-PRILOCAINE 2.5-2.5 % EX CREA
1.0000 "application " | TOPICAL_CREAM | CUTANEOUS | Status: DC | PRN
  Filled 2014-10-23: qty 5

## 2014-10-23 MED ORDER — LEVETIRACETAM 500 MG PO TABS
500.0000 mg | ORAL_TABLET | Freq: Every day | ORAL | Status: DC
  Filled 2014-10-23: qty 1

## 2014-10-23 MED ORDER — PANTOPRAZOLE SODIUM 40 MG PO TBEC
40.0000 mg | DELAYED_RELEASE_TABLET | Freq: Two times a day (BID) | ORAL | Status: DC
Start: 2014-10-23 — End: 2014-10-26
  Administered 2014-10-24 – 2014-10-25 (×3): 40 mg via ORAL
  Filled 2014-10-23 (×3): qty 1

## 2014-10-23 MED ORDER — ONDANSETRON HCL 4 MG/2ML IJ SOLN: 4.0000 mg | Freq: Four times a day (QID) | INTRAMUSCULAR | Status: DC | PRN

## 2014-10-23 MED ORDER — ACETAMINOPHEN 325 MG PO TABS: 650.0000 mg | ORAL_TABLET | Freq: Four times a day (QID) | ORAL | Status: DC | PRN

## 2014-10-23 NOTE — Progress Notes (Signed)
A person called claiming to be patient daughter over phone asking for information about  patient test results.Explained to person on phone that this nurse could not give out patient information over telephone with out password/codeword being established.That it would be [place] violation. Person on phone became upset stating they had never heard of this and that they were on their way to hospital to speak to charge nurse.Physiological scientist informed .

## 2014-10-23 NOTE — H&P (Signed)
[facility] History and Physical  Stephannie Burtis ZOX:[id number] DOB: August 21, 1955 DOA: 10/22/2014  Referring physician: Dr.Oni. PCP: Pamelia Hoit, MD  Specialists: Nephrologist.  Chief Complaint: Altered mental status.  HPI: Madeline [name] is a 59 y.o. female with history of ESRD on hemodialysis on Monday Wednesday Friday, hypertension, COPD, diabetes mellitus type 2 who was recently admitted last week for possible pancreatitis and at that time patient had required vasopressors was brought to the ER by patient's daughter outpatient was found to be increasingly confused. As per patient daughter patient was not doing well since yesterday morning and had at least one episode of nausea and vomiting at home and had not taken her medication as she was not feeling well and missed her dialysis. In the ER patient had another episode of nausea and vomiting and had complained of epigastric pain. Patient was appearing confused and blood sugars were found to be elevated more than 800 but not in DKA. CT of the head was negative for anything acute. Patient is afebrile and chest x-ray does not show any infiltrates. On my exam patient still appears confused blood sugar elevated abdomen appears benign on exam and will be admitted for further management.   Review of Systems: As presented in the history of presenting illness, rest negative.  Past Medical History  Diagnosis Date  . CHF (congestive heart failure)     a. HFpEF with RHF/anasarca/pHTN.  . COPD (chronic obstructive pulmonary disease)   . Coronary artery disease     a. Per [place] records: NSTEMI 01/2012, tx medically given ARF but suspected CAD. b. Stress test 12/16/11 reported w/o ischemia.  . Hypertension   . Diabetes mellitus   . OSA (obstructive sleep apnea)     a. Pt reported used to use CPAP in [place], but "ran out" when came to [facility].  Marland Kitchen CKD (chronic kidney disease), stage III     a. Per [place] records: h/o ARF after CTA that ruled out PE.  Marland Kitchen Pulmonary  hypertension     a. RHC 02/28/13: mod pulm HTN with normal PVR suggestive of predominantly pulmonary venous HTN.  . Right heart failure   . Anasarca     a. Per [place] records - due to pulm HTN with R HF.   Marland Kitchen Aseptic meningitis     a. 09/2012: adm in [place] for metabolic encephalopathy, oliguric tubular necrosis, anemia, HTN, possible CVA, HHNKA.  Marland Kitchen CVA (cerebral infarction)     a. Per [place] records, "possible CVA" 09/2012 but MRI reportedly negative.  . Abnormal Doppler ultrasound of carotid artery     a. Per [place] records: <50% LICA.  Marland Kitchen Renal insufficiency   . Stroke    Past Surgical History  Procedure Laterality Date  . Tubal ligation    . Abdominal hysterectomy    . Esophagogastroduodenoscopy N/A 02/16/2013    Procedure: ESOPHAGOGASTRODUODENOSCOPY (EGD);  Surgeon: Graylin Shiver, MD;  Location: [facility] ENDOSCOPY;  Service: Endoscopy;  Laterality: N/A;  . Insertion of dialysis catheter Right 03/03/2013    Procedure: INSERTION OF DIALYSIS CATHETER;  Surgeon: Larina Earthly, MD;  Location: [facility] OR;  Service: Vascular;  Laterality: Right;  Right Internal Jugular Placement  . Av fistula placement Left 03/03/2013    Procedure: ARTERIOVENOUS (AV) FISTULA CREATION, Brachial/Cephalic;  Surgeon: Larina Earthly, MD;  Location: [facility] OR;  Service: Vascular;  Laterality: Left;  . Right heart catheterization N/A 02/28/2013    Procedure: RIGHT HEART CATH;  Surgeon: Dolores Patty, MD;  Location: [facility] CATH LAB;  Service: Cardiovascular;  Laterality: N/A;  . Shuntogram N/A 07/13/2013    Procedure: FISTULOGRAM;  Surgeon: Fransisco Hertz, MD;  Location: [facility] CATH LAB;  Service: Cardiovascular;  Laterality: N/A;   Social History:  reports that she has quit smoking. Her smokeless tobacco use includes Snuff and Chew. She reports that she does not drink alcohol or use illicit drugs. Where does patient live home. Can patient participate in ADLs? Not sure.  No Known Allergies  Family History:  Family History  Problem Relation Age of  Onset  . Diabetes Mellitus II Sister   . Diabetes Mellitus II Brother   . CAD Brother       Prior to Admission medications   Medication Sig Start Date End Date Taking? Authorizing Provider  acetaminophen (TYLENOL) 500 MG tablet Take 500-1,000 mg by mouth every 6 (six) hours as needed for moderate pain.   Yes Historical Provider, MD  albuterol (PROVENTIL HFA;VENTOLIN HFA) 108 (90 BASE) MCG/ACT inhaler Inhale 2 puffs into the lungs every 6 (six) hours as needed for wheezing or shortness of breath.    Yes Historical Provider, MD  amLODipine (NORVASC) 10 MG tablet Take 10 mg by mouth daily.    Yes Historical Provider, MD  aspirin EC 81 MG tablet Take 81 mg by mouth daily.   Yes Historical Provider, MD  calcitRIOL (ROCALTROL) 0.25 MCG capsule Take 0.25 mcg by mouth daily.   Yes Historical Provider, MD  calcium acetate (PHOSLO) 667 MG capsule Take 667 mg by mouth 3 (three) times daily with meals.   Yes Historical Provider, MD  carvedilol (COREG) 25 MG tablet Take 25 mg by mouth 2 (two) times daily with a meal.   Yes Historical Provider, MD  ferrous sulfate 325 (65 FE) MG tablet Take 325 mg by mouth every Monday, Wednesday, and Friday with hemodialysis.    Yes Historical Provider, MD  hydrALAZINE (APRESOLINE) 100 MG tablet Take 50 mg by mouth 3 (three) times daily.   Yes Historical Provider, MD  insulin glargine (LANTUS) 100 UNIT/ML injection Inject 0.12 mLs (12 Units total) into the skin daily. 10/16/14  Yes Zannie Cove, MD  insulin lispro (HUMALOG) 100 UNIT/ML injection Inject 3-10 Units into the skin 3 (three) times daily before meals. Sliding scale.  CBG 200: 3 UNITS, 250 5 UNITS, 300 7 UNITS, 350 9-10 UNITS   Yes Historical Provider, MD  levETIRAcetam (KEPPRA) 500 MG tablet Take 1 tablet (500 mg total) by mouth daily. 10/16/14  Yes Zannie Cove, MD  nitroGLYCERIN (NITROSTAT) 0.4 MG SL tablet Place 1 tablet (0.4 mg total) under the tongue every 5 (five) minutes as needed for chest pain. 07/05/14   Yes Dolores Patty, MD  oxyCODONE (OXY IR/ROXICODONE) 5 MG immediate release tablet Take 1 tablet (5 mg total) by mouth every 6 (six) hours as needed for moderate pain or severe pain. 10/16/14  Yes Zannie Cove, MD  pantoprazole (PROTONIX) 40 MG tablet Take 1 tablet (40 mg total) by mouth 2 (two) times daily. 06/05/13  Yes Coolidge Breeze, MD  polyethylene glycol (MIRALAX / GLYCOLAX) packet Take 17 g by mouth daily. 10/16/14  Yes Zannie Cove, MD  promethazine (PHENERGAN) 25 MG tablet Take 25 mg by mouth 2 (two) times daily as needed for nausea or vomiting.    Yes Historical Provider, MD  sevelamer carbonate (RENVELA) 800 MG tablet Take 800 mg by mouth 3 (three) times daily with meals.   Yes Historical Provider, MD  traMADol (ULTRAM) 50 MG tablet Take 1  tablet (50 mg total) by mouth every 12 (twelve) hours as needed for moderate pain. 10/16/14  Yes Zannie Cove, MD    Physical Exam: Filed Vitals:   10/23/14 0226 10/23/14 0230 10/23/14 0330 10/23/14 0400  BP: 184/82 190/78 197/84 187/80  Pulse: 81 82 79 81  Temp:      TempSrc:      Resp: 20 15 16 16   SpO2: 100% 100% 100% 98%     General:  Moderately built and poorly built.  Eyes: Anicteric no pallor.  ENT: No discharge from the ears eyes nose and mouth.  Neck: No mass felt. No neck rigidity.  Cardiovascular: S1-S2 heard.  Respiratory: No rhonchi or crepitations.  Abdomen: Soft nontender bowel sounds present.  Skin: No rash.  Musculoskeletal: No edema.  Psychiatric: Patient appears confused.  Neurologic: Patient appears confused. Moves all extremities.  Labs on Admission:  Basic Metabolic Panel:  Recent Labs Lab 10/16/14 0800 10/22/14 2336  NA 135 131*  K 3.4* 4.1  CL 97* 91*  CO2 27 20*  GLUCOSE 229* 819*  BUN 12 19  CREATININE 3.00* 3.34*  CALCIUM 8.2* 9.1   Liver Function Tests:  Recent Labs Lab 10/16/14 0800 10/22/14 2336  AST 57* 32  ALT 21 21  ALKPHOS 158* 199*  BILITOT 0.9 1.3*  PROT  5.3* 6.6  ALBUMIN 2.3* 3.1*    Recent Labs Lab 10/16/14 0800  LIPASE 127*   No results for input(s): AMMONIA in the last 168 hours. CBC:  Recent Labs Lab 10/22/14 2336  WBC 7.9  NEUTROABS 6.4  HGB 12.2  HCT 36.4  MCV 94.8  PLT 260   Cardiac Enzymes: No results for input(s): CKTOTAL, CKMB, CKMBINDEX, TROPONINI in the last 168 hours.  BNP (last 3 results) No results for input(s): BNP in the last 8760 hours.  ProBNP (last 3 results) No results for input(s): PROBNP in the last 8760 hours.  CBG:  Recent Labs Lab 10/16/14 0748 10/22/14 2334 10/23/14 0128 10/23/14 0320  GLUCAP 195* >600* >600* >600*    Radiological Exams on Admission: Dg Chest 2 View  10/23/2014   CLINICAL DATA:  Weakness.  COPD.  CHF.  EXAM: CHEST  2 VIEW  COMPARISON:  10/11/2014  FINDINGS: Normal heart size and pulmonary vascularity. No focal airspace disease or consolidation in the lungs. No blunting of costophrenic angles. No pneumothorax. Mediastinal contours appear intact. Calcification of the aorta. Previous resection or resorption of the distal left clavicle.  IMPRESSION: No active cardiopulmonary disease.   Electronically Signed   By: Burman Nieves M.D.   On: 10/23/2014 01:38   Ct Head Wo Contrast  10/23/2014   CLINICAL DATA:  Acute onset of generalized weakness and confusion. Initial encounter.  EXAM: CT HEAD WITHOUT CONTRAST  TECHNIQUE: Contiguous axial images were obtained from the base of the skull through the vertex without intravenous contrast.  COMPARISON:  CT of the head performed 02/06/2014  FINDINGS: There is no evidence of acute infarction, mass lesion, or intra- or extra-axial hemorrhage on CT.  Scattered periventricular white matter change likely reflects small vessel ischemic microangiopathy.  The posterior fossa, including the cerebellum, brainstem and fourth ventricle, is within normal limits. The third and lateral ventricles, and basal ganglia are unremarkable in appearance. The  cerebral hemispheres are symmetric in appearance, with normal gray-white differentiation. No mass effect or midline shift is seen.  There is no evidence of fracture; visualized osseous structures are unremarkable in appearance. The visualized portions of the orbits are within normal  limits. The paranasal sinuses and mastoid air cells are well-aerated. No significant soft tissue abnormalities are seen.  IMPRESSION: 1. No acute intracranial pathology seen on CT. 2. Mild small vessel ischemic microangiopathy.   Electronically Signed   By: Roanna Raider M.D.   On: 10/23/2014 03:04    EKG: Independently reviewed. Normal sinus rhythm with QT interval of 531 ms. Nonspecific T wave changes.  Assessment/Plan Principal Problem:   Acute encephalopathy Active Problems:   ESRD on hemodialysis   Type 2 diabetes mellitus with hyperosmolar nonketotic hyperglycemia   Nausea & vomiting   Hypertension   1. Acute encephalopathy - cause not clear. May be secondary to uncontrolled diabetes mellitus. Check MRI brain ammonia levels. 2. Hyperosmolar nonketotic diabetes mellitus type 2 - patient's recent hemoglobin A1c was 15. At this time I have placed patient on IV insulin glucose stabilizer. Closely follow CBGs. 3. Nausea vomiting with abdominal pain - patient during her recent stay was found to have acute pancreatitis. I have ordered lipase and repeat LFTs and also ordered KUB and sonogram of the abdomen. For now I'm keeping patient nothing by mouth. 4. Hypertension - since patient is nothing by mouth Patient on when necessary IV labetalol for now. Closely follow blood pressure trends. 5. ESRD on hemodialysis on Monday Wednesday and Friday - consult nephrology for dialysis as patient had missed her dialysis yesterday. 6. History of COPD and pulmonary hypertension and right heart failure - presently not wheezing. Fluid management per nephrology. 7. History of anemia - follow CBC.   DVT Prophylaxis heparin.  Code  Status: Full code.  Family Communication: Patient's daughter.  Disposition Plan: Admit to inpatient.    Jack Bolio N. [facility] Pager [phone].  If 7PM-7AM, please contact night-coverage [email or web address] Password [email or web address] 10/23/2014, 4:29 AM

## 2014-10-23 NOTE — ED Notes (Signed)
RN attempted IV start; 2nd RN to start IV  

## 2014-10-23 NOTE — ED Notes (Signed)
Critical CGB 821 received by RN; Md notified

## 2014-10-23 NOTE — Progress Notes (Signed)
IV team in to stick patient for second line for medication.IV nurse stuck patient one time unsuccessful patient refused to have another stick or to left another member of IV team to come to assess for access.

## 2014-10-23 NOTE — Progress Notes (Signed)
UR COMPLETED  

## 2014-10-23 NOTE — Progress Notes (Signed)
Patient admitted after midnight. Chart reviewed. Patient examined.  CBGs better. [place] with chronic CBD dilitation with unchanged focal pancreatic head fullness. LFTs and lipase ok.  MRI brain without acute infarct.  Initial BMET showed AG 20 with CO2 20, so likely WAS IN DKA. Will repeat stat. Start diet. Continue SDU for today. Nephrology consulted.   Crista Curb, MD [facility]

## 2014-10-23 NOTE — Consult Note (Signed)
Indication for Consultation:  Management of ESRD/hemodialysis; anemia, hypertension/volume and secondary hyperparathyroidism  HPI: Madeline [name] is a 59 y.o. female who was brought to the ED last night by her daughter for evaluation of acute onset confusion and hyperglycemia. She receives HD MWF, last HD 6/10, history of HTN, DM and CHF. She was recently admitted 6/1-6/7 for encephalopathy secondary to [facility] and pancreatitis/abd pain. Her daughter saw her on Saturday 6/11 and thought pt was looking better but she went over last night and pt was confused and when she checked glucose meter was reading high, she gave pt 10u of lispro x2 and continued to run high, she also had a few episodes of emesis so her daughter brought her to be evaluated. Will arrange HD while admitted.  Past Medical History  Diagnosis Date  . CHF (congestive heart failure)     a. HFpEF with RHF/anasarca/pHTN.  . COPD (chronic obstructive pulmonary disease)   . Coronary artery disease     a. Per [place] records: NSTEMI 01/2012, tx medically given ARF but suspected CAD. b. Stress test 12/16/11 reported w/o ischemia.  . Hypertension   . Diabetes mellitus   . OSA (obstructive sleep apnea)     a. Pt reported used to use CPAP in [place], but "ran out" when came to [facility].  Marland Kitchen CKD (chronic kidney disease), stage III     a. Per [place] records: h/o ARF after CTA that ruled out PE.  Marland Kitchen Pulmonary hypertension     a. RHC 02/28/13: mod pulm HTN with normal PVR suggestive of predominantly pulmonary venous HTN.  . Right heart failure   . Anasarca     a. Per [place] records - due to pulm HTN with R HF.   Marland Kitchen Aseptic meningitis     a. 09/2012: adm in [place] for metabolic encephalopathy, oliguric tubular necrosis, anemia, HTN, possible CVA, HHNKA.  Marland Kitchen CVA (cerebral infarction)     a. Per [place] records, "possible CVA" 09/2012 but MRI reportedly negative.  . Abnormal Doppler ultrasound of carotid artery     a. Per [place] records: <50% LICA.  Marland Kitchen Renal insufficiency   . Stroke     Past Surgical History  Procedure Laterality Date  . Tubal ligation    . Abdominal hysterectomy    . Esophagogastroduodenoscopy N/A 02/16/2013    Procedure: ESOPHAGOGASTRODUODENOSCOPY (EGD);  Surgeon: Graylin Shiver, MD;  Location: [facility] ENDOSCOPY;  Service: Endoscopy;  Laterality: N/A;  . Insertion of dialysis catheter Right 03/03/2013    Procedure: INSERTION OF DIALYSIS CATHETER;  Surgeon: Larina Earthly, MD;  Location: [facility] OR;  Service: Vascular;  Laterality: Right;  Right Internal Jugular Placement  . Av fistula placement Left 03/03/2013    Procedure: ARTERIOVENOUS (AV) FISTULA CREATION, Brachial/Cephalic;  Surgeon: Larina Earthly, MD;  Location: [facility] OR;  Service: Vascular;  Laterality: Left;  . Right heart catheterization N/A 02/28/2013    Procedure: RIGHT HEART CATH;  Surgeon: Dolores Patty, MD;  Location: [facility] CATH LAB;  Service: Cardiovascular;  Laterality: N/A;  . Shuntogram N/A 07/13/2013    Procedure: FISTULOGRAM;  Surgeon: Fransisco Hertz, MD;  Location: [facility] CATH LAB;  Service: Cardiovascular;  Laterality: N/A;   Family History  Problem Relation Age of Onset  . Diabetes Mellitus II Sister   . Diabetes Mellitus II Brother   . CAD Brother    Social History:  reports that she has quit smoking. Her smokeless tobacco use includes Snuff and Chew. She reports that she does not drink alcohol  or use illicit drugs. No Known Allergies Prior to Admission medications   Medication Sig Start Date End Date Taking? Authorizing Provider  acetaminophen (TYLENOL) 500 MG tablet Take 500-1,000 mg by mouth every 6 (six) hours as needed for moderate pain.   Yes Historical Provider, MD  albuterol (PROVENTIL HFA;VENTOLIN HFA) 108 (90 BASE) MCG/ACT inhaler Inhale 2 puffs into the lungs every 6 (six) hours as needed for wheezing or shortness of breath.    Yes Historical Provider, MD  amLODipine (NORVASC) 10 MG tablet Take 10 mg by mouth daily.    Yes Historical Provider, MD  aspirin EC 81 MG tablet Take 81 mg by  mouth daily.   Yes Historical Provider, MD  calcitRIOL (ROCALTROL) 0.25 MCG capsule Take 0.25 mcg by mouth daily.   Yes Historical Provider, MD  calcium acetate (PHOSLO) 667 MG capsule Take 667 mg by mouth 3 (three) times daily with meals.   Yes Historical Provider, MD  carvedilol (COREG) 25 MG tablet Take 25 mg by mouth 2 (two) times daily with a meal.   Yes Historical Provider, MD  ferrous sulfate 325 (65 FE) MG tablet Take 325 mg by mouth every Monday, Wednesday, and Friday with hemodialysis.    Yes Historical Provider, MD  hydrALAZINE (APRESOLINE) 100 MG tablet Take 50 mg by mouth 3 (three) times daily.   Yes Historical Provider, MD  insulin glargine (LANTUS) 100 UNIT/ML injection Inject 0.12 mLs (12 Units total) into the skin daily. 10/16/14  Yes Zannie Cove, MD  insulin lispro (HUMALOG) 100 UNIT/ML injection Inject 3-10 Units into the skin 3 (three) times daily before meals. Sliding scale.  CBG 200: 3 UNITS, 250 5 UNITS, 300 7 UNITS, 350 9-10 UNITS   Yes Historical Provider, MD  levETIRAcetam (KEPPRA) 500 MG tablet Take 1 tablet (500 mg total) by mouth daily. 10/16/14  Yes Zannie Cove, MD  nitroGLYCERIN (NITROSTAT) 0.4 MG SL tablet Place 1 tablet (0.4 mg total) under the tongue every 5 (five) minutes as needed for chest pain. 07/05/14  Yes Dolores Patty, MD  oxyCODONE (OXY IR/ROXICODONE) 5 MG immediate release tablet Take 1 tablet (5 mg total) by mouth every 6 (six) hours as needed for moderate pain or severe pain. 10/16/14  Yes Zannie Cove, MD  pantoprazole (PROTONIX) 40 MG tablet Take 1 tablet (40 mg total) by mouth 2 (two) times daily. 06/05/13  Yes Coolidge Breeze, MD  polyethylene glycol (MIRALAX / GLYCOLAX) packet Take 17 g by mouth daily. 10/16/14  Yes Zannie Cove, MD  promethazine (PHENERGAN) 25 MG tablet Take 25 mg by mouth 2 (two) times daily as needed for nausea or vomiting.    Yes Historical Provider, MD  sevelamer carbonate (RENVELA) 800 MG tablet Take 800 mg by mouth 3  (three) times daily with meals.   Yes Historical Provider, MD  traMADol (ULTRAM) 50 MG tablet Take 1 tablet (50 mg total) by mouth every 12 (twelve) hours as needed for moderate pain. 10/16/14  Yes Zannie Cove, MD   Current Facility-Administered Medications  Medication Dose Route Frequency Provider Last Rate Last Dose  . 0.9 %  sodium chloride infusion   Intravenous Continuous Eduard Clos, MD 10 mL/hr at 10/23/14 0454    . acetaminophen (TYLENOL) tablet 650 mg  650 mg Oral Q6H PRN Eduard Clos, MD       Or  . acetaminophen (TYLENOL) suppository 650 mg  650 mg Rectal Q6H PRN Eduard Clos, MD      . albuterol (PROVENTIL) (  2.5 MG/3ML) 0.083% nebulizer solution 3 mL  3 mL Inhalation Q6H PRN Eduard Clos, MD      . dextrose 50 % solution 25 mL  25 mL Intravenous PRN Eduard Clos, MD      . heparin injection 5,000 Units  5,000 Units Subcutaneous 3 times per day Eduard Clos, MD   5,000 Units at 10/23/14 0501  . insulin regular (NOVOLIN R,HUMULIN R) 250 Units in sodium chloride 0.9 % 250 mL (1 Units/mL) infusion   Intravenous Continuous Eduard Clos, MD 9.1 mL/hr at 10/23/14 0855 9.1 Units/hr at 10/23/14 0855  . insulin regular bolus via infusion 0-10 Units  0-10 Units Intravenous TID WC Eduard Clos, MD   0 Units at 10/23/14 0800  . labetalol (NORMODYNE,TRANDATE) injection 10 mg  10 mg Intravenous Q2H PRN Eduard Clos, MD   10 mg at 10/23/14 0451  . levETIRAcetam (KEPPRA) 500 mg in sodium chloride 0.9 % 100 mL IVPB  500 mg Intravenous Daily Eduard Clos, MD   500 mg at 10/23/14 0459  . morphine 2 MG/ML injection 1 mg  1 mg Intravenous Q4H PRN Eduard Clos, MD      . nitroGLYCERIN (NITROSTAT) SL tablet 0.4 mg  0.4 mg Sublingual Q5 min PRN Eduard Clos, MD      . ondansetron [facility]) tablet 4 mg  4 mg Oral Q6H PRN Eduard Clos, MD       Or  . ondansetron [facility]) injection 4 mg  4 mg Intravenous Q6H PRN Eduard Clos, MD      . sodium chloride 0.9 % injection 3 mL  3 mL Intravenous Q12H Eduard Clos, MD   3 mL at 10/23/14 1000   Labs: Basic Metabolic Panel:  Recent Labs Lab 10/22/14 2336 10/23/14 0535  NA 131*  --   K 4.1  --   CL 91*  --   CO2 20*  --   GLUCOSE 819*  --   BUN 19  --   CREATININE 3.34* 3.29*  CALCIUM 9.1  --    Liver Function Tests:  Recent Labs Lab 10/22/14 2336 10/23/14 0535  AST 32 22  ALT 21 19  ALKPHOS 199* 180*  BILITOT 1.3* 0.9  PROT 6.6 6.4*  ALBUMIN 3.1* 3.1*    Recent Labs Lab 10/23/14 0535  LIPASE 34    Recent Labs Lab 10/23/14 0535  AMMONIA 18   CBC:  Recent Labs Lab 10/22/14 2336 10/23/14 0535  WBC 7.9 8.1  NEUTROABS 6.4  --   HGB 12.2 11.9*  HCT 36.4 36.1  MCV 94.8 94.5  PLT 260 209   Cardiac Enzymes:  Recent Labs Lab 10/23/14 0535  TROPONINI <0.03   CBG:  Recent Labs Lab 10/23/14 0429 10/23/14 0523 10/23/14 0629 10/23/14 0738 10/23/14 0854  GLUCAP 580* 583* 578* >600* 362*   Iron Studies: No results for input(s): IRON, TIBC, TRANSFERRIN, FERRITIN in the last 72 hours. Studies/Results: Dg Chest 2 View  10/23/2014   CLINICAL DATA:  Weakness.  COPD.  CHF.  EXAM: CHEST  2 VIEW  COMPARISON:  10/11/2014  FINDINGS: Normal heart size and pulmonary vascularity. No focal airspace disease or consolidation in the lungs. No blunting of costophrenic angles. No pneumothorax. Mediastinal contours appear intact. Calcification of the aorta. Previous resection or resorption of the distal left clavicle.  IMPRESSION: No active cardiopulmonary disease.   Electronically Signed   By: Burman Nieves M.D.   On: 10/23/2014 01:38  Dg Abd 1 View  10/23/2014   CLINICAL DATA:  Nausea and vomiting.  EXAM: ABDOMEN - 1 VIEW  COMPARISON:  CT 10/10/2014.  FINDINGS: Soft tissue structures are unremarkable. No bowel distention. No free air. Stool in colon. Stable abdominal pelvic phleboliths. No acute bony abnormality.  IMPRESSION: No  acute abnormality.   Electronically Signed   By: Maisie Fus  Register   On: 10/23/2014 08:04   Ct Head Wo Contrast  10/23/2014   CLINICAL DATA:  Acute onset of generalized weakness and confusion. Initial encounter.  EXAM: CT HEAD WITHOUT CONTRAST  TECHNIQUE: Contiguous axial images were obtained from the base of the skull through the vertex without intravenous contrast.  COMPARISON:  CT of the head performed 02/06/2014  FINDINGS: There is no evidence of acute infarction, mass lesion, or intra- or extra-axial hemorrhage on CT.  Scattered periventricular white matter change likely reflects small vessel ischemic microangiopathy.  The posterior fossa, including the cerebellum, brainstem and fourth ventricle, is within normal limits. The third and lateral ventricles, and basal ganglia are unremarkable in appearance. The cerebral hemispheres are symmetric in appearance, with normal gray-white differentiation. No mass effect or midline shift is seen.  There is no evidence of fracture; visualized osseous structures are unremarkable in appearance. The visualized portions of the orbits are within normal limits. The paranasal sinuses and mastoid air cells are well-aerated. No significant soft tissue abnormalities are seen.  IMPRESSION: 1. No acute intracranial pathology seen on CT. 2. Mild small vessel ischemic microangiopathy.   Electronically Signed   By: Roanna Raider M.D.   On: 10/23/2014 03:04   Review of Systems: No current complaints- does not remember why she was brought to the ED  Physical Exam: Filed Vitals:   10/23/14 0330 10/23/14 0400 10/23/14 0430 10/23/14 0800  BP: 197/84 187/80 182/67   Pulse: 79 81 80   Temp:   98 F (36.7 C) 98.1 F (36.7 C)  TempSrc:   Oral Oral  Resp: 16 16 16    Height:   4\' 9"  (1.448 m)   Weight:   58.5 kg (128 lb 15.5 oz)   SpO2: 100% 98% 98%      General: Well developed, well nourished, in no acute distress. Head: Normocephalic, atraumatic, sclera non-icteric, mucus  membranes are moist Neck: Supple. JVD not elevated. Lungs: Clear bilaterally to auscultation without wheezes, rales, or rhonchi. Breathing is unlabored. Heart: RRR with S1 S2. No murmurs, rubs, or gallops appreciated. Abdomen: Soft, non-tender, non-distended with normoactive bowel sounds. No rebound/guarding. No obvious abdominal masses. M-S:  Strength and tone appear normal for age. Lower extremities:without edema or ischemic changes, no open wounds  Neuro: Oriented to person only. Moves all extremities spontaneously. Psych:  Flat affect. Slow to respond Dialysis Access: L AVF patent on HD  Dialysis Orders:  [facility]  4 hours    60.5kgs    2K/2Ca+     Heparin 1800 u  Profile 4    Aranesp 25 q 2 weeks - last given 6/8 Calcitriol 1.25 1 x week- last given 6/10   Assessment/Plan: 1. Hyperglycemia- >600 on admission- now down to 132. Per primary. Recent a1c 15.5 2. Acute encephalopathy- head CT and MRI no acute findings.  3. N/V- abd xray no acute finding- recent pancreatitis, lipase now 34. US- pancreatic head fullness compatible with similar finding/stable common duct dilatation. Afebrile.  4.  ESRD -  K+ 4.1 HD pending today, missed yesterday 5.  Hypertension/volume  - 182/67 under edw which was just  lowered, small gains. Amlodipine, clonidine and carvedilol- home meds.   6.  Anemia  - hgb 11.9 last ESA given 6/8- due 6/22. Last tsat 52, no FE. Watch CBC 7.  Metabolic bone disease -  ca+ 9.1 renvela and phoslo- hold- last phos 2.7. Last PTH 405, cont weekly calcitriol on friday 8.  Nutrition - alb 3.1 NPO  Jetty Duhamel, NP [facility] [phone] 10/23/2014, 10:21 AM    Pt seen, examined and agree w A/P as above. Recurrent AMS and uncontrolled DM. Some asterixis, not on any meds that would cause this.  Plan HD today.  Vinson Moselle MD pager [phone]    cell [phone] 10/23/2014, 4:16 PM

## 2014-10-23 NOTE — ED Notes (Signed)
Admitting MD at bedside.

## 2014-10-23 NOTE — ED Notes (Signed)
MD at bedside. 

## 2014-10-23 NOTE — Progress Notes (Signed)
Pt returned to unit from HD with insulin gtt off. Cbg 242. Paged Schorr, to clarify if insulin drip can remain off or be restarted. Says insulin gtt can stay off, cover current cbg per SSI and will add new orders for lantus and diet. Communicated this with night RN Thurston Hole, who will continue to monitor.

## 2014-10-23 NOTE — Clinical Social Work Note (Signed)
CSW Consult Acknowledged:   CSW received a consult for home health. CSW informed the case manager. CSW will sign off.    Shin Lamour, MSW, LCSWA [phone]

## 2014-10-23 NOTE — Procedures (Signed)
I was present at this dialysis session, have reviewed the session itself and made  appropriate changes  Vinson Moselle MD (pgr) [phone]    [phone] 10/23/2014, 3:45 PM

## 2014-10-24 DIAGNOSIS — R935 Abnormal findings on diagnostic imaging of other abdominal regions, including retroperitoneum: Secondary | ICD-10-CM | POA: Diagnosis present

## 2014-10-24 LAB — URINALYSIS, ROUTINE W REFLEX MICROSCOPIC
Bilirubin Urine: NEGATIVE
Bilirubin Urine: NEGATIVE
Glucose, UA: >1000 mg/dL — AB
Glucose, UA: >1000 mg/dL — AB
HGB URINE DIPSTICK: NEGATIVE
Hgb urine dipstick: NEGATIVE
Ketones, ur: 15 mg/dL — AB
Ketones, ur: 15 mg/dL — AB
Leukocytes, UA: NEGATIVE
Leukocytes, UA: NEGATIVE
Nitrite: NEGATIVE
Nitrite: NEGATIVE
Protein, ur: >300 mg/dL — AB
Protein, ur: 100 mg/dL — AB
Specific Gravity, Urine: 1.025 (ref 1.005–1.030)
Specific Gravity, Urine: 1.025 (ref 1.005–1.030)
Urobilinogen, UA: 0.2 mg/dL (ref 0.0–1.0)
Urobilinogen, UA: 0.2 mg/dL (ref 0.0–1.0)
pH: 5 (ref 5.0–8.0)
pH: 5 (ref 5.0–8.0)

## 2014-10-24 LAB — BASIC METABOLIC PANEL
Anion gap: 14 (ref 5–15)
BUN: 11 mg/dL (ref 6–20)
CO2: 21 mmol/L — ABNORMAL LOW (ref 22–32)
CREATININE: 2 mg/dL — AB (ref 0.44–1.00)
Calcium: 9 mg/dL (ref 8.9–10.3)
Chloride: 98 mmol/L — ABNORMAL LOW (ref 101–111)
GFR calc Af Amer: 31 mL/min — ABNORMAL LOW (ref >60)
GFR, EST NON AFRICAN AMERICAN: 27 mL/min — AB (ref >60)
GLUCOSE: 415 mg/dL — AB (ref 65–99)
Potassium: 5.4 mmol/L — ABNORMAL HIGH (ref 3.5–5.1)
Sodium: 133 mmol/L — ABNORMAL LOW (ref 135–145)

## 2014-10-24 LAB — VITAMIN B12: Vitamin B-12: 1419 pg/mL — ABNORMAL HIGH (ref 180–914)

## 2014-10-24 LAB — GLUCOSE, CAPILLARY
GLUCOSE-CAPILLARY: 406 mg/dL — AB (ref 65–99)
GLUCOSE-CAPILLARY: 169 mg/dL — AB (ref 65–99)
GLUCOSE-CAPILLARY: 401 mg/dL — AB (ref 65–99)
Glucose-Capillary: 226 mg/dL — ABNORMAL HIGH (ref 65–99)

## 2014-10-24 LAB — URINE MICROSCOPIC-ADD ON

## 2014-10-24 LAB — RAPID URINE DRUG SCREEN, HOSP PERFORMED
Amphetamines: NOT DETECTED
Barbiturates: NOT DETECTED
Benzodiazepines: NOT DETECTED
COCAINE: NOT DETECTED
OPIATES: POSITIVE — AB
Tetrahydrocannabinol: NOT DETECTED

## 2014-10-24 MED ORDER — HYDRALAZINE HCL 20 MG/ML IJ SOLN
5.0000 mg | Freq: Once | INTRAMUSCULAR | Status: AC
  Administered 2014-10-24: 5 mg via INTRAVENOUS
  Filled 2014-10-24: qty 1

## 2014-10-24 MED ORDER — INSULIN GLARGINE 100 UNIT/ML ~~LOC~~ SOLN
18.0000 [IU] | Freq: Every day | SUBCUTANEOUS | Status: DC
  Administered 2014-10-24: 18 [IU] via SUBCUTANEOUS
  Filled 2014-10-24 (×2): qty 0.18

## 2014-10-24 MED ORDER — INSULIN ASPART 100 UNIT/ML ~~LOC~~ SOLN
5.0000 [IU] | Freq: Once | SUBCUTANEOUS | Status: AC
  Administered 2014-10-24: 5 [IU] via SUBCUTANEOUS

## 2014-10-24 NOTE — Clinical Social Work Note (Signed)
CSW Consult Acknowledged:   CSW received a consult for SNF placement. CSW spoke with the pt's daughter Madeline [name]. Madeline [name] declined SNF. Madeline [name] reported that between her self, her siblings and her aunt(who lives at home with the pt) she feels confident that they can provided care for her mother at home. CSW informed the case manager. CSW will sign off.        Ara Mano, MSW, LCSWA [phone]

## 2014-10-24 NOTE — Progress Notes (Signed)
[facility] Progress Note   Subjective: more alert  Filed Vitals:   10/24/14 0431 10/24/14 0500 10/24/14 0700 10/24/14 0731  BP:  129/52 167/65 144/62  Pulse: 67   98  Temp: 98.4 F (36.9 C)   99.1 F (37.3 C)  TempSrc: Axillary   Oral  Resp:  Height:      Weight:      SpO2: 97%   94%   Exam: Alert, no jvd Mild asterixis No jvd Chest clear bilat RRR no MRG aBd soft ntnd no mass or ascites No LE or UE edema L AVF patent Neuro is groggy, arouses easily, nf  MRI/ CT brain negative for CVA B12 1419 NH3 44, 18 TSH 0.316 Vit 25 D  Oct 2014 <10 (30-90) Hb A1C  10/14 - 7.7                 8/15  - 12.3                  10/13/14 - 15.5   Date Glucose (outpt HD) 10/15 658  11/15 196     12/15 587 1/16 389 2022-07-24 235 3/16 218 4/16 784 5/16 763  [facility] 4 hours 60.5kgs 2K/2Ca+ Heparin 1800 u Profile 4  Aranesp 25 q 2 weeks - last given 6/8 Calcitriol 1.25 1 x week- last given 6/10       Assessment: 1. Uncontrolled DM bs >600 - recurrent issue, BS"s at OP HD very high the last few months (we did not realize this as the glucose numbers don't populate into the notes w the new software package). She may not be taking her insulin. Her A1C has doubled in the last 2 years. She was depressed last yr per daughter after her little brother died 2022/07/24. Dont' see acute infection/ etc , as cause for recurrent hyperglycemia. Will d/w primary. 2. AMS is a little better 3. ESRD HD MWF 4. HTN/volume is stable, under dry wt 5. Anemia stable 6. MBD stable  Plan - HD today    Vinson Moselle MD  pager [phone]    cell [phone]  10/24/2014, 12:15 PM     Recent Labs Lab 10/22/14 2336 10/23/14 0535 10/23/14 1344 10/24/14 0249  NA 131*  --  144 133*  K 4.1  --  3.8 5.4*  CL 91*  --  104 98*  CO2 20*  --  30 21*  GLUCOSE 819*  --  98 415*  BUN 19  --  22* 11  CREATININE 3.34* 3.29* 3.50* 2.00*  CALCIUM 9.1  --  9.4 9.0    Recent  Labs Lab 10/22/14 2336 10/23/14 0535  AST 32 22  ALT 21 19  ALKPHOS 199* 180*  BILITOT 1.3* 0.9  PROT 6.6 6.4*  ALBUMIN 3.1* 3.1*    Recent Labs Lab 10/22/14 2336 10/23/14 0535  WBC 7.9 8.1  NEUTROABS 6.4  --   HGB 12.2 11.9*  HCT 36.4 36.1  MCV 94.8 94.5  PLT 260 209   . aspirin EC  81 mg Oral Daily  . calcitRIOL  0.25 mcg Oral Daily  . calcium acetate  667 mg Oral TID WC  . carvedilol  25 mg Oral BID WC  . [START ON 10/31/2014] darbepoetin (ARANESP) injection - DIALYSIS  25 mcg Intravenous Once  . heparin  5,000 Units Subcutaneous 3 times per day  . insulin aspart  0-9 Units Subcutaneous TID WC  . insulin glargine  18  Units Subcutaneous QHS  . multivitamin  1 tablet Oral QHS  . pantoprazole  40 mg Oral BID  . sevelamer carbonate  800 mg Oral TID WC  . sodium chloride  3 mL Intravenous Q12H     acetaminophen **OR** acetaminophen, albuterol, dextrose, labetalol, nitroGLYCERIN, ondansetron **OR** ondansetron (ZOFRAN) IV

## 2014-10-24 NOTE — Progress Notes (Addendum)
Inpatient Diabetes Program Recommendations  AACE/ADA: New Consensus Statement on Inpatient Glycemic Control (2013)  Target Ranges:  Prepandial:   less than 140 mg/dL      Peak postprandial:   less than 180 mg/dL (1-2 hours)      Critically ill patients:  140 - 180 mg/dL    Inpatient Diabetes Program Recommendations  Insulin - IV drip/GlucoStabilizer: Due to patient missing the timing of basal and correction insulin with the discontinuation of insulin gtt yesterday evening, Please consider placing patient back on insulin gtt until gluocse is more controlled.  Insulin - Basal: When it is time to transition off insulin gtt, please consider Lantus 18-20 units Q24 hrs.   Thanks,  Christena Deem RN, MSN, [facility] Inpatient Diabetes Coordinator Team Pager [phone]

## 2014-10-24 NOTE — Progress Notes (Signed)
Pt's cbg 406. Paged Dr Lendell Caprice. Ordered to give 9units per SSI at this time. Will give and continue to monitor.

## 2014-10-24 NOTE — Progress Notes (Signed)
[facility] PROGRESS NOTE  Krithi Millward LTJ:[id number] DOB: Dec 30, 1955 DOA: 10/22/2014 PCP: Pamelia Hoit, MD  Assessment/Plan:  Principal Problem:   Acute encephalopathy:  Worse today, despite resolution of DKA. Blood glucose this morning is 400 which isn't helping. Discussed with patient's daughter at bedside. She reports that even when blood glucose was 1300 on admission last admission, she was alert and talking. Reviewed chart from previous record. Ammonia level normal. TSH normal. Daughter reports that patient was complaining of "feeling tired" all the time after discharge. She was started on Keppra for unexplained asterixis by neurology. This may be the culprit. MRI brain is negative. UA has not been collected as patient has not had any urine. She does still however make urine. I will asked nursing staff to get in in and out cath for UA, stopped the Keppra, check B-12 and RBC folate. Continue step down monitoring. Discussed with Dr. Amada Jupiter who did the initial consult last admission. He agrees. Active Problems:   ESRD on hemodialysis:  Dialyzed yesterday.   DKA, type 2, not at goal:  Was transitioned off the insulin drip and dialysis, but did not get Lantus until after back from dialysis. Got her usual home dose. Will continue sliding scale sensitive for now, and adjust Lantus as well. Patient's daughter reports that it's not unusual for her to run sugars of 200 to 300 at home.   Nausea & vomiting   Hypertension:  Controlled.   Abnormal abdominal ultrasound:  LFTs and lipase normal. Patient denies abdominal pain. Has had no further vomiting. Appetite has been poor, but she's been fairly lethargic. Common duct dilatation appears chronic and no definite cut off point to suggest stone or stricture, but continues to have fullness of the pancreatic head. Will need an MRCP at some point once more stable. Could potentially do inpatient or outpatient.  We will also get a PT OT  evaluation.  Code Status:  full Family Communication:  Daughter at bedside Disposition Plan:    Consultants:  Nephrology  Procedures:     Antibiotics:    HPI/Subjective: Daughter reports patient is much more lethargic than she had been previously. See above for details. Nurse reports that overnight, patient has been very lethargic. His had no urine output so unable to collect a UA.  Objective: Filed Vitals:   10/24/14 0731  BP: 144/62  Pulse: 98  Temp: 99.1 F (37.3 C)  Resp: 15    Intake/Output Summary (Last 24 hours) at 10/24/14 0935 Last data filed at 10/23/14 2222  Gross per 24 hour  Intake      3 ml  Output   1000 ml  Net   -997 ml   Filed Weights   10/23/14 0430 10/23/14 1500 10/23/14 1906  Weight: 58.5 kg (128 lb 15.5 oz) 59.1 kg (130 lb 4.7 oz) 58 kg (127 lb 13.9 oz)    Exam:   General:  Asleep. Arousable. Quickly falls back asleep. Voice quiet. Will answer her name but not say much else.  Cardiovascular: Regular rate rhythm without murmurs gallops rubs  Respiratory: Clear to auscultation bilaterally without wheezes rhonchi or rales  Abdomen: Soft nontender nondistended  Ext: No clubbing cyanosis or edema. Patient is not very cooperative but no asterixis that I can tell.  Basic Metabolic Panel:  Recent Labs Lab 10/22/14 2336 10/23/14 0535 10/23/14 1344 10/24/14 0249  NA 131*  --  144 133*  K 4.1  --  3.8 5.4*  CL 91*  --  104 98*  CO2 20*  --  30 21*  GLUCOSE 819*  --  98 415*  BUN 19  --  22* 11  CREATININE 3.34* 3.29* 3.50* 2.00*  CALCIUM 9.1  --  9.4 9.0   Liver Function Tests:  Recent Labs Lab 10/22/14 2336 10/23/14 0535  AST 32 22  ALT 21 19  ALKPHOS 199* 180*  BILITOT 1.3* 0.9  PROT 6.6 6.4*  ALBUMIN 3.1* 3.1*    Recent Labs Lab 10/23/14 0535  LIPASE 34    Recent Labs Lab 10/23/14 0535  AMMONIA 18   CBC:  Recent Labs Lab 10/22/14 2336 10/23/14 0535  WBC 7.9 8.1  NEUTROABS 6.4  --   HGB 12.2 11.9*   HCT 36.4 36.1  MCV 94.8 94.5  PLT 260 209   Cardiac Enzymes:  Recent Labs Lab 10/23/14 0535  TROPONINI <0.03   BNP (last 3 results) No results for input(s): BNP in the last 8760 hours.  ProBNP (last 3 results) No results for input(s): PROBNP in the last 8760 hours.  CBG:  Recent Labs Lab 10/23/14 1339 10/23/14 1448 10/23/14 1938 10/23/14 2019 10/23/14 2144  GLUCAP 104* 132* 242* 296* 294*    Recent Results (from the past 240 hour(s))  MRSA PCR Screening     Status: None   Collection Time: 10/23/14  4:43 AM  Result Value Ref Range Status   MRSA by PCR NEGATIVE NEGATIVE Final    Comment:        The GeneXpert MRSA Assay (FDA approved for NASAL specimens only), is one component of a comprehensive MRSA colonization surveillance program. It is not intended to diagnose MRSA infection nor to guide or monitor treatment for MRSA infections.      Studies: Dg Chest 2 View  10/23/2014   CLINICAL DATA:  Weakness.  COPD.  CHF.  EXAM: CHEST  2 VIEW  COMPARISON:  10/11/2014  FINDINGS: Normal heart size and pulmonary vascularity. No focal airspace disease or consolidation in the lungs. No blunting of costophrenic angles. No pneumothorax. Mediastinal contours appear intact. Calcification of the aorta. Previous resection or resorption of the distal left clavicle.  IMPRESSION: No active cardiopulmonary disease.   Electronically Signed   By: Burman Nieves M.D.   On: 10/23/2014 01:38   Dg Abd 1 View  10/23/2014   CLINICAL DATA:  Nausea and vomiting.  EXAM: ABDOMEN - 1 VIEW  COMPARISON:  CT 10/10/2014.  FINDINGS: Soft tissue structures are unremarkable. No bowel distention. No free air. Stool in colon. Stable abdominal pelvic phleboliths. No acute bony abnormality.  IMPRESSION: No acute abnormality.   Electronically Signed   By: Maisie Fus  Register   On: 10/23/2014 08:04   Ct Head Wo Contrast  10/23/2014   CLINICAL DATA:  Acute onset of generalized weakness and confusion. Initial  encounter.  EXAM: CT HEAD WITHOUT CONTRAST  TECHNIQUE: Contiguous axial images were obtained from the base of the skull through the vertex without intravenous contrast.  COMPARISON:  CT of the head performed 02/06/2014  FINDINGS: There is no evidence of acute infarction, mass lesion, or intra- or extra-axial hemorrhage on CT.  Scattered periventricular white matter change likely reflects small vessel ischemic microangiopathy.  The posterior fossa, including the cerebellum, brainstem and fourth ventricle, is within normal limits. The third and lateral ventricles, and basal ganglia are unremarkable in appearance. The cerebral hemispheres are symmetric in appearance, with normal gray-white differentiation. No mass effect or midline shift is seen.  There is no evidence of fracture; visualized osseous structures  are unremarkable in appearance. The visualized portions of the orbits are within normal limits. The paranasal sinuses and mastoid air cells are well-aerated. No significant soft tissue abnormalities are seen.  IMPRESSION: 1. No acute intracranial pathology seen on CT. 2. Mild small vessel ischemic microangiopathy.   Electronically Signed   By: Roanna Raider M.D.   On: 10/23/2014 03:04   Mr Brain Wo Contrast  10/23/2014   CLINICAL DATA:  Altered mental status.  Nausea and vomiting.  EXAM: MRI HEAD WITHOUT CONTRAST  TECHNIQUE: Multiplanar, multiecho pulse sequences of the brain and surrounding structures were obtained without intravenous contrast.  COMPARISON:  10/23/2014 head CT  FINDINGS: Images are mildly degraded by motion artifact.  There is no evidence of acute infarct, intracranial hemorrhage, mass, midline shift, or extra-axial fluid collection. Ventricles and sulci are within normal limits for age. Periventricular white matter T2 hyperintensities are nonspecific but compatible with mild chronic small vessel ischemic disease.  Prior right cataract extraction is noted. Paranasal sinuses and mastoid air  cells are clear. Major intracranial vascular flow voids are preserved.  IMPRESSION: 1. No acute intracranial abnormality. 2. Mild chronic small vessel ischemic disease.   Electronically Signed   By: Sebastian Ache   On: 10/23/2014 11:53   US Abdomen Complete  10/23/2014   CLINICAL DATA:  Nausea and vomiting  EXAM: ULTRASOUND ABDOMEN COMPLETE  COMPARISON:  CT abdomen/pelvis 10/10/2014  FINDINGS: Gallbladder: No gallstones or wall thickening visualized. No sonographic Murphy sign noted.  Common bile duct: Diameter: 1.1 cm  Liver: No focal lesion identified. Within normal limits in parenchymal echogenicity.  IVC: No abnormality visualized.  Pancreas: Prominence of the pancreatic head reidentified without peripancreatic fluid or ductal dilatation.  Spleen: Size and appearance within normal limits.  Right Kidney: Length: 9.3 cm. Increased renal cortical echogenicity without hydronephrosis or focal mass.  Left Kidney: Length: 9.0 cm. Increased renal cortical echogenicity without hydronephrosis or focal mass.  Abdominal aorta: No aneurysm visualized.  Other findings: None.  IMPRESSION: Pancreatic head fullness compatible with similar finding at the dissimilar recent prior exam. Correlation with amylase and lipase could be helpful to determine whether this represents pancreatitis or whether followup imaging is indicated to exclude the presence of a mass.  Stable common duct dilatation without focal cut off, 1.1 cm maximally without visible filling defect. However, this constellation of findings could also be seen with gallstone pancreatitis and MRCP with contrast could be helpful if further evaluation for possible common duct filling defect is desired.  Echogenic renal parenchyma suggesting medical renal disease.   Electronically Signed   By: Christiana Pellant M.D.   On: 10/23/2014 11:00    Scheduled Meds: . aspirin EC  81 mg Oral Daily  . calcitRIOL  0.25 mcg Oral Daily  . calcium acetate  667 mg Oral TID WC  .  carvedilol  25 mg Oral BID WC  . [START ON 10/31/2014] darbepoetin (ARANESP) injection - DIALYSIS  25 mcg Intravenous Once  . heparin  5,000 Units Subcutaneous 3 times per day  . insulin aspart  0-9 Units Subcutaneous TID WC  . insulin glargine  12 Units Subcutaneous QHS  . multivitamin  1 tablet Oral QHS  . pantoprazole  40 mg Oral BID  . sevelamer carbonate  800 mg Oral TID WC  . sodium chloride  3 mL Intravenous Q12H   Continuous Infusions:   Time spent: 35 minutes  Nastassja Witkop L  [facility] Pager [phone]. If 7PM-7AM, please contact night-coverage at [email or web address], password [facility]  10/24/2014, 9:35 AM  LOS: 1 day

## 2014-10-25 ENCOUNTER — Inpatient Hospital Stay (HOSPITAL_COMMUNITY): Payer: Medicaid Other

## 2014-10-25 LAB — FOLATE RBC
Folate, Hemolysate: >620 ng/mL
Folate, RBC: >1873 ng/mL (ref >498)
Hematocrit: 33.1 % — ABNORMAL LOW (ref 34.0–46.6)

## 2014-10-25 LAB — GLUCOSE, CAPILLARY
GLUCOSE-CAPILLARY: 278 mg/dL — AB (ref 65–99)
GLUCOSE-CAPILLARY: 137 mg/dL — AB (ref 65–99)
Glucose-Capillary: 235 mg/dL — ABNORMAL HIGH (ref 65–99)
Glucose-Capillary: 332 mg/dL — ABNORMAL HIGH (ref 65–99)

## 2014-10-25 LAB — LEVETIRACETAM LEVEL: LEVETIRACETAM: 2.1 ug/mL — AB (ref 10.0–40.0)

## 2014-10-25 MED ORDER — AMLODIPINE BESYLATE 10 MG PO TABS
10.0000 mg | ORAL_TABLET | Freq: Every day | ORAL | Status: DC
  Administered 2014-10-25 – 2014-10-26 (×2): 10 mg via ORAL
  Filled 2014-10-25 (×2): qty 1

## 2014-10-25 MED ORDER — HYDRALAZINE HCL 100 MG PO TABS: 50.0000 mg | ORAL_TABLET | Freq: Three times a day (TID) | ORAL | Status: DC

## 2014-10-25 MED ORDER — HYDRALAZINE HCL 20 MG/ML IJ SOLN
10.0000 mg | Freq: Once | INTRAMUSCULAR | Status: AC
  Administered 2014-10-25: 10 mg via INTRAVENOUS
  Filled 2014-10-25: qty 1

## 2014-10-25 MED ORDER — LIVING WELL WITH DIABETES BOOK
Freq: Once | Status: AC
  Administered 2014-10-25: 10:00:00
  Filled 2014-10-25: qty 1

## 2014-10-25 MED ORDER — POLYETHYLENE GLYCOL 3350 17 G PO PACK
17.0000 g | PACK | Freq: Every day | ORAL | Status: DC
  Administered 2014-10-25 – 2014-10-26 (×2): 17 g via ORAL
  Filled 2014-10-25 (×2): qty 1

## 2014-10-25 MED ORDER — INSULIN GLARGINE 100 UNIT/ML ~~LOC~~ SOLN
22.0000 [IU] | Freq: Every day | SUBCUTANEOUS | Status: DC
  Administered 2014-10-25: 22 [IU] via SUBCUTANEOUS
  Filled 2014-10-25 (×2): qty 0.22

## 2014-10-25 MED ORDER — HYDRALAZINE HCL 50 MG PO TABS
50.0000 mg | ORAL_TABLET | Freq: Three times a day (TID) | ORAL | Status: DC
  Administered 2014-10-25 (×2): 50 mg via ORAL
  Filled 2014-10-25 (×7): qty 1

## 2014-10-25 NOTE — Progress Notes (Signed)
Subjective:   No complaints   Objective Filed Vitals:   10/25/14 0500 10/25/14 0600 10/25/14 0700 10/25/14 1123  BP: 194/72 203/72 164/115 179/69  Pulse:    64  Temp:   98.9 F (37.2 C) 98.3 F (36.8 C)  TempSrc:   Oral Oral  Resp: 13 14 14 18   Height:      Weight:      SpO2:    100%   Physical Exam General: alert and oriented. No acute distress. Family visiting Heart: RRR  Lungs: CTA, unlabored  Abdomen: soft, nontender +BS  Extremities: no edema Dialysis Access: L AVF +b.t  Dialysis Orders: [facility]  4 hours 60.5kgs 2K/2Ca+ Heparin 1800 u  Profile 4  Aranesp 25 q 2 weeks - last given 6/8 Calcitriol 1.25 1 x week- last given 6/10   Assessment/Plan: 1. Hyperglycemia- >600 on admission- now variable, 400 overnight, 235 this AM. Per primary. Recent a1c 15.5. Diabetes coordinator following 2. Acute encephalopathy- head CT and MRI no acute findings. More alert now 3. N/V- abd xray no acute finding- recent pancreatitis, lipase now 34. US- pancreatic head fullness compatible with similar finding/stable common duct dilatation. Afebrile.  4. ESRD - [facility]. HD tomorrow 5. Hypertension/volume - 179/69 under edw which was just lowered, small gains. Amlodipine, clonidine and carvedilol- home meds.  6. Anemia - hgb 11.9 last ESA given 6/8- due 6/22. Last tsat 52, no FE. Watch CBC 7. Metabolic bone disease - ca+ 9.1 renvela and phoslo- hold- last phos 2.7. Last PTH 405, cont weekly calcitriol on friday Nutrition - alb 3.1 clears. Vitamin 9. dispo- family refusing SNF  Jetty Duhamel, NP [facility] Beeper [phone] 10/25/2014,12:52 PM  LOS: 2 days   Pt seen, examined and agree w A/P as above.  Vinson Moselle MD pager [phone]    cell [phone] 10/25/2014, 2:14 PM    Additional Objective Labs: Basic Metabolic Panel:  Recent Labs Lab 10/22/14 2336 10/23/14 0535 10/23/14 1344 10/24/14 0249  NA 131*  --  144 133*  K 4.1  --  3.8 5.4*   CL 91*  --  104 98*  CO2 20*  --  30 21*  GLUCOSE 819*  --  98 415*  BUN 19  --  22* 11  CREATININE 3.34* 3.29* 3.50* 2.00*  CALCIUM 9.1  --  9.4 9.0   Liver Function Tests:  Recent Labs Lab 10/22/14 2336 10/23/14 0535  AST 32 22  ALT 21 19  ALKPHOS 199* 180*  BILITOT 1.3* 0.9  PROT 6.6 6.4*  ALBUMIN 3.1* 3.1*    Recent Labs Lab 10/23/14 0535  LIPASE 34   CBC:  Recent Labs Lab 10/22/14 2336 10/23/14 0535  WBC 7.9 8.1  NEUTROABS 6.4  --   HGB 12.2 11.9*  HCT 36.4 36.1  MCV 94.8 94.5  PLT 260 209   Blood Culture    Component Value Date/Time   SDES BLOOD RIGHT NECK 10/11/2014 1200   SPECREQUEST BOTTLES DRAWN AEROBIC AND ANAEROBIC 10CCS  10/11/2014 1200   CULT  10/11/2014 1200    NO GROWTH 5 DAYS Note: Culture results may be compromised due to an excessive volume of blood received in culture bottles. Performed at [facility]    REPTSTATUS 10/17/2014 FINAL 10/11/2014 1200    Cardiac Enzymes:  Recent Labs Lab 10/23/14 0535  TROPONINI <0.03   CBG:  Recent Labs Lab 10/24/14 1226 10/24/14 1619 10/24/14 2130 10/25/14 0734 10/25/14 1119  GLUCAP 226* 169* 401* 278* 235*  Iron Studies: No results for input(s): IRON, TIBC, TRANSFERRIN, FERRITIN in the last 72 hours. @ Studies/Results: No results found. Medications:   . amLODipine  10 mg Oral Daily  . aspirin EC  81 mg Oral Daily  . calcitRIOL  0.25 mcg Oral Daily  . calcium acetate  667 mg Oral TID WC  . carvedilol  25 mg Oral BID WC  . [START ON 10/31/2014] darbepoetin (ARANESP) injection - DIALYSIS  25 mcg Intravenous Once  . heparin  5,000 Units Subcutaneous 3 times per day  . hydrALAZINE  50 mg Oral TID  . insulin aspart  0-9 Units Subcutaneous TID WC  . insulin glargine  22 Units Subcutaneous QHS  . multivitamin  1 tablet Oral QHS  . pantoprazole  40 mg Oral BID  . polyethylene glycol  17 g Oral Daily  . sevelamer carbonate  800 mg Oral TID WC  . sodium chloride   3 mL Intravenous Q12H

## 2014-10-25 NOTE — Evaluation (Signed)
Occupational Therapy Evaluation Patient Details Name: Madeline [name] MRN: [id number] DOB: 1955-08-15 Today's Date: 10/25/2014    History of Present Illness 13 aa female with DM, ESRD, recent admission for severe hyperglycemia, encephalopathy and abnormal pancreas on CT scan without chemical pancreatitis. Also, had been started on keppra by neuro consult for unexplained asterixis. Readmitted with encephalopathy and DKA. PMH includes ESRD with HD, DM; COPD, CVA, anasarca    Clinical Impression   Pt admitted with above. She demonstrates the below listed deficits and will benefit from continued OT to maximize safety and independence with BADLs.  Pt presents to OT with generalized weakness and impaired cognition.  She lives with her sister.  Currently, she requires min guard assist for ADLs.  Recommend HHOT at discharge.  Will follow acutely.       Follow Up Recommendations  Home health OT;Supervision/Assistance - 24 hour    Equipment Recommendations  None recommended by OT    Recommendations for Other Services       Precautions / Restrictions Precautions Precautions: Fall Precaution Comments: h/o falls Restrictions Weight Bearing Restrictions: No      Mobility Bed Mobility Overal bed mobility: Needs Assistance Bed Mobility: Supine to Sit;Sit to Supine     Supine to sit: Supervision Sit to supine: Supervision      Transfers Overall transfer level: Needs assistance Equipment used: Rolling walker (2 wheeled) Transfers: Sit to/from [employer] Sit to Stand: Min guard Stand pivot transfers: Min guard       General transfer comment: min guard for balance     Balance Overall balance assessment: Needs assistance Sitting-balance support: Feet supported;Feet unsupported Sitting balance-Leahy Scale: Good Sitting balance - Comments: able to adjust socks without LOB    Standing balance support: Single extremity supported;During functional activity Standing  balance-Leahy Scale: Poor Standing balance comment: reliant on UE support                             ADL Overall ADL's : Needs assistance/impaired Eating/Feeding: Independent   Grooming: Wash/dry hands;Wash/dry face;Oral care;Brushing hair;Min guard;Standing   Upper Body Bathing: Set up;Sitting   Lower Body Bathing: Min guard;Sit to/from stand   Upper Body Dressing : Set up;Sitting   Lower Body Dressing: Min guard   Toilet Transfer: Min guard;Ambulation;Grab bars;RW;Regular Toilet;Comfort height toilet   Toileting- Clothing Manipulation and Hygiene: Min guard;Sit to/from stand       Functional mobility during ADLs: Min guard General ADL Comments: Pt moves slowly.     Vision Additional Comments: Pt noted with decreased scanning during functional mobility.  Veers to Rt.  Pt does not have glasses at hospital. Unsure of her baseline   Perception     Praxis      Pertinent Vitals/Pain Pain Assessment: No/denies pain     Hand Dominance Right   Extremity/Trunk Assessment Upper Extremity Assessment Upper Extremity Assessment: Generalized weakness   Lower Extremity Assessment Lower Extremity Assessment: Defer to PT evaluation   Cervical / Trunk Assessment Cervical / Trunk Assessment: Normal   Communication Communication Communication: No difficulties   Cognition Arousal/Alertness: Awake/alert Behavior During Therapy: WFL for tasks assessed/performed Overall Cognitive Status: Impaired/Different from baseline Area of Impairment: Problem solving             Problem Solving: Slow processing General Comments: verbal perseveration noted    General Comments       Exercises       Shoulder Instructions  Home Living Family/patient expects to be discharged to:: Private residence Living Arrangements: Other relatives (sister) Available Help at Discharge: Family;Available 24 hours/day (sister 76 hoursl son lives nearby and helps) Type of Home:  Apartment Home Access: Level entry     Home Layout: One level     Bathroom Shower/Tub: Producer, television/film/video: Standard     Home Equipment: Environmental consultant - 4 wheels;Bedside commode;Shower seat;Grab bars - toilet   Additional Comments: Uses transport company to get to HD      Prior Functioning/Environment Level of Independence: Needs assistance  Gait / Transfers Assistance Needed: sister reports she sometimes has to assist with walking ADL's / Homemaking Assistance Needed: Assist for bathing, dressing, meal prep, housekeeping.  Son provides transport for errands. Assist with shower transfer.        OT Diagnosis: Generalized weakness;Cognitive deficits   OT Problem List: Decreased strength;Decreased activity tolerance;Impaired balance (sitting and/or standing);Impaired vision/perception;Decreased safety awareness;Decreased cognition   OT Treatment/Interventions: Self-care/ADL training;DME and/or AE instruction;Therapeutic activities;Cognitive remediation/compensation;Patient/family education;Balance training    OT Goals(Current goals can be found in the care plan section) Acute Rehab OT Goals Patient Stated Goal: go home OT Goal Formulation: With patient Time For Goal Achievement: 11/08/14 Potential to Achieve Goals: Good ADL Goals Pt Will Perform Grooming: with modified independence;standing Pt Will Perform Upper Body Bathing: with modified independence;sitting Pt Will Perform Lower Body Bathing: with modified independence;sit to/from stand Pt Will Perform Upper Body Dressing: with modified independence;sitting Pt Will Perform Lower Body Dressing: with modified independence;sit to/from stand Pt Will Transfer to Toilet: with modified independence;bedside commode;ambulating;grab bars;regular height toilet Pt Will Perform Toileting - Clothing Manipulation and hygiene: with modified independence;sit to/from stand Pt Will Perform Tub/Shower Transfer: Shower transfer;with  supervision;shower seat;rolling walker  OT Frequency: Min 2X/week   Barriers to D/C:            Co-evaluation              End of Session Equipment Utilized During Treatment: Rolling walker Nurse Communication: Mobility status  Activity Tolerance: Patient tolerated treatment well Patient left: in bed;with call bell/phone within reach;with family/visitor present   Time: 1610-9604 OT Time Calculation (min): 16 min Charges:  OT General Charges $OT Visit: 1 Procedure OT Evaluation $Initial OT Evaluation Tier I: 1 Procedure G-Codes:    Jeani Hawking M 11/16/2014, 4:37 PM

## 2014-10-25 NOTE — Progress Notes (Signed)
Spoke with patient and patient's daughter.  Was diagnosed with diabetes about 30 years ago. Started on insulin and lost 80 pounds during that time. (not sure how long) Then was changed to oral meds for a while.  Has been back on insulin for about 20 years now. Has always used the syringe and vial.  PCP is Dr. Benedetto Goad who regulates her insulin dosages. Takes Lantus daily and Humalog three times per day with meals.  Has a sister who lives with patient and helps with the cooking. Patient has been giving own insulin, but with the present confusion, will need to depend on family and in home help for support. The daughter is an Charity fundraiser at [employer] and [employer]. She states that the high blood sugars were from the pancreatitis and a new drug that the patient was on.  Will order dietician consult, will have family watch DM videos, have staff RNs check family members on administering insulin at home, how to check blood sugars at home, make sure there is a plan for patient's care at home before discharge. Will need to follow up with Dr. Andrey Campanile after discharge.  Will continue to follow while in hospital. Smith Mince RN BSN CDE

## 2014-10-25 NOTE — Progress Notes (Signed)
Nutrition Consult for Diet Education  RD consulted for nutrition education regarding diabetes and renal diet. Discussed diet recommendations with patient's sister, who she lives with. Patient usually eats 3 meals a day. She takes a snack with her when she goes to dialysis.   Lab Results  Component Value Date   HGBA1C 15.5* 10/13/2014    RD provided renal food pyramid handout to patient's sister. Discussed different food groups and their effects on blood sugar, emphasizing carbohydrate-containing foods.   Discussed importance of controlled and consistent carbohydrate intake throughout the day. Teach back method used.  Explained why diet restrictions are needed and provided lists of foods to limit/avoid that are high potassium, sodium, and phosphorus.   Discussed importance of protein intake at each meal and snack. Provided examples of how to maximize protein intake throughout the day.   Encouraged pt to discuss specific diet questions/concerns with RD at HD outpatient facility. Teach back method used.  Expect poor compliance.  Body mass index is 27.66 kg/(m^2). Pt meets criteria for overweight based on current BMI.  Current diet order is renal, CHO-modified, patient is consuming approximately 50% of meals at this time. Labs and medications reviewed. No further nutrition interventions warranted at this time. RD contact information provided. If additional nutrition issues arise, please re-consult RD.   Joaquin Courts, RD, LDN, CNSC Pager [phone] After Hours Pager [phone]

## 2014-10-25 NOTE — Evaluation (Signed)
Physical Therapy Evaluation Patient Details Name: Madeline [name] MRN: [id number] DOB: October 17, 1955 Today's Date: 10/25/2014   History of Present Illness  57 aa female with DM, ESRD, recent admission for severe hyperglycemia, encephalopathy and abnormal pancreas on CT scan without chemical pancreatitis. Also, had been started on keppra by neuro consult for unexplained asterixis. Readmitted with encephalopathy and DKA. PMH includes ESRD with HD, DM; COPD, CVA, anasarca   Clinical Impression  Pt admitted with/for DKA and encephalopathy.  Pt currently limited functionally due to the problems listed below.  (see problems list.)  Pt will benefit from PT to maximize function and safety to be able to get home safely with available assist of sister.     Follow Up Recommendations Home health PT;Supervision/Assistance - 24 hour    Equipment Recommendations  None recommended by PT    Recommendations for Other Services       Precautions / Restrictions Precautions Precautions: Fall Precaution Comments: h/o falls Restrictions Weight Bearing Restrictions: No      Mobility  Bed Mobility Overal bed mobility: Needs Assistance Bed Mobility: Supine to Sit;Sit to Supine     Supine to sit: Supervision Sit to supine: Supervision      Transfers Overall transfer level: Needs assistance Equipment used: Rolling walker (2 wheeled) Transfers: Sit to/from Stand Sit to Stand: Min guard Stand pivot transfers: Min guard       General transfer comment: min guard for balance   Ambulation/Gait Ambulation/Gait assistance: Min guard Ambulation Distance (Feet): 200 Feet Assistive device: Rolling walker (2 wheeled) Gait Pattern/deviations: Step-through pattern Gait velocity: Decreased   General Gait Details: generally steady steps.  Safe use of the RW  Stairs            Wheelchair Mobility    Modified Rankin (Stroke Patients Only)       Balance Overall balance assessment: Needs  assistance Sitting-balance support: Feet supported;Feet unsupported Sitting balance-Leahy Scale: Good Sitting balance - Comments: able to adjust socks without LOB    Standing balance support: Single extremity supported;During functional activity Standing balance-Leahy Scale: Poor Standing balance comment: reliant on ue's                             Pertinent Vitals/Pain Pain Assessment: No/denies pain    Home Living Family/patient expects to be discharged to:: Private residence Living Arrangements: Other relatives (sister) Available Help at Discharge: Family;Available 24 hours/day (sister 36 hoursl son lives nearby and helps) Type of Home: Apartment Home Access: Level entry     Home Layout: One level Home Equipment: Walker - 4 wheels;Bedside commode;Shower seat;Grab bars - toilet Additional Comments: Uses transport company to get to HD    Prior Function Level of Independence: Needs assistance   Gait / Transfers Assistance Needed: sister reports she sometimes has to assist with walking  ADL's / Homemaking Assistance Needed: Assist for bathing, dressing, meal prep, housekeeping.  Son provides transport for errands. Assist with shower transfer.        Hand Dominance   Dominant Hand: Right    Extremity/Trunk Assessment   Upper Extremity Assessment: Defer to OT evaluation           Lower Extremity Assessment: Generalized weakness;Overall [facility] for tasks assessed      Cervical / Trunk Assessment: Normal  Communication   Communication: No difficulties  Cognition Arousal/Alertness: Awake/alert Behavior During Therapy: WFL for tasks assessed/performed Overall Cognitive Status: Impaired/Different from baseline Area of Impairment: Problem solving  Problem Solving: Slow processing General Comments: verbal perseveration noted     General Comments General comments (skin integrity, edema, etc.): sister present during eval     Exercises         Assessment/Plan    PT Assessment Patient needs continued PT services  PT Diagnosis Generalized weakness (decreased activity tolerance)   PT Problem List Decreased strength;Decreased activity tolerance;Decreased balance;Decreased mobility  PT Treatment Interventions DME instruction;Gait training;Functional mobility training;Therapeutic activities;Balance training;Patient/family education   PT Goals (Current goals can be found in the Care Plan section) Acute Rehab PT Goals Patient Stated Goal: go home PT Goal Formulation: With patient Time For Goal Achievement: 11/01/14 Potential to Achieve Goals: Good    Frequency Min 3X/week   Barriers to discharge        Co-evaluation               End of Session   Activity Tolerance: Patient tolerated treatment well Patient left: with call bell/phone within reach;with family/visitor present;in chair           Time: 4098-1191 PT Time Calculation (min) (ACUTE ONLY): 20 min   Charges:   PT Evaluation $Initial PT Evaluation Tier I: 1 Procedure     PT G Codes:        Dontavian Marchi, Eliseo Gum 10/25/2014, 4:58 PM 10/25/2014  [place]irk Bing, PT [phone]  (pager)

## 2014-10-25 NOTE — Progress Notes (Signed)
[facility] PROGRESS NOTE  Madeline [name] ZOX:[id number] DOB: 1955-09-30 DOA: 10/22/2014 PCP: Pamelia Hoit, MD  Summary 68 aa female with DM, ESRD, recent admission for severe hyperglycemia, encephalopathy and abnormal pancreas on CT scan without chemical pancreatitis. Also, had been started on keppra by neuro consult for unexplained asterixis.  Readmitted with encephalopathy and DKA.   Assessment/Plan:  Principal Problem:   Acute encephalopathy, multifactorial: Secondary to profound hyperglycemia, as well as likely somnolence from Keppra effect. Keppra has been stopped. Would not try an alternate agent if her asterixis returns at this time. Much improved today. Will transfer out of the unit. Active Problems:   DKA, type 2, not at goal:  Patient admits to frequently skipping insulin doses sounds like particularly Lantus. She reports that it "makes her feel tired" and that sometimes her blood glucose is dropped. Dr. Arlean Hopping concerned about depression symptoms contributing to her noncompliance. Patient denies depression symptoms and does not want medications or further counseling.  Lantus increased to 18 units last night. Blood glucose this morning is reportedly above 200. Not yet in the records. Will increase to 22 units and monitor. Transfer out of stepdown unit. Have asked diabetic educator to work more with patient for educating her about the importance of compliance.   ESRD on hemodialysis:  Dialysis per nephrology   Nausea & vomiting: Has resolved. Likely related to DKA. However, has had persistent pancreatic head fullness on imaging, and chronic common duct dilatation without definite cut off point. LFTs and lipase normal. Nevertheless, will check an MRCP during the hospitalization to further evaluate the area.   Hypertension:  Not well controlled. Will resume her home antihypertensives.   Abnormal abdominal ultrasound and CAT scan last admission: See above.  PT/OT evaluation  pending. Social work spoke with patient and family and regardless of the recommendations, they will refuse skilled nursing facility.  Code Status:  full Family Communication:  Sister at bedside Disposition Plan:    Consultants:  Nephrology  Procedures:     Antibiotics:    HPI/Subjective: Per nurse, woke up and became much less lethargic yesterday afternoon at about 3. Patient denies current abdominal pain nausea vomiting. Does not recall me from previous 2 days.  Objective: Filed Vitals:   10/25/14 0700  BP: 164/115  Pulse:   Temp:   Resp: 14   No intake or output data in the 24 hours ending 10/25/14 0803 Filed Weights   10/23/14 0430 10/23/14 1500 10/23/14 1906  Weight: 58.5 kg (128 lb 15.5 oz) 59.1 kg (130 lb 4.7 oz) 58 kg (127 lb 13.9 oz)    Exam:   General:  Alert! Oriented and appropriate. Interactive.  Cardiovascular: Regular rate rhythm without murmurs gallops rubs  Respiratory: Clear to auscultation bilaterally without wheezes rhonchi or rales  Abdomen: Soft nontender nondistended  Ext: No clubbing cyanosis or edema. No asterixis present.  Basic Metabolic Panel:  Recent Labs Lab 10/22/14 2336 10/23/14 0535 10/23/14 1344 10/24/14 0249  NA 131*  --  144 133*  K 4.1  --  3.8 5.4*  CL 91*  --  104 98*  CO2 20*  --  30 21*  GLUCOSE 819*  --  98 415*  BUN 19  --  22* 11  CREATININE 3.34* 3.29* 3.50* 2.00*  CALCIUM 9.1  --  9.4 9.0   Liver Function Tests:  Recent Labs Lab 10/22/14 2336 10/23/14 0535  AST 32 22  ALT 21 19  ALKPHOS 199* 180*  BILITOT 1.3* 0.9  PROT 6.6  6.4*  ALBUMIN 3.1* 3.1*    Recent Labs Lab 10/23/14 0535  LIPASE 34    Recent Labs Lab 10/23/14 0535  AMMONIA 18   CBC:  Recent Labs Lab 10/22/14 2336 10/23/14 0535  WBC 7.9 8.1  NEUTROABS 6.4  --   HGB 12.2 11.9*  HCT 36.4 36.1  MCV 94.8 94.5  PLT 260 209   Cardiac Enzymes:  Recent Labs Lab 10/23/14 0535  TROPONINI <0.03   BNP (last 3  results) No results for input(s): BNP in the last 8760 hours.  ProBNP (last 3 results) No results for input(s): PROBNP in the last 8760 hours.  CBG:  Recent Labs Lab 10/23/14 2144 10/24/14 0802 10/24/14 1226 10/24/14 1619 10/24/14 2130  GLUCAP 294* 406* 226* 169* 401*    Recent Results (from the past 240 hour(s))  MRSA PCR Screening     Status: None   Collection Time: 10/23/14  4:43 AM  Result Value Ref Range Status   MRSA by PCR NEGATIVE NEGATIVE Final    Comment:        The GeneXpert MRSA Assay (FDA approved for NASAL specimens only), is one component of a comprehensive MRSA colonization surveillance program. It is not intended to diagnose MRSA infection nor to guide or monitor treatment for MRSA infections.      Studies: Mr Brain Wo Contrast  10/23/2014   CLINICAL DATA:  Altered mental status.  Nausea and vomiting.  EXAM: MRI HEAD WITHOUT CONTRAST  TECHNIQUE: Multiplanar, multiecho pulse sequences of the brain and surrounding structures were obtained without intravenous contrast.  COMPARISON:  10/23/2014 head CT  FINDINGS: Images are mildly degraded by motion artifact.  There is no evidence of acute infarct, intracranial hemorrhage, mass, midline shift, or extra-axial fluid collection. Ventricles and sulci are within normal limits for age. Periventricular white matter T2 hyperintensities are nonspecific but compatible with mild chronic small vessel ischemic disease.  Prior right cataract extraction is noted. Paranasal sinuses and mastoid air cells are clear. Major intracranial vascular flow voids are preserved.  IMPRESSION: 1. No acute intracranial abnormality. 2. Mild chronic small vessel ischemic disease.   Electronically Signed   By: Sebastian Ache   On: 10/23/2014 11:53   US Abdomen Complete  10/23/2014   CLINICAL DATA:  Nausea and vomiting  EXAM: ULTRASOUND ABDOMEN COMPLETE  COMPARISON:  CT abdomen/pelvis 10/10/2014  FINDINGS: Gallbladder: No gallstones or wall  thickening visualized. No sonographic Murphy sign noted.  Common bile duct: Diameter: 1.1 cm  Liver: No focal lesion identified. Within normal limits in parenchymal echogenicity.  IVC: No abnormality visualized.  Pancreas: Prominence of the pancreatic head reidentified without peripancreatic fluid or ductal dilatation.  Spleen: Size and appearance within normal limits.  Right Kidney: Length: 9.3 cm. Increased renal cortical echogenicity without hydronephrosis or focal mass.  Left Kidney: Length: 9.0 cm. Increased renal cortical echogenicity without hydronephrosis or focal mass.  Abdominal aorta: No aneurysm visualized.  Other findings: None.  IMPRESSION: Pancreatic head fullness compatible with similar finding at the dissimilar recent prior exam. Correlation with amylase and lipase could be helpful to determine whether this represents pancreatitis or whether followup imaging is indicated to exclude the presence of a mass.  Stable common duct dilatation without focal cut off, 1.1 cm maximally without visible filling defect. However, this constellation of findings could also be seen with gallstone pancreatitis and MRCP with contrast could be helpful if further evaluation for possible common duct filling defect is desired.  Echogenic renal parenchyma suggesting medical renal disease.  Electronically Signed   By: Christiana Pellant M.D.   On: 10/23/2014 11:00    Scheduled Meds: . aspirin EC  81 mg Oral Daily  . calcitRIOL  0.25 mcg Oral Daily  . calcium acetate  667 mg Oral TID WC  . carvedilol  25 mg Oral BID WC  . [START ON 10/31/2014] darbepoetin (ARANESP) injection - DIALYSIS  25 mcg Intravenous Once  . heparin  5,000 Units Subcutaneous 3 times per day  . insulin aspart  0-9 Units Subcutaneous TID WC  . insulin glargine  18 Units Subcutaneous QHS  . multivitamin  1 tablet Oral QHS  . pantoprazole  40 mg Oral BID  . sevelamer carbonate  800 mg Oral TID WC  . sodium chloride  3 mL Intravenous Q12H    Continuous Infusions:   Time spent: 35 minutes  Ripken Rekowski L  [facility] Pager [phone]. If 7PM-7AM, please contact night-coverage at [email or web address], password [facility] 10/25/2014, 8:03 AM  LOS: 2 days

## 2014-10-25 NOTE — Progress Notes (Signed)
Called by RN that postprandial pain severe. Downgraded to clears. Await MRCP. Already on ppi bid  Crista Curb, MD

## 2014-10-26 DIAGNOSIS — I10 Essential (primary) hypertension: Secondary | ICD-10-CM

## 2014-10-26 DIAGNOSIS — E1310 Other specified diabetes mellitus with ketoacidosis without coma: Principal | ICD-10-CM

## 2014-10-26 DIAGNOSIS — Z992 Dependence on renal dialysis: Secondary | ICD-10-CM

## 2014-10-26 DIAGNOSIS — G9340 Encephalopathy, unspecified: Secondary | ICD-10-CM

## 2014-10-26 DIAGNOSIS — N186 End stage renal disease: Secondary | ICD-10-CM

## 2014-10-26 LAB — RENAL FUNCTION PANEL
ANION GAP: 8 (ref 5–15)
Albumin: 2.6 g/dL — ABNORMAL LOW (ref 3.5–5.0)
BUN: 27 mg/dL — ABNORMAL HIGH (ref 6–20)
CALCIUM: 9.2 mg/dL (ref 8.9–10.3)
CO2: 26 mmol/L (ref 22–32)
Chloride: 101 mmol/L (ref 101–111)
Creatinine, Ser: 3.29 mg/dL — ABNORMAL HIGH (ref 0.44–1.00)
GFR, EST AFRICAN AMERICAN: 17 mL/min — AB (ref >60)
GFR, EST NON AFRICAN AMERICAN: 15 mL/min — AB (ref >60)
GLUCOSE: 89 mg/dL (ref 65–99)
PHOSPHORUS: 2.5 mg/dL (ref 2.5–4.6)
POTASSIUM: 3.9 mmol/L (ref 3.5–5.1)
SODIUM: 135 mmol/L (ref 135–145)

## 2014-10-26 LAB — CBC
HCT: 35.4 % — ABNORMAL LOW (ref 36.0–46.0)
Hemoglobin: 11.6 g/dL — ABNORMAL LOW (ref 12.0–15.0)
MCH: 30.9 pg (ref 26.0–34.0)
MCHC: 32.8 g/dL (ref 30.0–36.0)
MCV: 94.4 fL (ref 78.0–100.0)
Platelets: 142 10*3/uL — ABNORMAL LOW (ref 150–400)
RBC: 3.75 MIL/uL — AB (ref 3.87–5.11)
RDW: 13.8 % (ref 11.5–15.5)
WBC: 6.3 10*3/uL (ref 4.0–10.5)

## 2014-10-26 LAB — GLUCOSE, CAPILLARY
GLUCOSE-CAPILLARY: 64 mg/dL — AB (ref 65–99)
GLUCOSE-CAPILLARY: 188 mg/dL — AB (ref 65–99)

## 2014-10-26 MED ORDER — INSULIN GLARGINE 100 UNIT/ML ~~LOC~~ SOLN
20.0000 [IU] | Freq: Every day | SUBCUTANEOUS | Status: DC
  Filled 2014-10-26: qty 0.2

## 2014-10-26 MED ORDER — INSULIN GLARGINE 100 UNIT/ML ~~LOC~~ SOLN: 20.0000 [IU] | Freq: Every day | SUBCUTANEOUS | Status: DC

## 2014-10-26 MED ORDER — METOCLOPRAMIDE HCL 5 MG PO TABS
5.0000 mg | ORAL_TABLET | Freq: Three times a day (TID) | ORAL | Status: DC
  Filled 2014-10-26: qty 1

## 2014-10-26 MED ORDER — METOCLOPRAMIDE HCL 5 MG PO TABS: 5.0000 mg | ORAL_TABLET | Freq: Three times a day (TID) | ORAL | Status: AC

## 2014-10-26 NOTE — Progress Notes (Signed)
Subjective:   No complaints   Objective Filed Vitals:   10/25/14 0700 10/25/14 1123 10/25/14 2215 10/26/14 0619  BP: 164/115 179/69 134/64 121/58  Pulse:  64 61 56  Temp: 98.9 F (37.2 C) 98.3 F (36.8 C) 97.9 F (36.6 C) 98.4 F (36.9 C)  TempSrc: Oral Oral Oral Oral  Resp: Height:      Weight:    59.2 kg (130 lb 8.2 oz)  SpO2:  100% 100% 100%   Physical Exam General: alert and oriented. No acute distress. Family visiting Heart: RRR  Lungs: CTA, unlabored  Abdomen: soft, nontender +BS  Extremities: no edema Dialysis Access: L AVF +b.t  Dialysis Orders: [facility]  4 hours 60.5kgs 2K/2Ca+ Heparin 1800 u  Profile 4  Aranesp 25 q 2 weeks - last given 6/8 Calcitriol 1.25 1 x week- last given 6/10   Assessment/Plan: 1. Hyperglycemia- >600 on admission- per primary 2. Acute encephalopathy- head CT and MRI no acute findings. Ox 3 now 3. N/V- abd xray no acute finding- recent pancreatitis, lipase now 34. US- pancreatic head fullness compatible with similar finding/stable common duct dilatation. Afebrile.  4. ESRD - [facility]. HD today 5. Hypertension/volume - 179/69 under edw which was just lowered, small gains. Amlodipine, clonidine and carvedilol- home meds.  6. Anemia - hgb 11.9 last ESA given 6/8- due 6/22. Last tsat 52, no FE. Watch CBC 7. Metabolic bone disease - ca+ 9.1 renvela and phoslo- hold- last phos 2.7. Last PTH 405, cont weekly calcitriol on friday Nutrition - alb 3.1 clears. Vitamin 9. dispo- family refusing SNF   Vinson Moselle MD pager [phone]    cell [phone] 10/26/2014, 10:30 AM    Additional Objective Labs: Basic Metabolic Panel:  Recent Labs Lab 10/22/14 2336 10/23/14 0535 10/23/14 1344 10/24/14 0249  NA 131*  --  144 133*  K 4.1  --  3.8 5.4*  CL 91*  --  104 98*  CO2 20*  --  30 21*  GLUCOSE 819*  --  98 415*  BUN 19  --  22* 11  CREATININE 3.34* 3.29* 3.50* 2.00*  CALCIUM 9.1  --  9.4 9.0   Liver  Function Tests:  Recent Labs Lab 10/22/14 2336 10/23/14 0535  AST 32 22  ALT 21 19  ALKPHOS 199* 180*  BILITOT 1.3* 0.9  PROT 6.6 6.4*  ALBUMIN 3.1* 3.1*    Recent Labs Lab 10/23/14 0535  LIPASE 34   CBC:  Recent Labs Lab 10/22/14 2336 10/23/14 0535 10/24/14 1019  WBC 7.9 8.1  --   NEUTROABS 6.4  --   --   HGB 12.2 11.9*  --   HCT 36.4 36.1 33.1*  MCV 94.8 94.5  --   PLT 260 209  --    Blood Culture    Component Value Date/Time   SDES BLOOD RIGHT NECK 10/11/2014 1200   SPECREQUEST BOTTLES DRAWN AEROBIC AND ANAEROBIC 10CCS  10/11/2014 1200   CULT  10/11/2014 1200    NO GROWTH 5 DAYS Note: Culture results may be compromised due to an excessive volume of blood received in culture bottles. Performed at [facility]    REPTSTATUS 10/17/2014 FINAL 10/11/2014 1200    Cardiac Enzymes:  Recent Labs Lab 10/23/14 0535  TROPONINI <0.03   CBG:  Recent Labs Lab 10/25/14 0734 10/25/14 1119 10/25/14 1640 10/25/14 2218 10/26/14 0808  GLUCAP 278* 235* 332* 137* 64*   Iron Studies: No results for input(s): IRON, TIBC,  TRANSFERRIN, FERRITIN in the last 72 hours. @lablastinr3 @ Studies/Results: Mr Abdomen Mrcp Wo Cm  10/26/2014   CLINICAL DATA:  59 year old female with history of diabetes and end-stage renal disease, recently admitted for severe hyperglycemia. Abnormal pancreas on CT scan. Currently admitted with encephalopathy and diabetic ketoacidosis.  EXAM: MRI ABDOMEN WITHOUT CONTRAST  (INCLUDING MRCP)  TECHNIQUE: Multiplanar multisequence MR imaging of the abdomen was performed. Heavily T2-weighted images of the biliary and pancreatic ducts were obtained, and three-dimensional MRCP images were rendered by post processing.  COMPARISON:  CT of the abdomen and pelvis 10/10/2014. Abdominal ultrasound 10/23/2014.  FINDINGS: Lower chest:  Mild cardiomegaly.  Otherwise, unremarkable.  Hepatobiliary: Diffuse low signal intensity throughout the hepatic parenchyma on  T2 weighted images, suggestive of iron deposition. No discrete cystic or solid hepatic lesions are confidently identified on today's noncontrast MRI examination. MRCP demonstrates no intrahepatic biliary ductal dilatation. Gallbladder is nearly completely decompressed. Common hepatic duct is slightly dilated measuring up to 9 x 11 mm (mean diameter of 10 mm). Common bile duct also mildly dilated measuring 9 mm in diameter in the porta hepatis. No filling defect within the ducts to suggest the presence of retained ductal stones. The common bile duct abruptly tapers in the region of the pancreatic head.  Pancreas: While there is some mild subjective fullness of the pancreatic head on today's examination, there is no discrete mass identified. Diffusion-weighted images demonstrate node unusual diffusion restriction in the region of the pancreatic head. The body and tail of the pancreas are grossly normal in appearance. Specifically, MRCP images demonstrate no pancreatic ductal dilatation.  Spleen: Low signal intensity throughout the spleen on T2 weighted images may also suggest a mild iron deposition.  Adrenals/Urinary Tract: The unenhanced appearance of the kidneys and adrenal glands bilaterally is normal.  Stomach/Bowel: Visualized portions are unremarkable.  Vascular/Lymphatic: No definite aneurysm identified in the visualized abdominal vasculature on today's noncontrast MRI examination.  Other: No significant volume of ascites noted in the visualized portions of the peritoneal cavity.  Musculoskeletal: No aggressive osseous lesions noted in the visualized portions of the skeleton.  IMPRESSION: 1. While there is some mild subjective fullness in the pancreatic head. No discrete mass is identified on today's noncontrast MRI/MRCP examination. 2. There is some mild enlargement of the common bile duct and common hepatic duct. However, there is no associated intrahepatic biliary ductal dilatation. Additionally, the  gallbladder is not distended. There is no evidence of cholelithiasis or choledocholithiasis. The enlargement of the common bile duct and common hepatic duct is of uncertain etiology and significance, however, if there is some mild focal pancreatitis in the region of the pancreatic head this could be causing some mild compression of the distal common bile duct which could account for these findings. If for some reason there is strong clinical concern for an underlying mass in the pancreatic head, given that the patient cannot currently receive IV gadolinium, further evaluation with ERCP could be considered. 3. Iron deposition in the liver and spleen.   Electronically Signed   By: Trudie Reed M.D.   On: 10/26/2014 08:46   Medications:   . amLODipine  10 mg Oral Daily  . aspirin EC  81 mg Oral Daily  . calcitRIOL  0.25 mcg Oral Daily  . calcium acetate  667 mg Oral TID WC  . carvedilol  25 mg Oral BID WC  . [START ON 10/31/2014] darbepoetin (ARANESP) injection - DIALYSIS  25 mcg Intravenous Once  . heparin  5,000 Units Subcutaneous  3 times per day  . hydrALAZINE  50 mg Oral TID  . insulin aspart  0-9 Units Subcutaneous TID WC  . insulin glargine  22 Units Subcutaneous QHS  . multivitamin  1 tablet Oral QHS  . pantoprazole  40 mg Oral BID  . polyethylene glycol  17 g Oral Daily  . sevelamer carbonate  800 mg Oral TID WC  . sodium chloride  3 mL Intravenous Q12H

## 2014-10-26 NOTE — Progress Notes (Signed)
OT Cancellation Note  Patient Details Name: Madeline [name] MRN: [id number] DOB: 25-Oct-1955   Cancelled Treatment:    Reason Eval/Treat Not Completed: Patient at procedure or test/ unavailable (HD)  Galen Manila 10/26/2014, 11:25 AM

## 2014-10-26 NOTE — Care Management Note (Signed)
Case Management Note  Patient Details  Name: Madeline [name] MRN: [id number] Date of Birth: 02/17/1956  Subjective/Objective:         CM following for progression and d/c planning.           Action/Plan: HHRN with [place] per daughters request for medication management.   Expected Discharge Date:       10/26/2014           Expected Discharge Plan:  Home w Home Health Services  In-House Referral:     Discharge planning Services  CM Consult  Post Acute Care Choice:    Choice offered to:     DME Arranged:    DME Agency:     HH Arranged:  RN HH Agency:  [facility]  Status of Service:  Completed, signed off  Medicare Important Message Given:  No Date Medicare IM Given:    Medicare IM give by:    Date Additional Medicare IM Given:    Additional Medicare Important Message give by:     If discussed at Long Length of Stay Meetings, dates discussed:    Additional Comments:  Starlyn Skeans, RN 10/26/2014, 3:20 PM

## 2014-10-26 NOTE — Discharge Summary (Signed)
Physician Discharge Summary  Armonii Jamerson ZOX:[id number] DOB: [birth year] DOA: 10/22/2014  PCP: Pamelia Hoit, MD  Admit date: 10/22/2014 Discharge date: 10/26/2014  Time spent: 35 minutes  Recommendations for Outpatient Follow-up:  1. Outpatient gastric emptying  Discharge Diagnoses:  Principal Problem:   Acute encephalopathy Active Problems:   ESRD on hemodialysis   DKA, type 2, not at goal   Nausea & vomiting   Hypertension   Abnormal abdominal ultrasound   Discharge Condition: improved  Diet recommendation: renal/carb mod  Filed Weights   10/23/14 1500 10/23/14 1906 10/26/14 0619  Weight: 59.1 kg (130 lb 4.7 oz) 58 kg (127 lb 13.9 oz) 59.2 kg (130 lb 8.2 oz)    History of present illness:  Madeline [name] is a 59 y.o. female with history of ESRD on hemodialysis on Monday Wednesday Friday, hypertension, COPD, diabetes mellitus type 2 who was recently admitted last week for possible pancreatitis and at that time patient had required vasopressors was brought to the ER by patient's daughter outpatient was found to be increasingly confused. As per patient daughter patient was not doing well since yesterday morning and had at least one episode of nausea and vomiting at home and had not taken her medication as she was not feeling well and missed her dialysis. In the ER patient had another episode of nausea and vomiting and had complained of epigastric pain. Patient was appearing confused and blood sugars were found to be elevated more than 800 but not in DKA. CT of the head was negative for anything acute. Patient is afebrile and chest x-ray does not show any infiltrates. On my exam patient still appears confused blood sugar elevated abdomen appears benign on exam and will be admitted for further management.    Hospital Course:  Acute encephalopathy, multifactorial: Secondary to profound hyperglycemia, as well as likely somnolence from Keppra effect. Keppra has been stopped. Would not try  an alternate agent if her asterixis returns at this time.    DKA, type 2, not at goal: Patient admits to frequently skipping insulin doses sounds like particularly Lantus. She reports that it "makes her feel tired" and that sometimes her blood glucose is dropped. Dr. Arlean Hopping concerned about depression symptoms contributing to her noncompliance. Patient denies depression symptoms and does not want medications or further counseling. Lantus increased Home health RN for medication education -spoke with daughter   ESRD on hemodialysis: Dialysis per nephrology  Nausea & vomiting: Has resolved. Likely related to DKA. However, has had persistent pancreatic head fullness on imaging, and chronic common duct dilatation without definite cut off point. LFTs and lipase normal.  MRCP: 1. While there is some mild subjective fullness in the pancreatic head. No discrete mass is identified on today's noncontrast MRI/MRCP examination. 2. There is some mild enlargement of the common bile duct and common hepatic duct. However, there is no associated intrahepatic biliary ductal dilatation. Additionally, the gallbladder is not distended. There is no evidence of cholelithiasis or choledocholithiasis. The enlargement of the common bile duct and common hepatic duct is of uncertain etiology and significance, however, if there is some mild focal pancreatitis in the region of the pancreatic head this could be causing some mild compression of the distal common bile duct which could account for these findings. If for some reason there is strong clinical concern for an underlying mass in the pancreatic head, given that the patient cannot currently receive IV gadolinium, further evaluation with ERCP could be considered. 3. Iron deposition in the liver and  spleen. -suspect gastroparesis- added reglan may need outpatient gastric emtying study   Hypertension: Not well controlled. Will resume her home  antihypertensives.  Abnormal abdominal ultrasound and CAT scan last admission: See above.   Procedures:  Consultations:    Discharge Exam: Filed Vitals:   10/26/14 1417  BP: 120/66  Pulse: 73  Temp: 98 F (36.7 C)  Resp: 18    General: pleasant/cooperative Cardiovascular: rrr Respiratory: clear  Discharge Instructions   Discharge Instructions    Discharge instructions    Complete by:  As directed   Home health RN Renal/carb mod diet     Discharge instructions    Complete by:  As directed   Home health RN Monitor blood sugars and bring to PCP Renal/carb mod     Increase activity slowly    Complete by:  As directed      Increase activity slowly    Complete by:  As directed           Current Discharge Medication List    START taking these medications   Details  metoCLOPramide (REGLAN) 5 MG tablet Take 1 tablet (5 mg total) by mouth 4 (four) times daily -  before meals and at bedtime. Qty: 90 tablet, Refills: 0      CONTINUE these medications which have CHANGED   Details  insulin glargine (LANTUS) 100 UNIT/ML injection Inject 0.2 mLs (20 Units total) into the skin at bedtime. Qty: 10 mL, Refills: 11      CONTINUE these medications which have NOT CHANGED   Details  acetaminophen (TYLENOL) 500 MG tablet Take 500-1,000 mg by mouth every 6 (six) hours as needed for moderate pain.    albuterol (PROVENTIL HFA;VENTOLIN HFA) 108 (90 BASE) MCG/ACT inhaler Inhale 2 puffs into the lungs every 6 (six) hours as needed for wheezing or shortness of breath.     amLODipine (NORVASC) 10 MG tablet Take 10 mg by mouth daily.     aspirin EC 81 MG tablet Take 81 mg by mouth daily.    calcitRIOL (ROCALTROL) 0.25 MCG capsule Take 0.25 mcg by mouth daily.    calcium acetate (PHOSLO) 667 MG capsule Take 667 mg by mouth 3 (three) times daily with meals.    carvedilol (COREG) 25 MG tablet Take 25 mg by mouth 2 (two) times daily with a meal.    hydrALAZINE (APRESOLINE) 100  MG tablet Take 50 mg by mouth 3 (three) times daily.    insulin lispro (HUMALOG) 100 UNIT/ML injection Inject 3-10 Units into the skin 3 (three) times daily before meals. Sliding scale.  CBG 200: 3 UNITS, 250 5 UNITS, 300 7 UNITS, 350 9-10 UNITS    nitroGLYCERIN (NITROSTAT) 0.4 MG SL tablet Place 1 tablet (0.4 mg total) under the tongue every 5 (five) minutes as needed for chest pain. Qty: 30 tablet, Refills: 3    pantoprazole (PROTONIX) 40 MG tablet Take 1 tablet (40 mg total) by mouth 2 (two) times daily. Qty: 60 tablet, Refills: 1    polyethylene glycol (MIRALAX / GLYCOLAX) packet Take 17 g by mouth daily. Qty: 14 each, Refills: 0    promethazine (PHENERGAN) 25 MG tablet Take 25 mg by mouth 2 (two) times daily as needed for nausea or vomiting.     sevelamer carbonate (RENVELA) 800 MG tablet Take 800 mg by mouth 3 (three) times daily with meals.      STOP taking these medications     ferrous sulfate 325 (65 FE) MG tablet  levETIRAcetam (KEPPRA) 500 MG tablet      oxyCODONE (OXY IR/ROXICODONE) 5 MG immediate release tablet      traMADol (ULTRAM) 50 MG tablet        No Known Allergies Follow-up Information    Follow up with Pamelia Hoit, MD In 1 week.   Specialty:  Family Medicine   Contact information:   [address] [place] [address] [phone]        The results of significant diagnostics from this hospitalization (including imaging, microbiology, ancillary and laboratory) are listed below for reference.    Significant Diagnostic Studies: Dg Chest 2 View  10/23/2014   CLINICAL DATA:  Weakness.  COPD.  CHF.  EXAM: CHEST  2 VIEW  COMPARISON:  10/11/2014  FINDINGS: Normal heart size and pulmonary vascularity. No focal airspace disease or consolidation in the lungs. No blunting of costophrenic angles. No pneumothorax. Mediastinal contours appear intact. Calcification of the aorta. Previous resection or resorption of the distal left clavicle.   IMPRESSION: No active cardiopulmonary disease.   Electronically Signed   By: Burman Nieves M.D.   On: 10/23/2014 01:38   Dg Abd 1 View  10/23/2014   CLINICAL DATA:  Nausea and vomiting.  EXAM: ABDOMEN - 1 VIEW  COMPARISON:  CT 10/10/2014.  FINDINGS: Soft tissue structures are unremarkable. No bowel distention. No free air. Stool in colon. Stable abdominal pelvic phleboliths. No acute bony abnormality.  IMPRESSION: No acute abnormality.   Electronically Signed   By: Maisie Fus  Register   On: 10/23/2014 08:04   Ct Head Wo Contrast  10/23/2014   CLINICAL DATA:  Acute onset of generalized weakness and confusion. Initial encounter.  EXAM: CT HEAD WITHOUT CONTRAST  TECHNIQUE: Contiguous axial images were obtained from the base of the skull through the vertex without intravenous contrast.  COMPARISON:  CT of the head performed 02/06/2014  FINDINGS: There is no evidence of acute infarction, mass lesion, or intra- or extra-axial hemorrhage on CT.  Scattered periventricular white matter change likely reflects small vessel ischemic microangiopathy.  The posterior fossa, including the cerebellum, brainstem and fourth ventricle, is within normal limits. The third and lateral ventricles, and basal ganglia are unremarkable in appearance. The cerebral hemispheres are symmetric in appearance, with normal gray-white differentiation. No mass effect or midline shift is seen.  There is no evidence of fracture; visualized osseous structures are unremarkable in appearance. The visualized portions of the orbits are within normal limits. The paranasal sinuses and mastoid air cells are well-aerated. No significant soft tissue abnormalities are seen.  IMPRESSION: 1. No acute intracranial pathology seen on CT. 2. Mild small vessel ischemic microangiopathy.   Electronically Signed   By: Roanna Raider M.D.   On: 10/23/2014 03:04   Mr Brain Wo Contrast  10/23/2014   CLINICAL DATA:  Altered mental status.  Nausea and vomiting.  EXAM: MRI  HEAD WITHOUT CONTRAST  TECHNIQUE: Multiplanar, multiecho pulse sequences of the brain and surrounding structures were obtained without intravenous contrast.  COMPARISON:  10/23/2014 head CT  FINDINGS: Images are mildly degraded by motion artifact.  There is no evidence of acute infarct, intracranial hemorrhage, mass, midline shift, or extra-axial fluid collection. Ventricles and sulci are within normal limits for age. Periventricular white matter T2 hyperintensities are nonspecific but compatible with mild chronic small vessel ischemic disease.  Prior right cataract extraction is noted. Paranasal sinuses and mastoid air cells are clear. Major intracranial vascular flow voids are preserved.  IMPRESSION: 1. No acute intracranial abnormality.  2. Mild chronic small vessel ischemic disease.   Electronically Signed   By: Sebastian Ache   On: 10/23/2014 11:53   US Abdomen Complete  10/23/2014   CLINICAL DATA:  Nausea and vomiting  EXAM: ULTRASOUND ABDOMEN COMPLETE  COMPARISON:  CT abdomen/pelvis 10/10/2014  FINDINGS: Gallbladder: No gallstones or wall thickening visualized. No sonographic Murphy sign noted.  Common bile duct: Diameter: 1.1 cm  Liver: No focal lesion identified. Within normal limits in parenchymal echogenicity.  IVC: No abnormality visualized.  Pancreas: Prominence of the pancreatic head reidentified without peripancreatic fluid or ductal dilatation.  Spleen: Size and appearance within normal limits.  Right Kidney: Length: 9.3 cm. Increased renal cortical echogenicity without hydronephrosis or focal mass.  Left Kidney: Length: 9.0 cm. Increased renal cortical echogenicity without hydronephrosis or focal mass.  Abdominal aorta: No aneurysm visualized.  Other findings: None.  IMPRESSION: Pancreatic head fullness compatible with similar finding at the dissimilar recent prior exam. Correlation with amylase and lipase could be helpful to determine whether this represents pancreatitis or whether followup imaging  is indicated to exclude the presence of a mass.  Stable common duct dilatation without focal cut off, 1.1 cm maximally without visible filling defect. However, this constellation of findings could also be seen with gallstone pancreatitis and MRCP with contrast could be helpful if further evaluation for possible common duct filling defect is desired.  Echogenic renal parenchyma suggesting medical renal disease.   Electronically Signed   By: Christiana Pellant M.D.   On: 10/23/2014 11:00   Ct Abdomen Pelvis W Contrast  10/10/2014   CLINICAL DATA:  Severe abdominal pain for 1 week. Chronic kidney disease on dialysis.  EXAM: CT ABDOMEN AND PELVIS WITH CONTRAST  TECHNIQUE: Multidetector CT imaging of the abdomen and pelvis was performed using the standard protocol following bolus administration of intravenous contrast.  CONTRAST:  OMNIPAQUE IOHEXOL 300 MG/ML  SOLN  COMPARISON:  01/07/2014  FINDINGS: Lower Chest:  Unremarkable.  Hepatobiliary: No masses or other significant abnormality identified. Gallbladder is unremarkable.  Pancreas: Enlargement and heterogeneous attenuation is seen involving the pancreatic head and uncinate process, without evidence of a discrete mass. This is suspicious for mild acute pancreatitis. No evidence of pancreatic necrosis or peripancreatic fluid collections. No evidence of pancreatic ductal dilatation.  Spleen:  Within normal limits in size and appearance.  Adrenals:  No masses identified.  Kidneys/Urinary Tract:  No evidence of masses or hydronephrosis.  Stomach/Bowel/Peritoneum: A small hiatal hernia is seen and there is also wall thickening of the distal esophagus suspicious for reflux esophagitis. No evidence of bowel wall thickening or dilatation. Normal appendix visualized.  Vascular/Lymphatic: No pathologically enlarged lymph nodes identified. No other significant abnormality visualized.  Reproductive: Prior hysterectomy noted. Adnexal regions are unremarkable in appearance.   Other:  None.  Musculoskeletal:  No suspicious bone lesions identified.  IMPRESSION: Swelling of pancreatic head and uncinate process, without evidence of discrete mass or pancreatic ductal dilatation. This is suspicious for focal pancreatitis. Recommend clinical correlation with serum amylase and lipase levels.  Small hiatal hernia, with distal esophageal wall thickening suspicious for reflux esophagitis. Consider endoscopy for further evaluation.   Electronically Signed   By: Myles Rosenthal M.D.   On: 10/10/2014 17:23   Mr Abdomen Mrcp Wo Cm  10/26/2014   CLINICAL DATA:  59 year old female with history of diabetes and end-stage renal disease, recently admitted for severe hyperglycemia. Abnormal pancreas on CT scan. Currently admitted with encephalopathy and diabetic ketoacidosis.  EXAM: MRI ABDOMEN WITHOUT  CONTRAST  (INCLUDING MRCP)  TECHNIQUE: Multiplanar multisequence MR imaging of the abdomen was performed. Heavily T2-weighted images of the biliary and pancreatic ducts were obtained, and three-dimensional MRCP images were rendered by post processing.  COMPARISON:  CT of the abdomen and pelvis 10/10/2014. Abdominal ultrasound 10/23/2014.  FINDINGS: Lower chest:  Mild cardiomegaly.  Otherwise, unremarkable.  Hepatobiliary: Diffuse low signal intensity throughout the hepatic parenchyma on T2 weighted images, suggestive of iron deposition. No discrete cystic or solid hepatic lesions are confidently identified on today's noncontrast MRI examination. MRCP demonstrates no intrahepatic biliary ductal dilatation. Gallbladder is nearly completely decompressed. Common hepatic duct is slightly dilated measuring up to 9 x 11 mm (mean diameter of 10 mm). Common bile duct also mildly dilated measuring 9 mm in diameter in the porta hepatis. No filling defect within the ducts to suggest the presence of retained ductal stones. The common bile duct abruptly tapers in the region of the pancreatic head.  Pancreas: While there is  some mild subjective fullness of the pancreatic head on today's examination, there is no discrete mass identified. Diffusion-weighted images demonstrate node unusual diffusion restriction in the region of the pancreatic head. The body and tail of the pancreas are grossly normal in appearance. Specifically, MRCP images demonstrate no pancreatic ductal dilatation.  Spleen: Low signal intensity throughout the spleen on T2 weighted images may also suggest a mild iron deposition.  Adrenals/Urinary Tract: The unenhanced appearance of the kidneys and adrenal glands bilaterally is normal.  Stomach/Bowel: Visualized portions are unremarkable.  Vascular/Lymphatic: No definite aneurysm identified in the visualized abdominal vasculature on today's noncontrast MRI examination.  Other: No significant volume of ascites noted in the visualized portions of the peritoneal cavity.  Musculoskeletal: No aggressive osseous lesions noted in the visualized portions of the skeleton.  IMPRESSION: 1. While there is some mild subjective fullness in the pancreatic head. No discrete mass is identified on today's noncontrast MRI/MRCP examination. 2. There is some mild enlargement of the common bile duct and common hepatic duct. However, there is no associated intrahepatic biliary ductal dilatation. Additionally, the gallbladder is not distended. There is no evidence of cholelithiasis or choledocholithiasis. The enlargement of the common bile duct and common hepatic duct is of uncertain etiology and significance, however, if there is some mild focal pancreatitis in the region of the pancreatic head this could be causing some mild compression of the distal common bile duct which could account for these findings. If for some reason there is strong clinical concern for an underlying mass in the pancreatic head, given that the patient cannot currently receive IV gadolinium, further evaluation with ERCP could be considered. 3. Iron deposition in the  liver and spleen.   Electronically Signed   By: Trudie Reed M.D.   On: 10/26/2014 08:46   Dg Chest Port 1 View  10/11/2014   CLINICAL DATA:  Central line placement.  EXAM: PORTABLE CHEST - 1 VIEW  COMPARISON:  Same day.  FINDINGS: Stable cardiomediastinal silhouette. No pneumothorax or significant pleural effusion is noted. No acute pulmonary disease is noted. Interval placement of right internal jugular catheter line with distal tip at the expected position of the cavoatrial junction. Bony thorax is intact.  IMPRESSION: Interval placement of right internal jugular catheter line with distal tip at expected position of cavoatrial junction. No pneumothorax is noted.   Electronically Signed   By: Lupita Raider, M.D.   On: 10/11/2014 13:25   Dg Chest Port 1 View  10/11/2014   CLINICAL  DATA:  Fevers  EXAM: PORTABLE CHEST - 1 VIEW  COMPARISON:  01/07/2014  FINDINGS: The heart size and mediastinal contours are within normal limits. Both lungs are clear. The visualized skeletal structures are unremarkable.  IMPRESSION: No active disease.   Electronically Signed   By: Alcide Clever M.D.   On: 10/11/2014 07:07    Microbiology: Recent Results (from the past 240 hour(s))  MRSA PCR Screening     Status: None   Collection Time: 10/23/14  4:43 AM  Result Value Ref Range Status   MRSA by PCR NEGATIVE NEGATIVE Final    Comment:        The GeneXpert MRSA Assay (FDA approved for NASAL specimens only), is one component of a comprehensive MRSA colonization surveillance program. It is not intended to diagnose MRSA infection nor to guide or monitor treatment for MRSA infections.      Labs: Basic Metabolic Panel:  Recent Labs Lab 10/22/14 2336 10/23/14 0535 10/23/14 1344 10/24/14 0249 10/26/14 1000  NA 131*  --  144 133* 135  K 4.1  --  3.8 5.4* 3.9  CL 91*  --  104 98* 101  CO2 20*  --  30 21* 26  GLUCOSE 819*  --  98 415* 89  BUN 19  --  22* 11 27*  CREATININE 3.34* 3.29* 3.50* 2.00* 3.29*   CALCIUM 9.1  --  9.4 9.0 9.2  PHOS  --   --   --   --  2.5   Liver Function Tests:  Recent Labs Lab 10/22/14 2336 10/23/14 0535 10/26/14 1000  AST 32 22  --   ALT 21 19  --   ALKPHOS 199* 180*  --   BILITOT 1.3* 0.9  --   PROT 6.6 6.4*  --   ALBUMIN 3.1* 3.1* 2.6*    Recent Labs Lab 10/23/14 0535  LIPASE 34    Recent Labs Lab 10/23/14 0535  AMMONIA 18   CBC:  Recent Labs Lab 10/22/14 2336 10/23/14 0535 10/24/14 1019 10/26/14 1000  WBC 7.9 8.1  --  6.3  NEUTROABS 6.4  --   --   --   HGB 12.2 11.9*  --  11.6*  HCT 36.4 36.1 33.1* 35.4*  MCV 94.8 94.5  --  94.4  PLT 260 209  --  142*   Cardiac Enzymes:  Recent Labs Lab 10/23/14 0535  TROPONINI <0.03   BNP: BNP (last 3 results) No results for input(s): BNP in the last 8760 hours.  ProBNP (last 3 results) No results for input(s): PROBNP in the last 8760 hours.  CBG:  Recent Labs Lab 10/25/14 1119 10/25/14 1640 10/25/14 2218 10/26/14 0808 10/26/14 1413  GLUCAP 235* 332* 137* 64* 188*       Signed:  Marce Schartz  [facility] 10/26/2014, 3:20 PM

## 2014-12-17 ENCOUNTER — Encounter: Payer: Self-pay | Admitting: *Deleted

## 2014-12-18 ENCOUNTER — Encounter: Payer: Self-pay | Admitting: Diagnostic Neuroimaging

## 2014-12-18 ENCOUNTER — Ambulatory Visit (INDEPENDENT_AMBULATORY_CARE_PROVIDER_SITE_OTHER): Payer: Medicaid Other | Admitting: Diagnostic Neuroimaging

## 2014-12-18 VITALS — BP 137/66 | HR 60 | Ht <= 58 in | Wt 144.0 lb

## 2014-12-18 DIAGNOSIS — G92 Toxic encephalopathy: Secondary | ICD-10-CM

## 2014-12-18 DIAGNOSIS — R278 Other lack of coordination: Secondary | ICD-10-CM

## 2014-12-18 DIAGNOSIS — G928 Other toxic encephalopathy: Secondary | ICD-10-CM

## 2014-12-18 DIAGNOSIS — R279 Unspecified lack of coordination: Secondary | ICD-10-CM

## 2014-12-18 DIAGNOSIS — G253 Myoclonus: Secondary | ICD-10-CM | POA: Diagnosis not present

## 2014-12-18 NOTE — Patient Instructions (Signed)
Follow up with Dr. Wilson

## 2014-12-18 NOTE — Progress Notes (Signed)
[facility]  PATIENT: Madeline [name] DOB: 1955/07/10  REFERRING CLINICIAN: Wilson HISTORY FROM: patient and daughter  REASON FOR VISIT: new consult    HISTORICAL  CHIEF COMPLAINT:  Chief Complaint  Patient presents with  . Tremors    rm 6, New patient, daughter - Lazaro Arms, "tremors are much better, pain in legs"    HISTORY OF PRESENT ILLNESS:   59 year old female here with hypertension, diabetes, hypercholesterolemia, coronary artery disease, CHF, COPD, obstructive sleep apnea, pulmonary hypertension, here for evaluation of tremors.  May 2016 patient had onset of intermittent muscle jerks and spasms, as well as intermittent loss of muscle tone, dropping things, falling down. Patient was admitted to the hospital June 1-7, 2016 (diagnosed with toxic metabolic encephalopathy, abdominal pain, hospital pancreatitis, hyperglycemia, fever and hypotension; hospital records reviewed and summarized). During that hospital stay patient was also noted to have continuation of muscle spasms inject. Neurology consult was obtained. Patient was diagnosed with asterixis related to toxic metabolic etiologies. Patient was treated with levetiracetam during hospital course and tremors seem to resolve.  Patient was readmitted June 13-17, 2016 (Diagnosed with acute encephalopathy, nausea, vomiting, gastroparesis, elevated blood sugars up to 800). Abnormal movements and tremors were not noted on this admission. After discharge patient went home and has been doing better. Tremors have essentially resolved. She still has some intermittent pain in her legs. Levetiracetam was discontinued.    REVIEW OF SYSTEMS: Full 14 system review of systems performed and notable only for insomnia restless legs tremor aching muscles.  ALLERGIES: No Known Allergies  HOME MEDICATIONS: Outpatient Prescriptions Prior to Visit  Medication Sig Dispense Refill  . acetaminophen (TYLENOL) 500 MG tablet Take  500-1,000 mg by mouth every 6 (six) hours as needed for moderate pain.    Marland Kitchen albuterol (PROVENTIL HFA;VENTOLIN HFA) 108 (90 BASE) MCG/ACT inhaler Inhale 2 puffs into the lungs every 6 (six) hours as needed for wheezing or shortness of breath.     Marland Kitchen amLODipine (NORVASC) 10 MG tablet Take 10 mg by mouth daily.     Marland Kitchen aspirin EC 81 MG tablet Take 81 mg by mouth daily.    . calcitRIOL (ROCALTROL) 0.25 MCG capsule Take 0.25 mcg by mouth daily.    . calcium acetate (PHOSLO) 667 MG capsule Take 667 mg by mouth 3 (three) times daily with meals.    . carvedilol (COREG) 25 MG tablet Take 25 mg by mouth 2 (two) times daily with a meal.    . insulin lispro (HUMALOG) 100 UNIT/ML injection Inject 3-10 Units into the skin 3 (three) times daily before meals. Sliding scale.  CBG 200: 3 UNITS, 250 5 UNITS, 300 7 UNITS, 350 9-10 UNITS    . metoCLOPramide (REGLAN) 5 MG tablet Take 1 tablet (5 mg total) by mouth 4 (four) times daily -  before meals and at bedtime. 90 tablet 0  . nitroGLYCERIN (NITROSTAT) 0.4 MG SL tablet Place 1 tablet (0.4 mg total) under the tongue every 5 (five) minutes as needed for chest pain. 30 tablet 3  . pantoprazole (PROTONIX) 40 MG tablet Take 1 tablet (40 mg total) by mouth 2 (two) times daily. 60 tablet 1  . polyethylene glycol (MIRALAX / GLYCOLAX) packet Take 17 g by mouth daily. 14 each 0  . promethazine (PHENERGAN) 25 MG tablet Take 25 mg by mouth 2 (two) times daily as needed for nausea or vomiting.     . sevelamer carbonate (RENVELA) 800 MG tablet Take 800 mg by mouth 3 (three)  times daily with meals.    . hydrALAZINE (APRESOLINE) 100 MG tablet Take 50 mg by mouth 3 (three) times daily.    . insulin glargine (LANTUS) 100 UNIT/ML injection Inject 0.2 mLs (20 Units total) into the skin at bedtime. 10 mL 11   No facility-administered medications prior to visit.    PAST MEDICAL HISTORY: Past Medical History  Diagnosis Date  . CHF (congestive heart failure)     a. HFpEF with  RHF/anasarca/pHTN.  . COPD (chronic obstructive pulmonary disease)   . Coronary artery disease     a. Per [place] records: NSTEMI 01/2012, tx medically given ARF but suspected CAD. b. Stress test 12/16/11 reported w/o ischemia.  . Hypertension   . Diabetes mellitus   . OSA (obstructive sleep apnea)     a. Pt reported used to use CPAP in [place], but "ran out" when came to [facility]  Marland Kitchen CKD (chronic kidney disease), stage III     a. Per [place] records: h/o ARF after CTA that ruled out PE.  Marland Kitchen Pulmonary hypertension     a. RHC 02/28/13: mod pulm HTN with normal PVR suggestive of predominantly pulmonary venous HTN.  . Right heart failure   . Anasarca     a. Per [place] records - due to pulm HTN with R HF.   Marland Kitchen Aseptic meningitis     a. 09/2012: adm in [place] for metabolic encephalopathy, oliguric tubular necrosis, anemia, HTN, possible CVA, HHNKA.  Marland Kitchen CVA (cerebral infarction)     a. Per [place] records, "possible CVA" 09/2012 but MRI reportedly negative.  . Abnormal Doppler ultrasound of carotid artery     a. Per [place] records: <50% LICA.  Marland Kitchen Renal insufficiency     dialysis 3 days/week  . Dialysis patient     [facility], Fridays  . Stroke 2014    PAST SURGICAL HISTORY: Past Surgical History  Procedure Laterality Date  . Tubal ligation    . Abdominal hysterectomy    . Esophagogastroduodenoscopy N/A 02/16/2013    Procedure: ESOPHAGOGASTRODUODENOSCOPY (EGD);  Surgeon: Graylin Shiver, MD;  Location[facility] ENDOSCOPY;  Service: Endoscopy;  Laterality: N/A;  . Insertion of dialysis catheter Right 03/03/2013    Procedure: INSERTION OF DIALYSIS CATHETER;  Surgeon: Larina Earthly, MD;  Location: [facility] OR;  Service: Vascular;  Laterality: Right;  Right Internal Jugular Placement  . Av fistula placement Left 03/03/2013    Procedure: ARTERIOVENOUS (AV) FISTULA CREATION, Brachial/Cephalic;  Surgeon: Larina Earthly, MD;  Location: [facility] OR;  Service: Vascular;  Laterality: Left;  . Right heart catheterization N/A 02/28/2013    Procedure: RIGHT HEART CATH;   Surgeon: Dolores Patty, MD;  Location: [facility] CATH LAB;  Service: Cardiovascular;  Laterality: N/A;  . Shuntogram N/A 07/13/2013    Procedure: FISTULOGRAM;  Surgeon: Fransisco Hertz, MD;  Location: [facility] CATH LAB;  Service: Cardiovascular;  Laterality: N/A;    FAMILY HISTORY: Family History  Problem Relation Age of Onset  . Diabetes Mellitus II Sister   . Diabetes Mellitus II Brother   . CAD Brother     SOCIAL HISTORY:  History   Social History  . Marital Status: Widowed    Spouse Name: N/A  . Number of Children: 4  . Years of Education: 9   Occupational History  .      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 Smoking status: Former Games developer  . Smokeless tobacco: Current User    Types: Snuff, Chew  Comment: quit many years ago  . Alcohol Use: No  . Drug Use: No  . Sexual Activity: No   Other Topics Concern  . Not on file   Social History Narrative   Lives at home with sister   Caffeine use- none     PHYSICAL EXAM  GENERAL EXAM/CONSTITUTIONAL: Vitals:  Filed Vitals:   12/18/14 0927  BP: 137/66  Pulse: 60  Height: 4\' 9"  (1.448 m)  Weight: 144 lb (65.318 kg)     Body mass index is 31.15 kg/(m^2).  Visual Acuity Screening   Right eye Left eye Both eyes  Without correction:  20/100   With correction:     Comments: 12/18/14 states she cannot see any letters with right eye, does not have prescription lenses, states cannot afford    Patient is in no distress; well developed, nourished and groomed; neck is supple  CARDIOVASCULAR:  Examination of carotid arteries is normal; no carotid bruits  Regular rate and rhythm; BLOWING HOLOSYSTOLIC MURMUR  Examination of peripheral vascular system by observation and palpation is normal; LEFT BRACHIAL DIALYSIS ACCESS WITH PALPABLE THRILL  EYES:  Ophthalmoscopic exam of optic discs and posterior segments is normal; no papilledema or hemorrhages; LEFT EYE CATARACT  MUSCULOSKELETAL:  Gait, strength,  tone, movements noted in Neurologic exam below  NEUROLOGIC: MENTAL STATUS:  No flowsheet data found.  awake, alert, oriented to person, place and time  recent and remote memory intact  normal attention and concentration  language DECR FLUENCY; comprehension intact, naming intact,   fund of knowledge appropriate  SOFT SPOKEN  CRANIAL NERVE:   2nd - no papilledema on fundoscopic exam  2nd, 3rd, 4th, 6th - pupils equal and reactive to light, visual fields full to confrontation, extraocular muscles intact, no nystagmus  5th - facial sensation symmetric  7th - facial strength symmetric  8th - hearing intact  9th - palate elevates symmetrically, uvula midline  11th - shoulder shrug symmetric  12th - tongue protrusion midline  MOTOR:   GENERALIZED PSYCHOMOTOR SLOWING; normal bulk and tone, full strength in the BUE, BLE; NO TREMOR OR ASTERIXIS  SENSORY:   normal and symmetric to light touch, temperature, vibration  COORDINATION:   finger-nose-finger, fine finger movements normal  REFLEXES:   deep tendon reflexes present and symmetric  GAIT/STATION:   narrow based gait; USES ROLLATOR WALKER     DIAGNOSTIC DATA (LABS, IMAGING, TESTING) - I reviewed patient records, labs, notes, testing and imaging myself where available.  Lab Results  Component Value Date   WBC 6.3 10/26/2014   HGB 11.6* 10/26/2014   HCT 35.4* 10/26/2014   MCV 94.4 10/26/2014   PLT 142* 10/26/2014      Component Value Date/Time   NA 135 10/26/2014 1000   K 3.9 10/26/2014 1000   CL 101 10/26/2014 1000   CO2 26 10/26/2014 1000   GLUCOSE 89 10/26/2014 1000   BUN 27* 10/26/2014 1000   CREATININE 3.29* 10/26/2014 1000   CALCIUM 9.2 10/26/2014 1000   CALCIUM 8.6 12/12/2013 0920   PROT 6.4* 10/23/2014 0535   ALBUMIN 2.6* 10/26/2014 1000   AST 22 10/23/2014 0535   ALT 19 10/23/2014 0535   ALKPHOS 180* 10/23/2014 0535   BILITOT 0.9 10/23/2014 0535   GFRNONAA 15* 10/26/2014 1000    GFRAA 17* 10/26/2014 1000   No results found for: CHOL, HDL, LDLCALC, LDLDIRECT, TRIG, CHOLHDL Lab Results  Component Value Date   HGBA1C 15.5* 10/13/2014   Lab Results  Component Value Date  [id number] 1419* 10/24/2014   Lab Results  Component Value Date   TSH 0.316* 10/23/2014    10/23/14 MRI BRAIN [I reviewed images myself and agree with interpretation. -VRP]  1. No acute intracranial abnormality. 2. Mild chronic small vessel ischemic disease.    ASSESSMENT AND PLAN  59 y.o. year old female here with significant vascular risk factors include hypertension, diabetes, hypercholesteremia, heart disease, congestive heart failure, coronary artery disease, end-stage renal disease on hemodialysis, with 2 admissions for toxic metabolic encephalopathy. Abnormal movements were likely related to toxic metabolic etiologies. With improvement in metabolic status, the abnormal movements have resolved.   Dx: toxic-metabolic encephalopathy (due to hyperglycemia, infection, renal failure, pancreatitis) with asterixis and myoclonus --> now resolved  PLAN: - no further neurologic recommendations; treat underlying metabolic and medical issues  Return if symptoms worsen or fail to improve, for return to PCP.    Suanne Marker, MD 12/18/2014, 10:21 AM Certified in Neurology, Neurophysiology and Neuroimaging  [facility] [address] [place], [place] [address] [phone]

## 2015-07-09 ENCOUNTER — Other Ambulatory Visit (HOSPITAL_COMMUNITY): Payer: Self-pay | Admitting: Internal Medicine

## 2016-02-04 ENCOUNTER — Other Ambulatory Visit: Payer: Medicaid Other

## 2016-02-04 DIAGNOSIS — B1710 Acute hepatitis C without hepatic coma: Secondary | ICD-10-CM

## 2016-02-04 LAB — CBC WITH DIFFERENTIAL/PLATELET
Basophils Absolute: 0 cells/uL (ref 0–200)
Basophils Relative: 0 %
EOS ABS: 41 {cells}/uL (ref 15–500)
Eosinophils Relative: 1 %
HCT: 36.3 % (ref 35.0–45.0)
Hemoglobin: 11.2 g/dL — ABNORMAL LOW (ref 11.7–15.5)
Lymphocytes Relative: 42 %
Lymphs Abs: 1722 cells/uL (ref 850–3900)
MCH: 29.2 pg (ref 27.0–33.0)
MCHC: 30.9 g/dL — ABNORMAL LOW (ref 32.0–36.0)
MCV: 94.5 fL (ref 80.0–100.0)
MONOS PCT: 9 %
MPV: 11.1 fL (ref 7.5–12.5)
Monocytes Absolute: 369 cells/uL (ref 200–950)
NEUTROS ABS: 1968 {cells}/uL (ref 1500–7800)
Neutrophils Relative %: 48 %
PLATELETS: 129 10*3/uL — AB (ref 140–400)
RBC: 3.84 MIL/uL (ref 3.80–5.10)
RDW: 14.4 % (ref 11.0–15.0)
WBC: 4.1 10*3/uL (ref 3.8–10.8)

## 2016-02-04 LAB — COMPREHENSIVE METABOLIC PANEL
ALBUMIN: 3.5 g/dL — AB (ref 3.6–5.1)
ALT: 17 U/L (ref 6–29)
AST: 28 U/L (ref 10–35)
Alkaline Phosphatase: 219 U/L — ABNORMAL HIGH (ref 33–130)
BUN: 9 mg/dL (ref 7–25)
CO2: 25 mmol/L (ref 20–31)
Calcium: 9.2 mg/dL (ref 8.6–10.4)
Chloride: 96 mmol/L — ABNORMAL LOW (ref 98–110)
Creat: 3.12 mg/dL — ABNORMAL HIGH (ref 0.50–1.05)
Glucose, Bld: 649 mg/dL (ref 65–99)
Potassium: 4.4 mmol/L (ref 3.5–5.3)
Sodium: 133 mmol/L — ABNORMAL LOW (ref 135–146)
TOTAL PROTEIN: 6.4 g/dL (ref 6.1–8.1)
Total Bilirubin: 0.4 mg/dL (ref 0.2–1.2)

## 2016-02-05 ENCOUNTER — Telehealth: Payer: Self-pay | Admitting: *Deleted

## 2016-02-05 LAB — PROTIME-INR
INR: 1.1
PROTHROMBIN TIME: 11.4 s (ref 9.0–11.5)

## 2016-02-05 LAB — HEPATITIS A ANTIBODY, TOTAL: Hep A Total Ab: NONREACTIVE

## 2016-02-05 LAB — HEPATITIS B SURFACE ANTIGEN: Hepatitis B Surface Ag: NEGATIVE

## 2016-02-05 LAB — HEPATITIS B CORE ANTIBODY, TOTAL: Hep B Core Total Ab: NONREACTIVE

## 2016-02-05 LAB — HEPATITIS B SURFACE ANTIBODY,QUALITATIVE: Hep B S Ab: POSITIVE — AB

## 2016-02-05 LAB — AFP TUMOR MARKER: AFP-Tumor Marker: 9.7 ng/mL — ABNORMAL HIGH (ref <6.1)

## 2016-02-05 LAB — HIV ANTIBODY (ROUTINE TESTING W REFLEX): HIV 1&2 Ab, 4th Generation: NONREACTIVE

## 2016-02-05 NOTE — Telephone Encounter (Signed)
[facility] lab with a critical glucose on patient of 649. Notified patient's daughter, Hilda LiasMarie (emergency contact) and she stated she does usually run high, but not usually that high. Advised her this is a critical high and her PCP should be notified. She said she would contact Dr. Andrey CampanileWilson.

## 2016-02-06 LAB — LIVER FIBROSIS, FIBROTEST-ACTITEST
ALPHA-2-MACROGLOBULIN: 489 mg/dL — AB (ref 106–279)
ALT: 18 U/L (ref 6–29)
Apolipoprotein A1: 211 mg/dL — ABNORMAL HIGH (ref 101–198)
BILIRUBIN: 0.4 mg/dL (ref 0.2–1.2)
FIBROSIS SCORE: 0.74
GGT: 171 U/L — AB (ref 3–70)
Haptoglobin: 33 mg/dL — ABNORMAL LOW (ref 43–212)
NECROINFLAMMAT ACT SCORE: 0.12
Reference ID: 1641937

## 2016-02-07 ENCOUNTER — Emergency Department (HOSPITAL_COMMUNITY): Payer: Medicaid Other

## 2016-02-07 ENCOUNTER — Inpatient Hospital Stay (HOSPITAL_COMMUNITY)
Admission: EM | Admit: 2016-02-07 | Discharge: 2016-02-12 | DRG: 438 | Disposition: A | Discharge status: Home / Self Care | Payer: Medicaid Other | Attending: Internal Medicine | Admitting: Internal Medicine

## 2016-02-07 ENCOUNTER — Encounter (HOSPITAL_COMMUNITY): Payer: Self-pay | Admitting: Emergency Medicine

## 2016-02-07 DIAGNOSIS — I2510 Atherosclerotic heart disease of native coronary artery without angina pectoris: Secondary | ICD-10-CM | POA: Diagnosis present

## 2016-02-07 DIAGNOSIS — N186 End stage renal disease: Secondary | ICD-10-CM | POA: Diagnosis present

## 2016-02-07 DIAGNOSIS — G9341 Metabolic encephalopathy: Secondary | ICD-10-CM | POA: Diagnosis present

## 2016-02-07 DIAGNOSIS — J449 Chronic obstructive pulmonary disease, unspecified: Secondary | ICD-10-CM | POA: Diagnosis present

## 2016-02-07 DIAGNOSIS — Z9115 Patient's noncompliance with renal dialysis: Secondary | ICD-10-CM

## 2016-02-07 DIAGNOSIS — E1110 Type 2 diabetes mellitus with ketoacidosis without coma: Secondary | ICD-10-CM | POA: Diagnosis present

## 2016-02-07 DIAGNOSIS — Z90710 Acquired absence of both cervix and uterus: Secondary | ICD-10-CM

## 2016-02-07 DIAGNOSIS — Z9119 Patient's noncompliance with other medical treatment and regimen: Secondary | ICD-10-CM | POA: Diagnosis not present

## 2016-02-07 DIAGNOSIS — N185 Chronic kidney disease, stage 5: Secondary | ICD-10-CM | POA: Diagnosis not present

## 2016-02-07 DIAGNOSIS — D631 Anemia in chronic kidney disease: Secondary | ICD-10-CM | POA: Diagnosis present

## 2016-02-07 DIAGNOSIS — Z8673 Personal history of transient ischemic attack (TIA), and cerebral infarction without residual deficits: Secondary | ICD-10-CM

## 2016-02-07 DIAGNOSIS — Z91128 Patient's intentional underdosing of medication regimen for other reason: Secondary | ICD-10-CM

## 2016-02-07 DIAGNOSIS — E1165 Type 2 diabetes mellitus with hyperglycemia: Secondary | ICD-10-CM | POA: Diagnosis present

## 2016-02-07 DIAGNOSIS — K859 Acute pancreatitis without necrosis or infection, unspecified: Secondary | ICD-10-CM | POA: Diagnosis present

## 2016-02-07 DIAGNOSIS — I132 Hypertensive heart and chronic kidney disease with heart failure and with stage 5 chronic kidney disease, or end stage renal disease: Secondary | ICD-10-CM | POA: Diagnosis present

## 2016-02-07 DIAGNOSIS — R1110 Vomiting, unspecified: Secondary | ICD-10-CM

## 2016-02-07 DIAGNOSIS — Z8661 Personal history of infections of the central nervous system: Secondary | ICD-10-CM | POA: Diagnosis not present

## 2016-02-07 DIAGNOSIS — N2581 Secondary hyperparathyroidism of renal origin: Secondary | ICD-10-CM | POA: Diagnosis present

## 2016-02-07 DIAGNOSIS — K219 Gastro-esophageal reflux disease without esophagitis: Secondary | ICD-10-CM | POA: Diagnosis not present

## 2016-02-07 DIAGNOSIS — N390 Urinary tract infection, site not specified: Secondary | ICD-10-CM | POA: Diagnosis present

## 2016-02-07 DIAGNOSIS — E1122 Type 2 diabetes mellitus with diabetic chronic kidney disease: Secondary | ICD-10-CM | POA: Diagnosis present

## 2016-02-07 DIAGNOSIS — Z7982 Long term (current) use of aspirin: Secondary | ICD-10-CM

## 2016-02-07 DIAGNOSIS — R269 Unspecified abnormalities of gait and mobility: Secondary | ICD-10-CM

## 2016-02-07 DIAGNOSIS — T383X6A Underdosing of insulin and oral hypoglycemic [antidiabetic] drugs, initial encounter: Secondary | ICD-10-CM | POA: Diagnosis present

## 2016-02-07 DIAGNOSIS — Z992 Dependence on renal dialysis: Secondary | ICD-10-CM

## 2016-02-07 DIAGNOSIS — IMO0002 Reserved for concepts with insufficient information to code with codable children: Secondary | ICD-10-CM | POA: Diagnosis present

## 2016-02-07 DIAGNOSIS — R1115 Cyclical vomiting syndrome unrelated to migraine: Secondary | ICD-10-CM | POA: Diagnosis present

## 2016-02-07 DIAGNOSIS — Z8249 Family history of ischemic heart disease and other diseases of the circulatory system: Secondary | ICD-10-CM | POA: Diagnosis not present

## 2016-02-07 DIAGNOSIS — I252 Old myocardial infarction: Secondary | ICD-10-CM | POA: Diagnosis not present

## 2016-02-07 DIAGNOSIS — Z794 Long term (current) use of insulin: Secondary | ICD-10-CM

## 2016-02-07 DIAGNOSIS — F17290 Nicotine dependence, other tobacco product, uncomplicated: Secondary | ICD-10-CM | POA: Diagnosis present

## 2016-02-07 DIAGNOSIS — Z833 Family history of diabetes mellitus: Secondary | ICD-10-CM

## 2016-02-07 DIAGNOSIS — Y92019 Unspecified place in single-family (private) house as the place of occurrence of the external cause: Secondary | ICD-10-CM | POA: Diagnosis not present

## 2016-02-07 DIAGNOSIS — N189 Chronic kidney disease, unspecified: Secondary | ICD-10-CM

## 2016-02-07 DIAGNOSIS — E1100 Type 2 diabetes mellitus with hyperosmolarity without nonketotic hyperglycemic-hyperosmolar coma (NKHHC): Secondary | ICD-10-CM | POA: Diagnosis not present

## 2016-02-07 DIAGNOSIS — F17220 Nicotine dependence, chewing tobacco, uncomplicated: Secondary | ICD-10-CM | POA: Diagnosis present

## 2016-02-07 DIAGNOSIS — I5032 Chronic diastolic (congestive) heart failure: Secondary | ICD-10-CM | POA: Diagnosis present

## 2016-02-07 DIAGNOSIS — R4182 Altered mental status, unspecified: Secondary | ICD-10-CM

## 2016-02-07 DIAGNOSIS — E0810 Diabetes mellitus due to underlying condition with ketoacidosis without coma: Secondary | ICD-10-CM | POA: Diagnosis not present

## 2016-02-07 DIAGNOSIS — I272 Pulmonary hypertension, unspecified: Secondary | ICD-10-CM | POA: Diagnosis present

## 2016-02-07 DIAGNOSIS — N19 Unspecified kidney failure: Secondary | ICD-10-CM

## 2016-02-07 DIAGNOSIS — R0602 Shortness of breath: Secondary | ICD-10-CM

## 2016-02-07 LAB — URINALYSIS, ROUTINE W REFLEX MICROSCOPIC
Bilirubin Urine: NEGATIVE
Glucose, UA: >1000 mg/dL — AB
KETONES UR: 15 mg/dL — AB
NITRITE: NEGATIVE
PROTEIN: 100 mg/dL — AB
Specific Gravity, Urine: 1.021 (ref 1.005–1.030)
pH: 7.5 (ref 5.0–8.0)

## 2016-02-07 LAB — CBC WITH DIFFERENTIAL/PLATELET
Basophils Absolute: 0 10*3/uL (ref 0.0–0.1)
Basophils Relative: 0 %
Eosinophils Absolute: 0 10*3/uL (ref 0.0–0.7)
Eosinophils Relative: 0 %
HCT: 38.5 % (ref 36.0–46.0)
Hemoglobin: 12.2 g/dL (ref 12.0–15.0)
Lymphocytes Relative: 17 %
Lymphs Abs: 1 10*3/uL (ref 0.7–4.0)
MCH: 29.7 pg (ref 26.0–34.0)
MCHC: 31.7 g/dL (ref 30.0–36.0)
MCV: 93.7 fL (ref 78.0–100.0)
Monocytes Absolute: 0.2 10*3/uL (ref 0.1–1.0)
Monocytes Relative: 3 %
Neutro Abs: 4.5 10*3/uL (ref 1.7–7.7)
Neutrophils Relative %: 80 %
Platelets: 129 10*3/uL — ABNORMAL LOW (ref 150–400)
RBC: 4.11 MIL/uL (ref 3.87–5.11)
RDW: 14.4 % (ref 11.5–15.5)
WBC: 5.6 10*3/uL (ref 4.0–10.5)

## 2016-02-07 LAB — RENAL FUNCTION PANEL
Albumin: 3.4 g/dL — ABNORMAL LOW (ref 3.5–5.0)
Anion gap: 17 — ABNORMAL HIGH (ref 5–15)
BUN: 24 mg/dL — ABNORMAL HIGH (ref 6–20)
CO2: 19 mmol/L — ABNORMAL LOW (ref 22–32)
Calcium: 9.3 mg/dL (ref 8.9–10.3)
Chloride: 100 mmol/L — ABNORMAL LOW (ref 101–111)
Creatinine, Ser: 4.33 mg/dL — ABNORMAL HIGH (ref 0.44–1.00)
GFR calc Af Amer: 12 mL/min — ABNORMAL LOW (ref >60)
GFR calc non Af Amer: 10 mL/min — ABNORMAL LOW (ref >60)
Glucose, Bld: 566 mg/dL (ref 65–99)
Phosphorus: 4.5 mg/dL (ref 2.5–4.6)
Potassium: 4.9 mmol/L (ref 3.5–5.1)
Sodium: 136 mmol/L (ref 135–145)

## 2016-02-07 LAB — COMPREHENSIVE METABOLIC PANEL
ALBUMIN: 3.7 g/dL (ref 3.5–5.0)
ALT: 22 U/L (ref 14–54)
ANION GAP: 16 — AB (ref 5–15)
AST: 35 U/L (ref 15–41)
Alkaline Phosphatase: 181 U/L — ABNORMAL HIGH (ref 38–126)
BUN: 19 mg/dL (ref 6–20)
CO2: 20 mmol/L — AB (ref 22–32)
Calcium: 9.5 mg/dL (ref 8.9–10.3)
Chloride: 99 mmol/L — ABNORMAL LOW (ref 101–111)
Creatinine, Ser: 4.24 mg/dL — ABNORMAL HIGH (ref 0.44–1.00)
GFR calc Af Amer: 12 mL/min — ABNORMAL LOW (ref >60)
GFR calc non Af Amer: 11 mL/min — ABNORMAL LOW (ref >60)
GLUCOSE: 484 mg/dL — AB (ref 65–99)
POTASSIUM: 4.3 mmol/L (ref 3.5–5.1)
SODIUM: 135 mmol/L (ref 135–145)
Total Bilirubin: 1.1 mg/dL (ref 0.3–1.2)
Total Protein: 7.2 g/dL (ref 6.5–8.1)

## 2016-02-07 LAB — GLUCOSE, CAPILLARY
Glucose-Capillary: 284 mg/dL — ABNORMAL HIGH (ref 65–99)
Glucose-Capillary: 349 mg/dL — ABNORMAL HIGH (ref 65–99)

## 2016-02-07 LAB — URINE MICROSCOPIC-ADD ON

## 2016-02-07 LAB — LIPASE, BLOOD: Lipase: 25 U/L (ref 11–51)

## 2016-02-07 LAB — I-STAT CG4 LACTIC ACID, ED: Lactic Acid, Venous: 1.43 mmol/L (ref 0.5–1.9)

## 2016-02-07 LAB — I-STAT TROPONIN, ED: TROPONIN I, POC: 0 ng/mL (ref 0.00–0.08)

## 2016-02-07 MED ORDER — SODIUM CHLORIDE 0.9% FLUSH: 3.0000 mL | INTRAVENOUS | Status: DC | PRN

## 2016-02-07 MED ORDER — HEPARIN SODIUM (PORCINE) 1000 UNIT/ML DIALYSIS: 1000.0000 [IU] | INTRAMUSCULAR | Status: DC | PRN

## 2016-02-07 MED ORDER — ONDANSETRON HCL 4 MG/2ML IJ SOLN
4.0000 mg | Freq: Four times a day (QID) | INTRAMUSCULAR | Status: DC
  Administered 2016-02-08 – 2016-02-12 (×18): 4 mg via INTRAVENOUS
  Filled 2016-02-07 (×18): qty 2

## 2016-02-07 MED ORDER — LIDOCAINE HCL (PF) 1 % IJ SOLN: 5.0000 mL | INTRAMUSCULAR | Status: DC | PRN

## 2016-02-07 MED ORDER — SODIUM CHLORIDE 0.9 % IV SOLN: 100.0000 mL | INTRAVENOUS | Status: DC | PRN

## 2016-02-07 MED ORDER — METOCLOPRAMIDE HCL 5 MG/ML IJ SOLN
10.0000 mg | Freq: Once | INTRAMUSCULAR | Status: AC
  Administered 2016-02-07: 10 mg via INTRAVENOUS
  Filled 2016-02-07: qty 2

## 2016-02-07 MED ORDER — MORPHINE SULFATE (PF) 2 MG/ML IV SOLN
2.0000 mg | Freq: Once | INTRAVENOUS | Status: AC
Start: 2016-02-07 — End: 2016-02-07
  Administered 2016-02-07: 2 mg via INTRAVENOUS
  Filled 2016-02-07: qty 1

## 2016-02-07 MED ORDER — ACETAMINOPHEN 325 MG PO TABS
650.0000 mg | ORAL_TABLET | Freq: Four times a day (QID) | ORAL | Status: DC | PRN
  Administered 2016-02-08 – 2016-02-12 (×5): 650 mg via ORAL
  Filled 2016-02-07 (×5): qty 2

## 2016-02-07 MED ORDER — LABETALOL HCL 5 MG/ML IV SOLN
10.0000 mg | Freq: Once | INTRAVENOUS | Status: AC
  Administered 2016-02-07: 10 mg via INTRAVENOUS
  Filled 2016-02-07: qty 4

## 2016-02-07 MED ORDER — PENTAFLUOROPROP-TETRAFLUOROETH EX AERO: 1.0000 "application " | INHALATION_SPRAY | CUTANEOUS | Status: DC | PRN

## 2016-02-07 MED ORDER — GLIPIZIDE 5 MG PO TABS
5.0000 mg | ORAL_TABLET | ORAL | Status: AC
  Administered 2016-02-07: 5 mg via ORAL
  Filled 2016-02-07: qty 1

## 2016-02-07 MED ORDER — INSULIN ASPART 100 UNIT/ML ~~LOC~~ SOLN: 0.0000 [IU] | Freq: Three times a day (TID) | SUBCUTANEOUS | Status: DC

## 2016-02-07 MED ORDER — LIDOCAINE-PRILOCAINE 2.5-2.5 % EX CREA
1.0000 | TOPICAL_CREAM | CUTANEOUS | Status: DC | PRN
Start: 2016-02-07 — End: 2016-02-07

## 2016-02-07 MED ORDER — DEXTROSE 5 % IV SOLN
1.0000 g | INTRAVENOUS | Status: DC
  Administered 2016-02-07 – 2016-02-09 (×3): 1 g via INTRAVENOUS
  Filled 2016-02-07 (×6): qty 10

## 2016-02-07 MED ORDER — POLYETHYLENE GLYCOL 3350 17 G PO PACK: 17.0000 g | PACK | Freq: Every day | ORAL | Status: DC | PRN

## 2016-02-07 MED ORDER — HEPARIN SODIUM (PORCINE) 1000 UNIT/ML DIALYSIS: 4000.0000 [IU] | Freq: Once | INTRAMUSCULAR | Status: DC

## 2016-02-07 MED ORDER — ALBUTEROL SULFATE (2.5 MG/3ML) 0.083% IN NEBU: 2.5000 mg | INHALATION_SOLUTION | RESPIRATORY_TRACT | Status: DC | PRN

## 2016-02-07 MED ORDER — SODIUM CHLORIDE 0.9 % IV BOLUS (SEPSIS)
1000.0000 mL | Freq: Once | INTRAVENOUS | Status: AC
  Administered 2016-02-07: 1000 mL via INTRAVENOUS

## 2016-02-07 MED ORDER — ONDANSETRON HCL 4 MG PO TABS
4.0000 mg | ORAL_TABLET | Freq: Four times a day (QID) | ORAL | Status: DC | PRN
  Filled 2016-02-07: qty 1

## 2016-02-07 MED ORDER — SENNA 8.6 MG PO TABS
1.0000 | ORAL_TABLET | Freq: Two times a day (BID) | ORAL | Status: DC
  Administered 2016-02-07 – 2016-02-09 (×3): 8.6 mg via ORAL
  Filled 2016-02-07 (×7): qty 1

## 2016-02-07 MED ORDER — ALTEPLASE 2 MG IJ SOLR: 2.0000 mg | Freq: Once | INTRAMUSCULAR | Status: DC | PRN

## 2016-02-07 MED ORDER — SODIUM CHLORIDE 0.9 % IV SOLN: 250.0000 mL | INTRAVENOUS | Status: DC | PRN

## 2016-02-07 MED ORDER — SODIUM CHLORIDE 0.9% FLUSH
3.0000 mL | Freq: Two times a day (BID) | INTRAVENOUS | Status: DC
  Administered 2016-02-07 – 2016-02-12 (×10): 3 mL via INTRAVENOUS

## 2016-02-07 MED ORDER — ONDANSETRON HCL 4 MG/2ML IJ SOLN
INTRAMUSCULAR | Status: AC
  Filled 2016-02-07: qty 2

## 2016-02-07 MED ORDER — LABETALOL HCL 5 MG/ML IV SOLN
10.0000 mg | INTRAVENOUS | Status: DC | PRN
  Administered 2016-02-08 (×2): 10 mg via INTRAVENOUS
  Filled 2016-02-07 (×3): qty 4

## 2016-02-07 MED ORDER — ONDANSETRON HCL 4 MG/2ML IJ SOLN
4.0000 mg | Freq: Four times a day (QID) | INTRAMUSCULAR | Status: DC | PRN
  Administered 2016-02-07: 4 mg via INTRAVENOUS

## 2016-02-07 MED ORDER — PROMETHAZINE HCL 25 MG/ML IJ SOLN
12.5000 mg | Freq: Once | INTRAMUSCULAR | Status: AC
Start: 2016-02-07 — End: 2016-02-07
  Administered 2016-02-07: 12:00:00 via INTRAVENOUS
  Filled 2016-02-07: qty 1

## 2016-02-07 MED ORDER — HEPARIN SODIUM (PORCINE) 5000 UNIT/ML IJ SOLN
5000.0000 [IU] | Freq: Three times a day (TID) | INTRAMUSCULAR | Status: DC
  Administered 2016-02-07 – 2016-02-12 (×15): 5000 [IU] via SUBCUTANEOUS
  Filled 2016-02-07 (×15): qty 1

## 2016-02-07 MED ORDER — TRAZODONE HCL 50 MG PO TABS: 50.0000 mg | ORAL_TABLET | Freq: Every evening | ORAL | Status: DC | PRN

## 2016-02-07 MED ORDER — ACETAMINOPHEN 650 MG RE SUPP: 650.0000 mg | Freq: Four times a day (QID) | RECTAL | Status: DC | PRN

## 2016-02-07 MED ORDER — PROCHLORPERAZINE EDISYLATE 5 MG/ML IJ SOLN
10.0000 mg | Freq: Once | INTRAMUSCULAR | Status: AC
  Administered 2016-02-07: 10 mg via INTRAMUSCULAR
  Filled 2016-02-07: qty 2

## 2016-02-07 NOTE — ED Notes (Signed)
Admitting MD paged regarding pt's BP. Waiting on response.

## 2016-02-07 NOTE — Procedures (Signed)
Patient seen on Hemodialysis. QB 400, UF goal 3 liters.  Critical blood sugar called of 556, pt had not received any treatment for high blood glucose yet.  Gave glipizide 5 mg (cannot give insulin in Hd) . Will recheck blood glucose before leaving HD . Treatment adjusted as needed.  Bufford ButtnerElizabeth Laketta Soderberg [facility]. [phone] pgr [phone] 7:41 PM

## 2016-02-07 NOTE — ED Triage Notes (Signed)
Pt in from home with c/o generalized cp, abd pain, n/v since waking this AM. Pt states 9/10 cp, took 1 NTG tab and pain stopped. Pt is HD pt, goes MWF, last trx was on Wednesday, has some abd distention today. BP 205/84 for EMS. L arm fistula

## 2016-02-07 NOTE — Progress Notes (Signed)
CRITICAL VALUE ALERT  Critical value received:  Glucose 566  Date of notification:  02-07-16  Time of notification: 1748  Critical value read back:yes  Nurse who received alert: Kandis BanVeronica Amirr Achord, RN  MD notified (1st [place]): Dr. Signe ColtUpton  Time of first [place]: Present @ time of call  MD notified (2nd [place]):N/A  Time of second [place]:n N/A  Responding MD: Dr. Signe ColtUpton  Time MD responded: [phone]

## 2016-02-07 NOTE — H&P (Signed)
Patient Demographics:    Madeline [name], is a 60 y.o. female  MRN: [id number]   DOB - February 12, 1956  Admit Date - 02/07/2016  Outpatient Primary MD for the patient is [name],[name], MD   Assessment & Plan:    Active Problems:   Diabetes mellitus type II, uncontrolled (HCC)   AnemBrock Badia of chronic renal failure   Emesis, persistent    1)Intractable emesis-no acute abdominal findings, treat with when necessary antiemetics  2) Possible UTI- this may explain #1 above, treat empirically with IV Rocephin pending cultures, patient is not septic. No CVA area tenderness  3)ESRD with hemodialysis on Mondays Wednesdays and Fridays- patient is notoriously noncompliant with her hemodialysis sessions, last hemodialysis session was 02/05/2016 patient actually left AMA less than 3 hours into a 4 hr session . Get nephrology consult for hemodialysis today  4)HTN- resume home meds, compliance advised,   may use IV labetalol when necessary  Every 4 hours for systolic blood pressure over 160 mmhg  5)DM- resume insulin regimen, Use Novolog/Humalog Sliding scale insulin with Accu-Cheks/Fingersticks as ordered, medication and dietary modifications advised   With History of - Reviewed by me  Past Medical History:  Diagnosis Date  . Abnormal Doppler ultrasound of carotid artery    a. Per [place] records: <50% LICA.  Marland Kitchen. Anasarca    a. Per [place] records - due to pulm HTN with R HF.   Marland Kitchen. Aseptic meningitis    a. 09/2012: adm in [place] for metabolic encephalopathy, oliguric tubular necrosis, anemia, HTN, possible CVA, HHNKA.  Marland Kitchen. CHF (congestive heart failure) (HCC)    a. HFpEF with RHF/anasarca/pHTN.  . CKD (chronic kidney disease), stage III    a. Per [place] records: h/o ARF after CTA that ruled out PE.  Marland Kitchen. COPD (chronic obstructive pulmonary disease)  (HCC)   . Coronary artery disease    a. Per [place] records: NSTEMI 01/2012, tx medically given ARF but suspected CAD. b. Stress test 12/16/11 reported w/o ischemia.  Marland Kitchen. CVA (cerebral infarction)    a. Per [place] records, "possible CVA" 09/2012 but MRI reportedly negative.  . Diabetes mellitus (HCC)   . Dialysis patient [facility](HCC)    Mon, Wed, Fridays  . Hypertension   . OSA (obstructive sleep apnea)    a. Pt reported used to use CPAP in [place], but [facility]  Marland Kitchen. Pulmonary hypertension (HCC)    a. [facility] 02/28/13: mod pulm HTN with normal PVR suggestive of predominantly pulmonary venous HTN.  Marland Kitchen. Renal insufficiency    dialysis 3 days/week  . Right heart failure (HCC)   . Stroke [facility](HCC) 2014      Past Surgical History:  Procedure Laterality Date  . ABDOMINAL HYSTERECTOMY    . AV FISTULA PLACEMENT Left 03/03/2013   Procedure: ARTERIOVENOUS (AV) FISTULA CREATION, Brachial/Cephalic;  Surgeon: Larina Earthlyodd F Early, MD;  [facility];  Service: Vascular;  Laterality: Left;  . ESOPHAGOGASTRODUODENOSCOPY N/A 02/16/2013  Procedure: ESOPHAGOGASTRODUODENOSCOPY (EGD);  Surgeon: Graylin Shiver, MD;  Location: [facility] ENDOSCOPY;  Service: Endoscopy;  Laterality: N/A;  . INSERTION OF DIALYSIS CATHETER Right 03/03/2013   Procedure: INSERTION OF DIALYSIS CATHETER;  Surgeon: Larina Earthly, MD;  Location[facility] OR;  Service: Vascular;  Laterality: Right;  Right Internal Jugular Placement  . RIGHT HEART CATHETERIZATION N/A 02/28/2013   Procedure: RIGHT HEART CATH;  Surgeon: Dolores Patty, MD;  Location: [facility] CATH LAB;  Service: Cardiovascular;  Laterality: N/A;  . SHUNTOGRAM N/A 07/13/2013   Procedure: FISTULOGRAM;  Surgeon: Fransisco Hertz, MD;  Location: [facility] CATH LAB;  Service: Cardiovascular;  Laterality: N/A;  . TUBAL LIGATION        Chief Complaint  Patient presents with  . Chest Pain  . Abdominal Pain  . Emesis      HPI:    Madeline [name]  is a 60 y.o. female, With past medical history relevant for end-stage renal  disease with hemodialysis on Mondays Wednesdays and Fridays, hypertension diabetes and history of pulmonary hypertension who is usually noncompliant with her hemodialysis sessions presents today with intractable emesis of less than 24 hours duration. No fever no chills no diarrhea no sick contacts at home. Last dialysis session was 02/05/2016 patient apparently left her hemodialysis session less than 3 hours into a 4 session. No syncope. No chest pain she does have some dizziness but no palpitations  In ED- patient found to have UA suggestive of UTI, she received IV Rocephin pending cultures   Review of systems:    In addition to the HPI above,   A full 12 point Review of 10 Systems was done, except as stated above, all other Review of 10 Systems were negative.    Social History:  Reviewed by me    Social History  Substance Use Topics  . Smoking status: Former Games developer  . Smokeless tobacco: Current User    Types: Snuff, Chew     Comment: quit many years ago  . Alcohol use No       Family History :  Reviewed by me    Family History  Problem Relation Age of Onset  . Diabetes Mellitus II Sister   . Diabetes Mellitus II Brother   . CAD Brother      Home Medications:   Prior to Admission medications   Medication Sig Start Date End Date Taking? Authorizing Provider  acetaminophen (TYLENOL) 500 MG tablet Take 500-1,000 mg by mouth every 6 (six) hours as needed for moderate pain.   Yes Historical Provider, MD  albuterol (PROVENTIL HFA;VENTOLIN HFA) 108 (90 BASE) MCG/ACT inhaler Inhale 2 puffs into the lungs every 6 (six) hours as needed for wheezing or shortness of breath.    Yes Historical Provider, MD  amLODipine (NORVASC) 10 MG tablet Take 10 mg by mouth daily.    Yes Historical Provider, MD  aspirin EC 81 MG tablet Take 81 mg by mouth daily.   Yes Historical Provider, MD  atorvastatin (LIPITOR) 40 MG tablet Take 40 mg by mouth daily.   Yes Historical Provider, MD  calcitRIOL  (ROCALTROL) 0.25 MCG capsule Take 0.25 mcg by mouth daily.   Yes Historical Provider, MD  calcium acetate (PHOSLO) 667 MG capsule Take 667 mg by mouth 3 (three) times daily with meals.   Yes Historical Provider, MD  carvedilol (COREG) 25 MG tablet Take 25 mg by mouth 2 (two) times daily with a meal.   Yes Historical Provider, MD  insulin glargine (LANTUS)  100 UNIT/ML injection Inject 10 Units into the skin at bedtime.   Yes Historical Provider, MD  insulin lispro (HUMALOG) 100 UNIT/ML injection Inject 3-10 Units into the skin 3 (three) times daily before meals. Sliding scale.  CBG 200: 3 UNITS, 250 5 UNITS, 300 7 UNITS, 350 9-10 UNITS   Yes Historical Provider, MD  metoCLOPramide (REGLAN) 5 MG tablet Take 1 tablet (5 mg total) by mouth 4 (four) times daily -  before meals and at bedtime. 10/26/14  Yes Jessica U Vann, DO  pantoprazole (PROTONIX) 40 MG tablet Take 1 tablet (40 mg total) by mouth 2 (two) times daily. 06/05/13  Yes Coolidge Breeze, MD  polyethylene glycol (MIRALAX / GLYCOLAX) packet Take 17 g by mouth daily. 10/16/14  Yes Zannie Cove, MD  promethazine (PHENERGAN) 25 MG tablet Take 25 mg by mouth 2 (two) times daily as needed for nausea or vomiting.    Yes Historical Provider, MD  sevelamer carbonate (RENVELA) 800 MG tablet Take 800 mg by mouth 3 (three) times daily with meals.   Yes Historical Provider, MD  traMADol (ULTRAM) 50 MG tablet Take by mouth 2 (two) times daily. prn   Yes Historical Provider, MD  nitroGLYCERIN (NITROSTAT) 0.4 MG SL tablet PLACE ONE TABLET UNDER THE TONGUE EVERY FIVE MINUTES AS NEEDED FOR CHEST PAIN 07/11/15   Dolores Patty, MD     Allergies:    No Known Allergies   Physical Exam:   Vitals  Blood pressure (!) 192/73, pulse 83, temperature 98.7 F (37.1 C), temperature source Oral, resp. rate 20, SpO2 96 %.  Physical Examination: General appearance - alert, well appearing, and in no distress and  Mental status - alert, oriented to person, place,  and time,  Eyes - sclera anicteric Neck - supple, no JVD elevation , Chest - clear  to auscultation bilaterally, symmetrical air movement,  Heart - S1 and S2 normal,  Abdomen - soft, nontender, nondistended, no masses or organomegaly, no CVA area tenderness Neurological - screening mental status exam normal, neck supple without rigidity, cranial nerves II through XII intact, DTR's normal and symmetric Extremities - Hemodialysis access site with positive thrill and  bruit  Skin - warm, dry    Data Review:    CBC  Recent Labs Lab 02/04/16 1340 02/07/16 1000  WBC 4.1 5.6  HGB 11.2* 12.2  HCT 36.3 38.5  PLT 129* 129*  MCV 94.5 93.7  MCH 29.2 29.7  MCHC 30.9* 31.7  RDW 14.4 14.4  LYMPHSABS 1,722 1.0  MONOABS 369 0.2  EOSABS 41 0.0  BASOSABS 0 0.0   ------------------------------------------------------------------------------------------------------------------  Chemistries   Recent Labs Lab 02/04/16 1340 02/07/16 1000 02/07/16 1714  NA 133* 135 136  K 4.4 4.3 4.9  CL 96* 99* 100*  CO2 25 20* 19*  GLUCOSE 649* 484* 566*  BUN 9 19 24*  CREATININE 3.12* 4.24* 4.33*  CALCIUM 9.2 9.5 9.3  AST 28 35  --   ALT 17  18 22   --   ALKPHOS 219* 181*  --   BILITOT 0.4 1.1  --    ------------------------------------------------------------------------------------------------------------------ CrCl cannot be calculated (Unknown ideal weight.). ------------------------------------------------------------------------------------------------------------------ No results for input(s): TSH, T4TOTAL, T3FREE, THYROIDAB in the last 72 hours.  Invalid input(s): FREET3   Coagulation profile  Recent Labs Lab 02/04/16 1340  INR 1.1   ------------------------------------------------------------------------------------------------------------------- No results for input(s): DDIMER in the last 72  hours. -------------------------------------------------------------------------------------------------------------------  Cardiac Enzymes No results for input(s): CKMB, TROPONINI, MYOGLOBIN in the last 168  hours.  Invalid input(s): CK ------------------------------------------------------------------------------------------------------------------ No results found for: BNP   ---------------------------------------------------------------------------------------------------------------  Urinalysis    Component Value Date/Time   COLORURINE YELLOW 02/07/2016 1006   APPEARANCEUR CLOUDY (A) 02/07/2016 1006   LABSPEC 1.021 02/07/2016 1006   PHURINE 7.5 02/07/2016 1006   GLUCOSEU >1000 (A) 02/07/2016 1006   HGBUR TRACE (A) 02/07/2016 1006   BILIRUBINUR NEGATIVE 02/07/2016 1006   KETONESUR 15 (A) 02/07/2016 1006   PROTEINUR 100 (A) 02/07/2016 1006   UROBILINOGEN 0.2 10/24/2014 0057   NITRITE NEGATIVE 02/07/2016 1006   LEUKOCYTESUR SMALL (A) 02/07/2016 1006    ----------------------------------------------------------------------------------------------------------------   Imaging Results:    Dg Chest Portable 1 View  Result Date: 02/07/2016 CLINICAL DATA:  CHEST PAIN,HX HTN,DM,COPD,CHF,RENAL DZ EXAM: PORTABLE CHEST - 1 VIEW COMPARISON:  10/23/2014 FINDINGS: Low lung volumes.  No focal infiltrate or overt edema. Heart size upper limits normal for technique. Atheromatous aortic arch. No effusion. Visualized bones unremarkable. IMPRESSION: 1. Low volumes.  No acute disease. 2.  Aortic Atherosclerosis (ICD10-170.0) Electronically Signed   By: Corlis Leak M.D.   On: 02/07/2016 12:11    Radiological Exams on Admission: Dg Chest Portable 1 View  Result Date: 02/07/2016 CLINICAL DATA:  CHEST PAIN,HX HTN,DM,COPD,CHF,RENAL DZ EXAM: PORTABLE CHEST - 1 VIEW COMPARISON:  10/23/2014 FINDINGS: Low lung volumes.  No focal infiltrate or overt edema. Heart size upper limits normal for technique.  Atheromatous aortic arch. No effusion. Visualized bones unremarkable. IMPRESSION: 1. Low volumes.  No acute disease. 2.  Aortic Atherosclerosis (ICD10-170.0) Electronically Signed   By: Corlis Leak M.D.   On: 02/07/2016 12:11    DVT Prophylaxis -SCD   AM Labs Ordered, also please review Full Orders  Family Communication: Admission, patients condition and plan of care including tests being ordered have been discussed with the patient who indicate understanding and agree with the plan   Code Status - Full Code  Likely DC to  home  Condition   stable  Cecille Mcclusky M.D on 02/07/2016 at 7:07 PM   Between 7am to 7pm - Pager - [phone]  After 7pm go to [email or web address] - password [email or web address]  [facility] - Office  [phone]  Dragon dictation system was used to create this note, attempts have been made to correct errors, however presence of uncorrected errors is not a reflection quality of care provided.

## 2016-02-07 NOTE — Consult Note (Signed)
[facility] Renal Consultation Note    Indication for Consultation:  Management of ESRD/hemodialysis; anemia, hypertension/volume and secondary hyperparathyroidism PCP:  HPI: Madeline [name] is a 60 y.o. female with ESRD on hemodialysis MWF at [facility]. PMH significant for DM, HTN, cardiorenal syndrome, CAD, CHF, CVA, OSA. Pulmonary hypertension, medical noncompliance with HD. Last HD was 02/05/16 stayed 2 hours 55 minutes of 4 hour treatment left 0.2 kg under EDW.   Patient seen in ED, appears lethargic, slow to answer questions. Per report of brother, patient awakened with abdominal pain, nausea & vomiting. Per ED note, patient had 10/10 chest pain relieved with 1 NTG. Onset of symptoms this AM. Was brought to ED for evaluation. Upon arrival to ED T-98.1 BP 212/84 HR 83 RR 15 O2 sats 98 on RA. EKG shows SR, possible atrial enlargement, unchanged since last EKG. Na 135 K+ 4.3 Cl 99 Scr 4.24 BUN 19 BS 484 Co2 20 Ca 9.5 Anion Gap 16 Lactic acid 1.43 troponin 0.00. WBC 5.6 HGB 12.2 HCT 38.5 PLT 129. CXR unremarkable. Patient is being admitted for hyperglycemia, we gave been asked to manage HD.  [facility]. Patient has signed off early last five treatments. Gets to or below EDW each treatment, does not have high IDWG. Phosphorous controlled, HGB has been stable in range of 10.9-12.0. Adequacy is surprisingly good in the setting of chronic early sign offs.    Past Medical History:  Diagnosis Date  . Abnormal Doppler ultrasound of carotid artery    a. Per [place] records: <50% LICA.  Marland Kitchen. Anasarca    a. Per [place] records - due to pulm HTN with R HF.   Marland Kitchen. Aseptic meningitis    a. 09/2012: adm in [place] for metabolic encephalopathy, oliguric tubular necrosis, anemia, HTN, possible CVA, HHNKA.  Marland Kitchen. CHF (congestive heart failure) (HCC)    a. HFpEF with RHF/anasarca/pHTN.  . CKD (chronic kidney disease), stage III    a. Per [place] records: h/o ARF after CTA  that ruled out PE.  Marland Kitchen. COPD (chronic obstructive pulmonary disease) (HCC)   . Coronary artery disease    a. Per [place] records: NSTEMI 01/2012, tx medically given ARF but suspected CAD. b. Stress test 12/16/11 reported w/o ischemia.  Marland Kitchen. CVA (cerebral infarction)    a. Per [place] records, "possible CVA" 09/2012 but MRI reportedly negative.  . Diabetes mellitus (HCC)   . Dialysis patient [facility](HCC)    Mon, Wed, Fridays  . Hypertension   . OSA (obstructive sleep apnea)    a. Pt reported used to use CPAP in [place], but "[facility]" [facility]  Marland Kitchen. Pulmonary hypertension (HCC)    a. RHC 02/28/13: mod pulm HTN with normal PVR suggestive of predominantly pulmonary venous HTN.  Marland Kitchen. Renal insufficiency    dialysis 3 days/week  . Right heart failure (HCC)   . Stroke [facility](HCC) 2014   Past Surgical History:  Procedure Laterality Date  . ABDOMINAL HYSTERECTOMY    . AV FISTULA PLACEMENT Left 03/03/2013   Procedure: ARTERIOVENOUS (AV) FISTULA CREATION, Brachial/Cephalic;  Surgeon: Larina Earthlyodd F Early, MD;  [facility];  Service: Vascular;  Laterality: Left;  . ESOPHAGOGASTRODUODENOSCOPY N/A 02/16/2013   Procedure: ESOPHAGOGASTRODUODENOSCOPY (EGD);  Surgeon: Graylin ShiverSalem F Ganem, MD;  [facility];  Service: Endoscopy;  Laterality: N/A;  . INSERTION OF DIALYSIS CATHETER Right 03/03/2013   Procedure: INSERTION OF DIALYSIS CATHETER;  Surgeon: Larina Earthlyodd F Early, MD;  [facility];  Service: Vascular;  Laterality: Right;  Right Internal Jugular Placement  . RIGHT HEART CATHETERIZATION N/A 02/28/2013   Procedure: RIGHT HEART CATH;  Surgeon: Dolores Patty, MD;  Location: [facility] CATH LAB;  Service: Cardiovascular;  Laterality: N/A;  . SHUNTOGRAM N/A 07/13/2013   Procedure: FISTULOGRAM;  Surgeon: Fransisco Hertz, MD;  Location: [facility] CATH LAB;  Service: Cardiovascular;  Laterality: N/A;  . TUBAL LIGATION     Family History  Problem Relation Age of Onset  . Diabetes Mellitus II Sister   . Diabetes Mellitus II Brother   . CAD Brother     Social History:  reports that she has quit smoking. Her smokeless tobacco use includes Snuff and Chew. She reports that she does not drink alcohol or use drugs. No Known Allergies Prior to Admission medications   Medication Sig Start Date End Date Taking? Authorizing Provider  acetaminophen (TYLENOL) 500 MG tablet Take 500-1,000 mg by mouth every 6 (six) hours as needed for moderate pain.   Yes Historical Provider, MD  albuterol (PROVENTIL HFA;VENTOLIN HFA) 108 (90 BASE) MCG/ACT inhaler Inhale 2 puffs into the lungs every 6 (six) hours as needed for wheezing or shortness of breath.    Yes Historical Provider, MD  amLODipine (NORVASC) 10 MG tablet Take 10 mg by mouth daily.    Yes Historical Provider, MD  aspirin EC 81 MG tablet Take 81 mg by mouth daily.   Yes Historical Provider, MD  atorvastatin (LIPITOR) 40 MG tablet Take 40 mg by mouth daily.   Yes Historical Provider, MD  calcitRIOL (ROCALTROL) 0.25 MCG capsule Take 0.25 mcg by mouth daily.   Yes Historical Provider, MD  calcium acetate (PHOSLO) 667 MG capsule Take 667 mg by mouth 3 (three) times daily with meals.   Yes Historical Provider, MD  carvedilol (COREG) 25 MG tablet Take 25 mg by mouth 2 (two) times daily with a meal.   Yes Historical Provider, MD  insulin glargine (LANTUS) 100 UNIT/ML injection Inject 10 Units into the skin at bedtime.   Yes Historical Provider, MD  insulin lispro (HUMALOG) 100 UNIT/ML injection Inject 3-10 Units into the skin 3 (three) times daily before meals. Sliding scale.  CBG 200: 3 UNITS, 250 5 UNITS, 300 7 UNITS, 350 9-10 UNITS   Yes Historical Provider, MD  metoCLOPramide (REGLAN) 5 MG tablet Take 1 tablet (5 mg total) by mouth 4 (four) times daily -  before meals and at bedtime. 10/26/14  Yes Jessica U Vann, DO  pantoprazole (PROTONIX) 40 MG tablet Take 1 tablet (40 mg total) by mouth 2 (two) times daily. 06/05/13  Yes Coolidge Breeze, MD  polyethylene glycol (MIRALAX / GLYCOLAX) packet Take 17 g by  mouth daily. 10/16/14  Yes Zannie Cove, MD  promethazine (PHENERGAN) 25 MG tablet Take 25 mg by mouth 2 (two) times daily as needed for nausea or vomiting.    Yes Historical Provider, MD  sevelamer carbonate (RENVELA) 800 MG tablet Take 800 mg by mouth 3 (three) times daily with meals.   Yes Historical Provider, MD  traMADol (ULTRAM) 50 MG tablet Take by mouth 2 (two) times daily. prn   Yes Historical Provider, MD  nitroGLYCERIN (NITROSTAT) 0.4 MG SL tablet PLACE ONE TABLET UNDER THE TONGUE EVERY FIVE MINUTES AS NEEDED FOR CHEST PAIN 07/11/15   Dolores Patty, MD   Current Facility-Administered Medications  Medication Dose Route Frequency Provider Last Rate Last Dose  . cefTRIAXone (ROCEPHIN) 1 g in dextrose 5 % 50 mL IVPB  1 g Intravenous  Q24H Shon Hale, MD       Current Outpatient Prescriptions  Medication Sig Dispense Refill  . acetaminophen (TYLENOL) 500 MG tablet Take 500-1,000 mg by mouth every 6 (six) hours as needed for moderate pain.    Marland Kitchen albuterol (PROVENTIL HFA;VENTOLIN HFA) 108 (90 BASE) MCG/ACT inhaler Inhale 2 puffs into the lungs every 6 (six) hours as needed for wheezing or shortness of breath.     Marland Kitchen amLODipine (NORVASC) 10 MG tablet Take 10 mg by mouth daily.     Marland Kitchen aspirin EC 81 MG tablet Take 81 mg by mouth daily.    Marland Kitchen atorvastatin (LIPITOR) 40 MG tablet Take 40 mg by mouth daily.    . calcitRIOL (ROCALTROL) 0.25 MCG capsule Take 0.25 mcg by mouth daily.    . calcium acetate (PHOSLO) 667 MG capsule Take 667 mg by mouth 3 (three) times daily with meals.    . carvedilol (COREG) 25 MG tablet Take 25 mg by mouth 2 (two) times daily with a meal.    . insulin glargine (LANTUS) 100 UNIT/ML injection Inject 10 Units into the skin at bedtime.    . insulin lispro (HUMALOG) 100 UNIT/ML injection Inject 3-10 Units into the skin 3 (three) times daily before meals. Sliding scale.  CBG 200: 3 UNITS, 250 5 UNITS, 300 7 UNITS, 350 9-10 UNITS    . metoCLOPramide (REGLAN) 5 MG tablet  Take 1 tablet (5 mg total) by mouth 4 (four) times daily -  before meals and at bedtime. 90 tablet 0  . pantoprazole (PROTONIX) 40 MG tablet Take 1 tablet (40 mg total) by mouth 2 (two) times daily. 60 tablet 1  . polyethylene glycol (MIRALAX / GLYCOLAX) packet Take 17 g by mouth daily. 14 each 0  . promethazine (PHENERGAN) 25 MG tablet Take 25 mg by mouth 2 (two) times daily as needed for nausea or vomiting.     . sevelamer carbonate (RENVELA) 800 MG tablet Take 800 mg by mouth 3 (three) times daily with meals.    . traMADol (ULTRAM) 50 MG tablet Take by mouth 2 (two) times daily. prn    . nitroGLYCERIN (NITROSTAT) 0.4 MG SL tablet PLACE ONE TABLET UNDER THE TONGUE EVERY FIVE MINUTES AS NEEDED FOR CHEST PAIN 30 tablet 3   Labs: Basic Metabolic Panel:  Recent Labs Lab 02/04/16 1340 02/07/16 1000  NA 133* 135  K 4.4 4.3  CL 96* 99*  CO2 25 20*  GLUCOSE 649* 484*  BUN 9 19  CREATININE 3.12* 4.24*  CALCIUM 9.2 9.5   Liver Function Tests:  Recent Labs Lab 02/04/16 1340 02/07/16 1000  AST 28 35  ALT 17  18 22   ALKPHOS 219* 181*  BILITOT 0.4 1.1  PROT 6.4 7.2  ALBUMIN 3.5* 3.7    Recent Labs Lab 02/07/16 1000  LIPASE 25   CBC:  Recent Labs Lab 02/04/16 1340 02/07/16 1000  WBC 4.1 5.6  NEUTROABS 1,968 4.5  HGB 11.2* 12.2  HCT 36.3 38.5  MCV 94.5 93.7  PLT 129* 129*   Studies/Results: Dg Chest Portable 1 View  Result Date: 02/07/2016 CLINICAL DATA:  CHEST PAIN,HX HTN,DM,COPD,CHF,RENAL DZ EXAM: PORTABLE CHEST - 1 VIEW COMPARISON:  10/23/2014 FINDINGS: Low lung volumes.  No focal infiltrate or overt edema. Heart size upper limits normal for technique. Atheromatous aortic arch. No effusion. Visualized bones unremarkable. IMPRESSION: 1. Low volumes.  No acute disease. 2.  Aortic Atherosclerosis (ICD10-170.0) Electronically Signed   By: Corlis Leak M.D.   On: 02/07/2016  12:11    ROS: As per HPI otherwise negative.   Physical Exam: Vitals:   02/07/16 0826 02/07/16  1017 02/07/16 1041 02/07/16 1248  BP: (!) 212/84 185/67 (!) 210/78 (!) 201/70  Pulse: 83 84 85 89  Resp: 15 18 22 20   Temp: 98.1 F (36.7 C)     TempSrc: Oral     SpO2: 98% 97% 97% 98%     General: chronically ill appearing female, lethargic in NAD Head: Normocephalic, atraumatic, sclera non-icteric, mucus membranes are moist Neck: Supple. JVD not elevated. Lungs: Clear bilaterally to auscultation without wheezes, rales, or rhonchi. Breathing is unlabored. Heart: RRR with S1 S2. No murmurs, rubs, or gallops appreciated. SR on monitor. Abdomen: Soft, slightly tender upper quadrants. Non-distended, active BS M-S:  Strength and tone appear normal for age. Lower extremities:without edema or ischemic changes, no open wounds  Neuro: Alert and oriented X 3. Moves all extremities spontaneously. Psych:  Lethargic, answering questions slowly.  Dialysis Access: LUA AVF +Bruit  Dialysis Orders: [facility] MWF 4 hours 400/Auto 1.5 63 kg  3.0K/2.5 Ca UF profile 4 Heparin 4000 units IV q treatment Aranesp 40 mcg IV q week (should have gotten dose 02/05/16-last actual dose documented 09/20/017 HGB 12.0 02/05/16) Venofer 50 mg IV (last dose 02/05/16 fe101 Tsat 39% 02/05/16  Assessment/Plan: 1.  Uncontrolled DM/Hyperglycemia: Per primary BS 484 on admission. No further BS documented. Anion Gap 16. Co2 20. 2. Abdominal pain/N & V: Improved with antiemetics. Per primary 3.  ESRD -  MWF [facility]. HD today on schedule. Use 3.0 K bath. K+ 4.3 now usually 3.8 in-center.  4.  Hypertension/volume  -Extremely hypertensive in ED BP 199/72 presently. Takes coreg 12.5 mg PO BID per OP med list. Leaving slightly under EDW. No edema. Attempt 2-3 liters. 5.  Anemia  -HGB 12.2. No ESA needed. Follow HGB 6.  Metabolic bone disease -  Hold binders-last phos 2.9 02/05/16, Cont VDRA. Ca 9.5 C Ca 9.74 PTH 366 02/05/16 7.  Nutrition - NPO at present. Renal/Carb mod diet when able to eat.  Cary Wilford H. Manson Passey, NP-C 02/07/2016, 1:34 PM   [employer] [phone]

## 2016-02-07 NOTE — ED Notes (Signed)
Attempted PIV placement x2 without success 

## 2016-02-07 NOTE — ED Provider Notes (Addendum)
[facility]-EMERGENCY DEPT Provider Note   CSN: [id number] Arrival date & time: 02/07/16  0818     History   Chief Complaint Chief Complaint  Patient presents with  . Chest Pain  . Abdominal Pain  . Emesis    HPI Madeline [name] is a 60 y.o. female.  HPI Patient presents from home with generalized abdominal pain nausea and vomiting since awakening this morning.  States she has not had a 10 chest pain.  Took a nitroglycerin the pain stop. Past Medical History:  Diagnosis Date  . Abnormal Doppler ultrasound of carotid artery    a. Per [place] records: <50% LICA.  Marland Kitchen Anasarca    a. Per [place] records - due to pulm HTN with R HF.   Marland Kitchen Aseptic meningitis    a. 09/2012: adm in [place] for metabolic encephalopathy, oliguric tubular necrosis, anemia, HTN, possible CVA, HHNKA.  Marland Kitchen CHF (congestive heart failure) (HCC)    a. HFpEF with RHF/anasarca/pHTN.  . CKD (chronic kidney disease), stage III    a. Per [place] records: h/o ARF after CTA that ruled out PE.  Marland Kitchen COPD (chronic obstructive pulmonary disease) (HCC)   . Coronary artery disease    a. Per [place] records: NSTEMI 01/2012, tx medically given ARF but suspected CAD. b. Stress test 12/16/11 reported w/o ischemia.  Marland Kitchen CVA (cerebral infarction)    a. Per [place] records, "possible CVA" 09/2012 but MRI reportedly negative.  . Diabetes mellitus (HCC)   . Dialysis patient [facility])    Mon, Wed, Fridays  . Hypertension   . OSA (obstructive sleep apnea)    a. Pt reported used to use CPAP in , but "ran out" when came to [facility].  Marland Kitchen Pulmonary hypertension (HCC)    a. RHC 02/28/13: mod pulm HTN with normal PVR suggestive of predominantly pulmonary venous HTN.  Marland Kitchen Renal insufficiency    dialysis 3 days/week  . Right heart failure (HCC)   . Stroke [facility]) 2014    Patient Active Problem List   Diagnosis Date Noted  . Abnormal abdominal ultrasound 10/24/2014  . Acute encephalopathy 10/23/2014  . DKA, type 2, not at goal [facility]) 10/23/2014  . Nausea & vomiting 10/23/2014  . Hypertension  10/23/2014  . Abdominal pain   . Encounter for central line placement   . Fever   . Anemia 10/10/2014  . Hypoglycemia 02/07/2014  . Seizure (HCC) 02/07/2014  . ESRD on hemodialysis (HCC) 02/07/2014  . Urinary tract infection, site not specified 02/07/2014  . Pain in the abdomen 01/10/2014  . Anemia of chronic renal failure 01/10/2014  . Secondary hyperparathyroidism (HCC) 01/10/2014  . Hyperglycemia 01/07/2014  . End stage renal disease (HCC) 10/13/2013  . PUD (peptic ulcer disease) 06/03/2013  . Chronic diastolic heart failure (HCC) 03/21/2013  . Acute on chronic diastolic congestive heart failure (HCC) 03/01/2013  . OSA on CPAP 02/16/2013  . HTN (hypertension), malignant 02/14/2013  . Diabetes mellitus type II, uncontrolled (HCC) 02/14/2013  . DM (diabetes mellitus) (HCC) 04/05/2012    Past Surgical History:  Procedure Laterality Date  . ABDOMINAL HYSTERECTOMY    . AV FISTULA PLACEMENT Left 03/03/2013   Procedure: ARTERIOVENOUS (AV) FISTULA CREATION, Brachial/Cephalic;  Surgeon: Larina Earthly, MD;  Location: [facility] OR;  Service: Vascular;  Laterality: Left;  . ESOPHAGOGASTRODUODENOSCOPY N/A 02/16/2013   Procedure: ESOPHAGOGASTRODUODENOSCOPY (EGD);  Surgeon: Graylin Shiver, MD;  Location: [facility] ENDOSCOPY;  Service: Endoscopy;  Laterality: N/A;  . INSERTION OF DIALYSIS CATHETER Right 03/03/2013   Procedure: INSERTION OF DIALYSIS CATHETER;  Surgeon: Larina Earthly, MD;  Location[facility] OR;  Service: Vascular;  Laterality: Right;  Right Internal Jugular Placement  . RIGHT HEART CATHETERIZATION N/A 02/28/2013   Procedure: RIGHT HEART CATH;  Surgeon: Dolores Patty, MD;  Location: [facility] CATH LAB;  Service: Cardiovascular;  Laterality: N/A;  . SHUNTOGRAM N/A 07/13/2013   Procedure: FISTULOGRAM;  Surgeon: Fransisco Hertz, MD;  Location: [facility] CATH LAB;  Service: Cardiovascular;  Laterality: N/A;  . TUBAL LIGATION      OB History    No data available       Home Medications    Prior to Admission  medications   Medication Sig Start Date End Date Taking? Authorizing Provider  acetaminophen (TYLENOL) 500 MG tablet Take 500-1,000 mg by mouth every 6 (six) hours as needed for moderate pain.   Yes Historical Provider, MD  albuterol (PROVENTIL HFA;VENTOLIN HFA) 108 (90 BASE) MCG/ACT inhaler Inhale 2 puffs into the lungs every 6 (six) hours as needed for wheezing or shortness of breath.    Yes Historical Provider, MD  amLODipine (NORVASC) 10 MG tablet Take 10 mg by mouth daily.    Yes Historical Provider, MD  aspirin EC 81 MG tablet Take 81 mg by mouth daily.   Yes Historical Provider, MD  atorvastatin (LIPITOR) 40 MG tablet Take 40 mg by mouth daily.   Yes Historical Provider, MD  calcitRIOL (ROCALTROL) 0.25 MCG capsule Take 0.25 mcg by mouth daily.   Yes Historical Provider, MD  calcium acetate (PHOSLO) 667 MG capsule Take 667 mg by mouth 3 (three) times daily with meals.   Yes Historical Provider, MD  carvedilol (COREG) 25 MG tablet Take 25 mg by mouth 2 (two) times daily with a meal.   Yes Historical Provider, MD  insulin glargine (LANTUS) 100 UNIT/ML injection Inject 10 Units into the skin at bedtime.   Yes Historical Provider, MD  insulin lispro (HUMALOG) 100 UNIT/ML injection Inject 3-10 Units into the skin 3 (three) times daily before meals. Sliding scale.  CBG 200: 3 UNITS, 250 5 UNITS, 300 7 UNITS, 350 9-10 UNITS   Yes Historical Provider, MD  metoCLOPramide (REGLAN) 5 MG tablet Take 1 tablet (5 mg total) by mouth 4 (four) times daily -  before meals and at bedtime. 10/26/14  Yes Jessica U Vann, DO  pantoprazole (PROTONIX) 40 MG tablet Take 1 tablet (40 mg total) by mouth 2 (two) times daily. 06/05/13  Yes Coolidge Breeze, MD  polyethylene glycol (MIRALAX / GLYCOLAX) packet Take 17 g by mouth daily. 10/16/14  Yes Zannie Cove, MD  promethazine (PHENERGAN) 25 MG tablet Take 25 mg by mouth 2 (two) times daily as needed for nausea or vomiting.    Yes Historical Provider, MD  sevelamer  carbonate (RENVELA) 800 MG tablet Take 800 mg by mouth 3 (three) times daily with meals.   Yes Historical Provider, MD  traMADol (ULTRAM) 50 MG tablet Take by mouth 2 (two) times daily. prn   Yes Historical Provider, MD  nitroGLYCERIN (NITROSTAT) 0.4 MG SL tablet PLACE ONE TABLET UNDER THE TONGUE EVERY FIVE MINUTES AS NEEDED FOR CHEST PAIN 07/11/15   Dolores Patty, MD    Family History Family History  Problem Relation Age of Onset  . Diabetes Mellitus II Sister   . Diabetes Mellitus II Brother   . CAD Brother     Social History Social History  Substance Use Topics  . Smoking status: Former Games developer  . Smokeless tobacco: Current User    Types:  Snuff, Chew     Comment: quit many years ago  . Alcohol use No     Allergies   Review of patient's allergies indicates no known allergies.   Review of Systems Review of Systems  Constitutional: Negative for fever.  Gastrointestinal: Positive for nausea and vomiting. Negative for abdominal distention.  All other systems reviewed and are negative.    Physical Exam Updated Vital Signs BP (!) 201/70   Pulse 89   Temp 98.1 F (36.7 C) (Oral)   Resp 20   SpO2 98%   Physical Exam  Constitutional: She is oriented to person, place, and time. She appears well-developed and well-nourished. No distress.  HENT:  Head: Normocephalic and atraumatic.  Eyes: Pupils are equal, round, and reactive to light.  Neck: Normal range of motion.  Cardiovascular: Normal rate and intact distal pulses.   Pulmonary/Chest: No respiratory distress.  Abdominal: Soft. Normal appearance and bowel sounds are normal. She exhibits no distension and no mass. There is tenderness. There is no rebound and no guarding.  Musculoskeletal: Normal range of motion.  Neurological: She is alert and oriented to person, place, and time. No cranial nerve deficit.  Skin: Skin is warm and dry. No rash noted.  Psychiatric: She has a normal mood and affect. Her behavior is  normal.  Nursing note and vitals reviewed.    ED Treatments / Results  Labs (all labs ordered are listed, but only abnormal results are displayed) Labs Reviewed  CBC WITH DIFFERENTIAL/PLATELET - Abnormal; Notable for the following:       Result Value   Platelets 129 (*)    All other components within normal limits  COMPREHENSIVE METABOLIC PANEL - Abnormal; Notable for the following:    Chloride 99 (*)    CO2 20 (*)    Glucose, Bld 484 (*)    Creatinine, Ser 4.24 (*)    Alkaline Phosphatase 181 (*)    GFR calc non Af Amer 11 (*)    GFR calc Af Amer 12 (*)    Anion gap 16 (*)    All other components within normal limits  URINALYSIS, ROUTINE W REFLEX MICROSCOPIC (NOT AT [facility]) - Abnormal; Notable for the following:    APPearance CLOUDY (*)    Glucose, UA >1000 (*)    Hgb urine dipstick TRACE (*)    Ketones, ur 15 (*)    Protein, ur 100 (*)    Leukocytes, UA SMALL (*)    All other components within normal limits  URINE MICROSCOPIC-ADD ON - Abnormal; Notable for the following:    Squamous Epithelial / LPF 6-30 (*)    Bacteria, UA FEW (*)    All other components within normal limits  URINE CULTURE  LIPASE, BLOOD  I-STAT CG4 LACTIC ACID, ED  I-STAT TROPOININ, ED    EKG  EKG Interpretation  Date/Time:  Friday February 07 2016 08:19:41 EDT Ventricular Rate:  83 PR Interval:    QRS Duration: 88 QT Interval:  402 QTC Calculation: 473 R Axis:   69 Text Interpretation:  Sinus rhythm Ventricular premature complex Consider left atrial enlargement Probable anteroseptal infarct, old Minimal ST depression, anterior leads Baseline wander in lead(s) V4 No significant change since last tracing Confirmed by Littleton Haub  MD, Nikolos Billig ([id number]) on 02/07/2016 8:25:31 AM       Radiology Dg Chest Portable 1 View  Result Date: 02/07/2016 CLINICAL DATA:  CHEST PAIN,HX HTN,DM,COPD,CHF,RENAL DZ EXAM: PORTABLE CHEST - 1 VIEW COMPARISON:  10/23/2014 FINDINGS: Low lung volumes.  No focal infiltrate or  overt edema. Heart size upper limits normal for technique. Atheromatous aortic arch. No effusion. Visualized bones unremarkable. IMPRESSION: 1. Low volumes.  No acute disease. 2.  Aortic Atherosclerosis (ICD10-170.0) Electronically Signed   By: D  Hassell M.D.   On: 09/29/Corlis Leak2017 12:11    Procedures Procedures (including critical care time)  Medications Ordered in ED Medications  cefTRIAXone (ROCEPHIN) 1 g in dextrose 5 % 50 mL IVPB (not administered)  sodium chloride 0.9 % bolus 1,000 mL (0 mLs Intravenous Stopped 02/07/16 1134)  metoCLOPramide (REGLAN) injection 10 mg (10 mg Intravenous Given 02/07/16 1004)  prochlorperazine (COMPAZINE) injection 10 mg (10 mg Intramuscular Given 02/07/16 0904)  morphine 2 MG/ML injection 2 mg (2 mg Intravenous Given 02/07/16 1159)  promethazine (PHENERGAN) injection 12.5 mg ( Intravenous Given 02/07/16 1200)     Initial Impression / Assessment and Plan / ED Course  I have reviewed the triage vital signs and the nursing notes.  Pertinent labs & imaging results that were available during my care of the patient were reviewed by me and considered in my medical decision making (see chart for details).  Clinical Course  Discussed with renal and with the hospitalist service.  Patient will be admitted to the hospitalist service.  She appears improved after fluids and antiemetics.    Final Clinical Impressions(s) / ED Diagnoses   Final diagnoses:  Intractable vomiting with nausea, vomiting of unspecified type  Renal failure    New Prescriptions New Prescriptions   No medications on file     Nelva Nayobert Derric Dealmeida, MD 02/07/16 1319    Nelva Nayobert Alese Furniss, MD 04/22/16 [phone]

## 2016-02-08 ENCOUNTER — Inpatient Hospital Stay (HOSPITAL_COMMUNITY): Payer: Medicaid Other

## 2016-02-08 ENCOUNTER — Encounter (HOSPITAL_COMMUNITY): Payer: Self-pay | Admitting: *Deleted

## 2016-02-08 DIAGNOSIS — R1110 Vomiting, unspecified: Secondary | ICD-10-CM

## 2016-02-08 DIAGNOSIS — E1100 Type 2 diabetes mellitus with hyperosmolarity without nonketotic hyperglycemic-hyperosmolar coma (NKHHC): Secondary | ICD-10-CM

## 2016-02-08 DIAGNOSIS — E1110 Type 2 diabetes mellitus with ketoacidosis without coma: Secondary | ICD-10-CM | POA: Diagnosis present

## 2016-02-08 LAB — HEPATIC FUNCTION PANEL
ALBUMIN: 3.7 g/dL (ref 3.5–5.0)
ALT: 20 U/L (ref 14–54)
AST: 33 U/L (ref 15–41)
Alkaline Phosphatase: 169 U/L — ABNORMAL HIGH (ref 38–126)
BILIRUBIN TOTAL: 0.5 mg/dL (ref 0.3–1.2)
Total Protein: 7.3 g/dL (ref 6.5–8.1)

## 2016-02-08 LAB — CBC
HCT: 38 % (ref 36.0–46.0)
Hemoglobin: 12.7 g/dL (ref 12.0–15.0)
MCH: 30.5 pg (ref 26.0–34.0)
MCHC: 33.4 g/dL (ref 30.0–36.0)
MCV: 91.1 fL (ref 78.0–100.0)
PLATELETS: 171 10*3/uL (ref 150–400)
RBC: 4.17 MIL/uL (ref 3.87–5.11)
RDW: 15.2 % (ref 11.5–15.5)
WBC: 11.3 10*3/uL — AB (ref 4.0–10.5)

## 2016-02-08 LAB — GLUCOSE, CAPILLARY
GLUCOSE-CAPILLARY: 400 mg/dL — AB (ref 65–99)
GLUCOSE-CAPILLARY: 273 mg/dL — AB (ref 65–99)
GLUCOSE-CAPILLARY: 277 mg/dL — AB (ref 65–99)
GLUCOSE-CAPILLARY: 62 mg/dL — AB (ref 65–99)
GLUCOSE-CAPILLARY: 97 mg/dL (ref 65–99)
GLUCOSE-CAPILLARY: 238 mg/dL — AB (ref 65–99)
Glucose-Capillary: 452 mg/dL — ABNORMAL HIGH (ref 65–99)
Glucose-Capillary: 485 mg/dL — ABNORMAL HIGH (ref 65–99)
Glucose-Capillary: 481 mg/dL — ABNORMAL HIGH (ref 65–99)
Glucose-Capillary: 502 mg/dL (ref 65–99)
Glucose-Capillary: 300 mg/dL — ABNORMAL HIGH (ref 65–99)
Glucose-Capillary: 260 mg/dL — ABNORMAL HIGH (ref 65–99)
Glucose-Capillary: 171 mg/dL — ABNORMAL HIGH (ref 65–99)
Glucose-Capillary: 108 mg/dL — ABNORMAL HIGH (ref 65–99)
Glucose-Capillary: 247 mg/dL — ABNORMAL HIGH (ref 65–99)

## 2016-02-08 LAB — BASIC METABOLIC PANEL
ANION GAP: 15 (ref 5–15)
ANION GAP: 13 (ref 5–15)
ANION GAP: 14 (ref 5–15)
Anion gap: 20 — ABNORMAL HIGH (ref 5–15)
BUN: 27 mg/dL — ABNORMAL HIGH (ref 6–20)
BUN: 28 mg/dL — AB (ref 6–20)
BUN: 31 mg/dL — ABNORMAL HIGH (ref 6–20)
BUN: 32 mg/dL — ABNORMAL HIGH (ref 6–20)
CHLORIDE: 100 mmol/L — AB (ref 101–111)
CHLORIDE: 100 mmol/L — AB (ref 101–111)
CHLORIDE: 99 mmol/L — AB (ref 101–111)
CO2: 15 mmol/L — ABNORMAL LOW (ref 22–32)
CO2: 23 mmol/L (ref 22–32)
CO2: 20 mmol/L — AB (ref 22–32)
CO2: 22 mmol/L (ref 22–32)
CREATININE: 3.81 mg/dL — AB (ref 0.44–1.00)
Calcium: 10.2 mg/dL (ref 8.9–10.3)
Calcium: 10 mg/dL (ref 8.9–10.3)
Calcium: 10 mg/dL (ref 8.9–10.3)
Calcium: 10.1 mg/dL (ref 8.9–10.3)
Chloride: 96 mmol/L — ABNORMAL LOW (ref 101–111)
Creatinine, Ser: 3.88 mg/dL — ABNORMAL HIGH (ref 0.44–1.00)
Creatinine, Ser: 3.97 mg/dL — ABNORMAL HIGH (ref 0.44–1.00)
Creatinine, Ser: 4.12 mg/dL — ABNORMAL HIGH (ref 0.44–1.00)
GFR calc Af Amer: 13 mL/min — ABNORMAL LOW (ref >60)
GFR calc Af Amer: 13 mL/min — ABNORMAL LOW (ref >60)
GFR, EST AFRICAN AMERICAN: 14 mL/min — AB (ref >60)
GFR, EST AFRICAN AMERICAN: 14 mL/min — AB (ref >60)
GFR, EST NON AFRICAN AMERICAN: 12 mL/min — AB (ref >60)
GFR, EST NON AFRICAN AMERICAN: 12 mL/min — AB (ref >60)
GFR, EST NON AFRICAN AMERICAN: 11 mL/min — AB (ref >60)
GFR, EST NON AFRICAN AMERICAN: 11 mL/min — AB (ref >60)
GLUCOSE: 131 mg/dL — AB (ref 65–99)
GLUCOSE: 104 mg/dL — AB (ref 65–99)
Glucose, Bld: 466 mg/dL — ABNORMAL HIGH (ref 65–99)
Glucose, Bld: 469 mg/dL — ABNORMAL HIGH (ref 65–99)
POTASSIUM: 4.1 mmol/L (ref 3.5–5.1)
POTASSIUM: 3.7 mmol/L (ref 3.5–5.1)
POTASSIUM: 4.4 mmol/L (ref 3.5–5.1)
Potassium: 5.7 mmol/L — ABNORMAL HIGH (ref 3.5–5.1)
SODIUM: 135 mmol/L (ref 135–145)
SODIUM: 134 mmol/L — AB (ref 135–145)
Sodium: 134 mmol/L — ABNORMAL LOW (ref 135–145)
Sodium: 134 mmol/L — ABNORMAL LOW (ref 135–145)

## 2016-02-08 LAB — LIPID PANEL
CHOLESTEROL: 173 mg/dL (ref 0–200)
HDL: 71 mg/dL (ref >40)
LDL Cholesterol: 83 mg/dL (ref 0–99)
TRIGLYCERIDES: 93 mg/dL (ref <150)
Total CHOL/HDL Ratio: 2.4 RATIO
VLDL: 19 mg/dL (ref 0–40)

## 2016-02-08 LAB — BLOOD GAS, VENOUS
Acid-Base Excess: 1.4 mmol/L (ref 0.0–2.0)
Bicarbonate: 25.3 mmol/L (ref 20.0–28.0)
O2 Saturation: 74.2 %
PCO2 VEN: 39.1 mmHg — AB (ref 44.0–60.0)
PO2 VEN: 35.3 mmHg (ref 32.0–45.0)
Patient temperature: 98.6
pH, Ven: 7.427 (ref 7.250–7.430)

## 2016-02-08 LAB — MRSA PCR SCREENING: MRSA BY PCR: NEGATIVE

## 2016-02-08 LAB — T4, FREE: Free T4: 0.93 ng/dL (ref 0.61–1.12)

## 2016-02-08 LAB — LIPASE, BLOOD: LIPASE: 1065 U/L — AB (ref 11–51)

## 2016-02-08 LAB — TROPONIN I
Troponin I: 0.03 ng/mL (ref <0.03)
Troponin I: 0.05 ng/mL (ref <0.03)
Troponin I: 0.03 ng/mL (ref <0.03)

## 2016-02-08 LAB — TSH: TSH: 0.732 u[IU]/mL (ref 0.350–4.500)

## 2016-02-08 MED ORDER — DEXTROSE 50 % IV SOLN
15.0000 mL | Freq: Once | INTRAVENOUS | Status: AC
  Administered 2016-02-08: 15 mL via INTRAVENOUS

## 2016-02-08 MED ORDER — DEXTROSE-NACL 5-0.45 % IV SOLN
INTRAVENOUS | Status: DC
  Administered 2016-02-08: 15:00:00 via INTRAVENOUS

## 2016-02-08 MED ORDER — SODIUM CHLORIDE 0.9 % IV SOLN
INTRAVENOUS | Status: DC
  Administered 2016-02-08: 3.4 [IU]/h via INTRAVENOUS
  Filled 2016-02-08: qty 2.5

## 2016-02-08 MED ORDER — INSULIN ASPART 100 UNIT/ML ~~LOC~~ SOLN
0.0000 [IU] | SUBCUTANEOUS | Status: DC
  Administered 2016-02-08: 5 [IU] via SUBCUTANEOUS
  Administered 2016-02-08: 3 [IU] via SUBCUTANEOUS
  Administered 2016-02-09 (×2): 5 [IU] via SUBCUTANEOUS
  Administered 2016-02-09: 1 [IU] via SUBCUTANEOUS
  Administered 2016-02-09: 3 [IU] via SUBCUTANEOUS
  Administered 2016-02-09: 2 [IU] via SUBCUTANEOUS
  Administered 2016-02-10 (×2): 1 [IU] via SUBCUTANEOUS
  Administered 2016-02-10: 5 [IU] via SUBCUTANEOUS
  Administered 2016-02-11 (×3): 2 [IU] via SUBCUTANEOUS
  Administered 2016-02-11: 1 [IU] via SUBCUTANEOUS
  Administered 2016-02-11: 7 [IU] via SUBCUTANEOUS
  Administered 2016-02-11: 1 [IU] via SUBCUTANEOUS
  Administered 2016-02-12 (×2): 2 [IU] via SUBCUTANEOUS
  Administered 2016-02-12: 3 [IU] via SUBCUTANEOUS

## 2016-02-08 MED ORDER — INSULIN ASPART 100 UNIT/ML ~~LOC~~ SOLN: 0.0000 [IU] | Freq: Every day | SUBCUTANEOUS | Status: DC

## 2016-02-08 MED ORDER — SODIUM CHLORIDE 0.9 % IV SOLN
INTRAVENOUS | Status: DC
  Administered 2016-02-08: 10:00:00 via INTRAVENOUS

## 2016-02-08 MED ORDER — INSULIN GLARGINE 100 UNIT/ML ~~LOC~~ SOLN
15.0000 [IU] | Freq: Every day | SUBCUTANEOUS | Status: DC
  Administered 2016-02-08: 15 [IU] via SUBCUTANEOUS
  Filled 2016-02-08 (×2): qty 0.15

## 2016-02-08 MED ORDER — INSULIN ASPART 100 UNIT/ML ~~LOC~~ SOLN: 0.0000 [IU] | SUBCUTANEOUS | Status: DC

## 2016-02-08 MED ORDER — INSULIN ASPART 100 UNIT/ML ~~LOC~~ SOLN
0.0000 [IU] | Freq: Three times a day (TID) | SUBCUTANEOUS | Status: DC
  Administered 2016-02-08: 9 [IU] via SUBCUTANEOUS

## 2016-02-08 MED ORDER — INSULIN ASPART 100 UNIT/ML ~~LOC~~ SOLN
5.0000 [IU] | Freq: Once | SUBCUTANEOUS | Status: AC
  Administered 2016-02-08: 5 [IU] via SUBCUTANEOUS

## 2016-02-08 MED ORDER — MORPHINE SULFATE (PF) 2 MG/ML IV SOLN
2.0000 mg | INTRAVENOUS | Status: DC | PRN
  Administered 2016-02-08 (×2): 2 mg via INTRAVENOUS
  Filled 2016-02-08 (×2): qty 1

## 2016-02-08 MED ORDER — SODIUM CHLORIDE 0.9 % IV SOLN: INTRAVENOUS | Status: AC

## 2016-02-08 MED ORDER — INSULIN GLARGINE 100 UNIT/ML ~~LOC~~ SOLN
20.0000 [IU] | Freq: Every day | SUBCUTANEOUS | Status: DC
  Filled 2016-02-08: qty 0.2

## 2016-02-08 MED ORDER — INSULIN ASPART 100 UNIT/ML ~~LOC~~ SOLN
8.0000 [IU] | Freq: Once | SUBCUTANEOUS | Status: AC
  Administered 2016-02-08: 8 [IU] via SUBCUTANEOUS

## 2016-02-08 MED ORDER — DEXTROSE 50 % IV SOLN
INTRAVENOUS | Status: AC
  Filled 2016-02-08: qty 50

## 2016-02-08 NOTE — Progress Notes (Signed)
Phlebotomist notified of order to be able to draw labs via foot stick.

## 2016-02-08 NOTE — Progress Notes (Signed)
Spot checked pt's blood sugar at 0520 it's 485. M. Lynch notified w/ the order to give 5units Novolog and check CBG again before breakfast.

## 2016-02-08 NOTE — Progress Notes (Signed)
CRITICAL VALUE ALERT  Critical value received:  Troponin 0.03  Date of notification:  02/08/2016  Time of notification:  1014  Critical value read back:Yes.    Nurse who received alert:  Herta Hink  MD notified (1st Cozza):  1015  Time of first Coco:  In person at [id number].  MD notified (2nd Trainer):  Time of second Poullard:  Responding MD:  Jeanella AntonAbrol, N.  Time MD responded:  1015 as MD came into patient's room.

## 2016-02-08 NOTE — Progress Notes (Signed)
Rt attempted ABG two times in right radial with no success. Charge RT called to attempt to collect.

## 2016-02-08 NOTE — Progress Notes (Signed)
Dr. Susie CassetteAbrol notified that last 2 blood specimens for BMP were hemolyzed.  Also notified of cbg 62. Glucostabilizer instructed to turn off insulin drip and administer 15 cc D50. Orders received.  Will continue to monitor pt closely.

## 2016-02-08 NOTE — Progress Notes (Signed)
Inpatient Diabetes Program Recommendations  AACE/ADA: New Consensus Statement on Inpatient Glycemic Control (2015)  Target Ranges:  Prepandial:   less than 140 mg/dL      Peak postprandial:   less than 180 mg/dL (1-2 hours)      Critically ill patients:  140 - 180 mg/dL   Lab Results  Component Value Date   GLUCAP 502 (HH) 02/08/2016   HGBA1C 15.5 (H) 10/13/2014   Review of Glycemic Control  Diabetes history: DM 2 Outpatient Diabetes medications: Lantus 10 units, Humalog 3-10 units TID Current orders for Inpatient glycemic control: IV insulin  Inpatient Diabetes Program Recommendations:   Patient has DM type II. Patient went into DKA overnight. Insulin was not able to be given initially. Patient will need basal insulin when she transitions off IV insulin once glucose normalizes and acidosis clears. Consider ordering Lantus 8-10 units Q24 hours, Novolog Sensitive TID (due to renal function) + HS scale.  Would air on the side of caution with elevated lipase.  Please order A1c to be drawn to evaluate glucose trends over the past 2-3 months. Will result beginning of next week.  Thanks,  Christena DeemShannon Joby Richart RN, MSN, [facility] Inpatient Diabetes Coordinator Team Pager [phone] (8a-5p)

## 2016-02-08 NOTE — Progress Notes (Signed)
Late entry.  Dr Susie CassetteAbrol notified of ac breakfast CBG report and persistent high blood sugar since last night.  Patient transferred to stepdown unit .

## 2016-02-08 NOTE — Progress Notes (Addendum)
[facility] PROGRESS NOTE  Madeline [name] VOZ:[id number]:[id number] DOB: [birth year] DOA: 02/07/2016   PCP: [name],[name], MD     Assessment/Plan: Active Problems:   Diabetes mellitus type II, uncontrolled (HCC)   Anemia of chronic renal failure   Emesis, persistent   Uncontrollable vomiting   60 y.o. female, With past medical history relevant for end-stage renal disease with hemodialysis on Mondays Wednesdays and Fridays, hypertension diabetes and history of pulmonary hypertension who is usually noncompliant with her hemodialysis sessions presents today with intractable emesis of less than 24 hours duration. No fever no chills no diarrhea no sick contacts at home. Last dialysis session was 02/05/2016 patient apparently left her hemodialysis session less than 3 hours into a 4 session. No syncope. No chest pain she does have some dizziness but no palpitations  In ED- appears lethargic, slow to answer questions. Per report of brother, patient awakened with abdominal pain, nausea & vomiting. Per ED note, patient had 10/10 chest pain relieved .received IV Rocephin pending cultures  Assessment and plan 1)Intractable emesis-DKA?acute pancreatitis   no acute abdominal findings, treat with when necessary antiemetics, could be secondary to urinary tract infection, check liver function , lipase 1065. CT abdomen pelvis to rule out pancreatitis   2) Possible UTI- this may explain #1 above, treat empirically with IV Rocephin pending cultures, patient is not septic. No CVA area tenderness  3)ESRD with hemodialysis on Mondays Wednesdays and Fridays- patient is notoriously noncompliant with her hemodialysis sessions, last hemodialysis session was 02/05/2016 patient actually left AMA less than 3 hours into a 4 hr session . Get nephrology consult for hemodialysis today  4)HTN- resume home meds, compliance advised,   may use IV labetalol when necessary  Every 4 hours for systolic blood pressure over  425160 mmhg  5)DM- patient was just placed on Novolog/Humalog Sliding scale insulin with Accu-Cheks/Fingersticks as ordered, Lantus was not started on admission  . Will check ABG to rule out DKA. Noted that anion gap was 16 and bicarbonate was 20 yesterday. Bicarbonate has steadily gone down to 15. May need to initiate insulin drip per DKA protocol for stabilization of CBGs. a1c 15.5.   DVT prophylaxsis heparin   Code Status:  Full code    Family Communication: Discussed in detail with the patient, all imaging results, lab results explained to the patient   Disposition Plan:  May need to initiate insulin gtt       Consultants:  nephrology  Procedures:  None     Antibiotics: Anti-infectives    Start     Dose/Rate Route Frequency Ordered Stop   02/07/16 1315  cefTRIAXone (ROCEPHIN) 1 g in dextrose 5 % 50 mL IVPB     1 g 100 mL/hr over 30 Minutes Intravenous Every 24 hours 02/07/16 1314           HPI/Subjective: Somnolent ,unarousable  Objective: Vitals:   02/07/16 2000 02/07/16 2020 02/07/16 2108 02/08/16 0357  BP: (!) 161/77 (!) 174/64 130/78 133/64  Pulse: 81 83 87 89  Resp: 18 18 19 19   Temp:  98.8 F (37.1 C) 99.2 F (37.3 C) 98.9 F (37.2 C)  TempSrc:  Oral Oral Oral  SpO2:   100% 100%  Weight:   82 kg (180 lb 12.4 oz)   Height:   4\' 9"  (1.448 m)     Intake/Output Summary (Last 24 hours) at 02/08/16 [id number] Last data filed at 02/08/16 0400  Gross per 24 hour  Intake  100 ml  Output             2500 ml  Net            -2400 ml    Exam:  Examination:  General exam: Somnolent ,unarousable Respiratory system: Clear to auscultation. Respiratory effort normal. Cardiovascular system: S1 & S2 heard, RRR. No JVD, murmurs, rubs, gallops or clicks. No pedal edema. Gastrointestinal system: Abdomen is nondistended, soft and nontender. No organomegaly or masses felt. Normal bowel sounds heard. Central nervous system: unable to assess   Extremities: Symmetric 5 x 5 power. Skin: No rashes, lesions or ulcers Psychiatry: Judgement and insight appear normal. Mood & affect appropriate.     Data Reviewed: I have personally reviewed following labs and imaging studies  Micro Results No results found for this or any previous visit (from the past 240 hour(s)).  Radiology Reports Dg Chest Portable 1 View  Result Date: 02/07/2016 CLINICAL DATA:  CHEST PAIN,HX HTN,DM,COPD,CHF,RENAL DZ EXAM: PORTABLE CHEST - 1 VIEW COMPARISON:  10/23/2014 FINDINGS: Low lung volumes.  No focal infiltrate or overt edema. Heart size upper limits normal for technique. Atheromatous aortic arch. No effusion. Visualized bones unremarkable. IMPRESSION: 1. Low volumes.  No acute disease. 2.  Aortic Atherosclerosis (ICD10-170.0) Electronically Signed   By: Corlis Leak M.D.   On: 02/07/2016 12:11     CBC  Recent Labs Lab 02/04/16 1340 02/07/16 1000 02/08/16 0625  WBC 4.1 5.6 11.3*  HGB 11.2* 12.2 12.7  HCT 36.3 38.5 38.0  PLT 129* 129* 171  MCV 94.5 93.7 91.1  MCH 29.2 29.7 30.5  MCHC 30.9* 31.7 33.4  RDW 14.4 14.4 15.2  LYMPHSABS 1,722 1.0  --   MONOABS 369 0.2  --   EOSABS 41 0.0  --   BASOSABS 0 0.0  --     Chemistries   Recent Labs Lab 02/04/16 1340 02/07/16 1000 02/07/16 1714 02/08/16 0625  NA 133* 135 136 135  K 4.4 4.3 4.9 5.7*  CL 96* 99* 100* 100*  CO2 25 20* 19* 15*  GLUCOSE 649* 484* 566* 466*  BUN 9 19 24* 27*  CREATININE 3.12* 4.24* 4.33* 3.81*  CALCIUM 9.2 9.5 9.3 10.2  AST 28 35  --   --   ALT 17  18 22   --   --   ALKPHOS 219* 181*  --   --   BILITOT 0.4 1.1  --   --    ------------------------------------------------------------------------------------------------------------------ estimated creatinine clearance is 14.2 mL/min (by C-G formula based on SCr of 3.81 mg/dL (H)). ------------------------------------------------------------------------------------------------------------------ No results for  input(s): HGBA1C in the last 72 hours. ------------------------------------------------------------------------------------------------------------------ No results for input(s): CHOL, HDL, LDLCALC, TRIG, CHOLHDL, LDLDIRECT in the last 72 hours. ------------------------------------------------------------------------------------------------------------------ No results for input(s): TSH, T4TOTAL, T3FREE, THYROIDAB in the last 72 hours.  Invalid input(s): FREET3 ------------------------------------------------------------------------------------------------------------------ No results for input(s): VITAMINB12, FOLATE, FERRITIN, TIBC, IRON, RETICCTPCT in the last 72 hours.  Coagulation profile  Recent Labs Lab 02/04/16 1340  INR 1.1    No results for input(s): DDIMER in the last 72 hours.  Cardiac Enzymes No results for input(s): CKMB, TROPONINI, MYOGLOBIN in the last 168 hours.  Invalid input(s): CK ------------------------------------------------------------------------------------------------------------------ Invalid input(s): POCBNP   CBG:  Recent Labs Lab 02/07/16 2023 02/07/16 2141 02/08/16 0106 02/08/16 0520 02/08/16 0746  GLUCAP 284* 349* 452* 485* 481*       Studies: Dg Chest Portable 1 View  Result Date: 02/07/2016 CLINICAL DATA:  CHEST PAIN,HX HTN,DM,COPD,CHF,RENAL DZ EXAM: PORTABLE CHEST - 1  VIEW COMPARISON:  10/23/2014 FINDINGS: Low lung volumes.  No focal infiltrate or overt edema. Heart size upper limits normal for technique. Atheromatous aortic arch. No effusion. Visualized bones unremarkable. IMPRESSION: 1. Low volumes.  No acute disease. 2.  Aortic Atherosclerosis (ICD10-170.0) Electronically Signed   By: Corlis Leak M.D.   On: 02/07/2016 12:11      Lab Results  Component Value Date   HGBA1C 15.5 (H) 10/13/2014   HGBA1C 12.3 (H) 01/07/2014   HGBA1C 7.7 (H) 02/14/2013   Lab Results  Component Value Date   CREATININE 3.81 (H) 02/08/2016        Scheduled Meds: . cefTRIAXone (ROCEPHIN)  IV  1 g Intravenous Q24H  . heparin  5,000 Units Subcutaneous Q8H  . insulin aspart  0-5 Units Subcutaneous QHS  . insulin aspart  0-9 Units Subcutaneous Q4H  . insulin glargine  20 Units Subcutaneous Daily  . ondansetron (ZOFRAN) IV  4 mg Intravenous Q6H  . senna  1 tablet Oral BID  . sodium chloride flush  3 mL Intravenous Q12H   Continuous Infusions:    LOS: 1 day    Time spent: >30 MINS    [facility]  [facility] Pager [phone]. If 7PM-7AM, please contact night-coverage at [email or web address], password [facility] 02/08/2016, 8:21 AM  LOS: 1 day

## 2016-02-08 NOTE — Progress Notes (Signed)
  [facility] Progress Note   Assessment/ Plan:     Dialysis Orders: [facility] MWF 4 hours 400/Auto 1.5 63 kg  3.0K/2.5 Ca UF profile 4 Heparin 4000 units IV q treatment Aranesp 40 mcg IV q week (should have gotten dose 02/05/16-last actual dose documented 09/20/017 HGB 12.0 02/05/16) Venofer 50 mg IV (last dose 02/05/16 fe101 Tsat 39% 02/05/16  1. DKA: Per primary BS persistently elevated in the 400s-500s , getting insulin gtt. 2. Possible pancreatitis: lipase in the 1000s, CT abd/ pelvis pending.  3. Abdominal pain/N & V: Improved with antiemetics. Per primary Likely due to #1 as above 4.  ESRD -  MWF [facility]. HD today on schedule. Use 3.0 K bath. K+ 4.3 now usually 3.8 in-center.  5.  Hypertension/volume  -Extremely hypertensive in ED BP 199/72 presently. Takes coreg 12.5 mg PO BID per OP med list. Leaving slightly under EDW. No edema. Attempted 2-3 liters. 6.  Anemia  -HGB 12.2. No ESA needed. Follow HGB 7.  Metabolic bone disease -  Hold binders-last phos 2.9 02/05/16, Cont VDRA. Ca 9.5 C Ca 9.74 PTH 366 02/05/16 8.  Nutrition - NPO at present. Renal/Carb mod diet when able to eat. 9. AMS: MRI brain without acute abnormality.  Possibly due to DKA/pancreatitis/phenergan  Subjective:    Transferred to SD for insulin gtt.  More alert this Am.     Objective:   BP (!) 146/46   Pulse 78   Temp 98.7 F (37.1 C) (Oral)   Resp (!) 21   Ht 4\' 9"  (1.448 m)   Wt 82.1 kg (181 lb)   SpO2 100%   BMI 39.17 kg/m   Physical Exam: General: chronically ill appearing female, opens eyes Head: Normocephalic, atraumatic, sclera non-icteric, mucus membranes are moist Neck: Supple. JVD not elevated. Lungs: Clear bilaterally to auscultation without wheezes, rales, or rhonchi. Breathing is unlabored. Heart: RRR with S1 S2. No murmurs, rubs, or gallops appreciated. SR on monitor. Abdomen: Soft, active bowel sounds, nontender Lower extremities:without edema or ischemic changes, no open  wounds  Neuro: alert, not able to hold a conversation with her dtr on the phone. Dialysis Access: LUA AVF +Bruit  Labs: BMET  Recent Labs Lab 02/04/16 1340 02/07/16 1000 02/07/16 1714 02/08/16 0625 02/08/16 0856  NA 133* 135 136 135 134*  K 4.4 4.3 4.9 5.7* 4.1  CL 96* 99* 100* 100* 96*  CO2 25 20* 19* 15* 23  GLUCOSE 649* 484* 566* 466* 469*  BUN 9 19 24* 27* 28*  CREATININE 3.12* 4.24* 4.33* 3.81* 3.88*  CALCIUM 9.2 9.5 9.3 10.2 10.0  PHOS  --   --  4.5  --   --    CBC  Recent Labs Lab 02/04/16 1340 02/07/16 1000 02/08/16 0625  WBC 4.1 5.6 11.3*  NEUTROABS 1,968 4.5  --   HGB 11.2* 12.2 12.7  HCT 36.3 38.5 38.0  MCV 94.5 93.7 91.1  PLT 129* 129* 171    @IMGRELPRIORS @ Medications:    . cefTRIAXone (ROCEPHIN)  IV  1 g Intravenous Q24H  . heparin  5,000 Units Subcutaneous Q8H  . ondansetron (ZOFRAN) IV  4 mg Intravenous Q6H  . senna  1 tablet Oral BID  . sodium chloride flush  3 mL Intravenous Q12H     Bufford ButtnerElizabeth Horst Ostermiller, MD [facility] Cell [phone] Pgr: [phone] 02/08/2016, 2:24 PM

## 2016-02-08 NOTE — Progress Notes (Signed)
[facility]. Blood sugar was 400. Insulin drip and normal saline started. Patient is lethargic and would answer only yes or no questions with eyes open. Denied any pain or discomfort. Will continue to monitor and call lab for VBG.

## 2016-02-08 NOTE — Progress Notes (Signed)
Paged Dr Melynda RippleHobbs 2x for Pt's CBG of 349. Still waiting for his Reply.

## 2016-02-09 ENCOUNTER — Inpatient Hospital Stay (HOSPITAL_COMMUNITY): Payer: Medicaid Other

## 2016-02-09 LAB — CBC
HCT: 43.8 % (ref 36.0–46.0)
HCT: 39.6 % (ref 36.0–46.0)
HEMATOCRIT: 39.9 % (ref 36.0–46.0)
HEMOGLOBIN: 13.9 g/dL (ref 12.0–15.0)
Hemoglobin: 12.7 g/dL (ref 12.0–15.0)
Hemoglobin: 12.4 g/dL (ref 12.0–15.0)
MCH: 29.7 pg (ref 26.0–34.0)
MCH: 29.6 pg (ref 26.0–34.0)
MCH: 29.5 pg (ref 26.0–34.0)
MCHC: 31.8 g/dL (ref 30.0–36.0)
MCHC: 31.7 g/dL (ref 30.0–36.0)
MCHC: 31.3 g/dL (ref 30.0–36.0)
MCV: 93.2 fL (ref 78.0–100.0)
MCV: 93.4 fL (ref 78.0–100.0)
MCV: 94.1 fL (ref 78.0–100.0)
PLATELETS: 155 10*3/uL (ref 150–400)
Platelets: 155 10*3/uL (ref 150–400)
Platelets: 147 10*3/uL — ABNORMAL LOW (ref 150–400)
RBC: 4.28 MIL/uL (ref 3.87–5.11)
RBC: 4.69 MIL/uL (ref 3.87–5.11)
RBC: 4.21 MIL/uL (ref 3.87–5.11)
RDW: 15.4 % (ref 11.5–15.5)
RDW: 15.4 % (ref 11.5–15.5)
RDW: 15.4 % (ref 11.5–15.5)
WBC: 10.7 10*3/uL — ABNORMAL HIGH (ref 4.0–10.5)
WBC: 9.5 10*3/uL (ref 4.0–10.5)
WBC: 10.1 10*3/uL (ref 4.0–10.5)

## 2016-02-09 LAB — HCV RNA, QUANT REAL-TIME PCR W/REFLEX
HCV RNA, PCR, QN (LOG): 7.46 {Log_IU}/mL — AB
HCV RNA, PCR, QN: 29100000 IU/mL — ABNORMAL HIGH

## 2016-02-09 LAB — GLUCOSE, CAPILLARY
GLUCOSE-CAPILLARY: 196 mg/dL — AB (ref 65–99)
GLUCOSE-CAPILLARY: 272 mg/dL — AB (ref 65–99)
GLUCOSE-CAPILLARY: 281 mg/dL — AB (ref 65–99)
Glucose-Capillary: 140 mg/dL — ABNORMAL HIGH (ref 65–99)
Glucose-Capillary: 145 mg/dL — ABNORMAL HIGH (ref 65–99)
Glucose-Capillary: 209 mg/dL — ABNORMAL HIGH (ref 65–99)
Glucose-Capillary: 277 mg/dL — ABNORMAL HIGH (ref 65–99)

## 2016-02-09 LAB — COMPREHENSIVE METABOLIC PANEL
ALBUMIN: 3.2 g/dL — AB (ref 3.5–5.0)
ALT: 20 U/L (ref 14–54)
ALT: 18 U/L (ref 14–54)
ANION GAP: 12 (ref 5–15)
AST: 33 U/L (ref 15–41)
AST: 45 U/L — AB (ref 15–41)
Albumin: 3.2 g/dL — ABNORMAL LOW (ref 3.5–5.0)
Alkaline Phosphatase: 150 U/L — ABNORMAL HIGH (ref 38–126)
Alkaline Phosphatase: 135 U/L — ABNORMAL HIGH (ref 38–126)
Anion gap: 13 (ref 5–15)
BILIRUBIN TOTAL: 0.6 mg/dL (ref 0.3–1.2)
BUN: 39 mg/dL — ABNORMAL HIGH (ref 6–20)
BUN: 48 mg/dL — AB (ref 6–20)
CHLORIDE: 103 mmol/L (ref 101–111)
CO2: 24 mmol/L (ref 22–32)
CO2: 22 mmol/L (ref 22–32)
CREATININE: 6.01 mg/dL — AB (ref 0.44–1.00)
Calcium: 9.6 mg/dL (ref 8.9–10.3)
Calcium: 9.3 mg/dL (ref 8.9–10.3)
Chloride: 101 mmol/L (ref 101–111)
Creatinine, Ser: 5.1 mg/dL — ABNORMAL HIGH (ref 0.44–1.00)
GFR calc Af Amer: 8 mL/min — ABNORMAL LOW (ref >60)
GFR, EST AFRICAN AMERICAN: 10 mL/min — AB (ref >60)
GFR, EST NON AFRICAN AMERICAN: 8 mL/min — AB (ref >60)
GFR, EST NON AFRICAN AMERICAN: 7 mL/min — AB (ref >60)
GLUCOSE: 162 mg/dL — AB (ref 65–99)
Glucose, Bld: 220 mg/dL — ABNORMAL HIGH (ref 65–99)
POTASSIUM: 3.8 mmol/L (ref 3.5–5.1)
POTASSIUM: 3.5 mmol/L (ref 3.5–5.1)
SODIUM: 138 mmol/L (ref 135–145)
Sodium: 137 mmol/L (ref 135–145)
TOTAL PROTEIN: 6.2 g/dL — AB (ref 6.5–8.1)
Total Bilirubin: 0.4 mg/dL (ref 0.3–1.2)
Total Protein: 6.2 g/dL — ABNORMAL LOW (ref 6.5–8.1)

## 2016-02-09 LAB — HEMOGLOBIN A1C
Hgb A1c MFr Bld: 11.7 % — ABNORMAL HIGH (ref 4.8–5.6)
Mean Plasma Glucose: 289 mg/dL

## 2016-02-09 LAB — URINE CULTURE: SPECIAL REQUESTS: NORMAL

## 2016-02-09 LAB — RENAL FUNCTION PANEL
ANION GAP: 13 (ref 5–15)
Albumin: 3.4 g/dL — ABNORMAL LOW (ref 3.5–5.0)
BUN: 45 mg/dL — ABNORMAL HIGH (ref 6–20)
CALCIUM: 9.5 mg/dL (ref 8.9–10.3)
CO2: 22 mmol/L (ref 22–32)
Chloride: 102 mmol/L (ref 101–111)
Creatinine, Ser: 5.39 mg/dL — ABNORMAL HIGH (ref 0.44–1.00)
GFR calc Af Amer: 9 mL/min — ABNORMAL LOW (ref >60)
GFR calc non Af Amer: 8 mL/min — ABNORMAL LOW (ref >60)
GLUCOSE: 308 mg/dL — AB (ref 65–99)
Phosphorus: 4.3 mg/dL (ref 2.5–4.6)
Potassium: 4.3 mmol/L (ref 3.5–5.1)
SODIUM: 137 mmol/L (ref 135–145)

## 2016-02-09 LAB — LIPASE, BLOOD: Lipase: 273 U/L — ABNORMAL HIGH (ref 11–51)

## 2016-02-09 LAB — HCV RNA,LIPA RFLX NS5A DRUG RESIST

## 2016-02-09 MED ORDER — LIDOCAINE HCL (PF) 1 % IJ SOLN: 5.0000 mL | INTRAMUSCULAR | Status: DC | PRN

## 2016-02-09 MED ORDER — SODIUM CHLORIDE 0.9 % IV SOLN: 100.0000 mL | INTRAVENOUS | Status: DC | PRN

## 2016-02-09 MED ORDER — LIDOCAINE-PRILOCAINE 2.5-2.5 % EX CREA: 1.0000 "application " | TOPICAL_CREAM | CUTANEOUS | Status: DC | PRN

## 2016-02-09 MED ORDER — INSULIN GLARGINE 100 UNIT/ML ~~LOC~~ SOLN
3.0000 [IU] | Freq: Once | SUBCUTANEOUS | Status: AC
  Administered 2016-02-09: 3 [IU] via SUBCUTANEOUS
  Filled 2016-02-09: qty 0.03

## 2016-02-09 MED ORDER — SUCRALFATE 1 G PO TABS
1.0000 g | ORAL_TABLET | Freq: Three times a day (TID) | ORAL | Status: DC
  Administered 2016-02-09 – 2016-02-12 (×11): 1 g via ORAL
  Filled 2016-02-09 (×12): qty 1

## 2016-02-09 MED ORDER — INSULIN GLARGINE 100 UNIT/ML ~~LOC~~ SOLN
18.0000 [IU] | Freq: Every day | SUBCUTANEOUS | Status: DC
Start: 2016-02-09 — End: 2016-02-10
  Administered 2016-02-09: 18 [IU] via SUBCUTANEOUS
  Filled 2016-02-09 (×3): qty 0.18

## 2016-02-09 MED ORDER — ALTEPLASE 2 MG IJ SOLR: 2.0000 mg | Freq: Once | INTRAMUSCULAR | Status: DC | PRN

## 2016-02-09 MED ORDER — HEPARIN SODIUM (PORCINE) 1000 UNIT/ML DIALYSIS
1000.0000 [IU] | INTRAMUSCULAR | Status: DC | PRN
  Filled 2016-02-09: qty 1

## 2016-02-09 MED ORDER — PANTOPRAZOLE SODIUM 40 MG PO TBEC
40.0000 mg | DELAYED_RELEASE_TABLET | Freq: Every day | ORAL | Status: DC
  Administered 2016-02-09 – 2016-02-12 (×4): 40 mg via ORAL
  Filled 2016-02-09 (×4): qty 1

## 2016-02-09 MED ORDER — NALOXONE HCL 0.4 MG/ML IJ SOLN
0.4000 mg | INTRAMUSCULAR | Status: DC | PRN
  Administered 2016-02-09: 0.4 mg via INTRAVENOUS
  Filled 2016-02-09: qty 1

## 2016-02-09 MED ORDER — PENTAFLUOROPROP-TETRAFLUOROETH EX AERO: 1.0000 "application " | INHALATION_SPRAY | CUTANEOUS | Status: DC | PRN

## 2016-02-09 MED ORDER — NA FERRIC GLUC CPLX IN SUCROSE 12.5 MG/ML IV SOLN
62.5000 mg | INTRAVENOUS | Status: DC
  Administered 2016-02-12: 62.5 mg via INTRAVENOUS
  Filled 2016-02-09 (×3): qty 5

## 2016-02-09 NOTE — Significant Event (Addendum)
Rapid Response Event Note       Called by Eddie CandleAngelo, RN for poorly responsive pt.  At 1900 pt was awake and conversant in bed, when seen by nurse tech at 2130 pt was unchanged but at 2145 when RN went to bedside pt was responsive only to sternal rub.  CBG 169. Has received no narcotics or sedatives today. Last HD 02/08/2016  Overview: Time Called: 2145 Arrival Time: 2147 Event Type: Neurologic, Respiratory  Initial Focused Assessment:    Upon my arrive to room pt lying in bed, diaphoretic skin cool to touch., somnolent, awakens to sternal rub, moans and falls back to sleep.  Extremeties flacccid x 4, Pupils PERL 2 mm,     BP 121/38 SR 71  RA sats 100% RR 12 with frequent periods of apnea and  occasional sonorous respirations.   Bilateral Breath sounds clear, Shallow respirations.   Interventions:  EKG: NSR 66 , no acute findings,    ABG on 2l [place]  7.4  PCO2 38   PO2 150  HCO3 23.5  CXR: NAD        Narcan 0.4mg  IV with no effect                Rectal temp 99.5   CBC WNL         BMET :  BUN 48 Creat 6.01, Anion gap 13,      Head CT --results pending. Phone call to Tama GanderKatherine Schorr, NP who would have MD sent to bedside.  Dr Maryfrances Bunnellanford at bedside at 2300 hrs requested Head CT and agreed to transfer to SDU for closer obvservation , airway protection and BIPAP if necessary.  Pt was transferred to 3S13, report was given to staff by Yong ChannelAngelo, 6N RN.  I updated family at bedside who then accompanied pt to 3S13,  Will continue to follow as necessary  Plan of Care (if not transferred):  Event Summary: Name of Physician Notified: Tama GanderKatherine Schorr , NP at 2310  Name of Consulting Physician Notified: Dr Joen Laurahristopher Danford at    Outcome: Transferred (Comment) [phone]([id number]) at 2345   0020:  F/U Head CT reults : No acute intracranial abnormality  Hayze Gazda, Sheffield SliderPaula K

## 2016-02-09 NOTE — Progress Notes (Addendum)
[facility] PROGRESS NOTE  Madeline [name] ZOX:[id number] DOB: May 22, 1955 DOA: 02/07/2016   PCP: Pamelia Hoit, MD     Assessment/Plan: Active Problems:   Diabetes mellitus type II, uncontrolled (HCC)   Anemia of chronic renal failure   Emesis, persistent   Uncontrollable vomiting   DKA (diabetic ketoacidosis) (HCC)   60 y.o. female, With past medical history relevant for end-stage renal disease with hemodialysis on Mondays Wednesdays and Fridays, hypertension diabetes and history of pulmonary hypertension who is usually noncompliant with her hemodialysis sessions presents today with intractable emesis of less than 24 hours duration. No fever no chills no diarrhea no sick contacts at home. Last dialysis session was 02/05/2016 patient apparently left her hemodialysis session less than 3 hours into a 4 session. No syncope. No chest pain she does have some dizziness but no palpitations  In ED- appears lethargic, slow to answer questions. Per report of brother, patient awakened with abdominal pain, nausea & vomiting. Per ED note, patient had 10/10 chest pain relieved .received IV Rocephin pending cultures  Assessment and plan 1)Intractable emesis- improving, CT scan did show acute pancreatitis, no definite pseudocyst, patient also developed DKA which has resolved, also suspected to have mild urinary tract infection Liver Function normal with the exception of alkaline phosphatase , lipase 1065. Continue clear liquid diet for now, PPI for reflux   2) Possible UTI-urine culture pending, continue IV Rocephin pending cultures, patient is not septic. No CVA area tenderness  3)ESRD with hemodialysis on Mondays Wednesdays and Fridays- patient is notoriously noncompliant with her hemodialysis sessions, last hemodialysis session was 02/08/2016    4)HTN- resume home meds, compliance advised,   may use IV labetalol when necessary  Every 4 hours for systolic blood pressure over 160  mmhg  5)DM- patient was just placed on Novolog/Humalog Sliding scale insulin with Accu-Cheks/Fingersticks as ordered,   VBG to rule out DKA was ordered but not done. Anion gap peaked at 20, now 12. Bicarbonate 24. Accu-Cheks improved, increase Lantus to 18 units   DVT prophylaxsis heparin   Code Status:  Full code    Family Communication: Discussed in detail with the patient, all imaging results, lab results explained to the patient   Disposition Plan:  Transfer to telemetry      Consultants:  nephrology  Procedures:  None     Antibiotics: Anti-infectives    Start     Dose/Rate Route Frequency Ordered Stop   02/07/16 1315  cefTRIAXone (ROCEPHIN) 1 g in dextrose 5 % 50 mL IVPB     1 g 100 mL/hr over 30 Minutes Intravenous Every 24 hours 02/07/16 1314           HPI/Subjective: More awake ,complaining of heart burn   Objective: Vitals:   02/08/16 2000 02/08/16 2350 02/09/16 0401 02/09/16 0757  BP: (!) 167/70 (!) 110/40 (!) 155/66 (!) 164/87  Pulse: 77  74 72  Resp: 18  15 12   Temp: 99.8 F (37.7 C) 98 F (36.7 C) 98.2 F (36.8 C) 99.3 F (37.4 C)  TempSrc: Oral Oral Oral Oral  SpO2: 100%  100% 99%  Weight:   82.6 kg (182 lb 1.6 oz)   Height:        Intake/Output Summary (Last 24 hours) at 02/09/16 0827 Last data filed at 02/08/16 2000  Gross per 24 hour  Intake           574.35 ml  Output  0 ml  Net           574.35 ml    Exam:  Examination:  General exam: NAD Respiratory system: Clear to auscultation. Respiratory effort normal. Cardiovascular system: S1 & S2 heard, RRR. No JVD, murmurs, rubs, gallops or clicks. No pedal edema. Gastrointestinal system: Abdomen is nondistended, soft and nontender. No organomegaly or masses felt. Normal bowel sounds heard. Central nervous system: unable to assess  Extremities: Symmetric 5 x 5 power. Skin: No rashes, lesions or ulcers Psychiatry: Judgement and insight appear normal. Mood & affect  appropriate.     Data Reviewed: I have personally reviewed following labs and imaging studies  Micro Results Recent Results (from the past 240 hour(s))  MRSA PCR Screening     Status: None   Collection Time: 02/08/16  9:59 AM  Result Value Ref Range Status   MRSA by PCR NEGATIVE NEGATIVE Final    Comment:        The GeneXpert MRSA Assay (FDA approved for NASAL specimens only), is one component of a comprehensive MRSA colonization surveillance program. It is not intended to diagnose MRSA infection nor to guide or monitor treatment for MRSA infections.     Radiology Reports Ct Abdomen Pelvis Wo Contrast  Result Date: 02/08/2016 CLINICAL DATA:  Acute generalized abdominal pain, vomiting. EXAM: CT ABDOMEN AND PELVIS WITHOUT CONTRAST TECHNIQUE: Multidetector CT imaging of the abdomen and pelvis was performed following the standard protocol without IV contrast. COMPARISON:  CT scan of October 10, 2014. FINDINGS: Lower chest: Visualized lung bases are unremarkable. Hepatobiliary: No gallstones are noted. No focal abnormality is noted in the liver on these unenhanced images. Pancreas: Inflammatory changes are noted around the pancreatic head consistent with acute pancreatitis. Spleen: Normal. Adrenals/Urinary Tract: Adrenal glands and kidneys appear normal. No hydronephrosis or renal obstruction is noted. No renal or ureteral calculi are noted. Urinary bladder appears normal. Stomach/Bowel: The appendix appears normal. There is no evidence of bowel obstruction. Vascular/Lymphatic: Atherosclerosis of abdominal aorta is noted without aneurysm formation. No significant adenopathy is noted. Reproductive: Status post hysterectomy.  Ovaries are unremarkable. Other: No abnormal fluid collection is noted. No pseudocyst formation is noted. Musculoskeletal: Severe degenerative disc disease is noted at L5-S1. IMPRESSION: Findings consistent with acute pancreatitis. No definite pseudocyst formation seen at this  time. Aortic atherosclerosis. Electronically Signed   By: Lupita Raider, M.D.   On: 02/08/2016 19:28   Mr Brain Wo Contrast  Result Date: 02/08/2016 CLINICAL DATA:  60 year old female with altered mental status. Lethargy. Initial encounter. End-stage renal disease, dialysis. EXAM: MRI HEAD WITHOUT CONTRAST TECHNIQUE: Multiplanar, multiecho pulse sequences of the brain and surrounding structures were obtained without intravenous contrast. COMPARISON:  Brain MRI 10/23/2014, head CT 10/23/2014 and earlier. FINDINGS: Brain: No restricted diffusion to suggest acute infarction. No midline shift, mass effect, evidence of mass lesion, ventriculomegaly, extra-axial collection or acute intracranial hemorrhage. Cervicomedullary junction and pituitary are within normal limits. Stable mild to moderate for age nonspecific mostly periventricular cerebral white matter T2 and FLAIR hyperintensity since 2016. No cortical encephalomalacia or chronic cerebral blood products identified. Deep gray matter nuclei, brainstem, and cerebellum remain within normal limits. Vascular: Major intracranial vascular flow voids are stable. Skull and upper cervical spine: Detail degraded by motion, grossly stable and negative. Bone marrow signal appears within normal limits. Sinuses/Orbits: Stable and negative orbits soft tissues. Visualized paranasal sinuses and mastoids are stable and well pneumatized. Other: Visible internal auditory structures appear normal. Negative scalp soft tissues. IMPRESSION: No acute  intracranial abnormality. Intermittently motion degraded exam, but overall stable noncontrast MRI appearance of the brain since 2016. Electronically Signed   By: Odessa Fleming M.D.   On: 02/08/2016 14:25   Dg Chest Portable 1 View  Result Date: 02/07/2016 CLINICAL DATA:  CHEST PAIN,HX HTN,DM,COPD,CHF,RENAL DZ EXAM: PORTABLE CHEST - 1 VIEW COMPARISON:  10/23/2014 FINDINGS: Low lung volumes.  No focal infiltrate or overt edema. Heart size  upper limits normal for technique. Atheromatous aortic arch. No effusion. Visualized bones unremarkable. IMPRESSION: 1. Low volumes.  No acute disease. 2.  Aortic Atherosclerosis (ICD10-170.0) Electronically Signed   By: Corlis Leak M.D.   On: 02/07/2016 12:11     CBC  Recent Labs Lab 02/04/16 1340 02/07/16 1000 02/08/16 0625 02/09/16 0253  WBC 4.1 5.6 11.3* 10.7*  HGB 11.2* 12.2 12.7 12.7  HCT 36.3 38.5 38.0 39.9  PLT 129* 129* 171 155  MCV 94.5 93.7 91.1 93.2  MCH 29.2 29.7 30.5 29.7  MCHC 30.9* 31.7 33.4 31.8  RDW 14.4 14.4 15.2 15.4  LYMPHSABS 1,722 1.0  --   --   MONOABS 369 0.2  --   --   EOSABS 41 0.0  --   --   BASOSABS 0 0.0  --   --     Chemistries   Recent Labs Lab 02/04/16 1340 02/07/16 1000  02/08/16 0625 02/08/16 0856 02/08/16 1532 02/08/16 1625 02/09/16 0253  NA 133* 135  < > 135 134* 134* 134* 137  K 4.4 4.3  < > 5.7* 4.1 4.4 3.7 3.8  CL 96* 99*  < > 100* 96* 100* 99* 101  CO2 25 20*  < > 15* 23 20* 22 24  GLUCOSE 649* 484*  < > 466* 469* 131* 104* 220*  BUN 9 19  < > 27* 28* 32* 31* 39*  CREATININE 3.12* 4.24*  < > 3.81* 3.88* 4.12* 3.97* 5.10*  CALCIUM 9.2 9.5  < > 10.2 10.0 10.1 10.0 9.6  AST 28 35  --   --   --  33  --  33  ALT 17  18 22   --   --   --  20  --  20  ALKPHOS 219* 181*  --   --   --  169*  --  150*  BILITOT 0.4 1.1  --   --   --  0.5  --  0.6  < > = values in this interval not displayed. ------------------------------------------------------------------------------------------------------------------ estimated creatinine clearance is 10.7 mL/min (by C-G formula based on SCr of 5.1 mg/dL (H)). ------------------------------------------------------------------------------------------------------------------ No results for input(s): HGBA1C in the last 72 hours. ------------------------------------------------------------------------------------------------------------------  Recent Labs  02/08/16 1230  CHOL 173  HDL 71  LDLCALC  83  TRIG 93  CHOLHDL 2.4   ------------------------------------------------------------------------------------------------------------------  Recent Labs  02/08/16 1230  TSH 0.732   ------------------------------------------------------------------------------------------------------------------ No results for input(s): VITAMINB12, FOLATE, FERRITIN, TIBC, IRON, RETICCTPCT in the last 72 hours.  Coagulation profile  Recent Labs Lab 02/04/16 1340  INR 1.1    No results for input(s): DDIMER in the last 72 hours.  Cardiac Enzymes  Recent Labs Lab 02/08/16 0856 02/08/16 1532 02/08/16 2141  TROPONINI 0.03* 0.05* 0.03*   ------------------------------------------------------------------------------------------------------------------ Invalid input(s): POCBNP   CBG:  Recent Labs Lab 02/08/16 1906 02/08/16 1959 02/08/16 2348 02/09/16 0400 02/09/16 0754  GLUCAP 238* 247* 272* 209* 196*       Studies: Ct Abdomen Pelvis Wo Contrast  Result Date: 02/08/2016 CLINICAL DATA:  Acute generalized abdominal pain, vomiting. EXAM:  CT ABDOMEN AND PELVIS WITHOUT CONTRAST TECHNIQUE: Multidetector CT imaging of the abdomen and pelvis was performed following the standard protocol without IV contrast. COMPARISON:  CT scan of October 10, 2014. FINDINGS: Lower chest: Visualized lung bases are unremarkable. Hepatobiliary: No gallstones are noted. No focal abnormality is noted in the liver on these unenhanced images. Pancreas: Inflammatory changes are noted around the pancreatic head consistent with acute pancreatitis. Spleen: Normal. Adrenals/Urinary Tract: Adrenal glands and kidneys appear normal. No hydronephrosis or renal obstruction is noted. No renal or ureteral calculi are noted. Urinary bladder appears normal. Stomach/Bowel: The appendix appears normal. There is no evidence of bowel obstruction. Vascular/Lymphatic: Atherosclerosis of abdominal aorta is noted without aneurysm formation. No  significant adenopathy is noted. Reproductive: Status post hysterectomy.  Ovaries are unremarkable. Other: No abnormal fluid collection is noted. No pseudocyst formation is noted. Musculoskeletal: Severe degenerative disc disease is noted at L5-S1. IMPRESSION: Findings consistent with acute pancreatitis. No definite pseudocyst formation seen at this time. Aortic atherosclerosis. Electronically Signed   By: Lupita Raider, M.D.   On: 02/08/2016 19:28   Mr Brain Wo Contrast  Result Date: 02/08/2016 CLINICAL DATA:  60 year old female with altered mental status. Lethargy. Initial encounter. End-stage renal disease, dialysis. EXAM: MRI HEAD WITHOUT CONTRAST TECHNIQUE: Multiplanar, multiecho pulse sequences of the brain and surrounding structures were obtained without intravenous contrast. COMPARISON:  Brain MRI 10/23/2014, head CT 10/23/2014 and earlier. FINDINGS: Brain: No restricted diffusion to suggest acute infarction. No midline shift, mass effect, evidence of mass lesion, ventriculomegaly, extra-axial collection or acute intracranial hemorrhage. Cervicomedullary junction and pituitary are within normal limits. Stable mild to moderate for age nonspecific mostly periventricular cerebral white matter T2 and FLAIR hyperintensity since 2016. No cortical encephalomalacia or chronic cerebral blood products identified. Deep gray matter nuclei, brainstem, and cerebellum remain within normal limits. Vascular: Major intracranial vascular flow voids are stable. Skull and upper cervical spine: Detail degraded by motion, grossly stable and negative. Bone marrow signal appears within normal limits. Sinuses/Orbits: Stable and negative orbits soft tissues. Visualized paranasal sinuses and mastoids are stable and well pneumatized. Other: Visible internal auditory structures appear normal. Negative scalp soft tissues. IMPRESSION: No acute intracranial abnormality. Intermittently motion degraded exam, but overall stable  noncontrast MRI appearance of the brain since 2016. Electronically Signed   By: Odessa Fleming M.D.   On: 02/08/2016 14:25   Dg Chest Portable 1 View  Result Date: 02/07/2016 CLINICAL DATA:  CHEST PAIN,HX HTN,DM,COPD,CHF,RENAL DZ EXAM: PORTABLE CHEST - 1 VIEW COMPARISON:  10/23/2014 FINDINGS: Low lung volumes.  No focal infiltrate or overt edema. Heart size upper limits normal for technique. Atheromatous aortic arch. No effusion. Visualized bones unremarkable. IMPRESSION: 1. Low volumes.  No acute disease. 2.  Aortic Atherosclerosis (ICD10-170.0) Electronically Signed   By: Corlis Leak M.D.   On: 02/07/2016 12:11      Lab Results  Component Value Date   HGBA1C 15.5 (H) 10/13/2014   HGBA1C 12.3 (H) 01/07/2014   HGBA1C 7.7 (H) 02/14/2013   Lab Results  Component Value Date   LDLCALC 83 02/08/2016   CREATININE 5.10 (H) 02/09/2016       Scheduled Meds: . cefTRIAXone (ROCEPHIN)  IV  1 g Intravenous Q24H  . heparin  5,000 Units Subcutaneous Q8H  . insulin aspart  0-9 Units Subcutaneous Q4H  . insulin glargine  15 Units Subcutaneous q1800  . ondansetron (ZOFRAN) IV  4 mg Intravenous Q6H  . senna  1 tablet Oral BID  . sodium chloride flush  3 mL Intravenous Q12H   Continuous Infusions:    LOS: 2 days    Time spent: >30 MINS    [facility],Khayree Delellis  [facility] Pager [phone]. If 7PM-7AM, please contact night-coverage at [email or web address], password [email or web address] 02/09/2016, 8:27 AM  LOS: 2 days

## 2016-02-09 NOTE — Progress Notes (Signed)
Report called to rn on 6n. Patient with no complaints at the current time. Will transfer via wc.

## 2016-02-09 NOTE — Progress Notes (Signed)
Hospitalist Cross Coverage Rapid Response Note  Called to bedside re: patient somnolent, less responsive.  60 yo F with ESRD admitted with DKA and pancreatitis.  Was on insulin gtt initially, gap closed, transitioned to SubQ.  Has been getting scant morphine (two doses, last yesterday) for pancreatitis.  Dialyzed last yesterday.  At [employer] tonight, patient conversant and normal.  At 10P, RN came to administer meds and found patient poorly responsive, only groaned and made one work answers to sternal rub.  RRT called.  Stat CBG normal.  ABG showed no acidosis, hypercarbia, hypoxia. Narcan administered to no effect.  BP (!) 121/38 (BP Location: Right Arm)   Pulse 65   Temp 98.1 F (36.7 C) (Axillary)   Resp 12   Ht 4\' 9"  (1.448 m)   Wt 64.2 kg (141 lb 8.6 oz)   SpO2 100%   BMI 30.63 kg/m   Patient sleeping.  Rouses only to noxious stimuli, opens eyes briefly then falls back to sleep.  No lateral gaze.  Makes eye contact, pupils small but equal and reactive.  Not able to answer where she is, state symptoms.  Heart rate fast, no murmurs.  No crackles.  Restless, and apneic at times, then snoring.  Able to resist 4+/5 in both arms.  Symmetrically flaccid in both legs.     A&P: Acute somnolence.  Appears to be drug effect, but unclear what.  Morphine metabolites? Morphine and trazodone only offending agents.  No evidence of infection.  Neuro exam limited but nonfocal.    -D/c morphine -Stat CMP/CBC -Stat CT head -Transfer to SDU for BiPAP respiratory support overnight (prescribed CPAP at night at home, not clear that she uses it)

## 2016-02-09 NOTE — Progress Notes (Signed)
[facility] Progress Note   Assessment/ Plan:     Dialysis Orders: [facility] M[facility] 4 hours 400/Auto 1.5 63 kg  3.0K/2.5 Ca UF profile 4 Heparin 4000 units IV q treatment Aranesp 40 mcg IV q week (should have gotten dose 02/05/16-last actual dose documented 09/20/017 HGB 12.0 02/05/16) Venofer 50 mg IV (last dose 02/05/16 fe101 Tsat 39% 02/05/16  1. DKA: Per primary BS initially persistently elevated in the 400s-500s, now in the 100s, off insulin gtt, getting [place] insulin. 2. Acute Pancreatitis: lipase in the 1000s, CT abd/ pelvis showing acute pancreatitis, abd pain better. 3. Abdominal pain/N & V: Improved with antiemetics. Per primary Likely due to #2 as above 4.  ESRD -  MWF [facility]. HD today on schedule. Use 3.0 K bath. K+ 4.3 now usually 3.8, next HD 10/2.  5.  Hypertension/volume  -Extremely hypertensive in ED BP 199/72 presently. Takes coreg 12.5 mg PO BID per OP med list. Leaving slightly under EDW. No edema. Will challenge further 10/2.  Get standing wt, all others have been bed wts.  6.  Anemia  -HGB 12.2. No ESA needed. Follow HGB 7.  Metabolic bone disease -  Hold binders-last phos 2.9 02/05/16, Cont VDRA. Ca 9.5 C Ca 9.74 PTH 366 02/05/16 8.  Nutrition - NPO at present. Renal/Carb mod diet when able to eat. 9. AMS: MRI brain without acute abnormality.  Likely due to DKA/pancreatitis/meds; improving  Subjective:    Feeling better.  Blood sugars better, off insulin gtt.  CT with acute pancreatitis.   Objective:   BP (!) 164/87 (BP Location: Right Arm)   Pulse 72   Temp 99.3 F (37.4 C) (Oral)   Resp 12   Ht 4\' 9"  (1.448 m)   Wt 82.6 kg (182 lb 1.6 oz)   SpO2 99%   BMI 39.41 kg/m   Physical Exam: General: chronically ill appearing female, awake and alert Head: Normocephalic, atraumatic, sclera non-icteric, mucus membranes are moist Neck: Supple. JVD not elevated. Lungs: Clear bilaterally to auscultation without wheezes, rales, or rhonchi. Breathing is  unlabored. Heart: RRR with S1 S2. No murmurs, rubs, or gallops appreciated. SR on monitor. Abdomen: Soft, active bowel sounds, nontender.  Improved from yesterday. Lower extremities:without edema or ischemic changes, no open wounds  Neuro: alert, oriented, improved from yesterday Dialysis Access: LUA AVF +Bruit  Labs: BMET  Recent Labs Lab 02/07/16 1000 02/07/16 1714 02/08/16 0625 02/08/16 0856 02/08/16 1532 02/08/16 1625 02/09/16 0253  NA 135 136 135 134* 134* 134* 137  K 4.3 4.9 5.7* 4.1 4.4 3.7 3.8  CL 99* 100* 100* 96* 100* 99* 101  CO2 20* 19* 15* 23 20* 22 24  GLUCOSE 484* 566* 466* 469* 131* 104* 220*  BUN 19 24* 27* 28* 32* 31* 39*  CREATININE 4.24* 4.33* 3.81* 3.88* 4.12* 3.97* 5.10*  CALCIUM 9.5 9.3 10.2 10.0 10.1 10.0 9.6  PHOS  --  4.5  --   --   --   --   --    CBC  Recent Labs Lab 02/04/16 1340 02/07/16 1000 02/08/16 0625 02/09/16 0253  WBC 4.1 5.6 11.3* 10.7*  NEUTROABS 1,968 4.5  --   --   HGB 11.2* 12.2 12.7 12.7  HCT 36.3 38.5 38.0 39.9  MCV 94.5 93.7 91.1 93.2  PLT 129* 129* 171 155    @IMGRELPRIORS @ Medications:    . cefTRIAXone (ROCEPHIN)  IV  1 g Intravenous Q24H  . heparin  5,000 Units Subcutaneous Q8H  . insulin aspart  0-9 Units Subcutaneous Q4H  . insulin glargine  18 Units Subcutaneous q1800  . insulin glargine  3 Units Subcutaneous Once  . ondansetron (ZOFRAN) IV  4 mg Intravenous Q6H  . senna  1 tablet Oral BID  . sodium chloride flush  3 mL Intravenous Q12H     Bufford ButtnerElizabeth Pride Gonzales, MD [facility] Cell [phone] Pgr: [phone] 02/09/2016, 8:36 AM

## 2016-02-10 LAB — COMPREHENSIVE METABOLIC PANEL
ALBUMIN: 3 g/dL — AB (ref 3.5–5.0)
ALT: 22 U/L (ref 14–54)
AST: 47 U/L — AB (ref 15–41)
Alkaline Phosphatase: 120 U/L (ref 38–126)
Anion gap: 11 (ref 5–15)
BUN: 52 mg/dL — AB (ref 6–20)
CHLORIDE: 105 mmol/L (ref 101–111)
CO2: 23 mmol/L (ref 22–32)
CREATININE: 6.27 mg/dL — AB (ref 0.44–1.00)
Calcium: 9.1 mg/dL (ref 8.9–10.3)
GFR calc Af Amer: 8 mL/min — ABNORMAL LOW (ref >60)
GFR, EST NON AFRICAN AMERICAN: 7 mL/min — AB (ref >60)
GLUCOSE: 90 mg/dL (ref 65–99)
POTASSIUM: 3.5 mmol/L (ref 3.5–5.1)
Sodium: 139 mmol/L (ref 135–145)
Total Bilirubin: 0.7 mg/dL (ref 0.3–1.2)
Total Protein: 5.8 g/dL — ABNORMAL LOW (ref 6.5–8.1)

## 2016-02-10 LAB — GLUCOSE, CAPILLARY
GLUCOSE-CAPILLARY: 70 mg/dL (ref 65–99)
GLUCOSE-CAPILLARY: 272 mg/dL — AB (ref 65–99)
GLUCOSE-CAPILLARY: 111 mg/dL — AB (ref 65–99)
GLUCOSE-CAPILLARY: 140 mg/dL — AB (ref 65–99)
GLUCOSE-CAPILLARY: 164 mg/dL — AB (ref 65–99)
Glucose-Capillary: 169 mg/dL — ABNORMAL HIGH (ref 65–99)
Glucose-Capillary: 93 mg/dL (ref 65–99)

## 2016-02-10 LAB — BLOOD GAS, ARTERIAL
ACID-BASE DEFICIT: 0.6 mmol/L (ref 0.0–2.0)
Bicarbonate: 23.5 mmol/L (ref 20.0–28.0)
DRAWN BY: 236041
O2 Content: 2 L/min
O2 Saturation: 99.2 %
PCO2 ART: 38 mmHg (ref 32.0–48.0)
PH ART: 7.407 (ref 7.350–7.450)
Patient temperature: 98.6
pO2, Arterial: 150 mmHg — ABNORMAL HIGH (ref 83.0–108.0)

## 2016-02-10 LAB — CBC
HCT: 38 % (ref 36.0–46.0)
Hemoglobin: 12.2 g/dL (ref 12.0–15.0)
MCH: 29.8 pg (ref 26.0–34.0)
MCHC: 32.1 g/dL (ref 30.0–36.0)
MCV: 92.9 fL (ref 78.0–100.0)
PLATELETS: 135 10*3/uL — AB (ref 150–400)
RBC: 4.09 MIL/uL (ref 3.87–5.11)
RDW: 15.6 % — AB (ref 11.5–15.5)
WBC: 8.1 10*3/uL (ref 4.0–10.5)

## 2016-02-10 LAB — LIPASE, BLOOD: Lipase: 109 U/L — ABNORMAL HIGH (ref 11–51)

## 2016-02-10 LAB — AMMONIA: Ammonia: 24 umol/L (ref 9–35)

## 2016-02-10 MED ORDER — INSULIN GLARGINE 100 UNIT/ML ~~LOC~~ SOLN
15.0000 [IU] | Freq: Every day | SUBCUTANEOUS | Status: DC
  Administered 2016-02-10: 15 [IU] via SUBCUTANEOUS
  Filled 2016-02-10 (×2): qty 0.15

## 2016-02-10 NOTE — Progress Notes (Signed)
RN went into pt's room at 2145 to give Pt their 2200 meds. Upon trying to give the pts their meds the RN noticed that the pt was not responding to verbal commands. RN did a sternal rub and pt responded enough to open their eyes and drifted back off. RN got CBG=169, and vitals BP=121/38, 65, 100/ra, 98.6, and RR=10-12. Rapid Response was called to evaluate the pts condition and MD on call was also called. A order for 0.4mg  of narcan was given at the time and RN gave the narcan as MD had ordered. Pt didn't really respond to medication. MD sent another MD to evaluated pt's condition, and orders where giving to transfer pt off of the unit. Pt was transferred to 23m13 and report was giving to RN.

## 2016-02-10 NOTE — Progress Notes (Signed)
[facility] for [facility] group of problems.    Jennet Maduroenise Estridge, RN

## 2016-02-10 NOTE — Progress Notes (Signed)
Patient c/o being "hot" and sweating.  Upon entry into room, patient was awake, responsive, moist under her gown with "sweat".  VS obtained and stable.  CBG 111 mg/dl and patient had eaten some of her food tray.  After uncovering her she stated she felt "better".  Will continue to monitor.

## 2016-02-10 NOTE — Progress Notes (Signed)
[facility] Progress Note   Dialysis: [facility] MWF 4h  63kg   3K/ 2.5 Ca bath  Prof4   Hep 4000   AVF LUA Aranesp 40 mcg IV q week (should have gotten dose 02/05/16-last actual dose documented 09/20/017 HGB 12.0 02/05/16) Venofer 50 mg IV (last dose 02/05/16 fe101 Tsat 39% 02/05/16   Assessment/ Plan:     1. DKA: off insulin gtt, getting [place] insulin. 2. Acute Pancreatitis: lipase in the 1000s, CT abd/ pelvis showing acute pancreatitis, abd pain better. On liquid diet.  3.  ESRD -  MWF [facility]. HD today on schedule. Use 3.0 K bath. K+ 4.3 now usually 3.8, next HD 10/2.  4.  Hypertension/volume  -Extremely hypertensive in ED BP 199/72 presently. Takes coreg 12.5 mg PO BID per OP med list. Leaving slightly under EDW. No edema. Will challenge further 10/2.  Get standing wt, all others have been bed wts.  5.  Anemia  -HGB 12.2. No ESA needed. Follow HGB 6.  Metabolic bone disease -  Hold binders-last phos 2.9 02/05/16, Cont VDRA. Ca 9.5 C Ca 9.74 PTH 366 02/05/16 7.  Nutrition - NPO at present. Renal/Carb mod diet when able to eat. 8. AMS: MRI brain without acute abnormality.  Likely due to DKA/pancreatitis/meds; improving   Plan - HD today, min UF  Vinson Moselleob Birdia Jaycox MD [facility] pager [phone]   02/10/2016, 11:18 AM   Subjective:    Feeling better. Abd pain better. On HD, no c/o.    Objective:   BP 109/85   Pulse 77   Temp 98 F (36.7 C) (Oral)   Resp 11   Ht 4\' 9"  (1.448 m)   Wt 69.7 kg (153 lb 10.6 oz)   SpO2 100%   BMI 33.25 kg/m   Physical Exam: General: chronically ill appearing female, awake and alert Head: Normocephalic, atraumatic, sclera non-icteric, mucus membranes are moist Neck: Supple. JVD not elevated. Lungs: Clear bilaterally to auscultation without wheezes, rales, or rhonchi. Breathing is unlabored. Heart: RRR with S1 S2. No murmurs, rubs, or gallops appreciated. SR on monitor. Abdomen: Soft, active bowel sounds, nontender.   Improved from yesterday. Lower extremities:without edema or ischemic changes, no open wounds  Neuro: alert, oriented, improved from yesterday Dialysis Access: LUA AVF +Bruit  Labs: BMET  Recent Labs Lab 02/07/16 1714  02/08/16 0856 02/08/16 1532 02/08/16 1625 02/09/16 0253 02/09/16 1217 02/09/16 2230 02/10/16 0554  NA 136  < > 134* 134* 134* 137 137 138 139  K 4.9  < > 4.1 4.4 3.7 3.8 4.3 3.5 3.5  CL 100*  < > 96* 100* 99* 101 102 103 105  CO2 19*  < > 23 20* 22 24 22 22 23   GLUCOSE 566*  < > 469* 131* 104* 220* 308* 162* 90  BUN 24*  < > 28* 32* 31* 39* 45* 48* 52*  CREATININE 4.33*  < > 3.88* 4.12* 3.97* 5.10* 5.39* 6.01* 6.27*  CALCIUM 9.3  < > 10.0 10.1 10.0 9.6 9.5 9.3 9.1  PHOS 4.5  --   --   --   --   --  4.3  --   --   < > = values in this interval not displayed. CBC  Recent Labs Lab 02/04/16 1340 02/07/16 1000  02/09/16 0253 02/09/16 1217 02/09/16 2230 02/10/16 0554  WBC 4.1 5.6  < > 10.7* 9.5 10.1 8.1  NEUTROABS 1,968 4.5  --   --   --   --   --  HGB 11.2* 12.2  < > 12.7 13.9 12.4 12.2  HCT 36.3 38.5  < > 39.9 43.8 39.6 38.0  MCV 94.5 93.7  < > 93.2 93.4 94.1 92.9  PLT 129* 129*  < > 155 147* 155 135*  < > = values in this interval not displayed.  @IMGRELPRIORS @ Medications:    . [START ON 02/12/2016] ferric gluconate (FERRLECIT/NULECIT) IV  62.5 mg Intravenous Weekly  . heparin  5,000 Units Subcutaneous Q8H  . insulin aspart  0-9 Units Subcutaneous Q4H  . insulin glargine  15 Units Subcutaneous q1800  . ondansetron (ZOFRAN) IV  4 mg Intravenous Q6H  . pantoprazole  40 mg Oral Daily  . senna  1 tablet Oral BID  . sodium chloride flush  3 mL Intravenous Q12H  . sucralfate  1 g Oral TID WC & HS

## 2016-02-10 NOTE — Progress Notes (Signed)
[facility] PROGRESS NOTE  Norrine Lessley WUJ:[id number] DOB: 1955/08/31 DOA: 02/07/2016   PCP: Pamelia Hoit, MD     Assessment/Plan: Active Problems:   Diabetes mellitus type II, uncontrolled (HCC)   Anemia of chronic renal failure   Emesis, persistent   Uncontrollable vomiting   DKA (diabetic ketoacidosis) (HCC)   60 y.o. female, With past medical history relevant for end-stage renal disease with hemodialysis on Mondays Wednesdays and Fridays, hypertension diabetes and history of pulmonary hypertension who is usually noncompliant with her hemodialysis sessions admitted for intractable emesis of less than 24 hours duration. No fever no chills no diarrhea no sick contacts at home. Last dialysis session was 02/05/2016 patient apparently left her hemodialysis session less than 3 hours into a 4 session. No syncope. No chest pain she does have some dizziness but no palpitations  In ED- appears lethargic, slow to answer questions. Per report of brother, patient awakened with abdominal pain, nausea & vomiting. Per ED note, patient had 10/10 chest pain relieved .received IV Rocephin pending cultures  Assessment and plan 1)Intractable emesis- improving, CT scan did show acute pancreatitis, no definite pseudocyst, patient also developed DKA which has resolved,  Liver Function normal with the exception of alkaline phosphatase , lipase 1065.>109 Continue clear liquid diet for now, PPI for reflux   2) probable UTI-urine culture multiple morphologies, discontinue IV Rocephin   3)ESRD with hemodialysis on Mondays Wednesdays and Fridays- patient is notoriously noncompliant with her hemodialysis sessions, last hemodialysis session was 02/08/2016    4)HTN-  suspect that the patient does not do well at extremely low blood pressures  may use IV labetalol when necessary  Every 4 hours for systolic blood pressure over 160 mmhg  5)DM- patient was just placed on Novolog/Humalog Sliding  scale insulin with Accu-Cheks/Fingersticks as ordered,   VBG to rule out DKA was ordered but not done. Anion gap peaked at 20, now 11. Bicarbonate 23. Accu-Cheks improved,  patient is not eating a whole lot therefore reduce Lantus to 15 units twice a day  6) acute metabolic encephalopathy- patient had a similar presentation when her CBGs were in the 400-500 range. Developed somnolence and altered mental status last night. CT head did not show any acute abnormality. MRI previously negative during this admission. No improvement after receiving Narcan. Trazodone discontinued. Neuro exam nonfocal. TSH within normal limits. We'll check ammonia level. ABG within normal limits, probably related to hypoperfusion and improves with fluid bolus and trendelenburg position as well    DVT prophylaxsis heparin   Code Status:  Full code    Family Communication: Discussed in detail with the patient, all imaging results, lab results explained to the patient   Disposition Plan:  Continue stepdown      Consultants:  nephrology  Procedures:  None     Antibiotics: Anti-infectives    Start     Dose/Rate Route Frequency Ordered Stop   02/07/16 1315  cefTRIAXone (ROCEPHIN) 1 g in dextrose 5 % 50 mL IVPB  Status:  Discontinued     1 g 100 mL/hr over 30 Minutes Intravenous Every 24 hours 02/07/16 1314 02/10/16 0812         HPI/Subjective: Patient transferred to stepdown yesterday due to altered mental status, somnolent, less responsive, blood pressure  121/38 . Now better in HD   Objective: Vitals:   02/10/16 0300 02/10/16 0730 02/10/16 0734 02/10/16 0800  BP: 135/72 (!) 154/74 139/70 118/68  Pulse: 63 66 67 69  Resp:  10 11    Temp:  98 F (36.7 C)    TempSrc:  Oral    SpO2: 100% 100% 100%   Weight:  69.7 kg (153 lb 10.6 oz)    Height:        Intake/Output Summary (Last 24 hours) at 02/10/16 0818 Last data filed at 02/09/16 2033  Gross per 24 hour  Intake             1130 ml   Output                0 ml  Net             1130 ml    Exam:  Examination:  General exam: NAD Respiratory system: Clear to auscultation. Respiratory effort normal. Cardiovascular system: S1 & S2 heard, RRR. No JVD, murmurs, rubs, gallops or clicks. No pedal edema. Gastrointestinal system: Abdomen is nondistended, soft and nontender. No organomegaly or masses felt. Normal bowel sounds heard. Central nervous system: unable to assess  Extremities: Symmetric 5 x 5 power. Skin: No rashes, lesions or ulcers Psychiatry: Judgement and insight appear normal. Mood & affect appropriate.     Data Reviewed: I have personally reviewed following labs and imaging studies  Micro Results Recent Results (from the past 240 hour(s))  Urine culture     Status: Abnormal   Collection Time: 02/07/16 10:15 AM  Result Value Ref Range Status   Specimen Description URINE, CATHETERIZED  Final   Special Requests Normal  Final   Culture MULTIPLE SPECIES PRESENT, SUGGEST RECOLLECTION (A)  Final   Report Status 02/09/2016 FINAL  Final  MRSA PCR Screening     Status: None   Collection Time: 02/08/16  9:59 AM  Result Value Ref Range Status   MRSA by PCR NEGATIVE NEGATIVE Final    Comment:        The GeneXpert MRSA Assay (FDA approved for NASAL specimens only), is one component of a comprehensive MRSA colonization surveillance program. It is not intended to diagnose MRSA infection nor to guide or monitor treatment for MRSA infections.     Radiology Reports Ct Abdomen Pelvis Wo Contrast  Result Date: 02/08/2016 CLINICAL DATA:  Acute generalized abdominal pain, vomiting. EXAM: CT ABDOMEN AND PELVIS WITHOUT CONTRAST TECHNIQUE: Multidetector CT imaging of the abdomen and pelvis was performed following the standard protocol without IV contrast. COMPARISON:  CT scan of October 10, 2014. FINDINGS: Lower chest: Visualized lung bases are unremarkable. Hepatobiliary: No gallstones are noted. No focal abnormality is  noted in the liver on these unenhanced images. Pancreas: Inflammatory changes are noted around the pancreatic head consistent with acute pancreatitis. Spleen: Normal. Adrenals/Urinary Tract: Adrenal glands and kidneys appear normal. No hydronephrosis or renal obstruction is noted. No renal or ureteral calculi are noted. Urinary bladder appears normal. Stomach/Bowel: The appendix appears normal. There is no evidence of bowel obstruction. Vascular/Lymphatic: Atherosclerosis of abdominal aorta is noted without aneurysm formation. No significant adenopathy is noted. Reproductive: Status post hysterectomy.  Ovaries are unremarkable. Other: No abnormal fluid collection is noted. No pseudocyst formation is noted. Musculoskeletal: Severe degenerative disc disease is noted at L5-S1. IMPRESSION: Findings consistent with acute pancreatitis. No definite pseudocyst formation seen at this time. Aortic atherosclerosis. Electronically Signed   By: Lupita Raider, M.D.   On: 02/08/2016 19:28   Ct Head Wo Contrast  Result Date: 02/10/2016 CLINICAL DATA:  Acute onset altered mental status. Patient is poorly responsive. EXAM: CT HEAD WITHOUT CONTRAST TECHNIQUE: Contiguous axial images were  obtained from the base of the skull through the vertex without intravenous contrast. COMPARISON:  MRI brain 02/08/2016.  CT head 10/23/2014. FINDINGS: Brain: No evidence of acute infarction, hemorrhage, hydrocephalus, extra-axial collection or mass lesion/mass effect. Vascular: No hyperdense vessel or unexpected calcification. Skull: Normal. Negative for fracture or focal lesion. Sinuses/Orbits: No acute finding. Other: No significant changes since previous study. IMPRESSION: No acute intracranial abnormalities. Electronically Signed   By: Burman Nieves M.D.   On: 02/10/2016 00:09   Mr Brain Wo Contrast  Result Date: 02/08/2016 CLINICAL DATA:  60 year old female with altered mental status. Lethargy. Initial encounter. End-stage renal  disease, dialysis. EXAM: MRI HEAD WITHOUT CONTRAST TECHNIQUE: Multiplanar, multiecho pulse sequences of the brain and surrounding structures were obtained without intravenous contrast. COMPARISON:  Brain MRI 10/23/2014, head CT 10/23/2014 and earlier. FINDINGS: Brain: No restricted diffusion to suggest acute infarction. No midline shift, mass effect, evidence of mass lesion, ventriculomegaly, extra-axial collection or acute intracranial hemorrhage. Cervicomedullary junction and pituitary are within normal limits. Stable mild to moderate for age nonspecific mostly periventricular cerebral white matter T2 and FLAIR hyperintensity since 2016. No cortical encephalomalacia or chronic cerebral blood products identified. Deep gray matter nuclei, brainstem, and cerebellum remain within normal limits. Vascular: Major intracranial vascular flow voids are stable. Skull and upper cervical spine: Detail degraded by motion, grossly stable and negative. Bone marrow signal appears within normal limits. Sinuses/Orbits: Stable and negative orbits soft tissues. Visualized paranasal sinuses and mastoids are stable and well pneumatized. Other: Visible internal auditory structures appear normal. Negative scalp soft tissues. IMPRESSION: No acute intracranial abnormality. Intermittently motion degraded exam, but overall stable noncontrast MRI appearance of the brain since 2016. Electronically Signed   By: Odessa Fleming M.D.   On: 02/08/2016 14:25   Dg Chest Port 1 View  Result Date: 02/09/2016 CLINICAL DATA:  Shortness of breath. EXAM: PORTABLE CHEST 1 VIEW COMPARISON:  02/07/2016 FINDINGS: Normal heart size and pulmonary vascularity. No focal airspace disease or consolidation in the lungs. No blunting of costophrenic angles. No pneumothorax. Mediastinal contours appear intact. Calcification of the aorta. IMPRESSION: No active disease. Electronically Signed   By: Burman Nieves M.D.   On: 02/09/2016 22:58   Dg Chest Portable 1  View  Result Date: 02/07/2016 CLINICAL DATA:  CHEST PAIN,HX HTN,DM,COPD,CHF,RENAL DZ EXAM: PORTABLE CHEST - 1 VIEW COMPARISON:  10/23/2014 FINDINGS: Low lung volumes.  No focal infiltrate or overt edema. Heart size upper limits normal for technique. Atheromatous aortic arch. No effusion. Visualized bones unremarkable. IMPRESSION: 1. Low volumes.  No acute disease. 2.  Aortic Atherosclerosis (ICD10-170.0) Electronically Signed   By: Corlis Leak M.D.   On: 02/07/2016 12:11     CBC  Recent Labs Lab 02/04/16 1340 02/07/16 1000 02/08/16 0625 02/09/16 0253 02/09/16 1217 02/09/16 2230 02/10/16 0554  WBC 4.1 5.6 11.3* 10.7* 9.5 10.1 8.1  HGB 11.2* 12.2 12.7 12.7 13.9 12.4 12.2  HCT 36.3 38.5 38.0 39.9 43.8 39.6 38.0  PLT 129* 129* 171 155 147* 155 135*  MCV 94.5 93.7 91.1 93.2 93.4 94.1 92.9  MCH 29.2 29.7 30.5 29.7 29.6 29.5 29.8  MCHC 30.9* 31.7 33.4 31.8 31.7 31.3 32.1  RDW 14.4 14.4 15.2 15.4 15.4 15.4 15.6*  LYMPHSABS 1,722 1.0  --   --   --   --   --   MONOABS 369 0.2  --   --   --   --   --   EOSABS 41 0.0  --   --   --   --   --  BASOSABS 0 0.0  --   --   --   --   --     Chemistries   Recent Labs Lab 02/07/16 1000  02/08/16 1532 02/08/16 1625 02/09/16 0253 02/09/16 1217 02/09/16 2230 02/10/16 0554  NA 135  < > 134* 134* 137 137 138 139  K 4.3  < > 4.4 3.7 3.8 4.3 3.5 3.5  CL 99*  < > 100* 99* 101 102 103 105  CO2 20*  < > 20* 22 24 22 22 23   GLUCOSE 484*  < > 131* 104* 220* 308* 162* 90  BUN 19  < > 32* 31* 39* 45* 48* 52*  CREATININE 4.24*  < > 4.12* 3.97* 5.10* 5.39* 6.01* 6.27*  CALCIUM 9.5  < > 10.1 10.0 9.6 9.5 9.3 9.1  AST 35  --  33  --  33  --  45* 47*  ALT 22  --  20  --  20  --  18 22  ALKPHOS 181*  --  169*  --  150*  --  135* 120  BILITOT 1.1  --  0.5  --  0.6  --  0.4 0.7  < > = values in this interval not displayed. ------------------------------------------------------------------------------------------------------------------ estimated  creatinine clearance is 7.9 mL/min (by C-G formula based on SCr of 6.27 mg/dL (H)). ------------------------------------------------------------------------------------------------------------------  Recent Labs  02/08/16 0550  HGBA1C 11.7*   ------------------------------------------------------------------------------------------------------------------  Recent Labs  02/08/16 1230  CHOL 173  HDL 71  LDLCALC 83  TRIG 93  CHOLHDL 2.4   ------------------------------------------------------------------------------------------------------------------  Recent Labs  02/08/16 1230  TSH 0.732   ------------------------------------------------------------------------------------------------------------------ No results for input(s): VITAMINB12, FOLATE, FERRITIN, TIBC, IRON, RETICCTPCT in the last 72 hours.  Coagulation profile  Recent Labs Lab 02/04/16 1340  INR 1.1    No results for input(s): DDIMER in the last 72 hours.  Cardiac Enzymes  Recent Labs Lab 02/08/16 0856 02/08/16 1532 02/08/16 2141  TROPONINI 0.03* 0.05* 0.03*   ------------------------------------------------------------------------------------------------------------------ Invalid input(s): POCBNP   CBG:  Recent Labs Lab 02/09/16 1601 02/09/16 2005 02/09/16 2139 02/10/16 0000 02/10/16 0300  GLUCAP 277* 140* 169* 145* 70       Studies: Ct Abdomen Pelvis Wo Contrast  Result Date: 02/08/2016 CLINICAL DATA:  Acute generalized abdominal pain, vomiting. EXAM: CT ABDOMEN AND PELVIS WITHOUT CONTRAST TECHNIQUE: Multidetector CT imaging of the abdomen and pelvis was performed following the standard protocol without IV contrast. COMPARISON:  CT scan of October 10, 2014. FINDINGS: Lower chest: Visualized lung bases are unremarkable. Hepatobiliary: No gallstones are noted. No focal abnormality is noted in the liver on these unenhanced images. Pancreas: Inflammatory changes are noted around the pancreatic  head consistent with acute pancreatitis. Spleen: Normal. Adrenals/Urinary Tract: Adrenal glands and kidneys appear normal. No hydronephrosis or renal obstruction is noted. No renal or ureteral calculi are noted. Urinary bladder appears normal. Stomach/Bowel: The appendix appears normal. There is no evidence of bowel obstruction. Vascular/Lymphatic: Atherosclerosis of abdominal aorta is noted without aneurysm formation. No significant adenopathy is noted. Reproductive: Status post hysterectomy.  Ovaries are unremarkable. Other: No abnormal fluid collection is noted. No pseudocyst formation is noted. Musculoskeletal: Severe degenerative disc disease is noted at L5-S1. IMPRESSION: Findings consistent with acute pancreatitis. No definite pseudocyst formation seen at this time. Aortic atherosclerosis. Electronically Signed   By: Lupita Raider, M.D.   On: 02/08/2016 19:28   Ct Head Wo Contrast  Result Date: 02/10/2016 CLINICAL DATA:  Acute onset altered mental status. Patient is poorly responsive.  EXAM: CT HEAD WITHOUT CONTRAST TECHNIQUE: Contiguous axial images were obtained from the base of the skull through the vertex without intravenous contrast. COMPARISON:  MRI brain 02/08/2016.  CT head 10/23/2014. FINDINGS: Brain: No evidence of acute infarction, hemorrhage, hydrocephalus, extra-axial collection or mass lesion/mass effect. Vascular: No hyperdense vessel or unexpected calcification. Skull: Normal. Negative for fracture or focal lesion. Sinuses/Orbits: No acute finding. Other: No significant changes since previous study. IMPRESSION: No acute intracranial abnormalities. Electronically Signed   By: Burman NievesWilliam  Stevens M.D.   On: 02/10/2016 00:09   Mr Brain Wo Contrast  Result Date: 02/08/2016 CLINICAL DATA:  60 year old female with altered mental status. Lethargy. Initial encounter. End-stage renal disease, dialysis. EXAM: MRI HEAD WITHOUT CONTRAST TECHNIQUE: Multiplanar, multiecho pulse sequences of the brain  and surrounding structures were obtained without intravenous contrast. COMPARISON:  Brain MRI 10/23/2014, head CT 10/23/2014 and earlier. FINDINGS: Brain: No restricted diffusion to suggest acute infarction. No midline shift, mass effect, evidence of mass lesion, ventriculomegaly, extra-axial collection or acute intracranial hemorrhage. Cervicomedullary junction and pituitary are within normal limits. Stable mild to moderate for age nonspecific mostly periventricular cerebral white matter T2 and FLAIR hyperintensity since 2016. No cortical encephalomalacia or chronic cerebral blood products identified. Deep gray matter nuclei, brainstem, and cerebellum remain within normal limits. Vascular: Major intracranial vascular flow voids are stable. Skull and upper cervical spine: Detail degraded by motion, grossly stable and negative. Bone marrow signal appears within normal limits. Sinuses/Orbits: Stable and negative orbits soft tissues. Visualized paranasal sinuses and mastoids are stable and well pneumatized. Other: Visible internal auditory structures appear normal. Negative scalp soft tissues. IMPRESSION: No acute intracranial abnormality. Intermittently motion degraded exam, but overall stable noncontrast MRI appearance of the brain since 2016. Electronically Signed   By: Odessa FlemingH  Hall M.D.   On: 02/08/2016 14:25   Dg Chest Port 1 View  Result Date: 02/09/2016 CLINICAL DATA:  Shortness of breath. EXAM: PORTABLE CHEST 1 VIEW COMPARISON:  02/07/2016 FINDINGS: Normal heart size and pulmonary vascularity. No focal airspace disease or consolidation in the lungs. No blunting of costophrenic angles. No pneumothorax. Mediastinal contours appear intact. Calcification of the aorta. IMPRESSION: No active disease. Electronically Signed   By: Burman NievesWilliam  Stevens M.D.   On: 02/09/2016 22:58      Lab Results  Component Value Date   HGBA1C 11.7 (H) 02/08/2016   HGBA1C 15.5 (H) 10/13/2014   HGBA1C 12.3 (H) 01/07/2014   Lab  Results  Component Value Date   LDLCALC 83 02/08/2016   CREATININE 6.27 (H) 02/10/2016       Scheduled Meds: . [START ON 02/12/2016] ferric gluconate (FERRLECIT/NULECIT) IV  62.5 mg Intravenous Weekly  . heparin  5,000 Units Subcutaneous Q8H  . insulin aspart  0-9 Units Subcutaneous Q4H  . insulin glargine  15 Units Subcutaneous q1800  . ondansetron (ZOFRAN) IV  4 mg Intravenous Q6H  . pantoprazole  40 mg Oral Daily  . senna  1 tablet Oral BID  . sodium chloride flush  3 mL Intravenous Q12H  . sucralfate  1 g Oral TID WC & HS   Continuous Infusions:    LOS: 3 days    Time spent: >30 MINS    Csa Surgical Center LLCBROL,Mancil Pfenning  [facility] Pager [phone]. If 7PM-7AM, please contact night-coverage at [email or web address], password [email or web address] 02/10/2016, 8:18 AM  LOS: 3 days

## 2016-02-10 NOTE — Progress Notes (Signed)
Patient taken to HD via bed, family in attendance.  Unable to perform assessment prior to patient transfer to dialysis.

## 2016-02-11 LAB — GLUCOSE, CAPILLARY
GLUCOSE-CAPILLARY: 161 mg/dL — AB (ref 65–99)
GLUCOSE-CAPILLARY: 330 mg/dL — AB (ref 65–99)
GLUCOSE-CAPILLARY: 184 mg/dL — AB (ref 65–99)
GLUCOSE-CAPILLARY: 138 mg/dL — AB (ref 65–99)
Glucose-Capillary: 85 mg/dL (ref 65–99)
Glucose-Capillary: 125 mg/dL — ABNORMAL HIGH (ref 65–99)

## 2016-02-11 LAB — CBC
HEMATOCRIT: 46.2 % — AB (ref 36.0–46.0)
Hemoglobin: 14.7 g/dL (ref 12.0–15.0)
MCH: 30 pg (ref 26.0–34.0)
MCHC: 31.8 g/dL (ref 30.0–36.0)
MCV: 94.3 fL (ref 78.0–100.0)
PLATELETS: 144 10*3/uL — AB (ref 150–400)
RBC: 4.9 MIL/uL (ref 3.87–5.11)
RDW: 15.3 % (ref 11.5–15.5)
WBC: 8.3 10*3/uL (ref 4.0–10.5)

## 2016-02-11 LAB — COMPREHENSIVE METABOLIC PANEL
ALBUMIN: 3.2 g/dL — AB (ref 3.5–5.0)
ALT: 27 U/L (ref 14–54)
ANION GAP: 13 (ref 5–15)
AST: 59 U/L — ABNORMAL HIGH (ref 15–41)
Alkaline Phosphatase: 147 U/L — ABNORMAL HIGH (ref 38–126)
BILIRUBIN TOTAL: 0.7 mg/dL (ref 0.3–1.2)
BUN: 19 mg/dL (ref 6–20)
CHLORIDE: 100 mmol/L — AB (ref 101–111)
CO2: 25 mmol/L (ref 22–32)
Calcium: 9.9 mg/dL (ref 8.9–10.3)
Creatinine, Ser: 4.34 mg/dL — ABNORMAL HIGH (ref 0.44–1.00)
GFR calc Af Amer: 12 mL/min — ABNORMAL LOW (ref >60)
GFR calc non Af Amer: 10 mL/min — ABNORMAL LOW (ref >60)
GLUCOSE: 119 mg/dL — AB (ref 65–99)
POTASSIUM: 3.6 mmol/L (ref 3.5–5.1)
Sodium: 138 mmol/L (ref 135–145)
TOTAL PROTEIN: 6.6 g/dL (ref 6.5–8.1)

## 2016-02-11 MED ORDER — INSULIN GLARGINE 100 UNIT/ML ~~LOC~~ SOLN
18.0000 [IU] | Freq: Every day | SUBCUTANEOUS | Status: DC
  Administered 2016-02-11: 18 [IU] via SUBCUTANEOUS
  Filled 2016-02-11 (×2): qty 0.18

## 2016-02-11 MED ORDER — INSULIN ASPART 100 UNIT/ML ~~LOC~~ SOLN
3.0000 [IU] | Freq: Three times a day (TID) | SUBCUTANEOUS | Status: DC
  Administered 2016-02-11 – 2016-02-12 (×3): 3 [IU] via SUBCUTANEOUS

## 2016-02-11 NOTE — Progress Notes (Signed)
[facility] Progress Note   Dialysis: [facility] MWF 4h  63kg   3K/ 2.5 Ca bath  Prof4   Hep 4000   AVF LUA Aranesp 40 mcg IV q week (should have gotten dose 02/05/16-last actual dose documented 09/20/017 HGB 12.0 02/05/16) Venofer 50 mg IV (last dose 02/05/16 fe101 Tsat 39% 02/05/16   Assessment/ Plan:     1. DKA: off insulin gtt, getting [place] insulin. 2. Acute Pancreatitis: lipase 1000 > 100 , pain much better, taking solid foods. Not sure etiology, gallbladder on CT was normal.  Had "focal pancreatitis" last year as well, will d/w primary MD.  3.  ESRD -  MWF [facility] 4.  Hypertension - bp's soft, holding home meds (coreg/ norvasc) 5. Volume - need standing wt, euvolemic on exam. 6.  Anemia  -HGB 12.2. No ESA needed. Follow HGB 7.  Metabolic bone disease -  Hold binders-last phos 2.9 02/05/16, Cont VDRA. Ca 9.5 C Ca 9.74 PTH 366 02/05/16 8.  Nutrition - NPO at present. Renal/Carb mod diet when able to eat.  Plan - HD tomorrow, UF to dry wt.   Vinson Moselle MD [facility] pager [phone]   02/11/2016, 9:51 AM   Subjective:    Feeling better. Abd pain better. On HD, no c/o.    Objective:   BP (!) 110/59 (BP Location: Right Arm)   Pulse 71   Temp 97.4 F (36.3 C) (Oral)   Resp 14   Ht 4\' 9"  (1.448 m)   Wt 69.7 kg (153 lb 10.6 oz)   SpO2 100%   BMI 33.25 kg/m   Physical Exam: General: chronically ill appearing female, awake and alert Head: Normocephalic, atraumatic, sclera non-icteric, mucus membranes are moist Neck: Supple. JVD not elevated. Lungs: Clear bilaterally to auscultation without wheezes, rales, or rhonchi. Breathing is unlabored. Heart: RRR with S1 S2. No murmurs, rubs, or gallops appreciated. SR on monitor. Abdomen: Soft, active bowel sounds, nontender.  Improved from yesterday. Lower extremities:without edema or ischemic changes, no open wounds  Neuro: alert, oriented, improved from yesterday Dialysis Access: LUA AVF  +Bruit  Labs: BMET  Recent Labs Lab 02/07/16 1714  02/08/16 1532 02/08/16 1625 02/09/16 0253 02/09/16 1217 02/09/16 2230 02/10/16 0554 02/11/16 0423  NA 136  < > 134* 134* 137 137 138 139 138  K 4.9  < > 4.4 3.7 3.8 4.3 3.5 3.5 3.6  CL 100*  < > 100* 99* 101 102 103 105 100*  CO2 19*  < > 20* 22 24 22 22 23 25   GLUCOSE 566*  < > 131* 104* 220* 308* 162* 90 119*  BUN 24*  < > 32* 31* 39* 45* 48* 52* 19  CREATININE 4.33*  < > 4.12* 3.97* 5.10* 5.39* 6.01* 6.27* 4.34*  CALCIUM 9.3  < > 10.1 10.0 9.6 9.5 9.3 9.1 9.9  PHOS 4.5  --   --   --   --  4.3  --   --   --   < > = values in this interval not displayed. CBC  Recent Labs Lab 02/04/16 1340 02/07/16 1000  02/09/16 1217 02/09/16 2230 02/10/16 0554 02/11/16 0423  WBC 4.1 5.6  < > 9.5 10.1 8.1 8.3  NEUTROABS 1,968 4.5  --   --   --   --   --   HGB 11.2* 12.2  < > 13.9 12.4 12.2 14.7  HCT 36.3 38.5  < > 43.8 39.6 38.0 46.2*  MCV 94.5 93.7  < >  93.4 94.1 92.9 94.3  PLT 129* 129*  < > 147* 155 135* 144*  < > = values in this interval not displayed.  @IMGRELPRIORS @ Medications:    . [START ON 02/12/2016] ferric gluconate (FERRLECIT/NULECIT) IV  62.5 mg Intravenous Weekly  . heparin  5,000 Units Subcutaneous Q8H  . insulin aspart  0-9 Units Subcutaneous Q4H  . insulin glargine  15 Units Subcutaneous q1800  . ondansetron (ZOFRAN) IV  4 mg Intravenous Q6H  . pantoprazole  40 mg Oral Daily  . senna  1 tablet Oral BID  . sodium chloride flush  3 mL Intravenous Q12H  . sucralfate  1 g Oral TID WC & HS

## 2016-02-11 NOTE — Care Management Note (Signed)
Case Management Note  Patient Details  Name: Madeline [name] MRN: [id number] Date of Birth: 07/05/1955  Subjective/Objective:   Presents with n/v unresponsivness HD patient,  Per pt eval no pt f/u needed.  NCM will cont to follow for dc needs.                Action/Plan:   Expected Discharge Date:  02/09/16               Expected Discharge Plan:  Home/Self Care  In-House Referral:     Discharge planning Services  CM Consult  Post Acute Care Choice:    Choice offered to:     DME Arranged:    DME Agency:     HH Arranged:    HH Agency:     Status of Service:  In process, will continue to follow  If discussed at Long Length of Stay Meetings, dates discussed:    Additional Comments:  Leone Havenaylor, Shantavia Jha Clinton, RN 02/11/2016, 5:06 PM

## 2016-02-11 NOTE — Progress Notes (Signed)
OT Cancellation Note  Patient Details Name: Brock BadDorine Fitterer MRN: [id number] DOB: Sep 04, 1955   Cancelled Treatment:    Reason Eval/Treat Not Completed: OT screened, no needs identified, will sign off. Collen Hostler Smileyonarpe, OTR/L [phone]   Jeani HawkingConarpe, Bridgid Printz M 02/11/2016, 1:38 PM

## 2016-02-11 NOTE — Evaluation (Signed)
Physical Therapy Evaluation & Discharge Patient Details Name: Madeline BadDorine Depp MRN: [id number] DOB: 06/11/1955 Today's Date: 02/11/2016   History of Present Illness  60 y.o. female, With past medical history relevant for end-stage renal disease with hemodialysis on Mondays Wednesdays and Fridays, hypertension diabetes and history of pulmonary hypertension who is usually noncompliant with her hemodialysis sessions presents today with intractable emesis of less than 24 hours duration.   Clinical Impression  Patient presents with functional mobility close to her baseline.  Feel she is safe to walk with nursing or family assist to maintain mobility prior to d/c home.  No current follow up needs.  Will sign off.     Follow Up Recommendations No PT follow up    Equipment Recommendations  None recommended by PT    Recommendations for Other Services       Precautions / Restrictions Precautions Precautions: Fall Precaution Comments: uses walker appropriately to reduce fall risk      Mobility  Bed Mobility               General bed mobility comments: sitting up on EOB  Transfers Overall transfer level: Modified independent Equipment used: Rolling walker (2 wheeled)                Ambulation/Gait Ambulation/Gait assistance: Modified independent (Device/Increase time) Ambulation Distance (Feet): 400 Feet Assistive device: Rolling walker (2 wheeled) Gait Pattern/deviations: Step-through pattern;Decreased stride length;Shuffle     General Gait Details: able to maneuver walker without difficulty though she utilizes rollator at home  Stairs            Wheelchair Mobility    Modified Rankin (Stroke Patients Only)       Balance Overall balance assessment: Needs assistance   Sitting balance-Leahy Scale: Good     Standing balance support: No upper extremity supported Standing balance-Leahy Scale: Fair Standing balance comment: static balance without UE assist,  needs support for ambulation                             Pertinent Vitals/Pain Pain Assessment: No/denies pain    Home Living Family/patient expects to be discharged to:: Private residence Living Arrangements: Other relatives (brother) Available Help at Discharge: Family;Available 24 hours/day Type of Home: House Home Access: Stairs to enter Entrance Stairs-Rails: Right (one set of stairs with R rail, other set with L rail) Entrance Stairs-Number of Steps: 3 or 4 depending on side of entry Home Layout: One level Home Equipment: Walker - 4 wheels;Bedside commode;Cane - single point;Shower seat;Hand held shower head;Grab bars - tub/shower      Prior Function Level of Independence: Needs assistance   Gait / Transfers Assistance Needed: uses cane or walker at all times  ADL's / Homemaking Assistance Needed: takes transportation to dialysis        Hand Dominance   Dominant Hand: Right    Extremity/Trunk Assessment   Upper Extremity Assessment: Overall WFL for tasks assessed           Lower Extremity Assessment: Generalized weakness         Communication   Communication: No difficulties  Cognition Arousal/Alertness: Awake/alert Behavior During Therapy: WFL for tasks assessed/performed Overall Cognitive Status: Within Functional Limits for tasks assessed                      General Comments      Exercises     Assessment/Plan    PT  Assessment Patent does not need any further PT services  PT Problem List            PT Treatment Interventions      PT Goals (Current goals can be found in the Care Plan section)  Acute Rehab PT Goals PT Goal Formulation: All assessment and education complete, DC therapy    Frequency     Barriers to discharge        Co-evaluation               End of Session Equipment Utilized During Treatment: Gait belt Activity Tolerance: Patient tolerated treatment well Patient left: in bed;with call  bell/phone within reach;with family/visitor present           Time: 0981-1914 PT Time Calculation (min) (ACUTE ONLY): 17 min   Charges:   PT Evaluation $PT Eval Moderate Complexity: 1 Procedure     PT G CodesElray Mcgregor 03-11-2016, 11:35 AM Sheran Lawless, PT [phone] 2016-03-11

## 2016-02-11 NOTE — Progress Notes (Addendum)
[facility] PROGRESS NOTE  Madeline [name] ZOX:[id number] DOB: March 07, 1956 DOA: 02/07/2016   PCP: [name],[name], MD     Assessment/Plan: Active Problems:   Diabetes mellitus type II, uncontrolled (HCC)   Anemia of chronic renal failure   Emesis, persistent   Uncontrollable vomiting   DKA (diabetic ketoacidosis) (HCC)   60 y.o. female, With past medical history relevant for end-stage renal disease with hemodialysis on Mondays Wednesdays and Fridays, hypertension diabetes and history of pulmonary hypertension who is usually noncompliant with her hemodialysis sessions admitted for intractable emesis of less than 24 hours duration. No fever no chills no diarrhea no sick contacts at home. Last dialysis session was 02/05/2016 patient apparently left her hemodialysis session less than 3 hours into a 4 session.  Obtunded on 9/29 ,tx to step down, MRI brain showed no CVA, MS back to baseline after rx of DKA and pancreatitis ,tx back to floor 10/3.   Assessment and plan 1)Intractable emesis- improving, CT scan did show acute pancreatitis, no definite pseudocyst, patient also developed DKA which has resolved,  Liver Function normal with the exception of alkaline phosphatase , lipase 1065.>109, advanced to renal /carb diet , PPI for reflux , has some   2) probable UTI-urine culture multiple morphologies, discontinue IV Rocephin   3)ESRD with hemodialysis on Mondays Wednesdays and Fridays- patient is notoriously noncompliant with her hemodialysis sessions, last hemodialysis session was  10/3   4)HTN-  suspect that the patient does not do well at extremely low blood pressures  may use IV labetalol when necessary  Every 4 hours for systolic blood pressure over 160 mmhg  5)IDDM- patient was just placed on Novolog/Humalog Sliding scale insulin with Accu-Cheks/Fingersticks as ordered,   VBG to rule out DKA was ordered but not done. Anion gap peaked at 20, now 11. Bicarbonate 23.  Accu-Cheks worsened after patient started on a diet ,  Increase lantus to 18 units and start premeal novolog   6) acute metabolic encephalopathy- patient had a similar presentation when her CBGs were in the 400-500 range. Developed somnolence and altered mental status last night. CT head did not show any acute abnormality. MRI previously negative during this admission. No improvement after receiving Narcan. Trazodone discontinued. Neuro exam nonfocal. TSH within normal limits.   ammonia level 24 . ABG within normal limits, probably related to hypoperfusion and improves with fluid bolus and trendelenburg position as well. Mental status now  back to baseline     DVT prophylaxsis heparin   Code Status:  Full code    Family Communication: Discussed in detail with the patient, all imaging results, lab results explained to the patient   Disposition Plan:  tx to tele       Consultants:  nephrology  Procedures:  None     Antibiotics: Anti-infectives    Start     Dose/Rate Route Frequency Ordered Stop   02/07/16 1315  cefTRIAXone (ROCEPHIN) 1 g in dextrose 5 % 50 mL IVPB  Status:  Discontinued     1 g 100 mL/hr over 30 Minutes Intravenous Every 24 hours 02/07/16 1314 02/10/16 0812         HPI/Subjective:  MS improved, extended family in the room , had one soft stool today  Objective: Vitals:   02/11/16 0347 02/11/16 0645 02/11/16 1133 02/11/16 1502  BP: 101/72 (!) 110/59 138/90 (!) 158/86  Pulse: 63 71 78 86  Resp:  14 20 20   Temp: 98.7 F (37.1 C) 97.4  F (36.3 C) 98.9 F (37.2 C) 98.9 F (37.2 C)  TempSrc: Oral Oral Oral Oral  SpO2: 95% 100% 99% 99%  Weight:      Height:        Intake/Output Summary (Last 24 hours) at 02/11/16 1523 Last data filed at 02/11/16 1400  Gross per 24 hour  Intake              360 ml  Output                0 ml  Net              360 ml    Exam:  Examination:  General exam: NAD Respiratory system: Clear to auscultation.  Respiratory effort normal. Cardiovascular system: S1 & S2 heard, RRR. No JVD, murmurs, rubs, gallops or clicks. No pedal edema. Gastrointestinal system: Abdomen is nondistended, soft and nontender. No organomegaly or masses felt. Normal bowel sounds heard. Central nervous system: unable to assess  Extremities: Symmetric 5 x 5 power. Skin: No rashes, lesions or ulcers Psychiatry: Judgement and insight appear normal. Mood & affect appropriate.     Data Reviewed: I have personally reviewed following labs and imaging studies  Micro Results Recent Results (from the past 240 hour(s))  Urine culture     Status: Abnormal   Collection Time: 02/07/16 10:15 AM  Result Value Ref Range Status   Specimen Description URINE, CATHETERIZED  Final   Special Requests Normal  Final   Culture MULTIPLE SPECIES PRESENT, SUGGEST RECOLLECTION (A)  Final   Report Status 02/09/2016 FINAL  Final  MRSA PCR Screening     Status: None   Collection Time: 02/08/16  9:59 AM  Result Value Ref Range Status   MRSA by PCR NEGATIVE NEGATIVE Final    Comment:        The GeneXpert MRSA Assay (FDA approved for NASAL specimens only), is one component of a comprehensive MRSA colonization surveillance program. It is not intended to diagnose MRSA infection nor to guide or monitor treatment for MRSA infections.   Culture, blood (routine x 2)     Status: None (Preliminary result)   Collection Time: 02/09/16  9:27 AM  Result Value Ref Range Status   Specimen Description BLOOD RIGHT FOOT  Final   Special Requests IN PEDIATRIC BOTTLE 1CC  Final   Culture NO GROWTH 2 DAYS  Final   Report Status PENDING  Incomplete  Culture, blood (routine x 2)     Status: None (Preliminary result)   Collection Time: 02/09/16  9:37 AM  Result Value Ref Range Status   Specimen Description BLOOD RIGHT WRIST  Final   Special Requests IN PEDIATRIC BOTTLE .5CC  Final   Culture NO GROWTH 2 DAYS  Final   Report Status PENDING  Incomplete     Radiology Reports Ct Abdomen Pelvis Wo Contrast  Result Date: 02/08/2016 CLINICAL DATA:  Acute generalized abdominal pain, vomiting. EXAM: CT ABDOMEN AND PELVIS WITHOUT CONTRAST TECHNIQUE: Multidetector CT imaging of the abdomen and pelvis was performed following the standard protocol without IV contrast. COMPARISON:  CT scan of October 10, 2014. FINDINGS: Lower chest: Visualized lung bases are unremarkable. Hepatobiliary: No gallstones are noted. No focal abnormality is noted in the liver on these unenhanced images. Pancreas: Inflammatory changes are noted around the pancreatic head consistent with acute pancreatitis. Spleen: Normal. Adrenals/Urinary Tract: Adrenal glands and kidneys appear normal. No hydronephrosis or renal obstruction is noted. No renal or ureteral calculi are noted. Urinary bladder  appears normal. Stomach/Bowel: The appendix appears normal. There is no evidence of bowel obstruction. Vascular/Lymphatic: Atherosclerosis of abdominal aorta is noted without aneurysm formation. No significant adenopathy is noted. Reproductive: Status post hysterectomy.  Ovaries are unremarkable. Other: No abnormal fluid collection is noted. No pseudocyst formation is noted. Musculoskeletal: Severe degenerative disc disease is noted at L5-S1. IMPRESSION: Findings consistent with acute pancreatitis. No definite pseudocyst formation seen at this time. Aortic atherosclerosis. Electronically Signed   By: Lupita Raider, M.D.   On: 02/08/2016 19:28   Ct Head Wo Contrast  Result Date: 02/10/2016 CLINICAL DATA:  Acute onset altered mental status. Patient is poorly responsive. EXAM: CT HEAD WITHOUT CONTRAST TECHNIQUE: Contiguous axial images were obtained from the base of the skull through the vertex without intravenous contrast. COMPARISON:  MRI brain 02/08/2016.  CT head 10/23/2014. FINDINGS: Brain: No evidence of acute infarction, hemorrhage, hydrocephalus, extra-axial collection or mass lesion/mass effect.  Vascular: No hyperdense vessel or unexpected calcification. Skull: Normal. Negative for fracture or focal lesion. Sinuses/Orbits: No acute finding. Other: No significant changes since previous study. IMPRESSION: No acute intracranial abnormalities. Electronically Signed   By: Burman Nieves M.D.   On: 02/10/2016 00:09   Mr Brain Wo Contrast  Result Date: 02/08/2016 CLINICAL DATA:  60 year old female with altered mental status. Lethargy. Initial encounter. End-stage renal disease, dialysis. EXAM: MRI HEAD WITHOUT CONTRAST TECHNIQUE: Multiplanar, multiecho pulse sequences of the brain and surrounding structures were obtained without intravenous contrast. COMPARISON:  Brain MRI 10/23/2014, head CT 10/23/2014 and earlier. FINDINGS: Brain: No restricted diffusion to suggest acute infarction. No midline shift, mass effect, evidence of mass lesion, ventriculomegaly, extra-axial collection or acute intracranial hemorrhage. Cervicomedullary junction and pituitary are within normal limits. Stable mild to moderate for age nonspecific mostly periventricular cerebral white matter T2 and FLAIR hyperintensity since 2016. No cortical encephalomalacia or chronic cerebral blood products identified. Deep gray matter nuclei, brainstem, and cerebellum remain within normal limits. Vascular: Major intracranial vascular flow voids are stable. Skull and upper cervical spine: Detail degraded by motion, grossly stable and negative. Bone marrow signal appears within normal limits. Sinuses/Orbits: Stable and negative orbits soft tissues. Visualized paranasal sinuses and mastoids are stable and well pneumatized. Other: Visible internal auditory structures appear normal. Negative scalp soft tissues. IMPRESSION: No acute intracranial abnormality. Intermittently motion degraded exam, but overall stable noncontrast MRI appearance of the brain since 2016. Electronically Signed   By: Odessa Fleming M.D.   On: 02/08/2016 14:25   Dg Chest Port 1  View  Result Date: 02/09/2016 CLINICAL DATA:  Shortness of breath. EXAM: PORTABLE CHEST 1 VIEW COMPARISON:  02/07/2016 FINDINGS: Normal heart size and pulmonary vascularity. No focal airspace disease or consolidation in the lungs. No blunting of costophrenic angles. No pneumothorax. Mediastinal contours appear intact. Calcification of the aorta. IMPRESSION: No active disease. Electronically Signed   By: Burman Nieves M.D.   On: 02/09/2016 22:58   Dg Chest Portable 1 View  Result Date: 02/07/2016 CLINICAL DATA:  CHEST PAIN,HX HTN,DM,COPD,CHF,RENAL DZ EXAM: PORTABLE CHEST - 1 VIEW COMPARISON:  10/23/2014 FINDINGS: Low lung volumes.  No focal infiltrate or overt edema. Heart size upper limits normal for technique. Atheromatous aortic arch. No effusion. Visualized bones unremarkable. IMPRESSION: 1. Low volumes.  No acute disease. 2.  Aortic Atherosclerosis (ICD10-170.0) Electronically Signed   By: Corlis Leak M.D.   On: 02/07/2016 12:11     CBC  Recent Labs Lab 02/07/16 1000  02/09/16 0253 02/09/16 1217 02/09/16 2230 02/10/16 0554 02/11/16 0423  WBC 5.6  < > 10.7* 9.5 10.1 8.1 8.3  HGB 12.2  < > 12.7 13.9 12.4 12.2 14.7  HCT 38.5  < > 39.9 43.8 39.6 38.0 46.2*  PLT 129*  < > 155 147* 155 135* 144*  MCV 93.7  < > 93.2 93.4 94.1 92.9 94.3  MCH 29.7  < > 29.7 29.6 29.5 29.8 30.0  MCHC 31.7  < > 31.8 31.7 31.3 32.1 31.8  RDW 14.4  < > 15.4 15.4 15.4 15.6* 15.3  LYMPHSABS 1.0  --   --   --   --   --   --   MONOABS 0.2  --   --   --   --   --   --   EOSABS 0.0  --   --   --   --   --   --   BASOSABS 0.0  --   --   --   --   --   --   < > = values in this interval not displayed.  Chemistries   Recent Labs Lab 02/08/16 1532  02/09/16 0253 02/09/16 1217 02/09/16 2230 02/10/16 0554 02/11/16 0423  NA 134*  < > 137 137 138 139 138  K 4.4  < > 3.8 4.3 3.5 3.5 3.6  CL 100*  < > 101 102 103 105 100*  CO2 20*  < > 24 22 22 23 25   GLUCOSE 131*  < > 220* 308* 162* 90 119*  BUN 32*  < >  39* 45* 48* 52* 19  CREATININE 4.12*  < > 5.10* 5.39* 6.01* 6.27* 4.34*  CALCIUM 10.1  < > 9.6 9.5 9.3 9.1 9.9  AST 33  --  33  --  45* 47* 59*  ALT 20  --  20  --  18 22 27   ALKPHOS 169*  --  150*  --  135* 120 147*  BILITOT 0.5  --  0.6  --  0.4 0.7 0.7  < > = values in this interval not displayed. ------------------------------------------------------------------------------------------------------------------ estimated creatinine clearance is 11.4 mL/min (by C-G formula based on SCr of 4.34 mg/dL (H)). ------------------------------------------------------------------------------------------------------------------ No results for input(s): HGBA1C in the last 72 hours. ------------------------------------------------------------------------------------------------------------------ No results for input(s): CHOL, HDL, LDLCALC, TRIG, CHOLHDL, LDLDIRECT in the last 72 hours. ------------------------------------------------------------------------------------------------------------------ No results for input(s): TSH, T4TOTAL, T3FREE, THYROIDAB in the last 72 hours.  Invalid input(s): FREET3 ------------------------------------------------------------------------------------------------------------------ No results for input(s): VITAMINB12, FOLATE, FERRITIN, TIBC, IRON, RETICCTPCT in the last 72 hours.  Coagulation profile No results for input(s): INR, PROTIME in the last 168 hours.  No results for input(s): DDIMER in the last 72 hours.  Cardiac Enzymes  Recent Labs Lab 02/08/16 0856 02/08/16 1532 02/08/16 2141  TROPONINI 0.03* 0.05* 0.03*   ------------------------------------------------------------------------------------------------------------------ Invalid input(s): POCBNP   CBG:  Recent Labs Lab 02/10/16 1945 02/10/16 2246 02/11/16 0349 02/11/16 0729 02/11/16 1131  GLUCAP 140* 164* 161* 85 330*       Studies: Ct Head Wo Contrast  Result Date:  02/10/2016 CLINICAL DATA:  Acute onset altered mental status. Patient is poorly responsive. EXAM: CT HEAD WITHOUT CONTRAST TECHNIQUE: Contiguous axial images were obtained from the base of the skull through the vertex without intravenous contrast. COMPARISON:  MRI brain 02/08/2016.  CT head 10/23/2014. FINDINGS: Brain: No evidence of acute infarction, hemorrhage, hydrocephalus, extra-axial collection or mass lesion/mass effect. Vascular: No hyperdense vessel or unexpected calcification. Skull: Normal. Negative for fracture or focal lesion. Sinuses/Orbits: No acute finding. Other: No significant  changes since previous study. IMPRESSION: No acute intracranial abnormalities. Electronically Signed   By: Burman Nieves M.D.   On: 02/10/2016 00:09   Dg Chest Port 1 View  Result Date: 02/09/2016 CLINICAL DATA:  Shortness of breath. EXAM: PORTABLE CHEST 1 VIEW COMPARISON:  02/07/2016 FINDINGS: Normal heart size and pulmonary vascularity. No focal airspace disease or consolidation in the lungs. No blunting of costophrenic angles. No pneumothorax. Mediastinal contours appear intact. Calcification of the aorta. IMPRESSION: No active disease. Electronically Signed   By: Burman Nieves M.D.   On: 02/09/2016 22:58      Lab Results  Component Value Date   HGBA1C 11.7 (H) 02/08/2016   HGBA1C 15.5 (H) 10/13/2014   HGBA1C 12.3 (H) 01/07/2014   Lab Results  Component Value Date   LDLCALC 83 02/08/2016   CREATININE 4.34 (H) 02/11/2016       Scheduled Meds: . [START ON 02/12/2016] ferric gluconate (FERRLECIT/NULECIT) IV  62.5 mg Intravenous Weekly  . heparin  5,000 Units Subcutaneous Q8H  . insulin aspart  0-9 Units Subcutaneous Q4H  . insulin aspart  3 Units Subcutaneous TID WC  . insulin glargine  15 Units Subcutaneous q1800  . ondansetron (ZOFRAN) IV  4 mg Intravenous Q6H  . pantoprazole  40 mg Oral Daily  . senna  1 tablet Oral BID  . sodium chloride flush  3 mL Intravenous Q12H  . sucralfate  1 g  Oral TID WC & HS   Continuous Infusions:    LOS: 4 days    Time spent: >30 MINS    [facility]  [facility] Pager [phone]. If 7PM-7AM, please contact night-coverage at [email or web address], password [facility] 02/11/2016, 3:23 PM  LOS: 4 days

## 2016-02-11 NOTE — Progress Notes (Signed)
Inpatient Diabetes Program Recommendations  AACE/ADA: New Consensus Statement on Inpatient Glycemic Control (2015)  Target Ranges:  Prepandial:   less than 140 mg/dL      Peak postprandial:   less than 180 mg/dL (1-2 hours)      Critically ill patients:  140 - 180 mg/dL   Results for Rydberg, Lailany (MRN [id number]) as of 02/11/2016 11:35  Ref. Range 02/10/2016 00:00 02/10/2016 03:00 02/10/2016 12:48 02/10/2016 15:47 02/10/2016 19:02 02/10/2016 19:45 02/10/2016 22:46  Glucose-Capillary Latest Ref Range: 65 - 99 mg/dL 782145 (H) 70 93 956272 (H) 213111 (H) 140 (H) 164 (H)   Results for Governale, Robynne (MRN [id number]) as of 02/11/2016 11:35  Ref. Range 02/11/2016 03:49 02/11/2016 07:29 02/11/2016 11:31  Glucose-Capillary Latest Ref Range: 65 - 99 mg/dL 629161 (H) 85 528330 (H)    Home DM Meds: Lantus 10 units QHS       Humalog 3-10 units tid per SSI  Current Insulin Orders: Lantus 15 units QPM      Novolog Sensitive Correction Scale/ SSI (0-9 units) Q4 hours    MD- Please consider the following in-hospital insulin adjustments:  Please start Novolog Meal Coverage for this patient: Novolog 3 units tid with meals (hold if pt eats <50% of meal)     Spoke with patient about her current A1c of 11.7%.  Explained what an A1c is and what it measures.  Reminded patient that her goal A1c is 7% or less per ADA standards to prevent both acute and long-term complications.  Explained to patient the extreme importance of good glucose control at home.  Encouraged patient to check her CBGs at least tidac at home and to record all CBGs in a logbook for her PCP to review.    Patient stated to me that she sees Dr. Benedetto GoadFred Wilson for her DM management.  Has been having some low blood sugar issues at home and has been cutting back on her insulins.  I encouraged patient to make sure she is checking her CBGs tidac.  Explained to patient that if her fasting glucose is within range (80-130 mg/dl) that her Lantus dose is appropriate.  Also  explained to pt that if she notices her CBG checks before lunch, before dinner, or before bedtime are elevated >180 mg/dl that she may need more Humalog at home with meals.  I encouraged patient to call Dr. Andrey CampanileWilson if she sees that her CBGs are continuously high or low at home to get advice on what to do with her insulins.     --Will follow patient during hospitalization--  Ambrose FinlandJeannine Johnston Anitha Kreiser RN, MSN, CDE Diabetes Coordinator Inpatient Glycemic Control Team Team Pager: [phone] (8a-5p)

## 2016-02-12 DIAGNOSIS — E0810 Diabetes mellitus due to underlying condition with ketoacidosis without coma: Secondary | ICD-10-CM

## 2016-02-12 LAB — CBC
HCT: 38.9 % (ref 36.0–46.0)
Hemoglobin: 12.4 g/dL (ref 12.0–15.0)
MCH: 29.7 pg (ref 26.0–34.0)
MCHC: 31.9 g/dL (ref 30.0–36.0)
MCV: 93.1 fL (ref 78.0–100.0)
PLATELETS: 142 10*3/uL — AB (ref 150–400)
RBC: 4.18 MIL/uL (ref 3.87–5.11)
RDW: 15.4 % (ref 11.5–15.5)
WBC: 6.6 10*3/uL (ref 4.0–10.5)

## 2016-02-12 LAB — GLUCOSE, CAPILLARY
GLUCOSE-CAPILLARY: 205 mg/dL — AB (ref 65–99)
GLUCOSE-CAPILLARY: 154 mg/dL — AB (ref 65–99)
Glucose-Capillary: 163 mg/dL — ABNORMAL HIGH (ref 65–99)

## 2016-02-12 LAB — RENAL FUNCTION PANEL
Albumin: 3.1 g/dL — ABNORMAL LOW (ref 3.5–5.0)
Anion gap: 14 (ref 5–15)
BUN: 41 mg/dL — AB (ref 6–20)
CHLORIDE: 98 mmol/L — AB (ref 101–111)
CO2: 22 mmol/L (ref 22–32)
CREATININE: 6.75 mg/dL — AB (ref 0.44–1.00)
Calcium: 9.6 mg/dL (ref 8.9–10.3)
GFR calc Af Amer: 7 mL/min — ABNORMAL LOW (ref >60)
GFR calc non Af Amer: 6 mL/min — ABNORMAL LOW (ref >60)
Glucose, Bld: 291 mg/dL — ABNORMAL HIGH (ref 65–99)
Phosphorus: 4 mg/dL (ref 2.5–4.6)
Potassium: 3.9 mmol/L (ref 3.5–5.1)
Sodium: 134 mmol/L — ABNORMAL LOW (ref 135–145)

## 2016-02-12 MED ORDER — INSULIN GLARGINE 100 UNIT/ML ~~LOC~~ SOLN: 17.0000 [IU] | Freq: Every day | SUBCUTANEOUS | 11 refills | Status: DC

## 2016-02-12 NOTE — Progress Notes (Signed)
Pt refusing cardiac monitoring.  Will continue to provide education.

## 2016-02-12 NOTE — Discharge Summary (Signed)
Physician Discharge Summary  Madeline [name]:[id number] DOB: October 22, 1955 DOA: 02/07/2016  PCP: Pamelia Hoit, MD  Admit date: 02/07/2016 Discharge date: 02/12/2016  Admitted From: home Disposition:  home  Recommendations for Outpatient Follow-up:  1. Follow up with PCP in 1-2 weeks 2. HD as scheduled  Home Health: none Equipment/Devices: none  Discharge Condition: stable CODE STATUS: Full Diet recommendation: renal, diabetic  HPI: Madeline [name]  is a 60 y.o. female, With past medical history relevant for end-stage renal disease with hemodialysis on Mondays Wednesdays and Fridays, hypertension diabetes and history of pulmonary hypertension who is usually noncompliant with her hemodialysis sessions presents today with intractable emesis of less than 24 hours duration. No fever no chills no diarrhea no sick contacts at home. Last dialysis session was 02/05/2016 patient apparently left her hemodialysis session less than 3 hours into a 4 session. No syncope. No chest pain she does have some dizziness but no palpitations  Hospital Course: Discharge Diagnoses:  Active Problems:   Diabetes mellitus type II, uncontrolled (HCC)   Anemia of chronic renal failure   Emesis, persistent   Uncontrollable vomiting   DKA (diabetic ketoacidosis) (HCC)   Abdominal pain, nausea/vomiting due to acute pancreatitis - improving, CT scan did show acute pancreatitis, no definite pseudocyst. Cause for her pancreatitis is not clear at this point, she has fairly unremarkable LFTs, normal bilirubin and normal CT imaging. Of note patient had pancreatitis last year as well, underwent an MRCP without clear cause. Triglycerides are normal. Lipase improved and patient able to tolerate a regular diet. UTI - initially treated as UTI, cultures remained negative and her antibiotics were discontinued  ESRDwith hemodialysis on Mondays Wednesdays and Fridays - patient is notoriously noncompliant with her hemodialysis  sessions, last hemodialysis session was 10/4 prior to d/c HTN -  resume home medications IDDM with hyperglycemia and increased anion gap without definite evidence of DKA - ?noncompliance at home, patient per her report decreased her Lantus from 18U to 10U prior to admission. She was placed on insulin, CBGs improved. Will increase back up her Lantus to 17 U on discharge. Acute metabolic encephalopathy - patient had a similar presentation when her CBGs were in the 400-500 range. Developed somnolence and altered mental status, CT head did not show any acute abnormality. MRI previously negative during this admission. Likely due to hyperglycemia, resolved with improvement in her CBGs  Discharge Instructions    Medication List    TAKE these medications   acetaminophen 500 MG tablet Commonly known as:  TYLENOL Take 500-1,000 mg by mouth every 6 (six) hours as needed for moderate pain.   albuterol 108 (90 Base) MCG/ACT inhaler Commonly known as:  PROVENTIL HFA;VENTOLIN HFA Inhale 2 puffs into the lungs every 6 (six) hours as needed for wheezing or shortness of breath.   amLODipine 10 MG tablet Commonly known as:  NORVASC Take 10 mg by mouth daily.   aspirin EC 81 MG tablet Take 81 mg by mouth daily.   atorvastatin 40 MG tablet Commonly known as:  LIPITOR Take 40 mg by mouth daily.   calcitRIOL 0.25 MCG capsule Commonly known as:  ROCALTROL Take 0.25 mcg by mouth daily.   calcium acetate 667 MG capsule Commonly known as:  PHOSLO Take 667 mg by mouth 3 (three) times daily with meals.   carvedilol 25 MG tablet Commonly known as:  COREG Take 25 mg by mouth 2 (two) times daily with a meal.   insulin glargine 100 UNIT/ML injection Commonly known as:  LANTUS Inject 0.17 mLs (17 Units total) into the skin at bedtime. What changed:  how much to take   insulin lispro 100 UNIT/ML injection Commonly known as:  HUMALOG Inject 3-10 Units into the skin 3 (three) times daily before meals.  Sliding scale.  CBG 200: 3 UNITS, 250 5 UNITS, 300 7 UNITS, 350 9-10 UNITS   metoCLOPramide 5 MG tablet Commonly known as:  REGLAN Take 1 tablet (5 mg total) by mouth 4 (four) times daily -  before meals and at bedtime.   nitroGLYCERIN 0.4 MG SL tablet Commonly known as:  NITROSTAT PLACE ONE TABLET UNDER THE TONGUE EVERY FIVE MINUTES AS NEEDED FOR CHEST PAIN   pantoprazole 40 MG tablet Commonly known as:  PROTONIX Take 1 tablet (40 mg total) by mouth 2 (two) times daily.   polyethylene glycol packet Commonly known as:  MIRALAX / GLYCOLAX Take 17 g by mouth daily.   promethazine 25 MG tablet Commonly known as:  PHENERGAN Take 25 mg by mouth 2 (two) times daily as needed for nausea or vomiting.   sevelamer carbonate 800 MG tablet Commonly known as:  RENVELA Take 800 mg by mouth 3 (three) times daily with meals.   traMADol 50 MG tablet Commonly known as:  ULTRAM Take by mouth 2 (two) times daily. prn       No Known Allergies  Consultations:  Nephrology   Procedures/Studies:  None   Ct Abdomen Pelvis Wo Contrast  Result Date: 02/08/2016 CLINICAL DATA:  Acute generalized abdominal pain, vomiting. EXAM: CT ABDOMEN AND PELVIS WITHOUT CONTRAST TECHNIQUE: Multidetector CT imaging of the abdomen and pelvis was performed following the standard protocol without IV contrast. COMPARISON:  CT scan of October 10, 2014. FINDINGS: Lower chest: Visualized lung bases are unremarkable. Hepatobiliary: No gallstones are noted. No focal abnormality is noted in the liver on these unenhanced images. Pancreas: Inflammatory changes are noted around the pancreatic head consistent with acute pancreatitis. Spleen: Normal. Adrenals/Urinary Tract: Adrenal glands and kidneys appear normal. No hydronephrosis or renal obstruction is noted. No renal or ureteral calculi are noted. Urinary bladder appears normal. Stomach/Bowel: The appendix appears normal. There is no evidence of bowel obstruction.  Vascular/Lymphatic: Atherosclerosis of abdominal aorta is noted without aneurysm formation. No significant adenopathy is noted. Reproductive: Status post hysterectomy.  Ovaries are unremarkable. Other: No abnormal fluid collection is noted. No pseudocyst formation is noted. Musculoskeletal: Severe degenerative disc disease is noted at L5-S1. IMPRESSION: Findings consistent with acute pancreatitis. No definite pseudocyst formation seen at this time. Aortic atherosclerosis. Electronically Signed   By: Lupita Raider, M.D.   On: 02/08/2016 19:28   Ct Head Wo Contrast  Result Date: 02/10/2016 CLINICAL DATA:  Acute onset altered mental status. Patient is poorly responsive. EXAM: CT HEAD WITHOUT CONTRAST TECHNIQUE: Contiguous axial images were obtained from the base of the skull through the vertex without intravenous contrast. COMPARISON:  MRI brain 02/08/2016.  CT head 10/23/2014. FINDINGS: Brain: No evidence of acute infarction, hemorrhage, hydrocephalus, extra-axial collection or mass lesion/mass effect. Vascular: No hyperdense vessel or unexpected calcification. Skull: Normal. Negative for fracture or focal lesion. Sinuses/Orbits: No acute finding. Other: No significant changes since previous study. IMPRESSION: No acute intracranial abnormalities. Electronically Signed   By: Burman Nieves M.D.   On: 02/10/2016 00:09   Mr Brain Wo Contrast  Result Date: 02/08/2016 CLINICAL DATA:  60 year old female with altered mental status. Lethargy. Initial encounter. End-stage renal disease, dialysis. EXAM: MRI HEAD WITHOUT CONTRAST TECHNIQUE: Multiplanar, multiecho pulse sequences of  the brain and surrounding structures were obtained without intravenous contrast. COMPARISON:  Brain MRI 10/23/2014, head CT 10/23/2014 and earlier. FINDINGS: Brain: No restricted diffusion to suggest acute infarction. No midline shift, mass effect, evidence of mass lesion, ventriculomegaly, extra-axial collection or acute intracranial  hemorrhage. Cervicomedullary junction and pituitary are within normal limits. Stable mild to moderate for age nonspecific mostly periventricular cerebral white matter T2 and FLAIR hyperintensity since 2016. No cortical encephalomalacia or chronic cerebral blood products identified. Deep gray matter nuclei, brainstem, and cerebellum remain within normal limits. Vascular: Major intracranial vascular flow voids are stable. Skull and upper cervical spine: Detail degraded by motion, grossly stable and negative. Bone marrow signal appears within normal limits. Sinuses/Orbits: Stable and negative orbits soft tissues. Visualized paranasal sinuses and mastoids are stable and well pneumatized. Other: Visible internal auditory structures appear normal. Negative scalp soft tissues. IMPRESSION: No acute intracranial abnormality. Intermittently motion degraded exam, but overall stable noncontrast MRI appearance of the brain since 2016. Electronically Signed   By: Odessa Fleming M.D.   On: 02/08/2016 14:25   Dg Chest Port 1 View  Result Date: 02/09/2016 CLINICAL DATA:  Shortness of breath. EXAM: PORTABLE CHEST 1 VIEW COMPARISON:  02/07/2016 FINDINGS: Normal heart size and pulmonary vascularity. No focal airspace disease or consolidation in the lungs. No blunting of costophrenic angles. No pneumothorax. Mediastinal contours appear intact. Calcification of the aorta. IMPRESSION: No active disease. Electronically Signed   By: Burman Nieves M.D.   On: 02/09/2016 22:58   Dg Chest Portable 1 View  Result Date: 02/07/2016 CLINICAL DATA:  CHEST PAIN,HX HTN,DM,COPD,CHF,RENAL DZ EXAM: PORTABLE CHEST - 1 VIEW COMPARISON:  10/23/2014 FINDINGS: Low lung volumes.  No focal infiltrate or overt edema. Heart size upper limits normal for technique. Atheromatous aortic arch. No effusion. Visualized bones unremarkable. IMPRESSION: 1. Low volumes.  No acute disease. 2.  Aortic Atherosclerosis (ICD10-170.0) Electronically Signed   By: Corlis Leak  M.D.   On: 02/07/2016 12:11     Subjective: - no chest pain, shortness of breath, no abdominal pain, nausea or vomiting.    Discharge Exam: Vitals:   02/12/16 1530 02/12/16 1543  BP: (!) 144/82 (!) 155/86  Pulse: 82 82  Resp:  14  Temp:  98.8 F (37.1 C)   Vitals:   02/12/16 1443 02/12/16 1500 02/12/16 1530 02/12/16 1543  BP: (!) 142/82 133/80 (!) 144/82 (!) 155/86  Pulse: 83 81 82 82  Resp:    14  Temp:    98.8 F (37.1 C)  TempSrc:    Oral  SpO2:    100%  Weight:    60 kg (132 lb 4.4 oz)  Height:       General: Pt is alert, awake, not in acute distress Cardiovascular: RRR, S1/S2 +, no rubs, no gallops Respiratory: CTA bilaterally, no wheezing, no rhonchi Abdominal: Soft, NT, ND, bowel sounds +   The results of significant diagnostics from this hospitalization (including imaging, microbiology, ancillary and laboratory) are listed below for reference.     Microbiology: Recent Results (from the past 240 hour(s))  Urine culture     Status: Abnormal   Collection Time: 02/07/16 10:15 AM  Result Value Ref Range Status   Specimen Description URINE, CATHETERIZED  Final   Special Requests Normal  Final   Culture MULTIPLE SPECIES PRESENT, SUGGEST RECOLLECTION (A)  Final   Report Status 02/09/2016 FINAL  Final  MRSA PCR Screening     Status: None   Collection Time: 02/08/16  9:59 AM  Result Value Ref Range Status   MRSA by PCR NEGATIVE NEGATIVE Final    Comment:        The GeneXpert MRSA Assay (FDA approved for NASAL specimens only), is one component of a comprehensive MRSA colonization surveillance program. It is not intended to diagnose MRSA infection nor to guide or monitor treatment for MRSA infections.   Culture, blood (routine x 2)     Status: None (Preliminary result)   Collection Time: 02/09/16  9:27 AM  Result Value Ref Range Status   Specimen Description BLOOD RIGHT FOOT  Final   Special Requests IN PEDIATRIC BOTTLE 1CC  Final   Culture NO GROWTH 3  DAYS  Final   Report Status PENDING  Incomplete  Culture, blood (routine x 2)     Status: None (Preliminary result)   Collection Time: 02/09/16  9:37 AM  Result Value Ref Range Status   Specimen Description BLOOD RIGHT WRIST  Final   Special Requests IN PEDIATRIC BOTTLE .5CC  Final   Culture NO GROWTH 3 DAYS  Final   Report Status PENDING  Incomplete     Labs: BNP (last 3 results) No results for input(s): BNP in the last 8760 hours. Basic Metabolic Panel:  Recent Labs Lab 02/07/16 1714  02/09/16 1217 02/09/16 2230 02/10/16 0554 02/11/16 0423 02/12/16 1431  NA 136  < > 137 138 139 138 134*  K 4.9  < > 4.3 3.5 3.5 3.6 3.9  CL 100*  < > 102 103 105 100* 98*  CO2 19*  < > 22 22 23 25 22   GLUCOSE 566*  < > 308* 162* 90 119* 291*  BUN 24*  < > 45* 48* 52* 19 41*  CREATININE 4.33*  < > 5.39* 6.01* 6.27* 4.34* 6.75*  CALCIUM 9.3  < > 9.5 9.3 9.1 9.9 9.6  PHOS 4.5  --  4.3  --   --   --  4.0  < > = values in this interval not displayed. Liver Function Tests:  Recent Labs Lab 02/08/16 1532 02/09/16 0253 02/09/16 1217 02/09/16 2230 02/10/16 0554 02/11/16 0423 02/12/16 1431  AST 33 33  --  45* 47* 59*  --   ALT 20 20  --  18 22 27   --   ALKPHOS 169* 150*  --  135* 120 147*  --   BILITOT 0.5 0.6  --  0.4 0.7 0.7  --   PROT 7.3 6.2*  --  6.2* 5.8* 6.6  --   ALBUMIN 3.7 3.2* 3.4* 3.2* 3.0* 3.2* 3.1*    Recent Labs Lab 02/07/16 1000 02/08/16 0856 02/09/16 0935 02/10/16 0554  LIPASE 25 1,065* 273* 109*    Recent Labs Lab 02/10/16 1250  AMMONIA 24   CBC:  Recent Labs Lab 02/07/16 1000  02/09/16 1217 02/09/16 2230 02/10/16 0554 02/11/16 0423 02/12/16 1431  WBC 5.6  < > 9.5 10.1 8.1 8.3 6.6  NEUTROABS 4.5  --   --   --   --   --   --   HGB 12.2  < > 13.9 12.4 12.2 14.7 12.4  HCT 38.5  < > 43.8 39.6 38.0 46.2* 38.9  MCV 93.7  < > 93.4 94.1 92.9 94.3 93.1  PLT 129*  < > 147* 155 135* 144* 142*  < > = values in this interval not displayed. Cardiac  Enzymes:  Recent Labs Lab 02/08/16 0856 02/08/16 1532 02/08/16 2141  TROPONINI 0.03* 0.05* 0.03*   BNP: Invalid input(s): POCBNP CBG:  Recent Labs Lab 02/11/16 2005 02/11/16 2301 02/12/16 0340 02/12/16 0815 02/12/16 1639  GLUCAP 138* 125* 205* 154* 163*   D-Dimer No results for input(s): DDIMER in the last 72 hours. Hgb A1c No results for input(s): HGBA1C in the last 72 hours. Lipid Profile No results for input(s): CHOL, HDL, LDLCALC, TRIG, CHOLHDL, LDLDIRECT in the last 72 hours. Thyroid function studies No results for input(s): TSH, T4TOTAL, T3FREE, THYROIDAB in the last 72 hours.  Invalid input(s): FREET3 Anemia work up No results for input(s): VITAMINB12, FOLATE, FERRITIN, TIBC, IRON, RETICCTPCT in the last 72 hours. Urinalysis    Component Value Date/Time   COLORURINE YELLOW 02/07/2016 1006   APPEARANCEUR CLOUDY (A) 02/07/2016 1006   LABSPEC 1.021 02/07/2016 1006   PHURINE 7.5 02/07/2016 1006   GLUCOSEU >1000 (A) 02/07/2016 1006   HGBUR TRACE (A) 02/07/2016 1006   BILIRUBINUR NEGATIVE 02/07/2016 1006   KETONESUR 15 (A) 02/07/2016 1006   PROTEINUR 100 (A) 02/07/2016 1006   UROBILINOGEN 0.2 10/24/2014 0057   NITRITE NEGATIVE 02/07/2016 1006   LEUKOCYTESUR SMALL (A) 02/07/2016 1006   Sepsis Labs Invalid input(s): PROCALCITONIN,  WBC,  LACTICIDVEN Microbiology Recent Results (from the past 240 hour(s))  Urine culture     Status: Abnormal   Collection Time: 02/07/16 10:15 AM  Result Value Ref Range Status   Specimen Description URINE, CATHETERIZED  Final   Special Requests Normal  Final   Culture MULTIPLE SPECIES PRESENT, SUGGEST RECOLLECTION (A)  Final   Report Status 02/09/2016 FINAL  Final  MRSA PCR Screening     Status: None   Collection Time: 02/08/16  9:59 AM  Result Value Ref Range Status   MRSA by PCR NEGATIVE NEGATIVE Final    Comment:        The GeneXpert MRSA Assay (FDA approved for NASAL specimens only), is one component of  a comprehensive MRSA colonization surveillance program. It is not intended to diagnose MRSA infection nor to guide or monitor treatment for MRSA infections.   Culture, blood (routine x 2)     Status: None (Preliminary result)   Collection Time: 02/09/16  9:27 AM  Result Value Ref Range Status   Specimen Description BLOOD RIGHT FOOT  Final   Special Requests IN PEDIATRIC BOTTLE 1CC  Final   Culture NO GROWTH 3 DAYS  Final   Report Status PENDING  Incomplete  Culture, blood (routine x 2)     Status: None (Preliminary result)   Collection Time: 02/09/16  9:37 AM  Result Value Ref Range Status   Specimen Description BLOOD RIGHT WRIST  Final   Special Requests IN PEDIATRIC BOTTLE .5CC  Final   Culture NO GROWTH 3 DAYS  Final   Report Status PENDING  Incomplete     Time coordinating discharge: Over 30 minutes  SIGNED:  Pamella PertGHERGHE, Skye Plamondon, MD  [facility] 02/12/2016, 4:52 PM Pager [phone]  If 7PM-7AM, please contact night-coverage [email or web address] Password [email or web address]

## 2016-02-12 NOTE — Progress Notes (Signed)
Received order to d/c patient.  Patient completed HD.  Provided pt with discharge education.  Removed PIV.  Answered all questions.

## 2016-02-12 NOTE — Progress Notes (Signed)
Pt refusing cardiac monitoring.  Will continue to provide education.   

## 2016-02-12 NOTE — Progress Notes (Signed)
[facility] Progress Note   Dialysis: [facility] MWF 4h  63kg   3K/ 2.5 Ca bath  Prof4   Hep 4000   AVF LUA Aranesp 40 mcg IV q week (should have gotten dose 02/05/16-last actual dose documented 09/20/017 HGB 12.0 02/05/16) Venofer 50 mg IV (last dose 02/05/16 fe101 Tsat 39% 02/05/16   Assessment/ Plan:     1. DKA: off insulin gtt, getting [place] insulin. 2. Acute Pancreatitis: lipase 1000 > 100 , pain much better, taking solid foods. Not sure etiology, gallbladder on CT was normal.  Had "focal pancreatitis" last year as well.  3.  ESRD -  MWF [facility] 4.  Hypertension - bp's soft, holding home meds (coreg/ norvasc) 5. Volume - need standing wt, euvolemic on exam. 6.  Anemia  -HGB 12.2. No ESA needed. Follow HGB 7.  Metabolic bone disease -  Hold binders-last phos 2.9 02/05/16, Cont VDRA. Ca 9.5 C Ca 9.74 PTH 366 02/05/16 8.  Nutrition - taking solids  Plan - HD today, ok for dc from renal standpoint.  Have d/w primary MD.   Vinson Moselle MD [facility] pager [phone]   02/12/2016, 12:26 PM   Subjective:    Feeling better. Abd pain better. On HD, no c/o.    Objective:   BP 131/67 (BP Location: Right Arm)   Pulse 69   Temp 98.5 F (36.9 C) (Oral)   Resp (!) 22   Ht 4\' 9"  (1.448 m)   Wt 69.7 kg (153 lb 10.6 oz)   SpO2 100%   BMI 33.25 kg/m   Physical Exam: General: chronically ill appearing female, awake and alert Head: Normocephalic, atraumatic, sclera non-icteric, mucus membranes are moist Neck: Supple. JVD not elevated. Lungs: Clear bilaterally to auscultation without wheezes, rales, or rhonchi. Breathing is unlabored. Heart: RRR with S1 S2. No murmurs, rubs, or gallops appreciated. SR on monitor. Abdomen: Soft, active bowel sounds, nontender.  Improved from yesterday. Lower extremities:without edema or ischemic changes, no open wounds  Neuro: alert, oriented, improved from yesterday Dialysis Access: LUA AVF +Bruit  Labs: BMET  Recent  Labs Lab 02/07/16 1714  02/08/16 1532 02/08/16 1625 02/09/16 0253 02/09/16 1217 02/09/16 2230 02/10/16 0554 02/11/16 0423  NA 136  < > 134* 134* 137 137 138 139 138  K 4.9  < > 4.4 3.7 3.8 4.3 3.5 3.5 3.6  CL 100*  < > 100* 99* 101 102 103 105 100*  CO2 19*  < > 20* 22 24 22 22 23 25   GLUCOSE 566*  < > 131* 104* 220* 308* 162* 90 119*  BUN 24*  < > 32* 31* 39* 45* 48* 52* 19  CREATININE 4.33*  < > 4.12* 3.97* 5.10* 5.39* 6.01* 6.27* 4.34*  CALCIUM 9.3  < > 10.1 10.0 9.6 9.5 9.3 9.1 9.9  PHOS 4.5  --   --   --   --  4.3  --   --   --   < > = values in this interval not displayed. CBC  Recent Labs Lab 02/07/16 1000  02/09/16 1217 02/09/16 2230 02/10/16 0554 02/11/16 0423  WBC 5.6  < > 9.5 10.1 8.1 8.3  NEUTROABS 4.5  --   --   --   --   --   HGB 12.2  < > 13.9 12.4 12.2 14.7  HCT 38.5  < > 43.8 39.6 38.0 46.2*  MCV 93.7  < > 93.4 94.1 92.9 94.3  PLT 129*  < > 147*  155 135* 144*  < > = values in this interval not displayed.  @IMGRELPRIORS @ Medications:    . ferric gluconate (FERRLECIT/NULECIT) IV  62.5 mg Intravenous Weekly  . heparin  5,000 Units Subcutaneous Q8H  . insulin aspart  0-9 Units Subcutaneous Q4H  . insulin aspart  3 Units Subcutaneous TID WC  . insulin glargine  18 Units Subcutaneous q1800  . ondansetron (ZOFRAN) IV  4 mg Intravenous Q6H  . pantoprazole  40 mg Oral Daily  . senna  1 tablet Oral BID  . sodium chloride flush  3 mL Intravenous Q12H  . sucralfate  1 g Oral TID WC & HS

## 2016-02-14 LAB — CULTURE, BLOOD (ROUTINE X 2)
CULTURE: NO GROWTH
CULTURE: NO GROWTH

## 2016-03-10 ENCOUNTER — Telehealth: Payer: Self-pay | Admitting: *Deleted

## 2016-03-10 ENCOUNTER — Ambulatory Visit (INDEPENDENT_AMBULATORY_CARE_PROVIDER_SITE_OTHER): Payer: Medicaid Other | Admitting: Internal Medicine

## 2016-03-10 ENCOUNTER — Encounter: Payer: Self-pay | Admitting: Internal Medicine

## 2016-03-10 DIAGNOSIS — B182 Chronic viral hepatitis C: Secondary | ICD-10-CM

## 2016-03-10 MED ORDER — ELBASVIR-GRAZOPREVIR 50-100 MG PO TABS: 1.0000 | ORAL_TABLET | Freq: Every day | ORAL | 2 refills | Status: DC

## 2016-03-10 NOTE — Telephone Encounter (Signed)
Prior Authorization for abdominal ultrasound approved, PA # [id number] through 11/31/17. [facility] and hold time was over 30 minutes. Will try again in the AM. Wendall MolaJacqueline Grover [place]

## 2016-03-10 NOTE — Patient Instructions (Signed)
Date 03/10/16  Dear Ms Murgia, As discussed in the ID Clinic, your hepatitis C therapy will include the following medications:          Zepatier (elbasvir 50 mg/grazoprevir 100 mg) for 12 weeks               Please note that ALL MEDICATIONS WILL START ON THE SAME DATE for a total of 12 weeks. ---------------------------------------------------------------- Your HCV Treatment Start Date: TBA   Your HCV genotype:  1b    Liver Fibrosis: F4   ---------------------------------------------------------------- YOUR PHARMACY CONTACT:   [facility] [address] Phone: [phone] Hours: Monday to Friday 7:30 am to 6:00 pm   Please always contact your pharmacy at least 3-4 business days before you run out of medications to ensure your next month's medication is ready or 1 week prior to running out if you receive it by mail.  Remember, each prescription is for 28 days. ---------------------------------------------------------------- GENERAL NOTES REGARDING YOUR HEPATITIS C MEDICATION:  ZEPATIER is available as a beige-colored, oval-shaped, film-coated tablet debossed with "770" on one side and plain on the other. Each tablet contains 50 mg elbasvir and 100 mg grazoprevir.   Common side effects of ZEPATIER when used without ribavirin include: - feeling tired -trouble sleeping - headache -diarrhea - nausea  Common side effects of ZEPATIER when used with ribavirin include: - low red blood cell counts (anemia) - feeling irritable - headache - stomach pain - feeling tired - depression - shortness of breath - joint pain - rash or itching   Please note that this only lists the most common side effects and is NOT a comprehensive list of the potential side effects of these medications. For more information, please review the drug information sheets that come with your medication package from the pharmacy.   ---------------------------------------------------------------- GENERAL HELPFUL HINTS ON HCV THERAPY: 1. Stay well-hydrated. 2. Notify the ID Clinic of any changes in your other over-the-counter/herbal or prescription medications. 3. If you miss a dose of your medication, take the missed dose as soon as you remember. Return to your regular time/dose schedule the next day.  4.  Do not stop taking your medications without first talking with your healthcare provider. 5.  You may take Tylenol (acetaminophen), as long as the dose is less than 2000 mg (OR no more than 4 tablets of the Tylenol Extra Strengths 500mg  tablet) in 24 hours. 6.  You will see our pharmacist-specialist within the first 2 weeks of starting your medication. 7.  You will be scheduled for labs once during treatment, soon after treatment completion and 6 months or more after treatment completion to verify the virus is out of your system.   8.  If ribavirin is part of your regimen, you may have a lab visit every 2 weeks.   Staci RighterOMER, ROBERT, MD  [facility] [address] [place], [place]  [phone]

## 2016-03-10 NOTE — Progress Notes (Signed)
[facility]   CC: consideration for treatment for chronic hepatitis C  HPI:  +Madeline [name] is a 60 y.o. female who presents for initial evaluation and management of chronic hepatitis C.  Patient tested positive earlier this year. Hepatitis C-associated risk factors present are: none. Patient denies history of blood transfusion, IV drug abuse, sexual contact with person with liver disease, tattoos. Patient has had other studies performed. Results: hepatitis C RNA by PCR, result: positive. Patient has not had prior treatment for Hepatitis C. Patient does not have a past history of liver disease. Patient does not have a family history of liver disease. Patient does not  have associated signs or symptoms related to liver disease.  Labs reviewed and confirm chronic hepatitis C with a positive viral load.   Records reviewed from hospital.  She has had issues with compliance for dialysis but is here now with a family member and compliant with TTHS dialysis now.  Arrived more than 30 minutes late.       Patient does not have documented immunity to Hepatitis A. Patient does have documented immunity to Hepatitis B.    Review of Systems:  Constitutional: negative for fatigue and malaise Gastrointestinal: negative for diarrhea Musculoskeletal: negative for myalgias and arthralgias All other systems reviewed and are negative      Past Medical History:  Diagnosis Date  . Abnormal Doppler ultrasound of carotid artery    a. Per [place] records: <50% LICA.  Marland Kitchen Anasarca    a. Per [place] records - due to pulm HTN with R HF.   Marland Kitchen Aseptic meningitis    a. 09/2012: adm in [place] for metabolic encephalopathy, oliguric tubular necrosis, anemia, HTN, possible CVA, HHNKA.  Marland Kitchen CHF (congestive heart failure) (HCC)    a. HFpEF with RHF/anasarca/pHTN.  . CKD (chronic kidney disease), stage III    a. Per [place] records: h/o ARF after CTA that ruled out PE.  Marland Kitchen COPD (chronic obstructive pulmonary disease)  (HCC)   . Coronary artery disease    a. Per [place] records: NSTEMI 01/2012, tx medically given ARF but suspected CAD. b. Stress test 12/16/11 reported w/o ischemia.  Marland Kitchen CVA (cerebral infarction)    a. Per [place] records, "possible CVA" 09/2012 but MRI reportedly negative.  . Diabetes mellitus (HCC)   . Dialysis patient [facility])    Mon, Wed, Fridays  . Hypertension   . OSA (obstructive sleep apnea)    a. Pt reported used to use CPAP in , but "ran out" when came to [facility].  Marland Kitchen Pulmonary hypertension    a. RHC 02/28/13: mod pulm HTN with normal PVR suggestive of predominantly pulmonary venous HTN.  Marland Kitchen Renal insufficiency    dialysis 3 days/week  . Right heart failure   . Stroke [facility]) 2014    Prior to Admission medications   Medication Sig Start Date End Date Taking? Authorizing Provider  acetaminophen (TYLENOL) 500 MG tablet Take 500-1,000 mg by mouth every 6 (six) hours as needed for moderate pain.   Yes Historical Provider, MD  albuterol (PROVENTIL HFA;VENTOLIN HFA) 108 (90 BASE) MCG/ACT inhaler Inhale 2 puffs into the lungs every 6 (six) hours as needed for wheezing or shortness of breath.    Yes Historical Provider, MD  amLODipine (NORVASC) 10 MG tablet Take 10 mg by mouth daily.    Yes Historical Provider, MD  aspirin EC 81 MG tablet Take 81 mg by mouth daily.   Yes Historical Provider, MD  atorvastatin (LIPITOR) 40  MG tablet Take 40 mg by mouth daily.   Yes Historical Provider, MD  calcitRIOL (ROCALTROL) 0.25 MCG capsule Take 0.25 mcg by mouth daily.   Yes Historical Provider, MD  calcium acetate (PHOSLO) 667 MG capsule Take 667 mg by mouth 3 (three) times daily with meals.   Yes Historical Provider, MD  carvedilol (COREG) 25 MG tablet Take 25 mg by mouth 2 (two) times daily with a meal.   Yes Historical Provider, MD  insulin glargine (LANTUS) 100 UNIT/ML injection Inject 0.17 mLs (17 Units total) into the skin at bedtime. 02/12/16  Yes Costin Otelia SergeantM Gherghe, MD  insulin lispro (HUMALOG) 100 UNIT/ML injection  Inject 3-10 Units into the skin 3 (three) times daily before meals. Sliding scale.  CBG 200: 3 UNITS, 250 5 UNITS, 300 7 UNITS, 350 9-10 UNITS   Yes Historical Provider, MD  metoCLOPramide (REGLAN) 5 MG tablet Take 1 tablet (5 mg total) by mouth 4 (four) times daily -  before meals and at bedtime. 10/26/14  Yes Jessica U Vann, DO  nitroGLYCERIN (NITROSTAT) 0.4 MG SL tablet PLACE ONE TABLET UNDER THE TONGUE EVERY FIVE MINUTES AS NEEDED FOR CHEST PAIN 07/11/15  Yes Dolores Pattyaniel R Bensimhon, MD  pantoprazole (PROTONIX) 40 MG tablet Take 1 tablet (40 mg total) by mouth 2 (two) times daily. 06/05/13  Yes Coolidge Breezeachel E Chikowski, MD  polyethylene glycol (MIRALAX / GLYCOLAX) packet Take 17 g by mouth daily. 10/16/14  Yes Zannie CovePreetha Joseph, MD  promethazine (PHENERGAN) 25 MG tablet Take 25 mg by mouth 2 (two) times daily as needed for nausea or vomiting.    Yes Historical Provider, MD  sevelamer carbonate (RENVELA) 800 MG tablet Take 800 mg by mouth 3 (three) times daily with meals.   Yes Historical Provider, MD  traMADol (ULTRAM) 50 MG tablet Take by mouth 2 (two) times daily. prn   Yes Historical Provider, MD  Elbasvir-Grazoprevir (ZEPATIER) 50-100 MG TABS Take 1 tablet by mouth daily. 03/10/16   Gardiner Barefootobert W Shuaib Corsino, MD    No Known Allergies  Social History  Substance Use Topics  . Smoking status: Former Games developermoker  . Smokeless tobacco: Current User    Types: Snuff  . Alcohol use No    Family History  Problem Relation Age of Onset  . Diabetes Mellitus II Sister   . Diabetes Mellitus II Brother   . CAD Brother   No liver cirrhosis   Objective:  Constitutional: in no apparent distress,  Vitals:   03/10/16 1043  BP: (!) 175/92  Pulse: 77   Eyes: anicteric Cardiovascular: Cor RRR and No murmurs Respiratory: CTA B; normal respiratory effot Gastrointestinal: Bowel sounds are normal, liver is not enlarged, spleen is not enlarged Musculoskeletal: no pedal edema noted Skin: negatives: no rash; no porphyria cutanea  tarda Lymphatic: no cervical lymphadenopathy   Laboratory Genotype: No results found for: HCVGENOTYPE HCV viral load: No results found for: HCVQUANT Lab Results  Component Value Date   WBC 6.6 02/12/2016   HGB 12.4 02/12/2016   HCT 38.9 02/12/2016   MCV 93.1 02/12/2016   PLT 142 (L) 02/12/2016    Lab Results  Component Value Date   CREATININE 6.75 (H) 02/12/2016   BUN 41 (H) 02/12/2016   NA 134 (L) 02/12/2016   K 3.9 02/12/2016   CL 98 (L) 02/12/2016   CO2 22 02/12/2016    Lab Results  Component Value Date   ALT 27 02/11/2016   AST 59 (H) 02/11/2016   ALKPHOS 147 (H) 02/11/2016  Labs and history reviewed and show CHILD-PUGH A  5-6 points: Child class A 7-9 points: Child class B 10-15 points: Child class C  Lab Results  Component Value Date   INR 1.1 02/04/2016   BILITOT 0.7 02/11/2016   ALBUMIN 3.1 (L) 02/12/2016     Assessment: New Patient with Chronic Hepatitis C genotype 1b, untreated.  I discussed with the patient the lab findings that confirm chronic hepatitis C as well as the natural history and progression of disease including about 30% of people who develop cirrhosis of the liver if left untreated and once cirrhosis is established there is a 2-7% risk per year of liver cancer and liver failure.  I discussed the importance of treatment and benefits in reducing the risk, even if significant liver fibrosis exists.   Plan: 1) Patient counseled extensively on limiting acetaminophen to no more than 2 grams daily, avoidance of alcohol. 2) Transmission discussed with patient including sexual transmission, sharing razors and toothbrush.   3) Will need referral to gastroenterology if concern for cirrhosis 4) Will need referral for substance abuse counseling: No.; Further work up to include urine drug screen  No. 5) Will prescribe Zepatier for 12 weeks or 16 weeks with ribavirin if any NS5A resistance found 6) Hepatitis A vaccine Yes.   7) Hepatitis B vaccine  No. 8) Pneumovax vaccine if concern for cirrhosis 9) Further work up to include liver staging with elastography 10) NS5A test  No. 10) will follow up after starting medication

## 2016-03-11 NOTE — Telephone Encounter (Signed)
Called patient and notified her daughter of appt for ultrasound on 03/19/16 at 7:40 AM. Nothing to eat or drink after midnight. [name]

## 2016-03-16 ENCOUNTER — Encounter: Payer: Self-pay | Admitting: Pharmacist

## 2016-03-16 ENCOUNTER — Encounter (HOSPITAL_COMMUNITY): Payer: Self-pay | Admitting: Internal Medicine

## 2016-03-16 ENCOUNTER — Ambulatory Visit (HOSPITAL_COMMUNITY)
Admission: RE | Admit: 2016-03-16 | Discharge: 2016-03-16 | Disposition: A | Discharge status: Home / Self Care | Payer: Medicaid Other | Source: Ambulatory Visit | Attending: Internal Medicine | Admitting: Internal Medicine

## 2016-03-16 ENCOUNTER — Other Ambulatory Visit: Payer: Self-pay | Admitting: Pharmacist

## 2016-03-16 VITALS — BP 142/68 | HR 70 | Wt 140.5 lb

## 2016-03-16 DIAGNOSIS — Z79899 Other long term (current) drug therapy: Secondary | ICD-10-CM | POA: Insufficient documentation

## 2016-03-16 DIAGNOSIS — B182 Chronic viral hepatitis C: Secondary | ICD-10-CM

## 2016-03-16 DIAGNOSIS — I5032 Chronic diastolic (congestive) heart failure: Secondary | ICD-10-CM | POA: Diagnosis not present

## 2016-03-16 DIAGNOSIS — I10 Essential (primary) hypertension: Secondary | ICD-10-CM | POA: Diagnosis not present

## 2016-03-16 MED ORDER — GLECAPREVIR-PIBRENTASVIR 100-40 MG PO TABS: 3.0000 | ORAL_TABLET | Freq: Every day | ORAL | 2 refills | Status: DC

## 2016-03-16 MED ORDER — ROSUVASTATIN CALCIUM 10 MG PO TABS: 10.0000 mg | ORAL_TABLET | Freq: Every day | ORAL | 11 refills | Status: AC

## 2016-03-16 NOTE — Progress Notes (Signed)
Patient ID: Madeline [name], female   DOB: 1956/02/17, 60 y.o.   MRN: [id number]    Advanced Heart Failure Clinic Note    PCP: Dr Andrey Campanile Nephrologist: Dr Reginal Lutes Pulmonologist: None  HPI: Madeline [name] is a 60 y/o F with history of HFpEF (RHF/anasarca with pulmonary HTN), ESRD, suspected CAD, DM, chronic anemia, OSA (not recently using CPAP) who is originally from [facility] who moved to [facility] in May 2014. Her daughter is former Film/video editor at [employer]. Returns for a yearly f/u.  Admitted to [facility] 02/14/13 through 03/05/13 with acute respiratory failure due to volume overload complicated by CKD. ECHO 10/8/14EF 65% RV dilated.  Lasix drip used but ultimately required HD. AVF placed while hospitalized. Had acute blood loss and received 3UPRBCs.  D/C weight 150 pounds.    RHC 02/28/13 RA = 14  RV = 75/8/18  PA = 69/16 (43)  PCW = 29 with v-waves to 50 (check on in R and L PAs)  Fick cardiac output/index = 5.4/3.1  PVR = 2.8  FA sat = 97%  PA sat = 56%, 57%    She returns today for yearly follow up.  Last seen in HF clinic 06/2014. Recently admitted end of 01/2016 for vomiting, thought to be 2/2 acute pancreatitis. MRCP with no clear cause.   Per notes, patient is "notoriously non-compliant" with her HD sessions. Now going to another HD Center and doing better with compliance. Denies SOB/PND/Orthopnea. HD T-Th-Sat. Dry weight 60-61 kg. Walks with a rolling walker or cane.  Daughter prepares pill box.    ROS: All systems negative except as listed in HPI, PMH and Problem List.  Past Medical History:  Diagnosis Date  . Abnormal Doppler ultrasound of carotid artery    a. Per [place] records: <50% LICA.  Marland Kitchen Anasarca    a. Per [place] records - due to pulm HTN with R HF.   Marland Kitchen Aseptic meningitis    a. 09/2012: adm in [place] for metabolic encephalopathy, oliguric tubular necrosis, anemia, HTN, possible CVA, HHNKA.  Marland Kitchen CHF (congestive heart failure) (HCC)    a. HFpEF with RHF/anasarca/pHTN.  . CKD (chronic  kidney disease), stage III    a. Per [place] records: h/o ARF after CTA that ruled out PE.  Marland Kitchen COPD (chronic obstructive pulmonary disease) (HCC)   . Coronary artery disease    a. Per [place] records: NSTEMI 01/2012, tx medically given ARF but suspected CAD. b. Stress test 12/16/11 reported w/o ischemia.  Marland Kitchen CVA (cerebral infarction)    a. Per [place] records, "possible CVA" 09/2012 but MRI reportedly negative.  . Diabetes mellitus (HCC)   . Dialysis patient [facility])    Mon, Wed, Fridays  . Hypertension   . OSA (obstructive sleep apnea)    a. Pt reported used to use CPAP in [place], but "ran out" when came to [facility].  Marland Kitchen Pulmonary hypertension    a. RHC 02/28/13: mod pulm HTN with normal PVR suggestive of predominantly pulmonary venous HTN.  Marland Kitchen Renal insufficiency    dialysis 3 days/week  . Right heart failure   . Stroke [facility]) 2014    Current Outpatient Prescriptions  Medication Sig Dispense Refill  . acetaminophen (TYLENOL) 500 MG tablet Take 500-1,000 mg by mouth every 6 (six) hours as needed for moderate pain.    Marland Kitchen albuterol (PROVENTIL HFA;VENTOLIN HFA) 108 (90 BASE) MCG/ACT inhaler Inhale 2 puffs into the lungs every 6 (six) hours as needed for wheezing or shortness of breath.     Marland Kitchen  amLODipine (NORVASC) 10 MG tablet Take 10 mg by mouth daily.     Marland Kitchen. aspirin EC 81 MG tablet Take 81 mg by mouth daily.    . calcitRIOL (ROCALTROL) 0.25 MCG capsule Take 0.25 mcg by mouth daily.    . calcium acetate (PHOSLO) 667 MG capsule Take 667 mg by mouth 3 (three) times daily with meals.    . carvedilol (COREG) 25 MG tablet Take 25 mg by mouth 2 (two) times daily with a meal.    . Glecaprevir-Pibrentasvir (MAVYRET) 100-40 MG TABS Take 3 tablets by mouth daily with breakfast. 84 tablet 2  . insulin glargine (LANTUS) 100 UNIT/ML injection Inject 0.17 mLs (17 Units total) into the skin at bedtime. 10 mL 11  . insulin lispro (HUMALOG) 100 UNIT/ML injection Inject 3-10 Units into the skin 3 (three) times daily before meals. Sliding  scale.  CBG 200: 3 UNITS, 250 5 UNITS, 300 7 UNITS, 350 9-10 UNITS    . metoCLOPramide (REGLAN) 5 MG tablet Take 1 tablet (5 mg total) by mouth 4 (four) times daily -  before meals and at bedtime. 90 tablet 0  . nitroGLYCERIN (NITROSTAT) 0.4 MG SL tablet PLACE ONE TABLET UNDER THE TONGUE EVERY FIVE MINUTES AS NEEDED FOR CHEST PAIN 30 tablet 3  . pantoprazole (PROTONIX) 40 MG tablet Take 1 tablet (40 mg total) by mouth 2 (two) times daily. 60 tablet 1  . polyethylene glycol (MIRALAX / GLYCOLAX) packet Take 17 g by mouth daily. 14 each 0  . promethazine (PHENERGAN) 25 MG tablet Take 25 mg by mouth 2 (two) times daily as needed for nausea or vomiting.     . rosuvastatin (CRESTOR) 10 MG tablet Take 1 tablet (10 mg total) by mouth daily. 30 tablet 11  . sevelamer carbonate (RENVELA) 800 MG tablet Take 800 mg by mouth 3 (three) times daily with meals.    . traMADol (ULTRAM) 50 MG tablet Take by mouth 2 (two) times daily. prn     No current facility-administered medications for this encounter.      PHYSICAL EXAM: Vitals:   03/16/16 1400  BP: (!) 142/68  Pulse: 70  SpO2: 99%  Weight: 140 lb 8 oz (63.7 kg)   Wt Readings from Last 3 Encounters:  03/16/16 140 lb 8 oz (63.7 kg)  03/10/16 141 lb (64 kg)  02/12/16 132 lb 4.4 oz (60 kg)     General:  Appears older than stated age.  NAD.  Ambulated into clinic without difficulty HEENT: Normal Neck: supple. JVP does not appear elevated. Carotids 2+ bilaterally; no bruits. Thyroid full on exam, with apparent goiter on R side.   Cor: PMI normal. RRR. No rubs or gallops noted.  TR 2/6 Lungs: CTAB, normal effort Abdomen: soft, NT, ND, no HSM. No bruits or masses. +BS  Extremities: no cyanosis, clubbing, rash. No peripheral edema. LUE AVG Neuro: alert & orientedx3, cranial nerves grossly intact. Moves all 4 extremities w/o difficulty. Affect pleasant  ASSESSMENT & PLAN: 1. Chronic Diastolic Heart Failure ECHO 02/2013 EF 65%  - Volume status stable.  Managed by HD on T/Th/Sat - Has been 3 years since last echo, will repeat.  2. HTN - Mildly elevated in clinic today, though she got lost on way into office.  - Continue amlodipine 10 mg daily.  - Continue coreg 25 mg BID.  - Renal to adjust as needed 3. ESRD - Remains on HD M/W/F - Sees Dr. Kathrene BongoGoldsborough.    4.  Anemia- On Iron.  5. OSA  - Non-compliant with CPAP.   6. Thyroid fullness - Needs to see PCP for potential US.   Can follow up as needed.   Graciella FreerMichael Andrew Tillery PA-C 2:03 PM  Patient seen and examined with Otilio SaberAndy Tillery, PA-C. We discussed all aspects of the encounter. I agree with the assessment and plan as stated above.   She is doing well. Volume status well managed with ESRD. BP up slightly but being managed by Renal. Repeat echo to ensure stability of LV function. Will follow on PRN basis.   Atreus Hasz,MD 10:46 PM

## 2016-03-16 NOTE — Patient Instructions (Signed)
Your physician has requested that you have an echocardiogram. Echocardiography is a painless test that uses sound waves to create images of your heart. It provides your doctor with information about the size and shape of your heart and how well your heart's chambers and valves are working. This procedure takes approximately one hour. There are no restrictions for this procedure.  We will contact you in 1 year to schedule your next appointment.  

## 2016-03-16 NOTE — Progress Notes (Signed)
Patient ID: Madeline [name], female   DOB: 1955/10/13, 60 y.o.   MRN: [id number]  Pt will need Mavyret x 12 weeks since she is F4 and Medicaid.  She is on lipitor 40.  Called and spoke to patient and changed her to crestor 10.  She is willing to change. Sent in Rx for Mavyret.

## 2016-03-19 ENCOUNTER — Ambulatory Visit
Admission: RE | Admit: 2016-03-19 | Discharge: 2016-03-19 | Disposition: A | Discharge status: Home / Self Care | Payer: Medicaid Other | Source: Ambulatory Visit | Attending: Internal Medicine | Admitting: Internal Medicine

## 2016-03-19 DIAGNOSIS — B182 Chronic viral hepatitis C: Secondary | ICD-10-CM

## 2016-03-19 MED FILL — MAVYRET 100-40 MG TABS: 100-40 | 28 days supply | Qty: 84 | Fill #0

## 2016-03-26 ENCOUNTER — Other Ambulatory Visit: Payer: Self-pay | Admitting: Family Medicine

## 2016-03-26 DIAGNOSIS — E049 Nontoxic goiter, unspecified: Secondary | ICD-10-CM

## 2016-03-27 ENCOUNTER — Encounter: Payer: Self-pay | Admitting: Pharmacy Technician

## 2016-04-06 ENCOUNTER — Other Ambulatory Visit: Payer: Medicaid Other

## 2016-04-07 ENCOUNTER — Ambulatory Visit (HOSPITAL_COMMUNITY): Payer: Medicaid Other | Attending: Internal Medicine

## 2016-04-07 ENCOUNTER — Other Ambulatory Visit: Payer: Self-pay

## 2016-04-07 DIAGNOSIS — I252 Old myocardial infarction: Secondary | ICD-10-CM | POA: Insufficient documentation

## 2016-04-07 DIAGNOSIS — Z87891 Personal history of nicotine dependence: Secondary | ICD-10-CM | POA: Diagnosis not present

## 2016-04-07 DIAGNOSIS — I071 Rheumatic tricuspid insufficiency: Secondary | ICD-10-CM | POA: Diagnosis not present

## 2016-04-07 DIAGNOSIS — I110 Hypertensive heart disease with heart failure: Secondary | ICD-10-CM | POA: Insufficient documentation

## 2016-04-07 DIAGNOSIS — I5032 Chronic diastolic (congestive) heart failure: Secondary | ICD-10-CM | POA: Diagnosis present

## 2016-04-07 DIAGNOSIS — E119 Type 2 diabetes mellitus without complications: Secondary | ICD-10-CM | POA: Insufficient documentation

## 2016-04-13 ENCOUNTER — Other Ambulatory Visit (HOSPITAL_COMMUNITY): Payer: Self-pay | Admitting: Internal Medicine

## 2016-04-21 ENCOUNTER — Ambulatory Visit: Payer: Medicaid Other

## 2016-04-22 MED FILL — MAVYRET 100-40 MG TABS: 100-40 | 28 days supply | Qty: 84 | Fill #1

## 2016-04-27 ENCOUNTER — Ambulatory Visit (INDEPENDENT_AMBULATORY_CARE_PROVIDER_SITE_OTHER): Payer: Medicaid Other | Admitting: Pharmacist Clinician (PhC)/ Clinical Pharmacy Specialist

## 2016-04-27 DIAGNOSIS — B182 Chronic viral hepatitis C: Secondary | ICD-10-CM | POA: Diagnosis present

## 2016-04-27 NOTE — Patient Instructions (Signed)
Take 3 tablets daily with food from now on Come back in 1 month for labs again

## 2016-04-27 NOTE — Progress Notes (Signed)
HPI: Madeline [name] is a 60 y.o. female is here fo f/u with pharmacy for her hep C.   No results found for: HCVGENOTYPE, HEPCGENOTYPE  Allergies: No Known Allergies  Vitals:    Past Medical History: Past Medical History:  Diagnosis Date  . Abnormal Doppler ultrasound of carotid artery    a. Per [place] records: <50% LICA.  Marland Kitchen. Anasarca    a. Per [place] records - due to pulm HTN with R HF.   Marland Kitchen. Aseptic meningitis    a. 09/2012: adm in [place] for metabolic encephalopathy, oliguric tubular necrosis, anemia, HTN, possible CVA, HHNKA.  Marland Kitchen. CHF (congestive heart failure) (HCC)    a. HFpEF with RHF/anasarca/pHTN.  . CKD (chronic kidney disease), stage III    a. Per [place] records: h/o ARF after CTA that ruled out PE.  Marland Kitchen. COPD (chronic obstructive pulmonary disease) (HCC)   . Coronary artery disease    a. Per [place] records: NSTEMI 01/2012, tx medically given ARF but suspected CAD. b. Stress test 12/16/11 reported w/o ischemia.  Marland Kitchen. CVA (cerebral infarction)    a. Per  records, "possible CVA" 09/2012 but MRI reportedly negative.  . Diabetes mellitus (HCC)   . Dialysis patient [facility](HCC)    Mon, Wed, Fridays  . Hypertension   . OSA (obstructive sleep apnea)    a. Pt reported used to use CPAP in , but [facility]  Marland Kitchen. Pulmonary hypertension    a. RHC 02/28/13: mod pulm HTN with normal PVR suggestive of predominantly pulmonary venous HTN.  Marland Kitchen. Renal insufficiency    dialysis 3 days/week  . Right heart failure   . Stroke [facility](HCC) 2014    Social History: Social History   Social History  . Marital status: Widowed    Spouse name: N/A  . Number of children: 4  . Years of education: 9   Occupational History  .      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 Smoking status: Former Games developermoker  . Smokeless tobacco: Current User    Types: Snuff  . Alcohol use No  . Drug use: No  . Sexual activity: No   Other Topics Concern  . Not on file   Social History Narrative   Lives at home with sister    Caffeine use- none    Labs: Hep B S Ab (no units)  Date Value  02/04/2016 POS (A)   Hepatitis B Surface Ag (no units)  Date Value  02/04/2016 NEGATIVE   HCV Ab (no units)  Date Value  02/27/2013 Reactive (A)    No results found for: HCVGENOTYPE, HEPCGENOTYPE  No flowsheet data found.  AST (U/L)  Date Value  02/11/2016 59 (H)  02/10/2016 47 (H)  02/09/2016 45 (H)   ALT (U/L)  Date Value  02/11/2016 27  02/10/2016 22  02/09/2016 18  02/04/2016 18   INR (no units)  Date Value  02/04/2016 1.1  10/11/2014 1.28  02/14/2013 0.93    CrCl: CrCl cannot be calculated (Patient's most recent lab result is older than the maximum 21 days allowed.).  Fibrosis Score: F4 as assessed by Fibrosure  Child-Pugh Score: Class A  Previous Treatment Regimen: Naive  Assessment: Madeline [name] started on Mavyret 11/17 for her 1b hep C. She is here with her daughter. The original plan was to use Zepatier since she is a ESRD pt. However, Medicaid rejected zepatier due to formlary status with Mavyret. Cassie called the daughter to tell her that  we had to change to Mavyret and how to take it. We also had to change her statin. Come to find out she has been ONLY taking 1 tablet daily instead of 3 with Mavyret. We had a really hard time bringing her in for the 2 wks appt. She missed several appts. We can only hope for the best at this point since she has the 1b virus.   Explained the meaning of the fibrosis score to her and her daughter. We are going to get lab today and bringing her back for repeat labs in month to monitor for failure.   Recommendations:  Hep C VL today Make to take Mavyret 3 tabs daily instead of 1 F/u with pharmacy in 1 month for labs  Ulyses [place], Diego Ulbricht LebanonQuang, VermontPharm.D., BCPS, AAHIVP Clinical Infectious Disease Pharmacist [facility] 04/27/2016, 10:29 AM

## 2016-04-29 LAB — HEPATITIS C RNA QUANTITATIVE
HCV QUANT: 257 [IU]/mL — AB (ref <15)
HCV Quantitative Log: 2.41 {Log} — ABNORMAL HIGH (ref <1.18)

## 2016-05-18 MED FILL — MAVYRET 100-40 MG TABS: 100-40 | 28 days supply | Qty: 84 | Fill #2

## 2016-06-01 ENCOUNTER — Ambulatory Visit: Payer: Medicaid Other

## 2016-06-08 ENCOUNTER — Telehealth: Payer: Self-pay | Admitting: Pharmacist

## 2016-06-08 NOTE — Telephone Encounter (Signed)
Called and spoke to Madeline [name]'s daughter and rescheduled her appointment that was cancelled do to the clinic flood.  Rescheduled it for Wednesday 2/7 at 10:30am.

## 2016-06-17 ENCOUNTER — Ambulatory Visit: Payer: Medicaid Other

## 2016-06-19 ENCOUNTER — Ambulatory Visit (INDEPENDENT_AMBULATORY_CARE_PROVIDER_SITE_OTHER): Payer: Medicaid Other | Admitting: Pharmacist Clinician (PhC)/ Clinical Pharmacy Specialist

## 2016-06-19 DIAGNOSIS — B182 Chronic viral hepatitis C: Secondary | ICD-10-CM

## 2016-06-19 NOTE — Patient Instructions (Addendum)
Finish out your Mavyret Come back on 5/30 for the cure lab Come back on 6/20 to follow up with Dr. Luciana Axeomer

## 2016-06-19 NOTE — Progress Notes (Signed)
HPI: Madeline [name] is a 61 y.o. female who is here with her daughter to f/u with us for her hep C.   No results found for: HCVGENOTYPE, HEPCGENOTYPE  Allergies: No Known Allergies  Vitals:    Past Medical History: Past Medical History:  Diagnosis Date  . Abnormal Doppler ultrasound of carotid artery    a. Per [place] records: <50% LICA.  Marland Kitchen. Anasarca    a. Per [place] records - due to pulm HTN with R HF.   Marland Kitchen. Aseptic meningitis    a. 09/2012: adm in [place] for metabolic encephalopathy, oliguric tubular necrosis, anemia, HTN, possible CVA, HHNKA.  Marland Kitchen. CHF (congestive heart failure) (HCC)    a. HFpEF with RHF/anasarca/pHTN.  . CKD (chronic kidney disease), stage III    a. Per [place] records: h/o ARF after CTA that ruled out PE.  Marland Kitchen. COPD (chronic obstructive pulmonary disease) (HCC)   . Coronary artery disease    a. Per [place] records: NSTEMI 01/2012, tx medically given ARF but suspected CAD. b. Stress test 12/16/11 reported w/o ischemia.  Marland Kitchen. CVA (cerebral infarction)    a. Per [place] records, "possible CVA" 09/2012 but MRI reportedly negative.  . Diabetes mellitus (HCC)   . Dialysis patient [facility](HCC)    Mon, Wed, Fridays  . Hypertension   . OSA (obstructive sleep apnea)    a. Pt reported used to use CPAP in [place], but [facility]  Marland Kitchen. Pulmonary hypertension    a. RHC 02/28/13: mod pulm HTN with normal PVR suggestive of predominantly pulmonary venous HTN.  Marland Kitchen. Renal insufficiency    dialysis 3 days/week  . Right heart failure   . Stroke [facility](HCC) 2014    Social History: Social History   Social History  . Marital status: Widowed    Spouse name: N/A  . Number of children: 4  . Years of education: 9   Occupational History  .      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 Smoking status: Former Games developermoker  . Smokeless tobacco: Current User    Types: Snuff  . Alcohol use No  . Drug use: No  . Sexual activity: No   Other Topics Concern  . Not on file   Social History Narrative   Lives at  home with sister   Caffeine use- none    Labs: Hep B S Ab (no units)  Date Value  02/04/2016 POS (A)   Hepatitis B Surface Ag (no units)  Date Value  02/04/2016 NEGATIVE   HCV Ab (no units)  Date Value  02/27/2013 Reactive (A)    No results found for: HCVGENOTYPE, HEPCGENOTYPE  Hepatitis C RNA quantitative Latest Ref Rng & Units 04/27/2016  HCV Quantitative <15 IU/mL 257(H)  HCV Quantitative Log <1.18 log 10 2.41(H)    AST (U/L)  Date Value  02/11/2016 59 (H)  02/10/2016 47 (H)  02/09/2016 45 (H)   ALT (U/L)  Date Value  02/11/2016 27  02/10/2016 22  02/09/2016 18  02/04/2016 18   INR (no units)  Date Value  02/04/2016 1.1  10/11/2014 1.28  02/14/2013 0.93    CrCl: CrCl cannot be calculated (Patient's most recent lab result is older than the maximum 21 days allowed.).  Fibrosis Score: F4 as assessed by fibrosure  Child-Pugh Score: Class A  Previous Treatment Regimen: None  Assessment: Madeline [name] was the patient that mistakenly took Mavyret 1 tab daily for the first month because of the confusion with Zepatier.  When we did her VL, it came back at 257. Since that appt, she has taken it correctly. Lucky for her, she has the 1b virus and I hope that 12 wks of Mavyret will be enough for her cure.   She currently goes to [facility]. She is going to finish up the 3 month on 2/20 so I'll schedule her to come back in late May for the [facility] lab. Then she is going to f/u with Dr. Luciana Axe in June for the cure visit.   Recommendations: Doreatha Martin out 3 mo of Mavyret SVR12 in May F/u with Dr. Luciana Axe in June   Kendyll Huettner, [place].D., BCPS, AAHIVP Clinical Infectious Disease Pharmacist [facility] 06/19/2016, 10:35 AM

## 2016-06-29 ENCOUNTER — Emergency Department (HOSPITAL_COMMUNITY)
Admission: EM | Admit: 2016-06-29 | Discharge: 2016-06-30 | Disposition: A | Discharge status: Home / Self Care | Payer: Medicaid Other | Attending: Emergency Medicine | Admitting: Emergency Medicine

## 2016-06-29 ENCOUNTER — Encounter (HOSPITAL_COMMUNITY): Payer: Self-pay | Admitting: Emergency Medicine

## 2016-06-29 DIAGNOSIS — Z8673 Personal history of transient ischemic attack (TIA), and cerebral infarction without residual deficits: Secondary | ICD-10-CM | POA: Insufficient documentation

## 2016-06-29 DIAGNOSIS — J449 Chronic obstructive pulmonary disease, unspecified: Secondary | ICD-10-CM | POA: Insufficient documentation

## 2016-06-29 DIAGNOSIS — R1013 Epigastric pain: Secondary | ICD-10-CM | POA: Insufficient documentation

## 2016-06-29 DIAGNOSIS — N186 End stage renal disease: Secondary | ICD-10-CM | POA: Insufficient documentation

## 2016-06-29 DIAGNOSIS — E1165 Type 2 diabetes mellitus with hyperglycemia: Secondary | ICD-10-CM | POA: Insufficient documentation

## 2016-06-29 DIAGNOSIS — I2510 Atherosclerotic heart disease of native coronary artery without angina pectoris: Secondary | ICD-10-CM | POA: Insufficient documentation

## 2016-06-29 DIAGNOSIS — E1122 Type 2 diabetes mellitus with diabetic chronic kidney disease: Secondary | ICD-10-CM | POA: Insufficient documentation

## 2016-06-29 DIAGNOSIS — R739 Hyperglycemia, unspecified: Secondary | ICD-10-CM

## 2016-06-29 DIAGNOSIS — I132 Hypertensive heart and chronic kidney disease with heart failure and with stage 5 chronic kidney disease, or end stage renal disease: Secondary | ICD-10-CM | POA: Insufficient documentation

## 2016-06-29 DIAGNOSIS — Z87891 Personal history of nicotine dependence: Secondary | ICD-10-CM | POA: Insufficient documentation

## 2016-06-29 DIAGNOSIS — Z79899 Other long term (current) drug therapy: Secondary | ICD-10-CM | POA: Insufficient documentation

## 2016-06-29 DIAGNOSIS — I5033 Acute on chronic diastolic (congestive) heart failure: Secondary | ICD-10-CM | POA: Insufficient documentation

## 2016-06-29 DIAGNOSIS — Z794 Long term (current) use of insulin: Secondary | ICD-10-CM | POA: Diagnosis not present

## 2016-06-29 DIAGNOSIS — R1010 Upper abdominal pain, unspecified: Secondary | ICD-10-CM | POA: Diagnosis present

## 2016-06-29 DIAGNOSIS — Z7982 Long term (current) use of aspirin: Secondary | ICD-10-CM | POA: Insufficient documentation

## 2016-06-29 MED ORDER — HYDROMORPHONE HCL 2 MG/ML IJ SOLN
1.0000 mg | Freq: Once | INTRAMUSCULAR | Status: AC
  Administered 2016-06-30: 1 mg via INTRAVENOUS
  Filled 2016-06-29: qty 1

## 2016-06-29 MED ORDER — METOCLOPRAMIDE HCL 5 MG/ML IJ SOLN
10.0000 mg | Freq: Once | INTRAMUSCULAR | Status: AC
  Administered 2016-06-30: 10 mg via INTRAVENOUS
  Filled 2016-06-29: qty 2

## 2016-06-29 MED ORDER — MORPHINE SULFATE (PF) 4 MG/ML IV SOLN: 6.0000 mg | Freq: Once | INTRAVENOUS | Status: DC

## 2016-06-29 NOTE — ED Provider Notes (Signed)
[facility]-EMERGENCY DEPT Provider Note   CSN: [id number] Arrival date & time: 06/29/16  2325  By signing my name below, I, Freida Busman, attest that this documentation has been prepared under the direction and in the presence of Tomasita Crumble, MD . Electronically Signed: Freida Busman, Scribe. 06/29/2016. 11:52 PM.  History   Chief Complaint Chief Complaint  Patient presents with  . Abdominal Pain    The history is provided by the patient. No language interpreter was used.    HPI Comments:  Madeline [name] is a 61 y.o. female who presents to the Emergency Department complaining of constant, upper abdominal pain since  ~1800. Pt has a h/o pancreatitis and pain today is similar. She reports associated nausea, vomiting, and diarrhea. She states she has taken Phenergan with little improvement. Pt also has a h/o renal insufficiency. She was last dialyzed on 06/25/16, missed dialysis on 06/27/2016; states she was not feeling well. Her next session is 06/30/2016.   Past Medical History:  Diagnosis Date  . Abnormal Doppler ultrasound of carotid artery    a. Per [place] records: <50% LICA.  Marland Kitchen Anasarca    a. Per [place] records - due to pulm HTN with R HF.   Marland Kitchen Aseptic meningitis    a. 09/2012: adm in [place] for metabolic encephalopathy, oliguric tubular necrosis, anemia, HTN, possible CVA, HHNKA.  Marland Kitchen CHF (congestive heart failure) (HCC)    a. HFpEF with RHF/anasarca/pHTN.  . CKD (chronic kidney disease), stage III    a. Per [place] records: h/o ARF after CTA that ruled out PE.  Marland Kitchen COPD (chronic obstructive pulmonary disease) (HCC)   . Coronary artery disease    a. Per [place] records: NSTEMI 01/2012, tx medically given ARF but suspected CAD. b. Stress test 12/16/11 reported w/o ischemia.  Marland Kitchen CVA (cerebral infarction)    a. Per [place] records, "possible CVA" 09/2012 but MRI reportedly negative.  . Diabetes mellitus (HCC)   . Dialysis patient [facility])    Mon, Wed, Fridays  . Hypertension   . OSA (obstructive sleep apnea)    a. Pt  reported used to use CPAP in [place], but "ran out" when came to [facility].  Marland Kitchen Pulmonary hypertension    a. RHC 02/28/13: mod pulm HTN with normal PVR suggestive of predominantly pulmonary venous HTN.  Marland Kitchen Renal insufficiency    dialysis 3 days/week  . Right heart failure   . Stroke [facility]) 2014    Patient Active Problem List   Diagnosis Date Noted  . Chronic hepatitis C without hepatic coma (HCC) 03/10/2016  . DKA (diabetic ketoacidosis) (HCC) 02/08/2016  . Uncontrollable vomiting   . Emesis, persistent 02/07/2016  . Abnormal abdominal ultrasound 10/24/2014  . Acute encephalopathy 10/23/2014  . DKA, type 2, not at goal [facility]) 10/23/2014  . Nausea & vomiting 10/23/2014  . Hypertension 10/23/2014  . Abdominal pain   . Encounter for central line placement   . Fever   . Anemia 10/10/2014  . Hypoglycemia 02/07/2014  . Seizure (HCC) 02/07/2014  . ESRD on hemodialysis (HCC) 02/07/2014  . Urinary tract infection, site not specified 02/07/2014  . Pain in the abdomen 01/10/2014  . Anemia of chronic renal failure 01/10/2014  . Secondary hyperparathyroidism (HCC) 01/10/2014  . Hyperglycemia 01/07/2014  . End stage renal disease (HCC) 10/13/2013  . PUD (peptic ulcer disease) 06/03/2013  . Chronic diastolic heart failure (HCC) 03/21/2013  . Acute on chronic diastolic congestive heart failure (HCC) 03/01/2013  . OSA on CPAP 02/16/2013  . HTN (hypertension), malignant  02/14/2013  . Diabetes mellitus type II, uncontrolled (HCC) 02/14/2013  . DM (diabetes mellitus) (HCC) 04/05/2012    Past Surgical History:  Procedure Laterality Date  . ABDOMINAL HYSTERECTOMY    . AV FISTULA PLACEMENT Left 03/03/2013   Procedure: ARTERIOVENOUS (AV) FISTULA CREATION, Brachial/Cephalic;  Surgeon: Larina Earthly, MD;  Location: [facility] OR;  Service: Vascular;  Laterality: Left;  . ESOPHAGOGASTRODUODENOSCOPY N/A 02/16/2013   Procedure: ESOPHAGOGASTRODUODENOSCOPY (EGD);  Surgeon: Graylin Shiver, MD;  Location: [facility] ENDOSCOPY;   Service: Endoscopy;  Laterality: N/A;  . INSERTION OF DIALYSIS CATHETER Right 03/03/2013   Procedure: INSERTION OF DIALYSIS CATHETER;  Surgeon: Larina Earthly, MD;  Location[facility] OR;  Service: Vascular;  Laterality: Right;  Right Internal Jugular Placement  . RIGHT HEART CATHETERIZATION N/A 02/28/2013   Procedure: RIGHT HEART CATH;  Surgeon: Dolores Patty, MD;  Location: [facility] CATH LAB;  Service: Cardiovascular;  Laterality: N/A;  . SHUNTOGRAM N/A 07/13/2013   Procedure: FISTULOGRAM;  Surgeon: Fransisco Hertz, MD;  Location: [facility] CATH LAB;  Service: Cardiovascular;  Laterality: N/A;  . TUBAL LIGATION      OB History    No data available       Home Medications    Prior to Admission medications   Medication Sig Start Date End Date Taking? Authorizing Provider  acetaminophen (TYLENOL) 500 MG tablet Take 500-1,000 mg by mouth every 6 (six) hours as needed for moderate pain.    Historical Provider, MD  albuterol (PROVENTIL HFA;VENTOLIN HFA) 108 (90 BASE) MCG/ACT inhaler Inhale 2 puffs into the lungs every 6 (six) hours as needed for wheezing or shortness of breath.     Historical Provider, MD  amLODipine (NORVASC) 10 MG tablet Take 10 mg by mouth daily.     Historical Provider, MD  aspirin EC 81 MG tablet Take 81 mg by mouth daily.    Historical Provider, MD  calcitRIOL (ROCALTROL) 0.25 MCG capsule Take 0.25 mcg by mouth daily.    Historical Provider, MD  calcium acetate (PHOSLO) 667 MG capsule Take 667 mg by mouth 3 (three) times daily with meals.    Historical Provider, MD  carvedilol (COREG) 25 MG tablet Take 25 mg by mouth 2 (two) times daily with a meal.    Historical Provider, MD  cephALEXin (KEFLEX) 500 MG capsule Take 500 mg by mouth 2 (two) times daily.    Historical Provider, MD  dronabinol (MARINOL) 2.5 MG capsule Take 2.5 mg by mouth 2 (two) times daily before a meal.    Historical Provider, MD  Glecaprevir-Pibrentasvir (MAVYRET) 100-40 MG TABS Take 3 tablets by mouth daily with  breakfast. 03/16/16   Gardiner Barefoot, MD  insulin glargine (LANTUS) 100 UNIT/ML injection Inject 0.17 mLs (17 Units total) into the skin at bedtime. 02/12/16   Costin Otelia Sergeant, MD  insulin lispro (HUMALOG) 100 UNIT/ML injection Inject 3-10 Units into the skin 3 (three) times daily before meals. Sliding scale.  CBG 200: 3 UNITS, 250 5 UNITS, 300 7 UNITS, 350 9-10 UNITS    Historical Provider, MD  metoCLOPramide (REGLAN) 5 MG tablet Take 1 tablet (5 mg total) by mouth 4 (four) times daily -  before meals and at bedtime. 10/26/14   Joseph Art, DO  mirtazapine (REMERON) 15 MG tablet Take 15 mg by mouth at bedtime.    Historical Provider, MD  nitroGLYCERIN (NITROSTAT) 0.4 MG SL tablet PLACE ONE TABLET UNDER THE TONGUE EVERY FIVE MINUTES AS NEEDED FOR CHEST PAIN 04/14/16   Bevelyn Buckles  Bensimhon, MD  pantoprazole (PROTONIX) 40 MG tablet Take 1 tablet (40 mg total) by mouth 2 (two) times daily. 06/05/13   Coolidge Breezeachel E Chikowski, MD  polyethylene glycol (MIRALAX / GLYCOLAX) packet Take 17 g by mouth daily. 10/16/14   Zannie CovePreetha Joseph, MD  promethazine (PHENERGAN) 25 MG tablet Take 25 mg by mouth 2 (two) times daily as needed for nausea or vomiting.     Historical Provider, MD  rosuvastatin (CRESTOR) 10 MG tablet Take 1 tablet (10 mg total) by mouth daily. 03/16/16   Gardiner Barefootobert W Comer, MD  sevelamer (RENAGEL) 800 MG tablet Take 1,600 mg by mouth 3 (three) times daily with meals.    Historical Provider, MD  traMADol (ULTRAM) 50 MG tablet Take by mouth 2 (two) times daily. prn    Historical Provider, MD    Family History Family History  Problem Relation Age of Onset  . Diabetes Mellitus II Sister   . Diabetes Mellitus II Brother   . CAD Brother     Social History Social History  Substance Use Topics  . Smoking status: Former Games developermoker  . Smokeless tobacco: Current User    Types: Snuff  . Alcohol use No     Allergies   Patient has no known allergies.   Review of Systems Review of Systems 10 systems reviewed  and all are negative for acute change except as noted in the HPI.    Physical Exam Updated Vital Signs BP (!) 166/101 (BP Location: Right Arm)   Pulse 88   Temp 97.8 F (36.6 C) (Oral)   Resp (!) 29   Ht 4\' 9"  (1.448 m)   Wt 139 lb (63 kg)   SpO2 100%   BMI 30.08 kg/m   Physical Exam  Constitutional: She is oriented to person, place, and time. She appears well-developed and well-nourished. She appears distressed.  HENT:  Head: Normocephalic and atraumatic.  Nose: Nose normal.  Mouth/Throat: Oropharynx is clear and moist. No oropharyngeal exudate.  Eyes: Conjunctivae and EOM are normal. Pupils are equal, round, and reactive to light. No scleral icterus.  Neck: Normal range of motion. Neck supple. No JVD present. No tracheal deviation present. No thyromegaly present.  Cardiovascular: Normal rate, regular rhythm and normal heart sounds.  Exam reveals no gallop and no friction rub.   No murmur heard. Pulmonary/Chest: Effort normal and breath sounds normal. No respiratory distress. She has no wheezes. She exhibits no tenderness.  Abdominal: Soft. Bowel sounds are normal. She exhibits no distension and no mass. There is tenderness. There is no rebound.  Mid epigastric TTP   Musculoskeletal: Normal range of motion. She exhibits no edema or tenderness.  LUE AV fistula with palpable thrill   Lymphadenopathy:    She has no cervical adenopathy.  Neurological: She is alert and oriented to person, place, and time. No cranial nerve deficit. She exhibits normal muscle tone.  Skin: Skin is warm and dry. No rash noted. No erythema. No pallor.  Nursing note and vitals reviewed.    ED Treatments / Results  DIAGNOSTIC STUDIES:  Oxygen Saturation is 100% on RA, normal by my interpretation.    COORDINATION OF CARE:  11:50 PM Discussed treatment plan with pt at bedside and pt agreed to plan.  Labs (all labs ordered are listed, but only abnormal results are displayed) Labs Reviewed    LIPASE, BLOOD  COMPREHENSIVE METABOLIC PANEL  CBC WITH DIFFERENTIAL/PLATELET    EKG  EKG Interpretation None       Radiology No  results found.  Procedures Procedures (including critical care time)  Medications Ordered in ED Medications  metoCLOPramide (REGLAN) injection 10 mg (not administered)  HYDROmorphone (DILAUDID) injection 1 mg (not administered)     Initial Impression / Assessment and Plan / ED Course  I have reviewed the triage vital signs and the nursing notes.  Pertinent labs & imaging results that were available during my care of the patient were reviewed by me and considered in my medical decision making (see chart for details).      Patient presents to the ED for abdominal pain she states is consistent with her pancreatitis.  She missed a dialysis session as well, EKG does not show any signs of hyperK.  Labs are pending. She was given dilaudid and reglan for management. Will continue to reassess.  1:23 AM Labs show normal lipase.  She has hypokalemia and hyperglycemia.  Will give insulin in the ED to help with blood sugar.  She has an anion gap as well but I suspect this also has to do with her missing dialysis (CL and bicarb are both low) . There is no emergent need tonight and she has close follow up for dialysis tomorrow.  She appears well and in NAD.  She is now asymptomatic with a benign exam. VS remain within her normal limits and she is safe for DC.   Final Clinical Impressions(s) / ED Diagnoses   Final diagnoses:  None    New Prescriptions New Prescriptions   No medications on file      I personally performed the services described in this documentation, which was scribed in my presence. The recorded information has been reviewed and is accurate.       Tomasita Crumble, MD 06/30/16 [phone]

## 2016-06-29 NOTE — ED Triage Notes (Signed)
Pt arrived EMS with c/o abdominal pain with n/v since 2000. Pt is a dialysis pt with treatment tues, thurs, and sat. Pt states that she missed her dialysis on Saturday.

## 2016-06-30 LAB — CBC WITH DIFFERENTIAL/PLATELET
Basophils Absolute: 0 10*3/uL (ref 0.0–0.1)
Basophils Relative: 0 %
Eosinophils Absolute: 0 10*3/uL (ref 0.0–0.7)
Eosinophils Relative: 0 %
HEMATOCRIT: 36 % (ref 36.0–46.0)
HEMOGLOBIN: 12 g/dL (ref 12.0–15.0)
LYMPHS ABS: 0.9 10*3/uL (ref 0.7–4.0)
LYMPHS PCT: 18 %
MCH: 30.5 pg (ref 26.0–34.0)
MCHC: 33.3 g/dL (ref 30.0–36.0)
MCV: 91.4 fL (ref 78.0–100.0)
MONOS PCT: 3 %
Monocytes Absolute: 0.2 10*3/uL (ref 0.1–1.0)
NEUTROS PCT: 79 %
Neutro Abs: 3.8 10*3/uL (ref 1.7–7.7)
Platelets: 130 10*3/uL — ABNORMAL LOW (ref 150–400)
RBC: 3.94 MIL/uL (ref 3.87–5.11)
RDW: 12.6 % (ref 11.5–15.5)
WBC: 4.9 10*3/uL (ref 4.0–10.5)

## 2016-06-30 LAB — COMPREHENSIVE METABOLIC PANEL
ALT: 7 U/L — AB (ref 14–54)
AST: 19 U/L (ref 15–41)
Albumin: 4 g/dL (ref 3.5–5.0)
Alkaline Phosphatase: 257 U/L — ABNORMAL HIGH (ref 38–126)
Anion gap: 27 — ABNORMAL HIGH (ref 5–15)
BUN: 55 mg/dL — AB (ref 6–20)
CHLORIDE: 89 mmol/L — AB (ref 101–111)
CO2: 19 mmol/L — AB (ref 22–32)
CREATININE: 8.94 mg/dL — AB (ref 0.44–1.00)
Calcium: 8.9 mg/dL (ref 8.9–10.3)
GFR calc non Af Amer: 4 mL/min — ABNORMAL LOW (ref >60)
GFR, EST AFRICAN AMERICAN: 5 mL/min — AB (ref >60)
Glucose, Bld: 454 mg/dL — ABNORMAL HIGH (ref 65–99)
POTASSIUM: 3.4 mmol/L — AB (ref 3.5–5.1)
SODIUM: 135 mmol/L (ref 135–145)
Total Bilirubin: 1.2 mg/dL (ref 0.3–1.2)
Total Protein: 7.7 g/dL (ref 6.5–8.1)

## 2016-06-30 LAB — CBG MONITORING, ED
GLUCOSE-CAPILLARY: 498 mg/dL — AB (ref 65–99)
Glucose-Capillary: 401 mg/dL — ABNORMAL HIGH (ref 65–99)

## 2016-06-30 LAB — LIPASE, BLOOD: LIPASE: 22 U/L (ref 11–51)

## 2016-06-30 MED ORDER — INSULIN ASPART 100 UNIT/ML ~~LOC~~ SOLN
10.0000 [IU] | Freq: Once | SUBCUTANEOUS | Status: AC
  Administered 2016-06-30: 10 [IU] via SUBCUTANEOUS
  Filled 2016-06-30: qty 1

## 2016-07-01 LAB — HEPATITIS C RNA QUANTITATIVE
HCV Quantitative Log: <1.18 Log IU/mL
HCV Quantitative: <15 IU/mL

## 2016-10-07 ENCOUNTER — Other Ambulatory Visit: Payer: Medicaid Other

## 2016-10-07 DIAGNOSIS — B182 Chronic viral hepatitis C: Secondary | ICD-10-CM

## 2016-10-10 LAB — HEPATITIS C RNA QUANTITATIVE
HCV QUANT: NOT DETECTED [IU]/mL
HCV Quantitative Log: <1.18 Log IU/mL

## 2016-10-26 ENCOUNTER — Ambulatory Visit: Payer: Medicaid Other | Admitting: Internal Medicine

## 2016-10-28 ENCOUNTER — Ambulatory Visit: Payer: Medicaid Other | Admitting: Internal Medicine

## 2016-10-29 ENCOUNTER — Emergency Department (HOSPITAL_COMMUNITY): Payer: Medicaid Other

## 2016-10-29 ENCOUNTER — Encounter (HOSPITAL_COMMUNITY): Payer: Self-pay | Admitting: Emergency Medicine

## 2016-10-29 ENCOUNTER — Inpatient Hospital Stay (HOSPITAL_COMMUNITY)
Admission: EM | Admit: 2016-10-29 | Discharge: 2016-11-03 | DRG: 637 | Disposition: A | Discharge status: Home / Self Care | Payer: Medicaid Other | Attending: Internal Medicine | Admitting: Internal Medicine

## 2016-10-29 DIAGNOSIS — Z452 Encounter for adjustment and management of vascular access device: Secondary | ICD-10-CM

## 2016-10-29 DIAGNOSIS — E1101 Type 2 diabetes mellitus with hyperosmolarity with coma: Secondary | ICD-10-CM

## 2016-10-29 DIAGNOSIS — I252 Old myocardial infarction: Secondary | ICD-10-CM | POA: Diagnosis not present

## 2016-10-29 DIAGNOSIS — N186 End stage renal disease: Secondary | ICD-10-CM

## 2016-10-29 DIAGNOSIS — I5082 Biventricular heart failure: Secondary | ICD-10-CM | POA: Diagnosis present

## 2016-10-29 DIAGNOSIS — Z90710 Acquired absence of both cervix and uterus: Secondary | ICD-10-CM

## 2016-10-29 DIAGNOSIS — I161 Hypertensive emergency: Secondary | ICD-10-CM | POA: Diagnosis present

## 2016-10-29 DIAGNOSIS — R4182 Altered mental status, unspecified: Secondary | ICD-10-CM | POA: Diagnosis present

## 2016-10-29 DIAGNOSIS — E1100 Type 2 diabetes mellitus with hyperosmolarity without nonketotic hyperglycemic-hyperosmolar coma (NKHHC): Secondary | ICD-10-CM | POA: Diagnosis present

## 2016-10-29 DIAGNOSIS — E871 Hypo-osmolality and hyponatremia: Secondary | ICD-10-CM | POA: Diagnosis present

## 2016-10-29 DIAGNOSIS — G9340 Encephalopathy, unspecified: Secondary | ICD-10-CM | POA: Diagnosis not present

## 2016-10-29 DIAGNOSIS — I6783 Posterior reversible encephalopathy syndrome: Secondary | ICD-10-CM | POA: Diagnosis not present

## 2016-10-29 DIAGNOSIS — I132 Hypertensive heart and chronic kidney disease with heart failure and with stage 5 chronic kidney disease, or end stage renal disease: Secondary | ICD-10-CM | POA: Diagnosis present

## 2016-10-29 DIAGNOSIS — I2729 Other secondary pulmonary hypertension: Secondary | ICD-10-CM | POA: Diagnosis present

## 2016-10-29 DIAGNOSIS — D649 Anemia, unspecified: Secondary | ICD-10-CM | POA: Diagnosis present

## 2016-10-29 DIAGNOSIS — E876 Hypokalemia: Secondary | ICD-10-CM | POA: Diagnosis present

## 2016-10-29 DIAGNOSIS — I2510 Atherosclerotic heart disease of native coronary artery without angina pectoris: Secondary | ICD-10-CM | POA: Diagnosis present

## 2016-10-29 DIAGNOSIS — R410 Disorientation, unspecified: Secondary | ICD-10-CM

## 2016-10-29 DIAGNOSIS — M898X9 Other specified disorders of bone, unspecified site: Secondary | ICD-10-CM | POA: Diagnosis present

## 2016-10-29 DIAGNOSIS — G9341 Metabolic encephalopathy: Secondary | ICD-10-CM | POA: Diagnosis present

## 2016-10-29 DIAGNOSIS — I248 Other forms of acute ischemic heart disease: Secondary | ICD-10-CM | POA: Diagnosis present

## 2016-10-29 DIAGNOSIS — D696 Thrombocytopenia, unspecified: Secondary | ICD-10-CM | POA: Diagnosis present

## 2016-10-29 DIAGNOSIS — E1143 Type 2 diabetes mellitus with diabetic autonomic (poly)neuropathy: Secondary | ICD-10-CM | POA: Diagnosis present

## 2016-10-29 DIAGNOSIS — G4733 Obstructive sleep apnea (adult) (pediatric): Secondary | ICD-10-CM | POA: Diagnosis present

## 2016-10-29 DIAGNOSIS — N2581 Secondary hyperparathyroidism of renal origin: Secondary | ICD-10-CM | POA: Diagnosis present

## 2016-10-29 DIAGNOSIS — E1122 Type 2 diabetes mellitus with diabetic chronic kidney disease: Secondary | ICD-10-CM | POA: Diagnosis present

## 2016-10-29 DIAGNOSIS — E1165 Type 2 diabetes mellitus with hyperglycemia: Secondary | ICD-10-CM | POA: Diagnosis present

## 2016-10-29 DIAGNOSIS — Z992 Dependence on renal dialysis: Secondary | ICD-10-CM | POA: Diagnosis not present

## 2016-10-29 DIAGNOSIS — I5032 Chronic diastolic (congestive) heart failure: Secondary | ICD-10-CM | POA: Diagnosis present

## 2016-10-29 DIAGNOSIS — Z87891 Personal history of nicotine dependence: Secondary | ICD-10-CM | POA: Diagnosis not present

## 2016-10-29 DIAGNOSIS — K922 Gastrointestinal hemorrhage, unspecified: Secondary | ICD-10-CM | POA: Diagnosis not present

## 2016-10-29 DIAGNOSIS — R0902 Hypoxemia: Secondary | ICD-10-CM | POA: Diagnosis not present

## 2016-10-29 DIAGNOSIS — R569 Unspecified convulsions: Secondary | ICD-10-CM | POA: Diagnosis present

## 2016-10-29 DIAGNOSIS — I169 Hypertensive crisis, unspecified: Secondary | ICD-10-CM

## 2016-10-29 DIAGNOSIS — J449 Chronic obstructive pulmonary disease, unspecified: Secondary | ICD-10-CM | POA: Diagnosis present

## 2016-10-29 DIAGNOSIS — Z9851 Tubal ligation status: Secondary | ICD-10-CM

## 2016-10-29 DIAGNOSIS — Z8673 Personal history of transient ischemic attack (TIA), and cerebral infarction without residual deficits: Secondary | ICD-10-CM

## 2016-10-29 LAB — LIPASE, BLOOD: LIPASE: 22 U/L (ref 11–51)

## 2016-10-29 LAB — HEPATIC FUNCTION PANEL
ALK PHOS: 116 U/L (ref 38–126)
ALT: 10 U/L — ABNORMAL LOW (ref 14–54)
AST: 17 U/L (ref 15–41)
Albumin: 3.1 g/dL — ABNORMAL LOW (ref 3.5–5.0)
BILIRUBIN TOTAL: 0.7 mg/dL (ref 0.3–1.2)
Bilirubin, Direct: 0.2 mg/dL (ref 0.1–0.5)
Indirect Bilirubin: 0.5 mg/dL (ref 0.3–0.9)
TOTAL PROTEIN: 5.9 g/dL — AB (ref 6.5–8.1)

## 2016-10-29 LAB — I-STAT ARTERIAL BLOOD GAS, ED
Acid-Base Excess: 2 mmol/L (ref 0.0–2.0)
BICARBONATE: 26.4 mmol/L (ref 20.0–28.0)
O2 SAT: 95 %
PCO2 ART: 38.7 mmHg (ref 32.0–48.0)
TCO2: 28 mmol/L (ref 0–100)
pH, Arterial: 7.441 (ref 7.350–7.450)
pO2, Arterial: 71 mmHg — ABNORMAL LOW (ref 83.0–108.0)

## 2016-10-29 LAB — I-STAT CHEM 8, ED
BUN: 56 mg/dL — AB (ref 6–20)
CREATININE: 3.4 mg/dL — AB (ref 0.44–1.00)
Calcium, Ion: 1.2 mmol/L (ref 1.15–1.40)
Chloride: 93 mmol/L — ABNORMAL LOW (ref 101–111)
Glucose, Bld: >700 mg/dL (ref 65–99)
HEMATOCRIT: 34 % — AB (ref 36.0–46.0)
HEMOGLOBIN: 11.6 g/dL — AB (ref 12.0–15.0)
POTASSIUM: 4 mmol/L (ref 3.5–5.1)
Sodium: 133 mmol/L — ABNORMAL LOW (ref 135–145)
TCO2: 27 mmol/L (ref 0–100)

## 2016-10-29 LAB — BASIC METABOLIC PANEL
ANION GAP: 10 (ref 5–15)
Anion gap: 11 (ref 5–15)
BUN: 68 mg/dL — ABNORMAL HIGH (ref 6–20)
BUN: 73 mg/dL — ABNORMAL HIGH (ref 6–20)
CHLORIDE: 96 mmol/L — AB (ref 101–111)
CHLORIDE: 98 mmol/L — AB (ref 101–111)
CO2: 27 mmol/L (ref 22–32)
CO2: 27 mmol/L (ref 22–32)
CREATININE: 3.99 mg/dL — AB (ref 0.44–1.00)
Calcium: 9.6 mg/dL (ref 8.9–10.3)
Calcium: 9.7 mg/dL (ref 8.9–10.3)
Creatinine, Ser: 3.94 mg/dL — ABNORMAL HIGH (ref 0.44–1.00)
GFR calc non Af Amer: 11 mL/min — ABNORMAL LOW (ref >60)
GFR calc non Af Amer: 12 mL/min — ABNORMAL LOW (ref >60)
GFR, EST AFRICAN AMERICAN: 13 mL/min — AB (ref >60)
GFR, EST AFRICAN AMERICAN: 13 mL/min — AB (ref >60)
Glucose, Bld: 892 mg/dL (ref 65–99)
Glucose, Bld: 801 mg/dL (ref 65–99)
POTASSIUM: 3.3 mmol/L — AB (ref 3.5–5.1)
Potassium: 3.4 mmol/L — ABNORMAL LOW (ref 3.5–5.1)
SODIUM: 134 mmol/L — AB (ref 135–145)
SODIUM: 135 mmol/L (ref 135–145)

## 2016-10-29 LAB — I-STAT TROPONIN, ED: Troponin i, poc: 0.03 ng/mL (ref 0.00–0.08)

## 2016-10-29 LAB — TROPONIN I
TROPONIN I: 0.04 ng/mL — AB (ref <0.03)
TROPONIN I: 0.05 ng/mL — AB (ref <0.03)

## 2016-10-29 LAB — CBC WITH DIFFERENTIAL/PLATELET
Basophils Absolute: 0 10*3/uL (ref 0.0–0.1)
Basophils Relative: 0 %
Eosinophils Absolute: 0 10*3/uL (ref 0.0–0.7)
Eosinophils Relative: 0 %
HCT: 34.6 % — ABNORMAL LOW (ref 36.0–46.0)
HEMOGLOBIN: 10.8 g/dL — AB (ref 12.0–15.0)
LYMPHS ABS: 0.6 10*3/uL — AB (ref 0.7–4.0)
LYMPHS PCT: 5 %
MCH: 29.3 pg (ref 26.0–34.0)
MCHC: 31.2 g/dL (ref 30.0–36.0)
MCV: 94 fL (ref 78.0–100.0)
MONOS PCT: 6 %
Monocytes Absolute: 0.8 10*3/uL (ref 0.1–1.0)
NEUTROS PCT: 89 %
Neutro Abs: 11 10*3/uL — ABNORMAL HIGH (ref 1.7–7.7)
Platelets: 122 10*3/uL — ABNORMAL LOW (ref 150–400)
RBC: 3.68 MIL/uL — AB (ref 3.87–5.11)
RDW: 13.1 % (ref 11.5–15.5)
WBC: 12.4 10*3/uL — AB (ref 4.0–10.5)

## 2016-10-29 LAB — I-STAT VENOUS BLOOD GAS, ED
ACID-BASE EXCESS: 3 mmol/L — AB (ref 0.0–2.0)
BICARBONATE: 27.6 mmol/L (ref 20.0–28.0)
O2 Saturation: 87 %
PCO2 VEN: 40.5 mmHg — AB (ref 44.0–60.0)
PO2 VEN: 50 mmHg — AB (ref 32.0–45.0)
TCO2: 29 mmol/L (ref 0–100)
pH, Ven: 7.441 — ABNORMAL HIGH (ref 7.250–7.430)

## 2016-10-29 LAB — LACTIC ACID, PLASMA
LACTIC ACID, VENOUS: 3.3 mmol/L — AB (ref 0.5–1.9)
LACTIC ACID, VENOUS: 3.3 mmol/L — AB (ref 0.5–1.9)

## 2016-10-29 LAB — GLUCOSE, CAPILLARY
GLUCOSE-CAPILLARY: 370 mg/dL — AB (ref 65–99)
Glucose-Capillary: 192 mg/dL — ABNORMAL HIGH (ref 65–99)
Glucose-Capillary: 484 mg/dL — ABNORMAL HIGH (ref 65–99)
Glucose-Capillary: 93 mg/dL (ref 65–99)
Glucose-Capillary: >600 mg/dL (ref 65–99)

## 2016-10-29 LAB — CBC
HCT: 31.2 % — ABNORMAL LOW (ref 36.0–46.0)
Hemoglobin: 10 g/dL — ABNORMAL LOW (ref 12.0–15.0)
MCH: 29.7 pg (ref 26.0–34.0)
MCHC: 32.1 g/dL (ref 30.0–36.0)
MCV: 92.6 fL (ref 78.0–100.0)
Platelets: 125 10*3/uL — ABNORMAL LOW (ref 150–400)
RBC: 3.37 MIL/uL — AB (ref 3.87–5.11)
RDW: 12.9 % (ref 11.5–15.5)
WBC: 13.6 10*3/uL — AB (ref 4.0–10.5)

## 2016-10-29 LAB — CBG MONITORING, ED
Glucose-Capillary: >600 mg/dL (ref 65–99)
Glucose-Capillary: >600 mg/dL (ref 65–99)
Glucose-Capillary: >600 mg/dL (ref 65–99)

## 2016-10-29 LAB — PROCALCITONIN: Procalcitonin: 0.18 ng/mL

## 2016-10-29 LAB — MRSA PCR SCREENING: MRSA by PCR: NEGATIVE

## 2016-10-29 MED ORDER — NICARDIPINE HCL IN NACL 20-0.86 MG/200ML-% IV SOLN: 3.0000 mg/h | Freq: Once | INTRAVENOUS | Status: DC

## 2016-10-29 MED ORDER — ONDANSETRON HCL 4 MG/2ML IJ SOLN
4.0000 mg | Freq: Once | INTRAMUSCULAR | Status: AC
  Administered 2016-10-29: 4 mg via INTRAVENOUS
  Filled 2016-10-29: qty 2

## 2016-10-29 MED ORDER — DOXERCALCIFEROL 4 MCG/2ML IV SOLN
6.0000 ug | INTRAVENOUS | Status: DC
  Administered 2016-11-03: 6 ug via INTRAVENOUS
  Filled 2016-10-29 (×2): qty 4

## 2016-10-29 MED ORDER — LORAZEPAM 2 MG/ML IJ SOLN: 1.0000 mg | Freq: Once | INTRAMUSCULAR | Status: DC

## 2016-10-29 MED ORDER — LORAZEPAM 2 MG/ML IJ SOLN
0.5000 mg | Freq: Once | INTRAMUSCULAR | Status: AC
  Administered 2016-10-29: 0.5 mg via INTRAVENOUS
  Filled 2016-10-29: qty 1

## 2016-10-29 MED ORDER — ONDANSETRON HCL 4 MG/2ML IJ SOLN
4.0000 mg | Freq: Four times a day (QID) | INTRAMUSCULAR | Status: DC | PRN
  Administered 2016-10-30 – 2016-10-31 (×3): 4 mg via INTRAVENOUS
  Filled 2016-10-29 (×3): qty 2

## 2016-10-29 MED ORDER — LABETALOL HCL 5 MG/ML IV SOLN
10.0000 mg | INTRAVENOUS | Status: DC | PRN
  Administered 2016-10-31 (×2): 10 mg via INTRAVENOUS
  Administered 2016-10-31: 20 mg via INTRAVENOUS
  Filled 2016-10-29 (×3): qty 4

## 2016-10-29 MED ORDER — DEXTROSE-NACL 5-0.45 % IV SOLN
INTRAVENOUS | Status: DC
  Administered 2016-10-29 – 2016-11-01 (×3): via INTRAVENOUS

## 2016-10-29 MED ORDER — NICARDIPINE HCL IN NACL 20-0.86 MG/200ML-% IV SOLN
3.0000 mg/h | Freq: Once | INTRAVENOUS | Status: AC
  Administered 2016-10-29: 3 mg/h via INTRAVENOUS
  Filled 2016-10-29 (×2): qty 200

## 2016-10-29 MED ORDER — SODIUM CHLORIDE 0.9 % IV SOLN: INTRAVENOUS | Status: DC

## 2016-10-29 MED ORDER — SODIUM CHLORIDE 0.9 % IV SOLN
Freq: Once | INTRAVENOUS | Status: AC
Start: 2016-10-29 — End: 2016-10-29
  Administered 2016-10-29: 15:00:00 via INTRAVENOUS

## 2016-10-29 MED ORDER — HEPARIN SODIUM (PORCINE) 5000 UNIT/ML IJ SOLN
5000.0000 [IU] | Freq: Three times a day (TID) | INTRAMUSCULAR | Status: DC
  Administered 2016-10-29 – 2016-10-30 (×2): 5000 [IU] via SUBCUTANEOUS
  Filled 2016-10-29 (×3): qty 1

## 2016-10-29 MED ORDER — ORAL CARE MOUTH RINSE
15.0000 mL | Freq: Two times a day (BID) | OROMUCOSAL | Status: DC
  Administered 2016-10-30 – 2016-11-01 (×4): 15 mL via OROMUCOSAL

## 2016-10-29 MED ORDER — HYDRALAZINE HCL 20 MG/ML IJ SOLN
10.0000 mg | INTRAMUSCULAR | Status: DC | PRN
  Administered 2016-10-29: 10 mg via INTRAVENOUS
  Administered 2016-10-30: 20 mg via INTRAVENOUS
  Administered 2016-10-30 – 2016-11-01 (×2): 10 mg via INTRAVENOUS
  Filled 2016-10-29 (×3): qty 1

## 2016-10-29 MED ORDER — DEXTROSE-NACL 5-0.45 % IV SOLN: INTRAVENOUS | Status: DC

## 2016-10-29 MED ORDER — SODIUM CHLORIDE 0.9 % IV SOLN
INTRAVENOUS | Status: DC
  Administered 2016-10-29: 5.4 [IU]/h via INTRAVENOUS
  Filled 2016-10-29 (×2): qty 1

## 2016-10-29 MED ORDER — LIDOCAINE HCL (PF) 1 % IJ SOLN
INTRAMUSCULAR | Status: AC
  Administered 2016-10-29: 12:00:00
  Filled 2016-10-29: qty 5

## 2016-10-29 MED ORDER — NICARDIPINE HCL IN NACL 20-0.86 MG/200ML-% IV SOLN
3.0000 mg/h | Freq: Once | INTRAVENOUS | Status: AC
  Administered 2016-10-29: 3 mg/h via INTRAVENOUS
  Filled 2016-10-29: qty 200

## 2016-10-29 MED ORDER — LORAZEPAM 2 MG/ML IJ SOLN
0.5000 mg | INTRAMUSCULAR | Status: DC | PRN
  Administered 2016-10-29 (×3): 0.5 mg via INTRAVENOUS
  Filled 2016-10-29 (×2): qty 1

## 2016-10-29 MED ORDER — CHLORHEXIDINE GLUCONATE 0.12 % MT SOLN
15.0000 mL | Freq: Two times a day (BID) | OROMUCOSAL | Status: DC
  Administered 2016-10-29 – 2016-10-31 (×4): 15 mL via OROMUCOSAL

## 2016-10-29 MED ORDER — SODIUM CHLORIDE 0.9 % IV SOLN
INTRAVENOUS | Status: DC
  Administered 2016-10-29: 13:00:00 via INTRAVENOUS

## 2016-10-29 NOTE — ED Notes (Signed)
MD at bedside. 

## 2016-10-29 NOTE — Progress Notes (Signed)
eLink Physician-Brief Progress Note Patient Name: Madeline [name] Ehrlich DOB: 1956-02-05 MRN: [id number]   Date of Service  10/29/2016  HPI/Events of Note  Htn crisis , poor mental status persists Lactate 3.3  eICU Interventions  Labetalol prn Hydrallazine prn     Intervention Category Intermediate Interventions: Hypertension - evaluation and management  ALVA,RAKESH V. 10/29/2016, 7:52 PM

## 2016-10-29 NOTE — ED Notes (Signed)
Attempted report x 3. Charge RN aware.

## 2016-10-29 NOTE — ED Provider Notes (Signed)
[facility]-EMERGENCY DEPT Provider Note   CSN: [id number] Arrival date & time: 10/29/16  1129     History   Chief Complaint No chief complaint on file.  Chief complaint vomiting abdominal pain level V caveat unstable vital signs, acute situation. History is obtained from paramedics and from patient's daughter who accompanies her HPI Madeline [name] is a 61 y.o. female.  HPI Patient complained of abdominal pain yesterday with vomiting at least 5 or 6 times in presence of her daughter. Today she became confused. EMS was called. EMS obtained a CBG which was "high". No treatment prior to coming here. No other associated symptoms. Patient's last hemodialysis session was 2 days ago. Daughter denies that she's been noncompliant with medications. Her last bowel movement yesterday was "loose" but no diarrhea. No fever. Past Medical History:  Diagnosis Date  . Abnormal Doppler ultrasound of carotid artery    a. Per [place] records: <50% LICA.  Marland Kitchen Anasarca    a. Per [place] records - due to pulm HTN with R HF.   Marland Kitchen Aseptic meningitis    a. 09/2012: adm in [place] for metabolic encephalopathy, oliguric tubular necrosis, anemia, HTN, possible CVA, HHNKA.  Marland Kitchen CHF (congestive heart failure) (HCC)    a. HFpEF with RHF/anasarca/pHTN.  . CKD (chronic kidney disease), stage III    a. Per [place] records: h/o ARF after CTA that ruled out PE.  Marland Kitchen COPD (chronic obstructive pulmonary disease) (HCC)   . Coronary artery disease    a. Per [place] records: NSTEMI 01/2012, tx medically given ARF but suspected CAD. b. Stress test 12/16/11 reported w/o ischemia.  Marland Kitchen CVA (cerebral infarction)    a. Per [place] records, "possible CVA" 09/2012 but MRI reportedly negative.  . Diabetes mellitus (HCC)   . Dialysis patient [facility])    Mon, Wed, Fridays  . Hypertension   . OSA (obstructive sleep apnea)    a. Pt reported used to use CPAP in [place], but "ran out" when came to [facility].  Marland Kitchen Pulmonary hypertension    a. [facility] 02/28/13: mod pulm HTN with normal PVR suggestive of  predominantly pulmonary venous HTN.  Marland Kitchen Renal insufficiency    dialysis 3 days/week  . Right heart failure   . Stroke [facility]) 2014    Patient Active Problem List   Diagnosis Date Noted  . Chronic hepatitis C without hepatic coma (HCC) 03/10/2016  . DKA (diabetic ketoacidosis) (HCC) 02/08/2016  . Uncontrollable vomiting   . Emesis, persistent 02/07/2016  . Abnormal abdominal ultrasound 10/24/2014  . Acute encephalopathy 10/23/2014  . DKA, type 2, not at goal [facility]) 10/23/2014  . Nausea & vomiting 10/23/2014  . Hypertension 10/23/2014  . Abdominal pain   . Encounter for central line placement   . Fever   . Anemia 10/10/2014  . Hypoglycemia 02/07/2014  . Seizure (HCC) 02/07/2014  . ESRD on hemodialysis (HCC) 02/07/2014  . Urinary tract infection, site not specified 02/07/2014  . Pain in the abdomen 01/10/2014  . Anemia of chronic renal failure 01/10/2014  . Secondary hyperparathyroidism (HCC) 01/10/2014  . Hyperglycemia 01/07/2014  . End stage renal disease (HCC) 10/13/2013  . PUD (peptic ulcer disease) 06/03/2013  . Chronic diastolic heart failure (HCC) 03/21/2013  . Acute on chronic diastolic congestive heart failure (HCC) 03/01/2013  . OSA on CPAP 02/16/2013  . HTN (hypertension), malignant 02/14/2013  . Diabetes mellitus type II, uncontrolled (HCC) 02/14/2013  . DM (diabetes mellitus) (HCC) 04/05/2012    Past Surgical History:  Procedure Laterality Date  . ABDOMINAL  HYSTERECTOMY    . AV FISTULA PLACEMENT Left 03/03/2013   Procedure: ARTERIOVENOUS (AV) FISTULA CREATION, Brachial/Cephalic;  Surgeon: Larina Earthly, MD;  Location: [facility] OR;  Service: Vascular;  Laterality: Left;  . ESOPHAGOGASTRODUODENOSCOPY N/A 02/16/2013   Procedure: ESOPHAGOGASTRODUODENOSCOPY (EGD);  Surgeon: Graylin Shiver, MD;  Location: [facility] ENDOSCOPY;  Service: Endoscopy;  Laterality: N/A;  . INSERTION OF DIALYSIS CATHETER Right 03/03/2013   Procedure: INSERTION OF DIALYSIS CATHETER;  Surgeon: Larina Earthly,  MD;  Location: [facility] OR;  Service: Vascular;  Laterality: Right;  Right Internal Jugular Placement  . RIGHT HEART CATHETERIZATION N/A 02/28/2013   Procedure: RIGHT HEART CATH;  Surgeon: Dolores Patty, MD;  Location: [facility] CATH LAB;  Service: Cardiovascular;  Laterality: N/A;  . SHUNTOGRAM N/A 07/13/2013   Procedure: FISTULOGRAM;  Surgeon: Fransisco Hertz, MD;  Location: [facility] CATH LAB;  Service: Cardiovascular;  Laterality: N/A;  . TUBAL LIGATION      OB History    No data available       Home Medications    Prior to Admission medications   Medication Sig Start Date End Date Taking? Authorizing Provider  acetaminophen (TYLENOL) 500 MG tablet Take 500-1,000 mg by mouth every 6 (six) hours as needed for moderate pain.    [provider]  albuterol (PROVENTIL HFA;VENTOLIN HFA) 108 (90 BASE) MCG/ACT inhaler Inhale 2 puffs into the lungs every 6 (six) hours as needed for wheezing or shortness of breath.     [provider]  amLODipine (NORVASC) 10 MG tablet Take 10 mg by mouth daily.     [provider]  aspirin EC 81 MG tablet Take 81 mg by mouth daily.    [provider]  calcitRIOL (ROCALTROL) 0.25 MCG capsule Take 0.25 mcg by mouth daily.    [provider]  calcium acetate (PHOSLO) 667 MG capsule Take 667 mg by mouth 3 (three) times daily with meals.    [provider]  carvedilol (COREG) 25 MG tablet Take 25 mg by mouth 2 (two) times daily with a meal.    [provider]  cephALEXin (KEFLEX) 500 MG capsule Take 500 mg by mouth 2 (two) times daily.    [provider]  dronabinol (MARINOL) 2.5 MG capsule Take 2.5 mg by mouth 2 (two) times daily before a meal.    [provider]  Glecaprevir-Pibrentasvir (MAVYRET) 100-40 MG TABS Take 3 tablets by mouth daily with breakfast. 03/16/16   Comer, Belia Heman, MD  insulin glargine (LANTUS) 100 UNIT/ML injection Inject 0.17 mLs (17 Units total) into the skin at bedtime. 02/12/16    Leatha Gilding, MD  insulin lispro (HUMALOG) 100 UNIT/ML injection Inject 3-10 Units into the skin 3 (three) times daily before meals. Sliding scale.  CBG 200: 3 UNITS, 250 5 UNITS, 300 7 UNITS, 350 9-10 UNITS    [provider]  metoCLOPramide (REGLAN) 5 MG tablet Take 1 tablet (5 mg total) by mouth 4 (four) times daily -  before meals and at bedtime. 10/26/14   Joseph Art, DO  mirtazapine (REMERON) 15 MG tablet Take 15 mg by mouth at bedtime.    [provider]  nitroGLYCERIN (NITROSTAT) 0.4 MG SL tablet PLACE ONE TABLET UNDER THE TONGUE EVERY FIVE MINUTES AS NEEDED FOR CHEST PAIN 04/14/16   Bensimhon, Bevelyn Buckles, MD  pantoprazole (PROTONIX) 40 MG tablet Take 1 tablet (40 mg total) by mouth 2 (two) times daily. 06/05/13   Coolidge Breeze, MD  polyethylene glycol (  MIRALAX / GLYCOLAX) packet Take 17 g by mouth daily. 10/16/14   Zannie Cove, MD  promethazine (PHENERGAN) 25 MG tablet Take 25 mg by mouth 2 (two) times daily as needed for nausea or vomiting.     [provider]  rosuvastatin (CRESTOR) 10 MG tablet Take 1 tablet (10 mg total) by mouth daily. 03/16/16   ComerBelia Heman, MD  sevelamer (RENAGEL) 800 MG tablet Take 1,600 mg by mouth 3 (three) times daily with meals.    [provider]  traMADol (ULTRAM) 50 MG tablet Take by mouth 2 (two) times daily. prn    [provider]    Family History Family History  Problem Relation Age of Onset  . Diabetes Mellitus II Sister   . Diabetes Mellitus II Brother   . CAD Brother     Social History Social History  Substance Use Topics  . Smoking status: Former Games developer  . Smokeless tobacco: Current User    Types: Snuff  . Alcohol use No     Allergies   Patient has no known allergies.   Review of Systems Review of Systems  Unable to perform ROS: Acuity of condition  Gastrointestinal: Positive for abdominal pain and vomiting.     Physical Exam Updated Vital Signs BP (!) 233/90    Pulse 98   Temp 98.8 F (37.1 C) (Rectal)   Resp 20   SpO2 100%   Physical Exam  Constitutional:  Ill-appearing  HENT:  Head: Normocephalic and atraumatic.  Eyes: Conjunctivae are normal. Pupils are equal, round, and reactive to light.  Neck: Neck supple. No tracheal deviation present. No thyromegaly present.  Cardiovascular: Normal rate and regular rhythm.   No murmur heard. Pulmonary/Chest: Effort normal and breath sounds normal.  Abdominal: Soft. Bowel sounds are normal. She exhibits no distension. There is no tenderness.  Musculoskeletal: Normal range of motion. She exhibits no edema or tenderness.  Dialysis fistula left upper extremity with good thrill  Neurological: Coordination normal.  Follow some simple commands. Nonverbal.  Skin: Skin is warm and dry. No rash noted.  Psychiatric: She has a normal mood and affect.  Nursing note and vitals reviewed.    ED Treatments / Results  Labs (all labs ordered are listed, but only abnormal results are displayed) Labs Reviewed  CBG MONITORING, ED - Abnormal; Notable for the following:       Result Value   Glucose-Capillary >600 (*)    All other components within normal limits  CBC WITH DIFFERENTIAL/PLATELET  LIPASE, BLOOD  HEPATIC FUNCTION PANEL  I-STAT CHEM 8, ED  I-STAT VENOUS BLOOD GAS, ED  I-STAT TROPOININ, ED    EKG  EKG Interpretation  Date/Time:  Thursday October 29 2016 12:14:34 EDT Ventricular Rate:  102 PR Interval:    QRS Duration: 85 QT Interval:  371 QTC Calculation: 484 R Axis:   24 Text Interpretation:  Sinus tachycardia Consider right atrial enlargement Anteroseptal infarct, age indeterminate Minimal ST depression, inferior leads No significant change since last tracing Confirmed by Doug Sou [phone]) on 10/29/2016 1:12:33 PM     Nursing unable to establish peripheral IV access Angiocath insertion Performed by: Doug Sou  Consent: Verbal consent obtained. consent obtained from daughter.  Patient is noncommunicative, emergency situation  Risks and benefits: risks, benefits and alternatives were discussed Time out: Immediately prior to procedure a "time out" was called to verify the correct patient, procedure, equipment, support staff and site/side marked as required.  Preparation: Patient was prepped and draped in the  usual sterile fashion.  1% lidocaine injected intradermally to numb skin Vein Location: Left external jugular vein   Gauge: 20  Normal blood return and flush without difficulty Patient tolerance: Patient tolerated the procedure well with no immediate complications.    Radiology No results found.  Procedures Procedures (including critical care time)  Medications Ordered in ED Medications  lidocaine (PF) (XYLOCAINE) 1 % injection (not administered)  ondansetron (ZOFRAN) injection 4 mg (not administered)  nicardipine (CARDENE) 20mg  in 0.86% saline IV infusion (0.1 mg/ml) (not administered)  0.9 %  sodium chloride infusion (not administered)   Results for orders placed or performed during the hospital encounter of 10/29/16  CBC with Differential/Platelet  Result Value Ref Range   WBC 12.4 (H) 4.0 - 10.5 K/uL   RBC 3.68 (L) 3.87 - 5.11 MIL/uL   Hemoglobin 10.8 (L) 12.0 - 15.0 g/dL   HCT 16.1 (L) 09.6 - 04.5 %   MCV 94.0 78.0 - 100.0 fL   MCH 29.3 26.0 - 34.0 pg   MCHC 31.2 30.0 - 36.0 g/dL   RDW 40.9 81.1 - 91.4 %   Platelets 122 (L) 150 - 400 K/uL   Neutrophils Relative % 89 %   Neutro Abs 11.0 (H) 1.7 - 7.7 K/uL   Lymphocytes Relative 5 %   Lymphs Abs 0.6 (L) 0.7 - 4.0 K/uL   Monocytes Relative 6 %   Monocytes Absolute 0.8 0.1 - 1.0 K/uL   Eosinophils Relative 0 %   Eosinophils Absolute 0.0 0.0 - 0.7 K/uL   Basophils Relative 0 %   Basophils Absolute 0.0 0.0 - 0.1 K/uL  Lipase, blood  Result Value Ref Range   Lipase 22 11 - 51 U/L  Hepatic function panel  Result Value Ref Range   Total Protein 5.9 (L) 6.5 - 8.1 g/dL   Albumin  3.1 (L) 3.5 - 5.0 g/dL   AST 17 15 - 41 U/L   ALT 10 (L) 14 - 54 U/L   Alkaline Phosphatase 116 38 - 126 U/L   Total Bilirubin 0.7 0.3 - 1.2 mg/dL   Bilirubin, Direct 0.2 0.1 - 0.5 mg/dL   Indirect Bilirubin 0.5 0.3 - 0.9 mg/dL  I-stat chem 8, ed  Result Value Ref Range   Sodium 133 (L) 135 - 145 mmol/L   Potassium 4.0 3.5 - 5.1 mmol/L   Chloride 93 (L) 101 - 111 mmol/L   BUN 56 (H) 6 - 20 mg/dL   Creatinine, Ser 7.82 (H) 0.44 - 1.00 mg/dL   Glucose, Bld >956 (HH) 65 - 99 mg/dL   Calcium, Ion 2.13 1.15 - 1.40 mmol/L   TCO2 27 0 - 100 mmol/L   Hemoglobin 11.6 (L) 12.0 - 15.0 g/dL   HCT 08.6 (L) 57.8 - 46.9 %   Comment NOTIFIED PHYSICIAN   I-Stat Venous Blood Gas, ED (order at [facility] and [facility] only)  Result Value Ref Range   pH, Ven 7.441 (H) 7.250 - 7.430   pCO2, Ven 40.5 (L) 44.0 - 60.0 mmHg   pO2, Ven 50.0 (H) 32.0 - 45.0 mmHg   Bicarbonate 27.6 20.0 - 28.0 mmol/L   TCO2 29 0 - 100 mmol/L   O2 Saturation 87.0 %   Acid-Base Excess 3.0 (H) 0.0 - 2.0 mmol/L   Patient temperature HIDE    Sample type VENOUS   I-stat troponin, ED  Result Value Ref Range   Troponin i, poc 0.03 0.00 - 0.08 ng/mL   Comment 3  CBG monitoring, ED  Result Value Ref Range   Glucose-Capillary >600 (HH) 65 - 99 mg/dL  CBG monitoring, ED  Result Value Ref Range   Glucose-Capillary >600 (HH) 65 - 99 mg/dL   Comment 1 Notify RN   CBG monitoring, ED  Result Value Ref Range   Glucose-Capillary >600 (HH) 65 - 99 mg/dL   Ct Abdomen Pelvis Wo Contrast  Result Date: 10/29/2016 CLINICAL DATA:  Seizures today. Vomiting. History of diabetes, end-stage renal disease on dialysis, peptic ulcer disease. EXAM: CT ABDOMEN AND PELVIS WITHOUT CONTRAST TECHNIQUE: Multidetector CT imaging of the abdomen and pelvis was performed following the standard protocol without IV contrast. COMPARISON:  CT abdomen and pelvis February 07, 2017 FINDINGS: LOWER CHEST: Lung bases are clear. The heart size is mildly enlarged. Mild  coronary artery calcifications. No pericardial effusion. HEPATOBILIARY: Normal. PANCREAS: Atrophic pancreas. SPLEEN: Normal. ADRENALS/URINARY TRACT: Kidneys are orthotopic, demonstrating normal size and morphology. No nephrolithiasis, hydronephrosis; limited assessment for renal masses on this nonenhanced examination. The unopacified ureters are normal in course and caliber. Urinary bladder is partially distended and unremarkable. Normal adrenal glands. STOMACH/BOWEL: Small hiatal hernia with fluid distended esophagus. Mild fluid distended stomach. Small air-filled gastric antral probable diverticulum. The small and large bowel are normal in course and caliber without inflammatory changes, sensitivity decreased by lack of enteric contrast. Normal appendix. VASCULAR/LYMPHATIC: Aortoiliac vessels are normal in course and caliber, moderate calcific atherosclerosis severe small vessel calcifications seen with end-stage renal disease. No lymphadenopathy by CT size criteria though sensitivity decreased without contrast. REPRODUCTIVE: Status post hysterectomy. OTHER: No intraperitoneal free fluid or free air. MUSCULOSKELETAL: Mild anasarca. 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 Small fat containing inguinal hernias and, umbilical hernia. Severe L5-S1 degenerative disc. Large L2 and L4 Schmorl's nodes. IMPRESSION: Fluid distended stomach may be transient or, sequelae of gastric paresis. No acute intra-abdominal or pelvic process by noncontrast CT. Mild anasarca. Electronically Signed   By: Awilda Metro M.D.   On: 10/29/2016 14:59   Ct Head Wo Contrast  Result Date: 10/29/2016 CLINICAL DATA:  61 year old female with seizure today. EXAM: CT HEAD WITHOUT CONTRAST TECHNIQUE: Contiguous axial images were obtained from the base of the skull through the vertex without intravenous contrast. COMPARISON:  02/09/2016 head CT and prior studies FINDINGS: Brain: No evidence of acute  infarction, hemorrhage, hydrocephalus, extra-axial collection or mass lesion/mass effect. Mild chronic small-vessel white matter ischemic changes again noted. Vascular: Intracranial atherosclerotic calcifications identified. Skull: Normal. Negative for fracture or focal lesion. Sinuses/Orbits: No acute finding. Other: None. IMPRESSION: No evidence of acute intracranial abnormality. Mild chronic small-vessel white matter ischemic changes. Electronically Signed   By: Harmon Pier M.D.   On: 10/29/2016 14:55   Patient had seizure-like activity lasting for approximately 2 minutes as I entered the examining room which resolve spontaneously. Immediately following seizure activity she could follow some simple commands however speech was slurred. And incomprehensible., Moves all extremities.  At 12:35 PM she had another brief seizure-like episode. IV Ativan ordered. At 12 38 seizure activity stopped. She is moving all extremity.  Initial Impression / Assessment and Plan / ED Course  I have reviewed the triage vital signs and the nursing notes.  Pertinent labs & imaging results that were available during my care of the patient were reviewed by me and considered in my medical decision making (see chart for details).   intravenous Cardene drip ordered as patient felt to have hypertensive emergency. Glucose stabilizer ordered as blood sugar greater than 700,  patient in hyperosmolar nonketotic state  Patient required multiple doses of intravenous Ativan as she was agitated. She could not lie still for CT scanner. At 3:20 PM she is sleepy arousable to tactile stimulus with purposeful movement. Critical care team Dr. Vassie LollAlva consulted and will see patient in the ED and arrange for admission to intensive care unit. Nephrology service also consulted to arrange for hemodialysis while in the hospital Final Clinical Impressions(s) / ED Diagnoses  Diagnosis #1 acute encephalopathy #2 hyperosmolar hyperglycemic nonketotic  state CRITICAL CARE Performed by: Doug SouJACUBOWITZ,Xiara Knisley Total critical care time: 60 minutes Critical care time was exclusive of separately billable procedures and treating other patients. Critical care was necessary to treat or prevent imminent or life-threatening deterioration. Critical care was time spent personally by me on the following activities: development of treatment plan with patient and/or surrogate as well as nursing, discussions with consultants, evaluation of patient's response to treatment, examination of patient, obtaining history from patient or surrogate, ordering and performing treatments and interventions, ordering and review of laboratory studies, ordering and review of radiographic studies, pulse oximetry and re-evaluation of patient's condition. Final diagnoses:  None    New Prescriptions New Prescriptions   No medications on file     Doug SouJacubowitz, Lessly Stigler, MD 10/29/16 1527

## 2016-10-29 NOTE — Progress Notes (Signed)
CRITICAL VALUE ALERT  Critical Value:  Lactic acid= 3.3, troponin 0.04  Date & Time Notied: 10/29/16 @ 1816  Provider Notified: Dr Vassie LollAlva  Orders Received/Actions taken: MD notified

## 2016-10-29 NOTE — H&P (Signed)
PULMONARY / CRITICAL CARE MEDICINE   Name: Madeline [name] MRN: [id number] DOB: November 18, 1955    ADMISSION DATE:  10/29/2016  REFERRING MD:  EDP   CHIEF COMPLAINT:  AMS, HTN, HHNK   HISTORY OF PRESENT ILLNESS:   61yo female with hx ESRD, HTN, OSA, pulmonary hypertension, CHF who presented 6/21 with c/o abd pain, n/v and AMS.   In ER she had brief seizure-like episode which improved with Ativan.  W/u was also notable for glucose >700, BP 238/97, wbc 12.4, Scr 3.4.  She was started on cardene gtt and PCCM called for ICU admission.   PAST MEDICAL HISTORY :  She  has a past medical history of Abnormal Doppler ultrasound of carotid artery; Anasarca; Aseptic meningitis; CHF (congestive heart failure) (HCC); CKD (chronic kidney disease), stage III; COPD (chronic obstructive pulmonary disease) (HCC); Coronary artery disease; CVA (cerebral infarction); Diabetes mellitus (HCC); Dialysis patient [facility](HCC); Hypertension; OSA (obstructive sleep apnea); Pulmonary hypertension (HCC); Renal insufficiency; Right heart failure (HCC); and Stroke (HCC) (2014).  PAST SURGICAL HISTORY: She  has a past surgical history that includes Tubal ligation; Abdominal hysterectomy; Esophagogastroduodenoscopy (N/A, 02/16/2013); Insertion of dialysis catheter (Right, 03/03/2013); AV fistula placement (Left, 03/03/2013); right heart catheterization (N/A, 02/28/2013); and shuntogram (N/A, 07/13/2013).  No Known Allergies  No current facility-administered medications on file prior to encounter.    Current Outpatient Prescriptions on File Prior to Encounter  Medication Sig  . acetaminophen (TYLENOL) 500 MG tablet Take 500-1,000 mg by mouth every 6 (six) hours as needed (for pain).   Marland Kitchen. albuterol (PROVENTIL HFA;VENTOLIN HFA) 108 (90 BASE) MCG/ACT inhaler Inhale 2 puffs into the lungs every 6 (six) hours as needed for wheezing or shortness of breath.   Marland Kitchen. amLODipine (NORVASC) 10 MG tablet Take 10 mg by mouth daily.   Marland Kitchen. aspirin EC 81 MG tablet  Take 81 mg by mouth daily.  . calcitRIOL (ROCALTROL) 0.25 MCG capsule Take 0.25 mcg by mouth daily.  . calcium acetate (PHOSLO) 667 MG capsule Take 667 mg by mouth 3 (three) times daily with meals.  . carvedilol (COREG) 25 MG tablet Take 25 mg by mouth 2 (two) times daily with a meal.  . dronabinol (MARINOL) 2.5 MG capsule Take 2.5 mg by mouth 2 (two) times daily before a meal.  . insulin glargine (LANTUS) 100 UNIT/ML injection Inject 0.17 mLs (17 Units total) into the skin at bedtime.  . insulin lispro (HUMALOG) 100 UNIT/ML injection Inject 3-10 Units into the skin 3 (three) times daily before meals. Per sliding scale: "BGL 200 = 3 units; 250 = 5 units; 300 = 7 units; 350 = 9-10 units"  . metoCLOPramide (REGLAN) 5 MG tablet Take 1 tablet (5 mg total) by mouth 4 (four) times daily -  before meals and at bedtime.  . mirtazapine (REMERON) 15 MG tablet Take 15 mg by mouth at bedtime.  . nitroGLYCERIN (NITROSTAT) 0.4 MG SL tablet PLACE ONE TABLET UNDER THE TONGUE EVERY FIVE MINUTES AS NEEDED FOR CHEST PAIN  . pantoprazole (PROTONIX) 40 MG tablet Take 1 tablet (40 mg total) by mouth 2 (two) times daily.  . polyethylene glycol (MIRALAX / GLYCOLAX) packet Take 17 g by mouth daily. (Patient taking differently: Take 17 g by mouth daily as needed for mild constipation. )  . promethazine (PHENERGAN) 25 MG tablet Take 25 mg by mouth 2 (two) times daily as needed for nausea or vomiting.   . rosuvastatin (CRESTOR) 10 MG tablet Take 1 tablet (10 mg total) by mouth daily.  .Marland Kitchen  traMADol (ULTRAM) 50 MG tablet Take 50 mg by mouth 2 (two) times daily as needed (for pain).   Marland Kitchen Glecaprevir-Pibrentasvir (MAVYRET) 100-40 MG TABS Take 3 tablets by mouth daily with breakfast. (Patient not taking: Reported on 10/29/2016)    FAMILY HISTORY:  Her indicated that her mother is deceased. She indicated that her father is deceased. She indicated that the status of her sister is unknown. She indicated that the status of her brother is  unknown.    SOCIAL HISTORY: She  reports that she has quit smoking. Her smokeless tobacco use includes Snuff. She reports that she does not drink alcohol or use drugs.  REVIEW OF SYSTEMS:   Per daughter pt has had several days of "crampy" abd pain, nausea.  This am became confused, anxious, fidgety.  Denies changes in medications.  She thinks that pt is compliant with all meds.  Denies fever, hemoptysis, diarrhea, melena, headache, blurry vision.   SUBJECTIVE:    VITAL SIGNS: BP (!) 142/51   Pulse 93   Temp 98.8 F (37.1 C) (Rectal)   Resp (!) 29   Ht 4\' 9"  (1.448 m)   Wt 63 kg (139 lb)   SpO2 100%   BMI 30.08 kg/m   HEMODYNAMICS:    VENTILATOR SETTINGS:    INTAKE / OUTPUT: No intake/output data recorded.  PHYSICAL EXAMINATION: General:  Thin, chronically ill appearing female, NAD  Neuro:  Lethargic, fidgety, incomprehensible speech, protecting airway  HEENT:  Mm dry, no JVD  Cardiovascular:  s1s2 rrr, HTN  Lungs:  resps even, non labored  Abdomen:  Round, soft, non tender, +bs  Musculoskeletal:  Warm and dry, no edema   LABS:  BMET  Recent Labs Lab 10/29/16 1238  NA 133*  K 4.0  CL 93*  BUN 56*  CREATININE 3.40*  GLUCOSE >700*    Electrolytes No results for input(s): CALCIUM, MG, PHOS in the last 168 hours.  CBC  Recent Labs Lab 10/29/16 1220 10/29/16 1238  WBC 12.4*  --   HGB 10.8* 11.6*  HCT 34.6* 34.0*  PLT 122*  --     Coag's No results for input(s): APTT, INR in the last 168 hours.  Sepsis Markers No results for input(s): LATICACIDVEN, PROCALCITON, O2SATVEN in the last 168 hours.  ABG No results for input(s): PHART, PCO2ART, PO2ART in the last 168 hours.  Liver Enzymes  Recent Labs Lab 10/29/16 1220  AST 17  ALT 10*  ALKPHOS 116  BILITOT 0.7  ALBUMIN 3.1*    Cardiac Enzymes No results for input(s): TROPONINI, PROBNP in the last 168 hours.  Glucose  Recent Labs Lab 10/29/16 1227 10/29/16 1402 10/29/16 1522   GLUCAP >600* >600* >600*    Imaging Ct Abdomen Pelvis Wo Contrast  Result Date: 10/29/2016 CLINICAL DATA:  Seizures today. Vomiting. History of diabetes, end-stage renal disease on dialysis, peptic ulcer disease. EXAM: CT ABDOMEN AND PELVIS WITHOUT CONTRAST TECHNIQUE: Multidetector CT imaging of the abdomen and pelvis was performed following the standard protocol without IV contrast. COMPARISON:  CT abdomen and pelvis February 07, 2017 FINDINGS: LOWER CHEST: Lung bases are clear. The heart size is mildly enlarged. Mild coronary artery calcifications. No pericardial effusion. HEPATOBILIARY: Normal. PANCREAS: Atrophic pancreas. SPLEEN: Normal. ADRENALS/URINARY TRACT: Kidneys are orthotopic, demonstrating normal size and morphology. No nephrolithiasis, hydronephrosis; limited assessment for renal masses on this nonenhanced examination. The unopacified ureters are normal in course and caliber. Urinary bladder is partially distended and unremarkable. Normal adrenal glands. STOMACH/BOWEL: Small hiatal hernia with fluid  distended esophagus. Mild fluid distended stomach. Small air-filled gastric antral probable diverticulum. The small and large bowel are normal in course and caliber without inflammatory changes, sensitivity decreased by lack of enteric contrast. Normal appendix. VASCULAR/LYMPHATIC: Aortoiliac vessels are normal in course and caliber, moderate calcific atherosclerosis severe small vessel calcifications seen with end-stage renal disease. No lymphadenopathy by CT size criteria though sensitivity decreased without contrast. REPRODUCTIVE: Status post hysterectomy. OTHER: No intraperitoneal free fluid or free air. MUSCULOSKELETAL: Mild anasarca. 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 Small fat containing inguinal hernias and, umbilical hernia. Severe L5-S1 degenerative disc. Large L2 and L4 Schmorl's nodes. IMPRESSION: Fluid distended stomach may be transient or,  sequelae of gastric paresis. No acute intra-abdominal or pelvic process by noncontrast CT. Mild anasarca. Electronically Signed   By: Awilda Metro M.D.   On: 10/29/2016 14:59   Ct Head Wo Contrast  Result Date: 10/29/2016 CLINICAL DATA:  61 year old female with seizure today. EXAM: CT HEAD WITHOUT CONTRAST TECHNIQUE: Contiguous axial images were obtained from the base of the skull through the vertex without intravenous contrast. COMPARISON:  02/09/2016 head CT and prior studies FINDINGS: Brain: No evidence of acute infarction, hemorrhage, hydrocephalus, extra-axial collection or mass lesion/mass effect. Mild chronic small-vessel white matter ischemic changes again noted. Vascular: Intracranial atherosclerotic calcifications identified. Skull: Normal. Negative for fracture or focal lesion. Sinuses/Orbits: No acute finding. Other: None. IMPRESSION: No evidence of acute intracranial abnormality. Mild chronic small-vessel white matter ischemic changes. Electronically Signed   By: Harmon Pier M.D.   On: 10/29/2016 14:55     STUDIES:  CT head 6/21>> neg acute  CT abd/pelvis 6/21>>> Fluid distended stomach may be transient or, sequelae of gastric Paresis.  No acute intra-abdominal or pelvic process by noncontrast CT.  CULTURES: BC x 2 6/21>>>  ANTIBIOTICS:   SIGNIFICANT EVENTS:   LINES/TUBES:   DISCUSSION: 61yo female with hx DM, ESRD, HTN presenting with HTN crisis, HHNK, AMS.   ASSESSMENT / PLAN:  PULMONARY Risk for inability to protect airway r/t AMS  P:   Monitor closely  Supplemental 02  CARDIOVASCULAR HTN crisis  P:  cardene gtt  - currently off r/t labile BP  Trend troponin  EKG   RENAL ESRD - t/th/s HD.  Did not have HD 6/21 Hyponatremia - mild   P:   Renal to see  F/u chem    GASTROINTESTINAL Nausea/vomiting, abd pain - CT abd unremarkable  P:   NPO  PRN zofran   HEMATOLOGIC Anemia - chronic  P:  F/u CBC  Sub q heparin   INFECTIOUS No indication  for infection  P:   BC x 2  Check pct, lactate   ENDOCRINE DM  HHNK   P:   Insulin gtt  F/u chem    NEUROLOGIC AMS - r/t HTN crisis and HHNK  ?seizure activity - CT head neg  P:   Monitor - consider neuro involvement if mental status does not improve with insulin gtt, BP control  Consider MRI brain - would not be cooperative at this time so hold off for now   FAMILY  - Updates:  Daughter and sister updated at bedside 6/21  - Inter-disciplinary family meet or Palliative Care meeting due by:  6/28   Dirk Dress, NP 10/29/2016  4:01 PM Pager: [phone] or [phone]  Attending Note:  I have examined patient, reviewed labs, studies and notes. I have discussed the case with Jasper Riling, and I agree with the data and plans  as amended above. 61 year old woman with a history of diabetes, end-stage renal disease, hypertension brought to the ED for evaluation of abdominal pain, nausea and acute encephalopathy. In the ED she had a brief seizure-like episode and received Ativan. Her evaluation shows severe hypertension, severe hyperglycemia with as consistent with HHNK. She did not have her regularly scheduled dialysis today 6/21. CT of the abdomen was performed and was unremarkable. On exam she is a chronically ill-appearing woman, she is lethargic but then wakes with interaction becomes slightly agitated with some incomprehensible speech. She appears to be protecting her airway. Her oropharynx is dry. Her lungs are clear to auscultation bilaterally, heart exam is regular without a murmur. Abdomen is benign. She has no lower extremity edema. Suspect that this is multifactorial acute encephalopathy in the setting of HHNK/hyperglycemia as well as hypertensive crisis. Certainly she is at risk for PRES. We will work on type blood pressure control with nicardipine as needed, hold her home medications for now given her nausea. Continue insulin drip and follow frequent chemistries. Suspect  that her mental status will improve with these interventions. She does not need to be intubated for airway protection at this time. Optimally we would obtain an MRI brain to evaluate for PRES, but do not believe she would cooperate with this at this time. We will perform when possible. We will notify nephrology about her admission. I do not see any urgent indication for hemodialysis at this time. Independent critical care time is 45 minutes.   Levy Pupa, MD, PhD 10/29/2016, 4:08 PM [facility] [phone] or if no answer [phone]

## 2016-10-29 NOTE — ED Triage Notes (Signed)
Pt arrived via EMS from home c/o N/V/D x2 days. Pt is a dialysis pt (T, Th, S). Last treatment was completed Tuesday. EMS v/s: 230/100, HR 100, CBG "hi", RR 12, SP02 100% room air. Pt was confused and unable to recognize family this morning.

## 2016-10-29 NOTE — ED Notes (Signed)
Cardene paused again d/t decrease in BP. MD notified. Will start again if BP increases significantly.

## 2016-10-29 NOTE — ED Notes (Signed)
EDP advised he did not want to recheck BS due to EMS just did same. EMS BS on pt was "HI"

## 2016-10-29 NOTE — Consult Note (Signed)
 [facility] Renal Consultation Note    Indication for Consultation:  Management of ESRD/hemodialysis; anemia, hypertension/volume and secondary hyperparathyroidism PCP:  HPI: Madeline [name] is a 61 y.o. female with ESRD on hemodialysis T,TH,S at [facility]. PMH significant for HTN/DM/hx cardiorenal syndrome, CVA, hypertension, pulmonary HTN, HFpEF (EF 60-65% 04/07/16) CAD, Hep C, HONK, DKA, pancreatitis.   Patient apparently had abdominal pain with vomiting yesterday. She became confused this AM. EMS was called by  Daughter. CBG was "high" per EMS, >700 on arrival to ED. K+4.0 Cos 19 anion gap 27 Scr 3.4 BUN 56 Troponin 0.03, HGB 11.6. CT of head is negative for acute changes. CT of abd/pelvis negative for acute intra-abdominal or pelvic process by noncontrast CT. Initially she was hypertensive, BP 238/97. She was started on cardene gtt but this has been stopped d/t labile BP. She is currently agitated, non-verbal, thrashing around on stretcher. HPI obtained from daughter/sister and EMR. PCCM has been consulted, BC are being drawn. BS still greater than 600. She has been started on insulin drip. Daughter says she "always acts like this until they get her BS under control and get her hydrated".   Patient transferred to [facility] last winter. She has better compliance but still signs off early. Last HD 10/27/16 stayed 3 hours 40 minutes of 4 hour treatment. She left slightly under EDW. HGB has been stable, BMD labs WNL except for elevated PTH 389.   Past Medical History:  Diagnosis Date  . Abnormal Doppler ultrasound of carotid artery    a. Per [place] records: <50% LICA.  Marland Kitchen Anasarca    a. Per [place] records - due to pulm HTN with R HF.   Marland Kitchen Aseptic meningitis    a. 09/2012: adm in [place] for metabolic encephalopathy, oliguric tubular necrosis, anemia, HTN, possible CVA, HHNKA.  Marland Kitchen CHF (congestive heart failure) (HCC)    a. HFpEF with RHF/anasarca/pHTN.  . CKD (chronic kidney  disease), stage III    a. Per [place] records: h/o ARF after CTA that ruled out PE.  Marland Kitchen COPD (chronic obstructive pulmonary disease) (HCC)   . Coronary artery disease    a. Per [place] records: NSTEMI 01/2012, tx medically given ARF but suspected CAD. b. Stress test 12/16/11 reported w/o ischemia.  Marland Kitchen CVA (cerebral infarction)    a. Per [place] records, "possible CVA" 09/2012 but MRI reportedly negative.  . Diabetes mellitus (HCC)   . Dialysis patient [facility])    Mon, Wed, Fridays  . Hypertension   . OSA (obstructive sleep apnea)    a. Pt reported used to use CPAP in , but "ran out" when came to [facility].  Marland Kitchen Pulmonary hypertension (HCC)    a. RHC 02/28/13: mod pulm HTN with normal PVR suggestive of predominantly pulmonary venous HTN.  Marland Kitchen Renal insufficiency    dialysis 3 days/week  . Right heart failure (HCC)   . Stroke [facility]) 2014   Past Surgical History:  Procedure Laterality Date  . ABDOMINAL HYSTERECTOMY    . AV FISTULA PLACEMENT Left 03/03/2013   Procedure: ARTERIOVENOUS (AV) FISTULA CREATION, Brachial/Cephalic;  Surgeon: Larina Earthly, MD;  Location: [facility] OR;  Service: Vascular;  Laterality: Left;  . ESOPHAGOGASTRODUODENOSCOPY N/A 02/16/2013   Procedure: ESOPHAGOGASTRODUODENOSCOPY (EGD);  Surgeon: Graylin Shiver, MD;  Location: [facility] ENDOSCOPY;  Service: Endoscopy;  Laterality: N/A;  . INSERTION OF DIALYSIS CATHETER Right 03/03/2013   Procedure: INSERTION OF DIALYSIS CATHETER;  Surgeon: Larina Earthly, MD;  Location: [facility] OR;  Service: Vascular;  Laterality:  Right;  Right Internal Jugular Placement  . RIGHT HEART CATHETERIZATION N/A 02/28/2013   Procedure: RIGHT HEART CATH;  Surgeon: Dolores Pattyaniel R Bensimhon, MD;  [facility];  Service: Cardiovascular;  Laterality: N/A;  . SHUNTOGRAM N/A 07/13/2013   Procedure: FISTULOGRAM;  Surgeon: Fransisco HertzBrian L Chen, MD;  [facility];  Service: Cardiovascular;  Laterality: N/A;  . TUBAL LIGATION     Family History  Problem Relation Age of Onset  . Diabetes Mellitus II  Sister   . Diabetes Mellitus II Brother   . CAD Brother    Social History:  reports that she has quit smoking. Her smokeless tobacco use includes Snuff. She reports that she does not drink alcohol or use drugs. No Known Allergies Prior to Admission medications   Medication Sig Start Date End Date Taking? Authorizing Provider  acetaminophen (TYLENOL) 500 MG tablet Take 500-1,000 mg by mouth every 6 (six) hours as needed (for pain).    Yes [provider]  albuterol (PROVENTIL HFA;VENTOLIN HFA) 108 (90 BASE) MCG/ACT inhaler Inhale 2 puffs into the lungs every 6 (six) hours as needed for wheezing or shortness of breath.    Yes [provider]  amLODipine (NORVASC) 10 MG tablet Take 10 mg by mouth daily.    Yes [provider]  aspirin EC 81 MG tablet Take 81 mg by mouth daily.   Yes [provider]  calcitRIOL (ROCALTROL) 0.25 MCG capsule Take 0.25 mcg by mouth daily.   Yes [provider]  calcium acetate (PHOSLO) 667 MG capsule Take 667 mg by mouth 3 (three) times daily with meals.   Yes [provider]  carvedilol (COREG) 25 MG tablet Take 25 mg by mouth 2 (two) times daily with a meal.   Yes [provider]  dronabinol (MARINOL) 2.5 MG capsule Take 2.5 mg by mouth 2 (two) times daily before a meal.   Yes [provider]  insulin glargine (LANTUS) 100 UNIT/ML injection Inject 0.17 mLs (17 Units total) into the skin at bedtime. 02/12/16  Yes Gherghe, Daylene Katayamaostin M, MD  insulin lispro (HUMALOG) 100 UNIT/ML injection Inject 3-10 Units into the skin 3 (three) times daily before meals. Per sliding scale: "BGL 200 = 3 units; 250 = 5 units; 300 = 7 units; 350 = 9-10 units"   Yes [provider]  metoCLOPramide (REGLAN) 5 MG tablet Take 1 tablet (5 mg total) by mouth 4 (four) times daily -  before meals and at bedtime. 10/26/14  Yes Vann, Jessica U, DO  mirtazapine (REMERON) 15 MG tablet Take 15 mg by mouth at bedtime.   Yes  [provider]  nitroGLYCERIN (NITROSTAT) 0.4 MG SL tablet PLACE ONE TABLET UNDER THE TONGUE EVERY FIVE MINUTES AS NEEDED FOR CHEST PAIN 04/14/16  Yes Bensimhon, Bevelyn Bucklesaniel R, MD  pantoprazole (PROTONIX) 40 MG tablet Take 1 tablet (40 mg total) by mouth 2 (two) times daily. 06/05/13  Yes Chikowski, Vinnie Langtonachel E, MD  polyethylene glycol (MIRALAX / GLYCOLAX) packet Take 17 g by mouth daily. Patient taking differently: Take 17 g by mouth daily as needed for mild constipation.  10/16/14  Yes Zannie CoveJoseph, Preetha, MD  promethazine (PHENERGAN) 25 MG tablet Take 25 mg by mouth 2 (two) times daily as needed for nausea or vomiting.    Yes [provider]  ranitidine (ZANTAC) 150 MG tablet Take 150 mg by mouth daily as needed for heartburn.   Yes [provider]  rosuvastatin (CRESTOR) 10 MG tablet Take 1 tablet (  10 mg total) by mouth daily. 03/16/16  Yes Comer, Belia Heman, MD  traMADol (ULTRAM) 50 MG tablet Take 50 mg by mouth 2 (two) times daily as needed (for pain).    Yes [provider]  Glecaprevir-Pibrentasvir (MAVYRET) 100-40 MG TABS Take 3 tablets by mouth daily with breakfast. Patient not taking: Reported on 10/29/2016 03/16/16   Gardiner Barefoot, MD   Current Facility-Administered Medications  Medication Dose Route Frequency Provider Last Rate Last Dose  . dextrose 5 %-0.45 % sodium chloride infusion   Intravenous Continuous Ethelda Chick, Sam, MD      . heparin injection 5,000 Units  5,000 Units Subcutaneous Q8H Whiteheart, Kathryn A, NP      . insulin regular (NOVOLIN R,HUMULIN R) 100 Units in sodium chloride 0.9 % 100 mL (1 Units/mL) infusion   Intravenous Continuous Ethelda Chick, Sam, MD 10.8 mL/hr at 10/29/16 1525 10.8 Units/hr at 10/29/16 1525  . ondansetron (ZOFRAN) injection 4 mg  4 mg Intravenous Q6H PRN Whiteheart, Mellody Life, NP       Current Outpatient Prescriptions  Medication Sig Dispense Refill  . acetaminophen (TYLENOL) 500 MG tablet Take 500-1,000 mg by mouth every 6  (six) hours as needed (for pain).     Marland Kitchen albuterol (PROVENTIL HFA;VENTOLIN HFA) 108 (90 BASE) MCG/ACT inhaler Inhale 2 puffs into the lungs every 6 (six) hours as needed for wheezing or shortness of breath.     Marland Kitchen amLODipine (NORVASC) 10 MG tablet Take 10 mg by mouth daily.     Marland Kitchen aspirin EC 81 MG tablet Take 81 mg by mouth daily.    . calcitRIOL (ROCALTROL) 0.25 MCG capsule Take 0.25 mcg by mouth daily.    . calcium acetate (PHOSLO) 667 MG capsule Take 667 mg by mouth 3 (three) times daily with meals.    . carvedilol (COREG) 25 MG tablet Take 25 mg by mouth 2 (two) times daily with a meal.    . dronabinol (MARINOL) 2.5 MG capsule Take 2.5 mg by mouth 2 (two) times daily before a meal.    . insulin glargine (LANTUS) 100 UNIT/ML injection Inject 0.17 mLs (17 Units total) into the skin at bedtime. 10 mL 11  . insulin lispro (HUMALOG) 100 UNIT/ML injection Inject 3-10 Units into the skin 3 (three) times daily before meals. Per sliding scale: "BGL 200 = 3 units; 250 = 5 units; 300 = 7 units; 350 = 9-10 units"    . metoCLOPramide (REGLAN) 5 MG tablet Take 1 tablet (5 mg total) by mouth 4 (four) times daily -  before meals and at bedtime. 90 tablet 0  . mirtazapine (REMERON) 15 MG tablet Take 15 mg by mouth at bedtime.    . nitroGLYCERIN (NITROSTAT) 0.4 MG SL tablet PLACE ONE TABLET UNDER THE TONGUE EVERY FIVE MINUTES AS NEEDED FOR CHEST PAIN 30 tablet 3  . pantoprazole (PROTONIX) 40 MG tablet Take 1 tablet (40 mg total) by mouth 2 (two) times daily. 60 tablet 1  . polyethylene glycol (MIRALAX / GLYCOLAX) packet Take 17 g by mouth daily. (Patient taking differently: Take 17 g by mouth daily as needed for mild constipation. ) 14 each 0  . promethazine (PHENERGAN) 25 MG tablet Take 25 mg by mouth 2 (two) times daily as needed for nausea or vomiting.     . ranitidine (ZANTAC) 150 MG tablet Take 150 mg by mouth daily as needed for heartburn.    . rosuvastatin (CRESTOR) 10 MG tablet Take 1 tablet (10 mg total) by  mouth  daily. 30 tablet 11  . traMADol (ULTRAM) 50 MG tablet Take 50 mg by mouth 2 (two) times daily as needed (for pain).     Marland Kitchen Glecaprevir-Pibrentasvir (MAVYRET) 100-40 MG TABS Take 3 tablets by mouth daily with breakfast. (Patient not taking: Reported on 10/29/2016) 84 tablet 2   Labs: Basic Metabolic Panel:  Recent Labs Lab 10/29/16 1238  NA 133*  K 4.0  CL 93*  GLUCOSE >700*  BUN 56*  CREATININE 3.40*   Liver Function Tests:  Recent Labs Lab 10/29/16 1220  AST 17  ALT 10*  ALKPHOS 116  BILITOT 0.7  PROT 5.9*  ALBUMIN 3.1*    Recent Labs Lab 10/29/16 1220  LIPASE 22   No results for input(s): AMMONIA in the last 168 hours. CBC:  Recent Labs Lab 10/29/16 1220 10/29/16 1238  WBC 12.4*  --   NEUTROABS 11.0*  --   HGB 10.8* 11.6*  HCT 34.6* 34.0*  MCV 94.0  --   PLT 122*  --    Cardiac Enzymes: No results for input(s): CKTOTAL, CKMB, CKMBINDEX, TROPONINI in the last 168 hours. CBG:  Recent Labs Lab 10/29/16 1227 10/29/16 1402 10/29/16 1522  GLUCAP >600* >600* >600*   Iron Studies: No results for input(s): IRON, TIBC, TRANSFERRIN, FERRITIN in the last 72 hours. Studies/Results: Ct Abdomen Pelvis Wo Contrast  Result Date: 10/29/2016 CLINICAL DATA:  Seizures today. Vomiting. History of diabetes, end-stage renal disease on dialysis, peptic ulcer disease. EXAM: CT ABDOMEN AND PELVIS WITHOUT CONTRAST TECHNIQUE: Multidetector CT imaging of the abdomen and pelvis was performed following the standard protocol without IV contrast. COMPARISON:  CT abdomen and pelvis February 07, 2017 FINDINGS: LOWER CHEST: Lung bases are clear. The heart size is mildly enlarged. Mild coronary artery calcifications. No pericardial effusion. HEPATOBILIARY: Normal. PANCREAS: Atrophic pancreas. SPLEEN: Normal. ADRENALS/URINARY TRACT: Kidneys are orthotopic, demonstrating normal size and morphology. No nephrolithiasis, hydronephrosis; limited assessment for renal masses on this  nonenhanced examination. The unopacified ureters are normal in course and caliber. Urinary bladder is partially distended and unremarkable. Normal adrenal glands. STOMACH/BOWEL: Small hiatal hernia with fluid distended esophagus. Mild fluid distended stomach. Small air-filled gastric antral probable diverticulum. The small and large bowel are normal in course and caliber without inflammatory changes, sensitivity decreased by lack of enteric contrast. Normal appendix. VASCULAR/LYMPHATIC: Aortoiliac vessels are normal in course and caliber, moderate calcific atherosclerosis severe small vessel calcifications seen with end-stage renal disease. No lymphadenopathy by CT size criteria though sensitivity decreased without contrast. REPRODUCTIVE: Status post hysterectomy. OTHER: No intraperitoneal free fluid or free air. MUSCULOSKELETAL: Mild anasarca. 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 Small fat containing inguinal hernias and, umbilical hernia. Severe L5-S1 degenerative disc. Large L2 and L4 Schmorl's nodes. IMPRESSION: Fluid distended stomach may be transient or, sequelae of gastric paresis. No acute intra-abdominal or pelvic process by noncontrast CT. Mild anasarca. Electronically Signed   By: Awilda Metro M.D.   On: 10/29/2016 14:59   Ct Head Wo Contrast  Result Date: 10/29/2016 CLINICAL DATA:  61 year old female with seizure today. EXAM: CT HEAD WITHOUT CONTRAST TECHNIQUE: Contiguous axial images were obtained from the base of the skull through the vertex without intravenous contrast. COMPARISON:  02/09/2016 head CT and prior studies FINDINGS: Brain: No evidence of acute infarction, hemorrhage, hydrocephalus, extra-axial collection or mass lesion/mass effect. Mild chronic small-vessel white matter ischemic changes again noted. Vascular: Intracranial atherosclerotic calcifications identified. Skull: Normal. Negative for fracture or focal lesion. Sinuses/Orbits: No acute  finding. Other: None. IMPRESSION: No  evidence of acute intracranial abnormality. Mild chronic small-vessel white matter ischemic changes. Electronically Signed   By: Harmon Pier M.D.   On: 10/29/2016 14:55    ROS: As per HPI otherwise negative.    Physical Exam: Vitals:   10/29/16 1506 10/29/16 1515 10/29/16 1520 10/29/16 1525  BP: (!) 190/79 125/75 (!) 164/76 (!) 201/66  Pulse: 95 95 95   Resp: (!) 33 (!) 42 (!) 32   Temp:      TempSrc:      SpO2: 100% 100% 100%   Weight:      Height:         General: Chronically ill appearing female, confused, agitated.  Head: Normocephalic, atraumatic, sclera non-icteric, mucus membranes are moist Neck: Supple. JVD not elevated. Lungs: Clear bilaterally to auscultation without wheezes, rales, or rhonchi. Breathing is unlabored. Heart: RRR with S1 S2,2/6 systolic M.  Abdomen: Soft, non-tender, non-distended with normoactive bowel sounds. No rebound/guarding. No obvious abdominal masses. M-S:  Strength and tone appear normal for age. Lower extremities:without edema or ischemic changes, no open wounds  Neuro: Alert and oriented X 3. Moves all extremities spontaneously. Psych:  Responds to questions appropriately with a normal affect. Dialysis Access:  Dialysis Orders: [facility] T,Th,S 4 hrs 400/Auto 1.5 57.5 kg 3.0 K/2.25 Ca UF Profile 4 -Heparin: 4000 units IV TIW -Hectorol 6 mcg IV TIW (last PTH 389 10/01/16) -Mircera 50 mcg IV q 4 weeks (last dose 10/08/2016 Next dose due 11/05/16 Last HGB 10.5 10/22/16)  BMD Meds: -Senspar 60 mg PO daily No binders on OP Med list   Assessment/Plan: 1.  HHNK/Hyperglycemia: BS > 700. Insulin gtt per primary 2.  AMS: CT of head negative. Mild hyponatremia. Per primary. Considering MRI to R/O PRES.  3.  Abd pain/N & V: CT of Ab/pelvis negative. Per primary 4.  ESRD - T,Th,S. No urgent HD needs at present. Will have HD tomorrow off schedule. 5.  Hypertension/volume  - BP labile-started cardene gtt-now Dc'd  D/T labile BP. No evidence of volume overload on CT of abdomen or exam. No wt available yet. UFG to OP EDW 57.5 kg.  6.  Anemia  - HGB 10.8 follow HGB.  7.  Metabolic bone disease -  Continue VDRA/Sensipar when eating again. Check renal function panel 8.  Nutrition - NPO at present.   Tyanne Derocher H. Manson Passey, NP-C 10/29/2016, 3:56 PM  [employer] [phone]

## 2016-10-30 ENCOUNTER — Encounter (HOSPITAL_COMMUNITY): Payer: Self-pay | Admitting: Gastroenterology

## 2016-10-30 ENCOUNTER — Inpatient Hospital Stay (HOSPITAL_COMMUNITY): Payer: Medicaid Other

## 2016-10-30 DIAGNOSIS — R0902 Hypoxemia: Secondary | ICD-10-CM

## 2016-10-30 DIAGNOSIS — I6783 Posterior reversible encephalopathy syndrome: Secondary | ICD-10-CM

## 2016-10-30 DIAGNOSIS — K922 Gastrointestinal hemorrhage, unspecified: Secondary | ICD-10-CM

## 2016-10-30 LAB — BASIC METABOLIC PANEL WITH GFR
Anion gap: 14 (ref 5–15)
BUN: 85 mg/dL — ABNORMAL HIGH (ref 6–20)
CO2: 24 mmol/L (ref 22–32)
Calcium: 10.4 mg/dL — ABNORMAL HIGH (ref 8.9–10.3)
Chloride: 105 mmol/L (ref 101–111)
Creatinine, Ser: 4.06 mg/dL — ABNORMAL HIGH (ref 0.44–1.00)
GFR calc Af Amer: 13 mL/min — ABNORMAL LOW (ref >60)
GFR calc non Af Amer: 11 mL/min — ABNORMAL LOW (ref >60)
Glucose, Bld: 143 mg/dL — ABNORMAL HIGH (ref 65–99)
Potassium: 2.6 mmol/L — CL (ref 3.5–5.1)
Sodium: 143 mmol/L (ref 135–145)

## 2016-10-30 LAB — CBC
HCT: 30.6 % — ABNORMAL LOW (ref 36.0–46.0)
HCT: 28.9 % — ABNORMAL LOW (ref 36.0–46.0)
HEMOGLOBIN: 10.2 g/dL — AB (ref 12.0–15.0)
Hemoglobin: 9.8 g/dL — ABNORMAL LOW (ref 12.0–15.0)
MCH: 30.2 pg (ref 26.0–34.0)
MCH: 29.8 pg (ref 26.0–34.0)
MCHC: 33.3 g/dL (ref 30.0–36.0)
MCHC: 33.9 g/dL (ref 30.0–36.0)
MCV: 90.5 fL (ref 78.0–100.0)
MCV: 87.8 fL (ref 78.0–100.0)
Platelets: 133 10*3/uL — ABNORMAL LOW (ref 150–400)
Platelets: 129 K/uL — ABNORMAL LOW (ref 150–400)
RBC: 3.38 MIL/uL — AB (ref 3.87–5.11)
RBC: 3.29 MIL/uL — ABNORMAL LOW (ref 3.87–5.11)
RDW: 13.2 % (ref 11.5–15.5)
RDW: 12.9 % (ref 11.5–15.5)
WBC: 20.4 10*3/uL — ABNORMAL HIGH (ref 4.0–10.5)
WBC: 21 10*3/uL — ABNORMAL HIGH (ref 4.0–10.5)

## 2016-10-30 LAB — BASIC METABOLIC PANEL
ANION GAP: 12 (ref 5–15)
BUN: 92 mg/dL — ABNORMAL HIGH (ref 6–20)
CHLORIDE: 105 mmol/L (ref 101–111)
CO2: 24 mmol/L (ref 22–32)
Calcium: 10.3 mg/dL (ref 8.9–10.3)
Creatinine, Ser: 4.11 mg/dL — ABNORMAL HIGH (ref 0.44–1.00)
GFR calc non Af Amer: 11 mL/min — ABNORMAL LOW (ref >60)
GFR, EST AFRICAN AMERICAN: 13 mL/min — AB (ref >60)
Glucose, Bld: 89 mg/dL (ref 65–99)
POTASSIUM: 3.2 mmol/L — AB (ref 3.5–5.1)
SODIUM: 141 mmol/L (ref 135–145)

## 2016-10-30 LAB — GLUCOSE, CAPILLARY
GLUCOSE-CAPILLARY: 52 mg/dL — AB (ref 65–99)
GLUCOSE-CAPILLARY: 49 mg/dL — AB (ref 65–99)
GLUCOSE-CAPILLARY: 71 mg/dL (ref 65–99)
GLUCOSE-CAPILLARY: 121 mg/dL — AB (ref 65–99)
GLUCOSE-CAPILLARY: 169 mg/dL — AB (ref 65–99)
GLUCOSE-CAPILLARY: 183 mg/dL — AB (ref 65–99)
Glucose-Capillary: 104 mg/dL — ABNORMAL HIGH (ref 65–99)
Glucose-Capillary: 204 mg/dL — ABNORMAL HIGH (ref 65–99)
Glucose-Capillary: 40 mg/dL — CL (ref 65–99)
Glucose-Capillary: 52 mg/dL — ABNORMAL LOW (ref 65–99)
Glucose-Capillary: 96 mg/dL (ref 65–99)
Glucose-Capillary: 97 mg/dL (ref 65–99)
Glucose-Capillary: 98 mg/dL (ref 65–99)

## 2016-10-30 LAB — RENAL FUNCTION PANEL
Albumin: 2.6 g/dL — ABNORMAL LOW (ref 3.5–5.0)
Anion gap: 9 (ref 5–15)
BUN: 101 mg/dL — ABNORMAL HIGH (ref 6–20)
CO2: 25 mmol/L (ref 22–32)
Calcium: 10.5 mg/dL — ABNORMAL HIGH (ref 8.9–10.3)
Chloride: 106 mmol/L (ref 101–111)
Creatinine, Ser: 4.25 mg/dL — ABNORMAL HIGH (ref 0.44–1.00)
GFR calc Af Amer: 12 mL/min — ABNORMAL LOW (ref >60)
GFR calc non Af Amer: 11 mL/min — ABNORMAL LOW (ref >60)
Glucose, Bld: 106 mg/dL — ABNORMAL HIGH (ref 65–99)
Phosphorus: 1.9 mg/dL — ABNORMAL LOW (ref 2.5–4.6)
Potassium: 3.4 mmol/L — ABNORMAL LOW (ref 3.5–5.1)
Sodium: 140 mmol/L (ref 135–145)

## 2016-10-30 LAB — TROPONIN I: Troponin I: 0.08 ng/mL (ref <0.03)

## 2016-10-30 LAB — AMMONIA: AMMONIA: 11 umol/L (ref 9–35)

## 2016-10-30 LAB — HEMOGLOBIN AND HEMATOCRIT, BLOOD
HCT: 27.1 % — ABNORMAL LOW (ref 36.0–46.0)
HCT: 25.4 % — ABNORMAL LOW (ref 36.0–46.0)
HEMOGLOBIN: 9.1 g/dL — AB (ref 12.0–15.0)
Hemoglobin: 8.4 g/dL — ABNORMAL LOW (ref 12.0–15.0)

## 2016-10-30 LAB — TYPE AND SCREEN
ABO/RH(D): O POS
Antibody Screen: NEGATIVE

## 2016-10-30 MED ORDER — SODIUM CHLORIDE 0.9 % IV SOLN: 100.0000 mL | INTRAVENOUS | Status: DC | PRN

## 2016-10-30 MED ORDER — CHLORHEXIDINE GLUCONATE CLOTH 2 % EX PADS
6.0000 | MEDICATED_PAD | Freq: Every day | CUTANEOUS | Status: DC
  Administered 2016-10-30 – 2016-10-31 (×2): 6 via TOPICAL

## 2016-10-30 MED ORDER — PENTAFLUOROPROP-TETRAFLUOROETH EX AERO: 1.0000 "application " | INHALATION_SPRAY | CUTANEOUS | Status: DC | PRN

## 2016-10-30 MED ORDER — FENTANYL CITRATE (PF) 100 MCG/2ML IJ SOLN
INTRAMUSCULAR | Status: AC
  Administered 2016-10-30: 25 ug
  Filled 2016-10-30: qty 2

## 2016-10-30 MED ORDER — SODIUM CHLORIDE 0.9% FLUSH
10.0000 mL | Freq: Two times a day (BID) | INTRAVENOUS | Status: DC
  Administered 2016-10-30 – 2016-11-01 (×3): 10 mL

## 2016-10-30 MED ORDER — LIDOCAINE HCL (PF) 1 % IJ SOLN: 5.0000 mL | INTRAMUSCULAR | Status: DC | PRN

## 2016-10-30 MED ORDER — HEPARIN SODIUM (PORCINE) 1000 UNIT/ML DIALYSIS
4000.0000 [IU] | Freq: Once | INTRAMUSCULAR | Status: AC
  Administered 2016-10-30: 4000 [IU] via INTRAVENOUS_CENTRAL

## 2016-10-30 MED ORDER — INSULIN ASPART 100 UNIT/ML ~~LOC~~ SOLN
0.0000 [IU] | SUBCUTANEOUS | Status: DC
  Administered 2016-10-30: 2 [IU] via SUBCUTANEOUS
  Administered 2016-10-30: 3 [IU] via SUBCUTANEOUS
  Administered 2016-10-30 – 2016-10-31 (×5): 2 [IU] via SUBCUTANEOUS
  Administered 2016-10-31: 1 [IU] via SUBCUTANEOUS
  Administered 2016-11-01: 2 [IU] via SUBCUTANEOUS
  Administered 2016-11-01: 1 [IU] via SUBCUTANEOUS

## 2016-10-30 MED ORDER — LIDOCAINE-PRILOCAINE 2.5-2.5 % EX CREA: 1.0000 "application " | TOPICAL_CREAM | CUTANEOUS | Status: DC | PRN

## 2016-10-30 MED ORDER — DEXTROSE 50 % IV SOLN
INTRAVENOUS | Status: AC
  Administered 2016-10-30 (×2): 19 mL
  Filled 2016-10-30: qty 50

## 2016-10-30 MED ORDER — SODIUM CHLORIDE 0.9% FLUSH: 10.0000 mL | INTRAVENOUS | Status: DC | PRN

## 2016-10-30 MED ORDER — CHLORHEXIDINE GLUCONATE CLOTH 2 % EX PADS: 6.0000 | MEDICATED_PAD | Freq: Every day | CUTANEOUS | Status: DC

## 2016-10-30 MED ORDER — POTASSIUM CHLORIDE 10 MEQ/100ML IV SOLN
10.0000 meq | INTRAVENOUS | Status: AC
  Administered 2016-10-30 (×6): 10 meq via INTRAVENOUS
  Filled 2016-10-30 (×6): qty 100

## 2016-10-30 MED ORDER — PANTOPRAZOLE SODIUM 40 MG IV SOLR
40.0000 mg | Freq: Two times a day (BID) | INTRAVENOUS | Status: DC
  Administered 2016-11-03 (×2): 40 mg via INTRAVENOUS
  Filled 2016-10-30 (×2): qty 40

## 2016-10-30 MED ORDER — DEXTROSE 50 % IV SOLN
INTRAVENOUS | Status: AC
  Administered 2016-10-30: 20 mL
  Filled 2016-10-30: qty 50

## 2016-10-30 MED ORDER — SODIUM CHLORIDE 0.9 % IV SOLN
80.0000 mg | Freq: Once | INTRAVENOUS | Status: AC
  Administered 2016-10-30: 80 mg via INTRAVENOUS
  Filled 2016-10-30: qty 80

## 2016-10-30 MED ORDER — DEXTROSE 50 % IV SOLN
25.0000 mL | Freq: Once | INTRAVENOUS | Status: AC
  Administered 2016-10-30: 25 mL via INTRAVENOUS

## 2016-10-30 MED ORDER — SODIUM CHLORIDE 0.9 % IV SOLN
8.0000 mg/h | INTRAVENOUS | Status: AC
  Administered 2016-10-30 – 2016-11-01 (×6): 8 mg/h via INTRAVENOUS
  Filled 2016-10-30 (×12): qty 80

## 2016-10-30 MED ORDER — SODIUM CHLORIDE 0.9% FLUSH
10.0000 mL | Freq: Two times a day (BID) | INTRAVENOUS | Status: DC
  Administered 2016-10-31: 10 mL
  Administered 2016-10-31: 20 mL
  Administered 2016-11-01: 10 mL

## 2016-10-30 NOTE — Progress Notes (Signed)
RN notified of pt temp of 100.4 F orally 

## 2016-10-30 NOTE — Procedures (Signed)
History: 61 year old female with altered mental status  Sedation: None  Technique: This is a 21 channel routine scalp EEG performed at the bedside with bipolar and monopolar montages arranged in accordance to the international 10/20 system of electrode placement. One channel was dedicated to EKG recording.    Background: There is a posterior dominant rhythm of 9 Hz that is seen. In addition, there is diffuse high voltage irregular delta and theta activities throughout the recording. The background is disorganized.  Photic stimulation: Physiologic driving is not performed  EEG Abnormalities: 1) generalized irregular slow activity  Clinical Interpretation: This EEG is consistent with a moderate generalized nonspecific cerebral dysfunction (encephalopathy). Please note that a normal EEG does not preclude the possibility of epilepsy.   Madeline SlotMcNeill Sienna Stonehocker, MD [facility] [phone]  If 7pm- 7am, please [place] neurology on call as listed in AMION.

## 2016-10-30 NOTE — Consult Note (Signed)
Neurology Consultation Reason for Consult: Altered mental status Referring Physician: Molli KnockYacoub, J  CC: Altered mental status  History is obtained from: Family  HPI: Madeline [name] is a 61 y.o. female with a history of repeated episodes of altered mental status in the setting of severe hypoglycemia who presented with a glucose of 900 yesterday. In addition, she was uremic. She debris seizure-like episode in the ER and had severely elevated blood pressures on arrival.  Today, she remains encephalopathic and therefore neurology has been consulted.    ROS: Unable to obtain due to altered mental status.   Past Medical History:  Diagnosis Date  . Abnormal Doppler ultrasound of carotid artery    a. Per [place] records: <50% LICA.  Marland Kitchen. Anasarca    a. Per [place] records - due to pulm HTN with R HF.   Marland Kitchen. Aseptic meningitis    a. 09/2012: adm in [place] for metabolic encephalopathy, oliguric tubular necrosis, anemia, HTN, possible CVA, HHNKA.  Marland Kitchen. CHF (congestive heart failure) (HCC)    a. HFpEF with RHF/anasarca/pHTN.  . CKD (chronic kidney disease), stage III    a. Per [place] records: h/o ARF after CTA that ruled out PE.  Marland Kitchen. COPD (chronic obstructive pulmonary disease) (HCC)   . Coronary artery disease    a. Per [place] records: NSTEMI 01/2012, tx medically given ARF but suspected CAD. b. Stress test 12/16/11 reported w/o ischemia.  Marland Kitchen. CVA (cerebral infarction)    a. Per [place] records, "possible CVA" 09/2012 but MRI reportedly negative.  . Diabetes mellitus (HCC)   . Dialysis patient [facility](H[facility])    Mon, Wed, Fridays  . Hypertension   . OSA (obstructive sleep apnea)    a. Pt reported used to use CPAP in [place], but [facility]  Marland Kitchen. Pulmonary hypertension (HCC)    a. RHC 02/28/13: mod pulm HTN with normal PVR suggestive of predominantly pulmonary venous HTN.  Marland Kitchen. Renal insufficiency    dialysis 3 days/week  . Right heart failure (HCC)   . Stroke [facility](HCC) 2014     Family History  Problem Relation Age of Onset  . Diabetes  Mellitus II Sister   . Diabetes Mellitus II Brother   . CAD Brother      Social History:  reports that she has quit smoking. Her smokeless tobacco use includes Snuff. She reports that she does not drink alcohol or use drugs.   Exam: Current vital signs: BP 120/64   Pulse 93   Temp 99.1 F (37.3 C) (Oral)   Resp 19   Ht 4\' 9"  (1.448 m)   Wt 59.5 kg (131 lb 2.8 oz)   SpO2 100%   BMI 28.39 kg/m  Vital signs in last 24 hours: Temp:  [97 F (36.1 C)-100.4 F (38 C)] 99.1 F (37.3 C) (06/22 2026) Pulse Rate:  [75-98] 93 (06/22 1206) Resp:  [19-38] 19 (06/22 0939) BP: (86-195)/(46-116) 120/64 (06/22 1206) SpO2:  [98 %-100 %] 100 % (06/22 1206) Weight:  [59.1 kg (130 lb 4.7 oz)-59.5 kg (131 lb 2.8 oz)] 59.5 kg (131 lb 2.8 oz) (06/22 1328)   Physical Exam  Constitutional: Appears well-developed and well-nourished.  Psych: Affect appropriate to situation Eyes: No scleral injection HENT: No OP obstrucion Head: Normocephalic.  Cardiovascular: Normal rate and regular rhythm.  Respiratory: Effort normal and breath sounds normal to anterior ascultation GI: Soft.  No distension. There is no tenderness.  Skin: WDI  Neuro: Mental Status: Patient is obtunded, but does awaken and follow commands  to stick out tongue and wiggle toes. Cranial Nerves: II: Visual Fields are difficult to assess given that the patient keeps her eyes tightly shut.. Pupils are equal, round, and reactive to light.   III,IV, VI: She does not look to either direction to command, but doll's eye maneuver is intact V: VII: She based eyelid stimulation bilaterally Motor: She withdraws to noxious stimuli in all 4 extremities Sensory: As above  Cerebellar: Does not perform  I have reviewed labs in epic and the results pertinent to this consultation are: Elevated creatinine, BUN of 101, hypercalcemia  I have reviewed the images obtained: CT head-unremarkable  Impression: 61 year old female with multiple  metabolic abnormalities including uremia, hyperglycemia, presumed GI bleed. I suspect that her encephalopathy is metabolic in nature, though given that she did have a seizure would be reasonable to obtain an EEG. Also with her hypertension, pres would be a consideration  Recommendations: 1) MRI brain 2) EEG 3) treatment of underlying metabolic abnormalities   Ritta Slot, MD [facility] [phone]  If 7pm- 7am, please [place] neurology on call as listed in AMION.

## 2016-10-30 NOTE — Consult Note (Addendum)
PULMONARY / CRITICAL CARE MEDICINE   Name: Madeline [name] MRN: [id number] DOB: 1956-03-29    ADMISSION DATE:  10/29/2016  REFERRING MD:  EDP   CHIEF COMPLAINT:  AMS, HTN, HHNK   HISTORY OF PRESENT ILLNESS:   61yo female with hx ESRD, HTN, OSA, pulmonary hypertension, CHF who presented 6/21 with c/o abd pain, n/v and AMS.   In ER she had brief seizure-like episode which improved with Ativan.  W/u was also notable for glucose >700, BP 238/97, wbc 12.4, Scr 3.4.  She was started on cardene gtt and PCCM called for ICU admission.   SUBJECTIVE:  Upper GI bleeding during dialysis.  VITAL SIGNS: BP (!) 185/69   Pulse 83   Temp 97.7 F (36.5 C) (Oral)   Resp 19   Ht 4\' 9"  (1.448 m)   Wt 59.1 kg (130 lb 4.7 oz)   SpO2 100%   BMI 28.19 kg/m   HEMODYNAMICS:    VENTILATOR SETTINGS:    INTAKE / OUTPUT: I/O last 3 completed shifts: In: 1308 [I.V.:808; IV Piggyback:500] Out: -   PHYSICAL EXAMINATION: General:  Thin, chronically ill appearing female, NAD  Neuro:  Lethargic, incomprehensible speech, protecting airway  HEENT:  Mm dry, no JVD  Cardiovascular:  s1s2 rrr, HTN  Lungs:  resps even, non labored  Abdomen:  Round, soft, non tender, +bs  Musculoskeletal:  Warm and dry, no edema   LABS:  BMET  Recent Labs Lab 10/30/16 0041 10/30/16 0357 10/30/16 0930  NA 143 141 140  K 2.6* 3.2* 3.4*  CL 105 105 106  CO2 24 24 25   BUN 85* 92* 101*  CREATININE 4.06* 4.11* 4.25*  GLUCOSE 143* 89 106*   Electrolytes  Recent Labs Lab 10/30/16 0041 10/30/16 0357 10/30/16 0930  CALCIUM 10.4* 10.3 10.5*  PHOS  --   --  1.9*   CBC  Recent Labs Lab 10/29/16 1619 10/30/16 0357 10/30/16 0930  WBC 13.6* 20.4* 21.0*  HGB 10.0* 10.2* 9.8*  HCT 31.2* 30.6* 28.9*  PLT 125* 133* 129*   Coag's No results for input(s): APTT, INR in the last 168 hours.  Sepsis Markers  Recent Labs Lab 10/29/16 1619 10/29/16 1829  LATICACIDVEN 3.3* 3.3*  PROCALCITON 0.18  --     ABG  Recent Labs Lab 10/29/16 1609  PHART 7.441  PCO2ART 38.7  PO2ART 71.0*   Liver Enzymes  Recent Labs Lab 10/29/16 1220 10/30/16 0930  AST 17  --   ALT 10*  --   ALKPHOS 116  --   BILITOT 0.7  --   ALBUMIN 3.1* 2.6*   Cardiac Enzymes  Recent Labs Lab 10/29/16 1619 10/29/16 2202 10/30/16 0357  TROPONINI 0.04* 0.05* 0.08*   Glucose  Recent Labs Lab 10/30/16 0223 10/30/16 0336 10/30/16 0352 10/30/16 0441 10/30/16 0735 10/30/16 0822  GLUCAP 97 49* 96 71 40* 104*   Imaging Ct Abdomen Pelvis Wo Contrast  Result Date: 10/29/2016 CLINICAL DATA:  Seizures today. Vomiting. History of diabetes, end-stage renal disease on dialysis, peptic ulcer disease. EXAM: CT ABDOMEN AND PELVIS WITHOUT CONTRAST TECHNIQUE: Multidetector CT imaging of the abdomen and pelvis was performed following the standard protocol without IV contrast. COMPARISON:  CT abdomen and pelvis February 07, 2017 FINDINGS: LOWER CHEST: Lung bases are clear. The heart size is mildly enlarged. Mild coronary artery calcifications. No pericardial effusion. HEPATOBILIARY: Normal. PANCREAS: Atrophic pancreas. SPLEEN: Normal. ADRENALS/URINARY TRACT: Kidneys are orthotopic, demonstrating normal size and morphology. No nephrolithiasis, hydronephrosis; limited assessment for renal masses on this  nonenhanced examination. The unopacified ureters are normal in course and caliber. Urinary bladder is partially distended and unremarkable. Normal adrenal glands. STOMACH/BOWEL: Small hiatal hernia with fluid distended esophagus. Mild fluid distended stomach. Small air-filled gastric antral probable diverticulum. The small and large bowel are normal in course and caliber without inflammatory changes, sensitivity decreased by lack of enteric contrast. Normal appendix. VASCULAR/LYMPHATIC: Aortoiliac vessels are normal in course and caliber, moderate calcific atherosclerosis severe small vessel calcifications seen with end-stage renal  disease. No lymphadenopathy by CT size criteria though sensitivity decreased without contrast. REPRODUCTIVE: Status post hysterectomy. OTHER: No intraperitoneal free fluid or free air. MUSCULOSKELETAL: Mild anasarca. 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 Small fat containing inguinal hernias and, umbilical hernia. Severe L5-S1 degenerative disc. Large L2 and L4 Schmorl's nodes. IMPRESSION: Fluid distended stomach may be transient or, sequelae of gastric paresis. No acute intra-abdominal or pelvic process by noncontrast CT. Mild anasarca. Electronically Signed   By: Awilda Metro M.D.   On: 10/29/2016 14:59   Ct Head Wo Contrast  Result Date: 10/29/2016 CLINICAL DATA:  61 year old female with seizure today. EXAM: CT HEAD WITHOUT CONTRAST TECHNIQUE: Contiguous axial images were obtained from the base of the skull through the vertex without intravenous contrast. COMPARISON:  02/09/2016 head CT and prior studies FINDINGS: Brain: No evidence of acute infarction, hemorrhage, hydrocephalus, extra-axial collection or mass lesion/mass effect. Mild chronic small-vessel white matter ischemic changes again noted. Vascular: Intracranial atherosclerotic calcifications identified. Skull: Normal. Negative for fracture or focal lesion. Sinuses/Orbits: No acute finding. Other: None. IMPRESSION: No evidence of acute intracranial abnormality. Mild chronic small-vessel white matter ischemic changes. Electronically Signed   By: Harmon Pier M.D.   On: 10/29/2016 14:55     STUDIES:  CT head 6/21 > neg acute  CT abd/pelvis 6/21 > Fluid distended stomach may be transient or, sequelae of gastric Paresis.  No acute intra-abdominal or pelvic process by noncontrast CT.  CULTURES: BC x 2 6/21>>> MRSA by PCR 6/21 > Negative   ANTIBIOTICS: Non  SIGNIFICANT EVENTS: 6/21 > Presents to ED with HHNK and AMS   LINES/TUBES: PIV Left AV Graft   DISCUSSION: 61yo female with hx DM, ESRD,  HTN presenting with HTN crisis, HHNK, AMS.   ASSESSMENT / PLAN:  PULMONARY Risk for Respiratory Insufficiency r/t AMS  P:   Maintain Oxygen >92 Monitor for need for intubation   CARDIOVASCULAR HTN crisis  Elevated Troponin > most likely demand ischemia, no EKG changes 0.04>0.05>0.06  P:  Cardiac Monitoring  Maintain Systolic <160 - on and off cardene drip PRN Labetalol and Hydralazine   RENAL ESRD - t/th/s HD.  Did not have HD 6/21 Hyponatremia - mild   Hypokalemia  P:   Nephrology Following - HD done x2 hours today HD 6/22  Trend BMP Replace electrolytes as needed  KVO IVF  GASTROINTESTINAL Nausea/vomiting, abd pain  -CT abd unremarkable  P:   NPO  PRN zofran   HEMATOLOGIC Anemia - chronic  P:  Trend CBC Sub q heparin  Transfuse for Hemoglobin <7  INFECTIOUS Leucocytosis  -Procal 0.18, Lactic Acid 3.3  P:   Trend WBC and Fever Curve Follow Culture Data   ENDOCRINE DM  HHNK   P:   D/C insulin drip. On and off insulin and glucose Trend Glucose   NEUROLOGIC AMS - r/t HTN crisis and HHNK  ?seizure activity - CT head neg  P:   Monitor  Consult Neurology. Will attempt MRI Brain (Patient was non-cooperative last night)  FAMILY  - Updates:  Daughter updated at bedside 6/22  - Inter-disciplinary family meet or Palliative Care meeting due by:  6/28  The patient is critically ill with multiple organ systems failure and requires high complexity decision making for assessment and support, frequent evaluation and titration of therapies, application of advanced monitoring technologies and extensive interpretation of multiple databases.   Critical Care Time devoted to patient care services described in this note is  35  Minutes. This time reflects time of care of this signee Dr Koren BoundWesam Yacoub. This critical care time does not reflect procedure time, or teaching time or supervisory time of PA/NP/Med student/Med Resident etc but could involve care discussion  time.  Alyson ReedyWesam G. Yacoub, M.D. [facility]. Pager: [phone]. After hours pager: [phone].

## 2016-10-30 NOTE — Progress Notes (Signed)
eLink Physician-Brief Progress Note Patient Name: Madeline [name] DOB: 06/05/1955 MRN: [id number]   Date of Service  10/30/2016  HPI/Events of Note  Hypokalemia in the setting of ESRD  eICU Interventions  Potassium replaced     Intervention Category Intermediate Interventions: Electrolyte abnormality - evaluation and management  Madeline [name] 10/30/2016, 1:44 AM

## 2016-10-30 NOTE — Procedures (Signed)
Central Venous Catheter Insertion Procedure Note Madeline [name] [id number] [birth year]  Procedure: Insertion of Central Venous Catheter Indications: Assessment of intravascular volume, Drug and/or fluid administration and Frequent blood sampling  Procedure Details Consent: Risks of procedure as well as the alternatives and risks of each were explained to the (patient/caregiver).  Consent for procedure obtained. Time Out: Verified patient identification, verified procedure, site/side was marked, verified correct patient position, special equipment/implants available, medications/allergies/relevent history reviewed, required imaging and test results available.  Performed  Maximum sterile technique was used including antiseptics, cap, gloves, gown, hand hygiene, mask and sheet. Skin prep: Chlorhexidine; local anesthetic administered A antimicrobial bonded/coated triple lumen catheter was placed in the left internal jugular vein using the Seldinger technique.  Evaluation Blood flow good Complications: No apparent complications Patient did tolerate procedure well. Chest X-ray ordered to verify placement.  CXR: pending.  Madeline KussmaulKatalina [name], AGACNP-BC [facility]  Pgr: [phone]  PCCM Pgr: [phone]  Madeline ReedyWesam G. Madeline [name], M.D. [facility]. Pager: [phone]. After hours pager: [phone].

## 2016-10-30 NOTE — Progress Notes (Signed)
CRITICAL VALUE ALERT  Critical Value:  K 2.6  Date & Time Notied: 10/30/16 1:39 AM  Provider Notified: Pola CornElink MD  Orders Received/Actions taken: see MAR

## 2016-10-30 NOTE — Progress Notes (Signed)
Hemodialysis: Pt began to have vomiting and diarrhea about halfway through tx.  Dark colored emesis and stool noted.  Rapid Response called and came to bedside to assist.  Nephology PA aware and ordered tx to be dc'd at this time. No change in VS or mental status. Transferred back to 2M07.

## 2016-10-30 NOTE — Progress Notes (Signed)
Bedside EEG completed, results pending. 

## 2016-10-30 NOTE — Progress Notes (Signed)
Dr. Jamison NeighborNestor was notified of minimal IV access. One PIV currently patent but won't run at increased rates and no blood return.  IV RN unable to get new site.

## 2016-10-30 NOTE — Progress Notes (Signed)
eLink Physician-Brief Progress Note Patient Name: Madeline BadDorine Semper DOB: December 14, 1955 MRN: [id number]   Date of Service  10/30/2016  HPI/Events of Note  Portable CXR reviewed post left IJ CVL placement. No evidence of pneumothorax & line in good position in SVC.  eICU Interventions  1. VAST order set placed 2. Ok to use central line     Intervention Category Major Interventions: Other:  Lawanda CousinsJennings Stevie Ertle 10/30/2016, 9:01 PM

## 2016-10-30 NOTE — Consult Note (Signed)
Reason for Consult: Coffee ground emesis Referring Physician: Hospital team  Madeline [name] is an 61 y.o. female.  HPI: Patient seen and examined and her computer chart and our office computer chart reviewed and she has only been seen in the hospital by [place] in the last time 2014 and she's had multiple admissions with sugars out of control with some episodic GI symptoms however they usually quickly resolve and she's had a few CT scans over the years the last one on admission which just showed probable gastroparesis and her 2014 endoscopy was reviewed and she is on pump inhibitors at home but does take an aspirin but no extra nonsteroidals and her case was discussed with her daughter who is a Marine scientist and today and dialysis she did throw up some dark material and did have a dark bowel movement and they held off on dialysis and we discussed repeat endoscopy but the ICU doctor think she probably would need to be intubated for that which supposedly the patient does not want according to the daughter in between these spells she does not have any GI symptoms  Past Medical History:  Diagnosis Date  . Abnormal Doppler ultrasound of carotid artery    a. Per [place] records: <50% LICA.  Marland Kitchen Anasarca    a. Per [place] records - due to pulm HTN with R HF.   Marland Kitchen Aseptic meningitis    a. 09/2012: adm in [place] for metabolic encephalopathy, oliguric tubular necrosis, anemia, HTN, possible CVA, HHNKA.  Marland Kitchen CHF (congestive heart failure) ([place])    a. HFpEF with RHF/anasarca/pHTN.  . CKD (chronic kidney disease), stage III    a. Per [place] records: h/o ARF after CTA that ruled out PE.  Marland Kitchen COPD (chronic obstructive pulmonary disease) ([place])   . Coronary artery disease    a. Per [place] records: NSTEMI 01/2012, tx medically given ARF but suspected CAD. b. Stress test 12/16/11 reported w/o ischemia.  Marland Kitchen CVA (cerebral infarction)    a. Per [place] records, "possible CVA" 09/2012 but MRI reportedly negative.  . Diabetes mellitus ([place])   . Dialysis patient [facility])     Mon, Wed, Fridays  . Hypertension   . OSA (obstructive sleep apnea)    a. Pt reported used to use CPAP in [place], but "ran out" when came to [facility].  Marland Kitchen Pulmonary hypertension ([place])    a. [place] 02/28/13: mod pulm HTN with normal PVR suggestive of predominantly pulmonary venous HTN.  Marland Kitchen Renal insufficiency    dialysis 3 days/week  . Right heart failure ([place])   . Stroke [facility]) 2014    Past Surgical History:  Procedure Laterality Date  . ABDOMINAL HYSTERECTOMY    . AV FISTULA PLACEMENT Left 03/03/2013   Procedure: ARTERIOVENOUS (AV) FISTULA CREATION, Brachial/Cephalic;  Surgeon: Rosetta Posner, MD;  Location: [place];  Service: Vascular;  Laterality: Left;  . ESOPHAGOGASTRODUODENOSCOPY N/A 02/16/2013   Procedure: ESOPHAGOGASTRODUODENOSCOPY (EGD);  Surgeon: Wonda Horner, MD;  Location: [facility] ENDOSCOPY;  Service: Endoscopy;  Laterality: N/A;  . INSERTION OF DIALYSIS CATHETER Right 03/03/2013   Procedure: INSERTION OF DIALYSIS CATHETER;  Surgeon: Rosetta Posner, MD;  Location: [place];  Service: Vascular;  Laterality: Right;  Right Internal Jugular Placement  . RIGHT HEART CATHETERIZATION N/A 02/28/2013   Procedure: RIGHT HEART CATH;  Surgeon: Jolaine Artist, MD;  Location[facility] CATH LAB;  Service: Cardiovascular;  Laterality: N/A;  . SHUNTOGRAM N/A 07/13/2013   Procedure: FISTULOGRAM;  Surgeon: Conrad Marquand, MD;  Location: [facility] CATH LAB;  Service: Cardiovascular;  Laterality: N/A;  . TUBAL LIGATION      Family History  Problem Relation Age of Onset  . Diabetes Mellitus II Sister   . Diabetes Mellitus II Brother   . CAD Brother     Social History:  reports that she has quit smoking. Her smokeless tobacco use includes Snuff. She reports that she does not drink alcohol or use drugs.  Allergies: Not on File  Medications: I have reviewed the patient's current medications.  Results for orders placed or performed during the hospital encounter of 10/29/16 (from the past 48 hour(s))  CBC with Differential/Platelet      Status: Abnormal   Collection Time: 10/29/16 12:20 PM  Result Value Ref Range   WBC 12.4 (H) 4.0 - 10.5 K/uL   RBC 3.68 (L) 3.87 - 5.11 MIL/uL   Hemoglobin 10.8 (L) 12.0 - 15.0 g/dL   HCT 34.6 (L) 36.0 - 46.0 %   MCV 94.0 78.0 - 100.0 fL   MCH 29.3 26.0 - 34.0 pg   MCHC 31.2 30.0 - 36.0 g/dL   RDW 13.1 11.5 - 15.5 %   Platelets 122 (L) 150 - 400 K/uL   Neutrophils Relative % 89 %   Neutro Abs 11.0 (H) 1.7 - 7.7 K/uL   Lymphocytes Relative 5 %   Lymphs Abs 0.6 (L) 0.7 - 4.0 K/uL   Monocytes Relative 6 %   Monocytes Absolute 0.8 0.1 - 1.0 K/uL   Eosinophils Relative 0 %   Eosinophils Absolute 0.0 0.0 - 0.7 K/uL   Basophils Relative 0 %   Basophils Absolute 0.0 0.0 - 0.1 K/uL  Lipase, blood     Status: None   Collection Time: 10/29/16 12:20 PM  Result Value Ref Range   Lipase 22 11 - 51 U/L  Hepatic function panel     Status: Abnormal   Collection Time: 10/29/16 12:20 PM  Result Value Ref Range   Total Protein 5.9 (L) 6.5 - 8.1 g/dL   Albumin 3.1 (L) 3.5 - 5.0 g/dL   AST 17 15 - 41 U/L   ALT 10 (L) 14 - 54 U/L   Alkaline Phosphatase 116 38 - 126 U/L   Total Bilirubin 0.7 0.3 - 1.2 mg/dL   Bilirubin, Direct 0.2 0.1 - 0.5 mg/dL   Indirect Bilirubin 0.5 0.3 - 0.9 mg/dL  CBG monitoring, ED     Status: Abnormal   Collection Time: 10/29/16 12:27 PM  Result Value Ref Range   Glucose-Capillary >600 (HH) 65 - 99 mg/dL  I-stat troponin, ED     Status: None   Collection Time: 10/29/16 12:36 PM  Result Value Ref Range   Troponin i, poc 0.03 0.00 - 0.08 ng/mL   Comment 3            Comment: Due to the release kinetics of cTnI, a negative result within the first hours of the onset of symptoms does not rule out myocardial infarction with certainty. If myocardial infarction is still suspected, repeat the test at appropriate intervals.   I-stat chem 8, ed     Status: Abnormal   Collection Time: 10/29/16 12:38 PM  Result Value Ref Range   Sodium 133 (L) 135 - 145 mmol/L    Potassium 4.0 3.5 - 5.1 mmol/L   Chloride 93 (L) 101 - 111 mmol/L   BUN 56 (H) 6 - 20 mg/dL   Creatinine, Ser 3.40 (H) 0.44 - 1.00 mg/dL   Glucose, Bld >700 (HH) 65 - 99  mg/dL   Calcium, Ion 1.20 1.15 - 1.40 mmol/L   TCO2 27 0 - 100 mmol/L   Hemoglobin 11.6 (L) 12.0 - 15.0 g/dL   HCT 34.0 (L) 36.0 - 46.0 %   Comment NOTIFIED PHYSICIAN   I-Stat Venous Blood Gas, ED (order at [facility] and [facility] only)     Status: Abnormal   Collection Time: 10/29/16 12:39 PM  Result Value Ref Range   pH, Ven 7.441 (H) 7.250 - 7.430   pCO2, Ven 40.5 (L) 44.0 - 60.0 mmHg   pO2, Ven 50.0 (H) 32.0 - 45.0 mmHg   Bicarbonate 27.6 20.0 - 28.0 mmol/L   TCO2 29 0 - 100 mmol/L   O2 Saturation 87.0 %   Acid-Base Excess 3.0 (H) 0.0 - 2.0 mmol/L   Patient temperature HIDE    Sample type VENOUS   CBG monitoring, ED     Status: Abnormal   Collection Time: 10/29/16  2:02 PM  Result Value Ref Range   Glucose-Capillary >600 (HH) 65 - 99 mg/dL   Comment 1 Notify RN   CBG monitoring, ED     Status: Abnormal   Collection Time: 10/29/16  3:22 PM  Result Value Ref Range   Glucose-Capillary >600 (HH) 65 - 99 mg/dL  I-Stat arterial blood gas, ED     Status: Abnormal   Collection Time: 10/29/16  4:09 PM  Result Value Ref Range   pH, Arterial 7.441 7.350 - 7.450   pCO2 arterial 38.7 32.0 - 48.0 mmHg   pO2, Arterial 71.0 (L) 83.0 - 108.0 mmHg   Bicarbonate 26.4 20.0 - 28.0 mmol/L   TCO2 28 0 - 100 mmol/L   O2 Saturation 95.0 %   Acid-Base Excess 2.0 0.0 - 2.0 mmol/L   Patient temperature 98.6 F    Collection site RADIAL, ALLEN'S TEST ACCEPTABLE    Drawn by RT    Sample type ARTERIAL   Basic metabolic panel     Status: Abnormal   Collection Time: 10/29/16  4:19 PM  Result Value Ref Range   Sodium 134 (L) 135 - 145 mmol/L   Potassium 3.4 (L) 3.5 - 5.1 mmol/L   Chloride 96 (L) 101 - 111 mmol/L   CO2 27 22 - 32 mmol/L   Glucose, Bld 892 (HH) 65 - 99 mg/dL    Comment: CRITICAL RESULT CALLED TO, READ BACK BY AND VERIFIED  WITHVito Berger RN [phone] [id number] GREEN R    BUN 68 (H) 6 - 20 mg/dL   Creatinine, Ser 3.99 (H) 0.44 - 1.00 mg/dL   Calcium 9.6 8.9 - 10.3 mg/dL   GFR calc non Af Amer 11 (L) >60 mL/min   GFR calc Af Amer 13 (L) >60 mL/min    Comment: (NOTE) The eGFR has been calculated using the CKD EPI equation. This calculation has not been validated in all clinical situations. eGFR's persistently <60 mL/min signify possible Chronic Kidney Disease.    Anion gap 11 5 - 15  CBC     Status: Abnormal   Collection Time: 10/29/16  4:19 PM  Result Value Ref Range   WBC 13.6 (H) 4.0 - 10.5 K/uL   RBC 3.37 (L) 3.87 - 5.11 MIL/uL   Hemoglobin 10.0 (L) 12.0 - 15.0 g/dL   HCT 31.2 (L) 36.0 - 46.0 %   MCV 92.6 78.0 - 100.0 fL   MCH 29.7 26.0 - 34.0 pg   MCHC 32.1 30.0 - 36.0 g/dL   RDW 12.9 11.5 - 15.5 %  Platelets 125 (L) 150 - 400 K/uL  Lactic acid, plasma     Status: Abnormal   Collection Time: 10/29/16  4:19 PM  Result Value Ref Range   Lactic Acid, Venous 3.3 (HH) 0.5 - 1.9 mmol/L    Comment: CRITICAL RESULT CALLED TO, READ BACK BY AND VERIFIED WITH: Ferrel Logan RN [phone] GREEN R   Procalcitonin     Status: None   Collection Time: 10/29/16  4:19 PM  Result Value Ref Range   Procalcitonin 0.18 ng/mL    Comment:        Interpretation: PCT (Procalcitonin) <= 0.5 ng/mL: Systemic infection (sepsis) is not likely. Local bacterial infection is possible. (NOTE)         ICU PCT Algorithm               Non ICU PCT Algorithm    ----------------------------     ------------------------------         PCT < 0.25 ng/mL                 PCT < 0.1 ng/mL     Stopping of antibiotics            Stopping of antibiotics       strongly encouraged.               strongly encouraged.    ----------------------------     ------------------------------       PCT level decrease by               PCT < 0.25 ng/mL       >= 80% from peak PCT       OR PCT 0.25 - 0.5 ng/mL          Stopping of antibiotics                                              encouraged.     Stopping of antibiotics           encouraged.    ----------------------------     ------------------------------       PCT level decrease by              PCT >= 0.25 ng/mL       < 80% from peak PCT        AND PCT >= 0.5 ng/mL            Continuin g antibiotics                                              encouraged.       Continuing antibiotics            encouraged.    ----------------------------     ------------------------------     PCT level increase compared          PCT > 0.5 ng/mL         with peak PCT AND          PCT >= 0.5 ng/mL             Escalation of antibiotics  strongly encouraged.      Escalation of antibiotics        strongly encouraged.   Troponin I (q 6hr x 3)     Status: Abnormal   Collection Time: 10/29/16  4:19 PM  Result Value Ref Range   Troponin I 0.04 (HH) <0.03 ng/mL    Comment: CRITICAL RESULT CALLED TO, READ BACK BY AND VERIFIED WITH: Ferrel Logan RN [phone] GREEN R   Culture, blood (Routine X 2) w Reflex to ID Panel     Status: None (Preliminary result)   Collection Time: 10/29/16  4:30 PM  Result Value Ref Range   Specimen Description BLOOD RIGHT ANTECUBITAL    Special Requests      BOTTLES DRAWN AEROBIC AND ANAEROBIC Blood Culture adequate volume   Culture NO GROWTH < 24 HOURS    Report Status PENDING   Culture, blood (Routine X 2) w Reflex to ID Panel     Status: None (Preliminary result)   Collection Time: 10/29/16  4:38 PM  Result Value Ref Range   Specimen Description BLOOD RIGHT HAND    Special Requests      BOTTLES DRAWN AEROBIC ONLY Blood Culture adequate volume   Culture NO GROWTH < 24 HOURS    Report Status PENDING   CBG monitoring, ED     Status: Abnormal   Collection Time: 10/29/16  4:45 PM  Result Value Ref Range   Glucose-Capillary >600 (HH) 65 - 99 mg/dL  Glucose, capillary     Status: Abnormal   Collection Time: 10/29/16  6:12 PM  Result  Value Ref Range   Glucose-Capillary >600 (HH) 65 - 99 mg/dL  MRSA PCR Screening     Status: None   Collection Time: 10/29/16  6:14 PM  Result Value Ref Range   MRSA by PCR NEGATIVE NEGATIVE    Comment:        The GeneXpert MRSA Assay (FDA approved for NASAL specimens only), is one component of a comprehensive MRSA colonization surveillance program. It is not intended to diagnose MRSA infection nor to guide or monitor treatment for MRSA infections.   Basic metabolic panel     Status: Abnormal   Collection Time: 10/29/16  6:29 PM  Result Value Ref Range   Sodium 135 135 - 145 mmol/L   Potassium 3.3 (L) 3.5 - 5.1 mmol/L   Chloride 98 (L) 101 - 111 mmol/L   CO2 27 22 - 32 mmol/L   Glucose, Bld 801 (HH) 65 - 99 mg/dL    Comment: CRITICAL RESULT CALLED TO, READ BACK BY AND VERIFIED WITHTheotis Burrow RN [phone] 1933 GREEN R    BUN 73 (H) 6 - 20 mg/dL   Creatinine, Ser 3.94 (H) 0.44 - 1.00 mg/dL   Calcium 9.7 8.9 - 10.3 mg/dL   GFR calc non Af Amer 12 (L) >60 mL/min   GFR calc Af Amer 13 (L) >60 mL/min    Comment: (NOTE) The eGFR has been calculated using the CKD EPI equation. This calculation has not been validated in all clinical situations. eGFR's persistently <60 mL/min signify possible Chronic Kidney Disease.    Anion gap 10 5 - 15  Lactic acid, plasma     Status: Abnormal   Collection Time: 10/29/16  6:29 PM  Result Value Ref Range   Lactic Acid, Venous 3.3 (HH) 0.5 - 1.9 mmol/L    Comment: CRITICAL RESULT CALLED TO, READ BACK BY AND VERIFIED WITH: Theotis Burrow RN [phone] 1934 GREEN R  Glucose, capillary     Status: Abnormal   Collection Time: 10/29/16  7:20 PM  Result Value Ref Range   Glucose-Capillary >600 (HH) 65 - 99 mg/dL  Glucose, capillary     Status: Abnormal   Collection Time: 10/29/16  8:23 PM  Result Value Ref Range   Glucose-Capillary 484 (H) 65 - 99 mg/dL   Comment 1 Document in Chart   Glucose, capillary     Status: Abnormal   Collection Time: 10/29/16   9:24 PM  Result Value Ref Range   Glucose-Capillary 370 (H) 65 - 99 mg/dL   Comment 1 Document in Chart   Troponin I (q 6hr x 3)     Status: Abnormal   Collection Time: 10/29/16 10:02 PM  Result Value Ref Range   Troponin I 0.05 (HH) <0.03 ng/mL    Comment: CRITICAL VALUE NOTED.  VALUE IS CONSISTENT WITH PREVIOUSLY REPORTED AND CALLED VALUE.  Glucose, capillary     Status: Abnormal   Collection Time: 10/29/16 10:32 PM  Result Value Ref Range   Glucose-Capillary 192 (H) 65 - 99 mg/dL  Glucose, capillary     Status: None   Collection Time: 10/29/16 11:36 PM  Result Value Ref Range   Glucose-Capillary 93 65 - 99 mg/dL   Comment 1 Document in Chart   Glucose, capillary     Status: Abnormal   Collection Time: 10/30/16 12:40 AM  Result Value Ref Range   Glucose-Capillary 52 (L) 65 - 99 mg/dL   Comment 1 Document in Chart   Basic metabolic panel     Status: Abnormal   Collection Time: 10/30/16 12:41 AM  Result Value Ref Range   Sodium 143 135 - 145 mmol/L    Comment: DELTA CHECK NOTED   Potassium 2.6 (LL) 3.5 - 5.1 mmol/L    Comment: CRITICAL RESULT CALLED TO, READ BACK BY AND VERIFIED WITH: R.WILSON,RN 0138 10/30/16 G.MCADOO    Chloride 105 101 - 111 mmol/L   CO2 24 22 - 32 mmol/L   Glucose, Bld 143 (H) 65 - 99 mg/dL   BUN 85 (H) 6 - 20 mg/dL   Creatinine, Ser 4.06 (H) 0.44 - 1.00 mg/dL   Calcium 10.4 (H) 8.9 - 10.3 mg/dL   GFR calc non Af Amer 11 (L) >60 mL/min   GFR calc Af Amer 13 (L) >60 mL/min    Comment: (NOTE) The eGFR has been calculated using the CKD EPI equation. This calculation has not been validated in all clinical situations. eGFR's persistently <60 mL/min signify possible Chronic Kidney Disease.    Anion gap 14 5 - 15  Glucose, capillary     Status: None   Collection Time: 10/30/16  1:04 AM  Result Value Ref Range   Glucose-Capillary 98 65 - 99 mg/dL  Glucose, capillary     Status: Abnormal   Collection Time: 10/30/16  2:07 AM  Result Value Ref Range    Glucose-Capillary 52 (L) 65 - 99 mg/dL  Glucose, capillary     Status: None   Collection Time: 10/30/16  2:23 AM  Result Value Ref Range   Glucose-Capillary 97 65 - 99 mg/dL   Comment 1 Document in Chart   Glucose, capillary     Status: Abnormal   Collection Time: 10/30/16  3:36 AM  Result Value Ref Range   Glucose-Capillary 49 (L) 65 - 99 mg/dL   Comment 1 Document in Chart   Glucose, capillary     Status: None   Collection Time: 10/30/16  3:52 AM  Result Value Ref Range   Glucose-Capillary 96 65 - 99 mg/dL   Comment 1 Document in Chart   Troponin I (q 6hr x 3)     Status: Abnormal   Collection Time: 10/30/16  3:57 AM  Result Value Ref Range   Troponin I 0.08 (HH) <0.03 ng/mL    Comment: CRITICAL VALUE NOTED.  VALUE IS CONSISTENT WITH PREVIOUSLY REPORTED AND CALLED VALUE.  CBC     Status: Abnormal   Collection Time: 10/30/16  3:57 AM  Result Value Ref Range   WBC 20.4 (H) 4.0 - 10.5 K/uL   RBC 3.38 (L) 3.87 - 5.11 MIL/uL   Hemoglobin 10.2 (L) 12.0 - 15.0 g/dL   HCT 30.6 (L) 36.0 - 46.0 %   MCV 90.5 78.0 - 100.0 fL   MCH 30.2 26.0 - 34.0 pg   MCHC 33.3 30.0 - 36.0 g/dL   RDW 13.2 11.5 - 15.5 %   Platelets 133 (L) 150 - 400 K/uL  Basic metabolic panel     Status: Abnormal   Collection Time: 10/30/16  3:57 AM  Result Value Ref Range   Sodium 141 135 - 145 mmol/L   Potassium 3.2 (L) 3.5 - 5.1 mmol/L    Comment: NO VISIBLE HEMOLYSIS   Chloride 105 101 - 111 mmol/L   CO2 24 22 - 32 mmol/L   Glucose, Bld 89 65 - 99 mg/dL   BUN 92 (H) 6 - 20 mg/dL   Creatinine, Ser 4.11 (H) 0.44 - 1.00 mg/dL   Calcium 10.3 8.9 - 10.3 mg/dL   GFR calc non Af Amer 11 (L) >60 mL/min   GFR calc Af Amer 13 (L) >60 mL/min    Comment: (NOTE) The eGFR has been calculated using the CKD EPI equation. This calculation has not been validated in all clinical situations. eGFR's persistently <60 mL/min signify possible Chronic Kidney Disease.    Anion gap 12 5 - 15  Glucose, capillary     Status: None    Collection Time: 10/30/16  4:41 AM  Result Value Ref Range   Glucose-Capillary 71 65 - 99 mg/dL   Comment 1 Document in Chart   Glucose, capillary     Status: Abnormal   Collection Time: 10/30/16  7:35 AM  Result Value Ref Range   Glucose-Capillary 40 (LL) 65 - 99 mg/dL   Comment 1 Capillary Specimen    Comment 2 Notify RN   Glucose, capillary     Status: Abnormal   Collection Time: 10/30/16  8:22 AM  Result Value Ref Range   Glucose-Capillary 104 (H) 65 - 99 mg/dL   Comment 1 Capillary Specimen   Renal function panel     Status: Abnormal   Collection Time: 10/30/16  9:30 AM  Result Value Ref Range   Sodium 140 135 - 145 mmol/L   Potassium 3.4 (L) 3.5 - 5.1 mmol/L   Chloride 106 101 - 111 mmol/L   CO2 25 22 - 32 mmol/L   Glucose, Bld 106 (H) 65 - 99 mg/dL   BUN 101 (H) 6 - 20 mg/dL   Creatinine, Ser 4.25 (H) 0.44 - 1.00 mg/dL   Calcium 10.5 (H) 8.9 - 10.3 mg/dL   Phosphorus 1.9 (L) 2.5 - 4.6 mg/dL   Albumin 2.6 (L) 3.5 - 5.0 g/dL   GFR calc non Af Amer 11 (L) >60 mL/min   GFR calc Af Amer 12 (L) >60 mL/min    Comment: (NOTE) The eGFR has been calculated  using the CKD EPI equation. This calculation has not been validated in all clinical situations. eGFR's persistently <60 mL/min signify possible Chronic Kidney Disease.    Anion gap 9 5 - 15  CBC     Status: Abnormal   Collection Time: 10/30/16  9:30 AM  Result Value Ref Range   WBC 21.0 (H) 4.0 - 10.5 K/uL   RBC 3.29 (L) 3.87 - 5.11 MIL/uL   Hemoglobin 9.8 (L) 12.0 - 15.0 g/dL   HCT 28.9 (L) 36.0 - 46.0 %   MCV 87.8 78.0 - 100.0 fL   MCH 29.8 26.0 - 34.0 pg   MCHC 33.9 30.0 - 36.0 g/dL   RDW 12.9 11.5 - 15.5 %   Platelets 129 (L) 150 - 400 K/uL  Glucose, capillary     Status: Abnormal   Collection Time: 10/30/16  1:30 PM  Result Value Ref Range   Glucose-Capillary 121 (H) 65 - 99 mg/dL   Comment 1 Notify RN     Ct Abdomen Pelvis Wo Contrast  Result Date: 10/29/2016 CLINICAL DATA:  Seizures today. Vomiting.  History of diabetes, end-stage renal disease on dialysis, peptic ulcer disease. EXAM: CT ABDOMEN AND PELVIS WITHOUT CONTRAST TECHNIQUE: Multidetector CT imaging of the abdomen and pelvis was performed following the standard protocol without IV contrast. COMPARISON:  CT abdomen and pelvis February 07, 2017 FINDINGS: LOWER CHEST: Lung bases are clear. The heart size is mildly enlarged. Mild coronary artery calcifications. No pericardial effusion. HEPATOBILIARY: Normal. PANCREAS: Atrophic pancreas. SPLEEN: Normal. ADRENALS/URINARY TRACT: Kidneys are orthotopic, demonstrating normal size and morphology. No nephrolithiasis, hydronephrosis; limited assessment for renal masses on this nonenhanced examination. The unopacified ureters are normal in course and caliber. Urinary bladder is partially distended and unremarkable. Normal adrenal glands. STOMACH/BOWEL: Small hiatal hernia with fluid distended esophagus. Mild fluid distended stomach. Small air-filled gastric antral probable diverticulum. The small and large bowel are normal in course and caliber without inflammatory changes, sensitivity decreased by lack of enteric contrast. Normal appendix. VASCULAR/LYMPHATIC: Aortoiliac vessels are normal in course and caliber, moderate calcific atherosclerosis severe small vessel calcifications seen with end-stage renal disease. No lymphadenopathy by CT size criteria though sensitivity decreased without contrast. REPRODUCTIVE: Status post hysterectomy. OTHER: No intraperitoneal free fluid or free air. MUSCULOSKELETAL: Mild anasarca. 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 Small fat containing inguinal hernias and, umbilical hernia. Severe L5-S1 degenerative disc. Large L2 and L4 Schmorl's nodes. IMPRESSION: Fluid distended stomach may be transient or, sequelae of gastric paresis. No acute intra-abdominal or pelvic process by noncontrast CT. Mild anasarca. Electronically Signed   By: Elon Alas M.D.   On: 10/29/2016 14:59   Ct Head Wo Contrast  Result Date: 10/29/2016 CLINICAL DATA:  61 year old female with seizure today. EXAM: CT HEAD WITHOUT CONTRAST TECHNIQUE: Contiguous axial images were obtained from the base of the skull through the vertex without intravenous contrast. COMPARISON:  02/09/2016 head CT and prior studies FINDINGS: Brain: No evidence of acute infarction, hemorrhage, hydrocephalus, extra-axial collection or mass lesion/mass effect. Mild chronic small-vessel white matter ischemic changes again noted. Vascular: Intracranial atherosclerotic calcifications identified. Skull: Normal. Negative for fracture or focal lesion. Sinuses/Orbits: No acute finding. Other: None. IMPRESSION: No evidence of acute intracranial abnormality. Mild chronic small-vessel white matter ischemic changes. Electronically Signed   By: Margarette Canada M.D.   On: 10/29/2016 14:55    ROS negative except above Blood pressure 120/64, pulse 93, temperature 98.6 F (37 C), temperature source Axillary, resp. rate 19, height 4' 9"  (1.448  m), weight 59.5 kg (131 lb 2.8 oz), SpO2 100 %. Physical Exam patient not really responding to me occasional wheeze lying comfortably in the bed decreased heart sounds abdomen is minimal tenderness in the midepigastric area no obvious splash rare bowel sound no guarding or rebound CT from yesterday reviewed awaiting follow-up H&H increased BUN  Assessment/Plan:  hematemesis with what seems like old blood Plan: We'll keep nothing by mouth follow H&H and stools and consider endoscopy if need to know for signs of continual bleeding otherwise consider endoscopy when stable or if signs of active bleeding will have to decide if we should try with or without intubation and will follow with you and will put on a Protonix drip for now  [facility] E 10/30/2016, 2:43 PM

## 2016-10-30 NOTE — Significant Event (Signed)
Rapid Response Event Note  Overview: Time Called: 1151 Arrival Time: 1152 Event Type: Other (Comment) (GI bleed)  Initial Focused Assessment: Patient with large amount of black stool and emesis. Patient moving spontaneously but not interacting with staff. BP 84/69  HR 115  RR 30  O2 sat 100% Lung sounds clear Heart tones regular  Interventions: 750 cc NS bolus Stopped HD treatment BP 120-130s/60s  HR 98-110  RR 32  O2 sat 100%  Patient returned to 2M07  Plan of Care (if not transferred):  Event Summary: Name of Physician Notified: Dr Molli KnockYacoub at 1151    at    Outcome: Stayed in room and stabalized (ICU patient in HD)     Marcellina MillinLayton, Haila Dena

## 2016-10-30 NOTE — Progress Notes (Signed)
Subjective:  Tmax 99.2- BP variable - sugar down- restrained - not extremely responsive Objective Vital signs in last 24 hours: Vitals:   10/30/16 0630 10/30/16 0645 10/30/16 0700 10/30/16 0733  BP: (!) 157/50 (!) 158/56 (!) 163/54   Pulse: 86 80 80   Resp: (!) 29 (!) 23 20   Temp:    97 F (36.1 C)  TempSrc:    Oral  SpO2: 99% 100% 100%   Weight:      Height:       Weight change:   Intake/Output Summary (Last 24 hours) at 10/30/16 0813 Last data filed at 10/30/16 0600  Gross per 24 hour  Intake          1308.01 ml  Output                0 ml  Net          1308.01 ml   Dialysis Orders: [facility] T,Th,S 4 hrs 400/Auto 1.5 57.5 kg 3.0 K/2.25 Ca UF Profile 4 -Heparin: 4000 units IV TIW -Hectorol 6 mcg IV TIW (last PTH 389 10/01/16) -Mircera 50 mcg IV q 4 weeks (last dose 10/08/2016 Next dose due 11/05/16 Last HGB 10.5 10/22/16)  Assessment/Plan: 1.  HHNK/Hyperglycemia: BS > 700. Insulin gtt per primary- sugar  is now down  2.  AMS: CT of head negative. Likely metabolic issue- also high BPs and sedating meds could be contrib- at baseline very simple but alert 3.  Abd pain/N & V: CT of Ab/pelvis negative. Per primary 4.  ESRD - T,Th,S as OP via left AVG. Will have HD today off schedule. 5.  Hypertension/volume  - BP labile-started cardene gtt-now Dc'd D/T labile BP. No evidence of volume overload on CT of abdomen or exam. No wt available yet. UFG to OP EDW 57.5 kg. Supposedly on amlodipine and coreg max dose as OP- not taking POs right now- PRN ivs 6.  Anemia  - HGB 10.2 follow HGB. Last mircera 5/31 7.  Metabolic bone disease -  Continue VDRA/Sensipar when eating again.  8.  Nutrition - NPO at present.  9. Hypokalemia- given repletion this AM- will run on high K bath  10. Elevated WBC- ? etiol- per primary- no abx   [facility]eline [name] A    Labs: Basic Metabolic Panel:  Recent Labs Lab 10/29/16 1829 10/30/16 0041 10/30/16 0357  NA 135 143 141  K 3.3* 2.6* 3.2*  CL 98*  105 105  CO2 27 24 24   GLUCOSE 801* 143* 89  BUN 73* 85* 92*  CREATININE 3.94* 4.06* 4.11*  CALCIUM 9.7 10.4* 10.3   Liver Function Tests:  Recent Labs Lab 10/29/16 1220  AST 17  ALT 10*  ALKPHOS 116  BILITOT 0.7  PROT 5.9*  ALBUMIN 3.1*    Recent Labs Lab 10/29/16 1220  LIPASE 22   No results for input(s): AMMONIA in the last 168 hours. CBC:  Recent Labs Lab 10/29/16 1220 10/29/16 1238 10/29/16 1619 10/30/16 0357  WBC 12.4*  --  13.6* 20.4*  NEUTROABS 11.0*  --   --   --   HGB 10.8* 11.6* 10.0* 10.2*  HCT 34.6* 34.0* 31.2* 30.6*  MCV 94.0  --  92.6 90.5  PLT 122*  --  125* 133*   Cardiac Enzymes:  Recent Labs Lab 10/29/16 1619 10/29/16 2202 10/30/16 0357  TROPONINI 0.04* 0.05* 0.08*   CBG:  Recent Labs Lab 10/30/16 0223 10/30/16 0336 10/30/16 0352 10/30/16 0441 10/30/16 0735  GLUCAP 97 49* 96  71 40*    Iron Studies: No results for input(s): IRON, TIBC, TRANSFERRIN, FERRITIN in the last 72 hours. Studies/Results: Ct Abdomen Pelvis Wo Contrast  Result Date: 10/29/2016 CLINICAL DATA:  Seizures today. Vomiting. History of diabetes, end-stage renal disease on dialysis, peptic ulcer disease. EXAM: CT ABDOMEN AND PELVIS WITHOUT CONTRAST TECHNIQUE: Multidetector CT imaging of the abdomen and pelvis was performed following the standard protocol without IV contrast. COMPARISON:  CT abdomen and pelvis February 07, 2017 FINDINGS: LOWER CHEST: Lung bases are clear. The heart size is mildly enlarged. Mild coronary artery calcifications. No pericardial effusion. HEPATOBILIARY: Normal. PANCREAS: Atrophic pancreas. SPLEEN: Normal. ADRENALS/URINARY TRACT: Kidneys are orthotopic, demonstrating normal size and morphology. No nephrolithiasis, hydronephrosis; limited assessment for renal masses on this nonenhanced examination. The unopacified ureters are normal in course and caliber. Urinary bladder is partially distended and unremarkable. Normal adrenal glands.  STOMACH/BOWEL: Small hiatal hernia with fluid distended esophagus. Mild fluid distended stomach. Small air-filled gastric antral probable diverticulum. The small and large bowel are normal in course and caliber without inflammatory changes, sensitivity decreased by lack of enteric contrast. Normal appendix. VASCULAR/LYMPHATIC: Aortoiliac vessels are normal in course and caliber, moderate calcific atherosclerosis severe small vessel calcifications seen with end-stage renal disease. No lymphadenopathy by CT size criteria though sensitivity decreased without contrast. REPRODUCTIVE: Status post hysterectomy. OTHER: No intraperitoneal free fluid or free air. MUSCULOSKELETAL: Mild anasarca. 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 Small fat containing inguinal hernias and, umbilical hernia. Severe L5-S1 degenerative disc. Large L2 and L4 Schmorl's nodes. IMPRESSION: Fluid distended stomach may be transient or, sequelae of gastric paresis. No acute intra-abdominal or pelvic process by noncontrast CT. Mild anasarca. Electronically Signed   By: Awilda Metro M.D.   On: 10/29/2016 14:59   Ct Head Wo Contrast  Result Date: 10/29/2016 CLINICAL DATA:  61 year old female with seizure today. EXAM: CT HEAD WITHOUT CONTRAST TECHNIQUE: Contiguous axial images were obtained from the base of the skull through the vertex without intravenous contrast. COMPARISON:  02/09/2016 head CT and prior studies FINDINGS: Brain: No evidence of acute infarction, hemorrhage, hydrocephalus, extra-axial collection or mass lesion/mass effect. Mild chronic small-vessel white matter ischemic changes again noted. Vascular: Intracranial atherosclerotic calcifications identified. Skull: Normal. Negative for fracture or focal lesion. Sinuses/Orbits: No acute finding. Other: None. IMPRESSION: No evidence of acute intracranial abnormality. Mild chronic small-vessel white matter ischemic changes. Electronically Signed    By: Harmon Pier M.D.   On: 10/29/2016 14:55   Medications: Infusions: . dextrose 5 % and 0.45% NaCl 10 mL/hr at 10/30/16 0421  . niCARDipine      Scheduled Medications: . chlorhexidine  15 mL Mouth Rinse BID  . doxercalciferol  6 mcg Intravenous Q T,Th,Sa-HD  . heparin  5,000 Units Subcutaneous Q8H  . insulin aspart  0-9 Units Subcutaneous Q4H  . mouth rinse  15 mL Mouth Rinse q12n4p    have reviewed scheduled and prn medications.  Physical Exam: General: restraints, not very arousable Heart: RRR Lungs: mostly clear Abdomen: soft, non tender, non distended Extremities: minimal edema Dialysis Access: left upper arm AVG     10/30/2016,8:13 AM  LOS: 1 day

## 2016-10-30 NOTE — Procedures (Signed)
Patient was seen on dialysis and the procedure was supervised.  BFR 400  Via AVG BP is  129/52.   Patient appears to be tolerating treatment well  Madeline [name] A 10/30/2016

## 2016-10-31 DIAGNOSIS — G9340 Encephalopathy, unspecified: Secondary | ICD-10-CM

## 2016-10-31 DIAGNOSIS — E1100 Type 2 diabetes mellitus with hyperosmolarity without nonketotic hyperglycemic-hyperosmolar coma (NKHHC): Secondary | ICD-10-CM

## 2016-10-31 DIAGNOSIS — I161 Hypertensive emergency: Secondary | ICD-10-CM

## 2016-10-31 LAB — BASIC METABOLIC PANEL
Anion gap: 11 (ref 5–15)
BUN: 43 mg/dL — AB (ref 6–20)
CO2: 23 mmol/L (ref 22–32)
Calcium: 9.3 mg/dL (ref 8.9–10.3)
Chloride: 104 mmol/L (ref 101–111)
Creatinine, Ser: 3.34 mg/dL — ABNORMAL HIGH (ref 0.44–1.00)
GFR calc Af Amer: 16 mL/min — ABNORMAL LOW (ref >60)
GFR, EST NON AFRICAN AMERICAN: 14 mL/min — AB (ref >60)
GLUCOSE: 197 mg/dL — AB (ref 65–99)
POTASSIUM: 3.7 mmol/L (ref 3.5–5.1)
Sodium: 138 mmol/L (ref 135–145)

## 2016-10-31 LAB — HEMOGLOBIN AND HEMATOCRIT, BLOOD
HCT: 26.8 % — ABNORMAL LOW (ref 36.0–46.0)
HEMATOCRIT: 22 % — AB (ref 36.0–46.0)
HEMATOCRIT: 22.6 % — AB (ref 36.0–46.0)
HEMOGLOBIN: 7.1 g/dL — AB (ref 12.0–15.0)
HEMOGLOBIN: 7.4 g/dL — AB (ref 12.0–15.0)
Hemoglobin: 8.6 g/dL — ABNORMAL LOW (ref 12.0–15.0)

## 2016-10-31 LAB — MAGNESIUM: Magnesium: 1.8 mg/dL (ref 1.7–2.4)

## 2016-10-31 LAB — PHOSPHORUS: PHOSPHORUS: 2.4 mg/dL — AB (ref 2.5–4.6)

## 2016-10-31 LAB — CBC
HEMATOCRIT: 24.3 % — AB (ref 36.0–46.0)
Hemoglobin: 8 g/dL — ABNORMAL LOW (ref 12.0–15.0)
MCH: 30.1 pg (ref 26.0–34.0)
MCHC: 32.9 g/dL (ref 30.0–36.0)
MCV: 91.4 fL (ref 78.0–100.0)
Platelets: 97 10*3/uL — ABNORMAL LOW (ref 150–400)
RBC: 2.66 MIL/uL — ABNORMAL LOW (ref 3.87–5.11)
RDW: 13.6 % (ref 11.5–15.5)
WBC: 14.3 10*3/uL — ABNORMAL HIGH (ref 4.0–10.5)

## 2016-10-31 LAB — GLUCOSE, CAPILLARY
GLUCOSE-CAPILLARY: 172 mg/dL — AB (ref 65–99)
GLUCOSE-CAPILLARY: 136 mg/dL — AB (ref 65–99)
Glucose-Capillary: 188 mg/dL — ABNORMAL HIGH (ref 65–99)
Glucose-Capillary: 172 mg/dL — ABNORMAL HIGH (ref 65–99)
Glucose-Capillary: 158 mg/dL — ABNORMAL HIGH (ref 65–99)

## 2016-10-31 MED ORDER — SIMETHICONE 40 MG/0.6ML PO SUSP: 40.0000 mg | Freq: Four times a day (QID) | ORAL | Status: DC

## 2016-10-31 MED ORDER — SIMETHICONE 80 MG PO CHEW
40.0000 mg | CHEWABLE_TABLET | Freq: Four times a day (QID) | ORAL | Status: DC
  Administered 2016-10-31 – 2016-11-01 (×3): 40 mg via ORAL
  Filled 2016-10-31 (×5): qty 1

## 2016-10-31 MED ORDER — WHITE PETROLATUM GEL
Status: AC
  Administered 2016-10-31: 20:00:00
  Filled 2016-10-31: qty 1

## 2016-10-31 MED ORDER — MAGNESIUM SULFATE 2 GM/50ML IV SOLN
2.0000 g | Freq: Once | INTRAVENOUS | Status: AC
  Administered 2016-10-31: 2 g via INTRAVENOUS
  Filled 2016-10-31: qty 50

## 2016-10-31 MED ORDER — DARBEPOETIN ALFA 100 MCG/0.5ML IJ SOSY
100.0000 ug | PREFILLED_SYRINGE | INTRAMUSCULAR | Status: DC
  Administered 2016-11-02: 100 ug via INTRAVENOUS
  Filled 2016-10-31: qty 0.5

## 2016-10-31 NOTE — Progress Notes (Signed)
Subjective:  Had episode of emesis of black material approx 2 hours into HD treatment yesterday (did get 4000 of heparin) - tx was stopped prematurely- hemodynamically stable- on protonix drip- low grade temp overnight   Objective Vital signs in last 24 hours: Vitals:   10/31/16 0200 10/31/16 0332 10/31/16 0346 10/31/16 0510  BP: 100/74   119/72  Pulse: 61   88  Resp: (!) 26   (!) 30  Temp:  99.1 F (37.3 C)    TempSrc:  Oral    SpO2: 100%   100%  Weight:   57.3 kg (126 lb 5.2 oz)   Height:       Weight change: -3.95 kg (-8 lb 11.3 oz)  Intake/Output Summary (Last 24 hours) at 10/31/16 0658 Last data filed at 10/31/16 0510  Gross per 24 hour  Intake           595.82 ml  Output              400 ml  Net           195.82 ml   Dialysis Orders: [facility] T,Th,S 4 hrs 400/Auto 1.5 57.5 kg 3.0 K/2.25 Ca UF Profile 4 -Heparin: 4000 units IV TIW -Hectorol 6 mcg IV TIW (last PTH 389 10/01/16) -Mircera 50 mcg IV q 4 weeks (last dose 10/08/2016 Next dose due 11/05/16 Last HGB 10.5 10/22/16)  Assessment/Plan: 1.  HHNK/Hyperglycemia: BS > 700. Insulin gtt per primary- sugar  is now down  2.  AMS: CT of head negative. Likely metabolic issue- also high BPs and sedating meds could be contrib- at baseline very simple but alert 3.  Abd pain/N & V: CT of Ab/pelvis negative. Per primary 4.  ESRD - T,Th,S as OP via left AVG. Had HD yest 6/22 off schedule- will likely do Monday again off schedule. No needs today  5.  Hypertension/volume  - BP labile- previous cardene gtt-now Dc'd. No evidence of volume overload on CT of abdomen or exam.UFG to OP EDW 57.5 kg. Supposedly on amlodipine and coreg max dose as OP- not taking POs right now- PRN ivs- removed 400 yest 6.  Anemia  - HGB 10.2 6/22-Last mircera 5/31- had acute GIB yest it appears, hgb down to 8 this AM- on protonix drip- will give darbe today  7.  Metabolic bone disease -  Continue VDRA/Sensipar when eating again.  8.  Nutrition - NPO at present.   9. Hypokalemia- given repletion 6/22- ran on high K bath  10. Elevated WBC- ? etiol- per primary- no abx - trending down  Richele Strand A    Labs: Basic Metabolic Panel:  Recent Labs Lab 10/30/16 0357 10/30/16 0930 10/31/16 0331  NA 141 140 138  K 3.2* 3.4* 3.7  CL 105 106 104  CO2 24 25 23   GLUCOSE 89 106* 197*  BUN 92* 101* 43*  CREATININE 4.11* 4.25* 3.34*  CALCIUM 10.3 10.5* 9.3  PHOS  --  1.9* 2.4*   Liver Function Tests:  Recent Labs Lab 10/29/16 1220 10/30/16 0930  AST 17  --   ALT 10*  --   ALKPHOS 116  --   BILITOT 0.7  --   PROT 5.9*  --   ALBUMIN 3.1* 2.6*    Recent Labs Lab 10/29/16 1220  LIPASE 22    Recent Labs Lab 10/30/16 1526  AMMONIA 11   CBC:  Recent Labs Lab 10/29/16 1220  10/29/16 1619 10/30/16 0357 10/30/16 0930 10/30/16 1526 10/30/16 2052 10/31/16 0331  WBC 12.4*  --  13.6* 20.4* 21.0*  --   --  14.3*  NEUTROABS 11.0*  --   --   --   --   --   --   --   HGB 10.8*  < > 10.0* 10.2* 9.8* 9.1* 8.4* 8.0*  HCT 34.6*  < > 31.2* 30.6* 28.9* 27.1* 25.4* 24.3*  MCV 94.0  --  92.6 90.5 87.8  --   --  91.4  PLT 122*  --  125* 133* 129*  --   --  97*  < > = values in this interval not displayed. Cardiac Enzymes:  Recent Labs Lab 10/29/16 1619 10/29/16 2202 10/30/16 0357  TROPONINI 0.04* 0.05* 0.08*   CBG:  Recent Labs Lab 10/30/16 1330 10/30/16 1547 10/30/16 2025 10/30/16 2330 10/31/16 0331  GLUCAP 121* 169* 183* 204* 172*    Iron Studies: No results for input(s): IRON, TIBC, TRANSFERRIN, FERRITIN in the last 72 hours. Studies/Results: Ct Abdomen Pelvis Wo Contrast  Result Date: 10/29/2016 CLINICAL DATA:  Seizures today. Vomiting. History of diabetes, end-stage renal disease on dialysis, peptic ulcer disease. EXAM: CT ABDOMEN AND PELVIS WITHOUT CONTRAST TECHNIQUE: Multidetector CT imaging of the abdomen and pelvis was performed following the standard protocol without IV contrast. COMPARISON:  CT abdomen and  pelvis February 07, 2017 FINDINGS: LOWER CHEST: Lung bases are clear. The heart size is mildly enlarged. Mild coronary artery calcifications. No pericardial effusion. HEPATOBILIARY: Normal. PANCREAS: Atrophic pancreas. SPLEEN: Normal. ADRENALS/URINARY TRACT: Kidneys are orthotopic, demonstrating normal size and morphology. No nephrolithiasis, hydronephrosis; limited assessment for renal masses on this nonenhanced examination. The unopacified ureters are normal in course and caliber. Urinary bladder is partially distended and unremarkable. Normal adrenal glands. STOMACH/BOWEL: Small hiatal hernia with fluid distended esophagus. Mild fluid distended stomach. Small air-filled gastric antral probable diverticulum. The small and large bowel are normal in course and caliber without inflammatory changes, sensitivity decreased by lack of enteric contrast. Normal appendix. VASCULAR/LYMPHATIC: Aortoiliac vessels are normal in course and caliber, moderate calcific atherosclerosis severe small vessel calcifications seen with end-stage renal disease. No lymphadenopathy by CT size criteria though sensitivity decreased without contrast. REPRODUCTIVE: Status post hysterectomy. OTHER: No intraperitoneal free fluid or free air. MUSCULOSKELETAL: Mild anasarca. 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 Small fat containing inguinal hernias and, umbilical hernia. Severe L5-S1 degenerative disc. Large L2 and L4 Schmorl's nodes. IMPRESSION: Fluid distended stomach may be transient or, sequelae of gastric paresis. No acute intra-abdominal or pelvic process by noncontrast CT. Mild anasarca. Electronically Signed   By: Awilda Metro M.D.   On: 10/29/2016 14:59   Ct Head Wo Contrast  Result Date: 10/29/2016 CLINICAL DATA:  61 year old female with seizure today. EXAM: CT HEAD WITHOUT CONTRAST TECHNIQUE: Contiguous axial images were obtained from the base of the skull through the vertex without  intravenous contrast. COMPARISON:  02/09/2016 head CT and prior studies FINDINGS: Brain: No evidence of acute infarction, hemorrhage, hydrocephalus, extra-axial collection or mass lesion/mass effect. Mild chronic small-vessel white matter ischemic changes again noted. Vascular: Intracranial atherosclerotic calcifications identified. Skull: Normal. Negative for fracture or focal lesion. Sinuses/Orbits: No acute finding. Other: None. IMPRESSION: No evidence of acute intracranial abnormality. Mild chronic small-vessel white matter ischemic changes. Electronically Signed   By: Harmon Pier M.D.   On: 10/29/2016 14:55   Dg Chest Port 1 View  Result Date: 10/30/2016 CLINICAL DATA:  Central line placement.  Initial encounter. EXAM: PORTABLE CHEST 1 VIEW COMPARISON:  Chest radiograph performed 02/09/2016 FINDINGS: A left  IJ line is noted ending about the mid to distal SVC. The lungs appear clear bilaterally. No focal consolidation, pleural effusion or pneumothorax is seen. The cardiomediastinal silhouette is borderline normal in size. No acute osseous abnormalities are identified. IMPRESSION: 1. Left IJ line noted ending about the mid to distal SVC. 2. No acute cardiopulmonary process seen. Electronically Signed   By: Roanna RaiderJeffery  Chang M.D.   On: 10/30/2016 19:39   Medications: Infusions: . dextrose 5 % and 0.45% NaCl Stopped (10/31/16 0510)  . niCARDipine    . pantoprozole (PROTONIX) infusion 8 mg/hr (10/31/16 0332)    Scheduled Medications: . chlorhexidine  15 mL Mouth Rinse BID  . Chlorhexidine Gluconate Cloth  6 each Topical Daily  . doxercalciferol  6 mcg Intravenous Q T,Th,Sa-HD  . insulin aspart  0-9 Units Subcutaneous Q4H  . mouth rinse  15 mL Mouth Rinse q12n4p  . [START ON 11/03/2016] pantoprazole  40 mg Intravenous Q12H  . sodium chloride flush  10-40 mL Intracatheter Q12H  . sodium chloride flush  10-40 mL Intracatheter Q12H    have reviewed scheduled and prn medications.  Physical  Exam: General: more alert, recognizes me "my stomach hurts" Heart: RRR Lungs: mostly clear Abdomen: soft, non tender, non distended Extremities: minimal edema Dialysis Access: left upper arm AVG     10/31/2016,6:58 AM  LOS: 2 days

## 2016-10-31 NOTE — Progress Notes (Signed)
Inpatient Diabetes Program Recommendations  AACE/ADA: New Consensus Statement on Inpatient Glycemic Control (2015)  Target Ranges:  Prepandial:   less than 140 mg/dL      Peak postprandial:   less than 180 mg/dL (1-2 hours)      Critically ill patients:  140 - 180 mg/dL   Lab Results  Component Value Date   GLUCAP 188 (H) 10/31/2016   HGBA1C 11.7 (H) 02/08/2016    Review of Glycemic ControlResults for [name], Madeline [name] (MRN [id number]) as of 10/31/2016 08:57  Ref. Range 10/30/2016 20:25 10/30/2016 23:30 10/31/2016 03:31 10/31/2016 08:20  Glucose-Capillary Latest Ref Range: 65 - 99 mg/dL 409183 (H) 811204 (H) 914172 (H) 188 (H)    Diabetes history: DM Outpatient Diabetes medications: Lantus 17 units q HS, Humalog 3-10 units tid with meals Current orders for Inpatient glycemic control:  Novolog sensitive q 4 hours  Inpatient Diabetes Program Recommendations:    Consider adding Lantus 6 units daily.  Thanks, Beryl MeagerJenny Gerri Acre, RN, BC-ADM Inpatient Diabetes Coordinator Pager [phone] (8a-5p)

## 2016-10-31 NOTE — Progress Notes (Signed)
Subjective: Minimally interactive. No reported further black emesis.  Objective: Vital signs in last 24 hours: Temp:  [98.3 F (36.8 C)-100.4 F (38 C)] 98.9 F (37.2 C) (06/23 0821) Pulse Rate:  [61-105] 90 (06/23 0900) Resp:  [15-38] 15 (06/23 0900) BP: (96-203)/(48-174) 116/74 (06/23 0900) SpO2:  [98 %-100 %] 99 % (06/23 0900) Weight:  [57.3 kg (126 lb 5.2 oz)-59.5 kg (131 lb 2.8 oz)] 57.3 kg (126 lb 5.2 oz) (06/23 0346) Weight change: -3.95 kg (-8 lb 11.3 oz) Last BM Date: 10/30/16  PE: GEN:  Minimally interactive  Lab Results: CBC    Component Value Date/Time   WBC 14.3 (H) 10/31/2016 0331   RBC 2.66 (L) 10/31/2016 0331   HGB 8.6 (L) 10/31/2016 0638   HCT 26.8 (L) 10/31/2016 0638   HCT 33.1 (L) 10/24/2014 1019   PLT 97 (L) 10/31/2016 0331   MCV 91.4 10/31/2016 0331   MCH 30.1 10/31/2016 0331   MCHC 32.9 10/31/2016 0331   RDW 13.6 10/31/2016 0331   LYMPHSABS 0.6 (L) 10/29/2016 1220   MONOABS 0.8 10/29/2016 1220   EOSABS 0.0 10/29/2016 1220   BASOSABS 0.0 10/29/2016 1220   CMP     Component Value Date/Time   NA 138 10/31/2016 0331   K 3.7 10/31/2016 0331   CL 104 10/31/2016 0331   CO2 23 10/31/2016 0331   GLUCOSE 197 (H) 10/31/2016 0331   BUN 43 (H) 10/31/2016 0331   CREATININE 3.34 (H) 10/31/2016 0331   CREATININE 3.12 (H) 02/04/2016 1340   CALCIUM 9.3 10/31/2016 0331   CALCIUM 8.6 12/12/2013 0920   PROT 5.9 (L) 10/29/2016 1220   ALBUMIN 2.6 (L) 10/30/2016 0930   AST 17 10/29/2016 1220   ALT 10 (L) 10/29/2016 1220   ALT 18 02/04/2016 1340   ALKPHOS 116 10/29/2016 1220   BILITOT 0.7 10/29/2016 1220   GFRNONAA 14 (L) 10/31/2016 0331   GFRAA 16 (L) 10/31/2016 0331   Assessment:  1.  Altered mental status; minimally interactive. 2.  Black emesis; no further recurrences overnight by report.  Plan:  1.  Would not consider endoscopy unless patient has overt destabilizing upper GI tract bleeding, which she currently does not have. 2.  Can continue  PPI gtt x 24 hours, then transition to IV q 12 hours thereafter until patient is able to take po medication. 3.  [facility] will sign-off.   Freddy JakschUTLAW,Ashante Yellin M 10/31/2016, 10:30 AM

## 2016-10-31 NOTE — Evaluation (Signed)
Clinical/Bedside Swallow Evaluation Patient Details  Name: Madeline [name] MRN: [id number] Date of Birth: Sep 12, 1955  Today's Date: 10/31/2016 Time: SLP Start Time (ACUTE ONLY): 1122 SLP Stop Time (ACUTE ONLY): 1137 SLP Time Calculation (min) (ACUTE ONLY): 15 min  Past Medical History:  Past Medical History:  Diagnosis Date  . Abnormal Doppler ultrasound of carotid artery    a. Per [facility] [name] records: <50% LICA.  Marland Kitchen Anasarca    a. Per [facility] [name] records - due to pulm HTN with R HF.   Marland Kitchen Aseptic meningitis    a. 09/2012: adm in [place] for metabolic encephalopathy, oliguric tubular necrosis, anemia, HTN, possible CVA, HHNKA.  Marland Kitchen CHF (congestive heart failure) (HCC)    a. HFpEF with RHF/anasarca/pHTN.  . CKD (chronic kidney disease), stage III    a. Per [facility]eline [name] records: h/o ARF after CTA that ruled out PE.  Marland Kitchen COPD (chronic obstructive pulmonary disease) (HCC)   . Coronary artery disease    a. Per [facility] [name] records: NSTEMI 01/2012, tx medically given ARF but suspected CAD. b. Stress test 12/16/11 reported w/o ischemia.  Marland Kitchen CVA (cerebral infarction)    a. Per [facility] [name] records, "possible CVA" 09/2012 but MRI reportedly negative.  . Diabetes mellitus (HCC)   . Dialysis patient [name])    Mon, Wed, Fridays  . Hypertension   . OSA (obstructive sleep apnea)    a. Pt reported used to use CPAP in , but "ran out" when came to [name].  Marland Kitchen Pulmonary hypertension (HCC)    a. RHC 02/28/13: mod pulm HTN with normal PVR suggestive of predominantly pulmonary venous HTN.  Marland Kitchen Renal insufficiency    dialysis 3 days/week  . Right heart failure (HCC)   . Stroke [name]) 2014   Past Madeline History:  Past Madeline History:  Procedure Laterality Date  . ABDOMINAL HYSTERECTOMY    . AV FISTULA PLACEMENT Left 03/03/2013   Procedure: ARTERIOVENOUS (AV) FISTULA CREATION, Brachial/Cephalic;  Surgeon: Madeline Earthly, MD;  Location: [name];  Service: Vascular;  Laterality: Left;  . ESOPHAGOGASTRODUODENOSCOPY N/A 02/16/2013   Procedure: ESOPHAGOGASTRODUODENOSCOPY  (EGD);  Surgeon: Madeline Shiver, MD;  Location: [name];  Service: [name];  Laterality: N/A;  . INSERTION OF DIALYSIS CATHETER Right 03/03/2013   Procedure: INSERTION OF DIALYSIS CATHETER;  Surgeon: Madeline Earthly, MD;  Location: [place] [name];  Service: Vascular;  Laterality: Right;  Right Internal Jugular Placement  . RIGHT HEART CATHETERIZATION N/A 02/28/2013   Procedure: RIGHT HEART CATH;  Surgeon: Madeline Patty, MD;  Location: [facility] CATH LAB;  Service: Cardiovascular;  Laterality: N/A;  . SHUNTOGRAM N/A 07/13/2013   Procedure: FISTULOGRAM;  Surgeon: Madeline Hertz, MD;  Location: [facility] CATH LAB;  Service: Cardiovascular;  Laterality: N/A;  . TUBAL LIGATION     HPI:  61yo female with hx DM, ESRD, HTN presenting with HTN crisis, HHNK, AMS. Pt vomited dark emesis and dark BM in HD. 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 No further w/u at this time from GI.     Assessment / Plan / Recommendation Clinical Impression  Pt demonstrates normal swallow function, no signs of aspiration [name] difficulty. Recommend pt consume a regular texture diet and thin liquids. No SLP f/u needed will sign off.  SLP Visit Diagnosis: Dysphagia, unspecified (R13.10)    Aspiration Risk  Mild aspiration risk    Diet Recommendation Regular;Thin liquid   Liquid Administration via: Cup;Straw Medication Administration: Whole meds with liquid Supervision: Patient able to self feed    Other  Recommendations  Follow up Recommendations        Frequency and Duration            Prognosis        Swallow Study   General HPI: 61yo female with hx DM, ESRD, HTN presenting with HTN crisis, HHNK, AMS. Pt vomited dark emesis and dark BM in HD. 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 No further w/u at this time from GI.   Type of Study: Bedside Swallow Evaluation Diet Prior to this Study: Regular;Thin liquids Temperature Spikes Noted: No Respiratory Status: Nasal cannula History of Recent  Intubation: No Behavior/Cognition: Alert Oral Cavity Assessment: Within Functional Limits Oral Care Completed by SLP: No Oral Cavity - Dentition: Adequate natural dentition Vision: Functional for self-feeding Self-Feeding Abilities: Able to feed self Patient Positioning: Upright in bed;Partially reclined Baseline Vocal Quality: Normal Volitional Cough: Strong Volitional Swallow: Able to elicit    Oral/Motor/Sensory Function Overall Oral Motor/Sensory Function: Within functional limits   Ice Chips     Thin Liquid Thin Liquid: Within functional limits Presentation: Cup;Straw    Nectar Thick Nectar Thick Liquid: Not tested   Honey Thick Honey Thick Liquid: Not tested   Puree Puree: Within functional limits   Solid   GO   Solid: Within functional limits       [facility] - EastBonnie [name], MA CCC-SLP [phone]  Madeline [name], Madeline [name] Madeline [name] 10/31/2016,11:40 AM

## 2016-10-31 NOTE — Progress Notes (Signed)
PULMONARY / CRITICAL CARE MEDICINE   Name: Madeline [name] MRN: [id number] DOB: Sep 06, 1955    ADMISSION DATE:  10/29/2016  REFERRING MD:  EDP   CHIEF COMPLAINT:  AMS, HTN, HHNK   HISTORY OF PRESENT ILLNESS:   61yo female with hx ESRD, HTN, OSA, pulmonary hypertension, CHF who presented 6/21 with c/o abd pain, n/v and AMS.   In ER she had brief seizure-like episode which improved with Ativan.  W/u was also notable for glucose >700, BP 238/97, wbc 12.4, Scr 3.4.  She was started on cardene gtt and PCCM called for ICU admission.   SUBJECTIVE:  No events overnight, no further hemoptysis.  VITAL SIGNS: BP 116/74   Pulse 90   Temp 98.9 F (37.2 C) (Oral)   Resp 15   Ht 4\' 9"  (1.448 m)   Wt 126 lb 5.2 oz (57.3 kg)   SpO2 99%   BMI 27.34 kg/m   HEMODYNAMICS:    VENTILATOR SETTINGS:    INTAKE / OUTPUT: I/O last 3 completed shifts: In: 1620.6 [I.V.:1120.6; IV Piggyback:500] Out: 400 [Other:400]  PHYSICAL EXAMINATION: General:  Thin, chronically ill appearing female, NAD  Neuro:  Awake and interactive, refusing to answer most questions HEENT:  [place]/AT, PERRL, EOM-I and MMM Cardiovascular:  RRR, Nl S1/S2, -M/R/G. Lungs:  CTA bilaterally Abdomen:  Soft, NT, ND and +BS Musculoskeletal:  Warm and dry, no edema   LABS:  BMET  Recent Labs Lab 10/30/16 0357 10/30/16 0930 10/31/16 0331  NA 141 140 138  K 3.2* 3.4* 3.7  CL 105 106 104  CO2 24 25 23   BUN 92* 101* 43*  CREATININE 4.11* 4.25* 3.34*  GLUCOSE 89 106* 197*   Electrolytes  Recent Labs Lab 10/30/16 0357 10/30/16 0930 10/31/16 0331  CALCIUM 10.3 10.5* 9.3  MG  --   --  1.8  PHOS  --  1.9* 2.4*   CBC  Recent Labs Lab 10/30/16 0357 10/30/16 0930  10/30/16 2052 10/31/16 0331 10/31/16 0638  WBC 20.4* 21.0*  --   --  14.3*  --   HGB 10.2* 9.8*  < > 8.4* 8.0* 8.6*  HCT 30.6* 28.9*  < > 25.4* 24.3* 26.8*  PLT 133* 129*  --   --  97*  --   < > = values in this interval not displayed. Coag's No results  for input(s): APTT, INR in the last 168 hours.  Sepsis Markers  Recent Labs Lab 10/29/16 1619 10/29/16 1829  LATICACIDVEN 3.3* 3.3*  PROCALCITON 0.18  --    ABG  Recent Labs Lab 10/29/16 1609  PHART 7.441  PCO2ART 38.7  PO2ART 71.0*   Liver Enzymes  Recent Labs Lab 10/29/16 1220 10/30/16 0930  AST 17  --   ALT 10*  --   ALKPHOS 116  --   BILITOT 0.7  --   ALBUMIN 3.1* 2.6*   Cardiac Enzymes  Recent Labs Lab 10/29/16 1619 10/29/16 2202 10/30/16 0357  TROPONINI 0.04* 0.05* 0.08*   Glucose  Recent Labs Lab 10/30/16 1330 10/30/16 1547 10/30/16 2025 10/30/16 2330 10/31/16 0331 10/31/16 0820  GLUCAP 121* 169* 183* 204* 172* 188*   Imaging Dg Chest Port 1 View  Result Date: 10/30/2016 CLINICAL DATA:  Central line placement.  Initial encounter. EXAM: PORTABLE CHEST 1 VIEW COMPARISON:  Chest radiograph performed 02/09/2016 FINDINGS: A left IJ line is noted ending about the mid to distal SVC. The lungs appear clear bilaterally. No focal consolidation, pleural effusion or pneumothorax is seen. The cardiomediastinal silhouette is  borderline normal in size. No acute osseous abnormalities are identified. IMPRESSION: 1. Left IJ line noted ending about the mid to distal SVC. 2. No acute cardiopulmonary process seen. Electronically Signed   By: Roanna RaiderJeffery  Chang M.D.   On: 10/30/2016 19:39     STUDIES:  CT head 6/21 > neg acute  CT abd/pelvis 6/21 > Fluid distended stomach may be transient or, sequelae of gastric Paresis.  No acute intra-abdominal or pelvic process by noncontrast CT.  CULTURES: BC x 2 6/21>>> MRSA by PCR 6/21 > Negative   ANTIBIOTICS: Non  SIGNIFICANT EVENTS: 6/21 > Presents to ED with HHNK and AMS   LINES/TUBES: PIV Left AV Graft   I reviewed CXR myself, no acute disease noted  DISCUSSION: 61yo female with hx DM, ESRD, HTN presenting with HTN crisis, HHNK, AMS.   ASSESSMENT / PLAN:  PULMONARY Risk for Respiratory Insufficiency r/t  AMS  P:   Maintain Oxygen >92 IS per RT protocol Ambulate  CARDIOVASCULAR HTN crisis  Elevated Troponin > most likely demand ischemia, no EKG changes 0.04>0.05>0.06  P:  Cardiac Monitoring  D/C cardene drip, BP acceptable now PRN Labetalol and Hydralazine   RENAL ESRD - t/th/s HD.  Did not have HD 6/21 Hyponatremia - mild   Hypokalemia  P:   Nephrology to determine HD HD 6/22  Trend BMP Replace electrolytes as needed  KVO IVF  GASTROINTESTINAL Nausea/vomiting, abd pain  -CT abd unremarkable  P:   SLP PRN zofran   HEMATOLOGIC Anemia - chronic  P:  Trend CBC Sub q heparin  Transfuse for Hemoglobin <7  INFECTIOUS Leucocytosis  -Procal 0.18, Lactic Acid 3.3  P:   Trend WBC and Fever Curve Follow Culture Data   ENDOCRINE DM  HHNK   P:   ISS CBG  NEUROLOGIC AMS - r/t HTN crisis and HHNK  ?seizure activity - CT head neg  P:   Monitor  Consult Neurology appreciated Scan per neuro  FAMILY  - Updates:  Daughter updated at bedside 6/22, DNI but otherwise full medical therapy  - Inter-disciplinary family meet or Palliative Care meeting due by:  6/28  Discussed with PCCM-NP, [facility] with [facility]M off 6/24  Alyson ReedyWesam G. Gianne Shugars, M.D. [facility]. Pager: [phone]. After hours pager: [phone].

## 2016-10-31 NOTE — Progress Notes (Signed)
Hemoglobin 7.1, Dr Molli KnockYacoub notified. No new orders at this time. Patient due for hemoglobin check around 1830

## 2016-10-31 NOTE — Progress Notes (Signed)
Subjective: Greatly improved  Exam: Vitals:   10/31/16 0600 10/31/16 0700  BP: (!) 176/146 (!) 140/92  Pulse: 90 88  Resp: (!) 24 17  Temp:     Gen: In bed, NAD Resp: non-labored breathing, no acute distress Abd: soft, nt  Neuro: MS: awake, alert, oriented to place, month CN:VFF, EOMI Motor: follows commands in all 4 extremities, moves symmetrically with good strength.  Sensory: intact to LT  Pertinent Labs: Cr 3.34, glucose 197  Impression: 61 yo F with AMS in the setting of severe hypertension, multiple medical abnormalities. With continued improvement, I think that a primary neurological cause of her AMS is very unlikely. Though PRES is possible, treatment would be hypertension control which is being pursued anyway.  I think therefore that MRI is unlikely to change amangement.   Recommendations: 1)Could consider MRI, but unlikely to change management at this time.  2) Please call if the patient does not continue to improve as expected.   Ritta SlotMcNeill Kawanda Drumheller, MD [facility] [phone]  If 7pm- 7am, please [facility] neurology on call as listed in AMION.

## 2016-11-01 LAB — HEMOGLOBIN AND HEMATOCRIT, BLOOD
HEMATOCRIT: 22.5 % — AB (ref 36.0–46.0)
Hemoglobin: 7.2 g/dL — ABNORMAL LOW (ref 12.0–15.0)

## 2016-11-01 LAB — CBC
HEMATOCRIT: 23.2 % — AB (ref 36.0–46.0)
HEMOGLOBIN: 7.4 g/dL — AB (ref 12.0–15.0)
MCH: 29.8 pg (ref 26.0–34.0)
MCHC: 31.9 g/dL (ref 30.0–36.0)
MCV: 93.5 fL (ref 78.0–100.0)
Platelets: 97 10*3/uL — ABNORMAL LOW (ref 150–400)
RBC: 2.48 MIL/uL — ABNORMAL LOW (ref 3.87–5.11)
RDW: 14 % (ref 11.5–15.5)
WBC: 10.6 10*3/uL — AB (ref 4.0–10.5)

## 2016-11-01 LAB — BASIC METABOLIC PANEL
ANION GAP: 10 (ref 5–15)
BUN: 43 mg/dL — ABNORMAL HIGH (ref 6–20)
CHLORIDE: 108 mmol/L (ref 101–111)
CO2: 24 mmol/L (ref 22–32)
Calcium: 9.3 mg/dL (ref 8.9–10.3)
Creatinine, Ser: 4.35 mg/dL — ABNORMAL HIGH (ref 0.44–1.00)
GFR calc non Af Amer: 10 mL/min — ABNORMAL LOW (ref >60)
GFR, EST AFRICAN AMERICAN: 12 mL/min — AB (ref >60)
Glucose, Bld: 150 mg/dL — ABNORMAL HIGH (ref 65–99)
Potassium: 3.5 mmol/L (ref 3.5–5.1)
Sodium: 142 mmol/L (ref 135–145)

## 2016-11-01 LAB — GLUCOSE, CAPILLARY
GLUCOSE-CAPILLARY: 121 mg/dL — AB (ref 65–99)
GLUCOSE-CAPILLARY: 140 mg/dL — AB (ref 65–99)
GLUCOSE-CAPILLARY: 141 mg/dL — AB (ref 65–99)
GLUCOSE-CAPILLARY: 161 mg/dL — AB (ref 65–99)
GLUCOSE-CAPILLARY: 250 mg/dL — AB (ref 65–99)
Glucose-Capillary: 124 mg/dL — ABNORMAL HIGH (ref 65–99)

## 2016-11-01 LAB — URINALYSIS, ROUTINE W REFLEX MICROSCOPIC
BILIRUBIN URINE: NEGATIVE
Glucose, UA: 150 mg/dL — AB
Hgb urine dipstick: NEGATIVE
KETONES UR: NEGATIVE mg/dL
Leukocytes, UA: NEGATIVE
Nitrite: NEGATIVE
Protein, ur: 100 mg/dL — AB
Specific Gravity, Urine: 1.014 (ref 1.005–1.030)
pH: 5 (ref 5.0–8.0)

## 2016-11-01 LAB — MAGNESIUM: Magnesium: 2.5 mg/dL — ABNORMAL HIGH (ref 1.7–2.4)

## 2016-11-01 LAB — PHOSPHORUS: PHOSPHORUS: 2.9 mg/dL (ref 2.5–4.6)

## 2016-11-01 MED ORDER — SENNOSIDES-DOCUSATE SODIUM 8.6-50 MG PO TABS
1.0000 | ORAL_TABLET | Freq: Two times a day (BID) | ORAL | Status: DC
  Filled 2016-11-01: qty 1

## 2016-11-01 MED ORDER — INSULIN ASPART 100 UNIT/ML ~~LOC~~ SOLN
0.0000 [IU] | Freq: Every day | SUBCUTANEOUS | Status: DC
  Administered 2016-11-02: 3 [IU] via SUBCUTANEOUS

## 2016-11-01 MED ORDER — HYDRALAZINE HCL 20 MG/ML IJ SOLN: 10.0000 mg | INTRAMUSCULAR | Status: DC | PRN

## 2016-11-01 MED ORDER — METOCLOPRAMIDE HCL 5 MG/ML IJ SOLN
5.0000 mg | Freq: Once | INTRAMUSCULAR | Status: AC
  Administered 2016-11-01: 5 mg via INTRAVENOUS
  Filled 2016-11-01: qty 2

## 2016-11-01 MED ORDER — INSULIN ASPART 100 UNIT/ML ~~LOC~~ SOLN
0.0000 [IU] | Freq: Three times a day (TID) | SUBCUTANEOUS | Status: DC
Start: 2016-11-01 — End: 2016-11-03
  Administered 2016-11-01: 1 [IU] via SUBCUTANEOUS
  Administered 2016-11-01: 3 [IU] via SUBCUTANEOUS
  Administered 2016-11-02: 1 [IU] via SUBCUTANEOUS
  Administered 2016-11-02: 2 [IU] via SUBCUTANEOUS
  Administered 2016-11-03: 3 [IU] via SUBCUTANEOUS

## 2016-11-01 MED ORDER — AMLODIPINE BESYLATE 10 MG PO TABS
10.0000 mg | ORAL_TABLET | Freq: Every day | ORAL | Status: DC
  Administered 2016-11-02: 10 mg via ORAL
  Filled 2016-11-01: qty 1

## 2016-11-01 MED ORDER — METOCLOPRAMIDE HCL 5 MG/ML IJ SOLN
5.0000 mg | Freq: Three times a day (TID) | INTRAMUSCULAR | Status: DC
  Administered 2016-11-02 – 2016-11-03 (×5): 5 mg via INTRAVENOUS
  Filled 2016-11-01 (×5): qty 2

## 2016-11-01 MED ORDER — METOCLOPRAMIDE HCL 5 MG PO TABS
5.0000 mg | ORAL_TABLET | Freq: Three times a day (TID) | ORAL | Status: DC
  Administered 2016-11-01: 5 mg via ORAL
  Filled 2016-11-01 (×3): qty 1

## 2016-11-01 MED ORDER — SIMETHICONE 80 MG PO CHEW
40.0000 mg | CHEWABLE_TABLET | Freq: Four times a day (QID) | ORAL | Status: DC | PRN
  Administered 2016-11-02: 40 mg via ORAL
  Filled 2016-11-01 (×2): qty 1

## 2016-11-01 NOTE — Evaluation (Signed)
Physical Therapy Evaluation Patient Details Name: Madeline [name] MRN: [id number] DOB: Feb 06, 1956 Today's Date: 11/01/2016   History of Present Illness  61yo female with hx ESRD, HTN, OSA, pulmonary hypertension, and CHF who presented 6/21 with c/o abd pain, n/v and AMS.  In ER she had brief seizure-like episode which improved with Ativan.  W/u was also notable for glucose >700, BP 238/97, Cr 3.4.    Patient with hypertensive urgency, uncontrolled DM, gastroparesis, acute encephalopathy, poss seizure.     Clinical Impression  Patient presents with problems listed below.  Will benefit from acute PT to maximize functional mobility prior to discharge.  Patient able to ambulate with RW and min assist 12'.  Patient with general weakness, decreased balance, impacting mobility and gait.  Recommend HHPT at d/c for continued therapy if patient continues to make improvements.   If slow progress, may need to consider SNF.    Follow Up Recommendations Home health PT;Supervision for mobility/OOB    Equipment Recommendations  None recommended by PT    Recommendations for Other Services OT consult     Precautions / Restrictions Precautions Precautions: Fall Restrictions Weight Bearing Restrictions: No      Mobility  Bed Mobility Overal bed mobility: Needs Assistance Bed Mobility: Supine to Sit;Sit to Supine     Supine to sit: Min assist;+2 for safety/equipment Sit to supine: Min assist;+2 for safety/equipment   General bed mobility comments: Assist to move trunk to upright position.  And to bring LE's onto bed to return to supine.  Transfers Overall transfer level: Needs assistance Equipment used: Rolling walker (2 wheeled) Transfers: Sit to/from Stand Sit to Stand: Min assist;+2 physical assistance         General transfer comment: Verbal cues for hand placement.  Assist to power up to standing.  Assist to control descent to bed to return to sitting.  Ambulation/Gait Ambulation/Gait  assistance: Min assist;+2 safety/equipment Ambulation Distance (Feet): 12 Feet Assistive device: Rolling walker (2 wheeled) Gait Pattern/deviations: Step-through pattern;Decreased step length - right;Decreased step length - left;Decreased stride length;Shuffle Gait velocity: decreased Gait velocity interpretation: Below normal speed for age/gender General Gait Details: Patient with slow, slightly unsteady gait.  Short, shuffling steps.  Demonstrated safe use of RW.  Fatigued quickly.  Stairs            Wheelchair Mobility    Modified Rankin (Stroke Patients Only)       Balance Overall balance assessment: Needs assistance         Standing balance support: Bilateral upper extremity supported Standing balance-Leahy Scale: Poor                               Pertinent Vitals/Pain Pain Assessment: Faces Faces Pain Scale: No hurt    Home Living Family/patient expects to be discharged to:: Private residence Living Arrangements: Other relatives (brother) Available Help at Discharge: Family;Available 24 hours/day Type of Home: House Home Access: Stairs to enter Entrance Stairs-Rails: Right;Left (One set of stairs with Rt rail, and the other with Lt rail) Entrance Stairs-Number of Steps: 3 or 4 depending on side of entry Home Layout: One level Home Equipment: Walker - 4 wheels;Bedside commode;Cane - single point;Shower seat;Hand held shower head;Grab bars - tub/shower      Prior Function Level of Independence: Needs assistance   Gait / Transfers Assistance Needed: uses cane or walker at all times  ADL's / Homemaking Assistance Needed: takes transportation to dialysis  Hand Dominance   Dominant Hand: Right    Extremity/Trunk Assessment   Upper Extremity Assessment Upper Extremity Assessment: Generalized weakness    Lower Extremity Assessment Lower Extremity Assessment: Generalized weakness       Communication   Communication: No  difficulties  Cognition Arousal/Alertness: Awake/alert Behavior During Therapy: Flat affect Overall Cognitive Status: No family/caregiver present to determine baseline cognitive functioning                                 General Comments: Patient able to follow commands.  Slow response time.  Minimal conversation.      General Comments      Exercises     Assessment/Plan    PT Assessment Patient needs continued PT services  PT Problem List Decreased strength;Decreased activity tolerance;Decreased balance;Decreased mobility;Decreased cognition       PT Treatment Interventions DME instruction;Gait training;Stair training;Functional mobility training;Therapeutic activities;Therapeutic exercise;Balance training;Cognitive remediation;Patient/family education    PT Goals (Current goals can be found in the Care Plan section)  Acute Rehab PT Goals Patient Stated Goal: None stated PT Goal Formulation: With patient Time For Goal Achievement: 11/15/16 Potential to Achieve Goals: Good    Frequency Min 3X/week   Barriers to discharge        Co-evaluation               AM-PAC PT "6 Clicks" Daily Activity  Outcome Measure Difficulty turning over in bed (including adjusting bedclothes, sheets and blankets)?: None Difficulty moving from lying on back to sitting on the side of the bed? : Total Difficulty sitting down on and standing up from a chair with arms (e.g., wheelchair, bedside commode, etc,.)?: Total Help needed moving to and from a bed to chair (including a wheelchair)?: A Little Help needed walking in hospital room?: A Little Help needed climbing 3-5 steps with a railing? : A Little 6 Click Score: 15    End of Session Equipment Utilized During Treatment: Gait belt Activity Tolerance: Patient tolerated treatment well;Patient limited by fatigue Patient left: in bed;with call bell/phone within reach;with nursing/sitter in room Nurse Communication:  Mobility status PT Visit Diagnosis: Unsteadiness on feet (R26.81);Other abnormalities of gait and mobility (R26.89);Muscle weakness (generalized) (M62.81)    Time: 1610-9604 PT Time Calculation (min) (ACUTE ONLY): 14 min   Charges:   PT Evaluation $PT Eval High Complexity: 1 Procedure     PT G Codes:        Durenda Hurt. Renaldo Fiddler, [facility] Acute Rehab Services Pager [phone]   Vena Austria 11/01/2016, 10:22 PM

## 2016-11-01 NOTE — Progress Notes (Addendum)
[place] TEAM 1 - Stepdown/ICU TEAM  Tamicka Tessmer  ZOX:[id number] DOB: [birth year] DOA: 10/29/2016 PCP: Barbie BannerWilson, [name], MD    Brief Narrative:  61yo female with hx ESRD, HTN, OSA, pulmonary hypertension, and CHF who presented 6/21 with c/o abd pain, n/v and AMS.  In ER she had brief seizure-like episode which improved with Ativan.  W/u was also notable for glucose >700, BP 238/97, Cr 3.4.  She was started on cardene gtt and PCCM called for ICU admission.  Significant Events: 6/21 admit w/ HTN crisis, HONK, and AMS 6/24 [facility] assumed care   Subjective: The patient is alert and conversant.  She complains of some vague crampy abdominal pain which she admits is a very chronic issue for her.  She denies vomiting.  She denies chest pain shortness of breath fevers or chills.  Assessment & Plan:  HTN emergency Blood pressure much better controlled but not yet at goal - adjust treatment and follow - will need to follow postdialysis to assure she is not overcorrected  Elevated troponin most consistent with demand ischemia - in absence of EKG change or chest pain will not plan to pursue further  HONK - uncontrolled DM CBG now controlled - follow trend  Abdom cramping/pain CT abdomen/pelvis unremarkable - physical exam not worrisome - suspect slow GI transit/gastroparesis related to severely uncontrolled diabetes - trial of oral Reglan  Acute encephalopathy Due to hypertensive crisis and HONK - appears that her mental status is significantly improved at this time  Possible seizure CT head negative - EEG without acute findings - likely related to acute severe metabolic derangements at time of admission  Transient coffee ground emesis Has been evaluated by GI - no plans for EGD unless sx recur - CT abdomen/pelvis negative - Hgb holding steady   Recent Labs Lab 10/31/16 0638 10/31/16 1311 10/31/16 1851 11/01/16 0010 11/01/16 0424  HGB 8.6* 7.1* 7.4* 7.2* 7.4*    ESRD on  T/Th/Sat Nephrology following and attending to dialysis  Grade 2 Diastolic CHF As per TTE Nov 2017 - volume control per HD  DVT prophylaxis: SCDs Code Status: DO NOT INTUBATE Family Communication: no family present at time of exam  Disposition Plan: Transfer to renal floor  Consultants:  [facility] PCCM Nephrology   Antimicrobials:  none  Objective: Blood pressure (!) 175/65, pulse 75, temperature 98.6 F (37 C), temperature source Oral, resp. rate (!) 21, height 4\' 9"  (1.448 m), weight 57 kg (125 lb 10.6 oz), SpO2 99 %.  Intake/Output Summary (Last 24 hours) at 11/01/16 0938 Last data filed at 11/01/16 0700  Gross per 24 hour  Intake              810 ml  Output                0 ml  Net              810 ml   Filed Weights   10/30/16 1328 10/31/16 0346 11/01/16 0434  Weight: 59.5 kg (131 lb 2.8 oz) 57.3 kg (126 lb 5.2 oz) 57 kg (125 lb 10.6 oz)    Examination: General: No acute respiratory distress Lungs: Clear to auscultation bilaterally without wheezes or crackles Cardiovascular: Regular rate and rhythm without murmur gallop or rub normal S1 and S2 Abdomen: Nontender, nondistended, soft, bowel sounds positive, no rebound, no ascites, no appreciable mass Extremities: No significant cyanosis, clubbing, or edema bilateral lower extremities  CBC:  Recent Labs Lab 10/29/16 1220  10/29/16  1619 10/30/16 0357 10/30/16 0930  10/31/16 0331 10/31/16 0638 10/31/16 1311 10/31/16 1851 11/01/16 0010 11/01/16 0424  WBC 12.4*  --  13.6* 20.4* 21.0*  --  14.3*  --   --   --   --  10.6*  NEUTROABS 11.0*  --   --   --   --   --   --   --   --   --   --   --   HGB 10.8*  < > 10.0* 10.2* 9.8*  < > 8.0* 8.6* 7.1* 7.4* 7.2* 7.4*  HCT 34.6*  < > 31.2* 30.6* 28.9*  < > 24.3* 26.8* 22.0* 22.6* 22.5* 23.2*  MCV 94.0  --  92.6 90.5 87.8  --  91.4  --   --   --   --  93.5  PLT 122*  --  125* 133* 129*  --  97*  --   --   --   --  97*  < > = values in this interval not displayed. Basic  Metabolic Panel:  Recent Labs Lab 10/30/16 0041 10/30/16 0357 10/30/16 0930 10/31/16 0331 11/01/16 0424  NA 143 141 140 138 142  K 2.6* 3.2* 3.4* 3.7 3.5  CL 105 105 106 104 108  CO2 24 24 25 23 24   GLUCOSE 143* 89 106* 197* 150*  BUN 85* 92* 101* 43* 43*  CREATININE 4.06* 4.11* 4.25* 3.34* 4.35*  CALCIUM 10.4* 10.3 10.5* 9.3 9.3  MG  --   --   --  1.8 2.5*  PHOS  --   --  1.9* 2.4* 2.9   GFR: Estimated Creatinine Clearance: 10.1 mL/min (A) (by C-G formula based on SCr of 4.35 mg/dL (H)).  Liver Function Tests:  Recent Labs Lab 10/29/16 1220 10/30/16 0930  AST 17  --   ALT 10*  --   ALKPHOS 116  --   BILITOT 0.7  --   PROT 5.9*  --   ALBUMIN 3.1* 2.6*    Recent Labs Lab 10/29/16 1220  LIPASE 22    Recent Labs Lab 10/30/16 1526  AMMONIA 11   Cardiac Enzymes:  Recent Labs Lab 10/29/16 1619 10/29/16 2202 10/30/16 0357  TROPONINI 0.04* 0.05* 0.08*    CBG:  Recent Labs Lab 10/31/16 1616 10/31/16 2024 11/01/16 0006 11/01/16 0403 11/01/16 0809  GLUCAP 158* 136* 161* 140* 124*    Recent Results (from the past 240 hour(s))  Culture, blood (Routine X 2) w Reflex to ID Panel     Status: None (Preliminary result)   Collection Time: 10/29/16  4:30 PM  Result Value Ref Range Status   Specimen Description BLOOD RIGHT ANTECUBITAL  Final   Special Requests   Final    BOTTLES DRAWN AEROBIC AND ANAEROBIC Blood Culture adequate volume   Culture NO GROWTH 2 DAYS  Final   Report Status PENDING  Incomplete  Culture, blood (Routine X 2) w Reflex to ID Panel     Status: None (Preliminary result)   Collection Time: 10/29/16  4:38 PM  Result Value Ref Range Status   Specimen Description BLOOD RIGHT HAND  Final   Special Requests   Final    BOTTLES DRAWN AEROBIC ONLY Blood Culture adequate volume   Culture NO GROWTH 2 DAYS  Final   Report Status PENDING  Incomplete  MRSA PCR Screening     Status: None   Collection Time: 10/29/16  6:14 PM  Result Value Ref  Range Status   MRSA by PCR  NEGATIVE NEGATIVE Final    Comment:        The GeneXpert MRSA Assay (FDA approved for NASAL specimens only), is one component of a comprehensive MRSA colonization surveillance program. It is not intended to diagnose MRSA infection nor to guide or monitor treatment for MRSA infections.      Scheduled Meds: . amLODipine  10 mg Oral QHS  . chlorhexidine  15 mL Mouth Rinse BID  . Chlorhexidine Gluconate Cloth  6 each Topical Daily  . [START ON 11/02/2016] darbepoetin (ARANESP) injection - DIALYSIS  100 mcg Intravenous Q Mon-HD  . doxercalciferol  6 mcg Intravenous Q T,Th,Sa-HD  . insulin aspart  0-9 Units Subcutaneous Q4H  . mouth rinse  15 mL Mouth Rinse q12n4p  . [START ON 11/03/2016] pantoprazole  40 mg Intravenous Q12H  . simethicone  40 mg Oral QID  . sodium chloride flush  10-40 mL Intracatheter Q12H  . sodium chloride flush  10-40 mL Intracatheter Q12H     LOS: 3 days   Lonia Blood, MD [facility] Office  [phone] Pager - Text [place] per Loretha Stapler as per below:  On-Call/Text [place]:      [email or web address]      password [email or web address]  If 7PM-7AM, please contact night-coverage [email or web address] Password [facility] 11/01/2016, 9:38 AM

## 2016-11-01 NOTE — Progress Notes (Addendum)
Paged to come to bedside regarding patient's daughter being upset with overall care. From what was gathered it appears that there was some miscommunication on what test and studies would be completed and when. The patient's daughter had been under the impression that the patient will have her hemoglobin checked earlier this afternoon for recent bleeding, regular stools guaiac checks to see if bleeding stopped, and urinalysis since the patient was complaining of burning with urination sometime yesterday night. She complained that it feels like no one knows what's going on with her mother's care and consultants have signed off without thorough investigation. The patient's daughter was very clear that she would like to be more involved in her overall care and be made privy to as all information regarding her mother's treatment. Ordered place for urinalysis and daily stool guaiac checks.

## 2016-11-01 NOTE — Progress Notes (Signed)
Subjective: no events overnight - tmax 99.8- no overt bleeding- hgb down again   Objective Vital signs in last 24 hours: Vitals:   11/01/16 0600 11/01/16 0630 11/01/16 0700 11/01/16 0730  BP:      Pulse: 75     Resp: (!) 26 (!) 23 (!) 26 (!) 21  Temp:      TempSrc:      SpO2: 99%     Weight:      Height:       Weight change: -2.1 kg (-4 lb 10.1 oz)  Intake/Output Summary (Last 24 hours) at 11/01/16 0809 Last data filed at 11/01/16 0700  Gross per 24 hour  Intake              835 ml  Output                0 ml  Net              835 ml   Dialysis Orders: [facility] T,Th,S 4 hrs 400/Auto 1.5 57.5 kg 3.0 K/2.25 Ca UF Profile 4 -Heparin: 4000 units IV TIW -Hectorol 6 mcg IV TIW (last PTH 389 10/01/16) -Mircera 50 mcg IV q 4 weeks (last dose 10/08/2016 Next dose due 11/05/16 Last HGB 10.5 10/22/16)  Assessment/Plan: 1.  HHNK/Hyperglycemia: BS > 700. Insulin gtt per primary- sugar  is now down  2.  AMS: CT of head negative. Likely metabolic issue- also high BPs and sedating meds could be contrib- at baseline very simple but alert- MS has improved 3.  Abd pain/N & V: CT of Ab/pelvis negative. Per primary  Now with GIB 4.  ESRD - T,Th,S as OP via left AVG. Had HD yest 6/22 off schedule- will likely do Monday again off schedule. No needs today  5.  Hypertension/volume  - BP labile- previous cardene gtt-now Dc'd. No evidence of volume overload on CT of abdomen or exam.UFG to OP EDW 57.5 kg. Supposedly on amlodipine and coreg max dose as OP-  PRN ivs- will resume amlodipine 10 q HS 6.  Anemia  - HGB 10.2 6/22-Last mircera 5/31- had acute GIB  it appears, hgb down to 8's now 7's this AM- on protonix drip- gave additional  darbe this hosp 7.  Metabolic bone disease -  Continue VDRA/no binder since phos is low- no sensipar on home med list   8.  Nutrition - taking diet now 9. Hypokalemia- given repletion 6/22- ran on high K bath  10. Elevated WBC- ? etiol- per primary- no abx - trending down-  low grade temp  Madeline [name] A    Labs: Basic Metabolic Panel:  Recent Labs Lab 10/30/16 0930 10/31/16 0331 11/01/16 0424  NA 140 138 142  K 3.4* 3.7 3.5  CL 106 104 108  CO2 25 23 24   GLUCOSE 106* 197* 150*  BUN 101* 43* 43*  CREATININE 4.25* 3.34* 4.35*  CALCIUM 10.5* 9.3 9.3  PHOS 1.9* 2.4* 2.9   Liver Function Tests:  Recent Labs Lab 10/29/16 1220 10/30/16 0930  AST 17  --   ALT 10*  --   ALKPHOS 116  --   BILITOT 0.7  --   PROT 5.9*  --   ALBUMIN 3.1* 2.6*    Recent Labs Lab 10/29/16 1220  LIPASE 22    Recent Labs Lab 10/30/16 1526  AMMONIA 11   CBC:  Recent Labs Lab 10/29/16 1220  10/29/16 1619 10/30/16 0357 10/30/16 0930  10/31/16 0331  10/31/16 1851 11/01/16 0010 11/01/16  0424  WBC 12.4*  --  13.6* 20.4* 21.0*  --  14.3*  --   --   --  10.6*  NEUTROABS 11.0*  --   --   --   --   --   --   --   --   --   --   HGB 10.8*  < > 10.0* 10.2* 9.8*  < > 8.0*  < > 7.4* 7.2* 7.4*  HCT 34.6*  < > 31.2* 30.6* 28.9*  < > 24.3*  < > 22.6* 22.5* 23.2*  MCV 94.0  --  92.6 90.5 87.8  --  91.4  --   --   --  93.5  PLT 122*  --  125* 133* 129*  --  97*  --   --   --  97*  < > = values in this interval not displayed. Cardiac Enzymes:  Recent Labs Lab 10/29/16 1619 10/29/16 2202 10/30/16 0357  TROPONINI 0.04* 0.05* 0.08*   CBG:  Recent Labs Lab 10/31/16 1219 10/31/16 1616 10/31/16 2024 11/01/16 0006 11/01/16 0403  GLUCAP 172* 158* 136* 161* 140*    Iron Studies: No results for input(s): IRON, TIBC, TRANSFERRIN, FERRITIN in the last 72 hours. Studies/Results: Dg Chest Port 1 View  Result Date: 10/30/2016 CLINICAL DATA:  Central line placement.  Initial encounter. EXAM: PORTABLE CHEST 1 VIEW COMPARISON:  Chest radiograph performed 02/09/2016 FINDINGS: A left IJ line is noted ending about the mid to distal SVC. The lungs appear clear bilaterally. No focal consolidation, pleural effusion or pneumothorax is seen. The cardiomediastinal  silhouette is borderline normal in size. No acute osseous abnormalities are identified. IMPRESSION: 1. Left IJ line noted ending about the mid to distal SVC. 2. No acute cardiopulmonary process seen. Electronically Signed   By: Roanna Raider M.D.   On: 10/30/2016 19:39   Medications: Infusions: . dextrose 5 % and 0.45% NaCl 10 mL/hr at 11/01/16 0225  . niCARDipine    . pantoprozole (PROTONIX) infusion 8 mg/hr (11/01/16 0224)    Scheduled Medications: . chlorhexidine  15 mL Mouth Rinse BID  . Chlorhexidine Gluconate Cloth  6 each Topical Daily  . [START ON 11/02/2016] darbepoetin (ARANESP) injection - DIALYSIS  100 mcg Intravenous Q Mon-HD  . doxercalciferol  6 mcg Intravenous Q T,Th,Sa-HD  . insulin aspart  0-9 Units Subcutaneous Q4H  . mouth rinse  15 mL Mouth Rinse q12n4p  . [START ON 11/03/2016] pantoprazole  40 mg Intravenous Q12H  . simethicone  40 mg Oral QID  . sodium chloride flush  10-40 mL Intracatheter Q12H  . sodium chloride flush  10-40 mL Intracatheter Q12H    have reviewed scheduled and prn medications.  Physical Exam: General: more alert still , recognizes me "im waitin on a plate" stomach better  Heart: RRR Lungs: mostly clear Abdomen: soft, non tender, non distended Extremities: minimal edema Dialysis Access: left upper arm AVG     11/01/2016,8:09 AM  LOS: 3 days

## 2016-11-01 NOTE — Progress Notes (Signed)
Patient arrived on unit via bed from 2M.  No family at bedside.  Telemetry placed per MD order and CMT notified. 

## 2016-11-01 NOTE — Progress Notes (Signed)
Report given to RN Lurena Joinerebecca, 6 [address] room 5

## 2016-11-01 NOTE — Significant Event (Signed)
Rapid Response Event Note  Overview: Called by RN d/t concern for patients mental status and family member upset regarding care of patient Time Called: 2047 Arrival Time: 2048 Event Type: Neurologic  Initial Focused Assessment: Pt laying in bed, opens eyes to voice, says "no" when asked to squeeze hands, moves all extremities, R pupil 4 and brisk, L eye pt refused to open for assessment.  Pt respirations shallow and diminished.  Murmur present on auscultation.  Skin warm to touch.  T-99.3 (R), HR-70s, BP-156/58, RR-16, SpO2- 97% on RA. Dtr upset because she feels like "no one knows whats going on with my mom" because nurses have to look in the computer to answer her questions.    Interventions: Neuro status same as previously charted.  Spoke with dtr regarding nursing care and the need for nurses to sometimes look in patients chart to answer questions. All questions answered and support given.  Dr. Katrinka BlazingSmith also to bedside to speak with family member.   Plan of Care (if not transferred): Monitor pt. Call RRT if further assistance needed.  Event Summary: Name of Physician Notified: Schorr, NP and Dr. Katrinka BlazingSmith notified by Schorr NP at  ([phone])    at    Outcome: Stayed in room and stabalized     SumnerHopper, Dimas AguasMindy Anderson

## 2016-11-02 LAB — FOLATE: FOLATE: 6.1 ng/mL (ref >5.9)

## 2016-11-02 LAB — IRON AND TIBC
IRON: 30 ug/dL (ref 28–170)
Saturation Ratios: 21 % (ref 10.4–31.8)
TIBC: 143 ug/dL — ABNORMAL LOW (ref 250–450)
UIBC: 113 ug/dL

## 2016-11-02 LAB — CBC
HCT: 24.4 % — ABNORMAL LOW (ref 36.0–46.0)
HEMOGLOBIN: 7.6 g/dL — AB (ref 12.0–15.0)
MCH: 29.3 pg (ref 26.0–34.0)
MCHC: 31.1 g/dL (ref 30.0–36.0)
MCV: 94.2 fL (ref 78.0–100.0)
Platelets: 119 10*3/uL — ABNORMAL LOW (ref 150–400)
RBC: 2.59 MIL/uL — AB (ref 3.87–5.11)
RDW: 14 % (ref 11.5–15.5)
WBC: 9.2 10*3/uL (ref 4.0–10.5)

## 2016-11-02 LAB — RENAL FUNCTION PANEL
ALBUMIN: 2.4 g/dL — AB (ref 3.5–5.0)
Anion gap: 13 (ref 5–15)
BUN: 47 mg/dL — ABNORMAL HIGH (ref 6–20)
CALCIUM: 8.9 mg/dL (ref 8.9–10.3)
CO2: 19 mmol/L — ABNORMAL LOW (ref 22–32)
Chloride: 111 mmol/L (ref 101–111)
Creatinine, Ser: 5.35 mg/dL — ABNORMAL HIGH (ref 0.44–1.00)
GFR calc Af Amer: 9 mL/min — ABNORMAL LOW (ref >60)
GFR, EST NON AFRICAN AMERICAN: 8 mL/min — AB (ref >60)
GLUCOSE: 141 mg/dL — AB (ref 65–99)
PHOSPHORUS: 3.8 mg/dL (ref 2.5–4.6)
Potassium: 3.3 mmol/L — ABNORMAL LOW (ref 3.5–5.1)
SODIUM: 143 mmol/L (ref 135–145)

## 2016-11-02 LAB — OCCULT BLOOD X 1 CARD TO LAB, STOOL: FECAL OCCULT BLD: NEGATIVE

## 2016-11-02 LAB — GLUCOSE, CAPILLARY
GLUCOSE-CAPILLARY: 147 mg/dL — AB (ref 65–99)
GLUCOSE-CAPILLARY: 178 mg/dL — AB (ref 65–99)
Glucose-Capillary: 115 mg/dL — ABNORMAL HIGH (ref 65–99)
Glucose-Capillary: 252 mg/dL — ABNORMAL HIGH (ref 65–99)

## 2016-11-02 LAB — VITAMIN B12: VITAMIN B 12: 873 pg/mL (ref 180–914)

## 2016-11-02 LAB — RETICULOCYTES
RBC.: 2.59 MIL/uL — AB (ref 3.87–5.11)
Retic Count, Absolute: 59.6 10*3/uL (ref 19.0–186.0)
Retic Ct Pct: 2.3 % (ref 0.4–3.1)

## 2016-11-02 LAB — FERRITIN: Ferritin: 577 ng/mL — ABNORMAL HIGH (ref 11–307)

## 2016-11-02 MED ORDER — PRO-STAT SUGAR FREE PO LIQD
30.0000 mL | Freq: Two times a day (BID) | ORAL | Status: DC
  Administered 2016-11-02: 30 mL via ORAL
  Filled 2016-11-02 (×2): qty 30

## 2016-11-02 MED ORDER — NEPRO/CARBSTEADY PO LIQD
237.0000 mL | Freq: Two times a day (BID) | ORAL | Status: DC
Start: 2016-11-02 — End: 2016-11-03
  Administered 2016-11-02 – 2016-11-03 (×2): 237 mL via ORAL

## 2016-11-02 MED ORDER — CINACALCET HCL 30 MG PO TABS
60.0000 mg | ORAL_TABLET | Freq: Every day | ORAL | Status: DC
  Administered 2016-11-02: 60 mg via ORAL
  Filled 2016-11-02: qty 2

## 2016-11-02 MED ORDER — POTASSIUM CHLORIDE CRYS ER 20 MEQ PO TBCR: 40.0000 meq | EXTENDED_RELEASE_TABLET | Freq: Once | ORAL | Status: DC

## 2016-11-02 MED ORDER — SODIUM CHLORIDE 0.9 % IV SOLN: 100.0000 mL | INTRAVENOUS | Status: DC | PRN

## 2016-11-02 MED ORDER — LIDOCAINE HCL (PF) 1 % IJ SOLN: 5.0000 mL | INTRAMUSCULAR | Status: DC | PRN

## 2016-11-02 MED ORDER — ACETAMINOPHEN 325 MG PO TABS
650.0000 mg | ORAL_TABLET | Freq: Four times a day (QID) | ORAL | Status: DC | PRN
Start: 2016-11-02 — End: 2016-11-03
  Administered 2016-11-02: 650 mg via ORAL
  Filled 2016-11-02: qty 2

## 2016-11-02 MED ORDER — LIDOCAINE-PRILOCAINE 2.5-2.5 % EX CREA: 1.0000 "application " | TOPICAL_CREAM | CUTANEOUS | Status: DC | PRN

## 2016-11-02 MED ORDER — PENTAFLUOROPROP-TETRAFLUOROETH EX AERO: 1.0000 "application " | INHALATION_SPRAY | CUTANEOUS | Status: DC | PRN

## 2016-11-02 MED ORDER — DARBEPOETIN ALFA 100 MCG/0.5ML IJ SOSY
PREFILLED_SYRINGE | INTRAMUSCULAR | Status: AC
  Filled 2016-11-02: qty 0.5

## 2016-11-02 MED ORDER — RENA-VITE PO TABS
1.0000 | ORAL_TABLET | Freq: Every day | ORAL | Status: DC
  Administered 2016-11-02: 1 via ORAL
  Filled 2016-11-02: qty 1

## 2016-11-02 NOTE — Progress Notes (Signed)
Patient ID: Madeline [name], female   DOB: 17-Feb-1956, 61 y.o.   MRN: [id number]    PROGRESS NOTE  Madeline [name]  WUJ:[id number] DOB: 1955-09-29 DOA: 10/29/2016  PCP: Barbie Banner, MD   Brief Narrative:  61yo female with hx ESRD, HTN, OSA, pulmonary hypertension, and CHF who presented 6/21 with c/o abd pain, n/v and AMS.  In ER she had brief seizure-like episode which improved with Ativan. W/u was also notable for glucose >700, BP 238/97, Cr 3.4. She was started on cardene gtt and PCCM called for ICU admission.  Significant Events: 6/21 admit w/ HTN crisis, HONK, and AMS 6/24 [facility] assumed care   Assessment & Plan:   HTN emergency - better control  - had HD, SBP in 150's - continue NOrvasc   Elevated troponin - suspected to be related to demands ischemia in the setting of the above - no chest pain, no need to cycle troponins  HONK  - in the setting on uncontrolled DM with complications of ESRD - CBG's more reasonable this am  Thrombocytopenia - mild, reactive - CBC in AM  Hypokalemia - address with HD  Abdom cramping/pain - ? Gastroparesis in the setting of uncontrolled DM - resolved   Acute encephalopathy - metabolic, from hypertensive urgency and HONK - resolved   Possible seizure - likely related to acute severe metabolic derangements at time of admission - CT head and EEG unremarkable   Transient coffee ground emesis - CT abd negative - GI team has seen and did not think intervention is needed unless bleeding recurs  - CBC in AM  ESRD on T/Th/Sat - Nephrology following and attending to dialysis  Grade 2 Diastolic CHF - volume control with HD  DVT prophylaxis: SCDs Code Status: DO NOT INTUBATE Family Communication: no family present at time of exam  Disposition Plan: [place] in AM  Consultants:  Debor[facility] Sprang GI PCCM Nephrology   Antimicrobials:  none  Subjective: Pt denies chest pain or dyspnea.   Objective: Vitals:   11/02/16 1300 11/02/16  1318 11/02/16 1407 11/02/16 1631  BP: (!) 155/70 (!) 161/75 (!) 159/56 (!) 155/60  Pulse: 77 78 81 88  Resp:  16 16 16   Temp:  98.9 F (37.2 C) 98.9 F (37.2 C) (!) 100.6 F (38.1 C)  TempSrc:  Oral Oral Oral  SpO2:  100% 100% 100%  Weight:  56.1 kg (123 lb 10.9 oz)    Height:        Intake/Output Summary (Last 24 hours) at 11/02/16 1713 Last data filed at 11/02/16 1614  Gross per 24 hour  Intake             1000 ml  Output             1825 ml  Net             -825 ml   Filed Weights   11/02/16 0500 11/02/16 0900 11/02/16 1318  Weight: 57 kg (125 lb 10.6 oz) 58.6 kg (129 lb 3 oz) 56.1 kg (123 lb 10.9 oz)    Examination:  General exam: Appears calm and comfortable  Respiratory system: Clear to auscultation. Respiratory effort normal. Cardiovascular system: S1 & S2 heard, RRR. No JVD, murmurs, rubs, gallops or clicks.  Gastrointestinal system: Abdomen is nondistended, soft and nontender. No organomegaly or masses felt. Normal bowel sounds heard. Central nervous system: Alert and oriented. No focal neurological deficits. Psychiatry: Judgement and insight appear normal. Mood & affect appropriate.   Data Reviewed: I  have personally reviewed following labs and imaging studies  CBC:  Recent Labs Lab 10/29/16 1220  10/30/16 0357 10/30/16 0930  10/31/16 0331  10/31/16 1311 10/31/16 1851 11/01/16 0010 11/01/16 0424 11/02/16 0547  WBC 12.4*  < > 20.4* 21.0*  --  14.3*  --   --   --   --  10.6* 9.2  NEUTROABS 11.0*  --   --   --   --   --   --   --   --   --   --   --   HGB 10.8*  < > 10.2* 9.8*  < > 8.0*  < > 7.1* 7.4* 7.2* 7.4* 7.6*  HCT 34.6*  < > 30.6* 28.9*  < > 24.3*  < > 22.0* 22.6* 22.5* 23.2* 24.4*  MCV 94.0  < > 90.5 87.8  --  91.4  --   --   --   --  93.5 94.2  PLT 122*  < > 133* 129*  --  97*  --   --   --   --  97* 119*  < > = values in this interval not displayed. Basic Metabolic Panel:  Recent Labs Lab 10/30/16 0357 10/30/16 0930 10/31/16 0331  11/01/16 0424 11/02/16 0547  NA 141 140 138 142 143  K 3.2* 3.4* 3.7 3.5 3.3*  CL 105 106 104 108 111  CO2 24 25 23 24  19*  GLUCOSE 89 106* 197* 150* 141*  BUN 92* 101* 43* 43* 47*  CREATININE 4.11* 4.25* 3.34* 4.35* 5.35*  CALCIUM 10.3 10.5* 9.3 9.3 8.9  MG  --   --  1.8 2.5*  --   PHOS  --  1.9* 2.4* 2.9 3.8   Liver Function Tests:  Recent Labs Lab 10/29/16 1220 10/30/16 0930 11/02/16 0547  AST 17  --   --   ALT 10*  --   --   ALKPHOS 116  --   --   BILITOT 0.7  --   --   PROT 5.9*  --   --   ALBUMIN 3.1* 2.6* 2.4*    Recent Labs Lab 10/29/16 1220  LIPASE 22    Recent Labs Lab 10/30/16 1526  AMMONIA 11   Cardiac Enzymes:  Recent Labs Lab 10/29/16 1619 10/29/16 2202 10/30/16 0357  TROPONINI 0.04* 0.05* 0.08*   CBG:  Recent Labs Lab 11/01/16 1709 11/01/16 2013 11/02/16 0744 11/02/16 1405 11/02/16 1627  GLUCAP 141* 121* 147* 115* 178*   Anemia Panel:  Recent Labs  11/02/16 0547  VITAMINB12 873  FOLATE 6.1  FERRITIN 577*  TIBC 143*  IRON 30  RETICCTPCT 2.3   Urine analysis:    Component Value Date/Time   COLORURINE YELLOW 11/01/2016 2309   APPEARANCEUR HAZY (A) 11/01/2016 2309   LABSPEC 1.014 11/01/2016 2309   PHURINE 5.0 11/01/2016 2309   GLUCOSEU 150 (A) 11/01/2016 2309   HGBUR NEGATIVE 11/01/2016 2309   BILIRUBINUR NEGATIVE 11/01/2016 2309   KETONESUR NEGATIVE 11/01/2016 2309   PROTEINUR 100 (A) 11/01/2016 2309   UROBILINOGEN 0.2 10/24/2014 0057   NITRITE NEGATIVE 11/01/2016 2309   LEUKOCYTESUR NEGATIVE 11/01/2016 2309   Recent Results (from the past 240 hour(s))  Culture, blood (Routine X 2) w Reflex to ID Panel     Status: None (Preliminary result)   Collection Time: 10/29/16  4:30 PM  Result Value Ref Range Status   Specimen Description BLOOD RIGHT ANTECUBITAL  Final   Special Requests   Final    BOTTLES  DRAWN AEROBIC AND ANAEROBIC Blood Culture adequate volume   Culture NO GROWTH 4 DAYS  Final   Report Status PENDING   Incomplete  Culture, blood (Routine X 2) w Reflex to ID Panel     Status: None (Preliminary result)   Collection Time: 10/29/16  4:38 PM  Result Value Ref Range Status   Specimen Description BLOOD RIGHT HAND  Final   Special Requests   Final    BOTTLES DRAWN AEROBIC ONLY Blood Culture adequate volume   Culture NO GROWTH 4 DAYS  Final   Report Status PENDING  Incomplete  MRSA PCR Screening     Status: None   Collection Time: 10/29/16  6:14 PM  Result Value Ref Range Status   MRSA by PCR NEGATIVE NEGATIVE Final    Comment:        The GeneXpert MRSA Assay (FDA approved for NASAL specimens only), is one component of a comprehensive MRSA colonization surveillance program. It is not intended to diagnose MRSA infection nor to guide or monitor treatment for MRSA infections.     Radiology Studies: No results found.  Scheduled Meds: . amLODipine  10 mg Oral QHS  . cinacalcet  60 mg Oral Q supper  . darbepoetin (ARANESP) injection - DIALYSIS  100 mcg Intravenous Q Mon-HD  . doxercalciferol  6 mcg Intravenous Q T,Th,Sa-HD  . feeding supplement (NEPRO CARB STEADY)  237 mL Oral BID BM  . feeding supplement (PRO-STAT SUGAR FREE 64)  30 mL Oral BID  . insulin aspart  0-5 Units Subcutaneous QHS  . insulin aspart  0-9 Units Subcutaneous TID WC  . mouth rinse  15 mL Mouth Rinse q12n4p  . metoCLOPramide (REGLAN) injection  5 mg Intravenous TID AC & HS  . multivitamin  1 tablet Oral QHS  . [START ON 11/03/2016] pantoprazole  40 mg Intravenous Q12H  . sodium chloride flush  10-40 mL Intracatheter Q12H  . sodium chloride flush  10-40 mL Intracatheter Q12H   Continuous Infusions:   LOS: 4 days   Time spent: 35 minutes   Debbora PrestoIskra Magick-Myers, MD [facility] Pager [phone]  If 7PM-7AM, please contact night-coverage [email or web address] Password [email or web address] 11/02/2016, 5:13 PM

## 2016-11-02 NOTE — Progress Notes (Signed)
   11/02/16 1000  PT Visit Information  Last PT Received On 11/02/16  Reason Eval/Treat Not Completed Patient at procedure or test/unavailable   Pt in HD and will see later as time and pt allow.  Samul Dadauth Ameriah Lint, PT MS Acute Rehab Dept. Number: [facility] [id number] and [facility] [phone]

## 2016-11-02 NOTE — Progress Notes (Signed)
[facility] Progress Note   Subjective:  Seen prior to starting HD. Alert, oriented, seems back to baseline. Rapid response called to see pt last night per daughter's request. Concerned about altered mental status but seems fine now. No C/Os.    Objective Vitals:   11/01/16 2241 11/02/16 0159 11/02/16 0500 11/02/16 0601  BP: (!) 156/58 (!) 170/60  (!) 132/92  Pulse: 73 75  83  Resp: 16 16  17   Temp: 99.3 F (37.4 C) 98.9 F (37.2 C)  98.6 F (37 C)  TempSrc: Oral Oral  Oral  SpO2: 97% 99%  96%  Weight:   57 kg (125 lb 10.6 oz)   Height:       Physical Exam General: Awake, alert, NAD Heart: S1,S2, 2/6 systolic M Lungs: CTAB A/P Abdomen: Soft, slightly hypoactive BS.  Extremities: No LE edema Dialysis Access: LUA AVF cannulated at present.   Additional Objective Labs: Basic Metabolic Panel:  Recent Labs Lab 10/31/16 0331 11/01/16 0424 11/02/16 0547  NA 138 142 143  K 3.7 3.5 3.3*  CL 104 108 111  CO2 23 24 19*  GLUCOSE 197* 150* 141*  BUN 43* 43* 47*  CREATININE 3.34* 4.35* 5.35*  CALCIUM 9.3 9.3 8.9  PHOS 2.4* 2.9 3.8   Liver Function Tests:  Recent Labs Lab 10/29/16 1220 10/30/16 0930 11/02/16 0547  AST 17  --   --   ALT 10*  --   --   ALKPHOS 116  --   --   BILITOT 0.7  --   --   PROT 5.9*  --   --   ALBUMIN 3.1* 2.6* 2.4*    Recent Labs Lab 10/29/16 1220  LIPASE 22   CBC:  Recent Labs Lab 10/29/16 1220  10/30/16 0357 10/30/16 0930  10/31/16 0331  11/01/16 0010 11/01/16 0424 11/02/16 0547  WBC 12.4*  < > 20.4* 21.0*  --  14.3*  --   --  10.6* 9.2  NEUTROABS 11.0*  --   --   --   --   --   --   --   --   --   HGB 10.8*  < > 10.2* 9.8*  < > 8.0*  < > 7.2* 7.4* 7.6*  HCT 34.6*  < > 30.6* 28.9*  < > 24.3*  < > 22.5* 23.2* 24.4*  MCV 94.0  < > 90.5 87.8  --  91.4  --   --  93.5 94.2  PLT 122*  < > 133* 129*  --  97*  --   --  97* 119*  < > = values in this interval not displayed. Blood Culture    Component Value  Date/Time   SDES BLOOD RIGHT HAND 10/29/2016 1638   SPECREQUEST  10/29/2016 1638    BOTTLES DRAWN AEROBIC ONLY Blood Culture adequate volume   CULT NO GROWTH 3 DAYS 10/29/2016 1638   REPTSTATUS PENDING 10/29/2016 1638    Cardiac Enzymes:  Recent Labs Lab 10/29/16 1619 10/29/16 2202 10/30/16 0357  TROPONINI 0.04* 0.05* 0.08*   CBG:  Recent Labs Lab 11/01/16 0809 11/01/16 1154 11/01/16 1709 11/01/16 2013 11/02/16 0744  GLUCAP 124* 250* 141* 121* 147*   Iron Studies:  Recent Labs  11/02/16 0547  IRON 30  TIBC 143*  FERRITIN 577*   @lablastinr3 @ Studies/Results: No results found. Medications: . pantoprozole (PROTONIX) infusion 8 mg/hr (11/01/16 2337)   . amLODipine  10 mg Oral QHS  . darbepoetin (ARANESP) injection - DIALYSIS  100  mcg Intravenous Q Mon-HD  . doxercalciferol  6 mcg Intravenous Q T,Th,Sa-HD  . insulin aspart  0-5 Units Subcutaneous QHS  . insulin aspart  0-9 Units Subcutaneous TID WC  . mouth rinse  15 mL Mouth Rinse q12n4p  . metoCLOPramide (REGLAN) injection  5 mg Intravenous TID AC & HS  . [START ON 11/03/2016] pantoprazole  40 mg Intravenous Q12H  . sodium chloride flush  10-40 mL Intracatheter Q12H  . sodium chloride flush  10-40 mL Intracatheter Q12H   Dialysis Orders: [facility] T,Th,S 4 hrs 400/Auto 1.5 57.5 kg 3.0 K/2.25 Ca UF Profile 4 -Heparin: 4000 units IV TIW -Hectorol 6 mcg IV TIW (last PTH 389 10/01/16) -Mircera 50 mcg IV q 4 weeks (last dose 10/08/2016 Next dose due 11/05/16 Last HGB 10.5 10/22/16)  BMD Meds: -Senspar 60 mg PO daily No binders on OP Med list  Assessment/Plan: 1. HHNK/Hyperglycemia: BS > 700. Now on S/S insulin per primary.  2. AMS: CT of head negative. Likely metabolic issue- also high BPs and sedating meds could be contrib- at baseline very simple but alert- MS has improved 3. Abd pain/N &V: CT of Ab/pelvis negative. Per primary   4. Possible GIB: Had coffee ground emesis. HGB down to 7.6 today.  on PPI  drip per primary.  5. ESRD - T,Th,S as OP via left AVG. HD today off schedule. Short HD tomorrow to get back on T,Th,S. K+ 3.3 4.0 K bath with HD.  6. Hypertension/volume - BP labile- previous cardene gtt-now Dc'd. No evidence of volume overload on CT of abdomen or exam.resumed amlodipine 10 q HS. HD today off schedule. UFG 2 liters. Starting slightly under EDW 57 kg. 1-1.5 liters. Lower EDW on DC.  7. Anemia - HGB 10.2 6/22-Last mircera 5/31- had acute GIB  it appears, hgb down to 7.6. Repeat ESA dose today. Follow HGB.  8. Metabolic bone disease - Continue VDRA/no binder since phos is low- Sensipar IS on home med list-continue.  9. Nutrition -Albumin 2.4. Renal carb mod diet/ Add prostat/nepro/renal vit.  10. Hypokalemia- 3.3 today. On 4.0 K bath. Give one time dose of K-Dur 40 meq post HD.  11. Elevated WBC- ? etiol- per primary- no abx - trending down 9.2 today- low grade temp 99.5 last PM.   Rita H. Brown NP-C 11/02/2016, 9:08 AM  [facility] [phone]  Pt seen, examined and agree w A/P as above.  Vinson Moselle MD [employer] pager [phone]   11/02/2016, 1:33 PM

## 2016-11-03 LAB — RENAL FUNCTION PANEL
ALBUMIN: 2.5 g/dL — AB (ref 3.5–5.0)
ANION GAP: 9 (ref 5–15)
BUN: 12 mg/dL (ref 6–20)
CALCIUM: 8.3 mg/dL — AB (ref 8.9–10.3)
CO2: 27 mmol/L (ref 22–32)
Chloride: 103 mmol/L (ref 101–111)
Creatinine, Ser: 2.71 mg/dL — ABNORMAL HIGH (ref 0.44–1.00)
GFR calc non Af Amer: 18 mL/min — ABNORMAL LOW (ref >60)
GFR, EST AFRICAN AMERICAN: 21 mL/min — AB (ref >60)
Glucose, Bld: 261 mg/dL — ABNORMAL HIGH (ref 65–99)
PHOSPHORUS: 2.3 mg/dL — AB (ref 2.5–4.6)
POTASSIUM: 3.9 mmol/L (ref 3.5–5.1)
SODIUM: 139 mmol/L (ref 135–145)

## 2016-11-03 LAB — CULTURE, BLOOD (ROUTINE X 2)
CULTURE: NO GROWTH
Culture: NO GROWTH
SPECIAL REQUESTS: ADEQUATE
Special Requests: ADEQUATE

## 2016-11-03 LAB — CBC
HEMATOCRIT: 23.9 % — AB (ref 36.0–46.0)
HEMOGLOBIN: 7.5 g/dL — AB (ref 12.0–15.0)
MCH: 29.4 pg (ref 26.0–34.0)
MCHC: 31.4 g/dL (ref 30.0–36.0)
MCV: 93.7 fL (ref 78.0–100.0)
Platelets: 120 10*3/uL — ABNORMAL LOW (ref 150–400)
RBC: 2.55 MIL/uL — AB (ref 3.87–5.11)
RDW: 13.8 % (ref 11.5–15.5)
WBC: 10.1 10*3/uL (ref 4.0–10.5)

## 2016-11-03 LAB — GLUCOSE, CAPILLARY
GLUCOSE-CAPILLARY: 275 mg/dL — AB (ref 65–99)
GLUCOSE-CAPILLARY: 104 mg/dL — AB (ref 65–99)
Glucose-Capillary: 243 mg/dL — ABNORMAL HIGH (ref 65–99)

## 2016-11-03 MED ORDER — ALTEPLASE 2 MG IJ SOLR: 2.0000 mg | Freq: Once | INTRAMUSCULAR | Status: DC | PRN

## 2016-11-03 MED ORDER — HEPARIN SODIUM (PORCINE) 1000 UNIT/ML DIALYSIS: 1000.0000 [IU] | INTRAMUSCULAR | Status: DC | PRN

## 2016-11-03 MED ORDER — HEPARIN SODIUM (PORCINE) 1000 UNIT/ML DIALYSIS: 3000.0000 [IU] | Freq: Once | INTRAMUSCULAR | Status: DC

## 2016-11-03 MED ORDER — LIDOCAINE HCL (PF) 1 % IJ SOLN: 5.0000 mL | INTRAMUSCULAR | Status: DC | PRN

## 2016-11-03 MED ORDER — PENTAFLUOROPROP-TETRAFLUOROETH EX AERO: 1.0000 "application " | INHALATION_SPRAY | CUTANEOUS | Status: DC | PRN

## 2016-11-03 MED ORDER — DOXERCALCIFEROL 4 MCG/2ML IV SOLN
INTRAVENOUS | Status: AC
  Administered 2016-11-03: 6 ug via INTRAVENOUS
  Filled 2016-11-03: qty 4

## 2016-11-03 MED ORDER — RENA-VITE PO TABS: 1.0000 | ORAL_TABLET | Freq: Every day | ORAL | 0 refills | Status: AC

## 2016-11-03 MED ORDER — SODIUM CHLORIDE 0.9 % IV SOLN: 100.0000 mL | INTRAVENOUS | Status: DC | PRN

## 2016-11-03 MED ORDER — LIDOCAINE-PRILOCAINE 2.5-2.5 % EX CREA: 1.0000 "application " | TOPICAL_CREAM | CUTANEOUS | Status: DC | PRN

## 2016-11-03 MED ORDER — CINACALCET HCL 30 MG PO TABS: 60.0000 mg | ORAL_TABLET | Freq: Every day | ORAL | 0 refills | Status: AC

## 2016-11-03 NOTE — Progress Notes (Signed)
Physical Therapy Treatment Patient Details Name: Madeline [name] MRN: [id number] DOB: 1956/02/20 Today's Date: 11/03/2016    History of Present Illness 61yo female with hx ESRD, HTN, OSA, pulmonary hypertension, and CHF who presented 6/21 with c/o abd pain, n/v and AMS.  In ER she had brief seizure-like episode which improved with Ativan.  W/u was also notable for glucose >700, BP 238/97, Cr 3.4.    Patient with hypertensive urgency, uncontrolled DM, gastroparesis, acute encephalopathy, poss seizure.    PT Comments    Patient progressing with mobility including distance with ambulation, gait quality as well as safety awareness.  Did review fall prevention information for home as pt plans to d/c after HD today.  Will benefit from HHPT for safety, etc.   Follow Up Recommendations  Home health PT;Supervision for mobility/OOB     Equipment Recommendations  None recommended by PT    Recommendations for Other Services       Precautions / Restrictions Precautions Precautions: Fall    Mobility  Bed Mobility Overal bed mobility: Needs Assistance Bed Mobility: Supine to Sit;Sit to Supine     Supine to sit: Supervision     General bed mobility comments: use of rail  Transfers Overall transfer level: Needs assistance Equipment used: Rolling walker (2 wheeled) Transfers: Sit to/from Stand Sit to Stand: Supervision         General transfer comment: cues for hand placement  Ambulation/Gait Ambulation/Gait assistance: Min guard;Supervision Ambulation Distance (Feet): 100 Feet Assistive device: Rolling walker (2 wheeled) Gait Pattern/deviations: Step-through pattern     General Gait Details: used walker safely even on turns, able to get around obstacles in room with minguard and cues for safety   Stairs            Wheelchair Mobility    Modified Rankin (Stroke Patients Only)       Balance Overall balance assessment: Needs assistance   Sitting balance-Leahy Scale:  Good       Standing balance-Leahy Scale: Fair Standing balance comment: static standing able to reach for chair etc without UE support                            Cognition Arousal/Alertness: Awake/alert Behavior During Therapy: Flat affect Overall Cognitive Status: Within Functional Limits for tasks assessed                                        Exercises      General Comments General comments (skin integrity, edema, etc.): reports has ramped entry      Pertinent Vitals/Pain Pain Assessment: No/denies pain    Home Living                      Prior Function            PT Goals (current goals can now be found in the care plan section) Progress towards PT goals: Progressing toward goals    Frequency    Min 3X/week      PT Plan Current plan remains appropriate    Co-evaluation              AM-PAC PT "6 Clicks" Daily Activity  Outcome Measure  Difficulty turning over in bed (including adjusting bedclothes, sheets and blankets)?: None Difficulty moving from lying on back to sitting on the side of the  bed? : A Little Difficulty sitting down on and standing up from a chair with arms (e.g., wheelchair, bedside commode, etc,.)?: Total Help needed moving to and from a bed to chair (including a wheelchair)?: A Little Help needed walking in hospital room?: A Little Help needed climbing 3-5 steps with a railing? : A Little 6 Click Score: 17    End of Session Equipment Utilized During Treatment: Gait belt Activity Tolerance: Patient tolerated treatment well Patient left: in chair;with call bell/phone within reach   PT Visit Diagnosis: Unsteadiness on feet (R26.81);Other abnormalities of gait and mobility (R26.89);Muscle weakness (generalized) (M62.81)     Time: 1610-9604 PT Time Calculation (min) (ACUTE ONLY): 12 min  Charges:  $Gait Training: 8-22 mins                    G CodesSheran [name],  Madeline [name] 11/03/2016    Madeline [name] 11/03/2016, 2:10 PM

## 2016-11-03 NOTE — Progress Notes (Signed)
Madeline [name] to be D/C'd Home per MD order.  Discussed prescriptions and follow up appointments with the patient. Prescriptions given to patient, medication list explained in detail. Pt verbalized understanding.  Allergies as of 11/03/2016   Not on File     Medication List    STOP taking these medications   calcitRIOL 0.25 MCG capsule Commonly known as:  ROCALTROL   calcium acetate 667 MG capsule Commonly known as:  PHOSLO   Glecaprevir-Pibrentasvir 100-40 MG Tabs Commonly known as:  MAVYRET     TAKE these medications   acetaminophen 500 MG tablet Commonly known as:  TYLENOL Take 500-1,000 mg by mouth every 6 (six) hours as needed (for pain).   albuterol 108 (90 Base) MCG/ACT inhaler Commonly known as:  PROVENTIL HFA;VENTOLIN HFA Inhale 2 puffs into the lungs every 6 (six) hours as needed for wheezing or shortness of breath.   amLODipine 10 MG tablet Commonly known as:  NORVASC Take 10 mg by mouth daily.   aspirin EC 81 MG tablet Take 81 mg by mouth daily.   carvedilol 25 MG tablet Commonly known as:  COREG Take 25 mg by mouth 2 (two) times daily with a meal.   cinacalcet 30 MG tablet Commonly known as:  SENSIPAR Take 2 tablets (60 mg total) by mouth daily with supper.   dronabinol 2.5 MG capsule Commonly known as:  MARINOL Take 2.5 mg by mouth 2 (two) times daily before a meal.   insulin glargine 100 UNIT/ML injection Commonly known as:  LANTUS Inject 0.17 mLs (17 Units total) into the skin at bedtime.   insulin lispro 100 UNIT/ML injection Commonly known as:  HUMALOG Inject 3-10 Units into the skin 3 (three) times daily before meals. Per sliding scale: "BGL 200 = 3 units; 250 = 5 units; 300 = 7 units; 350 = 9-10 units"   metoCLOPramide 5 MG tablet Commonly known as:  REGLAN Take 1 tablet (5 mg total) by mouth 4 (four) times daily -  before meals and at bedtime.   mirtazapine 15 MG tablet Commonly known as:  REMERON Take 15 mg by mouth at bedtime.    multivitamin Tabs tablet Take 1 tablet by mouth at bedtime.   nitroGLYCERIN 0.4 MG SL tablet Commonly known as:  NITROSTAT PLACE ONE TABLET UNDER THE TONGUE EVERY FIVE MINUTES AS NEEDED FOR CHEST PAIN   pantoprazole 40 MG tablet Commonly known as:  PROTONIX Take 1 tablet (40 mg total) by mouth 2 (two) times daily.   polyethylene glycol packet Commonly known as:  MIRALAX / GLYCOLAX Take 17 g by mouth daily. What changed:  when to take this  reasons to take this   promethazine 25 MG tablet Commonly known as:  PHENERGAN Take 25 mg by mouth 2 (two) times daily as needed for nausea or vomiting.   ranitidine 150 MG tablet Commonly known as:  ZANTAC Take 150 mg by mouth daily as needed for heartburn.   rosuvastatin 10 MG tablet Commonly known as:  CRESTOR Take 1 tablet (10 mg total) by mouth daily.   traMADol 50 MG tablet Commonly known as:  ULTRAM Take 50 mg by mouth 2 (two) times daily as needed (for pain).       Vitals:   11/03/16 1800 11/03/16 1907  BP: (!) 138/115 (!) 144/50  Pulse: (!) 51 74  Resp:  16  Temp:  98.6 F (37 C)    Skin clean, dry and intact without evidence of skin break down, no evidence of  skin tears noted. IV catheter discontinued intact. Site without signs and symptoms of complications. Dressing and pressure applied. Pt denies pain at this time. No complaints noted.  An After Visit Summary was printed and given to the patient. Patient escorted via WC, and D/C home via private auto.  Nelma Rothman, RN [facility] Phone [phone]

## 2016-11-03 NOTE — Progress Notes (Signed)
[facility] Progress Note   Subjective: Awake, alert at baseline mental status. Ready to go home. No new complaints or issues.   Objective Vitals:   11/02/16 2012 11/02/16 2129 11/03/16 0539 11/03/16 0919  BP: (!) 138/44 (!) 147/57 127/62 (!) 133/55  Pulse: 80 (!) 111 77 79  Resp: 17 18 18 17   Temp: 98.2 F (36.8 C) 99.8 F (37.7 C) 99.3 F (37.4 C) 98.1 F (36.7 C)  TempSrc: Oral Oral Oral Oral  SpO2: 97%  100% 100%  Weight: 56.2 kg (123 lb 14.4 oz)     Height:       Physical Exam General: Pleasant, Alert, NAD Heart: S1,S2, 2/6 systolic M Lungs: CTAB A/P Abdomen: Soft, non-tender Extremities: No LE edema Dialysis Access: LUA AVF + bruit   Dialysis Orders: [facility] T,Th,S 4 hrs 400/Auto 1.5 57.5 kg 3.0 K/2.25 Ca UF Profile 4 -Heparin: 4000 units IV TIW -Hectorol 6 mcg IV TIW (last PTH 389 10/01/16) -Mircera 50 mcg IV q 4 weeks (last dose 10/08/2016 Next dose due 11/05/16 Last HGB 10.5 10/22/16)  BMD Meds: -Senspar 60 mg PO daily No binders on OP Med list  Assessment/Plan: 1. HHNK/Hyperglycemia: BS >700. Now on S/S insulin per primary. BS 178-252.  2. AMS: CT of head negative. Likely metabolic issue- also high BPs and sedating meds could contributing. Now back to baseline, wanting to go home.  3. Abd pain/N &V: CT of Ab/pelvis negative. Per primary  4. Possible GIB: Had coffee ground emesis. HGB down to 7.5 today.  on PPI drip per primary.  5. ESRD - T,Th,S as OP via left AVF.  Short HD today to get back on T,Th,S. K+ 3.9, 3.0 K bath with HD.  6. Hypertension/volume - BP better controlled. HD yesterday Pre wt 58.6 kg Net UF 1.5 Post wt 56.1 kg. Attempt 0.5-1 liter with HD today. Lower EDW on DC.  7. Anemia - HGB 10.2 6/22-Last mircera 5/31- had acute GIB it appears, hgb down to 7.5. Repeat ESA dose today. Follow HGB.  8. Metabolic bone disease - Continue VDRA/Phos 2.3-hold binders. Sensipar IS on home med list-continue. Ca 8.3 C Ca 9.5.   9. Nutrition -Albumin 2.4. Renal carb mod diet/ Add prostat/nepro/renal vit.  10. Hypokalemia- 3.9 today. 3.0 K bath with HD.  11. Elevated WBC- no abx - trending down 10.1 today- Tmax 100.6 last PM. Per primary  Rita H. Brown NP-C 11/03/2016, 9:43 AM  [facility] [phone]  Pt seen, examined and agree w A/P as above.  Madeline Moselleob Danh Bayus MD [facility] pager [phone]   11/03/2016, 3:40 PM     Additional Objective Labs: Basic Metabolic Panel:  Recent Labs Lab 11/01/16 0424 11/02/16 0547 11/03/16 0500  NA 142 143 139  K 3.5 3.3* 3.9  CL 108 111 103  CO2 24 19* 27  GLUCOSE 150* 141* 261*  BUN 43* 47* 12  CREATININE 4.35* 5.35* 2.71*  CALCIUM 9.3 8.9 8.3*  PHOS 2.9 3.8 2.3*   Liver Function Tests:  Recent Labs Lab 10/29/16 1220 10/30/16 0930 11/02/16 0547 11/03/16 0500  AST 17  --   --   --   ALT 10*  --   --   --   ALKPHOS 116  --   --   --   BILITOT 0.7  --   --   --   PROT 5.9*  --   --   --   ALBUMIN 3.1* 2.6* 2.4* 2.5*    Recent Labs  Lab 10/29/16 1220  LIPASE 22   CBC:  Recent Labs Lab 10/29/16 1220  10/30/16 0930  10/31/16 0331  11/01/16 0424 11/02/16 0547 11/03/16 0500  WBC 12.4*  < > 21.0*  --  14.3*  --  10.6* 9.2 10.1  NEUTROABS 11.0*  --   --   --   --   --   --   --   --   HGB 10.8*  < > 9.8*  < > 8.0*  < > 7.4* 7.6* 7.5*  HCT 34.6*  < > 28.9*  < > 24.3*  < > 23.2* 24.4* 23.9*  MCV 94.0  < > 87.8  --  91.4  --  93.5 94.2 93.7  PLT 122*  < > 129*  --  97*  --  97* 119* 120*  < > = values in this interval not displayed. Blood Culture    Component Value Date/Time   SDES BLOOD RIGHT HAND 10/29/2016 1638   SPECREQUEST  10/29/2016 1638    BOTTLES DRAWN AEROBIC ONLY Blood Culture adequate volume   CULT NO GROWTH 4 DAYS 10/29/2016 1638   REPTSTATUS PENDING 10/29/2016 1638    Cardiac Enzymes:  Recent Labs Lab 10/29/16 1619 10/29/16 2202 10/30/16 0357  TROPONINI 0.04* 0.05* 0.08*    CBG:  Recent Labs Lab 11/02/16 0744 11/02/16 1405 11/02/16 1627 11/02/16 2005 11/03/16 0729  GLUCAP 147* 115* 178* 252* 243*   Iron Studies:  Recent Labs  11/02/16 0547  IRON 30  TIBC 143*  FERRITIN 577*   @[email or web address] @ Studies/Results: No results found. Medications:  . amLODipine  10 mg Oral QHS  . cinacalcet  60 mg Oral Q supper  . darbepoetin (ARANESP) injection - DIALYSIS  100 mcg Intravenous Q Mon-HD  . doxercalciferol  6 mcg Intravenous Q T,Th,Sa-HD  . feeding supplement (NEPRO CARB STEADY)  237 mL Oral BID BM  . feeding supplement (PRO-STAT SUGAR FREE 64)  30 mL Oral BID  . insulin aspart  0-5 Units Subcutaneous QHS  . insulin aspart  0-9 Units Subcutaneous TID WC  . mouth rinse  15 mL Mouth Rinse q12n4p  . metoCLOPramide (REGLAN) injection  5 mg Intravenous TID AC & HS  . multivitamin  1 tablet Oral QHS  . pantoprazole  40 mg Intravenous Q12H  . sodium chloride flush  10-40 mL Intracatheter Q12H  . sodium chloride flush  10-40 mL Intracatheter Q12H

## 2016-11-03 NOTE — Progress Notes (Signed)
Inpatient Diabetes Program Recommendations  AACE/ADA: New Consensus Statement on Inpatient Glycemic Control (2015)  Target Ranges:  Prepandial:   less than 140 mg/dL      Peak postprandial:   less than 180 mg/dL (1-2 hours)      Critically ill patients:  140 - 180 mg/dL   Results for [name], Madeline [name] (MRN [id number]) as of 11/03/2016 11:44  Ref. Range 11/03/2016 07:29 11/03/2016 11:31  Glucose-Capillary Latest Ref Range: 65 - 99 mg/dL 409243 (H) 811275 (H)    Home DM Meds: Lantus 17 units QHS       Humalog 3-10 units TID per SSI  Current Insulin Orders: Novolog Sensitive Correction Scale/ SSI (0-9 units) TID AC + HS     MD- Please consider the following in-hospital insulin adjustments:  1. Start Lantus 10 units QHS (~2/3 total home dose)  2. Start low dose Novolog Meal Coverage: Novolog 3 units TID with meals (hold if pt eats <50% of meal)     --Will follow patient during hospitalization--  Ambrose FinlandJeannine Johnston Dasia Guerrier RN, MSN, CDE Diabetes Coordinator Inpatient Glycemic Control Team Team Pager: [phone] (8a-5p)

## 2016-11-03 NOTE — Discharge Instructions (Signed)

## 2016-11-03 NOTE — Progress Notes (Addendum)
Patient voiced want to go home. POC is for patient to DC after HD treatment today. There is a DC order but HD staff stated they are "backed up" and are unsure what time they will be able to dialyze patient.    This RN called pt's dialysis center to inquire if the pt was able to attend her session tomorrow 11/04/16. The HD center stated they were full and unable to dialyze her on 11/04/16.   Pt is upset that she has to "wait this long".  1500 Notified MD Izola PriceMyers and NP Manson PasseyBrown.  NP Manson PasseyBrown talked to pt and pt has calmed down. Pt is on her way to HD.      Will continue to monitor

## 2016-11-03 NOTE — Discharge Summary (Signed)
Physician Discharge Summary  Madeline [name] ZHY:[id number] DOB: [birth year] DOA: 10/29/2016  PCP: Barbie BannerWilson, Madeline H, MD  Admit date: 10/29/2016 Discharge date: 11/03/2016  Recommendations for Outpatient Follow-up:  1. Pt will need to follow up with PCP in 1-2 weeks post discharge  Discharge Diagnoses:  Active Problems:   Altered mental status   Hyperosmolar non-ketotic state in patient with type 2 diabetes mellitus [facility](HCC)   Hypertensive emergency  Discharge Condition: Stable  Diet recommendation: renal diet  History of present illness:  61yo female with hx ESRD, HTN, OSA, pulmonary hypertension, and CHF who presented 6/21 with c/o abd pain, n/v and AMS. In ER she had brief seizure-like episode which improved with Ativan. W/u was also notable for glucose >700, BP 238/97, Cr 3.4. She was started on cardene gtt and PCCM called for ICU admission.  Significant Events: 6/21 admit w/ HTN crisis, HONK, and AMS 6/24 [facility] assumed care   Assessment & Plan:   HTN emergency - better control  - had HD, SBP in 140's - continue Norvasc and Coreg   Elevated troponin - suspected to be related to demands ischemia in the setting of the above - no chest pain, no need to cycle troponins - pt wants to go home   HONK  - in the setting on uncontrolled DM with complications of ESRD - resume home medical regimen   Thrombocytopenia - mild, reactive - can be followed up in an outpatient setting   Hypokalemia - resolved   Abdom cramping/pain - ? Gastroparesis in the setting of uncontrolled DM - resolved   Acute encephalopathy - metabolic, from hypertensive urgency and HONK - resolved  - pt wants to go home and nephrology team has cleared for discharge   Possible seizure - likely related to acute severe metabolic derangements at time of admission - CT head and EEG unremarkable  - mental status at baseline   Transient coffee ground emesis - CT abd negative - GI team has seen and  did not think intervention is needed unless bleeding recurs  - Hg overall stable, no evidence of active bleeding   ESRD on T/Th/Sat - Nephrology following and attending to dialysis  Grade 2 Diastolic CHF - volume control with HD  DVT prophylaxis:SCDs Code Status:DO NOT INTUBATE Family Communication:no family present at time of exam  Disposition Plan:home   Consultants:  [facility]M Nephrology   Antimicrobials: none   Procedures/Studies: Ct Abdomen Pelvis Wo Contrast  Result Date: 10/29/2016 CLINICAL DATA:  Seizures today. Vomiting. History of diabetes, end-stage renal disease on dialysis, peptic ulcer disease. EXAM: CT ABDOMEN AND PELVIS WITHOUT CONTRAST TECHNIQUE: Multidetector CT imaging of the abdomen and pelvis was performed following the standard protocol without IV contrast. COMPARISON:  CT abdomen and pelvis February 07, 2017 FINDINGS: LOWER CHEST: Lung bases are clear. The heart size is mildly enlarged. Mild coronary artery calcifications. No pericardial effusion. HEPATOBILIARY: Normal. PANCREAS: Atrophic pancreas. SPLEEN: Normal. ADRENALS/URINARY TRACT: Kidneys are orthotopic, demonstrating normal size and morphology. No nephrolithiasis, hydronephrosis; limited assessment for renal masses on this nonenhanced examination. The unopacified ureters are normal in course and caliber. Urinary bladder is partially distended and unremarkable. Normal adrenal glands. STOMACH/BOWEL: Small hiatal hernia with fluid distended esophagus. Mild fluid distended stomach. Small air-filled gastric antral probable diverticulum. The small and large bowel are normal in course and caliber without inflammatory changes, sensitivity decreased by lack of enteric contrast. Normal appendix. VASCULAR/LYMPHATIC: Aortoiliac vessels are normal in course and caliber, moderate calcific atherosclerosis severe small vessel  calcifications seen with end-stage renal disease. No lymphadenopathy by CT size  criteria though sensitivity decreased without contrast. REPRODUCTIVE: Status post hysterectomy. OTHER: No intraperitoneal free fluid or free air. MUSCULOSKELETAL: Mild anasarca. 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 Small fat containing inguinal hernias and, umbilical hernia. Severe L5-S1 degenerative disc. Large L2 and L4 Schmorl's nodes. IMPRESSION: Fluid distended stomach may be transient or, sequelae of gastric paresis. No acute intra-abdominal or pelvic process by noncontrast CT. Mild anasarca. Electronically Signed   By: Awilda Metro M.D.   On: 10/29/2016 14:59   Ct Head Wo Contrast  Result Date: 10/29/2016 CLINICAL DATA:  61 year old female with seizure today. EXAM: CT HEAD WITHOUT CONTRAST TECHNIQUE: Contiguous axial images were obtained from the base of the skull through the vertex without intravenous contrast. COMPARISON:  02/09/2016 head CT and prior studies FINDINGS: Brain: No evidence of acute infarction, hemorrhage, hydrocephalus, extra-axial collection or mass lesion/mass effect. Mild chronic small-vessel white matter ischemic changes again noted. Vascular: Intracranial atherosclerotic calcifications identified. Skull: Normal. Negative for fracture or focal lesion. Sinuses/Orbits: No acute finding. Other: None. IMPRESSION: No evidence of acute intracranial abnormality. Mild chronic small-vessel white matter ischemic changes. Electronically Signed   By: Harmon Pier M.D.   On: 10/29/2016 14:55   Dg Chest Port 1 View  Result Date: 10/30/2016 CLINICAL DATA:  Central line placement.  Initial encounter. EXAM: PORTABLE CHEST 1 VIEW COMPARISON:  Chest radiograph performed 02/09/2016 FINDINGS: A left IJ line is noted ending about the mid to distal SVC. The lungs appear clear bilaterally. No focal consolidation, pleural effusion or pneumothorax is seen. The cardiomediastinal silhouette is borderline normal in size. No acute osseous abnormalities are identified.  IMPRESSION: 1. Left IJ line noted ending about the mid to distal SVC. 2. No acute cardiopulmonary process seen. Electronically Signed   By: Roanna Raider M.D.   On: 10/30/2016 19:39     Discharge Exam: Vitals:   11/03/16 0539 11/03/16 0919  BP: 127/62 (!) 133/55  Pulse: 77 79  Resp: 18 17  Temp: 99.3 F (37.4 C) 98.1 F (36.7 C)   Vitals:   11/02/16 2012 11/02/16 2129 11/03/16 0539 11/03/16 0919  BP: (!) 138/44 (!) 147/57 127/62 (!) 133/55  Pulse: 80 (!) 111 77 79  Resp: 17 18 18 17   Temp: 98.2 F (36.8 C) 99.8 F (37.7 C) 99.3 F (37.4 C) 98.1 F (36.7 C)  TempSrc: Oral Oral Oral Oral  SpO2: 97%  100% 100%  Weight: 56.2 kg (123 lb 14.4 oz)     Height:        General: Pt is alert, follows commands appropriately, not in acute distress Cardiovascular: Regular rate and rhythm, S1/S2 +, no murmurs, no rubs, no gallops Respiratory: Clear to auscultation bilaterally, no wheezing, no crackles, no rhonchi Abdominal: Soft, non tender, non distended, bowel sounds +, no guarding  Discharge Instructions  Discharge Instructions    Diet - low sodium heart healthy    Complete by:  As directed    Increase activity slowly    Complete by:  As directed      Allergies as of 11/03/2016   Not on File     Medication List    STOP taking these medications   calcitRIOL 0.25 MCG capsule Commonly known as:  ROCALTROL   calcium acetate 667 MG capsule Commonly known as:  PHOSLO   Glecaprevir-Pibrentasvir 100-40 MG Tabs Commonly known as:  MAVYRET     TAKE these medications   acetaminophen 500 MG  tablet Commonly known as:  TYLENOL Take 500-1,000 mg by mouth every 6 (six) hours as needed (for pain).   albuterol 108 (90 Base) MCG/ACT inhaler Commonly known as:  PROVENTIL HFA;VENTOLIN HFA Inhale 2 puffs into the lungs every 6 (six) hours as needed for wheezing or shortness of breath.   amLODipine 10 MG tablet Commonly known as:  NORVASC Take 10 mg by mouth daily.   aspirin EC  81 MG tablet Take 81 mg by mouth daily.   carvedilol 25 MG tablet Commonly known as:  COREG Take 25 mg by mouth 2 (two) times daily with a meal.   cinacalcet 30 MG tablet Commonly known as:  SENSIPAR Take 2 tablets (60 mg total) by mouth daily with supper.   dronabinol 2.5 MG capsule Commonly known as:  MARINOL Take 2.5 mg by mouth 2 (two) times daily before a meal.   insulin glargine 100 UNIT/ML injection Commonly known as:  LANTUS Inject 0.17 mLs (17 Units total) into the skin at bedtime.   insulin lispro 100 UNIT/ML injection Commonly known as:  HUMALOG Inject 3-10 Units into the skin 3 (three) times daily before meals. Per sliding scale: "BGL 200 = 3 units; 250 = 5 units; 300 = 7 units; 350 = 9-10 units"   metoCLOPramide 5 MG tablet Commonly known as:  REGLAN Take 1 tablet (5 mg total) by mouth 4 (four) times daily -  before meals and at bedtime.   mirtazapine 15 MG tablet Commonly known as:  REMERON Take 15 mg by mouth at bedtime.   multivitamin Tabs tablet Take 1 tablet by mouth at bedtime.   nitroGLYCERIN 0.4 MG SL tablet Commonly known as:  NITROSTAT PLACE ONE TABLET UNDER THE TONGUE EVERY FIVE MINUTES AS NEEDED FOR CHEST PAIN   pantoprazole 40 MG tablet Commonly known as:  PROTONIX Take 1 tablet (40 mg total) by mouth 2 (two) times daily.   polyethylene glycol packet Commonly known as:  MIRALAX / GLYCOLAX Take 17 g by mouth daily. What changed:  when to take this  reasons to take this   promethazine 25 MG tablet Commonly known as:  PHENERGAN Take 25 mg by mouth 2 (two) times daily as needed for nausea or vomiting.   ranitidine 150 MG tablet Commonly known as:  ZANTAC Take 150 mg by mouth daily as needed for heartburn.   rosuvastatin 10 MG tablet Commonly known as:  CRESTOR Take 1 tablet (10 mg total) by mouth daily.   traMADol 50 MG tablet Commonly known as:  ULTRAM Take 50 mg by mouth 2 (two) times daily as needed (for pain).       Follow-up Information    Barbie Banner, MD Follow up.   Specialty:  Family Medicine Contact information: [address] [place] [address]            The results of significant diagnostics from this hospitalization (including imaging, microbiology, ancillary and laboratory) are listed below for reference.     Microbiology: Recent Results (from the past 240 hour(s))  Culture, blood (Routine X 2) w Reflex to ID Panel     Status: None (Preliminary result)   Collection Time: 10/29/16  4:30 PM  Result Value Ref Range Status   Specimen Description BLOOD RIGHT ANTECUBITAL  Final   Special Requests   Final    BOTTLES DRAWN AEROBIC AND ANAEROBIC Blood Culture adequate volume   Culture NO GROWTH 4 DAYS  Final   Report Status PENDING  Incomplete  Culture, blood (Routine X 2) w Reflex to ID Panel     Status: None (Preliminary result)   Collection Time: 10/29/16  4:38 PM  Result Value Ref Range Status   Specimen Description BLOOD RIGHT HAND  Final   Special Requests   Final    BOTTLES DRAWN AEROBIC ONLY Blood Culture adequate volume   Culture NO GROWTH 4 DAYS  Final   Report Status PENDING  Incomplete  MRSA PCR Screening     Status: None   Collection Time: 10/29/16  6:14 PM  Result Value Ref Range Status   MRSA by PCR NEGATIVE NEGATIVE Final    Comment:        The GeneXpert MRSA Assay (FDA approved for NASAL specimens only), is one component of a comprehensive MRSA colonization surveillance program. It is not intended to diagnose MRSA infection nor to guide or monitor treatment for MRSA infections.      Labs: Basic Metabolic Panel:  Recent Labs Lab 10/30/16 0930 10/31/16 0331 11/01/16 0424 11/02/16 0547 11/03/16 0500  NA 140 138 142 143 139  K 3.4* 3.7 3.5 3.3* 3.9  CL 106 104 108 111 103  CO2 25 23 24  19* 27  GLUCOSE 106* 197* 150* 141* 261*  BUN 101* 43* 43* 47* 12  CREATININE 4.25* 3.34* 4.35* 5.35* 2.71*  CALCIUM 10.5* 9.3 9.3 8.9 8.3*  MG  --   1.8 2.5*  --   --   PHOS 1.9* 2.4* 2.9 3.8 2.3*   Liver Function Tests:  Recent Labs Lab 10/29/16 1220 10/30/16 0930 11/02/16 0547 11/03/16 0500  AST 17  --   --   --   ALT 10*  --   --   --   ALKPHOS 116  --   --   --   BILITOT 0.7  --   --   --   PROT 5.9*  --   --   --   ALBUMIN 3.1* 2.6* 2.4* 2.5*    Recent Labs Lab 10/29/16 1220  LIPASE 22    Recent Labs Lab 10/30/16 1526  AMMONIA 11   CBC:  Recent Labs Lab 10/29/16 1220  10/30/16 0930  10/31/16 0331  10/31/16 1851 11/01/16 0010 11/01/16 0424 11/02/16 0547 11/03/16 0500  WBC 12.4*  < > 21.0*  --  14.3*  --   --   --  10.6* 9.2 10.1  NEUTROABS 11.0*  --   --   --   --   --   --   --   --   --   --   HGB 10.8*  < > 9.8*  < > 8.0*  < > 7.4* 7.2* 7.4* 7.6* 7.5*  HCT 34.6*  < > 28.9*  < > 24.3*  < > 22.6* 22.5* 23.2* 24.4* 23.9*  MCV 94.0  < > 87.8  --  91.4  --   --   --  93.5 94.2 93.7  PLT 122*  < > 129*  --  97*  --   --   --  97* 119* 120*  < > = values in this interval not displayed. Cardiac Enzymes:  Recent Labs Lab 10/29/16 1619 10/29/16 2202 10/30/16 0357  TROPONINI 0.04* 0.05* 0.08*   CBG:  Recent Labs Lab 11/02/16 1405 11/02/16 1627 11/02/16 2005 11/03/16 0729 11/03/16 1131  GLUCAP 115* 178* 252* 243* 275*     SIGNED: Time coordinating discharge: 45 minutes  Debbora Presto, MD  [facility] 11/03/2016, 11:46 AM Pager [phone]  If 7PM-7AM, please contact night-coverage [email or web address] Password [email or web address]

## 2016-12-09 DIAGNOSIS — R569 Unspecified convulsions: Secondary | ICD-10-CM

## 2016-12-09 HISTORY — DX: Unspecified convulsions: R56.9

## 2016-12-24 ENCOUNTER — Emergency Department (HOSPITAL_COMMUNITY): Payer: Medicaid Other

## 2016-12-24 ENCOUNTER — Encounter (HOSPITAL_COMMUNITY): Payer: Self-pay

## 2016-12-24 ENCOUNTER — Inpatient Hospital Stay (HOSPITAL_COMMUNITY)
Admission: EM | Admit: 2016-12-24 | Discharge: 2016-12-24 | DRG: 100 | Discharge status: Against Medical Advice | Payer: Medicaid Other | Attending: Family Medicine | Admitting: Family Medicine

## 2016-12-24 DIAGNOSIS — I2510 Atherosclerotic heart disease of native coronary artery without angina pectoris: Secondary | ICD-10-CM | POA: Diagnosis present

## 2016-12-24 DIAGNOSIS — I132 Hypertensive heart and chronic kidney disease with heart failure and with stage 5 chronic kidney disease, or end stage renal disease: Secondary | ICD-10-CM | POA: Diagnosis present

## 2016-12-24 DIAGNOSIS — Z79899 Other long term (current) drug therapy: Secondary | ICD-10-CM

## 2016-12-24 DIAGNOSIS — J449 Chronic obstructive pulmonary disease, unspecified: Secondary | ICD-10-CM | POA: Diagnosis present

## 2016-12-24 DIAGNOSIS — F17220 Nicotine dependence, chewing tobacco, uncomplicated: Secondary | ICD-10-CM | POA: Diagnosis present

## 2016-12-24 DIAGNOSIS — R569 Unspecified convulsions: Secondary | ICD-10-CM | POA: Diagnosis present

## 2016-12-24 DIAGNOSIS — E1122 Type 2 diabetes mellitus with diabetic chronic kidney disease: Secondary | ICD-10-CM | POA: Diagnosis present

## 2016-12-24 DIAGNOSIS — F39 Unspecified mood [affective] disorder: Secondary | ICD-10-CM | POA: Diagnosis present

## 2016-12-24 DIAGNOSIS — Z8673 Personal history of transient ischemic attack (TIA), and cerebral infarction without residual deficits: Secondary | ICD-10-CM

## 2016-12-24 DIAGNOSIS — Z833 Family history of diabetes mellitus: Secondary | ICD-10-CM

## 2016-12-24 DIAGNOSIS — Z7982 Long term (current) use of aspirin: Secondary | ICD-10-CM

## 2016-12-24 DIAGNOSIS — N186 End stage renal disease: Secondary | ICD-10-CM | POA: Diagnosis present

## 2016-12-24 DIAGNOSIS — E8889 Other specified metabolic disorders: Secondary | ICD-10-CM | POA: Diagnosis present

## 2016-12-24 DIAGNOSIS — G4733 Obstructive sleep apnea (adult) (pediatric): Secondary | ICD-10-CM | POA: Diagnosis present

## 2016-12-24 DIAGNOSIS — I252 Old myocardial infarction: Secondary | ICD-10-CM | POA: Diagnosis not present

## 2016-12-24 DIAGNOSIS — Z90710 Acquired absence of both cervix and uterus: Secondary | ICD-10-CM

## 2016-12-24 DIAGNOSIS — E7800 Pure hypercholesterolemia, unspecified: Secondary | ICD-10-CM | POA: Diagnosis present

## 2016-12-24 DIAGNOSIS — Z794 Long term (current) use of insulin: Secondary | ICD-10-CM

## 2016-12-24 DIAGNOSIS — Z9889 Other specified postprocedural states: Secondary | ICD-10-CM

## 2016-12-24 DIAGNOSIS — Z8249 Family history of ischemic heart disease and other diseases of the circulatory system: Secondary | ICD-10-CM | POA: Diagnosis not present

## 2016-12-24 DIAGNOSIS — Z992 Dependence on renal dialysis: Secondary | ICD-10-CM | POA: Diagnosis not present

## 2016-12-24 DIAGNOSIS — I5032 Chronic diastolic (congestive) heart failure: Secondary | ICD-10-CM | POA: Diagnosis present

## 2016-12-24 DIAGNOSIS — I10 Essential (primary) hypertension: Secondary | ICD-10-CM | POA: Diagnosis not present

## 2016-12-24 DIAGNOSIS — K219 Gastro-esophageal reflux disease without esophagitis: Secondary | ICD-10-CM | POA: Diagnosis present

## 2016-12-24 DIAGNOSIS — E1165 Type 2 diabetes mellitus with hyperglycemia: Secondary | ICD-10-CM | POA: Diagnosis present

## 2016-12-24 DIAGNOSIS — R739 Hyperglycemia, unspecified: Secondary | ICD-10-CM

## 2016-12-24 DIAGNOSIS — Z8661 Personal history of infections of the central nervous system: Secondary | ICD-10-CM

## 2016-12-24 DIAGNOSIS — Z9851 Tubal ligation status: Secondary | ICD-10-CM

## 2016-12-24 LAB — CBC WITH DIFFERENTIAL/PLATELET
BASOS ABS: 0 10*3/uL (ref 0.0–0.1)
Basophils Relative: 0 %
EOS PCT: 1 %
Eosinophils Absolute: 0 10*3/uL (ref 0.0–0.7)
HCT: 39.8 % (ref 36.0–46.0)
Hemoglobin: 12.5 g/dL (ref 12.0–15.0)
LYMPHS PCT: 22 %
Lymphs Abs: 1.3 10*3/uL (ref 0.7–4.0)
MCH: 28.2 pg (ref 26.0–34.0)
MCHC: 31.4 g/dL (ref 30.0–36.0)
MCV: 89.8 fL (ref 78.0–100.0)
Monocytes Absolute: 0.3 10*3/uL (ref 0.1–1.0)
Monocytes Relative: 5 %
Neutro Abs: 4.4 10*3/uL (ref 1.7–7.7)
Neutrophils Relative %: 72 %
PLATELETS: 132 10*3/uL — AB (ref 150–400)
RBC: 4.43 MIL/uL (ref 3.87–5.11)
RDW: 15 % (ref 11.5–15.5)
WBC: 6 10*3/uL (ref 4.0–10.5)

## 2016-12-24 LAB — BASIC METABOLIC PANEL
ANION GAP: 12 (ref 5–15)
BUN: 43 mg/dL — ABNORMAL HIGH (ref 6–20)
CO2: 23 mmol/L (ref 22–32)
Calcium: 7.8 mg/dL — ABNORMAL LOW (ref 8.9–10.3)
Chloride: 95 mmol/L — ABNORMAL LOW (ref 101–111)
Creatinine, Ser: 6.63 mg/dL — ABNORMAL HIGH (ref 0.44–1.00)
GFR, EST AFRICAN AMERICAN: 7 mL/min — AB (ref >60)
GFR, EST NON AFRICAN AMERICAN: 6 mL/min — AB (ref >60)
Glucose, Bld: 588 mg/dL (ref 65–99)
POTASSIUM: 4.7 mmol/L (ref 3.5–5.1)
SODIUM: 130 mmol/L — AB (ref 135–145)

## 2016-12-24 LAB — HEPATIC FUNCTION PANEL
ALT: 13 U/L — ABNORMAL LOW (ref 14–54)
AST: 28 U/L (ref 15–41)
Albumin: 3.1 g/dL — ABNORMAL LOW (ref 3.5–5.0)
Alkaline Phosphatase: 217 U/L — ABNORMAL HIGH (ref 38–126)
BILIRUBIN DIRECT: 0.1 mg/dL (ref 0.1–0.5)
BILIRUBIN TOTAL: 0.5 mg/dL (ref 0.3–1.2)
Indirect Bilirubin: 0.4 mg/dL (ref 0.3–0.9)
Total Protein: 6.2 g/dL — ABNORMAL LOW (ref 6.5–8.1)

## 2016-12-24 LAB — PHOSPHORUS: Phosphorus: 6.3 mg/dL — ABNORMAL HIGH (ref 2.5–4.6)

## 2016-12-24 LAB — I-STAT BETA HCG BLOOD, ED (MC, WL, AP ONLY): I-stat hCG, quantitative: <5 m[IU]/mL (ref <5)

## 2016-12-24 LAB — MAGNESIUM: MAGNESIUM: 2.3 mg/dL (ref 1.7–2.4)

## 2016-12-24 MED ORDER — ACETAMINOPHEN 325 MG PO TABS
650.0000 mg | ORAL_TABLET | Freq: Once | ORAL | Status: AC
  Administered 2016-12-24: 650 mg via ORAL
  Filled 2016-12-24: qty 2

## 2016-12-24 MED ORDER — ACETAMINOPHEN 325 MG PO TABS
650.0000 mg | ORAL_TABLET | Freq: Four times a day (QID) | ORAL | Status: DC | PRN
  Administered 2016-12-24: 650 mg via ORAL

## 2016-12-24 MED ORDER — SODIUM CHLORIDE 0.9 % IV SOLN: INTRAVENOUS | Status: DC

## 2016-12-24 MED ORDER — ROSUVASTATIN CALCIUM 10 MG PO TABS
10.0000 mg | ORAL_TABLET | Freq: Every day | ORAL | Status: DC
  Filled 2016-12-24: qty 1

## 2016-12-24 MED ORDER — DOXERCALCIFEROL 4 MCG/2ML IV SOLN: 7.0000 ug | INTRAVENOUS | Status: DC

## 2016-12-24 MED ORDER — CINACALCET HCL 30 MG PO TABS
60.0000 mg | ORAL_TABLET | Freq: Every day | ORAL | Status: DC
  Filled 2016-12-24: qty 2

## 2016-12-24 MED ORDER — HYDRALAZINE HCL 20 MG/ML IJ SOLN: 10.0000 mg | Freq: Three times a day (TID) | INTRAMUSCULAR | Status: DC | PRN

## 2016-12-24 MED ORDER — AMLODIPINE BESYLATE 5 MG PO TABS: 10.0000 mg | ORAL_TABLET | Freq: Every day | ORAL | Status: DC

## 2016-12-24 MED ORDER — SODIUM CHLORIDE 0.9 % IV SOLN
INTRAVENOUS | Status: DC
  Filled 2016-12-24: qty 1

## 2016-12-24 MED ORDER — LIDOCAINE HCL (PF) 1 % IJ SOLN: 5.0000 mL | INTRAMUSCULAR | Status: DC | PRN

## 2016-12-24 MED ORDER — SODIUM CHLORIDE 0.9 % IV SOLN: 100.0000 mL | INTRAVENOUS | Status: DC | PRN

## 2016-12-24 MED ORDER — FAMOTIDINE 20 MG PO TABS: 20.0000 mg | ORAL_TABLET | Freq: Every day | ORAL | Status: DC

## 2016-12-24 MED ORDER — NEPRO/CARBSTEADY PO LIQD
237.0000 mL | Freq: Two times a day (BID) | ORAL | Status: DC
  Filled 2016-12-24 (×3): qty 237

## 2016-12-24 MED ORDER — ACETAMINOPHEN 650 MG RE SUPP: 650.0000 mg | Freq: Four times a day (QID) | RECTAL | Status: DC | PRN

## 2016-12-24 MED ORDER — LIDOCAINE-PRILOCAINE 2.5-2.5 % EX CREA: 1.0000 "application " | TOPICAL_CREAM | CUTANEOUS | Status: DC | PRN

## 2016-12-24 MED ORDER — PROMETHAZINE HCL 25 MG PO TABS: 12.5000 mg | ORAL_TABLET | Freq: Four times a day (QID) | ORAL | Status: DC | PRN

## 2016-12-24 MED ORDER — HEPARIN SODIUM (PORCINE) 1000 UNIT/ML DIALYSIS
1000.0000 [IU] | INTRAMUSCULAR | Status: DC | PRN
  Filled 2016-12-24: qty 1

## 2016-12-24 MED ORDER — CARVEDILOL 12.5 MG PO TABS: 25.0000 mg | ORAL_TABLET | Freq: Two times a day (BID) | ORAL | Status: DC

## 2016-12-24 MED ORDER — MIRTAZAPINE 15 MG PO TABS: 15.0000 mg | ORAL_TABLET | Freq: Every day | ORAL | Status: DC

## 2016-12-24 MED ORDER — PENTAFLUOROPROP-TETRAFLUOROETH EX AERO: 1.0000 "application " | INHALATION_SPRAY | CUTANEOUS | Status: DC | PRN

## 2016-12-24 MED ORDER — PANTOPRAZOLE SODIUM 40 MG PO TBEC: 40.0000 mg | DELAYED_RELEASE_TABLET | Freq: Two times a day (BID) | ORAL | Status: DC

## 2016-12-24 MED ORDER — NITROGLYCERIN 0.4 MG SL SUBL: 0.4000 mg | SUBLINGUAL_TABLET | SUBLINGUAL | Status: DC | PRN

## 2016-12-24 MED ORDER — SEVELAMER CARBONATE 800 MG PO TABS: 2400.0000 mg | ORAL_TABLET | Freq: Three times a day (TID) | ORAL | Status: DC

## 2016-12-24 MED ORDER — SODIUM CHLORIDE 0.9 % IV SOLN: 125.0000 mg | INTRAVENOUS | Status: DC

## 2016-12-24 MED ORDER — HEPARIN SODIUM (PORCINE) 5000 UNIT/ML IJ SOLN: 5000.0000 [IU] | Freq: Three times a day (TID) | INTRAMUSCULAR | Status: DC

## 2016-12-24 MED ORDER — ACETAMINOPHEN 325 MG PO TABS
ORAL_TABLET | ORAL | Status: AC
  Filled 2016-12-24: qty 2

## 2016-12-24 MED ORDER — LORAZEPAM 2 MG/ML IJ SOLN: 1.0000 mg | Freq: Four times a day (QID) | INTRAMUSCULAR | Status: DC | PRN

## 2016-12-24 MED ORDER — ALTEPLASE 2 MG IJ SOLR
2.0000 mg | Freq: Once | INTRAMUSCULAR | Status: DC | PRN
  Filled 2016-12-24: qty 2

## 2016-12-24 MED ORDER — RENA-VITE PO TABS: 1.0000 | ORAL_TABLET | Freq: Every day | ORAL | Status: DC

## 2016-12-24 MED ORDER — INSULIN REGULAR BOLUS VIA INFUSION
0.0000 [IU] | Freq: Three times a day (TID) | INTRAVENOUS | Status: DC
  Filled 2016-12-24: qty 10

## 2016-12-24 MED ORDER — ALBUTEROL SULFATE (2.5 MG/3ML) 0.083% IN NEBU: 3.0000 mL | INHALATION_SOLUTION | Freq: Four times a day (QID) | RESPIRATORY_TRACT | Status: DC | PRN

## 2016-12-24 MED ORDER — DEXTROSE-NACL 5-0.45 % IV SOLN: INTRAVENOUS | Status: DC

## 2016-12-24 MED ORDER — ASPIRIN EC 81 MG PO TBEC: 81.0000 mg | DELAYED_RELEASE_TABLET | Freq: Every day | ORAL | Status: DC

## 2016-12-24 MED ORDER — DEXTROSE 50 % IV SOLN: 25.0000 mL | INTRAVENOUS | Status: DC | PRN

## 2016-12-24 NOTE — Consult Note (Signed)
Indication for Consultation:  Management of ESRD/hemodialysis; anemia, hypertension/volume and secondary hyperparathyroidism PCP:  HPI: Madeline [name] is a 61 Y/O AA female with ESRD on hemodialysis T,Th,S at [facility]. PMH significant for DM, HTN, cardiorenal syndrome, CVA, pulmonary HTN, HFpEF (EF 60-65% 04/07/16), CAD, Hep C, pancreatitis. She has had multiple admissions for multiple for HONK-last admission 10/29/16. Last hemodialysis was 12/19/16 and she did stay for full treatment. H/O of occasionally missing or shortening treatments.   Patient states she missed HD 12/22/16 D/T diarrhea and headache. She says she was getting dressed for scheduled hemodialysis and "that is the last I remember! My family said I had a seizure but I don't remember anything!". Per ER notes, family reported witnessed seizure activity lasting 3-5 minutes and called EMS. EMS reported patient being post ictal on arrival to scene, BS 500s. She was transported to [facility] for evaluation. CT of head negative for acute infarct, intracranial mass or hemorrhage. BS 588 Na 130 Co2 23 normal AG. Scr 6.6 BUN 43K+ 4.7 HGB 12.5 WBC 6.0. CXR with mild interstitial edema. She was afebrile on adm, BP 185/89 HR 67 RR 14. Currently, she is awake, alert, oriented X 3 and at baseline mentally. She still C/O headache but no diarrhea today. Says she did take insulin this AM.   Not overtly volume overloaded but does has mild interstitial edema on CXR. Will enter HD orders for later this afternoon. She has not gotten to EDW 55.5 kg in last 4 treatments, leaving 0.7-2.1 above EDW. Raise 0.5 kg. Will consult formally if patient is admitted as in-patient.        Past Medical History:  Diagnosis Date  . Abnormal Doppler ultrasound of carotid artery    a. Per [place] records: <50% LICA.  Marland Kitchen Anasarca    a. Per [place] records - due to pulm HTN with R HF.   Marland Kitchen Aseptic meningitis    a. 09/2012: adm in [place] for metabolic  encephalopathy, oliguric tubular necrosis, anemia, HTN, possible CVA, HHNKA.  Marland Kitchen CHF (congestive heart failure) (HCC)    a. HFpEF with RHF/anasarca/pHTN.  . CKD (chronic kidney disease), stage III    a. Per [place] records: h/o ARF after CTA that ruled out PE.  Marland Kitchen COPD (chronic obstructive pulmonary disease) (HCC)   . Coronary artery disease    a. Per [place] records: NSTEMI 01/2012, tx medically given ARF but suspected CAD. b. Stress test 12/16/11 reported w/o ischemia.  Marland Kitchen CVA (cerebral infarction)    a. Per [place] records, "possible CVA" 09/2012 but MRI reportedly negative.  . Diabetes mellitus (HCC)   . Dialysis patient [facility])    Mon, Wed, Fridays  . Hypertension   . OSA (obstructive sleep apnea)    a. Pt reported used to use CPAP in [place], but "ran out" when came to [facility].  Marland Kitchen Pulmonary hypertension (HCC)    a. RHC 02/28/13: mod pulm HTN with normal PVR suggestive of predominantly pulmonary venous HTN.  Marland Kitchen Renal insufficiency    dialysis 3 days/week  . Right heart failure (HCC)   . Stroke [facility]) 2014        Past Surgical History:  Procedure Laterality Date  . ABDOMINAL HYSTERECTOMY    . AV FISTULA PLACEMENT Left 03/03/2013   Procedure: ARTERIOVENOUS (AV) FISTULA CREATION, Brachial/Cephalic;  Surgeon: Larina Earthly, MD;  Location: [facility] OR;  Service: Vascular;  Laterality: Left;  . ESOPHAGOGASTRODUODENOSCOPY N/A 02/16/2013   Procedure: ESOPHAGOGASTRODUODENOSCOPY (EGD);  Surgeon: Graylin Shiver, MD;  [facility] ENDOSCOPY;  Service: Endoscopy;  Laterality: N/A;  . INSERTION OF DIALYSIS CATHETER Right 03/03/2013   Procedure: INSERTION OF DIALYSIS CATHETER;  Surgeon: Larina Earthlyodd F Early, MD;  [facility];  Service: Vascular;  Laterality: Right;  Right Internal Jugular Placement  . RIGHT HEART CATHETERIZATION N/A 02/28/2013   Procedure: RIGHT HEART CATH;  Surgeon: Dolores Pattyaniel R Bensimhon, MD;  [facility];  Service: Cardiovascular;  Laterality: N/A;  . SHUNTOGRAM N/A 07/13/2013    Procedure: FISTULOGRAM;  Surgeon: Fransisco HertzBrian L Chen, MD;  [facility];  Service: Cardiovascular;  Laterality: N/A;  . TUBAL LIGATION          Family History  Problem Relation Age of Onset  . Diabetes Mellitus II Sister   . Diabetes Mellitus II Brother   . CAD Brother    Social History:  reports that she has quit smoking. Her smokeless tobacco use includes Snuff. She reports that she does not drink alcohol or use drugs. No Known Allergies        Prior to Admission medications   Medication Sig Start Date End Date Taking? Authorizing Provider  acetaminophen (TYLENOL) 500 MG tablet Take 500-1,000 mg by mouth every 6 (six) hours as needed (for pain).    Yes [provider]  albuterol (PROVENTIL HFA;VENTOLIN HFA) 108 (90 BASE) MCG/ACT inhaler Inhale 2 puffs into the lungs every 6 (six) hours as needed for wheezing or shortness of breath.    Yes [provider]  amLODipine (NORVASC) 10 MG tablet Take 10 mg by mouth daily.    Yes [provider]  aspirin EC 81 MG tablet Take 81 mg by mouth daily.   Yes [provider]  carvedilol (COREG) 25 MG tablet Take 25 mg by mouth 2 (two) times daily with a meal.   Yes [provider]  cinacalcet (SENSIPAR) 30 MG tablet Take 2 tablets (60 mg total) by mouth daily with supper. 11/03/16  Yes Dorothea OgleMyers, Iskra M, MD  insulin lispro (HUMALOG) 100 UNIT/ML injection Inject 3-10 Units into the skin 3 (three) times daily before meals. Per sliding scale: "BGL 200 = 3 units; 250 = 5 units; 300 = 7 units; 350 = 9-10 units"   Yes [provider]  metoCLOPramide (REGLAN) 5 MG tablet Take 1 tablet (5 mg total) by mouth 4 (four) times daily -  before meals and at bedtime. 10/26/14  Yes Vann, Jessica U, DO  mirtazapine (REMERON) 15 MG tablet Take 15 mg by mouth at bedtime.   Yes [provider]  multivitamin (RENA-VIT) TABS tablet Take 1 tablet by mouth at bedtime. Patient taking differently:  Take 1 tablet by mouth daily.  11/03/16  Yes Dorothea OgleMyers, Iskra M, MD  nitroGLYCERIN (NITROSTAT) 0.4 MG SL tablet PLACE ONE TABLET UNDER THE TONGUE EVERY FIVE MINUTES AS NEEDED FOR CHEST PAIN 04/14/16  Yes Bensimhon, Bevelyn Bucklesaniel R, MD  pantoprazole (PROTONIX) 40 MG tablet Take 1 tablet (40 mg total) by mouth 2 (two) times daily. 06/05/13  Yes Chikowski, Vinnie Langtonachel E, MD  polyethylene glycol (MIRALAX / GLYCOLAX) packet Take 17 g by mouth daily. Patient taking differently: Take 17 g by mouth daily as needed for mild constipation.  10/16/14  Yes Zannie CoveJoseph, Preetha, MD  promethazine (PHENERGAN) 25 MG tablet Take 25 mg by mouth every 4 (four) hours as needed for nausea or vomiting.    Yes [provider]  ranitidine (ZANTAC) 150 MG tablet Take 150 mg by mouth daily as needed for heartburn.   Yes  [provider]  rosuvastatin (CRESTOR) 10 MG tablet Take 1 tablet (10 mg total) by mouth daily. 03/16/16  Yes Comer, Belia Heman, MD  traMADol (ULTRAM) 50 MG tablet Take 50 mg by mouth 2 (two) times daily as needed (for pain).    Yes [provider]  insulin glargine (LANTUS) 100 UNIT/ML injection Inject 0.17 mLs (17 Units total) into the skin at bedtime. Patient not taking: Reported on 12/24/2016 02/12/16   Leatha Gilding, MD            Current Facility-Administered Medications  Medication Dose Route Frequency Provider Last Rate Last Dose  . 0.9 %  sodium chloride infusion   Intravenous Continuous Vanetta Mulders, MD      . acetaminophen (TYLENOL) tablet 650 mg  650 mg Oral Q6H PRN Haydee Salter, MD       Or  . acetaminophen (TYLENOL) suppository 650 mg  650 mg Rectal Q6H PRN Haydee Salter, MD      . albuterol (PROVENTIL HFA;VENTOLIN HFA) 108 (90 Base) MCG/ACT inhaler 2 puff  2 puff Inhalation Q6H PRN Haydee Salter, MD      . amLODipine (NORVASC) tablet 10 mg  10 mg Oral Daily Haydee Salter, MD      . Melene Muller ON 12/25/2016] aspirin EC tablet 81 mg  81 mg Oral Daily Haydee Salter, MD      . carvedilol (COREG) tablet 25 mg  25 mg Oral BID WC Haydee Salter, MD      . cinacalcet [facility]) tablet 60 mg  60 mg Oral Q supper Haydee Salter, MD      . dextrose 5 %-0.45 % sodium chloride infusion   Intravenous Continuous Vanetta Mulders, MD      . dextrose 50 % solution 25 mL  25 mL Intravenous PRN Vanetta Mulders, MD      . Melene Muller ON 12/26/2016] doxercalciferol (HECTOROL) injection 7 mcg  7 mcg Intravenous Q T,Th,Sa-HD Pola Corn, NP      . famotidine (PEPCID) tablet 20 mg  20 mg Oral Daily Haydee Salter, MD      . heparin injection 5,000 Units  5,000 Units Subcutaneous Q8H Haydee Salter, MD      . hydrALAZINE (APRESOLINE) injection 10 mg  10 mg Intravenous Q8H PRN Haydee Salter, MD      . insulin regular (NOVOLIN R,HUMULIN R) 100 Units in sodium chloride 0.9 % 100 mL (1 Units/mL) infusion   Intravenous Continuous Vanetta Mulders, MD      . insulin regular bolus via infusion 0-10 Units  0-10 Units Intravenous TID WC Vanetta Mulders, MD      . LORazepam (ATIVAN) injection 1-2 mg  1-2 mg Intravenous Q6H PRN Haydee Salter, MD      . mirtazapine (REMERON) tablet 15 mg  15 mg Oral QHS Haydee Salter, MD      . nitroGLYCERIN (NITROSTAT) SL tablet 0.4 mg  0.4 mg Sublingual Q5 min PRN Haydee Salter, MD      . pantoprazole (PROTONIX) EC tablet 40 mg  40 mg Oral BID Haydee Salter, MD      . promethazine (PHENERGAN) tablet 12.5 mg  12.5 mg Oral Q6H PRN Haydee Salter, MD      . rosuvastatin (CRESTOR) tablet 10 mg  10 mg Oral Daily Haydee Salter, MD             Current Outpatient Prescriptions  Medication Sig Dispense  Refill  . acetaminophen (TYLENOL) 500 MG tablet Take 500-1,000 mg by mouth every 6 (six) hours as needed (for pain).     Marland Kitchen albuterol (PROVENTIL HFA;VENTOLIN HFA) 108 (90 BASE) MCG/ACT inhaler Inhale 2 puffs into the lungs every 6 (six) hours as needed for wheezing or shortness of breath.     Marland Kitchen amLODipine (NORVASC) 10 MG  tablet Take 10 mg by mouth daily.     Marland Kitchen aspirin EC 81 MG tablet Take 81 mg by mouth daily.    . carvedilol (COREG) 25 MG tablet Take 25 mg by mouth 2 (two) times daily with a meal.    . cinacalcet (SENSIPAR) 30 MG tablet Take 2 tablets (60 mg total) by mouth daily with supper. 60 tablet 0  . insulin lispro (HUMALOG) 100 UNIT/ML injection Inject 3-10 Units into the skin 3 (three) times daily before meals. Per sliding scale: "BGL 200 = 3 units; 250 = 5 units; 300 = 7 units; 350 = 9-10 units"    . metoCLOPramide (REGLAN) 5 MG tablet Take 1 tablet (5 mg total) by mouth 4 (four) times daily -  before meals and at bedtime. 90 tablet 0  . mirtazapine (REMERON) 15 MG tablet Take 15 mg by mouth at bedtime.    . multivitamin (RENA-VIT) TABS tablet Take 1 tablet by mouth at bedtime. (Patient taking differently: Take 1 tablet by mouth daily. ) 30 tablet 0  . nitroGLYCERIN (NITROSTAT) 0.4 MG SL tablet PLACE ONE TABLET UNDER THE TONGUE EVERY FIVE MINUTES AS NEEDED FOR CHEST PAIN 30 tablet 3  . pantoprazole (PROTONIX) 40 MG tablet Take 1 tablet (40 mg total) by mouth 2 (two) times daily. 60 tablet 1  . polyethylene glycol (MIRALAX / GLYCOLAX) packet Take 17 g by mouth daily. (Patient taking differently: Take 17 g by mouth daily as needed for mild constipation. ) 14 each 0  . promethazine (PHENERGAN) 25 MG tablet Take 25 mg by mouth every 4 (four) hours as needed for nausea or vomiting.     . ranitidine (ZANTAC) 150 MG tablet Take 150 mg by mouth daily as needed for heartburn.    . rosuvastatin (CRESTOR) 10 MG tablet Take 1 tablet (10 mg total) by mouth daily. 30 tablet 11  . traMADol (ULTRAM) 50 MG tablet Take 50 mg by mouth 2 (two) times daily as needed (for pain).     . insulin glargine (LANTUS) 100 UNIT/ML injection Inject 0.17 mLs (17 Units total) into the skin at bedtime. (Patient not taking: Reported on 12/24/2016) 10 mL 11   Labs: Basic Metabolic Panel:  Last Labs    Recent Labs Lab  12/24/16 1130  NA 130*  K 4.7  CL 95*  CO2 23  GLUCOSE 588*  BUN 43*  CREATININE 6.63*  CALCIUM 7.8*      CBC:  Last Labs    Recent Labs Lab 12/24/16 1130  WBC 6.0  NEUTROABS 4.4  HGB 12.5  HCT 39.8  MCV 89.8  PLT 132*      Imaging Results (Last 48 hours)  Dg Chest 2 View  Result Date: 12/24/2016 CLINICAL DATA:  Dialysis.  Cough. EXAM: CHEST  2 VIEW COMPARISON:  10/30/2016. FINDINGS: Cardiomegaly with mild pulmonary vascular prominence and bilateral interstitial prominence consistent with mild CHF. No pleural effusion or pneumothorax. IMPRESSION: Findings consistent with congestive heart failure with mild interstitial edema. Electronically Signed   By: Maisie Fus  Register   On: 12/24/2016 12:35   Ct Head Wo Contrast  Result Date: 12/24/2016  CLINICAL DATA:  Seizure with headache EXAM: CT HEAD WITHOUT CONTRAST TECHNIQUE: Contiguous axial images were obtained from the base of the skull through the vertex without intravenous contrast. COMPARISON:  October 29, 2016 FINDINGS: Brain: The ventricles are normal in size and configuration. There is no intracranial mass, hemorrhage, extra-axial fluid collection, or midline shift. There is mild patchy small vessel disease in the centra semiovale bilaterally. Elsewhere gray-white compartments appear normal. There is no evident acute infarct. Vascular: No hyperdense vessel. There is calcification in each carotid siphon region. There is also calcification in each distal vertebral artery. Skull: Bony calvarium appears intact. Sinuses/Orbits: There is mucosal thickening in several ethmoid air cells bilaterally. Other visualized paranasal sinuses are clear. Visualized orbits appear symmetric bilaterally except for questionable cataract removal on the right. Other: Mastoid air cells are clear. IMPRESSION: Patchy periventricular small vessel disease, essentially stable. No intracranial mass, hemorrhage, or extra-axial fluid collection. No evident  acute infarct. Multiple foci of arterial vascular calcification noted. Mucosal thickening in several ethmoid air cells bilaterally. Electronically Signed   By: Bretta Bang III M.D.   On: 12/24/2016 12:10     ROS: As per HPI otherwise negative.   Physical Exam:       Vitals:   12/24/16 1100 12/24/16 1130 12/24/16 1200 12/24/16 1330  BP: (!) 152/129 (!) 176/102 (!) 183/91 (!) 196/83  Pulse: 73 71 70 74  Resp: 17 (!) 23 15 19   Temp:      TempSrc:      SpO2: 100% 100% 99% 100%  Weight:         General: Well nourished, well developed in NAD Head: Normocephalic, atraumatic, sclera non-icteric, mucus membranes are moist Neck: Supple. JVD mildly elevated at 30 degrees.  Lungs: Clear bilaterally to auscultation without wheezes, rales, or rhonchi. Breathing is unlabored. Slightly decreased in bases.  Heart: RRR with S1 S2 2/6 systolic M 3rd ICS LSB. No R/G Abdomen: Soft, non-tender, non-distended with normoactive bowel sounds. No rebound/guarding. No obvious abdominal masses. M-S:  Strength and tone appear normal for age. Lower extremities:without edema or ischemic changes, no open wounds  Neuro: Alert and oriented X 3. Moves all extremities spontaneously. Psych:  Responds to questions appropriately with a normal affect. Dialysis Access: LUA AVF + bruit  Hemodialysis Orders: [facility] T,Th, S 4 hrs 180 NRe 55.5 kg 3.0 K/2.25 Ca 400/Auto 1.5 UFP-4 -Heparin 4000 units IV TIW -Hectorol 7 mcg IV TIW -Mircera 225 mcg IV q 2 weeks (last dose 12/17/16 last HGB 11.4 12/17/16) -Venofer 100 mg IV X 10 doses (has rec'd 4/10 doses last dose 12/19/16 Last Fe 33 Tsat 19% Ferritin 334 12/03/16) LUA AVF + bruit.   BMD meds:  -Sensipar 60 mg PO daily with largest meal -Renvela 800 mg 3 tabs PO TID A/C (last phos 5.8 Ca 8.4 12/17/16)  Assessment/Plan: 1.  Witnessed seizure activity: No further seizure activity since arrival to ED. CT negative. Per primary. Neurology consulted.   2.  Hyperglycemia: BS 500s normal AG Na 130. Started on insulin gtt. 3.  ESRD -  T,Th, S missed HD 08/14. Will have HD today. K+ 4.7. Usually dialyzes on 3.0 K bath. Usual heparin dose.  4.  Hypertension/volume  - BP elevated in ED. Mild interstitial edema on CXR. Has not been getting to OP EDW, however has been leaving ABOVE EDW. Amlodipine 10 mg PO Q hs, Coreg 25 mg PO BID on OP med list. Will continue. Get standing wt in HD attempt to get to EDW  56 kg.  5.  Anemia  - HGB 12.5 No ESA needed at present. Continue Fe load.  6.  Metabolic bone disease - Continue binders, sensipar, VDRA. Ca 7.5 C Ca 8.2 Phos 6.3 7.  Nutrition - albumin 3.1.NPO at present. Renal/Carb mod diet when able to eat. Renal vit/Nepro.  8. DM: as noted above 9. H/O HF: continue coreg, monitor volume.   Jaccob Czaplicki H. Manson Passey, NP-C 12/24/2016, 1:43 PM  [employer] [phone]

## 2016-12-24 NOTE — ED Provider Notes (Signed)
[facility]-EM[facility]ENCY DEPT Provider Note   CSN: [id number] Arrival date & time: 12/24/16  1024     History   Chief Complaint Chief Complaint  Patient presents with  . Seizures    HPI Madeline [name] is a 61 y.o. female.   On EMS arrival patient was postictal. Blood sugar reading was 550. Upon arrival to the emergency department patient was alert oriented complained of pain in her head and stated that he been ongoing on for 3 days. Patient is a dialysis patient long-term normally dialyzed Tuesday Thursdays and Saturdays. But missed dialysis on Tuesday. Patient's blood pressure was also significantly elevated. Patient other than the head pain had no specific complaints. Patient had an admission in July for similar complaints.      Past Medical History:  Diagnosis Date  . Abnormal Doppler ultrasound of carotid artery    a. Per [place] records: <50% LICA.  Marland Kitchen Anasarca    a. Per [place] records - due to pulm HTN with R HF.   Marland Kitchen Aseptic meningitis    a. 09/2012: adm in [place] for metabolic encephalopathy, oliguric tubular necrosis, anemia, HTN, possible CVA, HHNKA.  Marland Kitchen CHF (congestive heart failure) (HCC)    a. HFpEF with RHF/anasarca/pHTN.  . CKD (chronic kidney disease), stage III    a. Per [place] records: h/o ARF after CTA that ruled out PE.  Marland Kitchen COPD (chronic obstructive pulmonary disease) (HCC)   . Coronary artery disease    a. Per [place] records: NSTEMI 01/2012, tx medically given ARF but suspected CAD. b. Stress test 12/16/11 reported w/o ischemia.  Marland Kitchen CVA (cerebral infarction)    a. Per [place] records, "possible CVA" 09/2012 but MRI reportedly negative.  . Diabetes mellitus (HCC)   . Dialysis patient [facility])    Mon, Wed, Fridays  . Hypertension   . OSA (obstructive sleep apnea)    a. Pt reported used to use CPAP in , but "ran out" when came to [facility].  Marland Kitchen Pulmonary hypertension (HCC)    a. RHC 02/28/13: mod pulm HTN with normal PVR suggestive of predominantly pulmonary venous HTN.  Marland Kitchen Renal insufficiency    dialysis 3  days/week  . Right heart failure (HCC)   . Stroke [facility]) 2014    Patient Active Problem List   Diagnosis Date Noted  . Hyperosmolar non-ketotic state in patient with type 2 diabetes mellitus (HCC)   . Hypertensive emergency   . Altered mental status 10/29/2016  . Chronic hepatitis C without hepatic coma (HCC) 03/10/2016  . DKA (diabetic ketoacidosis) (HCC) 02/08/2016  . Uncontrollable vomiting   . Emesis, persistent 02/07/2016  . Abnormal abdominal ultrasound 10/24/2014  . Acute encephalopathy 10/23/2014  . DKA, type 2, not at goal [facility]) 10/23/2014  . Nausea & vomiting 10/23/2014  . Hypertension 10/23/2014  . Abdominal pain   . Encounter for central line placement   . Fever   . Anemia 10/10/2014  . Hypoglycemia 02/07/2014  . Seizure (HCC) 02/07/2014  . ESRD on hemodialysis (HCC) 02/07/2014  . Urinary tract infection, site not specified 02/07/2014  . Pain in the abdomen 01/10/2014  . Anemia of chronic renal failure 01/10/2014  . Secondary hyperparathyroidism (HCC) 01/10/2014  . Hyperglycemia 01/07/2014  . End stage renal disease (HCC) 10/13/2013  . PUD (peptic ulcer disease) 06/03/2013  . Chronic diastolic heart failure (HCC) 03/21/2013  . Acute on chronic diastolic congestive heart failure (HCC) 03/01/2013  . OSA on CPAP 02/16/2013  . HTN (hypertension), malignant 02/14/2013  . Diabetes mellitus type II, uncontrolled (  HCC) 02/14/2013  . DM (diabetes mellitus) (HCC) 04/05/2012    Past Surgical History:  Procedure Laterality Date  . ABDOMINAL HYSTERECTOMY    . AV FISTULA PLACEMENT Left 03/03/2013   Procedure: ARTERIOVENOUS (AV) FISTULA CREATION, Brachial/Cephalic;  Surgeon: Larina Earthlyodd F Early, MD;  [facility];  Service: Vascular;  Laterality: Left;  . ESOPHAGOGASTRODUODENOSCOPY N/A 02/16/2013   Procedure: ESOPHAGOGASTRODUODENOSCOPY (EGD);  Surgeon: Graylin ShiverSalem F Ganem, MD;  [facility];  Service: Endoscopy;  Laterality: N/A;  . INSERTION OF DIALYSIS CATHETER Right  03/03/2013   Procedure: INSERTION OF DIALYSIS CATHETER;  Surgeon: Larina Earthlyodd F Early, MD;  [facility];  Service: Vascular;  Laterality: Right;  Right Internal Jugular Placement  . RIGHT HEART CATHETERIZATION N/A 02/28/2013   Procedure: RIGHT HEART CATH;  Surgeon: Dolores Pattyaniel R Bensimhon, MD;  [facility];  Service: Cardiovascular;  Laterality: N/A;  . SHUNTOGRAM N/A 07/13/2013   Procedure: FISTULOGRAM;  Surgeon: Fransisco HertzBrian L Chen, MD;  [facility];  Service: Cardiovascular;  Laterality: N/A;  . TUBAL LIGATION      OB History    No data available       Home Medications    Prior to Admission medications   Medication Sig Start Date End Date Taking? Authorizing Provider  acetaminophen (TYLENOL) 500 MG tablet Take 500-1,000 mg by mouth every 6 (six) hours as needed (for pain).     [provider]  albuterol (PROVENTIL HFA;VENTOLIN HFA) 108 (90 BASE) MCG/ACT inhaler Inhale 2 puffs into the lungs every 6 (six) hours as needed for wheezing or shortness of breath.     [provider]  amLODipine (NORVASC) 10 MG tablet Take 10 mg by mouth daily.     [provider]  aspirin EC 81 MG tablet Take 81 mg by mouth daily.    [provider]  carvedilol (COREG) 25 MG tablet Take 25 mg by mouth 2 (two) times daily with a meal.    [provider]  cinacalcet (SENSIPAR) 30 MG tablet Take 2 tablets (60 mg total) by mouth daily with supper. 11/03/16   Dorothea OgleMyers, Iskra M, MD  dronabinol (MARINOL) 2.5 MG capsule Take 2.5 mg by mouth 2 (two) times daily before a meal.    [provider]  insulin glargine (LANTUS) 100 UNIT/ML injection Inject 0.17 mLs (17 Units total) into the skin at bedtime. 02/12/16   Leatha GildingGherghe, Costin M, MD  insulin lispro (HUMALOG) 100 UNIT/ML injection Inject 3-10 Units into the skin 3 (three) times daily before meals. Per sliding scale: "BGL 200 = 3 units; 250 = 5 units; 300 = 7 units; 350 = 9-10 units"    [provider]    metoCLOPramide (REGLAN) 5 MG tablet Take 1 tablet (5 mg total) by mouth 4 (four) times daily -  before meals and at bedtime. 10/26/14   Joseph ArtVann, Jessica U, DO  mirtazapine (REMERON) 15 MG tablet Take 15 mg by mouth at bedtime.    [provider]  multivitamin (RENA-VIT) TABS tablet Take 1 tablet by mouth at bedtime. 11/03/16   Dorothea OgleMyers, Iskra M, MD  nitroGLYCERIN (NITROSTAT) 0.4 MG SL tablet PLACE ONE TABLET UNDER THE TONGUE EVERY FIVE MINUTES AS NEEDED FOR CHEST PAIN 04/14/16   Bensimhon, Bevelyn Bucklesaniel R, MD  pantoprazole (PROTONIX) 40 MG tablet Take 1 tablet (40 mg total) by mouth 2 (two) times daily. 06/05/13   Coolidge Breezehikowski, Rachel E, MD  polyethylene glycol (MIRALAX / GLYCOLAX) packet Take 17 g by mouth daily. Patient taking differently: Take  17 g by mouth daily as needed for mild constipation.  10/16/14   Zannie Cove, MD  promethazine (PHENERGAN) 25 MG tablet Take 25 mg by mouth 2 (two) times daily as needed for nausea or vomiting.     [provider]  ranitidine (ZANTAC) 150 MG tablet Take 150 mg by mouth daily as needed for heartburn.    [provider]  rosuvastatin (CRESTOR) 10 MG tablet Take 1 tablet (10 mg total) by mouth daily. 03/16/16   Comer, Belia Heman, MD  traMADol (ULTRAM) 50 MG tablet Take 50 mg by mouth 2 (two) times daily as needed (for pain).     [provider]    Family History Family History  Problem Relation Age of Onset  . Diabetes Mellitus II Sister   . Diabetes Mellitus II Brother   . CAD Brother     Social History Social History  Substance Use Topics  . Smoking status: Former Games developer  . Smokeless tobacco: Current User    Types: Snuff  . Alcohol use No     Allergies   Patient has no known allergies.   Review of Systems Review of Systems  Constitutional: Negative for fever.  HENT: Negative for congestion.   Eyes: Negative for redness.  Respiratory: Negative for shortness of breath.   Cardiovascular: Negative for chest pain.   Gastrointestinal: Negative for abdominal pain.  Genitourinary: Negative for dysuria.  Musculoskeletal: Negative for back pain and neck pain.  Skin: Negative for rash.  Allergic/Immunologic: Positive for immunocompromised state.  Neurological: Positive for seizures and headaches.  Hematological: Bruises/bleeds easily.  Psychiatric/Behavioral: Negative for confusion.     Physical Exam Updated Vital Signs BP (!) 183/91   Pulse 70   Temp (!) 97.4 F (36.3 C) (Oral)   Resp 15   Wt 55.3 kg (122 lb)   SpO2 99%   BMI 26.40 kg/m   Physical Exam  Constitutional: She is oriented to person, place, and time. She appears well-developed and well-nourished. No distress.  HENT:  Head: Normocephalic and atraumatic.  Mouth/Throat: Oropharynx is clear and moist.  Eyes: Pupils are equal, round, and reactive to light. EOM are normal.  Neck: Normal range of motion. Neck supple.  Cardiovascular: Normal rate, regular rhythm and normal heart sounds.   Pulmonary/Chest: Effort normal and breath sounds normal.  Abdominal: Soft. Bowel sounds are normal. There is no tenderness.  Musculoskeletal: Normal range of motion.  AV fistula with good thrill left upper extremity.  Neurological: She is alert and oriented to person, place, and time. No cranial nerve deficit or sensory deficit. She exhibits normal muscle tone. Coordination normal.  Skin: Skin is warm.  Nursing note and vitals reviewed.    ED Treatments / Results  Labs (all labs ordered are listed, but only abnormal results are displayed) Labs Reviewed  CBC WITH DIFFERENTIAL/PLATELET - Abnormal; Notable for the following:       Result Value   Platelets 132 (*)    All other components within normal limits  BASIC METABOLIC PANEL - Abnormal; Notable for the following:    Sodium 130 (*)    Chloride 95 (*)    Glucose, Bld 588 (*)    BUN 43 (*)    Creatinine, Ser 6.63 (*)    Calcium 7.8 (*)    GFR calc non Af Amer 6 (*)    GFR calc Af Amer 7  (*)    All other components within normal limits  I-STAT BETA HCG BLOOD, ED ([facility], [facility],  [facility] ONLY)    EKG  EKG Interpretation None       Radiology Dg Chest 2 View  Result Date: 12/24/2016 CLINICAL DATA:  Dialysis.  Cough. EXAM: CHEST  2 VIEW COMPARISON:  10/30/2016. FINDINGS: Cardiomegaly with mild pulmonary vascular prominence and bilateral interstitial prominence consistent with mild CHF. No pleural effusion or pneumothorax. IMPRESSION: Findings consistent with congestive heart failure with mild interstitial edema. Electronically Signed   By: Maisie Fus  Register   On: 12/24/2016 12:35   Ct Head Wo Contrast  Result Date: 12/24/2016 CLINICAL DATA:  Seizure with headache EXAM: CT HEAD WITHOUT CONTRAST TECHNIQUE: Contiguous axial images were obtained from the base of the skull through the vertex without intravenous contrast. COMPARISON:  October 29, 2016 FINDINGS: Brain: The ventricles are normal in size and configuration. There is no intracranial mass, hemorrhage, extra-axial fluid collection, or midline shift. There is mild patchy small vessel disease in the centra semiovale bilaterally. Elsewhere gray-white compartments appear normal. There is no evident acute infarct. Vascular: No hyperdense vessel. There is calcification in each carotid siphon region. There is also calcification in each distal vertebral artery. Skull: Bony calvarium appears intact. Sinuses/Orbits: There is mucosal thickening in several ethmoid air cells bilaterally. Other visualized paranasal sinuses are clear. Visualized orbits appear symmetric bilaterally except for questionable cataract removal on the right. Other: Mastoid air cells are clear. IMPRESSION: Patchy periventricular small vessel disease, essentially stable. No intracranial mass, hemorrhage, or extra-axial fluid collection. No evident acute infarct. Multiple foci of arterial vascular calcification noted. Mucosal thickening in several ethmoid air cells bilaterally.  Electronically Signed   By: Bretta Bang III M.D.   On: 12/24/2016 12:10    Procedures Procedures (including critical care time)  CRITICAL CARE Performed by: Vanetta Mulders Total critical care time: 30 minutes Critical care time was exclusive of separately billable procedures and treating other patients. Critical care was necessary to treat or prevent imminent or life-threatening deterioration. Critical care was time spent personally by me on the following activities: development of treatment plan with patient and/or surrogate as well as nursing, discussions with consultants, evaluation of patient's response to treatment, examination of patient, obtaining history from patient or surrogate, ordering and performing treatments and interventions, ordering and review of laboratory studies, ordering and review of radiographic studies, pulse oximetry and re-evaluation of patient's condition.   CRITICAL CARE Performed by: Vanetta Mulders Total critical care time: 30 minutes Critical care time was exclusive of separately billable procedures and treating other patients. Critical care was necessary to treat or prevent imminent or life-threatening deterioration. Critical care was time spent personally by me on the following activities: development of treatment plan with patient and/or surrogate as well as nursing, discussions with consultants, evaluation of patient's response to treatment, examination of patient, obtaining history from patient or surrogate, ordering and performing treatments and interventions, ordering and review of laboratory studies, ordering and review of radiographic studies, pulse oximetry and re-evaluation of patient's condition.  Medications Ordered in ED Medications  acetaminophen (TYLENOL) tablet 650 mg (650 mg Oral Given 12/24/16 1209)     Initial Impression / Assessment and Plan / ED Course  I have reviewed the triage vital signs and the nursing notes.  Pertinent  labs & imaging results that were available during my care of the patient were reviewed by me and considered in my medical decision making (see chart for details).    Patient brought in by EMS. Patient with tonic-clonic seizure witnessed by family lasting 3-5 minutes. Patient is a dialysis patient.  Patient normally dialyzed Tuesday Thursdays and Saturdays. Patient with due to be dialyzed today. Patient missed dialysis on Tuesday. Patient with similar admission  In July and was discharged July 21. At that time she had altered mental status. Ended up in the ICU. Then she transferred to the triad hospitalist service. Patient blood sugar was high upon arrival that time. Today it's above 550. Patient is alert and will follow commands upon arrival. Patient complaining of head pain for the past 3 days.    Potassium was not elevated. Blood sugar elevated at 588. Patient started on glucose stabilizer drip.the patient refused to allow [place] to start it. Also patient already was arranged for admission. Originally she was in agreement with the plan but then had a change of mind and wanted to go home. Hospitalist agreed to admit her and had placed the admitting orders.   Also patient had already been evaluated by the nephrology nerve practitioner. They were planning to dialyze her around 5 this evening. Plan was for patient to be admitted started on a glucose stabilizer bring down her blood sugars. And to dialyze her.    Things got complicated when the patient refused the insulin drip and and change her mind about the admission. Patient needed family to come pick her up to take her home. We Her IV and even know she wanted it out. Had discussed with patient that she required admission and that the fact that she had missed dialysis on Tuesday and that her blood sugars were very high that it could be very unsafe to go home she probably could be dialyzed again until Saturday. And that it was not clear that she may not become  very sick or may even have demise prior to that time. Patient seemed to understand but still wanted to go home. We wanted to discuss with her family member on the phone but patient didn't want [place] to talk to family member.  When family member arrived nurse and I discussed with the family member what we have told. Family member was very upset that I told her that if she refused admission it was possible that she could run into a serious event including death. Fortunately patient's family member was in agreement with getting patient admitted and was able to talk her mother into the admission. Patient went and had dialysis. Patient did eventually get started on the glucose stabilizer.  Head CT was negative. Not clear what brings on the seizures. In past felt it may have been due to the very high blood sugars. Chest x-ray as mentioned did show some evidence of some mild volume overload in the lungs.   Final Clinical Impressions(s) / ED Diagnoses   Final diagnoses:  Seizure (HCC)  Hyperglycemia  End stage renal disease on dialysis [facility])  Essential hypertension    New Prescriptions New Prescriptions   No medications on file     Vanetta Mulders, MD 12/24/16 1729

## 2016-12-24 NOTE — Progress Notes (Signed)
Dialysis treatment terminated early per patient with 54 minutes remaining.  Nephrology notified and AMA form signed.  3376 mL ultrafiltrated and net fluid removal 2876 mL.    Patient status unchanged. Lung sounds diminished to ausculation in all fields. No edema. Cardiac: NSR.  Disconnected lines and removed needles.  Pressure held for LUAVF minutes and band aid/gauze dressing applied.  Report given to bedside RN, 15.    Post treatment, patient adamant about leaving hospital AMA.  This RN explained that HD is unable to accommodate the request as HD is a procedural unit and unable to discharge patients.  This RN explained to patient that once she has transferred to [facility], the bedside RN would be able to discharge her AMA.  This policy also explained to charge RN of [facility] per HD Associate Professorunit manager.

## 2016-12-24 NOTE — Progress Notes (Signed)
Patient arrived on floor from dialysis stating she wants to leave the hospital AMA, spoke with the technician and manager in dialysis and they were addiment about "they could not let patient leave AMA from their procedural area. Jodi GeraldsJami S. paged the ED doctor that was listed as her attending and got no response the patient also stated and Jami received in report that the patient requested to leave AMA from the ED and was not allowed. The patient was signed out AMA by Clearnce HastenJami S.and her IV was removed she was tacken by wheel chair to the main entrance were her Daughter picked her up.

## 2016-12-24 NOTE — ED Notes (Signed)
Report called to dialysis.

## 2016-12-24 NOTE — ED Notes (Signed)
Date and time results received: 12/24/16 1210  Test: CBG Critical Value: 588  Name of Provider Notified: Dr. Deretha EmoryZackowski

## 2016-12-24 NOTE — ED Triage Notes (Signed)
Pt presents to the ed with complaints of having a tonic clonic seizure witnessed by family lasting 3-5 minutes. On ems arrival she was post ictal and her blood sugar was reading 550. On arrival to the ed the patient is alert and oriented complaints of pain in her head that has been going on for 3 days.  Pt is a dialysis pt and missed yesterday.

## 2016-12-24 NOTE — H&P (Addendum)
[facility] History and Physical  Madeline [name] AVW:[id number] DOB: 1956-02-11 DOA: 12/24/2016  Referring physician:  PCP: Barbie BannerWilson, [name], MD   Chief Complaint: seizure  HPI: Madeline [name] is a 61 y.o. female  with past mental history significant for heart failure, CK D, diabetes, hemodialysis and seizure-like activity presents emergency room with episode of joint activity. Family witnessed seizure-like activity EMS activated. Elevated blood sugar at the scene. Patient has missed a 2 days of dialysis.  Chart review showed that last episode of seizures with labile elevated blood sugar in the lateral abnormalities. Patient states no prior history of seizures until recently.  No SI/HI  Review of Systems:  As per HPI otherwise 10 point review of systems negative.    Past Medical History:  Diagnosis Date  . Abnormal Doppler ultrasound of carotid artery    a. Per [facility] records: <50% LICA.  Marland Kitchen. Anasarca    a. Per [place] records - due to pulm HTN with R HF.   Marland Kitchen. Aseptic meningitis    a. 09/2012: adm in [place] for metabolic encephalopathy, oliguric tubular necrosis, anemia, HTN, possible CVA, HHNKA.  Marland Kitchen. CHF (congestive heart failure) (HCC)    a. HFpEF with RHF/anasarca/pHTN.  . CKD (chronic kidney disease), stage III    a. Per [facility] records: h/o ARF after CTA that ruled out PE.  Marland Kitchen. COPD (chronic obstructive pulmonary disease) (HCC)   . Coronary artery disease    a. Per [facility] records: NSTEMI 01/2012, tx medically given ARF but suspected CAD. b. Stress test 12/16/11 reported w/o ischemia.  Marland Kitchen. CVA (cerebral infarction)    a. Per [facility] records, "possible CVA" 09/2012 but MRI reportedly negative.  . Diabetes mellitus (HCC)   . Dialysis patient [facility](HCC)    Mon, Wed, Fridays  . Hypertension   . OSA (obstructive sleep apnea)    a. Pt reported used to use CPAP in [place], but "[place]  [place] Kitchen. Pulmonary hypertension (HCC)    a. [facility] 02/28/13: mod pulm HTN with normal PVR suggestive of predominantly pulmonary venous  HTN.  Marland Kitchen. Renal insufficiency    dialysis 3 days/week  . Right heart failure (HCC)   . Stroke [facility](HCC) 2014   Past Surgical History:  Procedure Laterality Date  . ABDOMINAL HYSTERECTOMY    . AV FISTULA PLACEMENT Left 03/03/2013   Procedure: ARTERIOVENOUS (AV) FISTULA CREATION, Brachial/Cephalic;  Surgeon: Larina Earthlyodd F Early, MD;  [facility];  Service: Vascular;  Laterality: Left;  . ESOPHAGOGASTRODUODENOSCOPY N/A 02/16/2013   Procedure: ESOPHAGOGASTRODUODENOSCOPY (EGD);  Surgeon: Graylin ShiverSalem F Ganem, MD;  [facility];  Service: Endoscopy;  Laterality: N/A;  . INSERTION OF DIALYSIS CATHETER Right 03/03/2013   Procedure: INSERTION OF DIALYSIS CATHETER;  Surgeon: Larina Earthlyodd F Early, MD;  [facility];  Service: Vascular;  Laterality: Right;  Right Internal Jugular Placement  . RIGHT HEART CATHETERIZATION N/A 02/28/2013   Procedure: RIGHT HEART CATH;  Surgeon: Dolores Pattyaniel R Bensimhon, MD;  [facility];  Service: Cardiovascular;  Laterality: N/A;  . SHUNTOGRAM N/A 07/13/2013   Procedure: FISTULOGRAM;  Surgeon: Fransisco HertzBrian L Chen, MD;  [facility];  Service: Cardiovascular;  Laterality: N/A;  . TUBAL LIGATION     Social History:  reports that she has quit smoking. Her smokeless tobacco use includes Snuff. She reports that she does not drink alcohol or use drugs.  No Known Allergies  Family History  Problem Relation Age of Onset  . Diabetes Mellitus II Sister   . Diabetes Mellitus  II Brother   . CAD Brother      Prior to Admission medications   Medication Sig Start Date End Date Taking? Authorizing Provider  acetaminophen (TYLENOL) 500 MG tablet Take 500-1,000 mg by mouth every 6 (six) hours as needed (for pain).    Yes [provider]  albuterol (PROVENTIL HFA;VENTOLIN HFA) 108 (90 BASE) MCG/ACT inhaler Inhale 2 puffs into the lungs every 6 (six) hours as needed for wheezing or shortness of breath.    Yes [provider]  amLODipine (NORVASC) 10 MG tablet Take 10 mg  by mouth daily.    Yes [provider]  aspirin EC 81 MG tablet Take 81 mg by mouth daily.   Yes [provider]  carvedilol (COREG) 25 MG tablet Take 25 mg by mouth 2 (two) times daily with a meal.   Yes [provider]  cinacalcet (SENSIPAR) 30 MG tablet Take 2 tablets (60 mg total) by mouth daily with supper. 11/03/16  Yes Dorothea Ogle, MD  insulin lispro (HUMALOG) 100 UNIT/ML injection Inject 3-10 Units into the skin 3 (three) times daily before meals. Per sliding scale: "BGL 200 = 3 units; 250 = 5 units; 300 = 7 units; 350 = 9-10 units"   Yes [provider]  metoCLOPramide (REGLAN) 5 MG tablet Take 1 tablet (5 mg total) by mouth 4 (four) times daily -  before meals and at bedtime. 10/26/14  Yes Vann, Jessica U, DO  mirtazapine (REMERON) 15 MG tablet Take 15 mg by mouth at bedtime.   Yes [provider]  multivitamin (RENA-VIT) TABS tablet Take 1 tablet by mouth at bedtime. Patient taking differently: Take 1 tablet by mouth daily.  11/03/16  Yes Dorothea Ogle, MD  nitroGLYCERIN (NITROSTAT) 0.4 MG SL tablet PLACE ONE TABLET UNDER THE TONGUE EVERY FIVE MINUTES AS NEEDED FOR CHEST PAIN 04/14/16  Yes Bensimhon, Bevelyn Buckles, MD  pantoprazole (PROTONIX) 40 MG tablet Take 1 tablet (40 mg total) by mouth 2 (two) times daily. 06/05/13  Yes Chikowski, Vinnie Langton, MD  polyethylene glycol (MIRALAX / GLYCOLAX) packet Take 17 g by mouth daily. Patient taking differently: Take 17 g by mouth daily as needed for mild constipation.  10/16/14  Yes Zannie Cove, MD  promethazine (PHENERGAN) 25 MG tablet Take 25 mg by mouth every 4 (four) hours as needed for nausea or vomiting.    Yes [provider]  ranitidine (ZANTAC) 150 MG tablet Take 150 mg by mouth daily as needed for heartburn.   Yes [provider]  rosuvastatin (CRESTOR) 10 MG tablet Take 1 tablet (10 mg total) by mouth daily. 03/16/16  Yes Comer, Belia Heman, MD  traMADol (ULTRAM) 50 MG tablet Take 50  mg by mouth 2 (two) times daily as needed (for pain).    Yes [provider]  insulin glargine (LANTUS) 100 UNIT/ML injection Inject 0.17 mLs (17 Units total) into the skin at bedtime. Patient not taking: Reported on 12/24/2016 02/12/16   Leatha Gilding, MD   Physical Exam: Vitals:   12/24/16 1045 12/24/16 1100 12/24/16 1130 12/24/16 1200  BP: (!) 179/74 (!) 152/129 (!) 176/102 (!) 183/91  Pulse: 68 73 71 70  Resp: 12 17 (!) 23 15  Temp:      TempSrc:      SpO2: 98% 100% 100% 99%  Weight:        Wt Readings from Last 3 Encounters:  12/24/16 55.3 kg (122 lb)  11/03/16 55.4 kg (122 lb 2.2  oz)  06/29/16 63 kg (139 lb)    General:  Appears calm and comfortable; A&Ox3 Eyes:  PERRL, EOMI, normal lids, iris ENT:  grossly normal hearing, lips & tongue Neck:  no LAD, masses or thyromegaly Cardiovascular:  RRR, no m/r/g. No LE edema. Fistula LUE Respiratory:  CTA bilaterally, no w/r/r. Normal respiratory effort. Abdomen:  soft, ntnd Skin:  no rash or induration seen on limited exam Musculoskeletal:  grossly normal tone BUE/BLE Psychiatric:  grossly normal mood and affect, speech fluent and appropriate Neurologic:  CN 2-12 grossly intact, moves all extremities in coordinated fashion.          Labs on Admission:  Basic Metabolic Panel:  Recent Labs Lab 12/24/16 1130  NA 130*  K 4.7  CL 95*  CO2 23  GLUCOSE 588*  BUN 43*  CREATININE 6.63*  CALCIUM 7.8*   Liver Function Tests: No results for input(s): AST, ALT, ALKPHOS, BILITOT, PROT, ALBUMIN in the last 168 hours. No results for input(s): LIPASE, AMYLASE in the last 168 hours. No results for input(s): AMMONIA in the last 168 hours. CBC:  Recent Labs Lab 12/24/16 1130  WBC 6.0  NEUTROABS 4.4  HGB 12.5  HCT 39.8  MCV 89.8  PLT 132*   Cardiac Enzymes: No results for input(s): CKTOTAL, CKMB, CKMBINDEX, TROPONINI in the last 168 hours.  BNP (last 3 results) No results for input(s): BNP in the last 8760  hours.  ProBNP (last 3 results) No results for input(s): PROBNP in the last 8760 hours.   Serum creatinine: 6.63 mg/dL (H) 16/10/96 0454 Estimated creatinine clearance: 6.5 mL/min (A)  CBG: No results for input(s): GLUCAP in the last 168 hours.  Radiological Exams on Admission: Dg Chest 2 View  Result Date: 12/24/2016 CLINICAL DATA:  Dialysis.  Cough. EXAM: CHEST  2 VIEW COMPARISON:  10/30/2016. FINDINGS: Cardiomegaly with mild pulmonary vascular prominence and bilateral interstitial prominence consistent with mild CHF. No pleural effusion or pneumothorax. IMPRESSION: Findings consistent with congestive heart failure with mild interstitial edema. Electronically Signed   By: Maisie Fus  Register   On: 12/24/2016 12:35   Ct Head Wo Contrast  Result Date: 12/24/2016 CLINICAL DATA:  Seizure with headache EXAM: CT HEAD WITHOUT CONTRAST TECHNIQUE: Contiguous axial images were obtained from the base of the skull through the vertex without intravenous contrast. COMPARISON:  October 29, 2016 FINDINGS: Brain: The ventricles are normal in size and configuration. There is no intracranial mass, hemorrhage, extra-axial fluid collection, or midline shift. There is mild patchy small vessel disease in the centra semiovale bilaterally. Elsewhere gray-white compartments appear normal. There is no evident acute infarct. Vascular: No hyperdense vessel. There is calcification in each carotid siphon region. There is also calcification in each distal vertebral artery. Skull: Bony calvarium appears intact. Sinuses/Orbits: There is mucosal thickening in several ethmoid air cells bilaterally. Other visualized paranasal sinuses are clear. Visualized orbits appear symmetric bilaterally except for questionable cataract removal on the right. Other: Mastoid air cells are clear. IMPRESSION: Patchy periventricular small vessel disease, essentially stable. No intracranial mass, hemorrhage, or extra-axial fluid collection. No evident acute  infarct. Multiple foci of arterial vascular calcification noted. Mucosal thickening in several ethmoid air cells bilaterally. Electronically Signed   By: Bretta Bang III M.D.   On: 12/24/2016 12:10    EKG: no new  Assessment/Plan Active Problems:   Seizure [facility]  Seizure Check electrolytes Reviewed old record, will get MRI at this time and repeat EEG as well as neurology consult Ativan when necessary Seizure  precautions CT head did not show acute bleed or stroke Neuro consult  Hyperglycemia On insulin drip  HTN Continue Coreg, amlodipine PRN hydralazine  Dialysis EDP consult to nephrology Dialysis later on today Continue Sensipar  Gastroparesis Hold Reglan for now due to electrolyte abnormalities as possibility  Reflux Continue Zantac-->pepcid, Protonix  CAD Continue nitroglycerin when necessary  Elevated cholesterol Continue statin  Mood disorder Continue Remeron  COPD Prn albuterol  CHF Likely due to missed dialysis Patient get treated later on today  Code Status: FC  DVT Prophylaxis: heparin Family Communication: none available Disposition Plan: Pending Improvement  Status: inpt tele  Haydee Salter, MD Family Medicine [facility] [email or web address] Password [email or web address]

## 2016-12-24 NOTE — Progress Notes (Signed)
Patient arrived to floor with transport requesting that she be allowed to leave AMA, that we were holding her hostage.  Charge nurse notified previously, patient was over talking nurse while receiveing report stating she wanted to leave.  Matt Mahoney from HD, stated she must come to floor to be discharge AMA.  Patient's IV was removed at front desk, AMA form signed, and patient wheeled to ED for family to pick up.  Patient was in no distress, and answered all questions appropriately.  Patient was educated about leaving AMA, but pt still wanted to leave now.  Paged [facility] on call dr to notify.

## 2016-12-24 NOTE — Consult Note (Deleted)
 [facility] Renal Consultation Note    Indication for Consultation:  Management of ESRD/hemodialysis; anemia, hypertension/volume and secondary hyperparathyroidism PCP:  HPI: Madeline [name] is a 61 Y/O AA female with ESRD on hemodialysis T,Th,S at [facility] PMH significant for DM, HTN, cardiorenal syndrome, CVA, pulmonary HTN, HFpEF (EF 60-65% 04/07/16), CAD, Hep C, pancreatitis. She has had multiple admissions for multiple for HONK-last admission 10/29/16. Last hemodialysis was 12/19/16 and she did stay for full treatment. H/O of occasionally missing or shortening treatments.   Patient states she missed HD 12/22/16 D/T diarrhea and headache. She says she was getting dressed for scheduled hemodialysis and "that is the last I remember! My family said I had a seizure but I don't remember anything!". Per ER notes, family reported witnessed seizure activity lasting 3-5 minutes and called EMS. EMS reported patient being post ictal on arrival to scene, BS 500s. She was transported to [facility] for evaluation. CT of head negative for acute infarct, intracranial mass or hemorrhage. BS 588 Na 130 Co2 23 normal AG. Scr 6.6 BUN 43 K+ 4.7 HGB 12.5 WBC 6.0. CXR with mild interstitial edema.  She was afebrile on adm, BP 185/89 HR 67 RR 14. Currently, she is awake, alert, oriented X 3 and at baseline mentally. She still C/O headache but no diarrhea today. Says she did take insulin this AM.   Not overtly volume overloaded but does has mild interstitial edema on CXR. Will enter HD orders for later this afternoon. She has not gotten to EDW 55.5 kg in last 4 treatments, leaving 0.7-2.1 above EDW. Raise 0.5 kg.  Will consult formally if patient is admitted as in-patient.    Past Medical History:  Diagnosis Date  . Abnormal Doppler ultrasound of carotid artery    a. Per [place] records: <50% LICA.  Marland Kitchen Anasarca    a. Per [place] records - due to pulm HTN with R HF.   Marland Kitchen Aseptic meningitis    a.  09/2012: adm in [place] for metabolic encephalopathy, oliguric tubular necrosis, anemia, HTN, possible CVA, HHNKA.  Marland Kitchen CHF (congestive heart failure) (HCC)    a. HFpEF with RHF/anasarca/pHTN.  . CKD (chronic kidney disease), stage III    a. Per [place] records: h/o ARF after CTA that ruled out PE.  Marland Kitchen COPD (chronic obstructive pulmonary disease) (HCC)   . Coronary artery disease    a. Per [place] records: NSTEMI 01/2012, tx medically given ARF but suspected CAD. b. Stress test 12/16/11 reported w/o ischemia.  Marland Kitchen CVA (cerebral infarction)    a. Per [place] records, "possible CVA" 09/2012 but MRI reportedly negative.  . Diabetes mellitus (HCC)   . Dialysis patient [facility])    Mon, Wed, Fridays  . Hypertension   . OSA (obstructive sleep apnea)    a. Pt reported used to use CPAP in , but "ran out" when came to [facility].  Marland Kitchen Pulmonary hypertension (HCC)    a. [facility]HC 02/28/13: mod pulm HTN with normal PVR suggestive of predominantly pulmonary venous HTN.  Marland Kitchen Renal insufficiency    dialysis 3 days/week  . Right heart failure (HCC)   . Stroke [facility]) 2014   Past Surgical History:  Procedure Laterality Date  . ABDOMINAL HYSTERECTOMY    . AV FISTULA PLACEMENT Left 03/03/2013   Procedure: ARTERIOVENOUS (AV) FISTULA CREATION, Brachial/Cephalic;  Surgeon: Larina Earthly, MD;  Location: [facility] OR;  Service: Vascular;  Laterality: Left;  . ESOPHAGOGASTRODUODENOSCOPY N/A 02/16/2013   Procedure: ESOPHAGOGASTRODUODENOSCOPY (EGD);  Surgeon: Graylin Shiver,  MD;  Location: [facility] ENDOSCOPY;  Service: Endoscopy;  Laterality: N/A;  . INSERTION OF DIALYSIS CATHETER Right 03/03/2013   Procedure: INSERTION OF DIALYSIS CATHETER;  Surgeon: Larina Earthly, MD;  Location: [facility] OR;  Service: Vascular;  Laterality: Right;  Right Internal Jugular Placement  . RIGHT HEART CATHETERIZATION N/A 02/28/2013   Procedure: RIGHT HEART CATH;  Surgeon: Dolores Patty, MD;  Location: [facility] CATH LAB;  Service: Cardiovascular;  Laterality: N/A;  . SHUNTOGRAM N/A 07/13/2013   Procedure:  FISTULOGRAM;  Surgeon: Fransisco Hertz, MD;  Location: [facility] CATH LAB;  Service: Cardiovascular;  Laterality: N/A;  . TUBAL LIGATION     Family History  Problem Relation Age of Onset  . Diabetes Mellitus II Sister   . Diabetes Mellitus II Brother   . CAD Brother    Social History:  reports that she has quit smoking. Her smokeless tobacco use includes Snuff. She reports that she does not drink alcohol or use drugs. No Known Allergies Prior to Admission medications   Medication Sig Start Date End Date Taking? Authorizing Provider  acetaminophen (TYLENOL) 500 MG tablet Take 500-1,000 mg by mouth every 6 (six) hours as needed (for pain).    Yes [provider]  albuterol (PROVENTIL HFA;VENTOLIN HFA) 108 (90 BASE) MCG/ACT inhaler Inhale 2 puffs into the lungs every 6 (six) hours as needed for wheezing or shortness of breath.    Yes [provider]  amLODipine (NORVASC) 10 MG tablet Take 10 mg by mouth daily.    Yes [provider]  aspirin EC 81 MG tablet Take 81 mg by mouth daily.   Yes [provider]  carvedilol (COREG) 25 MG tablet Take 25 mg by mouth 2 (two) times daily with a meal.   Yes [provider]  cinacalcet (SENSIPAR) 30 MG tablet Take 2 tablets (60 mg total) by mouth daily with supper. 11/03/16  Yes Dorothea Ogle, MD  insulin lispro (HUMALOG) 100 UNIT/ML injection Inject 3-10 Units into the skin 3 (three) times daily before meals. Per sliding scale: "BGL 200 = 3 units; 250 = 5 units; 300 = 7 units; 350 = 9-10 units"   Yes [provider]  metoCLOPramide (REGLAN) 5 MG tablet Take 1 tablet (5 mg total) by mouth 4 (four) times daily -  before meals and at bedtime. 10/26/14  Yes Vann, Jessica U, DO  mirtazapine (REMERON) 15 MG tablet Take 15 mg by mouth at bedtime.   Yes [provider]  multivitamin (RENA-VIT) TABS tablet Take 1 tablet by mouth at bedtime. Patient taking differently: Take 1 tablet by mouth daily.  11/03/16  Yes  Dorothea Ogle, MD  nitroGLYCERIN (NITROSTAT) 0.4 MG SL tablet PLACE ONE TABLET UNDER THE TONGUE EVERY FIVE MINUTES AS NEEDED FOR CHEST PAIN 04/14/16  Yes Bensimhon, Bevelyn Buckles, MD  pantoprazole (PROTONIX) 40 MG tablet Take 1 tablet (40 mg total) by mouth 2 (two) times daily. 06/05/13  Yes Chikowski, Vinnie Langton, MD  polyethylene glycol (MIRALAX / GLYCOLAX) packet Take 17 g by mouth daily. Patient taking differently: Take 17 g by mouth daily as needed for mild constipation.  10/16/14  Yes Zannie Cove, MD  promethazine (PHENERGAN) 25 MG tablet Take 25 mg by mouth every 4 (four) hours as needed for nausea or vomiting.    Yes [provider]  ranitidine (ZANTAC) 150 MG tablet Take 150 mg by mouth daily as needed for heartburn.   Yes [provider]  rosuvastatin (CRESTOR) 10 MG tablet  Take 1 tablet (10 mg total) by mouth daily. 03/16/16  Yes Comer, Belia Heman, MD  traMADol (ULTRAM) 50 MG tablet Take 50 mg by mouth 2 (two) times daily as needed (for pain).    Yes [provider]  insulin glargine (LANTUS) 100 UNIT/ML injection Inject 0.17 mLs (17 Units total) into the skin at bedtime. Patient not taking: Reported on 12/24/2016 02/12/16   Leatha Gilding, MD   Current Facility-Administered Medications  Medication Dose Route Frequency Provider Last Rate Last Dose  . 0.9 %  sodium chloride infusion   Intravenous Continuous Vanetta Mulders, MD      . acetaminophen (TYLENOL) tablet 650 mg  650 mg Oral Q6H PRN Haydee Salter, MD       Or  . acetaminophen (TYLENOL) suppository 650 mg  650 mg Rectal Q6H PRN Haydee Salter, MD      . albuterol (PROVENTIL HFA;VENTOLIN HFA) 108 (90 Base) MCG/ACT inhaler 2 puff  2 puff Inhalation Q6H PRN Haydee Salter, MD      . amLODipine (NORVASC) tablet 10 mg  10 mg Oral Daily Haydee Salter, MD      . Melene Muller ON 12/25/2016] aspirin EC tablet 81 mg  81 mg Oral Daily Haydee Salter, MD      . carvedilol (COREG) tablet 25 mg  25 mg Oral BID WC  Haydee Salter, MD      . cinacalcet [facility]) tablet 60 mg  60 mg Oral Q supper Haydee Salter, MD      . dextrose 5 %-0.45 % sodium chloride infusion   Intravenous Continuous Vanetta Mulders, MD      . dextrose 50 % solution 25 mL  25 mL Intravenous PRN Vanetta Mulders, MD      . Melene Muller ON 12/26/2016] doxercalciferol (HECTOROL) injection 7 mcg  7 mcg Intravenous Q T,Th,Sa-HD Pola Corn, NP      . famotidine (PEPCID) tablet 20 mg  20 mg Oral Daily Haydee Salter, MD      . heparin injection 5,000 Units  5,000 Units Subcutaneous Q8H Haydee Salter, MD      . hydrALAZINE (APRESOLINE) injection 10 mg  10 mg Intravenous Q8H PRN Haydee Salter, MD      . insulin regular (NOVOLIN R,HUMULIN R) 100 Units in sodium chloride 0.9 % 100 mL (1 Units/mL) infusion   Intravenous Continuous Vanetta Mulders, MD      . insulin regular bolus via infusion 0-10 Units  0-10 Units Intravenous TID WC Vanetta Mulders, MD      . LORazepam (ATIVAN) injection 1-2 mg  1-2 mg Intravenous Q6H PRN Haydee Salter, MD      . mirtazapine (REMERON) tablet 15 mg  15 mg Oral QHS Haydee Salter, MD      . nitroGLYCERIN (NITROSTAT) SL tablet 0.4 mg  0.4 mg Sublingual Q5 min PRN Haydee Salter, MD      . pantoprazole (PROTONIX) EC tablet 40 mg  40 mg Oral BID Haydee Salter, MD      . promethazine (PHENERGAN) tablet 12.5 mg  12.5 mg Oral Q6H PRN Haydee Salter, MD      . rosuvastatin (CRESTOR) tablet 10 mg  10 mg Oral Daily Haydee Salter, MD       Current Outpatient Prescriptions  Medication Sig Dispense Refill  . acetaminophen (TYLENOL) 500 MG tablet Take 500-1,000 mg by mouth every 6 (six) hours as needed (for pain).     Marland Kitchen  albuterol (PROVENTIL HFA;VENTOLIN HFA) 108 (90 BASE) MCG/ACT inhaler Inhale 2 puffs into the lungs every 6 (six) hours as needed for wheezing or shortness of breath.     Marland Kitchen amLODipine (NORVASC) 10 MG tablet Take 10 mg by mouth daily.     Marland Kitchen aspirin EC 81 MG tablet Take 81 mg by  mouth daily.    . carvedilol (COREG) 25 MG tablet Take 25 mg by mouth 2 (two) times daily with a meal.    . cinacalcet (SENSIPAR) 30 MG tablet Take 2 tablets (60 mg total) by mouth daily with supper. 60 tablet 0  . insulin lispro (HUMALOG) 100 UNIT/ML injection Inject 3-10 Units into the skin 3 (three) times daily before meals. Per sliding scale: "BGL 200 = 3 units; 250 = 5 units; 300 = 7 units; 350 = 9-10 units"    . metoCLOPramide (REGLAN) 5 MG tablet Take 1 tablet (5 mg total) by mouth 4 (four) times daily -  before meals and at bedtime. 90 tablet 0  . mirtazapine (REMERON) 15 MG tablet Take 15 mg by mouth at bedtime.    . multivitamin (RENA-VIT) TABS tablet Take 1 tablet by mouth at bedtime. (Patient taking differently: Take 1 tablet by mouth daily. ) 30 tablet 0  . nitroGLYCERIN (NITROSTAT) 0.4 MG SL tablet PLACE ONE TABLET UNDER THE TONGUE EVERY FIVE MINUTES AS NEEDED FOR CHEST PAIN 30 tablet 3  . pantoprazole (PROTONIX) 40 MG tablet Take 1 tablet (40 mg total) by mouth 2 (two) times daily. 60 tablet 1  . polyethylene glycol (MIRALAX / GLYCOLAX) packet Take 17 g by mouth daily. (Patient taking differently: Take 17 g by mouth daily as needed for mild constipation. ) 14 each 0  . promethazine (PHENERGAN) 25 MG tablet Take 25 mg by mouth every 4 (four) hours as needed for nausea or vomiting.     . ranitidine (ZANTAC) 150 MG tablet Take 150 mg by mouth daily as needed for heartburn.    . rosuvastatin (CRESTOR) 10 MG tablet Take 1 tablet (10 mg total) by mouth daily. 30 tablet 11  . traMADol (ULTRAM) 50 MG tablet Take 50 mg by mouth 2 (two) times daily as needed (for pain).     . insulin glargine (LANTUS) 100 UNIT/ML injection Inject 0.17 mLs (17 Units total) into the skin at bedtime. (Patient not taking: Reported on 12/24/2016) 10 mL 11   Labs: Basic Metabolic Panel:  Recent Labs Lab 12/24/16 1130  NA 130*  K 4.7  CL 95*  CO2 23  GLUCOSE 588*  BUN 43*  CREATININE 6.63*  CALCIUM 7.8*     CBC:  Recent Labs Lab 12/24/16 1130  WBC 6.0  NEUTROABS 4.4  HGB 12.5  HCT 39.8  MCV 89.8  PLT 132*   Dg Chest 2 View  Result Date: 12/24/2016 CLINICAL DATA:  Dialysis.  Cough. EXAM: CHEST  2 VIEW COMPARISON:  10/30/2016. FINDINGS: Cardiomegaly with mild pulmonary vascular prominence and bilateral interstitial prominence consistent with mild CHF. No pleural effusion or pneumothorax. IMPRESSION: Findings consistent with congestive heart failure with mild interstitial edema. Electronically Signed   By: Maisie Fus  Register   On: 12/24/2016 12:35   Ct Head Wo Contrast  Result Date: 12/24/2016 CLINICAL DATA:  Seizure with headache EXAM: CT HEAD WITHOUT CONTRAST TECHNIQUE: Contiguous axial images were obtained from the base of the skull through the vertex without intravenous contrast. COMPARISON:  October 29, 2016 FINDINGS: Brain: The ventricles are normal in size and configuration. There is no  intracranial mass, hemorrhage, extra-axial fluid collection, or midline shift. There is mild patchy small vessel disease in the centra semiovale bilaterally. Elsewhere gray-white compartments appear normal. There is no evident acute infarct. Vascular: No hyperdense vessel. There is calcification in each carotid siphon region. There is also calcification in each distal vertebral artery. Skull: Bony calvarium appears intact. Sinuses/Orbits: There is mucosal thickening in several ethmoid air cells bilaterally. Other visualized paranasal sinuses are clear. Visualized orbits appear symmetric bilaterally except for questionable cataract removal on the right. Other: Mastoid air cells are clear. IMPRESSION: Patchy periventricular small vessel disease, essentially stable. No intracranial mass, hemorrhage, or extra-axial fluid collection. No evident acute infarct. Multiple foci of arterial vascular calcification noted. Mucosal thickening in several ethmoid air cells bilaterally. Electronically Signed   By: Bretta Bang  III M.D.   On: 12/24/2016 12:10    ROS: As per HPI otherwise negative.   Physical Exam: Vitals:   12/24/16 1100 12/24/16 1130 12/24/16 1200 12/24/16 1330  BP: (!) 152/129 (!) 176/102 (!) 183/91 (!) 196/83  Pulse: 73 71 70 74  Resp: 17 (!) 23 15 19   Temp:      TempSrc:      SpO2: 100% 100% 99% 100%  Weight:         General: Well nourished, well developed in NAD Head: Normocephalic, atraumatic, sclera non-icteric, mucus membranes are moist Neck: Supple. JVD mildly elevated at 30 degrees.  Lungs: Clear bilaterally to auscultation without wheezes, rales, or rhonchi. Breathing is unlabored. Slightly decreased in bases.  Heart: RRR with S1 S2 2/6 systolic M 3rd ICS LSB. No R/G Abdomen: Soft, non-tender, non-distended with normoactive bowel sounds. No rebound/guarding. No obvious abdominal masses. M-S:  Strength and tone appear normal for age. Lower extremities:without edema or ischemic changes, no open wounds  Neuro: Alert and oriented X 3. Moves all extremities spontaneously. Psych:  Responds to questions appropriately with a normal affect. Dialysis Access: LUA AVF + bruit  Hemodialysis Orders: [facility] T,Th, S 4 hrs 180 NRe 55.5 kg 3.0 K/2.25 Ca 400/Auto 1.5 UFP-4 -Heparin 4000 units IV TIW -Hectorol 7 mcg IV TIW -Mircera 225 mcg IV q 2 weeks (last dose 12/17/16 last HGB 11.4 12/17/16) -Venofer 100 mg IV X 10 doses (has rec'd 4/10 doses last dose 12/19/16 Last Fe 33 Tsat 19% Ferritin 334 12/03/16) LUA AVF + bruit.   BMD meds:  -Sensipar 60 mg PO daily with largest meal -Renvela 800 mg 3 tabs PO TID A/C (last phos 5.8 Ca 8.4 12/17/16)  Assessment/Plan: 1.  Witnessed seizure activity: No further seizure activity since arrival to ED. CT negative. Per primary. Neurology consulted.  2.  Hyperglycemia: BS 500s normal AG Na 130. Probably HONK. Started on insulin gtt 3.  ESRD -  T,Th, S missed HD 08/14. Will have HD today. K+ 4.7. Usually dialyzes on 3.0 K bath. Usual heparin dose.   4.  Hypertension/volume  - BP elevated in ED. Mild interstitial edema on CXR. Has not been getting to OP EDW, however has been leaving ABOVE EDW. Amlodipine 10 mg PO Q hs, Coreg 25 mg PO BID on OP med list. Will continue. Get standing wt in HD attempt to get to EDW 56 kg.  5.  Anemia  - HGB 12.5 No ESA needed at present. Continue Fe load.  6.  Metabolic bone disease - Continue binders, sensipar, VDRA. Ca 7.5 C Ca 8.2 Phos 6.3 7.  Nutrition - albumin 3.1.NPO at present. Renal/Carb mod diet when able to eat. Renal  vit/Nepro.  8. DM: as noted above 9. H/O HF: continue coreg, monitor volume.    H. Manson PasseyBrown, NP-C 12/24/2016, 1:43 PM  [facility]per [phone]

## 2016-12-24 NOTE — ED Notes (Signed)
Pt is up and walking around the halls.  Pt wishes to leave. Pt is A&Ox4. This RN and Dr Deretha EmoryZackowski spent 15 min with the Pt trying to convince her to stay.  Pt is aware of critical CBG, the need for dialysis, and that she is at risk of dying if she does not stay and be admitted.  Pt wants IV removed from hand.  Pt called family and explained to them that she wants to go home.  Pt made family aware that we want to keep her and of how dangerous it is if she leaves.  Pt's family is coming up to pick up Pt. This RN is waiting to remove IV until family is here so that myself and the provider may plea with them to have her stay.

## 2016-12-24 NOTE — Progress Notes (Deleted)
Madeline [name] is a 61 Y/O AA female with ESRD [facility]. PMH significant for DM, HTN, cardiorenal syndrome, CVA, pulmonary HTN, HFpEF (EF 60-65% 04/07/16), CAD, Hep C, pancreatitis. She has had multiple admissions for multiple for HONK-last admission 10/29/16. Last hemodialysis was 12/19/16 and she did stay for full treatment. H/O of occasionally missing or shortening treatments.   Patient states she missed HD 12/22/16 D/T diarrhea and headache. She says she was getting dressed for scheduled hemodialysis and "that is the last I remember! My family said I had a seizure but I don't remember anything!". Per ER notes, family reported witnessed seizure activity lasting 3-5 minutes and called EMS. EMS reported patient being post ictal on arrival to scene, BS 500s. [facility]. CT of head negative for acute infarct, intracranial mass or hemorrhage. BS 588 Na 130 Co2 23 normal AG. Scr 6.6 BUN 43 K+ 4.7 HGB 12.5 WBC 6.0. CXR with mild interstitial edema.  She was afebrile on adm, BP 185/89 HR 67 RR 14. Currently, she is awake, alert, oriented X 3 and at baseline mentally. She still C/O headache but no diarrhea today. Says she did take insulin this AM.   Unsure of disposition at present but she will not be able to attend HD at center today. Not overtly volume overloaded but does has mild interstitial edema on CXR. Will enter HD orders for later this afternoon. She has not gotten to EDW 55.5 kg in last 4 treatments, leaving 0.7-2.1 above EDW. Raise 0.5 kg.  Will consult formally if patient is admitted as in-patient.   Hemodialysis Orders: [facility] T,Th, S 4 hrs 180 NRe 55.5 kg 3.0 K/2.25 Ca 400/Auto 1.5 UFP-4 -Heparin 4000 units IV TIW -Hectorol 7 mcg IV TIW -Mircera 225 mcg IV q 2 weeks (last dose 12/17/16 last HGB 11.4 12/17/16) -Venofer 100 mg IV X 10 doses (has rec'd 4/10 doses last dose 12/19/16 Last Fe 33 Tsat 19% Ferritin 334  12/03/16) LUA AVF + bruit.   BMD meds:  -Sensipar 60 mg PO daily with largest meal -Renvela 800 mg 3 tabs PO TID A/C (last phos 5.8 Ca 8.4 12/17/16)   Alonna Bucklerita Egon Dittus NP-C [facility] [phone] (pager)

## 2017-01-04 ENCOUNTER — Inpatient Hospital Stay (HOSPITAL_COMMUNITY)
Admission: EM | Admit: 2017-01-04 | Discharge: 2017-01-10 | DRG: 100 | Disposition: A | Discharge status: Home / Self Care | Payer: Medicaid Other | Attending: Family Medicine | Admitting: Family Medicine

## 2017-01-04 DIAGNOSIS — G4733 Obstructive sleep apnea (adult) (pediatric): Secondary | ICD-10-CM | POA: Diagnosis present

## 2017-01-04 DIAGNOSIS — G40411 Other generalized epilepsy and epileptic syndromes, intractable, with status epilepticus: Principal | ICD-10-CM | POA: Diagnosis present

## 2017-01-04 DIAGNOSIS — I132 Hypertensive heart and chronic kidney disease with heart failure and with stage 5 chronic kidney disease, or end stage renal disease: Secondary | ICD-10-CM | POA: Diagnosis present

## 2017-01-04 DIAGNOSIS — I5033 Acute on chronic diastolic (congestive) heart failure: Secondary | ICD-10-CM | POA: Diagnosis not present

## 2017-01-04 DIAGNOSIS — I2729 Other secondary pulmonary hypertension: Secondary | ICD-10-CM | POA: Diagnosis present

## 2017-01-04 DIAGNOSIS — G40901 Epilepsy, unspecified, not intractable, with status epilepticus: Secondary | ICD-10-CM | POA: Diagnosis present

## 2017-01-04 DIAGNOSIS — I2510 Atherosclerotic heart disease of native coronary artery without angina pectoris: Secondary | ICD-10-CM | POA: Diagnosis present

## 2017-01-04 DIAGNOSIS — G9340 Encephalopathy, unspecified: Secondary | ICD-10-CM | POA: Diagnosis present

## 2017-01-04 DIAGNOSIS — J9690 Respiratory failure, unspecified, unspecified whether with hypoxia or hypercapnia: Secondary | ICD-10-CM

## 2017-01-04 DIAGNOSIS — E1100 Type 2 diabetes mellitus with hyperosmolarity without nonketotic hyperglycemic-hyperosmolar coma (NKHHC): Secondary | ICD-10-CM | POA: Diagnosis present

## 2017-01-04 DIAGNOSIS — E875 Hyperkalemia: Secondary | ICD-10-CM | POA: Diagnosis not present

## 2017-01-04 DIAGNOSIS — B182 Chronic viral hepatitis C: Secondary | ICD-10-CM | POA: Diagnosis present

## 2017-01-04 DIAGNOSIS — E785 Hyperlipidemia, unspecified: Secondary | ICD-10-CM | POA: Diagnosis present

## 2017-01-04 DIAGNOSIS — Z9911 Dependence on respirator [ventilator] status: Secondary | ICD-10-CM

## 2017-01-04 DIAGNOSIS — E1151 Type 2 diabetes mellitus with diabetic peripheral angiopathy without gangrene: Secondary | ICD-10-CM | POA: Diagnosis present

## 2017-01-04 DIAGNOSIS — R739 Hyperglycemia, unspecified: Secondary | ICD-10-CM

## 2017-01-04 DIAGNOSIS — N186 End stage renal disease: Secondary | ICD-10-CM

## 2017-01-04 DIAGNOSIS — Z8673 Personal history of transient ischemic attack (TIA), and cerebral infarction without residual deficits: Secondary | ICD-10-CM

## 2017-01-04 DIAGNOSIS — Z833 Family history of diabetes mellitus: Secondary | ICD-10-CM

## 2017-01-04 DIAGNOSIS — I252 Old myocardial infarction: Secondary | ICD-10-CM

## 2017-01-04 DIAGNOSIS — Z7982 Long term (current) use of aspirin: Secondary | ICD-10-CM

## 2017-01-04 DIAGNOSIS — J9602 Acute respiratory failure with hypercapnia: Secondary | ICD-10-CM | POA: Diagnosis present

## 2017-01-04 DIAGNOSIS — J449 Chronic obstructive pulmonary disease, unspecified: Secondary | ICD-10-CM | POA: Diagnosis present

## 2017-01-04 DIAGNOSIS — J9601 Acute respiratory failure with hypoxia: Secondary | ICD-10-CM | POA: Diagnosis present

## 2017-01-04 DIAGNOSIS — E11649 Type 2 diabetes mellitus with hypoglycemia without coma: Secondary | ICD-10-CM | POA: Diagnosis not present

## 2017-01-04 DIAGNOSIS — Z720 Tobacco use: Secondary | ICD-10-CM

## 2017-01-04 DIAGNOSIS — N2581 Secondary hyperparathyroidism of renal origin: Secondary | ICD-10-CM | POA: Diagnosis present

## 2017-01-04 DIAGNOSIS — Z9119 Patient's noncompliance with other medical treatment and regimen: Secondary | ICD-10-CM

## 2017-01-04 DIAGNOSIS — D649 Anemia, unspecified: Secondary | ICD-10-CM | POA: Diagnosis present

## 2017-01-04 DIAGNOSIS — Z992 Dependence on renal dialysis: Secondary | ICD-10-CM

## 2017-01-04 DIAGNOSIS — Z79899 Other long term (current) drug therapy: Secondary | ICD-10-CM

## 2017-01-04 DIAGNOSIS — I161 Hypertensive emergency: Secondary | ICD-10-CM | POA: Diagnosis present

## 2017-01-04 DIAGNOSIS — D696 Thrombocytopenia, unspecified: Secondary | ICD-10-CM | POA: Diagnosis not present

## 2017-01-04 DIAGNOSIS — Z794 Long term (current) use of insulin: Secondary | ICD-10-CM

## 2017-01-04 DIAGNOSIS — Z9115 Patient's noncompliance with renal dialysis: Secondary | ICD-10-CM

## 2017-01-05 ENCOUNTER — Emergency Department (HOSPITAL_COMMUNITY): Payer: Medicaid Other

## 2017-01-05 ENCOUNTER — Encounter (HOSPITAL_COMMUNITY): Payer: Self-pay | Admitting: Emergency Medicine

## 2017-01-05 ENCOUNTER — Inpatient Hospital Stay (HOSPITAL_COMMUNITY): Payer: Medicaid Other

## 2017-01-05 DIAGNOSIS — E11649 Type 2 diabetes mellitus with hypoglycemia without coma: Secondary | ICD-10-CM | POA: Diagnosis not present

## 2017-01-05 DIAGNOSIS — G4733 Obstructive sleep apnea (adult) (pediatric): Secondary | ICD-10-CM | POA: Diagnosis present

## 2017-01-05 DIAGNOSIS — N186 End stage renal disease: Secondary | ICD-10-CM | POA: Diagnosis present

## 2017-01-05 DIAGNOSIS — E875 Hyperkalemia: Secondary | ICD-10-CM | POA: Diagnosis not present

## 2017-01-05 DIAGNOSIS — Z992 Dependence on renal dialysis: Secondary | ICD-10-CM | POA: Diagnosis not present

## 2017-01-05 DIAGNOSIS — I132 Hypertensive heart and chronic kidney disease with heart failure and with stage 5 chronic kidney disease, or end stage renal disease: Secondary | ICD-10-CM | POA: Diagnosis present

## 2017-01-05 DIAGNOSIS — R739 Hyperglycemia, unspecified: Secondary | ICD-10-CM | POA: Diagnosis not present

## 2017-01-05 DIAGNOSIS — E785 Hyperlipidemia, unspecified: Secondary | ICD-10-CM | POA: Diagnosis present

## 2017-01-05 DIAGNOSIS — G40411 Other generalized epilepsy and epileptic syndromes, intractable, with status epilepticus: Secondary | ICD-10-CM | POA: Diagnosis present

## 2017-01-05 DIAGNOSIS — J449 Chronic obstructive pulmonary disease, unspecified: Secondary | ICD-10-CM | POA: Diagnosis present

## 2017-01-05 DIAGNOSIS — I161 Hypertensive emergency: Secondary | ICD-10-CM

## 2017-01-05 DIAGNOSIS — D696 Thrombocytopenia, unspecified: Secondary | ICD-10-CM | POA: Diagnosis not present

## 2017-01-05 DIAGNOSIS — I2510 Atherosclerotic heart disease of native coronary artery without angina pectoris: Secondary | ICD-10-CM | POA: Diagnosis present

## 2017-01-05 DIAGNOSIS — D649 Anemia, unspecified: Secondary | ICD-10-CM | POA: Diagnosis present

## 2017-01-05 DIAGNOSIS — E1100 Type 2 diabetes mellitus with hyperosmolarity without nonketotic hyperglycemic-hyperosmolar coma (NKHHC): Secondary | ICD-10-CM | POA: Diagnosis present

## 2017-01-05 DIAGNOSIS — J9602 Acute respiratory failure with hypercapnia: Secondary | ICD-10-CM | POA: Diagnosis present

## 2017-01-05 DIAGNOSIS — Z833 Family history of diabetes mellitus: Secondary | ICD-10-CM | POA: Diagnosis not present

## 2017-01-05 DIAGNOSIS — G40901 Epilepsy, unspecified, not intractable, with status epilepticus: Secondary | ICD-10-CM

## 2017-01-05 DIAGNOSIS — I5033 Acute on chronic diastolic (congestive) heart failure: Secondary | ICD-10-CM | POA: Diagnosis not present

## 2017-01-05 DIAGNOSIS — Z720 Tobacco use: Secondary | ICD-10-CM | POA: Diagnosis not present

## 2017-01-05 DIAGNOSIS — G9340 Encephalopathy, unspecified: Secondary | ICD-10-CM | POA: Diagnosis not present

## 2017-01-05 DIAGNOSIS — I252 Old myocardial infarction: Secondary | ICD-10-CM | POA: Diagnosis not present

## 2017-01-05 DIAGNOSIS — B182 Chronic viral hepatitis C: Secondary | ICD-10-CM | POA: Diagnosis present

## 2017-01-05 DIAGNOSIS — E1101 Type 2 diabetes mellitus with hyperosmolarity with coma: Secondary | ICD-10-CM | POA: Diagnosis not present

## 2017-01-05 DIAGNOSIS — Z9115 Patient's noncompliance with renal dialysis: Secondary | ICD-10-CM | POA: Diagnosis not present

## 2017-01-05 DIAGNOSIS — J9601 Acute respiratory failure with hypoxia: Secondary | ICD-10-CM | POA: Diagnosis present

## 2017-01-05 DIAGNOSIS — I2729 Other secondary pulmonary hypertension: Secondary | ICD-10-CM | POA: Diagnosis present

## 2017-01-05 DIAGNOSIS — N2581 Secondary hyperparathyroidism of renal origin: Secondary | ICD-10-CM | POA: Diagnosis present

## 2017-01-05 LAB — GLUCOSE, CAPILLARY
GLUCOSE-CAPILLARY: 116 mg/dL — AB (ref 65–99)
GLUCOSE-CAPILLARY: 110 mg/dL — AB (ref 65–99)
GLUCOSE-CAPILLARY: 118 mg/dL — AB (ref 65–99)
GLUCOSE-CAPILLARY: 108 mg/dL — AB (ref 65–99)
Glucose-Capillary: 166 mg/dL — ABNORMAL HIGH (ref 65–99)
Glucose-Capillary: 103 mg/dL — ABNORMAL HIGH (ref 65–99)
Glucose-Capillary: 112 mg/dL — ABNORMAL HIGH (ref 65–99)
Glucose-Capillary: 144 mg/dL — ABNORMAL HIGH (ref 65–99)

## 2017-01-05 LAB — BASIC METABOLIC PANEL
Anion gap: 14 (ref 5–15)
BUN: 26 mg/dL — AB (ref 6–20)
CHLORIDE: 93 mmol/L — AB (ref 101–111)
CO2: 23 mmol/L (ref 22–32)
Calcium: 8.1 mg/dL — ABNORMAL LOW (ref 8.9–10.3)
Creatinine, Ser: 5.23 mg/dL — ABNORMAL HIGH (ref 0.44–1.00)
GFR calc Af Amer: 9 mL/min — ABNORMAL LOW (ref >60)
GFR calc non Af Amer: 8 mL/min — ABNORMAL LOW (ref >60)
Glucose, Bld: 662 mg/dL (ref 65–99)
POTASSIUM: 3.1 mmol/L — AB (ref 3.5–5.1)
SODIUM: 130 mmol/L — AB (ref 135–145)

## 2017-01-05 LAB — COMPREHENSIVE METABOLIC PANEL
ALBUMIN: 3.9 g/dL (ref 3.5–5.0)
ALK PHOS: 158 U/L — AB (ref 38–126)
ALT: 12 U/L — ABNORMAL LOW (ref 14–54)
ANION GAP: 17 — AB (ref 5–15)
AST: 23 U/L (ref 15–41)
BILIRUBIN TOTAL: 0.6 mg/dL (ref 0.3–1.2)
BUN: 25 mg/dL — ABNORMAL HIGH (ref 6–20)
CALCIUM: 9.2 mg/dL (ref 8.9–10.3)
CO2: 25 mmol/L (ref 22–32)
Chloride: 88 mmol/L — ABNORMAL LOW (ref 101–111)
Creatinine, Ser: 5.4 mg/dL — ABNORMAL HIGH (ref 0.44–1.00)
GFR calc Af Amer: 9 mL/min — ABNORMAL LOW (ref >60)
GFR, EST NON AFRICAN AMERICAN: 8 mL/min — AB (ref >60)
GLUCOSE: 772 mg/dL — AB (ref 65–99)
POTASSIUM: 3.5 mmol/L (ref 3.5–5.1)
Sodium: 130 mmol/L — ABNORMAL LOW (ref 135–145)
TOTAL PROTEIN: 8.1 g/dL (ref 6.5–8.1)

## 2017-01-05 LAB — CBC
HCT: 38.3 % (ref 36.0–46.0)
HEMATOCRIT: 39.9 % (ref 36.0–46.0)
Hemoglobin: 12.5 g/dL (ref 12.0–15.0)
Hemoglobin: 11.9 g/dL — ABNORMAL LOW (ref 12.0–15.0)
MCH: 27.9 pg (ref 26.0–34.0)
MCH: 27.5 pg (ref 26.0–34.0)
MCHC: 31.3 g/dL (ref 30.0–36.0)
MCHC: 31.1 g/dL (ref 30.0–36.0)
MCV: 89.1 fL (ref 78.0–100.0)
MCV: 88.5 fL (ref 78.0–100.0)
Platelets: 164 10*3/uL (ref 150–400)
Platelets: 144 10*3/uL — ABNORMAL LOW (ref 150–400)
RBC: 4.48 MIL/uL (ref 3.87–5.11)
RBC: 4.33 MIL/uL (ref 3.87–5.11)
RDW: 15.4 % (ref 11.5–15.5)
RDW: 15.4 % (ref 11.5–15.5)
WBC: 12 10*3/uL — AB (ref 4.0–10.5)
WBC: 4.5 10*3/uL (ref 4.0–10.5)

## 2017-01-05 LAB — I-STAT ARTERIAL BLOOD GAS, ED
ACID-BASE EXCESS: 4 mmol/L — AB (ref 0.0–2.0)
Acid-Base Excess: 2 mmol/L (ref 0.0–2.0)
BICARBONATE: 26.3 mmol/L (ref 20.0–28.0)
Bicarbonate: 33.9 mmol/L — ABNORMAL HIGH (ref 20.0–28.0)
O2 SAT: 100 %
O2 Saturation: 100 %
PCO2 ART: 68.9 mmHg — AB (ref 32.0–48.0)
PH ART: 7.3 — AB (ref 7.350–7.450)
PO2 ART: 170 mmHg — AB (ref 83.0–108.0)
PO2 ART: 456 mmHg — AB (ref 83.0–108.0)
Patient temperature: 98.6
Patient temperature: 98.6
TCO2: 27 mmol/L (ref 22–32)
TCO2: 36 mmol/L — AB (ref 22–32)
pCO2 arterial: 37.7 mmHg (ref 32.0–48.0)
pH, Arterial: 7.452 — ABNORMAL HIGH (ref 7.350–7.450)

## 2017-01-05 LAB — RENAL FUNCTION PANEL
ALBUMIN: 2.4 g/dL — AB (ref 3.5–5.0)
Anion gap: 9 (ref 5–15)
BUN: 20 mg/dL (ref 6–20)
CALCIUM: 8.3 mg/dL — AB (ref 8.9–10.3)
CO2: 26 mmol/L (ref 22–32)
CREATININE: 4.5 mg/dL — AB (ref 0.44–1.00)
Chloride: 101 mmol/L (ref 101–111)
GFR calc Af Amer: 11 mL/min — ABNORMAL LOW (ref >60)
GFR calc non Af Amer: 10 mL/min — ABNORMAL LOW (ref >60)
GLUCOSE: 85 mg/dL (ref 65–99)
PHOSPHORUS: 3.8 mg/dL (ref 2.5–4.6)
Potassium: 2.6 mmol/L — CL (ref 3.5–5.1)
SODIUM: 136 mmol/L (ref 135–145)

## 2017-01-05 LAB — CBC WITH DIFFERENTIAL/PLATELET
Basophils Absolute: 0 10*3/uL (ref 0.0–0.1)
Basophils Relative: 0 %
Eosinophils Absolute: 0 10*3/uL (ref 0.0–0.7)
Eosinophils Relative: 0 %
HEMATOCRIT: 48.4 % — AB (ref 36.0–46.0)
HEMOGLOBIN: 15.2 g/dL — AB (ref 12.0–15.0)
LYMPHS ABS: 1.5 10*3/uL (ref 0.7–4.0)
LYMPHS PCT: 18 %
MCH: 28.9 pg (ref 26.0–34.0)
MCHC: 31.4 g/dL (ref 30.0–36.0)
MCV: 92 fL (ref 78.0–100.0)
MONOS PCT: 9 %
Monocytes Absolute: 0.8 10*3/uL (ref 0.1–1.0)
NEUTROS ABS: 6.4 10*3/uL (ref 1.7–7.7)
NEUTROS PCT: 73 %
Platelets: 154 10*3/uL (ref 150–400)
RBC: 5.26 MIL/uL — AB (ref 3.87–5.11)
RDW: 15.6 % — ABNORMAL HIGH (ref 11.5–15.5)
WBC: 8.7 10*3/uL (ref 4.0–10.5)

## 2017-01-05 LAB — I-STAT CHEM 8, ED
BUN: 29 mg/dL — AB (ref 6–20)
CHLORIDE: 92 mmol/L — AB (ref 101–111)
CREATININE: 5.1 mg/dL — AB (ref 0.44–1.00)
Calcium, Ion: 1.04 mmol/L — ABNORMAL LOW (ref 1.15–1.40)
Glucose, Bld: >700 mg/dL (ref 65–99)
HEMATOCRIT: 55 % — AB (ref 36.0–46.0)
Hemoglobin: 18.7 g/dL — ABNORMAL HIGH (ref 12.0–15.0)
POTASSIUM: 3.5 mmol/L (ref 3.5–5.1)
Sodium: 130 mmol/L — ABNORMAL LOW (ref 135–145)
TCO2: 25 mmol/L (ref 22–32)

## 2017-01-05 LAB — I-STAT TROPONIN, ED: Troponin i, poc: 0 ng/mL (ref 0.00–0.08)

## 2017-01-05 LAB — I-STAT CG4 LACTIC ACID, ED: LACTIC ACID, VENOUS: 2.13 mmol/L — AB (ref 0.5–1.9)

## 2017-01-05 LAB — ETHANOL: Alcohol, Ethyl (B): <5 mg/dL (ref <5)

## 2017-01-05 LAB — CBG MONITORING, ED
Glucose-Capillary: 357 mg/dL — ABNORMAL HIGH (ref 65–99)
Glucose-Capillary: 490 mg/dL — ABNORMAL HIGH (ref 65–99)
Glucose-Capillary: >600 mg/dL (ref 65–99)
Glucose-Capillary: >600 mg/dL (ref 65–99)

## 2017-01-05 LAB — BETA-HYDROXYBUTYRIC ACID: Beta-Hydroxybutyric Acid: 0.91 mmol/L — ABNORMAL HIGH (ref 0.05–0.27)

## 2017-01-05 LAB — TROPONIN I
Troponin I: 0.11 ng/mL (ref <0.03)
Troponin I: 0.18 ng/mL (ref <0.03)
Troponin I: 0.13 ng/mL (ref <0.03)

## 2017-01-05 LAB — LACTIC ACID, PLASMA: LACTIC ACID, VENOUS: 2.3 mmol/L — AB (ref 0.5–1.9)

## 2017-01-05 LAB — TRIGLYCERIDES: Triglycerides: 672 mg/dL — ABNORMAL HIGH (ref <150)

## 2017-01-05 LAB — ACETAMINOPHEN LEVEL

## 2017-01-05 LAB — SALICYLATE LEVEL

## 2017-01-05 LAB — PHOSPHORUS: Phosphorus: 5.2 mg/dL — ABNORMAL HIGH (ref 2.5–4.6)

## 2017-01-05 LAB — AMMONIA: Ammonia: 31 umol/L (ref 9–35)

## 2017-01-05 LAB — MAGNESIUM: MAGNESIUM: 2.1 mg/dL (ref 1.7–2.4)

## 2017-01-05 MED ORDER — AMLODIPINE BESYLATE 10 MG PO TABS
10.0000 mg | ORAL_TABLET | Freq: Every day | ORAL | Status: DC
  Administered 2017-01-05 – 2017-01-06 (×2): 10 mg
  Filled 2017-01-05 (×2): qty 1

## 2017-01-05 MED ORDER — SUCCINYLCHOLINE CHLORIDE 20 MG/ML IJ SOLN
INTRAMUSCULAR | Status: AC | PRN
  Administered 2017-01-05: 100 mg via INTRAVENOUS

## 2017-01-05 MED ORDER — CINACALCET HCL 30 MG PO TABS
60.0000 mg | ORAL_TABLET | Freq: Every day | ORAL | Status: DC
  Administered 2017-01-05 – 2017-01-10 (×4): 60 mg via ORAL
  Filled 2017-01-05 (×6): qty 2

## 2017-01-05 MED ORDER — DEXTROSE-NACL 5-0.45 % IV SOLN
INTRAVENOUS | Status: DC
  Administered 2017-01-05: 08:00:00 via INTRAVENOUS

## 2017-01-05 MED ORDER — MIDAZOLAM HCL 5 MG/5ML IJ SOLN
INTRAMUSCULAR | Status: AC | PRN
  Administered 2017-01-05: 5 mg via INTRAVENOUS

## 2017-01-05 MED ORDER — LORAZEPAM 2 MG/ML IJ SOLN: 2.0000 mg | INTRAMUSCULAR | Status: DC | PRN

## 2017-01-05 MED ORDER — CHLORHEXIDINE GLUCONATE 0.12% ORAL RINSE (MEDLINE KIT)
15.0000 mL | Freq: Two times a day (BID) | OROMUCOSAL | Status: DC
  Administered 2017-01-05 – 2017-01-10 (×9): 15 mL via OROMUCOSAL

## 2017-01-05 MED ORDER — ORAL CARE MOUTH RINSE
15.0000 mL | OROMUCOSAL | Status: DC
  Administered 2017-01-05 – 2017-01-07 (×20): 15 mL via OROMUCOSAL

## 2017-01-05 MED ORDER — NICARDIPINE HCL IN NACL 20-0.86 MG/200ML-% IV SOLN
3.0000 mg/h | Freq: Once | INTRAVENOUS | Status: AC
  Administered 2017-01-05: 5 mg/h via INTRAVENOUS
  Filled 2017-01-05: qty 200

## 2017-01-05 MED ORDER — SODIUM CHLORIDE 0.9 % IV SOLN
INTRAVENOUS | Status: DC
  Administered 2017-01-05: 02:00:00 via INTRAVENOUS

## 2017-01-05 MED ORDER — LEVETIRACETAM 500 MG/5ML IV SOLN
500.0000 mg | Freq: Two times a day (BID) | INTRAVENOUS | Status: DC
  Administered 2017-01-05 – 2017-01-07 (×5): 500 mg via INTRAVENOUS
  Filled 2017-01-05 (×5): qty 5

## 2017-01-05 MED ORDER — SODIUM CHLORIDE 0.9 % IV SOLN
INTRAVENOUS | Status: DC
  Administered 2017-01-05: 05:00:00 via INTRAVENOUS

## 2017-01-05 MED ORDER — CARVEDILOL 12.5 MG PO TABS: 25.0000 mg | ORAL_TABLET | Freq: Two times a day (BID) | ORAL | Status: DC

## 2017-01-05 MED ORDER — ETOMIDATE 2 MG/ML IV SOLN
INTRAVENOUS | Status: AC | PRN
  Administered 2017-01-05: 20 mg via INTRAVENOUS

## 2017-01-05 MED ORDER — CARVEDILOL 12.5 MG PO TABS
25.0000 mg | ORAL_TABLET | Freq: Two times a day (BID) | ORAL | Status: DC
  Administered 2017-01-05 – 2017-01-07 (×5): 25 mg
  Filled 2017-01-05 (×5): qty 2

## 2017-01-05 MED ORDER — NICARDIPINE HCL IN NACL 20-0.86 MG/200ML-% IV SOLN
3.0000 mg/h | INTRAVENOUS | Status: DC
  Administered 2017-01-05: 3 mg/h via INTRAVENOUS
  Filled 2017-01-05: qty 200

## 2017-01-05 MED ORDER — PROPOFOL 1000 MG/100ML IV EMUL
5.0000 ug/kg/min | INTRAVENOUS | Status: DC
  Administered 2017-01-05: 50 ug/kg/min via INTRAVENOUS
  Administered 2017-01-05 (×2): 40 ug/kg/min via INTRAVENOUS
  Administered 2017-01-05: 50 ug/kg/min via INTRAVENOUS
  Administered 2017-01-06: 30 ug/kg/min via INTRAVENOUS
  Filled 2017-01-05 (×4): qty 100

## 2017-01-05 MED ORDER — PROPOFOL 10 MG/ML IV BOLUS
INTRAVENOUS | Status: AC
  Filled 2017-01-05: qty 20

## 2017-01-05 MED ORDER — BUDESONIDE 0.5 MG/2ML IN SUSP
0.5000 mg | Freq: Two times a day (BID) | RESPIRATORY_TRACT | Status: DC
  Administered 2017-01-05 – 2017-01-10 (×10): 0.5 mg via RESPIRATORY_TRACT
  Filled 2017-01-05 (×11): qty 2

## 2017-01-05 MED ORDER — ETOMIDATE 2 MG/ML IV SOLN: 20.0000 mg | Freq: Once | INTRAVENOUS | Status: DC

## 2017-01-05 MED ORDER — INSULIN REGULAR BOLUS VIA INFUSION
0.0000 [IU] | Freq: Three times a day (TID) | INTRAVENOUS | Status: DC
  Filled 2017-01-05: qty 10

## 2017-01-05 MED ORDER — RENA-VITE PO TABS
1.0000 | ORAL_TABLET | Freq: Every day | ORAL | Status: DC
  Administered 2017-01-05 – 2017-01-10 (×5): 1 via ORAL
  Filled 2017-01-05 (×6): qty 1

## 2017-01-05 MED ORDER — INSULIN ASPART 100 UNIT/ML ~~LOC~~ SOLN
1.0000 [IU] | SUBCUTANEOUS | Status: DC
  Administered 2017-01-05 – 2017-01-06 (×3): 1 [IU] via SUBCUTANEOUS
  Administered 2017-01-06: 2 [IU] via SUBCUTANEOUS
  Administered 2017-01-06: 1 [IU] via SUBCUTANEOUS
  Administered 2017-01-07: 2 [IU] via SUBCUTANEOUS
  Administered 2017-01-08: 3 [IU] via SUBCUTANEOUS
  Administered 2017-01-08: 1 [IU] via SUBCUTANEOUS
  Administered 2017-01-08: 3 [IU] via SUBCUTANEOUS
  Administered 2017-01-09 (×2): 2 [IU] via SUBCUTANEOUS
  Administered 2017-01-09 – 2017-01-10 (×2): 3 [IU] via SUBCUTANEOUS
  Administered 2017-01-10: 1 [IU] via SUBCUTANEOUS

## 2017-01-05 MED ORDER — PROPOFOL 500 MG/50ML IV EMUL
INTRAVENOUS | Status: AC
  Filled 2017-01-05: qty 50

## 2017-01-05 MED ORDER — ROSUVASTATIN CALCIUM 10 MG PO TABS
10.0000 mg | ORAL_TABLET | Freq: Every day | ORAL | Status: DC
  Administered 2017-01-05 – 2017-01-06 (×2): 10 mg
  Filled 2017-01-05 (×3): qty 1

## 2017-01-05 MED ORDER — SUCCINYLCHOLINE CHLORIDE 20 MG/ML IJ SOLN
100.0000 mg | Freq: Once | INTRAMUSCULAR | Status: DC
  Filled 2017-01-05: qty 5

## 2017-01-05 MED ORDER — SODIUM CHLORIDE 0.9 % IV SOLN: 250.0000 mL | INTRAVENOUS | Status: DC | PRN

## 2017-01-05 MED ORDER — AMLODIPINE BESYLATE 10 MG PO TABS: 10.0000 mg | ORAL_TABLET | Freq: Every day | ORAL | Status: DC

## 2017-01-05 MED ORDER — IPRATROPIUM-ALBUTEROL 0.5-2.5 (3) MG/3ML IN SOLN
3.0000 mL | Freq: Four times a day (QID) | RESPIRATORY_TRACT | Status: DC
  Administered 2017-01-05 – 2017-01-06 (×6): 3 mL via RESPIRATORY_TRACT
  Filled 2017-01-05 (×6): qty 3

## 2017-01-05 MED ORDER — DEXTROSE 10 % IV SOLN
INTRAVENOUS | Status: DC | PRN
  Administered 2017-01-05 – 2017-01-09 (×3): via INTRAVENOUS
  Filled 2017-01-05 (×2): qty 1000

## 2017-01-05 MED ORDER — MIDAZOLAM HCL 2 MG/2ML IJ SOLN
INTRAMUSCULAR | Status: AC
  Filled 2017-01-05: qty 6

## 2017-01-05 MED ORDER — ASPIRIN EC 81 MG PO TBEC
81.0000 mg | DELAYED_RELEASE_TABLET | Freq: Every day | ORAL | Status: DC
  Administered 2017-01-05 – 2017-01-10 (×4): 81 mg via ORAL
  Filled 2017-01-05 (×4): qty 1

## 2017-01-05 MED ORDER — DEXTROSE-NACL 5-0.45 % IV SOLN: INTRAVENOUS | Status: DC

## 2017-01-05 MED ORDER — INSULIN REGULAR HUMAN 100 UNIT/ML IJ SOLN
INTRAMUSCULAR | Status: DC
  Administered 2017-01-05: 5.4 [IU]/h via INTRAVENOUS
  Filled 2017-01-05: qty 1

## 2017-01-05 MED ORDER — SODIUM CHLORIDE 0.9 % IV BOLUS (SEPSIS)
1000.0000 mL | Freq: Once | INTRAVENOUS | Status: AC
  Administered 2017-01-05: 1000 mL via INTRAVENOUS

## 2017-01-05 MED ORDER — SODIUM CHLORIDE 0.9 % IV SOLN
1000.0000 mg | Freq: Once | INTRAVENOUS | Status: AC
  Administered 2017-01-05: 1000 mg via INTRAVENOUS
  Filled 2017-01-05: qty 10

## 2017-01-05 MED ORDER — PANTOPRAZOLE SODIUM 40 MG IV SOLR
40.0000 mg | Freq: Every day | INTRAVENOUS | Status: DC
  Administered 2017-01-05 – 2017-01-08 (×4): 40 mg via INTRAVENOUS
  Filled 2017-01-05 (×4): qty 40

## 2017-01-05 MED ORDER — PROPOFOL 1000 MG/100ML IV EMUL
5.0000 ug/kg/min | Freq: Once | INTRAVENOUS | Status: AC
  Administered 2017-01-05: 5 ug/kg/min via INTRAVENOUS
  Filled 2017-01-05: qty 100

## 2017-01-05 MED ORDER — PROPOFOL 1000 MG/100ML IV EMUL
INTRAVENOUS | Status: AC
  Administered 2017-01-05: 07:00:00
  Filled 2017-01-05: qty 100

## 2017-01-05 MED ORDER — MIDAZOLAM HCL 2 MG/2ML IJ SOLN: 5.0000 mg | Freq: Once | INTRAMUSCULAR | Status: DC

## 2017-01-05 MED ORDER — DEXTROSE 50 % IV SOLN: 25.0000 mL | INTRAVENOUS | Status: DC | PRN

## 2017-01-05 MED ORDER — HEPARIN SODIUM (PORCINE) 5000 UNIT/ML IJ SOLN
5000.0000 [IU] | Freq: Three times a day (TID) | INTRAMUSCULAR | Status: DC
  Administered 2017-01-05 – 2017-01-10 (×16): 5000 [IU] via SUBCUTANEOUS
  Filled 2017-01-05 (×15): qty 1

## 2017-01-05 MED ORDER — ROSUVASTATIN CALCIUM 10 MG PO TABS: 10.0000 mg | ORAL_TABLET | Freq: Every day | ORAL | Status: DC

## 2017-01-05 MED ORDER — PROPOFOL 1000 MG/100ML IV EMUL
INTRAVENOUS | Status: AC
  Filled 2017-01-05: qty 100

## 2017-01-05 MED ORDER — INSULIN GLARGINE 100 UNIT/ML ~~LOC~~ SOLN
10.0000 [IU] | Freq: Every day | SUBCUTANEOUS | Status: DC
  Administered 2017-01-05 – 2017-01-10 (×6): 10 [IU] via SUBCUTANEOUS
  Filled 2017-01-05 (×6): qty 0.1

## 2017-01-05 NOTE — ED Notes (Signed)
Patient transported to CT 

## 2017-01-05 NOTE — Progress Notes (Signed)
Patient back from CT scan.

## 2017-01-05 NOTE — Progress Notes (Signed)
Inpatient Diabetes Program Recommendations  AACE/ADA: New Consensus Statement on Inpatient Glycemic Control (2015)  Target Ranges:  Prepandial:   less than 140 mg/dL      Peak postprandial:   less than 180 mg/dL (1-2 hours)      Critically ill patients:  140 - 180 mg/dL   Lab Results  Component Value Date   GLUCAP 103 (H) 01/05/2017   HGBA1C 11.7 (H) 02/08/2016    Review of Glycemic Control  Diabetes history: DM  2 Outpatient Diabetes medications: Lantus 17 units QHS, Humalog 3-10 units tid with meals Current orders for Inpatient glycemic control: IV Insulin gtt  Inpatient Diabetes Program Recommendations:    At time of transition for patient from IV Insulin, please consider Lantus 6 units and Novolog 1-3 units Q4 hours due to renal function.  Thanks,  Christena Deem RN, MSN, [facility] Inpatient Diabetes Coordinator Team Pager [phone] (8a-5p)

## 2017-01-05 NOTE — ED Notes (Signed)
Pollina, MD retracted ET tube to 24cm at the lip.

## 2017-01-05 NOTE — Progress Notes (Signed)
Insulin drip stopped prior to administration of lantus based on the glucostabilizer. Drip has been off since the CBG check at 0900.  Pharmacy was informed and asked for direction. RN was told to leave insulin drip off at this time.

## 2017-01-05 NOTE — Progress Notes (Addendum)
Critical K+ called from the lab.  K+ is 2.6  Have paged Dr. Craige Cotta,   Patient receiving dialysis and potassium is being replaced with treatment per HD RN.

## 2017-01-05 NOTE — Consult Note (Addendum)
Neurology Consultation Reason for Consult: Status epilepticus Referring Physician: Dr. Jaci Carrel  CC: Multiple seizures without return to baseline   History is obtained from chart review and family  HPI: Madeline [name] is a 61 y.o. female with multiple medical comorbidities including uncontrolled diabeties mellitus, and stage renal disease on dialysis, COPD, coronary artery disease, chronic heart failure, severely uncontrolled hypertension with previous admissions for hypertensive emergency presents to the ED after having multiple generalized tonic-clonic seizures without return to baseline. She also has history of brief seizure in the setting of uncontrolled hyperglycemia and high blood pressure in a recent admission which she left AMA.    She was found at home by her son lying in bed sleeping. He tried waking her up however the patient did not respond and shortly after he noticed her fall off the bed and was seen having generalized shaking and foaming at the mouth. When EMS arrived patient was actively seizing and they administered 2 mg of Versed. Continued to have multiple seizures en route and had not returned to baseline. Patient had additional seizures in [facility] witnessed by Dr Blinda Leatherwood who described them to be generalized tonic clonic.  Course in ER Patient was intubated in the ER after receiving succinyl choline. She received an additional 5 mg of Versed and was started on propofol infusion. 1 g of Keppra IV was administered. She was also started on Cardene drip as initial blood pressure was 260/ 120 systolic and continue to persist despite being intubated and started on propofol. Her glucose was also elevated at 772.  I asessed the patient shortly after intubation and the patient was localizing noxious stimulus. She did not have any nystagmus/gaze deviation/rhythmic eye that would be concerning for nonconvulsive status epilepticus.  A stat CT head was performed and showed no evidence of  acute abnormality such as PRES/hemorrhage.     ROS:  Unable to obtain due to altered mental status.   Past Medical History:  Diagnosis Date  . Abnormal Doppler ultrasound of carotid artery    a. Per [facility] records: <50% LICA.  Marland Kitchen Anasarca    a. Per [facility] records - due to pulm HTN with R HF.   Marland Kitchen Aseptic meningitis    a. 09/2012: adm in [place] for metabolic encephalopathy, oliguric tubular necrosis, anemia, HTN, possible CVA, HHNKA.  Marland Kitchen CHF (congestive heart failure) (HCC)    a. HFpEF with RHF/anasarca/pHTN.  . CKD (chronic kidney disease), stage III    a. Per [place] records: h/o ARF after CTA that ruled out PE.  Marland Kitchen COPD (chronic obstructive pulmonary disease) (HCC)   . Coronary artery disease    a. Per [facility] records: NSTEMI 01/2012, tx medically given ARF but suspected CAD. b. Stress test 12/16/11 reported w/o ischemia.  Marland Kitchen CVA (cerebral infarction)    a. Per [place] records, "possible CVA" 09/2012 but MRI reportedly negative.  . Diabetes mellitus (HCC)   . Dialysis patient [facility])    Mon, Wed, Fridays  . Hypertension   . OSA (obstructive sleep apnea)    a. Pt reported used to use CPAP in , but "ran out" when came to [facility].  Marland Kitchen Pulmonary hypertension (HCC)    a. [facility] 02/28/13: mod pulm HTN with normal PVR suggestive of predominantly pulmonary venous HTN.  Marland Kitchen Renal insufficiency    dialysis 3 days/week  . Right heart failure (HCC)   . Stroke [facility]) 2014    Family History  Problem Relation Age of Onset  . Diabetes Mellitus II Sister   .  Diabetes Mellitus II Brother   . CAD Brother     Social History:  reports that she has quit smoking. Her smokeless tobacco use includes Snuff. She reports that she does not drink alcohol or use drugs.   Exam: Current vital signs: BP (!) 253/103   Pulse 95   Temp 98.6 F (37 C) (Axillary)   Resp (!) 26   Ht 5' (1.524 m)   SpO2 100%  Vital signs in last 24 hours: Temp:  [98.6 F (37 C)] 98.6 F (37 C) (08/28 0005) Pulse Rate:  [93-117] 95 (08/28 0145) Resp:  [13-35]  26 (08/28 0145) BP: (248-269)/(103-122) 253/103 (08/28 0145) SpO2:  [99 %-100 %] 100 % (08/28 0145) FiO2 (%):  [50 %-100 %] 50 % (08/28 0100)   Physical Exam  Constitutional: Appears well-developed and well-nourished.  Psych: Affect appropriate to situation Eyes: No scleral injection HENT: No OP obstrucion. Intubated Head: Normocephalic.  Cardiovascular: Normal rate and regular rhythm.  Respiratory: Effort normal and breath sounds normal to anterior ascultation GI: Soft.  No distension. There is no tenderness.  Skin: WDI  Neuro: Mental Status: Patient is intubated and on sedation with propofol. Grimaces to sternal rub and localizes with both upper extremities. Cranial Nerves: Pupils equally reactive to light, no gaze deviation or nystagmus noted. Face was symmetric. Gag reflex present.  Motor: Tone is normal. Bulk is normal. Localizes both UE to pain, withdraws in both LE Sensory: Withdraws bilaterally to painful stimuli  Deep Tendon Reflexes: 2+ and symmetric in the biceps and patellae. Plantars: Toes are downgoing bilaterally. Cerebellar: Unable to test   CMP     Component Value Date/Time   NA 130 (L) 01/05/2017 0031   K 3.5 01/05/2017 0031   CL 92 (L) 01/05/2017 0031   CO2 25 01/05/2017 0019   GLUCOSE >700 (HH) 01/05/2017 0031   BUN 29 (H) 01/05/2017 0031   CREATININE 5.10 (H) 01/05/2017 0031   CREATININE 3.12 (H) 02/04/2016 1340   CALCIUM 9.2 01/05/2017 0019   CALCIUM 8.6 12/12/2013 0920   PROT 8.1 01/05/2017 0019   ALBUMIN 3.9 01/05/2017 0019   AST 23 01/05/2017 0019   ALT 12 (L) 01/05/2017 0019   ALT 18 02/04/2016 1340   ALKPHOS 158 (H) 01/05/2017 0019   BILITOT 0.6 01/05/2017 0019   GFRNONAA 8 (L) 01/05/2017 0019   GFRAA 9 (L) 01/05/2017 0019   CBC    Component Value Date/Time   WBC 8.7 01/05/2017 0019   RBC 5.26 (H) 01/05/2017 0019   HGB 18.7 (H) 01/05/2017 0031   HCT 55.0 (H) 01/05/2017 0031   HCT 33.1 (L) 10/24/2014 1019   PLT 154 01/05/2017  0019   MCV 92.0 01/05/2017 0019   MCH 28.9 01/05/2017 0019   MCHC 31.4 01/05/2017 0019   RDW 15.6 (H) 01/05/2017 0019   LYMPHSABS 1.5 01/05/2017 0019   MONOABS 0.8 01/05/2017 0019   EOSABS 0.0 01/05/2017 0019   BASOSABS 0.0 01/05/2017 0019     CT-scan of the brain  Reviewed image,    ASSESSMENT AND PLAN Madeline [name] is a 61 y.o. female with multiple medical comorbidities including uncontrolled diabeties mellitus, and stage renal disease on dialysis, COPD, coronary artery disease, chronic heart failure, severely uncontrolled hypertension who presents with multiple generalized tonic-clonic seizures without return to baseline in the setting of severe hyperglycemia and hypertensive crisis.   Status epilepticus Hyper osmolar hyperglycemic state Hypertensive emergency  Recommendations  Status epilepticus: Patient is no longer greater having generalized tonic-clonic seizures, is  localizing well. Continue Keppra 500 mg twice a day, continue propofol We'll obtain EEG to rule out subclinical seizures with patient does not become more responsive  Continue treatment of severe hyperglycemia and other metabolic conditions including underlying infection.  Hypertensive emergency CT head shows no evidence of hemorrhage Continue with nicardipine drip and please titrate blood pressure to goal of less than 180 systolic, currently patient is a 260 systolic. May consider performing MRI brain to rule out PRES   I have spent time taking care in the assessment and management of this neurologically critical ill patient as well as spent time counseling the family regarding diagnosis.

## 2017-01-05 NOTE — ED Notes (Signed)
Pt actively seizing. MD aware. No new orders at this time.

## 2017-01-05 NOTE — Consult Note (Signed)
[facility] Renal Consultation Note  Indication for Consultation:  Management of ESRD/hemodialysis; anemia, hypertension/volume and secondary hyperparathyroidism  HPI: Madeline [name] is a 61 y.o. female. ESRD 2/2 HTN/DM/hx cardiorenal HD initiated 01/01/2014 ( op  HD TTS , with compliance problems ,did attend last 3  But missed off early last 2 txs, and severly HTn last 01/02/17= 254/ 112 pre and post tx 181/100 leaving 0.8 kg below edw) admitted status epilepticus likely due to the combination of profound hyperglycemia and hypertension.   History  Per ER of found by family on floor and unresponsive. Per  EMS arrival was  hyperglycemic, hypertensive, and suffered a seizure. Seizures were refractory to multiple doses of versed and she did not return to baseline.IN ER  was intubated for airway protection, started on a propofol infusion, and loaded with Keppra.  Initial bp= 269/112,   BS 662, K 3.1 Bun 26 Scr 5.23  Ammonia 31  Hgb 12.5 WBC 12.0  CT Hd Chronic hypertensive microangiopathy without acute intracranial abnormality. CXR= Right  lung clear /Left Patchy atelectasis.    Noted recent admit  12/24/16  related to missing dialysis, seizure, and HHNK.   thought secondary to hyperglycemia. She left AMA from that admission. Currently  On vent, cardene, insulin gtt.        Past Medical History:  Diagnosis Date  . Abnormal Doppler ultrasound of carotid artery    a. Per [place] records: <36% LICA.  Marland Kitchen Anasarca    a. Per [place] records - due to pulm HTN with R HF.   Marland Kitchen Aseptic meningitis    a. 09/2012: adm in [place] for metabolic encephalopathy, oliguric tubular necrosis, anemia, HTN, possible CVA, HHNKA.  Marland Kitchen CHF (congestive heart failure) ([place])    a. HFpEF with RHF/anasarca/pHTN.  . CKD (chronic kidney disease), stage III    a. Per [place] records: h/o ARF after CTA that ruled out PE.  Marland Kitchen COPD (chronic obstructive pulmonary disease) ([place])   . Coronary artery disease    a. Per [place] records: NSTEMI 01/2012, tx  medically given ARF but suspected CAD. b. Stress test 12/16/11 reported w/o ischemia.  Marland Kitchen CVA (cerebral infarction)    a. Per [place] records, "possible CVA" 09/2012 but MRI reportedly negative.  . Diabetes mellitus ([place])   . Dialysis patient [facility])    Mon, Wed, Fridays  . Hypertension   . OSA (obstructive sleep apnea)    a. Pt reported used to use CPAP in [place], but "ran out" when came to [facility].  Marland Kitchen Pulmonary hypertension ([place])    a. [place] 02/28/13: mod pulm HTN with normal PVR suggestive of predominantly pulmonary venous HTN.  Marland Kitchen Renal insufficiency    dialysis 3 days/week  . Right heart failure ([place])   . Stroke [facility]) 2014    Past Surgical History:  Procedure Laterality Date  . ABDOMINAL HYSTERECTOMY    . AV FISTULA PLACEMENT Left 03/03/2013   Procedure: ARTERIOVENOUS (AV) FISTULA CREATION, Brachial/Cephalic;  Surgeon: Rosetta Posner, MD;  Location: [place];  Service: Vascular;  Laterality: Left;  . ESOPHAGOGASTRODUODENOSCOPY N/A 02/16/2013   Procedure: ESOPHAGOGASTRODUODENOSCOPY (EGD);  Surgeon: Wonda Horner, MD;  Location: [facility] ENDOSCOPY;  Service: Endoscopy;  Laterality: N/A;  . INSERTION OF DIALYSIS CATHETER Right 03/03/2013   Procedure: INSERTION OF DIALYSIS CATHETER;  Surgeon: Rosetta Posner, MD;  Location: [place];  Service: Vascular;  Laterality: Right;  Right Internal Jugular Placement  . RIGHT HEART CATHETERIZATION N/A 02/28/2013   Procedure: RIGHT HEART CATH;  Surgeon: Quillian Quince  R Bensimhon, MD;  Location: [place] CATH LAB;  Service: Cardiovascular;  Laterality: N/A;  . SHUNTOGRAM N/A 07/13/2013   Procedure: FISTULOGRAM;  Surgeon: Conrad , MD;  Location: [facility] CATH LAB;  Service: Cardiovascular;  Laterality: N/A;  . TUBAL LIGATION        Family History  Problem Relation Age of Onset  . Diabetes Mellitus II Sister   . Diabetes Mellitus II Brother   . CAD Brother       reports that she has quit smoking. Her smokeless tobacco use includes Snuff. She reports that she does not drink alcohol or use  drugs.  No Known Allergies  Prior to Admission medications   Medication Sig Start Date End Date Taking? Authorizing Provider  acetaminophen (TYLENOL) 500 MG tablet Take 500-1,000 mg by mouth every 6 (six) hours as needed (for pain).     [provider]  albuterol (PROVENTIL HFA;VENTOLIN HFA) 108 (90 BASE) MCG/ACT inhaler Inhale 2 puffs into the lungs every 6 (six) hours as needed for wheezing or shortness of breath.     [provider]  amLODipine (NORVASC) 10 MG tablet Take 10 mg by mouth daily.     [provider]  aspirin EC 81 MG tablet Take 81 mg by mouth daily.    [provider]  carvedilol (COREG) 25 MG tablet Take 25 mg by mouth 2 (two) times daily with a meal.    [provider]  cinacalcet (SENSIPAR) 30 MG tablet Take 2 tablets (60 mg total) by mouth daily with supper. 11/03/16   Theodis Blaze, MD  insulin glargine (LANTUS) 100 UNIT/ML injection Inject 0.17 mLs (17 Units total) into the skin at bedtime. Patient not taking: Reported on 12/24/2016 02/12/16   Caren Griffins, MD  insulin lispro (HUMALOG) 100 UNIT/ML injection Inject 3-10 Units into the skin 3 (three) times daily before meals. Per sliding scale: "BGL 200 = 3 units; 250 = 5 units; 300 = 7 units; 350 = 9-10 units"    [provider]  metoCLOPramide (REGLAN) 5 MG tablet Take 1 tablet (5 mg total) by mouth 4 (four) times daily -  before meals and at bedtime. 10/26/14   Geradine Girt, DO  mirtazapine (REMERON) 15 MG tablet Take 15 mg by mouth at bedtime.    [provider]  multivitamin (RENA-VIT) TABS tablet Take 1 tablet by mouth at bedtime. Patient taking differently: Take 1 tablet by mouth daily.  11/03/16   Theodis Blaze, MD  nitroGLYCERIN (NITROSTAT) 0.4 MG SL tablet PLACE ONE TABLET UNDER THE TONGUE EVERY FIVE MINUTES AS NEEDED FOR CHEST PAIN 04/14/16   Bensimhon, Shaune Pascal, MD  pantoprazole (PROTONIX) 40 MG tablet Take 1 tablet (40 mg total) by mouth 2 (two)  times daily. 06/05/13   Dixon Boos, MD  polyethylene glycol (MIRALAX / GLYCOLAX) packet Take 17 g by mouth daily. Patient taking differently: Take 17 g by mouth daily as needed for mild constipation.  10/16/14   Domenic Polite, MD  promethazine (PHENERGAN) 25 MG tablet Take 25 mg by mouth every 4 (four) hours as needed for nausea or vomiting.     [provider]  ranitidine (ZANTAC) 150 MG tablet Take 150 mg by mouth daily as needed for heartburn.    [provider]  rosuvastatin (CRESTOR) 10 MG tablet Take 1 tablet (10 mg total) by mouth daily. 03/16/16   ComerOkey Regal, MD  traMADol (ULTRAM) 50 MG tablet Take 50 mg by mouth  2 (two) times daily as needed (for pain).     [provider]     Anti-infectives    None      Results for orders placed or performed during the hospital encounter of 01/04/17 (from the past 48 hour(s))  CBG monitoring, ED     Status: Abnormal   Collection Time: 01/05/17 12:07 AM  Result Value Ref Range   Glucose-Capillary >600 (HH) 65 - 99 mg/dL   Comment 1 Notify RN    Comment 2 Document in Chart   CBC with Differential/Platelet     Status: Abnormal   Collection Time: 01/05/17 12:19 AM  Result Value Ref Range   WBC 8.7 4.0 - 10.5 K/uL   RBC 5.26 (H) 3.87 - 5.11 MIL/uL   Hemoglobin 15.2 (H) 12.0 - 15.0 g/dL   HCT 48.4 (H) 36.0 - 46.0 %   MCV 92.0 78.0 - 100.0 fL   MCH 28.9 26.0 - 34.0 pg   MCHC 31.4 30.0 - 36.0 g/dL   RDW 15.6 (H) 11.5 - 15.5 %   Platelets 154 150 - 400 K/uL   Neutrophils Relative % 73 %   Neutro Abs 6.4 1.7 - 7.7 K/uL   Lymphocytes Relative 18 %   Lymphs Abs 1.5 0.7 - 4.0 K/uL   Monocytes Relative 9 %   Monocytes Absolute 0.8 0.1 - 1.0 K/uL   Eosinophils Relative 0 %   Eosinophils Absolute 0.0 0.0 - 0.7 K/uL   Basophils Relative 0 %   Basophils Absolute 0.0 0.0 - 0.1 K/uL  Comprehensive metabolic panel     Status: Abnormal   Collection Time: 01/05/17 12:19 AM  Result Value Ref Range   Sodium 130  (L) 135 - 145 mmol/L   Potassium 3.5 3.5 - 5.1 mmol/L   Chloride 88 (L) 101 - 111 mmol/L   CO2 25 22 - 32 mmol/L   Glucose, Bld 772 (HH) 65 - 99 mg/dL    Comment: CRITICAL RESULT CALLED TO, READ BACK BY AND VERIFIED WITH: CHERVENKA,K RN 01/05/2017 0125 JORDANS    BUN 25 (H) 6 - 20 mg/dL   Creatinine, Ser 5.40 (H) 0.44 - 1.00 mg/dL   Calcium 9.2 8.9 - 10.3 mg/dL   Total Protein 8.1 6.5 - 8.1 g/dL   Albumin 3.9 3.5 - 5.0 g/dL   AST 23 15 - 41 U/L   ALT 12 (L) 14 - 54 U/L   Alkaline Phosphatase 158 (H) 38 - 126 U/L   Total Bilirubin 0.6 0.3 - 1.2 mg/dL   GFR calc non Af Amer 8 (L) >60 mL/min   GFR calc Af Amer 9 (L) >60 mL/min    Comment: (NOTE) The eGFR has been calculated using the CKD EPI equation. This calculation has not been validated in all clinical situations. eGFR's persistently <60 mL/min signify possible Chronic Kidney Disease.    Anion gap 17 (H) 5 - 15  I-stat chem 8, ed     Status: Abnormal   Collection Time: 01/05/17 12:31 AM  Result Value Ref Range   Sodium 130 (L) 135 - 145 mmol/L   Potassium 3.5 3.5 - 5.1 mmol/L   Chloride 92 (L) 101 - 111 mmol/L   BUN 29 (H) 6 - 20 mg/dL   Creatinine, Ser 5.10 (H) 0.44 - 1.00 mg/dL   Glucose, Bld >700 (HH) 65 - 99 mg/dL   Calcium, Ion 1.04 (L) 1.15 - 1.40 mmol/L   TCO2 25 22 - 32 mmol/L   Hemoglobin 18.7 (H) 12.0 -  15.0 g/dL   HCT 55.0 (H) 36.0 - 46.0 %   Comment NOTIFIED PHYSICIAN   I-stat troponin, ED     Status: None   Collection Time: 01/05/17 12:31 AM  Result Value Ref Range   Troponin i, poc 0.00 0.00 - 0.08 ng/mL   Comment 3            Comment: Due to the release kinetics of cTnI, a negative result within the first hours of the onset of symptoms does not rule out myocardial infarction with certainty. If myocardial infarction is still suspected, repeat the test at appropriate intervals.   I-Stat CG4 Lactic Acid, ED     Status: Abnormal   Collection Time: 01/05/17 12:32 AM  Result Value Ref Range   Lactic  Acid, Venous 2.13 (HH) 0.5 - 1.9 mmol/L   Comment NOTIFIED PHYSICIAN   I-Stat arterial blood gas, ED     Status: Abnormal   Collection Time: 01/05/17 12:57 AM  Result Value Ref Range   pH, Arterial 7.300 (L) 7.350 - 7.450   pCO2 arterial 68.9 (HH) 32.0 - 48.0 mmHg   pO2, Arterial 456.0 (H) 83.0 - 108.0 mmHg   Bicarbonate 33.9 (H) 20.0 - 28.0 mmol/L   TCO2 36 (H) 22 - 32 mmol/L   O2 Saturation 100.0 %   Acid-Base Excess 4.0 (H) 0.0 - 2.0 mmol/L   Patient temperature 98.6 F    Collection site RADIAL, ALLEN'S TEST ACCEPTABLE    Drawn by Operator    Sample type ARTERIAL    Comment NOTIFIED PHYSICIAN   CBG monitoring, ED     Status: Abnormal   Collection Time: 01/05/17  2:41 AM  Result Value Ref Range   Glucose-Capillary >600 (HH) 65 - 99 mg/dL  Ethanol     Status: None   Collection Time: 01/05/17  3:19 AM  Result Value Ref Range   Alcohol, Ethyl (B) <5 <5 mg/dL    Comment:        LOWEST DETECTABLE LIMIT FOR SERUM ALCOHOL IS 5 mg/dL FOR MEDICAL PURPOSES ONLY   Beta-hydroxybutyric acid     Status: Abnormal   Collection Time: 01/05/17  3:19 AM  Result Value Ref Range   Beta-Hydroxybutyric Acid 0.91 (H) 0.05 - 0.27 mmol/L  Ammonia     Status: None   Collection Time: 01/05/17  3:19 AM  Result Value Ref Range   Ammonia 31 9 - 35 umol/L  Salicylate level     Status: None   Collection Time: 01/05/17  3:19 AM  Result Value Ref Range   Salicylate Lvl <0.2 2.8 - 30.0 mg/dL  Acetaminophen level     Status: Abnormal   Collection Time: 01/05/17  3:19 AM  Result Value Ref Range   Acetaminophen (Tylenol), Serum <10 (L) 10 - 30 ug/mL    Comment:        THERAPEUTIC CONCENTRATIONS VARY SIGNIFICANTLY. A RANGE OF 10-30 ug/mL MAY BE AN EFFECTIVE CONCENTRATION FOR MANY PATIENTS. HOWEVER, SOME ARE BEST TREATED AT CONCENTRATIONS OUTSIDE THIS RANGE. ACETAMINOPHEN CONCENTRATIONS >150 ug/mL AT 4 HOURS AFTER INGESTION AND >50 ug/mL AT 12 HOURS AFTER INGESTION ARE OFTEN ASSOCIATED WITH  TOXIC REACTIONS.   Lactic acid, plasma     Status: Abnormal   Collection Time: 01/05/17  3:19 AM  Result Value Ref Range   Lactic Acid, Venous 2.3 (HH) 0.5 - 1.9 mmol/L    Comment: CRITICAL RESULT CALLED TO, READ BACK BY AND VERIFIED WITH: Elias Else [phone] WILDERK   CBG monitoring, ED  Status: Abnormal   Collection Time: 01/05/17  3:56 AM  Result Value Ref Range   Glucose-Capillary >600 (HH) 65 - 99 mg/dL  Magnesium     Status: None   Collection Time: 01/05/17  3:58 AM  Result Value Ref Range   Magnesium 2.1 1.7 - 2.4 mg/dL  Phosphorus     Status: Abnormal   Collection Time: 01/05/17  3:58 AM  Result Value Ref Range   Phosphorus 5.2 (H) 2.5 - 4.6 mg/dL  Troponin I     Status: Abnormal   Collection Time: 01/05/17  3:58 AM  Result Value Ref Range   Troponin I 0.11 (HH) <0.03 ng/mL    Comment: CRITICAL RESULT CALLED TO, READ BACK BY AND VERIFIED WITH: Aggie Hacker [phone] [facility]   Basic metabolic panel     Status: Abnormal   Collection Time: 01/05/17  3:58 AM  Result Value Ref Range   Sodium 130 (L) 135 - 145 mmol/L   Potassium 3.1 (L) 3.5 - 5.1 mmol/L   Chloride 93 (L) 101 - 111 mmol/L   CO2 23 22 - 32 mmol/L   Glucose, Bld 662 (HH) 65 - 99 mg/dL    Comment: CRITICAL RESULT CALLED TO, READ BACK BY AND VERIFIED WITH: Aggie Hacker [phone] WILDERK    BUN 26 (H) 6 - 20 mg/dL   Creatinine, Ser 5.23 (H) 0.44 - 1.00 mg/dL   Calcium 8.1 (L) 8.9 - 10.3 mg/dL   GFR calc non Af Amer 8 (L) >60 mL/min   GFR calc Af Amer 9 (L) >60 mL/min    Comment: (NOTE) The eGFR has been calculated using the CKD EPI equation. This calculation has not been validated in all clinical situations. eGFR's persistently <60 mL/min signify possible Chronic Kidney Disease.    Anion gap 14 5 - 15  I-Stat arterial blood gas, ED     Status: Abnormal   Collection Time: 01/05/17  5:14 AM  Result Value Ref Range   pH, Arterial 7.452 (H) 7.350 - 7.450   pCO2 arterial 37.7 32.0 -  48.0 mmHg   pO2, Arterial 170.0 (H) 83.0 - 108.0 mmHg   Bicarbonate 26.3 20.0 - 28.0 mmol/L   TCO2 27 22 - 32 mmol/L   O2 Saturation 100.0 %   Acid-Base Excess 2.0 0.0 - 2.0 mmol/L   Patient temperature 98.6 F    Collection site RADIAL, ALLEN'S TEST ACCEPTABLE    Drawn by RT    Sample type ARTERIAL   CBG monitoring, ED     Status: Abnormal   Collection Time: 01/05/17  5:15 AM  Result Value Ref Range   Glucose-Capillary 490 (H) 65 - 99 mg/dL  CBC     Status: Abnormal   Collection Time: 01/05/17  5:43 AM  Result Value Ref Range   WBC 12.0 (H) 4.0 - 10.5 K/uL   RBC 4.48 3.87 - 5.11 MIL/uL   Hemoglobin 12.5 12.0 - 15.0 g/dL    Comment: REPEATED TO VERIFY DELTA CHECK NOTED RESULTS VERIFIED VIA RECOLLECT    HCT 39.9 36.0 - 46.0 %   MCV 89.1 78.0 - 100.0 fL   MCH 27.9 26.0 - 34.0 pg   MCHC 31.3 30.0 - 36.0 g/dL   RDW 15.4 11.5 - 15.5 %   Platelets 164 150 - 400 K/uL  CBG monitoring, ED     Status: Abnormal   Collection Time: 01/05/17  6:11 AM  Result Value Ref Range   Glucose-Capillary 357 (H) 65 - 99 mg/dL  Glucose, capillary     Status: Abnormal   Collection Time: 01/05/17  7:11 AM  Result Value Ref Range   Glucose-Capillary 166 (H) 65 - 99 mg/dL   Comment 1 Notify RN    Comment 2 Document in Chart   Glucose, capillary     Status: Abnormal   Collection Time: 01/05/17  8:15 AM  Result Value Ref Range   Glucose-Capillary 116 (H) 65 - 99 mg/dL    ROS: not available 2/2 pt condition    Physical Exam: Vitals:   01/05/17 0719 01/05/17 0800  BP: (!) 124/54 (!) 120/55  Pulse: 69 70  Resp: (!) 23 19  Temp:    SpO2: 100% 100%     General: On vent / sedated HEENT: [place] ETT in place Heart: RRR  No mur , rub , gallop Lungs: On vent  No rales or wheezes  Abdomen: BS pos  Soft, ND, NT Extremities: No pedal edema Skin:  No overt rash or pedal ulcers  Neuro: sedated on vent . No response to voice request Dialysis Access: Pos bruit  LUA AVF   Dialysis Orders: Center: [facility]    on , TueThuSat,  EDW 58 (kg) 3.0 K, 2.25 Ca,  Profile 4,  AV Fistula-LUA  (Hectorol) 8 mcg  Heparin SodiumBolus 4000 units  Mircera 225 mcg IVP  q 2wks last on 12/31/16               Assessment/Pla 1. Acute Hypercarbic REsp  Failure on Vent - CCM  managing  2. Hypertensive Urgency ( ho recurrence )- Nicardipine infusion, Propofol . No excess vol. 3. ESRD -  HD  TTS  No urgent need, currently  Last HD on schedule  Sat  /Currently EEG testing  4. Status epilepticus - EEG testing in process /?likely in setting of hyperglycemia/ HTN  5. Anemia  -hgb >11 no esa needed , fu trend 6. Metabolic bone disease -  Iv hectorol . Binder when pos  7. DM  Uncontrolled on Insulin drip - recurrent problem  Ernest Haber, PA-C [place] [phone] 01/05/2017, 8:59 AM  \ Renal Attending Note:  I agree with note articulated above.  Will plan for HD today.  Will need to give premed to help facilitate her attendance at HD  [facility]

## 2017-01-05 NOTE — ED Notes (Signed)
X-ray at bedside

## 2017-01-05 NOTE — ED Notes (Signed)
Pt arrived by EMS from home. Pt had an unwitnessed fall out of bed. Pt had 3 seizures en route to hospital. EMS gave 7.5mg  Versed. Hx of seizures. Dialysis pt TThSa. EMS VS CBG High BP 240/110, 99% non-rebreather.

## 2017-01-05 NOTE — Care Management Note (Signed)
Case Management Note  Patient Details  Name: Madeline [name] MRN: [id number] Date of Birth: 1956/01/14  Subjective/Objective:  61 year old female with ESRD, seizures admitted 8/28 with status epilepticus likely due to the combination of profound hyperglycemia and hypertension.  PTA, pt resided at home with daughter.  She is on dialysis Tues/Thurs/Sat.                Action/Plan: Pt remains intubated on full vent support.  Will continue to follow as pt progresses.    Expected Discharge Date:                  Expected Discharge Plan:     In-House Referral:     Discharge planning Services  CM Consult  Post Acute Care Choice:    Choice offered to:     DME Arranged:    DME Agency:     HH Arranged:    HH Agency:     Status of Service:  In process, will continue to follow  If discussed at Long Length of Stay Meetings, dates discussed:    Additional Comments:  Quintella Baton, RN, BSN  Trauma/Neuro ICU Case Manager [phone]

## 2017-01-05 NOTE — Progress Notes (Signed)
vLTM EEG running. Notified neuro 

## 2017-01-05 NOTE — ED Notes (Signed)
Pt's daughter updated on bed assignment.

## 2017-01-05 NOTE — Progress Notes (Signed)
PULMONARY / CRITICAL CARE MEDICINE   Name: Madeline [name] MRN: [id number] DOB: [birth year]    ADMISSION DATE:  01/04/2017 CONSULTATION DATE:  8/28  REFERRING MD:  Dr. Blinda Leatherwood  CHIEF COMPLAINT:  seizure  HISTORY OF PRESENT ILLNESS:  61 yo female former smoker with status epilepticus, HONK, compromised airway. Hx of ESRD, COPD, DM, HTN, CAD.  Reportedly non compliant with therapy.  SUBJECTIVE:  Remains on vent, cardene, insulin gtt  VITAL SIGNS: BP (!) 145/60   Pulse 74   Temp 99.1 F (37.3 C) (Axillary)   Resp (!) 24   Ht 5' (1.524 m)   Wt 133 lb 2.5 oz (60.4 kg)   SpO2 100%   BMI 26.01 kg/m   VENTILATOR SETTINGS: Vent Mode: PRVC FiO2 (%):  [40 %-100 %] 40 % Set Rate:  [18 bmp-24 bmp] 24 bmp Vt Set:  [360 mL] 360 mL PEEP:  [5 cmH20] 5 cmH20 Plateau Pressure:  [18 cmH20] 18 cmH20  INTAKE / OUTPUT: I/O last 3 completed shifts: In: 1486.5 [I.V.:376.5; IV Piggyback:1110] Out: -   PHYSICAL EXAMINATION:  General - sedated Eyes - pupils pinpoint ENT - ETT in place Cardiac - regular, no murmur Chest - no wheeze, rales Abd - soft, non tender Ext - no edema Skin - no rashes Neuro - RASS -3   LABS:  BMET  Recent Labs Lab 01/05/17 0019 01/05/17 0031 01/05/17 0358  NA 130* 130* 130*  K 3.5 3.5 3.1*  CL 88* 92* 93*  CO2 25  --  23  BUN 25* 29* 26*  CREATININE 5.40* 5.10* 5.23*  GLUCOSE 772* >700* 662*    Electrolytes  Recent Labs Lab 01/05/17 0019 01/05/17 0358  CALCIUM 9.2 8.1*  MG  --  2.1  PHOS  --  5.2*    CBC  Recent Labs Lab 01/05/17 0019 01/05/17 0031 01/05/17 0543  WBC 8.7  --  12.0*  HGB 15.2* 18.7* 12.5  HCT 48.4* 55.0* 39.9  PLT 154  --  164    Coag's No results for input(s): APTT, INR in the last 168 hours.  Sepsis Markers  Recent Labs Lab 01/05/17 0032 01/05/17 0319  LATICACIDVEN 2.13* 2.3*    ABG  Recent Labs Lab 01/05/17 0057 01/05/17 0514  PHART 7.300* 7.452*  PCO2ART 68.9* 37.7  PO2ART 456.0* 170.0*     Liver Enzymes  Recent Labs Lab 01/05/17 0019  AST 23  ALT 12*  ALKPHOS 158*  BILITOT 0.6  ALBUMIN 3.9    Cardiac Enzymes  Recent Labs Lab 01/05/17 0358  TROPONINI 0.11*    Glucose  Recent Labs Lab 01/05/17 0007 01/05/17 0241 01/05/17 0356 01/05/17 0515 01/05/17 0611 01/05/17 0711  GLUCAP >600* >600* >600* 490* 357* 166*    Imaging Ct Head Wo Contrast  Result Date: 01/05/2017 CLINICAL DATA:  Found down and unresponsive. EXAM: CT HEAD WITHOUT CONTRAST CT CERVICAL SPINE WITHOUT CONTRAST TECHNIQUE: Multidetector CT imaging of the head and cervical spine was performed following the standard protocol without intravenous contrast. Multiplanar CT image reconstructions of the cervical spine were also generated. COMPARISON:  Head CT 12/24/2016 FINDINGS: CT HEAD FINDINGS Brain: No mass lesion, intraparenchymal hemorrhage or extra-axial collection. No evidence of acute cortical infarct. There is periventricular hypoattenuation compatible with chronic microvascular disease. Vascular: Atherosclerotic calcification of the vertebral and internal carotid arteries at the skull base. Skull: Normal visualized skull base, calvarium and extracranial soft tissues. Sinuses/Orbits: No sinus fluid levels or advanced mucosal thickening. No mastoid effusion. Normal orbits. CT CERVICAL SPINE FINDINGS  Alignment: No static subluxation. Facets are aligned. Occipital condyles are normally positioned. Skull base and vertebrae: No acute fracture. Soft tissues and spinal canal: No prevertebral fluid or swelling. No visible canal hematoma. Disc levels:  Disc space narrowing and endplate remodeling at C6-C7. Upper chest: Incompletely evaluated left upper lobe opacity, likely scarring. Other: Normal visualized paraspinal cervical soft tissues. IMPRESSION: 1. Chronic hypertensive microangiopathy without acute intracranial abnormality. 2. No acute fracture or static subluxation of the cervical spine. Electronically  Signed   By: Deatra Robinson M.D.   On: 01/05/2017 02:58   Ct Cervical Spine Wo Contrast  Result Date: 01/05/2017 CLINICAL DATA:  Found down and unresponsive. EXAM: CT HEAD WITHOUT CONTRAST CT CERVICAL SPINE WITHOUT CONTRAST TECHNIQUE: Multidetector CT imaging of the head and cervical spine was performed following the standard protocol without intravenous contrast. Multiplanar CT image reconstructions of the cervical spine were also generated. COMPARISON:  Head CT 12/24/2016 FINDINGS: CT HEAD FINDINGS Brain: No mass lesion, intraparenchymal hemorrhage or extra-axial collection. No evidence of acute cortical infarct. There is periventricular hypoattenuation compatible with chronic microvascular disease. Vascular: Atherosclerotic calcification of the vertebral and internal carotid arteries at the skull base. Skull: Normal visualized skull base, calvarium and extracranial soft tissues. Sinuses/Orbits: No sinus fluid levels or advanced mucosal thickening. No mastoid effusion. Normal orbits. CT CERVICAL SPINE FINDINGS Alignment: No static subluxation. Facets are aligned. Occipital condyles are normally positioned. Skull base and vertebrae: No acute fracture. Soft tissues and spinal canal: No prevertebral fluid or swelling. No visible canal hematoma. Disc levels:  Disc space narrowing and endplate remodeling at C6-C7. Upper chest: Incompletely evaluated left upper lobe opacity, likely scarring. Other: Normal visualized paraspinal cervical soft tissues. IMPRESSION: 1. Chronic hypertensive microangiopathy without acute intracranial abnormality. 2. No acute fracture or static subluxation of the cervical spine. Electronically Signed   By: Deatra Robinson M.D.   On: 01/05/2017 02:58   Dg Chest Port 1 View  Result Date: 01/05/2017 CLINICAL DATA:  Seizure, unresponsive EXAM: PORTABLE CHEST 1 VIEW COMPARISON:  12/24/2016 FINDINGS: Endotracheal tube tip is suspected to be at the carina. Esophageal tube tip is below the  diaphragm. Right lung is clear. Borderline heart size with atherosclerosis. Patchy atelectasis at the left base. No pneumothorax. IMPRESSION: 1. Endotracheal tube tip appears at the carina 2. Borderline cardiomegaly. Patchy atelectasis at the left lung base. Electronically Signed   By: Jasmine Pang M.D.   On: 01/05/2017 02:14   Dg Abd Portable 1 View  Result Date: 01/05/2017 CLINICAL DATA:  OG tube placement EXAM: PORTABLE ABDOMEN - 1 VIEW COMPARISON:  CT 10/29/2016 FINDINGS: Esophageal tube tip and side port overlie the mid stomach. Patchy atelectasis or infiltrate at the left base. Nonobstructed gas pattern. Small calcification to the left of L4. IMPRESSION: Esophageal tube tip overlies the mid stomach. Electronically Signed   By: Jasmine Pang M.D.   On: 01/05/2017 02:17    STUDIES:  CT head C-spine 8/28 > Chronic hypertensive microangiopathy without acute intracranial abnormality. No acute fracture or static subluxation of the cervical spine.  CULTURES:   ANTIBIOTICS:   SIGNIFICANT EVENTS: 8/28 Admit, neuro and renal consulted  LINES/TUBES: ETT 8/28 >  DISCUSSION: 61 year old female with ESRD, seizures admitted 8/28 with status epilepticus likely due to the combination of profound hyperglycemia and hypertension.   ASSESSMENT / PLAN:  PULMONARY A: Acute hypercarbic respiratory failure COPD without acute exacerbation OSA  P:   Full vent support until neuro status more stable F/u CXR  CARDIOVASCULAR A:  Hypertensive urgency Chronic diastolic CHF CAD  P:  Goal SBP < 180 Resume outpt meds  RENAL A:   ESRD on HD   P:   Consult renal for HD >> outpt schedule T, Th, Sa  GASTROINTESTINAL A:   Nutrition  P:   Tube feeds if unable to extubate soon Protonix  HEMATOLOGIC A:  No acute issues  P:  Volume Follow CBC SQ heparin for VTE ppx  INFECTIOUS A:   No acute issues  P:   Monitor clinically  ENDOCRINE A:   DM with HHNK   P:   Transition off  insulin gtt as able  NEUROLOGIC A:   Status epilepticus likely secondary to metabolic derangements History of CVA  P:   RASS goal 0 to -1 AEDs per neuro F/u EEG  CC time 33 minutes  Coralyn Helling, MD [facility] Pulmonary/Critical Care 01/05/2017, 7:34 AM Pager:  [phone] After 3pm call: [phone]

## 2017-01-05 NOTE — ED Provider Notes (Signed)
[facility]-EMERGENCY DEPT Provider Note   CSN: [id number] Arrival date & time: 01/04/17  2358     History   Chief Complaint Chief Complaint  Patient presents with  . Seizures    HPI Madeline [name] is a 61 y.o. female.  Patient brought to the emergency department for evaluation of seizure. Patient brought to the ER from home by EMS. EMS report finding the patientlying on the ground next to her bed. Family thinks she might have fallen, were unclear about circumstances. EMS report critically high blood sugar, markedly elevated blood pressure and recurrent seizures. Patient has had 3 generalized tonic-clonic seizures during transport. She was given Versed 2.5 mg IV after each seizure with brief cessation of seizures, but then started seizing again. Level V Caveat due to acuity, patient non-verbal.      Past Medical History:  Diagnosis Date  . Abnormal Doppler ultrasound of carotid artery    a. Per [place] records: <50% LICA.  Marland Kitchen Anasarca    a. Per [place] records - due to pulm HTN with R HF.   Marland Kitchen Aseptic meningitis    a. 09/2012: adm in [place] for metabolic encephalopathy, oliguric tubular necrosis, anemia, HTN, possible CVA, HHNKA.  Marland Kitchen CHF (congestive heart failure) (HCC)    a. HFpEF with RHF/anasarca/pHTN.  . CKD (chronic kidney disease), stage III    a. Per [place] records: h/o ARF after CTA that ruled out PE.  Marland Kitchen COPD (chronic obstructive pulmonary disease) (HCC)   . Coronary artery disease    a. Per [place] records: NSTEMI 01/2012, tx medically given ARF but suspected CAD. b. Stress test 12/16/11 reported w/o ischemia.  Marland Kitchen CVA (cerebral infarction)    a. Per [place] records, "possible CVA" 09/2012 but MRI reportedly negative.  . Diabetes mellitus (HCC)   . Dialysis patient [facility])    Mon, Wed, Fridays  . Hypertension   . OSA (obstructive sleep apnea)    a. Pt reported used to use CPAP in [place], but "ran out" when came to [facility].  Marland Kitchen Pulmonary hypertension (HCC)    a. [facility] 02/28/13: mod pulm HTN with normal PVR suggestive of  predominantly pulmonary venous HTN.  Marland Kitchen Renal insufficiency    dialysis 3 days/week  . Right heart failure (HCC)   . Stroke [facility]) 2014    Patient Active Problem List   Diagnosis Date Noted  . Hyperosmolar non-ketotic state in patient with type 2 diabetes mellitus (HCC)   . Hypertensive emergency   . Altered mental status 10/29/2016  . Chronic hepatitis C without hepatic coma (HCC) 03/10/2016  . DKA (diabetic ketoacidosis) (HCC) 02/08/2016  . Uncontrollable vomiting   . Emesis, persistent 02/07/2016  . Abnormal abdominal ultrasound 10/24/2014  . Acute encephalopathy 10/23/2014  . DKA, type 2, not at goal [facility]) 10/23/2014  . Nausea & vomiting 10/23/2014  . Hypertension 10/23/2014  . Abdominal pain   . Encounter for central line placement   . Fever   . Anemia 10/10/2014  . Hypoglycemia 02/07/2014  . Seizure (HCC) 02/07/2014  . ESRD on hemodialysis (HCC) 02/07/2014  . Urinary tract infection, site not specified 02/07/2014  . Pain in the abdomen 01/10/2014  . Anemia of chronic renal failure 01/10/2014  . Secondary hyperparathyroidism (HCC) 01/10/2014  . Hyperglycemia 01/07/2014  . End stage renal disease (HCC) 10/13/2013  . PUD (peptic ulcer disease) 06/03/2013  . Chronic diastolic heart failure (HCC) 03/21/2013  . Acute on chronic diastolic congestive heart failure (HCC) 03/01/2013  . OSA on CPAP 02/16/2013  . HTN (  hypertension), malignant 02/14/2013  . Diabetes mellitus type II, uncontrolled (HCC) 02/14/2013  . DM (diabetes mellitus) (HCC) 04/05/2012    Past Surgical History:  Procedure Laterality Date  . ABDOMINAL HYSTERECTOMY    . AV FISTULA PLACEMENT Left 03/03/2013   Procedure: ARTERIOVENOUS (AV) FISTULA CREATION, Brachial/Cephalic;  Surgeon: Larina Earthly, MD;  Location: [facility] OR;  Service: Vascular;  Laterality: Left;  . ESOPHAGOGASTRODUODENOSCOPY N/A 02/16/2013   Procedure: ESOPHAGOGASTRODUODENOSCOPY (EGD);  Surgeon: Graylin Shiver, MD;  Location: [facility] ENDOSCOPY;  Service:  Endoscopy;  Laterality: N/A;  . INSERTION OF DIALYSIS CATHETER Right 03/03/2013   Procedure: INSERTION OF DIALYSIS CATHETER;  Surgeon: Larina Earthly, MD;  Location: [facility] OR;  Service: Vascular;  Laterality: Right;  Right Internal Jugular Placement  . RIGHT HEART CATHETERIZATION N/A 02/28/2013   Procedure: RIGHT HEART CATH;  Surgeon: Dolores Patty, MD;  Location: [facility] CATH LAB;  Service: Cardiovascular;  Laterality: N/A;  . SHUNTOGRAM N/A 07/13/2013   Procedure: FISTULOGRAM;  Surgeon: Fransisco Hertz, MD;  Location: [facility] CATH LAB;  Service: Cardiovascular;  Laterality: N/A;  . TUBAL LIGATION      OB History    No data available       Home Medications    Prior to Admission medications   Medication Sig Start Date End Date Taking? Authorizing Provider  acetaminophen (TYLENOL) 500 MG tablet Take 500-1,000 mg by mouth every 6 (six) hours as needed (for pain).     [provider]  albuterol (PROVENTIL HFA;VENTOLIN HFA) 108 (90 BASE) MCG/ACT inhaler Inhale 2 puffs into the lungs every 6 (six) hours as needed for wheezing or shortness of breath.     [provider]  amLODipine (NORVASC) 10 MG tablet Take 10 mg by mouth daily.     [provider]  aspirin EC 81 MG tablet Take 81 mg by mouth daily.    [provider]  carvedilol (COREG) 25 MG tablet Take 25 mg by mouth 2 (two) times daily with a meal.    [provider]  cinacalcet (SENSIPAR) 30 MG tablet Take 2 tablets (60 mg total) by mouth daily with supper. 11/03/16   Dorothea Ogle, MD  insulin glargine (LANTUS) 100 UNIT/ML injection Inject 0.17 mLs (17 Units total) into the skin at bedtime. Patient not taking: Reported on 12/24/2016 02/12/16   Leatha Gilding, MD  insulin lispro (HUMALOG) 100 UNIT/ML injection Inject 3-10 Units into the skin 3 (three) times daily before meals. Per sliding scale: "BGL 200 = 3 units; 250 = 5 units; 300 = 7 units; 350 = 9-10 units"    [provider]  metoCLOPramide  (REGLAN) 5 MG tablet Take 1 tablet (5 mg total) by mouth 4 (four) times daily -  before meals and at bedtime. 10/26/14   Joseph Art, DO  mirtazapine (REMERON) 15 MG tablet Take 15 mg by mouth at bedtime.    [provider]  multivitamin (RENA-VIT) TABS tablet Take 1 tablet by mouth at bedtime. Patient taking differently: Take 1 tablet by mouth daily.  11/03/16   Dorothea Ogle, MD  nitroGLYCERIN (NITROSTAT) 0.4 MG SL tablet PLACE ONE TABLET UNDER THE TONGUE EVERY FIVE MINUTES AS NEEDED FOR CHEST PAIN 04/14/16   Bensimhon, Bevelyn Buckles, MD  pantoprazole (PROTONIX) 40 MG tablet Take 1 tablet (40 mg total) by mouth 2 (two) times daily. 06/05/13   Coolidge Breeze, MD  polyethylene glycol (MIRALAX / GLYCOLAX) packet Take 17 g by mouth daily. Patient taking differently:  Take 17 g by mouth daily as needed for mild constipation.  10/16/14   Zannie Cove, MD  promethazine (PHENERGAN) 25 MG tablet Take 25 mg by mouth every 4 (four) hours as needed for nausea or vomiting.     [provider]  ranitidine (ZANTAC) 150 MG tablet Take 150 mg by mouth daily as needed for heartburn.    [provider]  rosuvastatin (CRESTOR) 10 MG tablet Take 1 tablet (10 mg total) by mouth daily. 03/16/16   Comer, Belia Heman, MD  traMADol (ULTRAM) 50 MG tablet Take 50 mg by mouth 2 (two) times daily as needed (for pain).     [provider]    Family History Family History  Problem Relation Age of Onset  . Diabetes Mellitus II Sister   . Diabetes Mellitus II Brother   . CAD Brother     Social History Social History  Substance Use Topics  . Smoking status: Former Games developer  . Smokeless tobacco: Current User    Types: Snuff  . Alcohol use No     Allergies   Patient has no known allergies.   Review of Systems Review of Systems  Unable to perform ROS: Acuity of condition     Physical Exam Updated Vital Signs BP (!) 181/84   Pulse 93   Temp 98.6 F (37 C) (Axillary)   Resp  (!) 27   Ht 5' (1.524 m)   SpO2 100%   Physical Exam  Constitutional: She appears lethargic. She appears distressed.  HENT:  Head: Normocephalic.  Eyes: Pupils are equal, round, and reactive to light.  Neck:  c-collar in place  Cardiovascular: Regular rhythm and normal heart sounds.   Pulmonary/Chest:  Diffuse rhonci  Abdominal: Soft.  Musculoskeletal: She exhibits no deformity.  Neurological: She appears lethargic.  Generalized tonic-clonic seizure  Skin: Skin is warm and dry.  Psychiatric: She is noncommunicative.     ED Treatments / Results  Labs (all labs ordered are listed, but only abnormal results are displayed) Labs Reviewed  CBC WITH DIFFERENTIAL/PLATELET - Abnormal; Notable for the following:       Result Value   RBC 5.26 (*)    Hemoglobin 15.2 (*)    HCT 48.4 (*)    RDW 15.6 (*)    All other components within normal limits  COMPREHENSIVE METABOLIC PANEL - Abnormal; Notable for the following:    Sodium 130 (*)    Chloride 88 (*)    Glucose, Bld 772 (*)    BUN 25 (*)    Creatinine, Ser 5.40 (*)    ALT 12 (*)    Alkaline Phosphatase 158 (*)    GFR calc non Af Amer 8 (*)    GFR calc Af Amer 9 (*)    Anion gap 17 (*)    All other components within normal limits  CBG MONITORING, ED - Abnormal; Notable for the following:    Glucose-Capillary >600 (*)    All other components within normal limits  I-STAT CG4 LACTIC ACID, ED - Abnormal; Notable for the following:    Lactic Acid, Venous 2.13 (*)    All other components within normal limits  I-STAT ARTERIAL BLOOD GAS, ED - Abnormal; Notable for the following:    pH, Arterial 7.300 (*)    pCO2 arterial 68.9 (*)    pO2, Arterial 456.0 (*)    Bicarbonate 33.9 (*)    TCO2 36 (*)    Acid-Base Excess 4.0 (*)    All other  components within normal limits  I-STAT CHEM 8, ED - Abnormal; Notable for the following:    Sodium 130 (*)    Chloride 92 (*)    BUN 29 (*)    Creatinine, Ser 5.10 (*)    Glucose, Bld >700  (*)    Calcium, Ion 1.04 (*)    Hemoglobin 18.7 (*)    HCT 55.0 (*)    All other components within normal limits  ETHANOL  BETA-HYDROXYBUTYRIC ACID  AMMONIA  SALICYLATE LEVEL  ACETAMINOPHEN LEVEL  LACTIC ACID, PLASMA  I-STAT TROPONIN, ED    EKG  EKG Interpretation  Date/Time:  Tuesday January 05 2017 00:07:15 EDT Ventricular Rate:  113 PR Interval:    QRS Duration: 89 QT Interval:  315 QTC Calculation: 432 R Axis:   117 Text Interpretation:  Sinus tachycardia Biatrial enlargement Nonspecific ST and T wave abnormality Confirmed by Gilda Crease ([id number]) on 01/05/2017 1:18:42 AM       Radiology Dg Chest Port 1 View  Result Date: 01/05/2017 CLINICAL DATA:  Seizure, unresponsive EXAM: PORTABLE CHEST 1 VIEW COMPARISON:  12/24/2016 FINDINGS: Endotracheal tube tip is suspected to be at the carina. Esophageal tube tip is below the diaphragm. Right lung is clear. Borderline heart size with atherosclerosis. Patchy atelectasis at the left base. No pneumothorax. IMPRESSION: 1. Endotracheal tube tip appears at the carina 2. Borderline cardiomegaly. Patchy atelectasis at the left lung base. Electronically Signed   By: Jasmine Pang M.D.   On: 01/05/2017 02:14   Dg Abd Portable 1 View  Result Date: 01/05/2017 CLINICAL DATA:  OG tube placement EXAM: PORTABLE ABDOMEN - 1 VIEW COMPARISON:  CT 10/29/2016 FINDINGS: Esophageal tube tip and side port overlie the mid stomach. Patchy atelectasis or infiltrate at the left base. Nonobstructed gas pattern. Small calcification to the left of L4. IMPRESSION: Esophageal tube tip overlies the mid stomach. Electronically Signed   By: Jasmine Pang M.D.   On: 01/05/2017 02:17    Procedures Procedure Name: Intubation Date/Time: 01/05/2017 1:55 AM Performed by: Gilda Crease Pre-anesthesia Checklist: Patient identified, Patient being monitored, Emergency Drugs available, Suction available and Timeout performed Oxygen Delivery Method:  Non-rebreather mask Preoxygenation: Pre-oxygenation with 100% oxygen Induction Type: Rapid sequence and IV induction Ventilation: Mask ventilation without difficulty Laryngoscope Size: Glidescope and 3 Tube size: 7.5 mm Number of attempts: 1 Placement Confirmation: ETT inserted through vocal cords under direct vision,  CO2 detector and Breath sounds checked- equal and bilateral Dental Injury: Teeth and Oropharynx as per pre-operative assessment  Future Recommendations: Recommend- induction with short-acting agent, and alternative techniques readily available     CRITICAL CARE Performed by: Gilda Crease   Total critical care time: 40 minutes  Critical care time was exclusive of separately billable procedures and treating other patients.  Critical care was necessary to treat or prevent imminent or life-threatening deterioration.  Critical care was time spent personally by me on the following activities: development of treatment plan with patient and/or surrogate as well as nursing, discussions with consultants, evaluation of patient's response to treatment, examination of patient, obtaining history from patient or surrogate, ordering and performing treatments and interventions, ordering and review of laboratory studies, ordering and review of radiographic studies, pulse oximetry and re-evaluation of patient's condition.    (including critical care time)  Medications Ordered in ED Medications  etomidate (AMIDATE) injection 20 mg (20 mg Intravenous Canceled Entry 01/05/17 0033)  succinylcholine (ANECTINE) injection 100 mg ( Intravenous Canceled Entry 01/05/17 0033)  propofol (DIPRIVAN) 1000  MG/100ML infusion (10 mcg/kg/min  Rate/Dose Change 01/05/17 0042)  dextrose 5 %-0.45 % sodium chloride infusion (not administered)  insulin regular bolus via infusion 0-10 Units (not administered)  insulin regular (NOVOLIN R,HUMULIN R) 100 Units in sodium chloride 0.9 % 100 mL (1 Units/mL)  infusion (not administered)  dextrose 50 % solution 25 mL (not administered)  0.9 %  sodium chloride infusion ( Intravenous New Bag/Given 01/05/17 0130)  midazolam (VERSED) injection 5 mg (not administered)  propofol (DIPRIVAN) 1000 MG/100ML infusion (70 mcg/kg/min  59.9 kg Intravenous Rate/Dose Change 01/05/17 0226)  etomidate (AMIDATE) injection (20 mg Intravenous Given 01/05/17 0033)  succinylcholine (ANECTINE) injection (100 mg Intravenous Given 01/05/17 0033)  levETIRAcetam (KEPPRA) 1,000 mg in sodium chloride 0.9 % 100 mL IVPB (0 mg Intravenous Stopped 01/05/17 0142)  midazolam (VERSED) 5 MG/5ML injection (5 mg Intravenous Given 01/05/17 0038)  nicardipine (CARDENE) 20mg  in 0.86% saline IV infusion (0.1 mg/ml) (5 mg/hr Intravenous New Bag/Given 01/05/17 0200)     Initial Impression / Assessment and Plan / ED Course  I have reviewed the triage vital signs and the nursing notes.  Pertinent labs & imaging results that were available during my care of the patient were reviewed by me and considered in my medical decision making (see chart for details).     Patient presented to the ER by EMS. Patient had suffered a seizure at home, possibly a fall as well. She was found on the ground, little information was provided by family. Patient was nonverbal at arrival.  Reviewing records reveals that she has had similar presentations in the past. Patient apparently has had seizures previously, but does not appear to be on any antiepileptic medications. She was noted to have a DO NOT RESUSCITATE in place in Epic at arrival. This was apparently changed, however, during her brief hospitalization earlier this month. She had been a full code prior to that. Despite the order for the DO NOT RESUSCITATE, I did not see anything in the documentation that reflected a conversation with the patient and her family to change her CODE STATUS. I contacted her daughter, Byrd Hesselbach, who confirmed that she is not a DO NOT  RESUSCITATE and would want intubation.  Patient had just received her third dose of Versed at arrival to the ER. This briefly broke her third seizure, but ultimately started having seizures again here in the ER. It was therefore determined that the patient would require intubation. This was performed without difficulty. Patient given additional Versed and started on propofol for seizure control. Neurology has been consulted. She was loaded with Keppra.  It was unclear if her seizure was secondary to her elevated blood pressure or pressure secondary to the status epilepticus. After cessation of her seizures, however, blood pressure remained high. Blood pressure was then addressed with Cardene drip.  Patient sent to radiology for CT head and cervical spine.  Patient has significant hyperglycemia. She was started on glucostabilizer.  02:46 - discussed with Dr. Marchelle Gearing, critical care. Will see patient.  Final Clinical Impressions(s) / ED Diagnoses   Final diagnoses:  Status epilepticus (HCC)  Hyperglycemia  Acute respiratory failure with hypercapnia [facility])  Hypertensive emergency    New Prescriptions New Prescriptions   No medications on file     Gilda Crease, MD 01/05/17 [phone]

## 2017-01-05 NOTE — Progress Notes (Signed)
Seen by Dr. Laurence Slate earlier this AM.   Likely metabolic induced partial sz secondary to HONK or possibly PRES. She is being connected to LTM EEG given concern for continued encephalopathy.   LTM EEG BP Goal < 180 Wean propofol as tolerated.   Ritta Slot, MD [facility] [phone]  If 7pm- 7am, please [place] neurology on call as listed in AMION.

## 2017-01-05 NOTE — ED Notes (Signed)
Pt's daughter would like to be updated with a bed assignment/any updates. Byrd Hesselbach # [phone]

## 2017-01-05 NOTE — H&P (Signed)
PULMONARY / CRITICAL CARE MEDICINE   Name: Madeline [name] MRN: [id number] DOB: 04/06/1956    ADMISSION DATE:  01/04/2017 CONSULTATION DATE:  8/28  REFERRING MD:  Dr. Blinda Leatherwood  CHIEF COMPLAINT:  seizure  HISTORY OF PRESENT ILLNESS: Patient is encephalopathic and/or intubated. Therefore history has been obtained from chart review. 61 year old female with PMH as below, which is significant for ESRD on HD, COPD, DM, HTN, CHF, CAD, and medical non-compliance. She has several recent admissions related to missing dialysis, seizure, and HHNK. Most recently she was admitted earlier this month after missing dialysis, and seizure thought secondary to hyperglycemia. She left AMA from that admission. She presented to [facility] again 8/27 with complaint of seizure. She was reportedly at home when she was found by family on floor and unresponsive. Upon EMS arrival she was noted to be hyperglycemic, hypertensive, and suffered a seizure. Seizures were refractory to multiple doses of versed and she did not return to baseline. Upon arrival to ED she was intubated for airway protection, started on a propofol infusion, and loaded with Keppra. She was started on an insulin infusion as well as nicardipine. PCCM asked to admit.   PAST MEDICAL HISTORY :  She  has a past medical history of Abnormal Doppler ultrasound of carotid artery; Anasarca; Aseptic meningitis; CHF (congestive heart failure) (HCC); CKD (chronic kidney disease), stage III; COPD (chronic obstructive pulmonary disease) (HCC); Coronary artery disease; CVA (cerebral infarction); Diabetes mellitus (HCC); Dialysis patient [facility]); Hypertension; OSA (obstructive sleep apnea); Pulmonary hypertension (HCC); Renal insufficiency; Right heart failure (HCC); and Stroke (HCC) (2014).  PAST SURGICAL HISTORY: She  has a past surgical history that includes Tubal ligation; Abdominal hysterectomy; Esophagogastroduodenoscopy (N/A, 02/16/2013); Insertion of dialysis catheter  (Right, 03/03/2013); AV fistula placement (Left, 03/03/2013); right heart catheterization (N/A, 02/28/2013); and shuntogram (N/A, 07/13/2013).  No Known Allergies  No current facility-administered medications on file prior to encounter.    Current Outpatient Prescriptions on File Prior to Encounter  Medication Sig  . acetaminophen (TYLENOL) 500 MG tablet Take 500-1,000 mg by mouth every 6 (six) hours as needed (for pain).   Marland Kitchen albuterol (PROVENTIL HFA;VENTOLIN HFA) 108 (90 BASE) MCG/ACT inhaler Inhale 2 puffs into the lungs every 6 (six) hours as needed for wheezing or shortness of breath.   Marland Kitchen amLODipine (NORVASC) 10 MG tablet Take 10 mg by mouth daily.   Marland Kitchen aspirin EC 81 MG tablet Take 81 mg by mouth daily.  . carvedilol (COREG) 25 MG tablet Take 25 mg by mouth 2 (two) times daily with a meal.  . cinacalcet (SENSIPAR) 30 MG tablet Take 2 tablets (60 mg total) by mouth daily with supper.  . insulin glargine (LANTUS) 100 UNIT/ML injection Inject 0.17 mLs (17 Units total) into the skin at bedtime. (Patient not taking: Reported on 12/24/2016)  . insulin lispro (HUMALOG) 100 UNIT/ML injection Inject 3-10 Units into the skin 3 (three) times daily before meals. Per sliding scale: "BGL 200 = 3 units; 250 = 5 units; 300 = 7 units; 350 = 9-10 units"  . metoCLOPramide (REGLAN) 5 MG tablet Take 1 tablet (5 mg total) by mouth 4 (four) times daily -  before meals and at bedtime.  . mirtazapine (REMERON) 15 MG tablet Take 15 mg by mouth at bedtime.  . multivitamin (RENA-VIT) TABS tablet Take 1 tablet by mouth at bedtime. (Patient taking differently: Take 1 tablet by mouth daily. )  . nitroGLYCERIN (NITROSTAT) 0.4 MG SL tablet PLACE ONE TABLET UNDER THE TONGUE EVERY FIVE  MINUTES AS NEEDED FOR CHEST PAIN  . pantoprazole (PROTONIX) 40 MG tablet Take 1 tablet (40 mg total) by mouth 2 (two) times daily.  . polyethylene glycol (MIRALAX / GLYCOLAX) packet Take 17 g by mouth daily. (Patient taking differently: Take 17 g  by mouth daily as needed for mild constipation. )  . promethazine (PHENERGAN) 25 MG tablet Take 25 mg by mouth every 4 (four) hours as needed for nausea or vomiting.   . ranitidine (ZANTAC) 150 MG tablet Take 150 mg by mouth daily as needed for heartburn.  . rosuvastatin (CRESTOR) 10 MG tablet Take 1 tablet (10 mg total) by mouth daily.  . traMADol (ULTRAM) 50 MG tablet Take 50 mg by mouth 2 (two) times daily as needed (for pain).     FAMILY HISTORY:  Her indicated that her mother is deceased. She indicated that her father is deceased. She indicated that the status of her sister is unknown. She indicated that the status of her brother is unknown.    SOCIAL HISTORY: She  reports that she has quit smoking. Her smokeless tobacco use includes Snuff. She reports that she does not drink alcohol or use drugs.  REVIEW OF SYSTEMS:   Unable as patient is encephalopathic  SUBJECTIVE:    VITAL SIGNS: BP (!) 184/75   Pulse 88   Temp 98.6 F (37 C) (Axillary)   Resp 18   Ht 5' (1.524 m)   SpO2 100%   HEMODYNAMICS:    VENTILATOR SETTINGS: Vent Mode: PRVC FiO2 (%):  [50 %-100 %] 50 % Set Rate:  [18 bmp-24 bmp] 24 bmp Vt Set:  [360 mL] 360 mL PEEP:  [5 cmH20] 5 cmH20 Plateau Pressure:  [18 cmH20] 18 cmH20  INTAKE / OUTPUT: No intake/output data recorded.  PHYSICAL EXAMINATION: General:  Chronically ill appearing female on vent Neuro:  Sedated HEENT:  [place]/AT, PERRL, no JVD. C-collar Cardiovascular:  RRR, no MRG Lungs:  clear Abdomen:  Soft, non-distended Musculoskeletal:  No acute deformity Skin:  Grossly intact  LABS:  BMET  Recent Labs Lab 01/05/17 0019 01/05/17 0031  NA 130* 130*  K 3.5 3.5  CL 88* 92*  CO2 25  --   BUN 25* 29*  CREATININE 5.40* 5.10*  GLUCOSE 772* >700*    Electrolytes  Recent Labs Lab 01/05/17 0019  CALCIUM 9.2    CBC  Recent Labs Lab 01/05/17 0019 01/05/17 0031  WBC 8.7  --   HGB 15.2* 18.7*  HCT 48.4* 55.0*  PLT 154  --      Coag's No results for input(s): APTT, INR in the last 168 hours.  Sepsis Markers  Recent Labs Lab 01/05/17 0032  LATICACIDVEN 2.13*    ABG  Recent Labs Lab 01/05/17 0057  PHART 7.300*  PCO2ART 68.9*  PO2ART 456.0*    Liver Enzymes  Recent Labs Lab 01/05/17 0019  AST 23  ALT 12*  ALKPHOS 158*  BILITOT 0.6  ALBUMIN 3.9    Cardiac Enzymes No results for input(s): TROPONINI, PROBNP in the last 168 hours.  Glucose  Recent Labs Lab 01/05/17 0007 01/05/17 0241  GLUCAP >600* >600*    Imaging Ct Head Wo Contrast  Result Date: 01/05/2017 CLINICAL DATA:  Found down and unresponsive. EXAM: CT HEAD WITHOUT CONTRAST CT CERVICAL SPINE WITHOUT CONTRAST TECHNIQUE: Multidetector CT imaging of the head and cervical spine was performed following the standard protocol without intravenous contrast. Multiplanar CT image reconstructions of the cervical spine were also generated. COMPARISON:  Head CT 12/24/2016  FINDINGS: CT HEAD FINDINGS Brain: No mass lesion, intraparenchymal hemorrhage or extra-axial collection. No evidence of acute cortical infarct. There is periventricular hypoattenuation compatible with chronic microvascular disease. Vascular: Atherosclerotic calcification of the vertebral and internal carotid arteries at the skull base. Skull: Normal visualized skull base, calvarium and extracranial soft tissues. Sinuses/Orbits: No sinus fluid levels or advanced mucosal thickening. No mastoid effusion. Normal orbits. CT CERVICAL SPINE FINDINGS Alignment: No static subluxation. Facets are aligned. Occipital condyles are normally positioned. Skull base and vertebrae: No acute fracture. Soft tissues and spinal canal: No prevertebral fluid or swelling. No visible canal hematoma. Disc levels:  Disc space narrowing and endplate remodeling at C6-C7. Upper chest: Incompletely evaluated left upper lobe opacity, likely scarring. Other: Normal visualized paraspinal cervical soft tissues.  IMPRESSION: 1. Chronic hypertensive microangiopathy without acute intracranial abnormality. 2. No acute fracture or static subluxation of the cervical spine. Electronically Signed   By: Deatra Robinson M.D.   On: 01/05/2017 02:58   Ct Cervical Spine Wo Contrast  Result Date: 01/05/2017 CLINICAL DATA:  Found down and unresponsive. EXAM: CT HEAD WITHOUT CONTRAST CT CERVICAL SPINE WITHOUT CONTRAST TECHNIQUE: Multidetector CT imaging of the head and cervical spine was performed following the standard protocol without intravenous contrast. Multiplanar CT image reconstructions of the cervical spine were also generated. COMPARISON:  Head CT 12/24/2016 FINDINGS: CT HEAD FINDINGS Brain: No mass lesion, intraparenchymal hemorrhage or extra-axial collection. No evidence of acute cortical infarct. There is periventricular hypoattenuation compatible with chronic microvascular disease. Vascular: Atherosclerotic calcification of the vertebral and internal carotid arteries at the skull base. Skull: Normal visualized skull base, calvarium and extracranial soft tissues. Sinuses/Orbits: No sinus fluid levels or advanced mucosal thickening. No mastoid effusion. Normal orbits. CT CERVICAL SPINE FINDINGS Alignment: No static subluxation. Facets are aligned. Occipital condyles are normally positioned. Skull base and vertebrae: No acute fracture. Soft tissues and spinal canal: No prevertebral fluid or swelling. No visible canal hematoma. Disc levels:  Disc space narrowing and endplate remodeling at C6-C7. Upper chest: Incompletely evaluated left upper lobe opacity, likely scarring. Other: Normal visualized paraspinal cervical soft tissues. IMPRESSION: 1. Chronic hypertensive microangiopathy without acute intracranial abnormality. 2. No acute fracture or static subluxation of the cervical spine. Electronically Signed   By: Deatra Robinson M.D.   On: 01/05/2017 02:58   Dg Chest Port 1 View  Result Date: 01/05/2017 CLINICAL DATA:   Seizure, unresponsive EXAM: PORTABLE CHEST 1 VIEW COMPARISON:  12/24/2016 FINDINGS: Endotracheal tube tip is suspected to be at the carina. Esophageal tube tip is below the diaphragm. Right lung is clear. Borderline heart size with atherosclerosis. Patchy atelectasis at the left base. No pneumothorax. IMPRESSION: 1. Endotracheal tube tip appears at the carina 2. Borderline cardiomegaly. Patchy atelectasis at the left lung base. Electronically Signed   By: Jasmine Pang M.D.   On: 01/05/2017 02:14   Dg Abd Portable 1 View  Result Date: 01/05/2017 CLINICAL DATA:  OG tube placement EXAM: PORTABLE ABDOMEN - 1 VIEW COMPARISON:  CT 10/29/2016 FINDINGS: Esophageal tube tip and side port overlie the mid stomach. Patchy atelectasis or infiltrate at the left base. Nonobstructed gas pattern. Small calcification to the left of L4. IMPRESSION: Esophageal tube tip overlies the mid stomach. Electronically Signed   By: Jasmine Pang M.D.   On: 01/05/2017 02:17    STUDIES:  CT head C-spine 8/28 > Chronic hypertensive microangiopathy without acute intracranial abnormality. No acute fracture or static subluxation of the cervical spine.  CULTURES:   ANTIBIOTICS:   SIGNIFICANT  EVENTS:   LINES/TUBES: ETT 8/28 >  DISCUSSION: 61 year old female with ESRD, seizures admitted 8/28 with status epilepticus likely due to the combination of profound hyperglycemia and hypertension.   ASSESSMENT / PLAN:  PULMONARY A: Acute hypercarbic respiratory failure COPD without acute exacerbation OSA  P:   Full vent support ABG CXR in AM VAP bundle SBT in AM  CARDIOVASCULAR A:  Hypertensive urgency Chronic diastolic CHF CAD  P:  Telemetry monitoring SBP goal 180-200 first 6 hours, then less than 170 Nicardipine infusion Propofol contributing as well Cycle troponin  RENAL A:   ESRD on HD Hyponatremia suspect pseudo related to elevated glucose.  P:   No indication for urgent HD Consult nephrology in  AM\ Follow BMP  GASTROINTESTINAL A:   No acute issues  P:   NPO Protonix  HEMATOLOGIC A:   Likely hemoconcentrated  P:  Volume Follow CBC SQ heparin for VTE ppx  INFECTIOUS A:   No acute issues  P:     ENDOCRINE A:   DM with HHNK   P:   Aggressive IVF resuscitation Insulin infusion  NEUROLOGIC A:   Status epilepticus likely in setting of hyperglycemia History of CVA  P:   RASS goal: -1 to -2 Neurology following Keppra 500mg  BID EEG if mental status does not improve   FAMILY  - Updates: No family present, patient has legal guardian per chart review.   - Inter-disciplinary family meet or Palliative Care meeting due by:  9/4    Joneen Roach, AGACNP-BC [facility] Pager [phone] or [phone]  01/05/2017 3:54 AM

## 2017-01-06 ENCOUNTER — Inpatient Hospital Stay (HOSPITAL_COMMUNITY): Payer: Medicaid Other

## 2017-01-06 DIAGNOSIS — Z992 Dependence on renal dialysis: Secondary | ICD-10-CM

## 2017-01-06 DIAGNOSIS — G9340 Encephalopathy, unspecified: Secondary | ICD-10-CM

## 2017-01-06 DIAGNOSIS — N186 End stage renal disease: Secondary | ICD-10-CM

## 2017-01-06 DIAGNOSIS — E1101 Type 2 diabetes mellitus with hyperosmolarity with coma: Secondary | ICD-10-CM

## 2017-01-06 DIAGNOSIS — J9602 Acute respiratory failure with hypercapnia: Secondary | ICD-10-CM

## 2017-01-06 LAB — PHOSPHORUS
PHOSPHORUS: 3.1 mg/dL (ref 2.5–4.6)
Phosphorus: 4 mg/dL (ref 2.5–4.6)

## 2017-01-06 LAB — GLUCOSE, CAPILLARY
GLUCOSE-CAPILLARY: 100 mg/dL — AB (ref 65–99)
GLUCOSE-CAPILLARY: 114 mg/dL — AB (ref 65–99)
GLUCOSE-CAPILLARY: 170 mg/dL — AB (ref 65–99)
GLUCOSE-CAPILLARY: 84 mg/dL (ref 65–99)
GLUCOSE-CAPILLARY: 135 mg/dL — AB (ref 65–99)
Glucose-Capillary: 121 mg/dL — ABNORMAL HIGH (ref 65–99)
Glucose-Capillary: 137 mg/dL — ABNORMAL HIGH (ref 65–99)

## 2017-01-06 LAB — CBC
HCT: 44.6 % (ref 36.0–46.0)
Hemoglobin: 14.1 g/dL (ref 12.0–15.0)
MCH: 28.2 pg (ref 26.0–34.0)
MCHC: 31.6 g/dL (ref 30.0–36.0)
MCV: 89.2 fL (ref 78.0–100.0)
Platelets: 158 10*3/uL (ref 150–400)
RBC: 5 MIL/uL (ref 3.87–5.11)
RDW: 15.7 % — AB (ref 11.5–15.5)
WBC: 14.8 10*3/uL — ABNORMAL HIGH (ref 4.0–10.5)

## 2017-01-06 LAB — RENAL FUNCTION PANEL
ALBUMIN: 2.7 g/dL — AB (ref 3.5–5.0)
ANION GAP: 10 (ref 5–15)
BUN: 8 mg/dL (ref 6–20)
CALCIUM: 8 mg/dL — AB (ref 8.9–10.3)
CO2: 27 mmol/L (ref 22–32)
CREATININE: 2.75 mg/dL — AB (ref 0.44–1.00)
Chloride: 97 mmol/L — ABNORMAL LOW (ref 101–111)
GFR calc Af Amer: 21 mL/min — ABNORMAL LOW (ref >60)
GFR calc non Af Amer: 18 mL/min — ABNORMAL LOW (ref >60)
GLUCOSE: 80 mg/dL (ref 65–99)
Phosphorus: 2.9 mg/dL (ref 2.5–4.6)
Potassium: 4.7 mmol/L (ref 3.5–5.1)
SODIUM: 134 mmol/L — AB (ref 135–145)

## 2017-01-06 LAB — MAGNESIUM
MAGNESIUM: 1.8 mg/dL (ref 1.7–2.4)
Magnesium: 1.9 mg/dL (ref 1.7–2.4)

## 2017-01-06 MED ORDER — PRO-STAT SUGAR FREE PO LIQD
30.0000 mL | Freq: Three times a day (TID) | ORAL | Status: DC
  Administered 2017-01-06 (×2): 30 mL
  Filled 2017-01-06 (×2): qty 30

## 2017-01-06 MED ORDER — IPRATROPIUM-ALBUTEROL 0.5-2.5 (3) MG/3ML IN SOLN
3.0000 mL | Freq: Four times a day (QID) | RESPIRATORY_TRACT | Status: DC | PRN
  Administered 2017-01-08: 3 mL via RESPIRATORY_TRACT
  Filled 2017-01-06: qty 3

## 2017-01-06 MED ORDER — FENTANYL CITRATE (PF) 100 MCG/2ML IJ SOLN
100.0000 ug | INTRAMUSCULAR | Status: DC | PRN
  Administered 2017-01-06 – 2017-01-07 (×5): 100 ug via INTRAVENOUS
  Filled 2017-01-06 (×5): qty 2

## 2017-01-06 MED ORDER — PRO-STAT SUGAR FREE PO LIQD
30.0000 mL | Freq: Two times a day (BID) | ORAL | Status: DC
  Administered 2017-01-06: 30 mL
  Filled 2017-01-06 (×2): qty 30

## 2017-01-06 MED ORDER — FENTANYL CITRATE (PF) 100 MCG/2ML IJ SOLN: 100.0000 ug | INTRAMUSCULAR | Status: DC | PRN

## 2017-01-06 MED ORDER — VITAL HIGH PROTEIN PO LIQD: 1000.0000 mL | ORAL | Status: DC

## 2017-01-06 MED ORDER — NEPRO/CARBSTEADY PO LIQD
1000.0000 mL | ORAL | Status: DC
  Administered 2017-01-06: 1000 mL
  Filled 2017-01-06 (×3): qty 1000

## 2017-01-06 MED ORDER — MIDAZOLAM HCL 2 MG/2ML IJ SOLN
2.0000 mg | INTRAMUSCULAR | Status: AC | PRN
  Administered 2017-01-06 (×2): 2 mg via INTRAVENOUS
  Filled 2017-01-06 (×2): qty 2

## 2017-01-06 NOTE — Progress Notes (Signed)
Subjective:  Still intubated , tolerated  1 liter uf hd yesterday  With HD  Objective Vital signs in last 24 hours: Vitals:   01/06/17 0600 01/06/17 0700 01/06/17 0748 01/06/17 0800  BP: (!) 174/77 (!) 165/76 (!) 167/74 (!) 169/75  Pulse: 67 64 71 66  Resp: _0 Temp:    98.3 F (36.8 C)  TempSrc:    Axillary  SpO2: 100% 100% 100% 100%  Weight:      Height:       Weight change: 0.7 kg (1 lb 8.7 oz)  Physical Exam: General: Intubated , Somnolent ,not opening eyes for me, no following commands but just given sedation  Heart: RRR , no mur , rub or gallop  Lungs: CTA  On Ventilation  Abdomen: bs pos., nt , nd Extremities: no pedal edema Dialysis Access: pos bruit LUA AVF    Dialysis Orders: [facility]: [facility]   on , TueThuSat,  EDW 58 (kg) 3.0 K, 2.25 Ca,  Profile 4,  AV Fistula-LUA  (Hectorol) 8 mcg  Heparin SodiumBolus 4000 units  Mircera 225 mcg IVP  q 2wks last on 12/31/16   Problem/Plan: 1. Acute Hypercarbic REsp  Failure on Vent - CCM  managing  2. Hypertensive Urgency ( ho recurrence )- on admit needed Nicardipine infusion, Propofol . No excess vol./ now  bp= 146/69  Now on Norvasc 38m  q day po via Tube feed  and Carvedilol  25 mg bid via po tube feed 3. ESRD -  HD  TTS  / tolerated HD yest  Attempt  1 l uf again   / CXR today no excess volume 4. Status epilepticus - Neurology seeing>  EEG / MRI 5. Anemia  -hgb 14.1 no esa needed , fu trend 6. Metabolic bone disease -  Iv hectorol . Phos 3.1 . Binder when pos  7. DM  Uncontrolled on Insulin drip on admit  - now on Lantus and SSI 8. Compliance problems  With meds/ HD - need further discussion with pt  When extubated /    DErnest Haber PA-C [phone]/29/2018,12:05 PM  LOS: 1 day    Renal Attending: BP sl elevated.  Remains vent dependent. [facility]. [facility]   Labs: Basic Metabolic Panel:  Recent Labs Lab 01/05/17 0358 01/05/17 1639 01/06/17 0227 01/06/17 1007   NA 130* 136 134*  --   K 3.1* 2.6* 4.7  --   CL 93* 101 97*  --   CO2 _1 --   GLUCOSE 662* 85 80  --   BUN 26* 20 8  --   CREATININE 5.23* 4.50* 2.75*  --   CALCIUM 8.1* 8.3* 8.0*  --   PHOS 5.2* 3.8 2.9 3.1   Liver Function Tests:  Recent Labs Lab 01/05/17 0019 01/05/17 1639 01/06/17 0227  AST 23  --   --   ALT 12*  --   --   ALKPHOS 158*  --   --   BILITOT 0.6  --   --   PROT 8.1  --   --   ALBUMIN 3.9 2.4* 2.7*   No results for input(s): LIPASE, AMYLASE in the last 168 hours.  Recent Labs Lab 01/05/17 0319  AMMONIA 31   CBC:  Recent Labs Lab 01/05/17 0019  01/05/17 0543 01/05/17 1629 01/06/17 0227  WBC 8.7  --  12.0* 4.5 14.8*  NEUTROABS 6.4  --   --   --   --  HGB 15.2*  < > 12.5 11.9* 14.1  HCT 48.4*  < > 39.9 38.3 44.6  MCV 92.0  --  89.1 88.5 89.2  PLT 154  --  164 144* 158  < > = values in this interval not displayed. Cardiac Enzymes:  Recent Labs Lab 01/05/17 0358 01/05/17 1139 01/05/17 1559  TROPONINI 0.11* 0.18* 0.13*   CBG:  Recent Labs Lab 01/05/17 2010 01/05/17 2321 01/06/17 0328 01/06/17 0741 01/06/17 1129  GLUCAP 108* 121* 100* 137* 114*    Studies/Results: Ct Head Wo Contrast  Result Date: 01/05/2017 CLINICAL DATA:  Found down and unresponsive. EXAM: CT HEAD WITHOUT CONTRAST CT CERVICAL SPINE WITHOUT CONTRAST TECHNIQUE: Multidetector CT imaging of the head and cervical spine was performed following the standard protocol without intravenous contrast. Multiplanar CT image reconstructions of the cervical spine were also generated. COMPARISON:  Head CT 12/24/2016 FINDINGS: CT HEAD FINDINGS Brain: No mass lesion, intraparenchymal hemorrhage or extra-axial collection. No evidence of acute cortical infarct. There is periventricular hypoattenuation compatible with chronic microvascular disease. Vascular: Atherosclerotic calcification of the vertebral and internal carotid arteries at the skull base. Skull: Normal visualized skull  base, calvarium and extracranial soft tissues. Sinuses/Orbits: No sinus fluid levels or advanced mucosal thickening. No mastoid effusion. Normal orbits. CT CERVICAL SPINE FINDINGS Alignment: No static subluxation. Facets are aligned. Occipital condyles are normally positioned. Skull base and vertebrae: No acute fracture. Soft tissues and spinal canal: No prevertebral fluid or swelling. No visible canal hematoma. Disc levels:  Disc space narrowing and endplate remodeling at W0-J8. Upper chest: Incompletely evaluated left upper lobe opacity, likely scarring. Other: Normal visualized paraspinal cervical soft tissues. IMPRESSION: 1. Chronic hypertensive microangiopathy without acute intracranial abnormality. 2. No acute fracture or static subluxation of the cervical spine. Electronically Signed   By: Ulyses Jarred M.D.   On: 01/05/2017 02:58   Ct Cervical Spine Wo Contrast  Result Date: 01/05/2017 CLINICAL DATA:  Found down and unresponsive. EXAM: CT HEAD WITHOUT CONTRAST CT CERVICAL SPINE WITHOUT CONTRAST TECHNIQUE: Multidetector CT imaging of the head and cervical spine was performed following the standard protocol without intravenous contrast. Multiplanar CT image reconstructions of the cervical spine were also generated. COMPARISON:  Head CT 12/24/2016 FINDINGS: CT HEAD FINDINGS Brain: No mass lesion, intraparenchymal hemorrhage or extra-axial collection. No evidence of acute cortical infarct. There is periventricular hypoattenuation compatible with chronic microvascular disease. Vascular: Atherosclerotic calcification of the vertebral and internal carotid arteries at the skull base. Skull: Normal visualized skull base, calvarium and extracranial soft tissues. Sinuses/Orbits: No sinus fluid levels or advanced mucosal thickening. No mastoid effusion. Normal orbits. CT CERVICAL SPINE FINDINGS Alignment: No static subluxation. Facets are aligned. Occipital condyles are normally positioned. Skull base and vertebrae:  No acute fracture. Soft tissues and spinal canal: No prevertebral fluid or swelling. No visible canal hematoma. Disc levels:  Disc space narrowing and endplate remodeling at J1-B1. Upper chest: Incompletely evaluated left upper lobe opacity, likely scarring. Other: Normal visualized paraspinal cervical soft tissues. IMPRESSION: 1. Chronic hypertensive microangiopathy without acute intracranial abnormality. 2. No acute fracture or static subluxation of the cervical spine. Electronically Signed   By: Ulyses Jarred M.D.   On: 01/05/2017 02:58   Dg Chest Port 1 View  Result Date: 01/06/2017 CLINICAL DATA:  Respiratory failure, seizure EXAM: PORTABLE CHEST 1 VIEW COMPARISON:  01/05/2017 FINDINGS: Endotracheal tube and NG tube remain in place, unchanged. Heart is borderline in size. Left base atelectasis. Right lung is clear. No effusions. IMPRESSION: Left base atelectasis.  Borderline  cardiomegaly. Electronically Signed   By: Rolm Baptise M.D.   On: 01/06/2017 07:24   Dg Chest Port 1 View  Result Date: 01/05/2017 CLINICAL DATA:  Seizure, unresponsive EXAM: PORTABLE CHEST 1 VIEW COMPARISON:  12/24/2016 FINDINGS: Endotracheal tube tip is suspected to be at the carina. Esophageal tube tip is below the diaphragm. Right lung is clear. Borderline heart size with atherosclerosis. Patchy atelectasis at the left base. No pneumothorax. IMPRESSION: 1. Endotracheal tube tip appears at the carina 2. Borderline cardiomegaly. Patchy atelectasis at the left lung base. Electronically Signed   By: Donavan Foil M.D.   On: 01/05/2017 02:14   Dg Abd Portable 1 View  Result Date: 01/05/2017 CLINICAL DATA:  OG tube placement EXAM: PORTABLE ABDOMEN - 1 VIEW COMPARISON:  CT 10/29/2016 FINDINGS: Esophageal tube tip and side port overlie the mid stomach. Patchy atelectasis or infiltrate at the left base. Nonobstructed gas pattern. Small calcification to the left of L4. IMPRESSION: Esophageal tube tip overlies the mid stomach.  Electronically Signed   By: Donavan Foil M.D.   On: 01/05/2017 02:17   Medications: . sodium chloride    . dextrose 40 mL/hr at 01/06/17 0600  . levETIRAcetam Stopped (01/06/17 1036)  . niCARDipine Stopped (01/05/17 0730)   . amLODipine  10 mg Per Tube Daily  . aspirin EC  81 mg Oral Daily  . budesonide (PULMICORT) nebulizer solution  0.5 mg Nebulization BID  . carvedilol  25 mg Per Tube BID WC  . chlorhexidine gluconate (MEDLINE KIT)  15 mL Mouth Rinse BID  . cinacalcet  60 mg Oral Q supper  . feeding supplement (PRO-STAT SUGAR FREE 64)  30 mL Per Tube BID  . feeding supplement (VITAL HIGH PROTEIN)  1,000 mL Per Tube Q24H  . heparin  5,000 Units Subcutaneous Q8H  . insulin aspart  1-3 Units Subcutaneous Q4H  . insulin glargine  10 Units Subcutaneous Daily  . mouth rinse  15 mL Mouth Rinse 10 times per day  . multivitamin  1 tablet Oral Daily  . pantoprazole (PROTONIX) IV  40 mg Intravenous QHS  . rosuvastatin  10 mg Per Tube Daily

## 2017-01-06 NOTE — Procedures (Signed)
LTM-EEG Report  HISTORY: Continuous video-EEG monitoring performed for 61 year old with altered mental status.  ACQUISITION: International 10-20 system for electrode placement; 18 channels with additional eyes linked to ipsilateral ears and EKG. Additional T1-T2 electrodes were used. Continuous video recording obtained.   EEG NUMBER:   MEDICATIONS:  Day 1: LEV  DAY #1: from 1006 01/05/17 to  0730 01/06/17  BACKGROUND: An overall medium voltage discontinuous recording with poor spontaneous variability and atypical reactivity. Waking background consisted of medium voltage rhythmic delta activity with sparse superimposed theta and alpha activity bilaterally (decreased over the right) and no clear evidence of a posterior dominant rhythm. Sleep was characterized by a partial burst-suppression pattern with bursts of delta-theta activity bilaterally lasting 1-4 seconds (decreased over the right) and periods of attenuations lasting 1-3 seconds.   EPILEPTIFORM/PERIODIC ACTIVITY: Occasional isolated left anterior sharp waves in the bursts  SEIZURES: none EVENTS:  none  EKG: no significant arrhythmia  SUMMARY: This was a moderately abnormal continuous video EEG due to loss of normal background features, asymmetric generalized slowing with loss of detail over the right hemisphere, and some epileptiform activity in the left anterior regions. There were no seizures. This was indicative of a diffuse and focal cerebral disturbance (maximal right) with some left anterior epileptogenic potential.

## 2017-01-06 NOTE — Progress Notes (Signed)
Initial Nutrition Assessment  INTERVENTION:   Nepro @ 25 ml/hr (600 ml/day) 30 ml Prostat TID Provides: 1380 kcal, 93 grams protein, and 436 ml free water.    NUTRITION DIAGNOSIS:   Increased nutrient needs related to  (ESRD on HD) as evidenced by estimated needs.  GOAL:   Patient will meet greater than or equal to 90% of their needs  MONITOR:   TF tolerance, Skin, Labs, Vent status  REASON FOR ASSESSMENT:   Consult, Ventilator Enteral/tube feeding initiation and management  ASSESSMENT:   Pt with PMH of ESRD on HD TTS, COPD, DM, HTN, CHF, CAD, and medical non-compliance. Several recent admissions related to missing HD, seizures, and HHNK now admitted with the same.    Pt discussed during ICU rounds and with RN.   Per MD pt tolerated 1 L uf HD 8/28 Patient is currently intubated on ventilator support MV: 7.2 L/min Temp (24hrs), Avg:98 F (36.7 C), Min:97.5 F (36.4 C), Max:99.4 F (37.4 C)  Medications reviewed and include: lantus, rena-vit, crestor Labs reviewed: electrolytes now WNL CBG's: [phone]  Diet Order:  Diet NPO time specified  Skin:   (skin tear sacrum)  Last BM:  8/28  Height:   Ht Readings from Last 1 Encounters:  01/05/17 5' (1.524 m)    Weight:   Wt Readings from Last 1 Encounters:  01/06/17 134 lb 11.2 oz (61.1 kg)    Ideal Body Weight:  45.4 kg  BMI:  Body mass index is 26.31 kg/m.  Estimated Nutritional Needs:   Kcal:  1324  Protein:  85-100 grams  Fluid:  1.2 L/day  EDUCATION NEEDS:   No education needs identified at this time  Kendell BaneHeather Alex Mcmanigal RD, LDN, CNSC [phone] Pager [phone] After Hours Pager

## 2017-01-06 NOTE — Progress Notes (Signed)
LTM discontinued. No skin breakdown was seen. 

## 2017-01-06 NOTE — Progress Notes (Signed)
Subjective: EEG read pending, but with sedation held, appears to be working on some  Exam: Vitals:   01/06/17 0748 01/06/17 0800  BP: (!) 167/74 (!) 169/75  Pulse: 71 66  Resp: 16 15  Temp:  98.3 F (36.8 C)  SpO2: 100% 100%   Gen: In bed, NAD Resp: non-labored breathing, no acute distress Abd: soft, nt  Neuro: MS: Opens eyes, but does not follow commands CN: Eyes with downward gaze, pupils are reactive bilaterally, face symmetric Motor: she localizes bilaterally Sensory:as above.   Pertinent Labs: Cr 2.75  Impression: 61 year old female who presented in status epilepticus in the setting of severe hypertension and severe hyperglycemia. Possibilities include seizure secondary to hyperosmolar state or posterior reversible encephalopathy syndrome (pres). She will need further evaluation with MRI.  Recommendations: 1) discontinue LTM EEG 2) MRI brain 3) continue levetiracetam 500mg  BID  Ritta SlotMcNeill Almon Whitford, MD [facility] [phone]  If 7pm- 7am, please [place] neurology on call as listed in AMION.

## 2017-01-06 NOTE — Progress Notes (Signed)
Patient transported from MRI back to 4N18 without complications. VS remained stable throughout procedure and transport. RT will continue to monitor patient.

## 2017-01-06 NOTE — Progress Notes (Signed)
PULMONARY / CRITICAL CARE MEDICINE   Name: Madeline [name] MRN: [id number] DOB: 1955-06-20    ADMISSION DATE:  01/04/2017 CONSULTATION DATE:  8/28  REFERRING MD:  Dr. Blinda Leatherwood  CHIEF COMPLAINT:  seizure  Brief:  61 yo female former smoker with status epilepticus, HONK, compromised airway. Hx of ESRD, COPD, DM, HTN, CAD.  Reportedly non compliant with therapy.  SUBJECTIVE:  Remains on vent, propofol off. No events overnight. Currently weaning   VITAL SIGNS: BP (!) 169/75   Pulse 66   Temp 98.3 F (36.8 C) (Axillary)   Resp 15   Ht 5' (1.524 m)   Wt 61.1 kg (134 lb 11.2 oz)   SpO2 100%   BMI 26.31 kg/m   VENTILATOR SETTINGS: Vent Mode: PRVC FiO2 (%):  [30 %] 30 % Set Rate:  [16 bmp] 16 bmp Vt Set:  [360 mL] 360 mL PEEP:  [5 cmH20] 5 cmH20 Plateau Pressure:  [14 cmH20-19 cmH20] 17 cmH20  INTAKE / OUTPUT: I/O last 3 completed shifts: In: 2461.5 [I.V.:1351.5; IV Piggyback:1110] Out: 1000 [Other:1000]  PHYSICAL EXAMINATION:  General adult female, no distress  HEENT - ETT in place, MMM  Cardiac - RRR, no MRG  Chest - Clear breath sounds, non-labored, no wheeze/crackles  Abd - soft, non tender, active bowel sounds  Ext - -edema  Skin - warm, dry, intact  Neuro - Sedated, does not follow commands, moves extremities    LABS:  BMET  Recent Labs Lab 01/05/17 0358 01/05/17 1639 01/06/17 0227  NA 130* 136 134*  K 3.1* 2.6* 4.7  CL 93* 101 97*  CO2 23 26 27   BUN 26* 20 8  CREATININE 5.23* 4.50* 2.75*  GLUCOSE 662* 85 80    Electrolytes  Recent Labs Lab 01/05/17 0358 01/05/17 1639 01/06/17 0227  CALCIUM 8.1* 8.3* 8.0*  MG 2.1  --   --   PHOS 5.2* 3.8 2.9    CBC  Recent Labs Lab 01/05/17 0543 01/05/17 1629 01/06/17 0227  WBC 12.0* 4.5 14.8*  HGB 12.5 11.9* 14.1  HCT 39.9 38.3 44.6  PLT 164 144* 158    Coag's No results for input(s): APTT, INR in the last 168 hours.  Sepsis Markers  Recent Labs Lab 01/05/17 0032 01/05/17 0319   LATICACIDVEN 2.13* 2.3*    ABG  Recent Labs Lab 01/05/17 0057 01/05/17 0514  PHART 7.300* 7.452*  PCO2ART 68.9* 37.7  PO2ART 456.0* 170.0*    Liver Enzymes  Recent Labs Lab 01/05/17 0019 01/05/17 1639 01/06/17 0227  AST 23  --   --   ALT 12*  --   --   ALKPHOS 158*  --   --   BILITOT 0.6  --   --   ALBUMIN 3.9 2.4* 2.7*    Cardiac Enzymes  Recent Labs Lab 01/05/17 0358 01/05/17 1139 01/05/17 1559  TROPONINI 0.11* 0.18* 0.13*    Glucose  Recent Labs Lab 01/05/17 1201 01/05/17 1558 01/05/17 2010 01/05/17 2321 01/06/17 0328 01/06/17 0741  GLUCAP 144* 118* 108* 121* 100* 137*    Imaging Dg Chest Port 1 View  Result Date: 01/06/2017 CLINICAL DATA:  Respiratory failure, seizure EXAM: PORTABLE CHEST 1 VIEW COMPARISON:  01/05/2017 FINDINGS: Endotracheal tube and NG tube remain in place, unchanged. Heart is borderline in size. Left base atelectasis. Right lung is clear. No effusions. IMPRESSION: Left base atelectasis.  Borderline cardiomegaly. Electronically Signed   By: Charlett Nose M.D.   On: 01/06/2017 07:24    STUDIES:  CT head C-spine 8/28 >  Chronic hypertensive microangiopathy without acute intracranial abnormality. No acute fracture or static subluxation of the cervical spine.  CULTURES: None.   ANTIBIOTICS: None.   SIGNIFICANT EVENTS: 8/28 Admit, neuro and renal consulted  LINES/TUBES: ETT 8/28 >  DISCUSSION: 61 year old female with ESRD, seizures admitted 8/28 with status epilepticus likely due to the combination of profound hyperglycemia and hypertension.   ASSESSMENT / PLAN:  PULMONARY A: Acute hypercarbic respiratory failure COPD without acute exacerbation OSA P:   Vent Support Wean as tolerated > currently weaning 5/5 Trend ABG/CXR Scheduled Pulmicort PRN duoneb  Will reassess for extubation when fully awake   CARDIOVASCULAR A:  Hypertensive urgency Chronic diastolic CHF CAD P:  Cardiac Monitoring  Wean Cardene to  Maintain SBP < 180 Continue Norvasc, ASA, Crestor   RENAL A:   ESRD on HD P:   Nephrology following  HD > TTHS  GASTROINTESTINAL A:   Nutrition P:   NPO PPI Will start TF today unless able to extubate   HEMATOLOGIC A:  No acute issues P:  Trend CBC SQ heparin for VTE ppx  INFECTIOUS A:   No acute issues P:   Trend WBC and Fever Curve   ENDOCRINE A:   DM with HHNK  P:   Trend Glucose  SSI Lantus 10 u daily   NEUROLOGIC A:   Status epilepticus likely secondary to metabolic derangements History of CVA P:   RASS goal 0 to -1 Neurology Following  Propofol d/c 8/29 due to high triglycerides PRN fentanyl  AEDs per neuro   CC time 34 minutes  Jovita Kussmaul, AGACNP-BC [facility]  Pgr: [phone]  PCCM Pgr: [phone]

## 2017-01-07 ENCOUNTER — Inpatient Hospital Stay (HOSPITAL_COMMUNITY): Payer: Medicaid Other

## 2017-01-07 LAB — GLUCOSE, CAPILLARY
GLUCOSE-CAPILLARY: 72 mg/dL (ref 65–99)
GLUCOSE-CAPILLARY: 75 mg/dL (ref 65–99)
GLUCOSE-CAPILLARY: 89 mg/dL (ref 65–99)
Glucose-Capillary: 162 mg/dL — ABNORMAL HIGH (ref 65–99)
Glucose-Capillary: 90 mg/dL (ref 65–99)
Glucose-Capillary: 130 mg/dL — ABNORMAL HIGH (ref 65–99)
Glucose-Capillary: 55 mg/dL — ABNORMAL LOW (ref 65–99)

## 2017-01-07 LAB — CBC
HCT: 41.2 % (ref 36.0–46.0)
Hemoglobin: 13.1 g/dL (ref 12.0–15.0)
MCH: 28.6 pg (ref 26.0–34.0)
MCHC: 31.8 g/dL (ref 30.0–36.0)
MCV: 90 fL (ref 78.0–100.0)
PLATELETS: 135 10*3/uL — AB (ref 150–400)
RBC: 4.58 MIL/uL (ref 3.87–5.11)
RDW: 16.3 % — AB (ref 11.5–15.5)
WBC: 14 10*3/uL — AB (ref 4.0–10.5)

## 2017-01-07 LAB — PHOSPHORUS: PHOSPHORUS: 5.4 mg/dL — AB (ref 2.5–4.6)

## 2017-01-07 LAB — BASIC METABOLIC PANEL
Anion gap: 12 (ref 5–15)
BUN: 30 mg/dL — AB (ref 6–20)
CALCIUM: 8 mg/dL — AB (ref 8.9–10.3)
CO2: 24 mmol/L (ref 22–32)
CREATININE: 4.1 mg/dL — AB (ref 0.44–1.00)
Chloride: 98 mmol/L — ABNORMAL LOW (ref 101–111)
GFR calc Af Amer: 13 mL/min — ABNORMAL LOW (ref >60)
GFR, EST NON AFRICAN AMERICAN: 11 mL/min — AB (ref >60)
Glucose, Bld: 131 mg/dL — ABNORMAL HIGH (ref 65–99)
POTASSIUM: 3.7 mmol/L (ref 3.5–5.1)
SODIUM: 134 mmol/L — AB (ref 135–145)

## 2017-01-07 LAB — MAGNESIUM: MAGNESIUM: 1.8 mg/dL (ref 1.7–2.4)

## 2017-01-07 MED ORDER — DEXTROSE 50 % IV SOLN
INTRAVENOUS | Status: AC
  Administered 2017-01-07: 25 mL
  Filled 2017-01-07: qty 50

## 2017-01-07 MED ORDER — FENTANYL CITRATE (PF) 100 MCG/2ML IJ SOLN: 25.0000 ug | INTRAMUSCULAR | Status: DC | PRN

## 2017-01-07 MED ORDER — SODIUM CHLORIDE 0.9 % IV SOLN
750.0000 mg | Freq: Every day | INTRAVENOUS | Status: DC
  Filled 2017-01-07: qty 7.5

## 2017-01-07 MED ORDER — SODIUM CHLORIDE 0.9 % IV SOLN: 375.0000 mg | INTRAVENOUS | Status: DC

## 2017-01-07 MED ORDER — LEVETIRACETAM 500 MG/5ML IV SOLN
250.0000 mg | Freq: Once | INTRAVENOUS | Status: AC
  Administered 2017-01-07: 250 mg via INTRAVENOUS
  Filled 2017-01-07: qty 2.5

## 2017-01-07 MED ORDER — ROSUVASTATIN CALCIUM 10 MG PO TABS
10.0000 mg | ORAL_TABLET | Freq: Every day | ORAL | Status: DC
  Administered 2017-01-08 – 2017-01-10 (×3): 10 mg via ORAL
  Filled 2017-01-07 (×4): qty 1

## 2017-01-07 MED ORDER — DEXTROSE 50 % IV SOLN
25.0000 mL | Freq: Once | INTRAVENOUS | Status: AC
  Administered 2017-01-07: 25 mL via INTRAVENOUS

## 2017-01-07 MED ORDER — ACETAMINOPHEN 325 MG PO TABS: 650.0000 mg | ORAL_TABLET | Freq: Four times a day (QID) | ORAL | Status: DC | PRN

## 2017-01-07 MED ORDER — ACETAMINOPHEN 650 MG RE SUPP: 650.0000 mg | RECTAL | Status: DC | PRN

## 2017-01-07 MED ORDER — SODIUM CHLORIDE 0.9 % IV SOLN: 500.0000 mg | INTRAVENOUS | Status: DC

## 2017-01-07 MED ORDER — AMLODIPINE BESYLATE 10 MG PO TABS
10.0000 mg | ORAL_TABLET | Freq: Every day | ORAL | Status: DC
  Administered 2017-01-08 – 2017-01-10 (×2): 10 mg via ORAL
  Filled 2017-01-07 (×2): qty 1

## 2017-01-07 MED ORDER — CARVEDILOL 25 MG PO TABS
25.0000 mg | ORAL_TABLET | Freq: Two times a day (BID) | ORAL | Status: DC
  Administered 2017-01-08 – 2017-01-10 (×3): 25 mg via ORAL
  Filled 2017-01-07: qty 2
  Filled 2017-01-07: qty 1
  Filled 2017-01-07: qty 2
  Filled 2017-01-07 (×2): qty 1

## 2017-01-07 MED ORDER — SODIUM CHLORIDE 0.9 % IV SOLN: 500.0000 mg | Freq: Once | INTRAVENOUS | Status: DC

## 2017-01-07 MED ORDER — HYDRALAZINE HCL 20 MG/ML IJ SOLN
10.0000 mg | INTRAMUSCULAR | Status: DC | PRN
  Administered 2017-01-08: 10 mg via INTRAVENOUS
  Filled 2017-01-07: qty 1

## 2017-01-07 MED ORDER — LABETALOL HCL 5 MG/ML IV SOLN: 10.0000 mg | INTRAVENOUS | Status: DC | PRN

## 2017-01-07 MED ORDER — SODIUM CHLORIDE 0.9 % IV SOLN: 1000.0000 mg | Freq: Every day | INTRAVENOUS | Status: DC

## 2017-01-07 NOTE — Procedures (Signed)
Extubation Procedure Note  Patient Details:   Name: Madeline [name] DOB: 1956-04-10 MRN: [id number]   Airway Documentation:     Evaluation  O2 sats: stable throughout Complications: No apparent complications Patient did tolerate procedure well. Bilateral Breath Sounds: Rhonchi, Diminished   Yes   No Cuff leak noted. MD notified prior to extubation.  Pt placed on [place] 3 L with humidity, no stridor noted, Pt able to reach 500 using incentive spirometer.  Rayburn FeltJean S Tonyetta Berko 01/07/2017, 8:39 AM

## 2017-01-07 NOTE — Progress Notes (Signed)
Dialysis treatment completed.  1500 mL ultrafiltrated and net fluid removal 1000 mL.    Patient status unchanged. Lung sounds diminished and coarse to ausculation in all fields. No edema. Cardiac: NSR.  Disconnected lines and removed needles.  Pressure held for 10 minutes and band aid/gauze dressing applied.  Report given to bedside RN, Jonny RuizJohn.

## 2017-01-07 NOTE — Progress Notes (Signed)
Arrived to patient room 4N-18 at 1022.  Reviewed treatment plan and this RN agrees.  Report received from bedside RN, Molli HazardMatthew.  Consent verified.  Patient Opens eyes to voice, commands. Lung sounds diminished and coarse to ausculation in all fields. No edema. Cardiac: NSR.  Prepped LUAVF with alcohol and cannulated with two 15 gauge needles.  Pulsation of blood noted.  Flushed access well with saline per protocol.  Connected and secured lines and initiated tx at 1030.  UF goal of 1500 mL and net fluid removal of 1000 mL.  Will continue to monitor.

## 2017-01-07 NOTE — Progress Notes (Signed)
[facility] Progress Note  Subjective: pt more awake, not responding though, extubated this am  Vitals:   01/07/17 1100 01/07/17 1115 01/07/17 1130 01/07/17 1145  BP: (!) 149/76 (!) 143/72 136/73 (!) 152/72  Pulse: 72 73 71 73  Resp: (!) 25 (!) 27 (!) 26 (!) 27  Temp:      TempSrc:      SpO2: 100% 100% 100% 100%  Weight:      Height:        Inpatient medications: . amLODipine  10 mg Oral Daily  . aspirin EC  81 mg Oral Daily  . budesonide (PULMICORT) nebulizer solution  0.5 mg Nebulization BID  . carvedilol  25 mg Oral BID WC  . chlorhexidine gluconate (MEDLINE KIT)  15 mL Mouth Rinse BID  . cinacalcet  60 mg Oral Q supper  . heparin  5,000 Units Subcutaneous Q8H  . insulin aspart  1-3 Units Subcutaneous Q4H  . insulin glargine  10 Units Subcutaneous Daily  . multivitamin  1 tablet Oral Daily  . pantoprazole (PROTONIX) IV  40 mg Intravenous QHS  . rosuvastatin  10 mg Oral Daily   . sodium chloride    . dextrose Stopped (01/06/17 1610)  . levETIRAcetam 500 mg (01/07/17 1204)   sodium chloride, acetaminophen **OR** acetaminophen, dextrose, fentaNYL (SUBLIMAZE) injection, hydrALAZINE, ipratropium-albuterol, labetalol  Exam: Eyes open, tracking, minimal verbalization + JVD Chest dec'd at R base, o/w cleawr RRR no mrg Abd soft ntnd no ascites Ext no LE edema LUA AVF+bruit  Dialysis: NW  TTS 58kg   LUA AVF  Hep 4000 -mircera 225 every 2wks , last 8/23 -hectorol 8 ug tiw      Impression: 1  Acute resp failure - extubated now 2  HTN urgency , improved, on NG meds 3  Status epilepticus - resolved, IV Keppra 4  ESRD TTS 5  Anemia - Hb up no esa 6  DM uncont, better now 7  Poor compliance  Plan - HD today in ICU   Kelly Splinter MD [place] pager [phone]   01/07/2017, 12:36 PM    Recent Labs Lab 01/05/17 1639 01/06/17 0227 01/06/17 1007 01/06/17 1759 01/07/17 0441  NA 136 134*  --   --  134*  K 2.6* 4.7  --   --  3.7   CL 101 97*  --   --  98*  CO2 26 27  --   --  24  GLUCOSE 85 80  --   --  131*  BUN 20 8  --   --  30*  CREATININE 4.50* 2.75*  --   --  4.10*  CALCIUM 8.3* 8.0*  --   --  8.0*  PHOS 3.8 2.9 3.1 4.0 5.4*    Recent Labs Lab 01/05/17 0019 01/05/17 1639 01/06/17 0227  AST 23  --   --   ALT 12*  --   --   ALKPHOS 158*  --   --   BILITOT 0.6  --   --   PROT 8.1  --   --   ALBUMIN 3.9 2.4* 2.7*    Recent Labs Lab 01/05/17 0019  01/05/17 1629 01/06/17 0227 01/07/17 0441  WBC 8.7  < > 4.5 14.8* 14.0*  NEUTROABS 6.4  --   --   --   --   HGB 15.2*  < > 11.9* 14.1 13.1  HCT 48.4*  < > 38.3 44.6 41.2  MCV 92.0  < > 88.5 89.2 90.0  PLT 154  < > 144* 158 135*  < > = values in this interval not displayed. Iron/TIBC/Ferritin/ %Sat    Component Value Date/Time   IRON 30 11/02/2016 0547   TIBC 143 (L) 11/02/2016 0547   FERRITIN 577 (H) 11/02/2016 0547   IRONPCTSAT 21 11/02/2016 0547

## 2017-01-07 NOTE — Progress Notes (Signed)
PULMONARY / CRITICAL CARE MEDICINE   Name: Madeline [name] MRN: [id number] DOB: Jun 20, 1955    ADMISSION DATE:  01/04/2017 CONSULTATION DATE:  8/28  REFERRING MD:  Dr. Blinda Leatherwood  CHIEF COMPLAINT:  seizure  PATIENT DESCRIPTION: 61 yo female former smoker with status epilepticus, HONK, compromised airway. Hx of ESRD, COPD, DM, HTN, CAD.  Reportedly non compliant with therapy.  SUBJECTIVE:  Tolerating pressure support.  VITAL SIGNS: BP (!) 150/73   Pulse 62   Temp 98.8 F (37.1 C) (Axillary)   Resp 17   Ht 5' (1.524 m)   Wt 132 lb 11.5 oz (60.2 kg)   SpO2 100%   BMI 25.92 kg/m   VENTILATOR SETTINGS: Vent Mode: PRVC FiO2 (%):  [30 %] 30 % Set Rate:  [16 bmp] 16 bmp Vt Set:  [360 mL] 360 mL PEEP:  [5 cmH20] 5 cmH20 Pressure Support:  [5 cmH20] 5 cmH20 Plateau Pressure:  [11 cmH20-17 cmH20] 14 cmH20  INTAKE / OUTPUT: I/O last 3 completed shifts: In: 1225.8 [I.V.:880; NG/GT:345.8] Out: 1000 [Other:1000]  PHYSICAL EXAMINATION:  General - alert Eyes - pupils reactive ENT - ETT in place Cardiac - regular, no murmur Chest - no wheeze, rales Abd - soft, non tender Ext - no edema Skin - no rashes Neuro - follows commands, moves all extremities   LABS:  BMET  Recent Labs Lab 01/05/17 1639 01/06/17 0227 01/07/17 0441  NA 136 134* 134*  K 2.6* 4.7 3.7  CL 101 97* 98*  CO2 26 27 24   BUN 20 8 30*  CREATININE 4.50* 2.75* 4.10*  GLUCOSE 85 80 131*    Electrolytes  Recent Labs Lab 01/05/17 1639 01/06/17 0227 01/06/17 1007 01/06/17 1759 01/07/17 0441  CALCIUM 8.3* 8.0*  --   --  8.0*  MG  --   --  1.9 1.8 1.8  PHOS 3.8 2.9 3.1 4.0 5.4*    CBC  Recent Labs Lab 01/05/17 1629 01/06/17 0227 01/07/17 0441  WBC 4.5 14.8* 14.0*  HGB 11.9* 14.1 13.1  HCT 38.3 44.6 41.2  PLT 144* 158 135*    Coag's No results for input(s): APTT, INR in the last 168 hours.  Sepsis Markers  Recent Labs Lab 01/05/17 0032 01/05/17 0319  LATICACIDVEN 2.13* 2.3*     ABG  Recent Labs Lab 01/05/17 0057 01/05/17 0514  PHART 7.300* 7.452*  PCO2ART 68.9* 37.7  PO2ART 456.0* 170.0*    Liver Enzymes  Recent Labs Lab 01/05/17 0019 01/05/17 1639 01/06/17 0227  AST 23  --   --   ALT 12*  --   --   ALKPHOS 158*  --   --   BILITOT 0.6  --   --   ALBUMIN 3.9 2.4* 2.7*    Cardiac Enzymes  Recent Labs Lab 01/05/17 0358 01/05/17 1139 01/05/17 1559  TROPONINI 0.11* 0.18* 0.13*    Glucose  Recent Labs Lab 01/06/17 0741 01/06/17 1129 01/06/17 1521 01/06/17 1932 01/06/17 2323 01/07/17 0329  GLUCAP 137* 114* 170* 84 135* 89    Imaging Mr Brain Wo Contrast  Result Date: 01/06/2017 CLINICAL DATA:  61 y/o F; status epilepticus in the setting of severe hypertension and severe hyperglycemia. EXAM: MRI HEAD WITHOUT CONTRAST TECHNIQUE: Multiplanar, multiecho pulse sequences of the brain and surrounding structures were obtained without intravenous contrast. COMPARISON:  01/05/2017 CT of the head.  10/23/2014 MRI of the head. FINDINGS: Brain: No acute infarction, hemorrhage, hydrocephalus, extra-axial collection or mass lesion. Mild patchy T2 FLAIR hyperintense signal abnormality is present  in periventricular white matter, stable from the prior MRI of the brain, and compatible mild chronic microvascular ischemic changes. Mild brain parenchymal volume loss. Normal signal within the basal ganglia, brainstem, posterior fossa. No abnormal signal of of the hippocampi. Hippocampi by are symmetric in size. No specific findings for PRES or hyperosmolar hyperglycemia. Vascular: Normal flow voids. Skull and upper cervical spine: Normal marrow signal. Sinuses/Orbits: Mild diffuse paranasal sinus mucosal thickening and trace mastoid effusions likely due to intubation. Right intra-ocular lens replacement. Other: None. IMPRESSION: 1. No acute intracranial abnormality. No specific findings for PRES or hyperosmolar hyperglycemia. No MRI findings of encephalitis or  seizure related activity. 2. Stable mild chronic microvascular ischemic changes and parenchymal volume loss of the brain. Electronically Signed   By: Mitzi HansenLance  Furusawa-Stratton M.D.   On: 01/06/2017 16:48   Dg Chest Port 1 View  Result Date: 01/07/2017 CLINICAL DATA:  Seizure EXAM: PORTABLE CHEST 1 VIEW COMPARISON:  01/06/2017 FINDINGS: Endotracheal tube and NG tube remain in place, unchanged. Cardiomegaly. Aortic calcifications. No confluent opacities, effusions or edema. IMPRESSION: Cardiomegaly.  No active disease. Electronically Signed   By: Charlett NoseKevin  Dover M.D.   On: 01/07/2017 07:17    STUDIES:  CT head C-spine 8/28 > Chronic hypertensive microangiopathy without acute intracranial abnormality. No acute fracture or static subluxation of the cervical spine. EEG 8/28 >> slowing over Rt hemisphere, epileptiform activity Lt anterior region MRI brain 8/29 >> chronic microvascular ischemic changes  SIGNIFICANT EVENTS: 8/28 Admit, neuro and renal consulted 8/29 Off sedation  LINES/TUBES: ETT 8/28 >  DISCUSSION: 61 year old female with ESRD, seizures admitted 8/28 with status epilepticus likely due to the combination of profound hyperglycemia and hypertension.   ASSESSMENT / PLAN:  PULMONARY A: Acute hypoxic and hypercapnic respiratory failure. Hx of COPD, OSA. P:   Hopefully extubate soon Scheduled pulmicort Prn duoneb  CARDIOVASCULAR A:  Hypertensive urgency. Hx of CAD, chronic diastolic CHF, HLD. P:  Continue norvasc, coreg, ASA, crestor  RENAL A:   ESRD on HD. P:   HD per renal  GASTROINTESTINAL A:   Nutrition. P:   Advance diet after extubation  HEMATOLOGIC A:  Mild thrombocytopenia. P:  F/u CBC  INFECTIOUS A:   No acute issues. P:   Monitor clinically  ENDOCRINE A:   HHNK. DM type II. P:   SSI with 10 units lantus  NEUROLOGIC A:   Status epilepticus. Hx of CVA. P:   Continue ASA AEDs per neurology  CC time 32 minutes  Coralyn HellingVineet Chara Marquard,  MD [facility] 01/07/2017, 7:42 AM Pager:  [phone] After 3pm call: [phone]

## 2017-01-07 NOTE — Progress Notes (Signed)
Subjective: EEG read pending, but with sedation held, appears to be working on some  Exam: Vitals:   01/07/17 1230 01/07/17 1245  BP: 138/89 (!) 156/71  Pulse: 74 74  Resp: (!) 25 (!) 27  Temp:    SpO2: 100% 100%   Gen: In bed, NAD Resp: non-labored breathing, no acute distress Abd: soft, nt  Neuro: MS: Opens eyes, but does not follow commands CN: Eyes with downward gaze, pupils are reactive bilaterally, face symmetric Motor: she localizes bilaterally Sensory:as above.   Impression: 61 year old female who presented in status epilepticus in the setting of severe hypertension and severe hyperglycemia. I suspect that this is related to her hyperosmolar state, she appears to be improving.  Given that she did have epileptiform activity on her EEG, I would continue Keppra.  Recommendations: 1) continue levetiracetam 750 mg daily + 375mg  mg after dialysis 2) neurology will follow  Ritta SlotMcNeill Donja Tipping, MD [facility] [phone]  If 7pm- 7am, please [place] neurology on call as listed in AMION.

## 2017-01-07 NOTE — Progress Notes (Signed)
Pt.s CBG was 55.  Rn gave 25 of Dextrose 50%.  Rechecked CBG 15 minutes later and CBG was 130.

## 2017-01-08 ENCOUNTER — Encounter (HOSPITAL_COMMUNITY): Payer: Self-pay

## 2017-01-08 ENCOUNTER — Other Ambulatory Visit (HOSPITAL_COMMUNITY): Payer: Self-pay | Admitting: Internal Medicine

## 2017-01-08 ENCOUNTER — Inpatient Hospital Stay (HOSPITAL_COMMUNITY): Payer: Medicaid Other

## 2017-01-08 LAB — BASIC METABOLIC PANEL
ANION GAP: 14 (ref 5–15)
BUN: 16 mg/dL (ref 6–20)
CALCIUM: 8.8 mg/dL — AB (ref 8.9–10.3)
CO2: 21 mmol/L — ABNORMAL LOW (ref 22–32)
Chloride: 102 mmol/L (ref 101–111)
Creatinine, Ser: 3.01 mg/dL — ABNORMAL HIGH (ref 0.44–1.00)
GFR calc Af Amer: 18 mL/min — ABNORMAL LOW (ref >60)
GFR, EST NON AFRICAN AMERICAN: 16 mL/min — AB (ref >60)
GLUCOSE: 108 mg/dL — AB (ref 65–99)
POTASSIUM: 5.5 mmol/L — AB (ref 3.5–5.1)
Sodium: 137 mmol/L (ref 135–145)

## 2017-01-08 LAB — CBC
HCT: 45.9 % (ref 36.0–46.0)
HEMOGLOBIN: 14 g/dL (ref 12.0–15.0)
MCH: 27.7 pg (ref 26.0–34.0)
MCHC: 30.5 g/dL (ref 30.0–36.0)
MCV: 90.9 fL (ref 78.0–100.0)
PLATELETS: 145 10*3/uL — AB (ref 150–400)
RBC: 5.05 MIL/uL (ref 3.87–5.11)
RDW: 16.1 % — ABNORMAL HIGH (ref 11.5–15.5)
WBC: 9.8 10*3/uL (ref 4.0–10.5)

## 2017-01-08 LAB — GLUCOSE, CAPILLARY
GLUCOSE-CAPILLARY: 96 mg/dL (ref 65–99)
GLUCOSE-CAPILLARY: 130 mg/dL — AB (ref 65–99)
GLUCOSE-CAPILLARY: 214 mg/dL — AB (ref 65–99)
GLUCOSE-CAPILLARY: 76 mg/dL (ref 65–99)
Glucose-Capillary: 212 mg/dL — ABNORMAL HIGH (ref 65–99)

## 2017-01-08 MED ORDER — WHITE PETROLATUM GEL
Status: AC
  Filled 2017-01-08: qty 1

## 2017-01-08 MED ORDER — RESOURCE THICKENUP CLEAR PO POWD
ORAL | Status: DC | PRN
  Filled 2017-01-08: qty 125

## 2017-01-08 MED ORDER — LEVETIRACETAM 100 MG/ML PO SOLN
750.0000 mg | Freq: Every day | ORAL | Status: DC
  Administered 2017-01-08 – 2017-01-10 (×3): 750 mg via ORAL
  Filled 2017-01-08: qty 10
  Filled 2017-01-08 (×3): qty 7.5
  Filled 2017-01-08: qty 10

## 2017-01-08 MED ORDER — LEVETIRACETAM 100 MG/ML PO SOLN
380.0000 mg | ORAL | Status: DC
  Administered 2017-01-09: 380 mg via ORAL
  Filled 2017-01-08: qty 5

## 2017-01-08 MED ORDER — LEVETIRACETAM 100 MG/ML PO SOLN: 750.0000 mg | Freq: Every day | ORAL | Status: DC

## 2017-01-08 NOTE — Progress Notes (Signed)
Nutrition Follow-up  DOCUMENTATION CODES:   Not applicable  INTERVENTION:  - Magic cup BID with meals, each supplement provides 290 kcal and 9 grams of protein  NUTRITION DIAGNOSIS:   Increased nutrient needs related to  (ESRD on HD) as evidenced by estimated needs.  Ongoing   GOAL:   Patient will meet greater than or equal to 90% of their needs  Progressing  MONITOR:   PO intake, Supplement acceptance, Weight trends, Diet advancement, Labs, Skin  ASSESSMENT:   Pt with PMH of ESRD on HD TTS, COPD, DM, HTN, CHF, CAD, and medical non-compliance. Several recent admissions related to missing HD, seizures, and HHNK now admitted with the same.   Pt extubated 01/07/17. Pt diet advanced to Dysphagia 2 and honey thick liquids following MBS on 01/08/17.  Discussed pt with RN. Per RN, family reports pt walks at baseline. RN reports pt had a bowel movement 01/08/17 before lunch.   Per nephrology note pt has poor compliance with HD.  Spoke with pt at bedside. Pt reports consuming 3 meals/day, amount varies. Pt reports being very hungry at time of visit. RD called service response center to order pt tray.  Labs reviewed; CBG 55-214, K 5.5, Phosphorus 5.4 Medications reviewed; Sliding scale insulin, Lantus, Rena-vit, Crestor, Protonix  Muscle depletions found to pt's legs only.    Diet Order:  DIET DYS 2 Room service appropriate? Yes; Fluid consistency: Honey Thick  Skin:   (skin tear sacrum)  Last BM:  01/08/17  Height:   Ht Readings from Last 1 Encounters:  01/05/17 5' (1.524 m)    Weight:   Wt Readings from Last 1 Encounters:  01/08/17 135 lb 9.3 oz (61.5 kg)    Ideal Body Weight:  45.4 kg  BMI:  Body mass index is 26.48 kg/m.  Estimated Nutritional Needs:   Kcal:  1535-1735  Protein:  85-100 grams  Fluid:  1.2 L/d  EDUCATION NEEDS:   No education needs identified at this time  Madeline Kaufmannllison Ioannides, MS, RDN, LDN 01/08/2017 3:32 PM

## 2017-01-08 NOTE — Progress Notes (Signed)
PULMONARY / CRITICAL CARE MEDICINE   Name: Madeline [name] MRN: [id number] DOB: 04-20-1956    ADMISSION DATE:  01/04/2017 CONSULTATION DATE:  8/28  REFERRING MD:  Dr. Blinda LeatherwoodPollina  CHIEF COMPLAINT:  seizure  PATIENT DESCRIPTION: 61 yo female former smoker with status epilepticus, HONK, compromised airway. Hx of ESRD, COPD, DM, HTN, CAD.  Reportedly non compliant with therapy.  SUBJECTIVE:  Awake and following commands.Slow to respond to prompts.  VITAL SIGNS: BP (!) 165/75   Pulse 68   Temp 99.8 F (37.7 C) (Oral)   Resp 17   Ht 5' (1.524 m)   Wt 135 lb 9.3 oz (61.5 kg)   SpO2 100%   BMI 26.48 kg/m   VENTILATOR SETTINGS: FiO2 (%):  [30 %] 30 %  INTAKE / OUTPUT: I/O last 3 completed shifts: In: 1179.5 [I.V.:308.7; NG/GT:345.8; IV Piggyback:525] Out: 1150 [Urine:150; Other:1000]  PHYSICAL EXAMINATION:  General - alert, but slow to respond Eyes - pupils equal and  Reactive ENT: Normocephalic/ atraumatic/ No LAD Cardiac - SR per monitor, RRR, no murmur, gallop, rub Chest - no wheeze, rales, coarse throughout  Abd - soft, non tender, non-distended Ext - Right arm with? Infiltrate, tight and swollen Skin - warm , dry and intact, no rashes or lesions Neuro - follows commands, moves all extremities, slow to respond.   LABS:  BMET  Recent Labs Lab 01/06/17 0227 01/07/17 0441 01/08/17 0706  NA 134* 134* 137  K 4.7 3.7 5.5*  CL 97* 98* 102  CO2 27 24 21*  BUN 8 30* 16  CREATININE 2.75* 4.10* 3.01*  GLUCOSE 80 131* 108*    Electrolytes  Recent Labs Lab 01/06/17 0227 01/06/17 1007 01/06/17 1759 01/07/17 0441 01/08/17 0706  CALCIUM 8.0*  --   --  8.0* 8.8*  MG  --  1.9 1.8 1.8  --   PHOS 2.9 3.1 4.0 5.4*  --     CBC  Recent Labs Lab 01/05/17 1629 01/06/17 0227 01/07/17 0441  WBC 4.5 14.8* 14.0*  HGB 11.9* 14.1 13.1  HCT 38.3 44.6 41.2  PLT 144* 158 135*    Coag's No results for input(s): APTT, INR in the last 168 hours.  Sepsis  Markers  Recent Labs Lab 01/05/17 0032 01/05/17 0319  LATICACIDVEN 2.13* 2.3*    ABG  Recent Labs Lab 01/05/17 0057 01/05/17 0514  PHART 7.300* 7.452*  PCO2ART 68.9* 37.7  PO2ART 456.0* 170.0*    Liver Enzymes  Recent Labs Lab 01/05/17 0019 01/05/17 1639 01/06/17 0227  AST 23  --   --   ALT 12*  --   --   ALKPHOS 158*  --   --   BILITOT 0.6  --   --   ALBUMIN 3.9 2.4* 2.7*    Cardiac Enzymes  Recent Labs Lab 01/05/17 0358 01/05/17 1139 01/05/17 1559  TROPONINI 0.11* 0.18* 0.13*    Glucose  Recent Labs Lab 01/07/17 1509 01/07/17 1931 01/07/17 2003 01/07/17 2334 01/08/17 0320 01/08/17 0820  GLUCAP 75 55* 130* 72 96 130*    Imaging No results found.  STUDIES:  CT head C-spine 8/28 > Chronic hypertensive microangiopathy without acute intracranial abnormality. No acute fracture or static subluxation of the cervical spine. EEG 8/28 >> slowing over Rt hemisphere, epileptiform activity Lt anterior region MRI brain 8/29 >> chronic microvascular ischemic changes  SIGNIFICANT EVENTS: 8/28 Admit, neuro and renal consulted 8/29 Off sedation  LINES/TUBES: ETT 8/28 >  DISCUSSION: 61 year old female with ESRD, seizures admitted 8/28 with  status epilepticus likely due to the combination of profound hyperglycemia and hypertension.   ASSESSMENT / PLAN:  PULMONARY A: Acute hypoxic and hypercapnic respiratory failure. Hx of COPD, OSA. P:   Extubated on RA OOB to chair/ mobilize/ IS Scheduled pulmicort Prn duoneb  CARDIOVASCULAR A:  Hypertensive urgency. Hx of CAD, chronic diastolic CHF, HLD. P:  Continue norvasc, coreg, ASA, crestor Telemetry monitoring  RENAL A:   ESRD on HD. Hyperkalemia P:   HD per renal  GASTROINTESTINAL A:   Nutrition. P:   Swallow study Advance diet after extubation  HEMATOLOGIC A:  Mild thrombocytopenia. P:  F/u CBC Monitor for bleeding  INFECTIOUS A:   No acute issues. P:   Monitor  clinically  ENDOCRINE A:   HHNK. DM type II. Hypoglycemia overnight P:   SSI with 10 units lantus CBG Q 4 Consider decreasing Lantus if hypoglycemia continues to be a problem   NEUROLOGIC A:   Status epilepticus. Hx of CVA. P:   Continue ASA AEDs per neurology  Doing well post extubation. Saturations are 96% on RA. She is in no distress. Right hand infiltrate, with restriction for IV in L due to AV fistula. Will convert Keppra to po. Swallow in progress now. If she passes will start diet and monitor blood sugars Q 4. She is going to need a modified swallow to eval for liquids.Will write for IV consult to assess infiltrates. Would prefer to avoid PICC line if possible.As patient is extubated and hemodynamically stable, will transfer back to [facility].   Bevelyn Ngo, AGACNP-BC [facility] 01/08/2017, 9:07 AM Pager: [phone]

## 2017-01-08 NOTE — Progress Notes (Signed)
[facility] Progress Note  Subjective: pt more awake, not responding though, extubated this am  Vitals:   01/08/17 0700 01/08/17 0800 01/08/17 0816 01/08/17 1200  BP: (!) 165/68  (!) 165/75   Pulse: 72  68   Resp: (!) 22  17   Temp:  99.6 F (37.6 C)  98.5 F (36.9 C)  TempSrc:  Axillary  Oral  SpO2: 100%  100%   Weight:      Height:        Inpatient medications: . amLODipine  10 mg Oral Daily  . aspirin EC  81 mg Oral Daily  . budesonide (PULMICORT) nebulizer solution  0.5 mg Nebulization BID  . carvedilol  25 mg Oral BID WC  . chlorhexidine gluconate (MEDLINE KIT)  15 mL Mouth Rinse BID  . cinacalcet  60 mg Oral Q supper  . heparin  5,000 Units Subcutaneous Q8H  . insulin aspart  1-3 Units Subcutaneous Q4H  . insulin glargine  10 Units Subcutaneous Daily  . [START ON 01/09/2017] levETIRAcetam  380 mg Oral Q T,Th,Sat-1800  . levETIRAcetam  750 mg Oral Q1500  . multivitamin  1 tablet Oral Daily  . pantoprazole (PROTONIX) IV  40 mg Intravenous QHS  . rosuvastatin  10 mg Oral Daily   . sodium chloride    . dextrose Stopped (01/08/17 0815)   sodium chloride, acetaminophen **OR** acetaminophen, dextrose, hydrALAZINE, ipratropium-albuterol, labetalol  Exam: Alert, responsive, Ox 3 No jvd Chest dec'd at R base, o/w clear RRR no mrg Abd soft ntnd no ascites Ext no LE edema LUA AVF+bruit  Dialysis: [facility]  TTS 58kg   LUA AVF  Hep 4000 -mircera 225 every 2wks , last 8/23 -hectorol 8 ug tiw      Impression: 1  Acute resp failure - resolved/ extubated 2  HTN urgency - taking po bp meds now, BP's better 3  Status epilepticus/ AMS - TME per neuro, resolving.  Had abnl EEG so neuro wants Keppra x 2 months then f/u EEG to see if changes resolved.  4  ESRD TTS HD 5  Anemia - Hb up no esa 6  DM uncont - better now 7  Poor compliance w HD  Plan - HD tomorrow upstairs.    Kelly Splinter MD [facility] pager [phone]   01/08/2017, 12:24 PM     Recent Labs Lab 01/06/17 0227 01/06/17 1007 01/06/17 1759 01/07/17 0441 01/08/17 0706  NA 134*  --   --  134* 137  K 4.7  --   --  3.7 5.5*  CL 97*  --   --  98* 102  CO2 27  --   --  24 21*  GLUCOSE 80  --   --  131* 108*  BUN 8  --   --  30* 16  CREATININE 2.75*  --   --  4.10* 3.01*  CALCIUM 8.0*  --   --  8.0* 8.8*  PHOS 2.9 3.1 4.0 5.4*  --     Recent Labs Lab 01/05/17 0019 01/05/17 1639 01/06/17 0227  AST 23  --   --   ALT 12*  --   --   ALKPHOS 158*  --   --   BILITOT 0.6  --   --   PROT 8.1  --   --   ALBUMIN 3.9 2.4* 2.7*    Recent Labs Lab 01/05/17 0019  01/06/17 0227 01/07/17 0441 01/08/17 0950  WBC 8.7  < > 14.8* 14.0* 9.8  NEUTROABS 6.4  --   --   --   --   HGB 15.2*  < > 14.1 13.1 14.0  HCT 48.4*  < > 44.6 41.2 45.9  MCV 92.0  < > 89.2 90.0 90.9  PLT 154  < > 158 135* 145*  < > = values in this interval not displayed. Iron/TIBC/Ferritin/ %Sat    Component Value Date/Time   IRON 30 11/02/2016 0547   TIBC 143 (L) 11/02/2016 0547   FERRITIN 577 (H) 11/02/2016 0547   IRONPCTSAT 21 11/02/2016 0547

## 2017-01-08 NOTE — Progress Notes (Signed)
Modified Barium Swallow Progress Note  Patient Details  Name: Madeline [name] MRN: [id number] Date of Birth: May 11, 1956  Today's Date: 01/08/2017  Modified Barium Swallow completed.  Full report located under Chart Review in the Imaging Section.  Brief recommendations include the following:  Clinical Impression  Pt demonstrates deficits following brief intuabtion impacting sensation and airway protection with liquids. Oral function adequate with soft solids, but generalized weakness and fatigue warrant soft, easy to consume solids. Pt has intermittent penetration and aspiration events during the swallow with inconsistent sensation, liely due to decreased glottic closure following intubation. Recommend pt consume honey thick liquids and dys 2 solids. Will f/u for diet tolerance and upgrade as vocal quality and general endurance for meals and activities improve.    Swallow Evaluation Recommendations       SLP Diet Recommendations: Dysphagia 2 (Fine chop) solids;Honey thick liquids   Liquid Administration via: Cup;Spoon   Medication Administration: Whole meds with puree   Supervision: Staff to assist with self feeding   Compensations: Slow rate;Small sips/bites   Postural Changes: Remain semi-upright after after feeds/meals (Comment);Seated upright at 90 degrees   Oral Care Recommendations: Oral care BID       Harlon DittyBonnie Gizell Danser, MA CCC-SLP [phone]  [facility], Riley NearingBonnie Caroline 01/08/2017,2:40 PM

## 2017-01-08 NOTE — Progress Notes (Signed)
Patient scheduled for 1100 Modified swallow study. Will hold all PO meds until study conclusion. IV meds held at this time due to no IV access. Will switch IV to oral if modified swallow study passed.

## 2017-01-08 NOTE — Progress Notes (Signed)
Subjective: EEG read pending, but with sedation held, appears to be working on some  Exam: Vitals:   01/08/17 0800 01/08/17 0816  BP:  (!) 165/75  Pulse:  68  Resp:  17  Temp: 99.6 F (37.6 C)   SpO2:  100%   Gen: In bed, NAD Resp: non-labored breathing, no acute distress Abd: soft, nt  Neuro: MS: Awake, alert, oriented to month, place, but not year. CN: Pupils equal round and reactive to light, visual fields full Motor: She moves all extremities with relatively symmetric strength, but does not give great effort and 8. Sensory:as above.   Impression: 61 year old female who presented in status epilepticus in the setting of severe hypertension and severe hyperglycemia. I suspect that this is related to her hyperosmolar state, she has markedly improved.  Given that she did have epileptiform activity on her EEG, I would continue Keppra for the time being, in a couple months, could consider repeating EEG and discontinuing Keppra at that time if normal.  Recommendations: 1) continue levetiracetam 750 mg daily + 375mg  mg after dialysis 2) I have requested outpatient neurology follow-up 3) no further recommendations at this time, please call with further questions or concerns.  Ritta SlotMcNeill Isabel Ardila, MD [facility] [phone]  If 7pm- 7am, please [place] [facility] on call as listed in AMION.

## 2017-01-08 NOTE — Care Management Note (Signed)
Case Management Note  Patient Details  Name: Madeline [name] MRN: [id number] Date of Birth: 04/14/1956  Subjective/Objective:   Pt admitted on 01/05/17 with status epilepticus and VDRF.  PTA, pt resided at home with family; she is on dialysis Tues/Thurs/Sat, but has hx of noncompliance.                   Action/Plan: Pt extubated this AM.  Recommend PT/OT consults ASAP to determine home needs.  Will follow.    Expected Discharge Date:                  Expected Discharge Plan:     In-House Referral:     Discharge planning Services  CM Consult  Post Acute Care Choice:    Choice offered to:     DME Arranged:    DME Agency:     HH Arranged:    HH Agency:     Status of Service:  In process, will continue to follow  If discussed at Long Length of Stay Meetings, dates discussed:    Additional Comments:  Glennon Macmerson, Michail Boyte M, RN 01/08/2017, 4:27 PM

## 2017-01-08 NOTE — Evaluation (Signed)
Clinical/Bedside Swallow Evaluation Patient Details  Name: Madeline [name] MRN: [id number] Date of Birth: [birth year]  Today's Date: 01/08/2017 Time: SLP Start Time (ACUTE ONLY): 0920 SLP Stop Time (ACUTE ONLY): 0940 SLP Time Calculation (min) (ACUTE ONLY): 20 min  Past Medical History:  Past Medical History:  Diagnosis Date  . Abnormal Doppler ultrasound of carotid artery    a. Per [place] records: <50% LICA.  Marland Kitchen Anasarca    a. Per [place] records - due to pulm HTN with R HF.   Marland Kitchen Aseptic meningitis    a. 09/2012: adm in [place] for metabolic encephalopathy, oliguric tubular necrosis, anemia, HTN, possible CVA, HHNKA.  Marland Kitchen CHF (congestive heart failure) (HCC)    a. HFpEF with RHF/anasarca/pHTN.  . CKD (chronic kidney disease), stage III    a. Per [place] records: h/o ARF after CTA that ruled out PE.  Marland Kitchen COPD (chronic obstructive pulmonary disease) (HCC)   . Coronary artery disease    a. Per [place] records: NSTEMI 01/2012, tx medically given ARF but suspected CAD. b. Stress test 12/16/11 reported w/o ischemia.  Marland Kitchen CVA (cerebral infarction)    a. Per [place] records, "possible CVA" 09/2012 but MRI reportedly negative.  . Diabetes mellitus (HCC)   . Dialysis patient [facility])    Mon, Wed, Fridays  . Hypertension   . OSA (obstructive sleep apnea)    a. Pt reported used to use CPAP in [place], but "ran out" when came to [facility].  Marland Kitchen Pulmonary hypertension (HCC)    a. [facility] 02/28/13: mod pulm HTN with normal PVR suggestive of predominantly pulmonary venous HTN.  Marland Kitchen Renal insufficiency    dialysis 3 days/week  . Right heart failure (HCC)   . Stroke [facility]) 2014   Past Surgical History:  Past Surgical History:  Procedure Laterality Date  . ABDOMINAL HYSTERECTOMY    . AV FISTULA PLACEMENT Left 03/03/2013   Procedure: ARTERIOVENOUS (AV) FISTULA CREATION, Brachial/Cephalic;  Surgeon: Larina Earthly, MD;  Location: [facility] OR;  Service: Vascular;  Laterality: Left;  . ESOPHAGOGASTRODUODENOSCOPY N/A 02/16/2013   Procedure: ESOPHAGOGASTRODUODENOSCOPY  (EGD);  Surgeon: Graylin Shiver, MD;  Location: [facility] ENDOSCOPY;  Service: Endoscopy;  Laterality: N/A;  . INSERTION OF DIALYSIS CATHETER Right 03/03/2013   Procedure: INSERTION OF DIALYSIS CATHETER;  Surgeon: Larina Earthly, MD;  Location: [facility] OR;  Service: Vascular;  Laterality: Right;  Right Internal Jugular Placement  . RIGHT HEART CATHETERIZATION N/A 02/28/2013   Procedure: RIGHT HEART CATH;  Surgeon: Dolores Patty, MD;  Location: [facility] CATH LAB;  Service: Cardiovascular;  Laterality: N/A;  . SHUNTOGRAM N/A 07/13/2013   Procedure: FISTULOGRAM;  Surgeon: Fransisco Hertz, MD;  Location: [facility] CATH LAB;  Service: Cardiovascular;  Laterality: N/A;  . TUBAL LIGATION     HPI:  61 yo female former smoker with status epilepticus, HONK, compromised airway. Hx of ESRD, COPD, DM, HTN, CAD. Reportedly non compliant with therapy. Intubated from  01/05/17 until 01/07/17. MRI negative.    Assessment / Plan / Recommendation Clinical Impression  Pt demonstrates signs of an acute reversible dysphagia following 2 day intubation with finding of hoarse vocal quality and immediate coughing/throat clearing following sips of water. Pt initially tolerated puree at beginning of session, but after trials of water, nectar and honey thick liquids pt demonstrated both immediate and delayed couging, making subjective assessment difficult. Pt needs oral intake due to poor IV access so will proceed with MBS for objective assessment of swallowing. For now pt may have oral meds in puree and ice  chips. Pt, RN and NP in agreement with plan.  SLP Visit Diagnosis: Dysphagia, oropharyngeal phase (R13.12)    Aspiration Risk  Moderate aspiration risk    Diet Recommendation NPO except meds;Ice chips PRN after oral care   Medication Administration: Whole meds with puree Supervision: Full supervision/cueing for compensatory strategies    Other  Recommendations Oral Care Recommendations: Oral care QID   Follow up Recommendations Skilled Nursing  facility      Frequency and Duration            Prognosis        Swallow Study   General HPI: 61 yo female former smoker with status epilepticus, HONK, compromised airway. Hx of ESRD, COPD, DM, HTN, CAD. Reportedly non compliant with therapy. Intubated from  01/05/17 until 01/07/17. MRI negative.  Type of Study: Bedside Swallow Evaluation Previous Swallow Assessment: BSE - WNL in prior admission Diet Prior to this Study: NPO Temperature Spikes Noted: No Respiratory Status: Room air History of Recent Intubation: Yes Length of Intubations (days): 2 days Date extubated: 01/07/17 Behavior/Cognition: Alert;Cooperative Oral Cavity Assessment: Within Functional Limits Oral Care Completed by SLP: No Oral Cavity - Dentition: Poor condition Vision: Functional for self-feeding Self-Feeding Abilities: Total assist (cannot use UE - infiltrated IV/fistula) Patient Positioning: Upright in bed Baseline Vocal Quality: Hoarse;Low vocal intensity Volitional Cough: Strong Volitional Swallow: Able to elicit    Oral/Motor/Sensory Function Overall Oral Motor/Sensory Function: Within functional limits   Ice Chips Ice chips: Within functional limits Presentation: Spoon   Thin Liquid Thin Liquid: Impaired Presentation: Cup Pharyngeal  Phase Impairments: Suspected delayed Swallow;Cough - Immediate;Throat Clearing - Immediate    Nectar Thick Nectar Thick Liquid: Impaired Presentation: Cup Pharyngeal Phase Impairments: Cough - Delayed   Honey Thick Honey Thick Liquid: Impaired Pharyngeal Phase Impairments: Cough - Delayed   Puree Puree: Impaired Pharyngeal Phase Impairments: Cough - Delayed   Solid   GO   Solid: Not tested       Harlon DittyBonnie Gitty Osterlund, MA CCC-SLP [phone]  Cyruss Arata, Riley NearingBonnie Caroline 01/08/2017,9:56 AM

## 2017-01-08 NOTE — Progress Notes (Signed)
Patients right arm found swollen on assessment. Suspected infiltrated IV. IV infusions turned off. Critical care NP, Nephrology MD Arlean HoppingSchertz, and IV team notified. Nephrology MD Arlean HoppingSchertz said ok to stick left hand for lab work. IV team assessing for possible upper right arm IV placement.

## 2017-01-09 DIAGNOSIS — R739 Hyperglycemia, unspecified: Secondary | ICD-10-CM

## 2017-01-09 LAB — GLUCOSE, CAPILLARY
GLUCOSE-CAPILLARY: 140 mg/dL — AB (ref 65–99)
GLUCOSE-CAPILLARY: 255 mg/dL — AB (ref 65–99)
Glucose-Capillary: 153 mg/dL — ABNORMAL HIGH (ref 65–99)
Glucose-Capillary: 177 mg/dL — ABNORMAL HIGH (ref 65–99)
Glucose-Capillary: 193 mg/dL — ABNORMAL HIGH (ref 65–99)
Glucose-Capillary: 204 mg/dL — ABNORMAL HIGH (ref 65–99)
Glucose-Capillary: 274 mg/dL — ABNORMAL HIGH (ref 65–99)

## 2017-01-09 LAB — CBC
HEMATOCRIT: 42.4 % (ref 36.0–46.0)
Hemoglobin: 13.2 g/dL (ref 12.0–15.0)
MCH: 27.6 pg (ref 26.0–34.0)
MCHC: 31.1 g/dL (ref 30.0–36.0)
MCV: 88.7 fL (ref 78.0–100.0)
PLATELETS: 131 10*3/uL — AB (ref 150–400)
RBC: 4.78 MIL/uL (ref 3.87–5.11)
RDW: 15.9 % — AB (ref 11.5–15.5)
WBC: 7.8 10*3/uL (ref 4.0–10.5)

## 2017-01-09 LAB — BASIC METABOLIC PANEL
Anion gap: 13 (ref 5–15)
BUN: 25 mg/dL — ABNORMAL HIGH (ref 6–20)
CALCIUM: 8 mg/dL — AB (ref 8.9–10.3)
CO2: 22 mmol/L (ref 22–32)
CREATININE: 4.27 mg/dL — AB (ref 0.44–1.00)
Chloride: 99 mmol/L — ABNORMAL LOW (ref 101–111)
GFR, EST AFRICAN AMERICAN: 12 mL/min — AB (ref >60)
GFR, EST NON AFRICAN AMERICAN: 10 mL/min — AB (ref >60)
GLUCOSE: 169 mg/dL — AB (ref 65–99)
Potassium: 3.9 mmol/L (ref 3.5–5.1)
Sodium: 134 mmol/L — ABNORMAL LOW (ref 135–145)

## 2017-01-09 MED ORDER — SODIUM CHLORIDE 0.9 % IV SOLN: 100.0000 mL | INTRAVENOUS | Status: DC | PRN

## 2017-01-09 MED ORDER — INSULIN ASPART 100 UNIT/ML ~~LOC~~ SOLN
2.0000 [IU] | Freq: Once | SUBCUTANEOUS | Status: AC
  Administered 2017-01-09: 2 [IU] via SUBCUTANEOUS

## 2017-01-09 MED ORDER — LIDOCAINE-PRILOCAINE 2.5-2.5 % EX CREA
1.0000 "application " | TOPICAL_CREAM | CUTANEOUS | Status: DC | PRN
  Filled 2017-01-09: qty 5

## 2017-01-09 MED ORDER — PANTOPRAZOLE SODIUM 40 MG PO TBEC
40.0000 mg | DELAYED_RELEASE_TABLET | Freq: Every day | ORAL | Status: DC
  Administered 2017-01-09 – 2017-01-10 (×2): 40 mg via ORAL
  Filled 2017-01-09 (×2): qty 1

## 2017-01-09 MED ORDER — LIDOCAINE HCL (PF) 1 % IJ SOLN: 5.0000 mL | INTRAMUSCULAR | Status: DC | PRN

## 2017-01-09 MED ORDER — HEPARIN SODIUM (PORCINE) 1000 UNIT/ML DIALYSIS: 4000.0000 [IU] | Freq: Once | INTRAMUSCULAR | Status: DC

## 2017-01-09 MED ORDER — PENTAFLUOROPROP-TETRAFLUOROETH EX AERO: 1.0000 "application " | INHALATION_SPRAY | CUTANEOUS | Status: DC | PRN

## 2017-01-09 MED ORDER — ALTEPLASE 2 MG IJ SOLR: 2.0000 mg | Freq: Once | INTRAMUSCULAR | Status: DC | PRN

## 2017-01-09 MED ORDER — HEPARIN SODIUM (PORCINE) 1000 UNIT/ML DIALYSIS: 1000.0000 [IU] | INTRAMUSCULAR | Status: DC | PRN

## 2017-01-09 NOTE — Progress Notes (Signed)
PHARMACIST - PHYSICIAN COMMUNICATION  CONCERNING: IV to Oral Route Change Policy  RECOMMENDATION: This patient is receiving Protonix by the intravenous route.  Based on criteria approved by the Pharmacy and Therapeutics Committee, the intravenous medication(s) is/are being converted to the equivalent oral dose form(s).   DESCRIPTION: These criteria include:  The patient is eating (either orally or via tube) and/or has been taking other orally administered medications for a least 24 hours  The patient has no evidence of active gastrointestinal bleeding or impaired GI absorption (gastrectomy, short bowel, patient on TNA or NPO).  If you have questions about this conversion, please contact the Pharmacy Department  []   [phone]( [phone] )  [facility] []   [phone]( [phone] )  [facility] [x]   [phone]( [phone]  [facility] []   [phone]( [phone]  [facility] []   [phone]( [phone]  [facility]   Sallee Provencalurner, Rees Santistevan S, [facility] 01/09/2017 8:29 AM

## 2017-01-09 NOTE — Progress Notes (Signed)
[facility] Progress Note  Subjective: pt awake and responsive  Vitals:   01/09/17 0830 01/09/17 0900 01/09/17 0930 01/09/17 1000  BP: (!) 160/58 (!) 157/67 (!) 133/49 (!) 158/66  Pulse: (!) 59 62 60 60  Resp: (!) 24 (!) 23 20 (!) 22  Temp:      TempSrc:      SpO2:      Weight:      Height:        Inpatient medications: . amLODipine  10 mg Oral Daily  . aspirin EC  81 mg Oral Daily  . budesonide (PULMICORT) nebulizer solution  0.5 mg Nebulization BID  . carvedilol  25 mg Oral BID WC  . chlorhexidine gluconate (MEDLINE KIT)  15 mL Mouth Rinse BID  . cinacalcet  60 mg Oral Q supper  . heparin  5,000 Units Subcutaneous Q8H  . insulin aspart  1-3 Units Subcutaneous Q4H  . insulin glargine  10 Units Subcutaneous Daily  . levETIRAcetam  380 mg Oral Q T,Th,Sat-1800  . levETIRAcetam  750 mg Oral Q1500  . multivitamin  1 tablet Oral Daily  . pantoprazole  40 mg Oral Daily  . rosuvastatin  10 mg Oral Daily   . sodium chloride    . dextrose 40 mL/hr at 01/09/17 0504   sodium chloride, acetaminophen **OR** acetaminophen, dextrose, hydrALAZINE, ipratropium-albuterol, labetalol, RESOURCE THICKENUP CLEAR  Exam: Alert, responsive, Ox 3, flat affect No jvd Chest dec'd BS bilat, no rales or wheezing RRR no mrg Abd soft ntnd no ascites Ext no LE edema LUA AVF+bruit  Dialysis: [facility]  TTS 58kg   LUA AVF  Hep 4000 -mircera 225 every 2wks , last 8/23 -hectorol 8 ug tiw      Impression: 1  Acute resp failure - due to #3 , resolved/ extubated 2  HTN urgency - taking po bp meds now, BP's better 3  Status epilepticus/ AMS - TME per neuro, resolving.  Had abnl EEG so neuro wants Keppra x 2 months then f/u EEG to see if changes resolved.  4  ESRD TTS HD, missed HD w/ uremia, resolving 5  Anemia - Hb up no esa 6  DM uncont - better now 7  Vol is at dry wt  Plan - HD today   Kelly Splinter MD [facility] pager [phone]   01/09/2017, 11:32 AM    Recent  Labs Lab 01/06/17 1007 01/06/17 1759 01/07/17 0441 01/08/17 0706 01/09/17 0531  NA  --   --  134* 137 134*  K  --   --  3.7 5.5* 3.9  CL  --   --  98* 102 99*  CO2  --   --  24 21* 22  GLUCOSE  --   --  131* 108* 169*  BUN  --   --  30* 16 25*  CREATININE  --   --  4.10* 3.01* 4.27*  CALCIUM  --   --  8.0* 8.8* 8.0*  PHOS 3.1 4.0 5.4*  --   --     Recent Labs Lab 01/05/17 0019 01/05/17 1639 01/06/17 0227  AST 23  --   --   ALT 12*  --   --   ALKPHOS 158*  --   --   BILITOT 0.6  --   --   PROT 8.1  --   --   ALBUMIN 3.9 2.4* 2.7*    Recent Labs Lab 01/05/17 0019  01/07/17 0441 01/08/17 0950 01/09/17 0531  WBC  8.7  < > 14.0* 9.8 7.8  NEUTROABS 6.4  --   --   --   --   HGB 15.2*  < > 13.1 14.0 13.2  HCT 48.4*  < > 41.2 45.9 42.4  MCV 92.0  < > 90.0 90.9 88.7  PLT 154  < > 135* 145* 131*  < > = values in this interval not displayed. Iron/TIBC/Ferritin/ %Sat    Component Value Date/Time   IRON 30 11/02/2016 0547   TIBC 143 (L) 11/02/2016 0547   FERRITIN 577 (H) 11/02/2016 0547   IRONPCTSAT 21 11/02/2016 0547

## 2017-01-09 NOTE — Progress Notes (Signed)
Pt's blood glucose checked. BG 274. Dr Antionette Charpyd notified. 3 units insulin given. Reported event to oncoming nurse. Will recheck Blood glucose and adjust insulin admin time as needed.

## 2017-01-09 NOTE — Progress Notes (Signed)
PROGRESS NOTE    Madeline [name]  UYQ:[id number] DOB: [birth year] DOA: 01/04/2017 PCP: Madeline Sacramento, MD    Brief Narrative:  61 yo female former smoker with status epilepticus, HONK, compromised airway. Hx of ESRD, COPD, DM, HTN, CAD.  Reportedly non compliant with therapy.   Assessment & Plan:   Active Problems:   Acute respiratory failure with hypercapnia (HCC) - resolved, pt breathing in room air comfortably    ESRD on hemodialysis [facility]) - nephrology managing    Acute encephalopathy - resolving    Status epilepticus [facility]) - Nephrology on board and managing.   DVT prophylaxis: Heparin Code Status: Full Family Communication: none at bedside. Disposition Plan: once cleared by specialist. Possibly next 1-2 days   Consultants:   Nephrology  neurology   Procedures: HD   Antimicrobials: none   Subjective: Pt has no new complaints.   Objective: Vitals:   01/09/17 1030 01/09/17 1100 01/09/17 1130 01/09/17 1155  BP: (!) 145/73 107/64 (!) 102/31 (!) 153/66  Pulse: 63 62 61 62  Resp: (!) 22 (!) 22 (!) 21 (!) 21  Temp:    98.4 F (36.9 C)  TempSrc:    Oral  SpO2:    95%  Weight:    55.4 kg (122 lb 2.2 oz)  Height:        Intake/Output Summary (Last 24 hours) at 01/09/17 1706 Last data filed at 01/09/17 1155  Gross per 24 hour  Intake                0 ml  Output             3500 ml  Net            -3500 ml   Filed Weights   01/09/17 0416 01/09/17 0748 01/09/17 1155  Weight: 61.9 kg (136 lb 7.4 oz) 58.9 kg (129 lb 13.6 oz) 55.4 kg (122 lb 2.2 oz)    Examination:  General exam: Appears calm and comfortable  Respiratory system: Clear to auscultation. Respiratory effort normal. Cardiovascular system: S1 & S2 heard, RRR. No JVD, murmurs, rubs Gastrointestinal system: Abdomen is nondistended, soft and nontender. No organomegaly or masses felt. Normal bowel sounds heard. Central nervous system: Alert and oriented. No focal neurological  deficits. Extremities: Symmetric 5 x 5 power. Skin: No rashes, lesions or ulcers, on limited exam. Psychiatry:  Mood & affect appropriate.     Data Reviewed: I have personally reviewed following labs and imaging studies  CBC:  Recent Labs Lab 01/05/17 0019  01/05/17 1629 01/06/17 0227 01/07/17 0441 01/08/17 0950 01/09/17 0531  WBC 8.7  < > 4.5 14.8* 14.0* 9.8 7.8  NEUTROABS 6.4  --   --   --   --   --   --   HGB 15.2*  < > 11.9* 14.1 13.1 14.0 13.2  HCT 48.4*  < > 38.3 44.6 41.2 45.9 42.4  MCV 92.0  < > 88.5 89.2 90.0 90.9 88.7  PLT 154  < > 144* 158 135* 145* 131*  < > = values in this interval not displayed. Basic Metabolic Panel:  Recent Labs Lab 01/05/17 0358 01/05/17 1639 01/06/17 0227 01/06/17 1007 01/06/17 1759 01/07/17 0441 01/08/17 0706 01/09/17 0531  NA 130* 136 134*  --   --  134* 137 134*  K 3.1* 2.6* 4.7  --   --  3.7 5.5* 3.9  CL 93* 101 97*  --   --  98* 102 99*  CO2 23 26 27   --   --  24 21* 22  GLUCOSE 662* 85 80  --   --  131* 108* 169*  BUN 26* 20 8  --   --  30* 16 25*  CREATININE 5.23* 4.50* 2.75*  --   --  4.10* 3.01* 4.27*  CALCIUM 8.1* 8.3* 8.0*  --   --  8.0* 8.8* 8.0*  MG 2.1  --   --  1.9 1.8 1.8  --   --   PHOS 5.2* 3.8 2.9 3.1 4.0 5.4*  --   --    GFR: Estimated Creatinine Clearance: 10.1 mL/min (A) (by C-G formula based on SCr of 4.27 mg/dL (H)). Liver Function Tests:  Recent Labs Lab 01/05/17 0019 01/05/17 1639 01/06/17 0227  AST 23  --   --   ALT 12*  --   --   ALKPHOS 158*  --   --   BILITOT 0.6  --   --   PROT 8.1  --   --   ALBUMIN 3.9 2.4* 2.7*   No results for input(s): LIPASE, AMYLASE in the last 168 hours.  Recent Labs Lab 01/05/17 0319  AMMONIA 31   Coagulation Profile: No results for input(s): INR, PROTIME in the last 168 hours. Cardiac Enzymes:  Recent Labs Lab 01/05/17 0358 01/05/17 1139 01/05/17 1559  TROPONINI 0.11* 0.18* 0.13*   BNP (last 3 results) No results for input(s): PROBNP in the  last 8760 hours. HbA1C: No results for input(s): HGBA1C in the last 72 hours. CBG:  Recent Labs Lab 01/08/17 2352 01/09/17 0349 01/09/17 0744 01/09/17 1238 01/09/17 1600  GLUCAP 153* 193* 177* 140* 255*   Lipid Profile: No results for input(s): CHOL, HDL, LDLCALC, TRIG, CHOLHDL, LDLDIRECT in the last 72 hours. Thyroid Function Tests: No results for input(s): TSH, T4TOTAL, FREET4, T3FREE, THYROIDAB in the last 72 hours. Anemia Panel: No results for input(s): VITAMINB12, FOLATE, FERRITIN, TIBC, IRON, RETICCTPCT in the last 72 hours. Sepsis Labs:  Recent Labs Lab 01/05/17 0032 01/05/17 0319  LATICACIDVEN 2.13* 2.3*    No results found for this or any previous visit (from the past 240 hour(s)).       Radiology Studies: Dg Swallowing Func-speech Pathology  Result Date: 01/08/2017 Objective Swallowing Evaluation: Type of Study: MBS-Modified Barium Swallow Study Patient Details Name: Madeline [name] MRN: [id number] Date of Birth: 1955-12-07 Today's Date: 01/08/2017 Time: SLP Start Time (ACUTE ONLY): 1115-SLP Stop Time (ACUTE ONLY): 1140 SLP Time Calculation (min) (ACUTE ONLY): 25 min Past Medical History: Past Medical History: Diagnosis Date . Abnormal Doppler ultrasound of carotid artery   a. Per [place] records: <78% LICA. Marland Kitchen Anasarca   a. Per [place] records - due to pulm HTN with R HF.  Marland Kitchen Aseptic meningitis   a. 09/2012: adm in [place] for metabolic encephalopathy, oliguric tubular necrosis, anemia, HTN, possible CVA, HHNKA. Marland Kitchen CHF (congestive heart failure) ([place])   a. HFpEF with RHF/anasarca/pHTN. . CKD (chronic kidney disease), stage III   a. Per [place] records: h/o ARF after CTA that ruled out PE. Marland Kitchen COPD (chronic obstructive pulmonary disease) ([place])  . Coronary artery disease   a. Per [place] records: NSTEMI 01/2012, tx medically given ARF but suspected CAD. b. Stress test 12/16/11 reported w/o ischemia. Marland Kitchen CVA (cerebral infarction)   a. Per [place] records, "possible CVA" 09/2012 but MRI reportedly negative. . Diabetes  mellitus ([place])  . Dialysis patient [facility])   Mon, Wed, Fridays . Hypertension  . OSA (obstructive sleep apnea)   a. Pt reported used to use CPAP in [place],  but "ran out" when came to [facility] Marland Kitchen Pulmonary hypertension ([place])   a. [place] 02/28/13: mod pulm HTN with normal PVR suggestive of predominantly pulmonary venous HTN. Marland Kitchen Renal insufficiency   dialysis 3 days/week . Right heart failure ([place])  . Stroke [facility]) 2014 Past Surgical History: Past Surgical History: Procedure Laterality Date . ABDOMINAL HYSTERECTOMY   . AV FISTULA PLACEMENT Left 03/03/2013  Procedure: ARTERIOVENOUS (AV) FISTULA CREATION, Brachial/Cephalic;  Surgeon: Rosetta Posner, MD;  Location: [place];  Service: Vascular;  Laterality: Left; . ESOPHAGOGASTRODUODENOSCOPY N/A 02/16/2013  Procedure: ESOPHAGOGASTRODUODENOSCOPY (EGD);  Surgeon: Wonda Horner, MD;  Location[facility] ENDOSCOPY;  Service: Endoscopy;  Laterality: N/A; . INSERTION OF DIALYSIS CATHETER Right 03/03/2013  Procedure: INSERTION OF DIALYSIS CATHETER;  Surgeon: Rosetta Posner, MD;  Location: [place];  Service: Vascular;  Laterality: Right;  Right Internal Jugular Placement . RIGHT HEART CATHETERIZATION N/A 02/28/2013  Procedure: RIGHT HEART CATH;  Surgeon: Jolaine Artist, MD;  Location: [facility] CATH LAB;  Service: Cardiovascular;  Laterality: N/A; . SHUNTOGRAM N/A 07/13/2013  Procedure: FISTULOGRAM;  Surgeon: Conrad Teutopolis, MD;  Location: [facility] CATH LAB;  Service: Cardiovascular;  Laterality: N/A; . TUBAL LIGATION   HPI: 61 yo female former smoker with status epilepticus, HONK, compromised airway. Hx of ESRD, COPD, DM, HTN, CAD. Reportedly non compliant with therapy. Intubated from  01/05/17 until 01/07/17. MRI negative.  No Data Recorded Assessment / Plan / Recommendation CHL IP CLINICAL IMPRESSIONS 01/08/2017 Clinical Impression Pt demonstrates deficits following brief intuabtion impacting sensation and airway protection with liquids. Oral function adequate with soft solids, but generalized weakness and fatigue warrants  soft, easy to consume solids. Pt has intermittent penetration and aspiration events during the swallow with inconsistent sensation, liely due to decreased gltotic closure following intubation. Recommend pt consume honey thick liquids and dys 2 solids. Will f/u for diet tolerance and upgrade as vocal quality and general endurance for meals and activities improve.  SLP Visit Diagnosis Dysphagia, oropharyngeal phase (R13.12) Attention and concentration deficit following -- Frontal lobe and executive function deficit following -- Impact on safety and function Moderate aspiration risk   CHL IP TREATMENT RECOMMENDATION 01/08/2017 Treatment Recommendations Therapy as outlined in treatment plan below   Prognosis 01/08/2017 Prognosis for Safe Diet Advancement Good Barriers to Reach Goals -- Barriers/Prognosis Comment -- CHL IP DIET RECOMMENDATION 01/08/2017 SLP Diet Recommendations Dysphagia 2 (Fine chop) solids;Honey thick liquids Liquid Administration via Cup;Spoon Medication Administration Whole meds with puree Compensations Slow rate;Small sips/bites Postural Changes Remain semi-upright after after feeds/meals (Comment);Seated upright at 90 degrees   CHL IP OTHER RECOMMENDATIONS 01/08/2017 Recommended Consults -- Oral Care Recommendations Oral care BID Other Recommendations --   CHL IP FOLLOW UP RECOMMENDATIONS 01/08/2017 Follow up Recommendations Skilled Nursing facility   [facility] IP FREQUENCY AND DURATION 01/08/2017 Speech Therapy Frequency (ACUTE ONLY) min 2x/week Treatment Duration 2 weeks      CHL IP ORAL PHASE 01/08/2017 Oral Phase WFL Oral - Pudding Teaspoon -- Oral - Pudding Cup -- Oral - Honey Teaspoon -- Oral - Honey Cup -- Oral - Nectar Teaspoon -- Oral - Nectar Cup -- Oral - Nectar Straw -- Oral - Thin Teaspoon -- Oral - Thin Cup -- Oral - Thin Straw -- Oral - Puree -- Oral - Mech Soft -- Oral - Regular -- Oral - Multi-Consistency -- Oral - Pill -- Oral Phase - Comment --  CHL IP PHARYNGEAL PHASE 01/08/2017 Pharyngeal  Phase Impaired Pharyngeal- Pudding Teaspoon -- Pharyngeal -- Pharyngeal- Pudding Cup -- Pharyngeal --  Pharyngeal- Honey Teaspoon WFL Pharyngeal -- Pharyngeal- Honey Cup -- Pharyngeal -- Pharyngeal- Nectar Teaspoon -- Pharyngeal -- Pharyngeal- Nectar Cup Penetration/Aspiration during swallow;Reduced airway/laryngeal closure Pharyngeal Material enters airway, CONTACTS cords and not ejected out Pharyngeal- Nectar Straw -- Pharyngeal -- Pharyngeal- Thin Teaspoon -- Pharyngeal -- Pharyngeal- Thin Cup Penetration/Aspiration during swallow;Reduced airway/laryngeal closure Pharyngeal Material enters airway, passes BELOW cords without attempt by patient to eject out (silent aspiration);Material enters airway, passes BELOW cords and not ejected out despite cough attempt by patient Pharyngeal- Thin Straw -- Pharyngeal -- Pharyngeal- Puree WFL Pharyngeal -- Pharyngeal- Mechanical Soft WFL Pharyngeal -- Pharyngeal- Regular -- Pharyngeal -- Pharyngeal- Multi-consistency -- Pharyngeal -- Pharyngeal- Pill -- Pharyngeal -- Pharyngeal Comment --  No flowsheet data found. No flowsheet data found. DeBlois, Katherene Ponto 01/08/2017, 2:46 PM                   Scheduled Meds: . amLODipine  10 mg Oral Daily  . aspirin EC  81 mg Oral Daily  . budesonide (PULMICORT) nebulizer solution  0.5 mg Nebulization BID  . carvedilol  25 mg Oral BID WC  . chlorhexidine gluconate (MEDLINE KIT)  15 mL Mouth Rinse BID  . cinacalcet  60 mg Oral Q supper  . heparin  4,000 Units Dialysis Once in dialysis  . heparin  5,000 Units Subcutaneous Q8H  . insulin aspart  1-3 Units Subcutaneous Q4H  . insulin glargine  10 Units Subcutaneous Daily  . levETIRAcetam  380 mg Oral Q T,Th,Sat-1800  . levETIRAcetam  750 mg Oral Q1500  . multivitamin  1 tablet Oral Daily  . pantoprazole  40 mg Oral Daily  . rosuvastatin  10 mg Oral Daily   Continuous Infusions: . sodium chloride    . sodium chloride    . sodium chloride    . dextrose 40 mL/hr at  01/09/17 0504     LOS: 4 days    Time spent: > 35 minutes  Velvet Bathe, MD [facility] Pager [phone]  If 7PM-7AM, please contact night-coverage [email or web address] Password [email or web address] 01/09/2017, 5:06 PM

## 2017-01-10 LAB — GLUCOSE, CAPILLARY
GLUCOSE-CAPILLARY: 101 mg/dL — AB (ref 65–99)
GLUCOSE-CAPILLARY: 128 mg/dL — AB (ref 65–99)
GLUCOSE-CAPILLARY: 248 mg/dL — AB (ref 65–99)
GLUCOSE-CAPILLARY: 72 mg/dL (ref 65–99)
Glucose-Capillary: 398 mg/dL — ABNORMAL HIGH (ref 65–99)

## 2017-01-10 MED ORDER — LEVETIRACETAM 100 MG/ML PO SOLN: 750.0000 mg | Freq: Every day | ORAL | 0 refills | Status: DC

## 2017-01-10 MED ORDER — INSULIN GLARGINE 100 UNIT/ML ~~LOC~~ SOLN: 10.0000 [IU] | Freq: Every day | SUBCUTANEOUS | 0 refills | Status: AC

## 2017-01-10 MED ORDER — LEVETIRACETAM 100 MG/ML PO SOLN: 380.0000 mg | ORAL | 0 refills | Status: DC

## 2017-01-10 MED ORDER — SERTRALINE HCL 50 MG PO TABS: 50.0000 mg | ORAL_TABLET | Freq: Every day | ORAL | 0 refills | Status: AC

## 2017-01-10 MED ORDER — INSULIN ASPART 100 UNIT/ML ~~LOC~~ SOLN
15.0000 [IU] | Freq: Once | SUBCUTANEOUS | Status: AC
  Administered 2017-01-10: 15 [IU] via SUBCUTANEOUS

## 2017-01-10 NOTE — Progress Notes (Signed)
Order to discharge home. Pt A&Ox3. Telemetry and IV removed. Pt educated on staying to have blood sugar rechecked after giving 15 units. Daughter made aware of blood sugar and receiving 15 units of insulin. Pt verbalized understanding and stated "I would like to go home". Discharge instructions read and given to pt. All questions answered. Pt verbalizes understanding. NT transported pt off unit via wheelchair to be transported home by daughter [name].

## 2017-01-10 NOTE — Progress Notes (Signed)
[facility] Progress Note  Subjective: pt awake and responsive  Vitals:   01/10/17 0427 01/10/17 0744 01/10/17 0827 01/10/17 1045  BP:   (!) 145/67 (!) 146/75  Pulse:   (!) 58   Resp:   (!) 22   Temp: 98.3 F (36.8 C)  99.3 F (37.4 C)   TempSrc: Oral  Oral   SpO2:  99% 99%   Weight: 56.7 kg (125 lb)     Height:        Inpatient medications: . amLODipine  10 mg Oral Daily  . aspirin EC  81 mg Oral Daily  . budesonide (PULMICORT) nebulizer solution  0.5 mg Nebulization BID  . carvedilol  25 mg Oral BID WC  . chlorhexidine gluconate (MEDLINE KIT)  15 mL Mouth Rinse BID  . cinacalcet  60 mg Oral Q supper  . heparin  4,000 Units Dialysis Once in dialysis  . heparin  5,000 Units Subcutaneous Q8H  . insulin aspart  1-3 Units Subcutaneous Q4H  . insulin glargine  10 Units Subcutaneous Daily  . levETIRAcetam  380 mg Oral Q T,Th,Sat-1800  . levETIRAcetam  750 mg Oral Q1500  . multivitamin  1 tablet Oral Daily  . pantoprazole  40 mg Oral Daily  . rosuvastatin  10 mg Oral Daily   . sodium chloride    . sodium chloride    . sodium chloride    . dextrose 40 mL/hr at 01/09/17 0504   sodium chloride, sodium chloride, sodium chloride, acetaminophen **OR** acetaminophen, alteplase, dextrose, heparin, hydrALAZINE, ipratropium-albuterol, labetalol, lidocaine (PF), lidocaine-prilocaine, pentafluoroprop-tetrafluoroeth, RESOURCE THICKENUP CLEAR  Exam: Alert, responsive, Ox 3, flat affect No jvd Chest dec'd BS bilat, no rales or wheezing RRR no mrg Abd soft ntnd no ascites Ext no LE edema LUA AVF+bruit  Dialysis: [facility]  TTS 58kg   LUA AVF  Hep 4000 -mircera 225 every 2wks , last 8/23 -hectorol 8 ug tiw      Impression: 1  Acute resp failure - due to #3 , resolved/ extubated 2  HTN urgency - taking po bp meds now, BP's better 3  Status epilepticus/ AMS - TME per neuro, resolving.  Had abnl EEG so neuro wants Keppra x 2 months then f/u EEG to see if changes resolved.   4  ESRD TTS HD. Missing HD.  Talked w/ daughter who says patient goes through periods of depression and refuses to go to HD when this happens.  She won't take depression medicine if they tell her to, but maybe if we suggest it that would help.  Have d/w primary MD about start antidepressant.  Talked w pt and she is agreeable.  5  Anemia - Hb up no esa 6  DM uncont - better now 7  Vol is at dry wt 8  Probable depression - see above 9  DIspo - ok for dc from renal standpoint  Plan - as above   Kelly Splinter MD [place] pager [phone]   01/10/2017, 10:47 AM    Recent Labs Lab 01/06/17 1007 01/06/17 1759 01/07/17 0441 01/08/17 0706 01/09/17 0531  NA  --   --  134* 137 134*  K  --   --  3.7 5.5* 3.9  CL  --   --  98* 102 99*  CO2  --   --  24 21* 22  GLUCOSE  --   --  131* 108* 169*  BUN  --   --  30* 16 25*  CREATININE  --   --  4.10* 3.01* 4.27*  CALCIUM  --   --  8.0* 8.8* 8.0*  PHOS 3.1 4.0 5.4*  --   --     Recent Labs Lab 01/05/17 0019 01/05/17 1639 01/06/17 0227  AST 23  --   --   ALT 12*  --   --   ALKPHOS 158*  --   --   BILITOT 0.6  --   --   PROT 8.1  --   --   ALBUMIN 3.9 2.4* 2.7*    Recent Labs Lab 01/05/17 0019  01/07/17 0441 01/08/17 0950 01/09/17 0531  WBC 8.7  < > 14.0* 9.8 7.8  NEUTROABS 6.4  --   --   --   --   HGB 15.2*  < > 13.1 14.0 13.2  HCT 48.4*  < > 41.2 45.9 42.4  MCV 92.0  < > 90.0 90.9 88.7  PLT 154  < > 135* 145* 131*  < > = values in this interval not displayed. Iron/TIBC/Ferritin/ %Sat    Component Value Date/Time   IRON 30 11/02/2016 0547   TIBC 143 (L) 11/02/2016 0547   FERRITIN 577 (H) 11/02/2016 0547   IRONPCTSAT 21 11/02/2016 0547

## 2017-01-10 NOTE — Progress Notes (Signed)
Notified Dr. Cena BentonVega of pt CBG 398. Orders given to give 15 units. Order to discharge home.

## 2017-01-10 NOTE — Discharge Summary (Signed)
Physician Discharge Summary  Madeline [name] ZOX:[id number] DOB: 03-Oct-1955 DOA: 01/04/2017  PCP: Barbie BannerWilson, [name], MD  Admit date: 01/04/2017 Discharge date: 01/10/2017  Time spent: > 35 minutes  Recommendations for Outpatient Follow-up:  1. Ensure complaince with medication regimen 2. Started on sertraline 50 mg po daily   Discharge Diagnoses:  Active Problems:   Acute respiratory failure with hypercapnia (HCC)   ESRD on hemodialysis (HCC)   Acute encephalopathy   Hyperosmolar non-ketotic state in patient with type 2 diabetes mellitus (HCC)   Status epilepticus [facility](HCC)   Discharge Condition: stable  Diet recommendation: dysphagia 2  Filed Weights   01/09/17 0748 01/09/17 1155 01/10/17 0427  Weight: 58.9 kg (129 lb 13.6 oz) 55.4 kg (122 lb 2.2 oz) 56.7 kg (125 lb)    History of present illness:  61 yo female former smoker with status epilepticus, HONK, compromised airway. Hx of ESRD, COPD, DM, HTN, CAD. Reportedly non compliant with therapy.  Hospital Course:  Active Problems:   Acute respiratory failure with hypercapnia (HCC) - resolved, pt breathing in room air comfortably    ESRD on hemodialysis [facility](HCC) - nephrology will continue to manage as outpatient. Patient reports symptoms suspicious for depression and as such sometimes will not go to her treatments (HD). As such will start on sertraline 50 mg po daily.    Acute encephalopathy - resolved    Status epilepticus [facility](HCC) - Nephrology recommended Keppra. Will continue on discharge   Procedures:  HD  Consultations:  Nephrology  Neurology  Discharge Exam: Vitals:   01/10/17 1045 01/10/17 1208  BP: (!) 146/75 140/69  Pulse:  65  Resp:  16  Temp:  98.3 F (36.8 C)  SpO2:  96%    General: Pt in nad, alert and awake Cardiovascular: rrr, no rubs Respiratory: no increased wob, no wheezes  Discharge Instructions   Discharge Instructions    Ambulatory referral to Neurology    Complete by:  As directed     An appointment is requested in approximately: 4 weeks   Call MD for:  redness, tenderness, or signs of infection (pain, swelling, redness, odor or green/yellow discharge around incision site)    Complete by:  As directed    Call MD for:  temperature >100.4    Complete by:  As directed    Diet - low sodium heart healthy    Complete by:  As directed    Increase activity slowly    Complete by:  As directed      Current Discharge Medication List    START taking these medications   Details  !! levETIRAcetam (KEPPRA) 100 MG/ML solution Take 3.8 mLs (380 mg total) by mouth every Tuesday, Thursday, and Saturday at 6 PM. Qty: 473 mL, Refills: 0    !! levETIRAcetam (KEPPRA) 100 MG/ML solution Take 7.5 mLs (750 mg total) by mouth daily at 3 pm. Qty: 473 mL, Refills: 0    sertraline (ZOLOFT) 50 MG tablet Take 1 tablet (50 mg total) by mouth daily. Qty: 30 tablet, Refills: 0     !! - Potential duplicate medications found. Please discuss with provider.    CONTINUE these medications which have CHANGED   Details  insulin glargine (LANTUS) 100 UNIT/ML injection Inject 0.1 mLs (10 Units total) into the skin daily. Qty: 10 mL, Refills: 0      CONTINUE these medications which have NOT CHANGED   Details  amLODipine (NORVASC) 10 MG tablet Take 10 mg by mouth daily.     carvedilol (  COREG) 25 MG tablet Take 25 mg by mouth 2 (two) times daily with a meal.    cinacalcet (SENSIPAR) 30 MG tablet Take 2 tablets (60 mg total) by mouth daily with supper. Qty: 60 tablet, Refills: 0    insulin lispro (HUMALOG) 100 UNIT/ML injection Inject 3-10 Units into the skin 3 (three) times daily before meals. Per sliding scale: "BGL 200 = 3 units; 250 = 5 units; 300 = 7 units; 350 = 9-10 units"    metoCLOPramide (REGLAN) 5 MG tablet Take 1 tablet (5 mg total) by mouth 4 (four) times daily -  before meals and at bedtime. Qty: 90 tablet, Refills: 0    mirtazapine (REMERON) 15 MG tablet Take 15 mg by mouth at  bedtime.    multivitamin (RENA-VIT) TABS tablet Take 1 tablet by mouth at bedtime. Qty: 30 tablet, Refills: 0    nitroGLYCERIN (NITROSTAT) 0.4 MG SL tablet PLACE ONE TABLET UNDER THE TONGUE EVERY FIVE MINUTES AS NEEDED FOR CHEST PAIN Qty: 30 tablet, Refills: 3    pantoprazole (PROTONIX) 40 MG tablet Take 1 tablet (40 mg total) by mouth 2 (two) times daily. Qty: 60 tablet, Refills: 1    rosuvastatin (CRESTOR) 10 MG tablet Take 1 tablet (10 mg total) by mouth daily. Qty: 30 tablet, Refills: 11    sevelamer (RENAGEL) 800 MG tablet Take 1,600 mg by mouth 3 (three) times daily with meals.    traMADol (ULTRAM) 50 MG tablet Take 50 mg by mouth 3 (three) times daily as needed for moderate pain.     acetaminophen (TYLENOL) 500 MG tablet Take 500-1,000 mg by mouth every 6 (six) hours as needed (for pain).     albuterol (PROVENTIL HFA;VENTOLIN HFA) 108 (90 BASE) MCG/ACT inhaler Inhale 2 puffs into the lungs every 6 (six) hours as needed for wheezing or shortness of breath.     aspirin EC 81 MG tablet Take 81 mg by mouth daily.    ranitidine (ZANTAC) 150 MG tablet Take 150 mg by mouth daily as needed for heartburn.      STOP taking these medications     polyethylene glycol (MIRALAX / GLYCOLAX) packet      promethazine (PHENERGAN) 25 MG tablet        No Known Allergies    The results of significant diagnostics from this hospitalization (including imaging, microbiology, ancillary and laboratory) are listed below for reference.    Significant Diagnostic Studies: Dg Chest 2 View  Result Date: 12/24/2016 CLINICAL DATA:  Dialysis.  Cough. EXAM: CHEST  2 VIEW COMPARISON:  10/30/2016. FINDINGS: Cardiomegaly with mild pulmonary vascular prominence and bilateral interstitial prominence consistent with mild CHF. No pleural effusion or pneumothorax. IMPRESSION: Findings consistent with congestive heart failure with mild interstitial edema. Electronically Signed   By: Maisie Fus  Register   On:  12/24/2016 12:35   Ct Head Wo Contrast  Result Date: 01/05/2017 CLINICAL DATA:  Found down and unresponsive. EXAM: CT HEAD WITHOUT CONTRAST CT CERVICAL SPINE WITHOUT CONTRAST TECHNIQUE: Multidetector CT imaging of the head and cervical spine was performed following the standard protocol without intravenous contrast. Multiplanar CT image reconstructions of the cervical spine were also generated. COMPARISON:  Head CT 12/24/2016 FINDINGS: CT HEAD FINDINGS Brain: No mass lesion, intraparenchymal hemorrhage or extra-axial collection. No evidence of acute cortical infarct. There is periventricular hypoattenuation compatible with chronic microvascular disease. Vascular: Atherosclerotic calcification of the vertebral and internal carotid arteries at the skull base. Skull: Normal visualized skull base, calvarium and extracranial soft tissues. Sinuses/Orbits: No  sinus fluid levels or advanced mucosal thickening. No mastoid effusion. Normal orbits. CT CERVICAL SPINE FINDINGS Alignment: No static subluxation. Facets are aligned. Occipital condyles are normally positioned. Skull base and vertebrae: No acute fracture. Soft tissues and spinal canal: No prevertebral fluid or swelling. No visible canal hematoma. Disc levels:  Disc space narrowing and endplate remodeling at C6-C7. Upper chest: Incompletely evaluated left upper lobe opacity, likely scarring. Other: Normal visualized paraspinal cervical soft tissues. IMPRESSION: 1. Chronic hypertensive microangiopathy without acute intracranial abnormality. 2. No acute fracture or static subluxation of the cervical spine. Electronically Signed   By: Deatra Robinson M.D.   On: 01/05/2017 02:58   Ct Head Wo Contrast  Result Date: 12/24/2016 CLINICAL DATA:  Seizure with headache EXAM: CT HEAD WITHOUT CONTRAST TECHNIQUE: Contiguous axial images were obtained from the base of the skull through the vertex without intravenous contrast. COMPARISON:  October 29, 2016 FINDINGS: Brain: The  ventricles are normal in size and configuration. There is no intracranial mass, hemorrhage, extra-axial fluid collection, or midline shift. There is mild patchy small vessel disease in the centra semiovale bilaterally. Elsewhere gray-white compartments appear normal. There is no evident acute infarct. Vascular: No hyperdense vessel. There is calcification in each carotid siphon region. There is also calcification in each distal vertebral artery. Skull: Bony calvarium appears intact. Sinuses/Orbits: There is mucosal thickening in several ethmoid air cells bilaterally. Other visualized paranasal sinuses are clear. Visualized orbits appear symmetric bilaterally except for questionable cataract removal on the right. Other: Mastoid air cells are clear. IMPRESSION: Patchy periventricular small vessel disease, essentially stable. No intracranial mass, hemorrhage, or extra-axial fluid collection. No evident acute infarct. Multiple foci of arterial vascular calcification noted. Mucosal thickening in several ethmoid air cells bilaterally. Electronically Signed   By: Bretta Bang III M.D.   On: 12/24/2016 12:10   Ct Cervical Spine Wo Contrast  Result Date: 01/05/2017 CLINICAL DATA:  Found down and unresponsive. EXAM: CT HEAD WITHOUT CONTRAST CT CERVICAL SPINE WITHOUT CONTRAST TECHNIQUE: Multidetector CT imaging of the head and cervical spine was performed following the standard protocol without intravenous contrast. Multiplanar CT image reconstructions of the cervical spine were also generated. COMPARISON:  Head CT 12/24/2016 FINDINGS: CT HEAD FINDINGS Brain: No mass lesion, intraparenchymal hemorrhage or extra-axial collection. No evidence of acute cortical infarct. There is periventricular hypoattenuation compatible with chronic microvascular disease. Vascular: Atherosclerotic calcification of the vertebral and internal carotid arteries at the skull base. Skull: Normal visualized skull base, calvarium and  extracranial soft tissues. Sinuses/Orbits: No sinus fluid levels or advanced mucosal thickening. No mastoid effusion. Normal orbits. CT CERVICAL SPINE FINDINGS Alignment: No static subluxation. Facets are aligned. Occipital condyles are normally positioned. Skull base and vertebrae: No acute fracture. Soft tissues and spinal canal: No prevertebral fluid or swelling. No visible canal hematoma. Disc levels:  Disc space narrowing and endplate remodeling at C6-C7. Upper chest: Incompletely evaluated left upper lobe opacity, likely scarring. Other: Normal visualized paraspinal cervical soft tissues. IMPRESSION: 1. Chronic hypertensive microangiopathy without acute intracranial abnormality. 2. No acute fracture or static subluxation of the cervical spine. Electronically Signed   By: Deatra Robinson M.D.   On: 01/05/2017 02:58   Mr Brain Wo Contrast  Result Date: 01/06/2017 CLINICAL DATA:  61 y/o F; status epilepticus in the setting of severe hypertension and severe hyperglycemia. EXAM: MRI HEAD WITHOUT CONTRAST TECHNIQUE: Multiplanar, multiecho pulse sequences of the brain and surrounding structures were obtained without intravenous contrast. COMPARISON:  01/05/2017 CT of the head.  10/23/2014 MRI  of the head. FINDINGS: Brain: No acute infarction, hemorrhage, hydrocephalus, extra-axial collection or mass lesion. Mild patchy T2 FLAIR hyperintense signal abnormality is present in periventricular white matter, stable from the prior MRI of the brain, and compatible mild chronic microvascular ischemic changes. Mild brain parenchymal volume loss. Normal signal within the basal ganglia, brainstem, posterior fossa. No abnormal signal of of the hippocampi. Hippocampi by are symmetric in size. No specific findings for PRES or hyperosmolar hyperglycemia. Vascular: Normal flow voids. Skull and upper cervical spine: Normal marrow signal. Sinuses/Orbits: Mild diffuse paranasal sinus mucosal thickening and trace mastoid effusions  likely due to intubation. Right intra-ocular lens replacement. Other: None. IMPRESSION: 1. No acute intracranial abnormality. No specific findings for PRES or hyperosmolar hyperglycemia. No MRI findings of encephalitis or seizure related activity. 2. Stable mild chronic microvascular ischemic changes and parenchymal volume loss of the brain. Electronically Signed   By: Mitzi Hansen M.D.   On: 01/06/2017 16:48   Dg Chest Port 1 View  Result Date: 01/07/2017 CLINICAL DATA:  Seizure EXAM: PORTABLE CHEST 1 VIEW COMPARISON:  01/06/2017 FINDINGS: Endotracheal tube and NG tube remain in place, unchanged. Cardiomegaly. Aortic calcifications. No confluent opacities, effusions or edema. IMPRESSION: Cardiomegaly.  No active disease. Electronically Signed   By: Charlett Nose M.D.   On: 01/07/2017 07:17   Dg Chest Port 1 View  Result Date: 01/06/2017 CLINICAL DATA:  Respiratory failure, seizure EXAM: PORTABLE CHEST 1 VIEW COMPARISON:  01/05/2017 FINDINGS: Endotracheal tube and NG tube remain in place, unchanged. Heart is borderline in size. Left base atelectasis. Right lung is clear. No effusions. IMPRESSION: Left base atelectasis.  Borderline cardiomegaly. Electronically Signed   By: Charlett Nose M.D.   On: 01/06/2017 07:24   Dg Chest Port 1 View  Result Date: 01/05/2017 CLINICAL DATA:  Seizure, unresponsive EXAM: PORTABLE CHEST 1 VIEW COMPARISON:  12/24/2016 FINDINGS: Endotracheal tube tip is suspected to be at the carina. Esophageal tube tip is below the diaphragm. Right lung is clear. Borderline heart size with atherosclerosis. Patchy atelectasis at the left base. No pneumothorax. IMPRESSION: 1. Endotracheal tube tip appears at the carina 2. Borderline cardiomegaly. Patchy atelectasis at the left lung base. Electronically Signed   By: Jasmine Pang M.D.   On: 01/05/2017 02:14   Dg Abd Portable 1 View  Result Date: 01/05/2017 CLINICAL DATA:  OG tube placement EXAM: PORTABLE ABDOMEN - 1 VIEW  COMPARISON:  CT 10/29/2016 FINDINGS: Esophageal tube tip and side port overlie the mid stomach. Patchy atelectasis or infiltrate at the left base. Nonobstructed gas pattern. Small calcification to the left of L4. IMPRESSION: Esophageal tube tip overlies the mid stomach. Electronically Signed   By: Jasmine Pang M.D.   On: 01/05/2017 02:17   Dg Swallowing Func-speech Pathology  Result Date: 01/08/2017 Objective Swallowing Evaluation: Type of Study: MBS-Modified Barium Swallow Study Patient Details Name: Shya Kovatch MRN: [id number] Date of Birth: 01-Feb-1956 Today's Date: 01/08/2017 Time: SLP Start Time (ACUTE ONLY): 1115-SLP Stop Time (ACUTE ONLY): 1140 SLP Time Calculation (min) (ACUTE ONLY): 25 min Past Medical History: Past Medical History: Diagnosis Date . Abnormal Doppler ultrasound of carotid artery   a. Per [place] records: <50% LICA. Marland Kitchen Anasarca   a. Per [place] records - due to pulm HTN with R HF.  Marland Kitchen Aseptic meningitis   a. 09/2012: adm in [place] for metabolic encephalopathy, oliguric tubular necrosis, anemia, HTN, possible CVA, HHNKA. Marland Kitchen CHF (congestive heart failure) (HCC)   a. HFpEF with RHF/anasarca/pHTN. . CKD (chronic kidney disease), stage III  a. Per [place] records: h/o ARF after CTA that ruled out PE. Marland Kitchen COPD (chronic obstructive pulmonary disease) (HCC)  . Coronary artery disease   a. Per [place] records: NSTEMI 01/2012, tx medically given ARF but suspected CAD. b. Stress test 12/16/11 reported w/o ischemia. Marland Kitchen CVA (cerebral infarction)   a. Per [place] records, "possible CVA" 09/2012 but MRI reportedly negative. . Diabetes mellitus (HCC)  . Dialysis patient [facility])   Mon, Wed, Fridays . Hypertension  . OSA (obstructive sleep apnea)   a. Pt reported used to use CPAP in [place], but "ran out" when came to [facility]. Marland Kitchen Pulmonary hypertension (HCC)   a. RHC 02/28/13: mod pulm HTN with normal PVR suggestive of predominantly pulmonary venous HTN. Marland Kitchen Renal insufficiency   dialysis 3 days/week . Right heart failure (HCC)  . Stroke [facility]) 2014 Past Surgical  History: Past Surgical History: Procedure Laterality Date . ABDOMINAL HYSTERECTOMY   . AV FISTULA PLACEMENT Left 03/03/2013  Procedure: ARTERIOVENOUS (AV) FISTULA CREATION, Brachial/Cephalic;  Surgeon: Larina Earthly, MD;  Location: [facility] OR;  Service: Vascular;  Laterality: Left; . ESOPHAGOGASTRODUODENOSCOPY N/A 02/16/2013  Procedure: ESOPHAGOGASTRODUODENOSCOPY (EGD);  Surgeon: Graylin Shiver, MD;  Location[facility] ENDOSCOPY;  Service: Endoscopy;  Laterality: N/A; . INSERTION OF DIALYSIS CATHETER Right 03/03/2013  Procedure: INSERTION OF DIALYSIS CATHETER;  Surgeon: Larina Earthly, MD;  Location: [facility] OR;  Service: Vascular;  Laterality: Right;  Right Internal Jugular Placement . RIGHT HEART CATHETERIZATION N/A 02/28/2013  Procedure: RIGHT HEART CATH;  Surgeon: Dolores Patty, MD;  Location[facility] C[facility] LAB;  Service: Cardiovascular;  Laterality: N/A; . SHUNTOGRAM N/A 07/13/2013  Procedure: FISTULOGRAM;  Surgeon: Fransisco Hertz, MD;  Location: [facility] CATH LAB;  Service: Cardiovascular;  Laterality: N/A; . TUBAL LIGATION   HPI: 61 yo female former smoker with status epilepticus, HONK, compromised airway. Hx of ESRD, COPD, DM, HTN, CAD. Reportedly non compliant with therapy. Intubated from  01/05/17 until 01/07/17. MRI negative.  No Data Recorded Assessment / Plan / Recommendation CHL IP CLINICAL IMPRESSIONS 01/08/2017 Clinical Impression Pt demonstrates deficits following brief intuabtion impacting sensation and airway protection with liquids. Oral function adequate with soft solids, but generalized weakness and fatigue warrants soft, easy to consume solids. Pt has intermittent penetration and aspiration events during the swallow with inconsistent sensation, liely due to decreased gltotic closure following intubation. Recommend pt consume honey thick liquids and dys 2 solids. Will f/u for diet tolerance and upgrade as vocal quality and general endurance for meals and activities improve.  SLP Visit Diagnosis Dysphagia, oropharyngeal phase  (R13.12) Attention and concentration deficit following -- Frontal lobe and executive function deficit following -- Impact on safety and function Moderate aspiration risk   CHL IP TREATMENT RECOMMENDATION 01/08/2017 Treatment Recommendations Therapy as outlined in treatment plan below   Prognosis 01/08/2017 Prognosis for Safe Diet Advancement Good Barriers to Reach Goals -- Barriers/Prognosis Comment -- CHL IP DIET RECOMMENDATION 01/08/2017 SLP Diet Recommendations Dysphagia 2 (Fine chop) solids;Honey thick liquids Liquid Administration via Cup;Spoon Medication Administration Whole meds with puree Compensations Slow rate;Small sips/bites Postural Changes Remain semi-upright after after feeds/meals (Comment);Seated upright at 90 degrees   CHL IP OTHER RECOMMENDATIONS 01/08/2017 Recommended Consults -- Oral Care Recommendations Oral care BID Other Recommendations --   CHL IP FOLLOW UP RECOMMENDATIONS 01/08/2017 Follow up Recommendations Skilled Nursing facility   [facility] IP FREQUENCY AND DURATION 01/08/2017 Speech Therapy Frequency (ACUTE ONLY) min 2x/week Treatment Duration 2 weeks      CHL IP ORAL PHASE 01/08/2017 Oral Phase WFL Oral -  Pudding Teaspoon -- Oral - Pudding Cup -- Oral - Honey Teaspoon -- Oral - Honey Cup -- Oral - Nectar Teaspoon -- Oral - Nectar Cup -- Oral - Nectar Straw -- Oral - Thin Teaspoon -- Oral - Thin Cup -- Oral - Thin Straw -- Oral - Puree -- Oral - Mech Soft -- Oral - Regular -- Oral - Multi-Consistency -- Oral - Pill -- Oral Phase - Comment --  CHL IP PHARYNGEAL PHASE 01/08/2017 Pharyngeal Phase Impaired Pharyngeal- Pudding Teaspoon -- Pharyngeal -- Pharyngeal- Pudding Cup -- Pharyngeal -- Pharyngeal- Honey Teaspoon WFL Pharyngeal -- Pharyngeal- Honey Cup -- Pharyngeal -- Pharyngeal- Nectar Teaspoon -- Pharyngeal -- Pharyngeal- Nectar Cup Penetration/Aspiration during swallow;Reduced airway/laryngeal closure Pharyngeal Material enters airway, CONTACTS cords and not ejected out Pharyngeal- Nectar Straw  -- Pharyngeal -- Pharyngeal- Thin Teaspoon -- Pharyngeal -- Pharyngeal- Thin Cup Penetration/Aspiration during swallow;Reduced airway/laryngeal closure Pharyngeal Material enters airway, passes BELOW cords without attempt by patient to eject out (silent aspiration);Material enters airway, passes BELOW cords and not ejected out despite cough attempt by patient Pharyngeal- Thin Straw -- Pharyngeal -- Pharyngeal- Puree WFL Pharyngeal -- Pharyngeal- Mechanical Soft WFL Pharyngeal -- Pharyngeal- Regular -- Pharyngeal -- Pharyngeal- Multi-consistency -- Pharyngeal -- Pharyngeal- Pill -- Pharyngeal -- Pharyngeal Comment --  No flowsheet data found. No flowsheet data found. DeBlois, Riley Nearing 01/08/2017, 2:46 PM               Microbiology: No results found for this or any previous visit (from the past 240 hour(s)).   Labs: Basic Metabolic Panel:  Recent Labs Lab 01/05/17 0358 01/05/17 1639 01/06/17 0227 01/06/17 1007 01/06/17 1759 01/07/17 0441 01/08/17 0706 01/09/17 0531  NA 130* 136 134*  --   --  134* 137 134*  K 3.1* 2.6* 4.7  --   --  3.7 5.5* 3.9  CL 93* 101 97*  --   --  98* 102 99*  CO2 23 26 27   --   --  24 21* 22  GLUCOSE 662* 85 80  --   --  131* 108* 169*  BUN 26* 20 8  --   --  30* 16 25*  CREATININE 5.23* 4.50* 2.75*  --   --  4.10* 3.01* 4.27*  CALCIUM 8.1* 8.3* 8.0*  --   --  8.0* 8.8* 8.0*  MG 2.1  --   --  1.9 1.8 1.8  --   --   PHOS 5.2* 3.8 2.9 3.1 4.0 5.4*  --   --    Liver Function Tests:  Recent Labs Lab 01/05/17 0019 01/05/17 1639 01/06/17 0227  AST 23  --   --   ALT 12*  --   --   ALKPHOS 158*  --   --   BILITOT 0.6  --   --   PROT 8.1  --   --   ALBUMIN 3.9 2.4* 2.7*   No results for input(s): LIPASE, AMYLASE in the last 168 hours.  Recent Labs Lab 01/05/17 0319  AMMONIA 31   CBC:  Recent Labs Lab 01/05/17 0019  01/05/17 1629 01/06/17 0227 01/07/17 0441 01/08/17 0950 01/09/17 0531  WBC 8.7  < > 4.5 14.8* 14.0* 9.8 7.8  NEUTROABS 6.4   --   --   --   --   --   --   HGB 15.2*  < > 11.9* 14.1 13.1 14.0 13.2  HCT 48.4*  < > 38.3 44.6 41.2 45.9 42.4  MCV 92.0  < > 88.5  89.2 90.0 90.9 88.7  PLT 154  < > 144* 158 135* 145* 131*  < > = values in this interval not displayed. Cardiac Enzymes:  Recent Labs Lab 01/05/17 0358 01/05/17 1139 01/05/17 1559  TROPONINI 0.11* 0.18* 0.13*   BNP: BNP (last 3 results) No results for input(s): BNP in the last 8760 hours.  ProBNP (last 3 results) No results for input(s): PROBNP in the last 8760 hours.  CBG:  Recent Labs Lab 01/09/17 1817 01/09/17 1951 01/10/17 0014 01/10/17 0419 01/10/17 0830  GLUCAP 274* 204* 72 101* 128*   Signed:  Penny Pia MD.  [facility] 01/10/2017, 2:39 PM

## 2017-01-25 ENCOUNTER — Encounter (HOSPITAL_COMMUNITY): Payer: Self-pay

## 2017-01-25 ENCOUNTER — Emergency Department (HOSPITAL_COMMUNITY)
Admission: EM | Admit: 2017-01-25 | Discharge: 2017-01-25 | Disposition: A | Discharge status: Home / Self Care | Payer: Medicaid Other | Attending: Emergency Medicine | Admitting: Emergency Medicine

## 2017-01-25 DIAGNOSIS — R0602 Shortness of breath: Secondary | ICD-10-CM | POA: Insufficient documentation

## 2017-01-25 DIAGNOSIS — R062 Wheezing: Secondary | ICD-10-CM | POA: Insufficient documentation

## 2017-01-25 DIAGNOSIS — Z5321 Procedure and treatment not carried out due to patient leaving prior to being seen by health care provider: Secondary | ICD-10-CM | POA: Diagnosis not present

## 2017-01-25 MED ORDER — ALBUTEROL SULFATE (2.5 MG/3ML) 0.083% IN NEBU
5.0000 mg | INHALATION_SOLUTION | Freq: Once | RESPIRATORY_TRACT | Status: AC
  Administered 2017-01-25: 5 mg via RESPIRATORY_TRACT
  Filled 2017-01-25: qty 6

## 2017-01-25 NOTE — ED Notes (Signed)
Bed: WA24 Expected date:  Expected time:  Means of arrival:  Comments: Hold to Catheterize Dexter C

## 2017-01-25 NOTE — ED Notes (Signed)
Patient breathing easier after breathing treatment given. Writer had patient wait in the triage room vs the ED waiting room for comfort. Patient's daughter came to visit the patient and was upset because her mother had not been taken to an ED room for treatment by a physician. Explained to patient and patient's daughter that a room ws not available and breathing rtretment had been given to help the patient breathe better. Patient's daughter stated, "I am a [employer] fund[employer], but this is ridiculous.

## 2017-01-25 NOTE — ED Notes (Signed)
Bed: WA08 Expected date:  Expected time:  Means of arrival:  Comments: EMS 

## 2017-01-25 NOTE — ED Triage Notes (Signed)
Patient went to PCP today for c/o SOB. Patient told patient to come to the ED for a breathing treatment. Patient reports a non productive cough x 3 days.

## 2017-01-27 ENCOUNTER — Other Ambulatory Visit: Payer: Self-pay | Admitting: Family Medicine

## 2017-01-27 ENCOUNTER — Ambulatory Visit
Admission: RE | Admit: 2017-01-27 | Discharge: 2017-01-27 | Disposition: A | Discharge status: Home / Self Care | Payer: Medicaid Other | Source: Ambulatory Visit | Attending: Family Medicine | Admitting: Family Medicine

## 2017-01-27 DIAGNOSIS — J441 Chronic obstructive pulmonary disease with (acute) exacerbation: Secondary | ICD-10-CM

## 2017-02-05 ENCOUNTER — Emergency Department (HOSPITAL_COMMUNITY): Payer: Medicaid Other

## 2017-02-05 ENCOUNTER — Encounter (HOSPITAL_COMMUNITY): Payer: Self-pay

## 2017-02-05 ENCOUNTER — Observation Stay (HOSPITAL_COMMUNITY)
Admission: EM | Admit: 2017-02-05 | Discharge: 2017-02-06 | Discharge status: Against Medical Advice | Payer: Medicaid Other | Attending: Family Medicine | Admitting: Family Medicine

## 2017-02-05 DIAGNOSIS — D631 Anemia in chronic kidney disease: Secondary | ICD-10-CM | POA: Insufficient documentation

## 2017-02-05 DIAGNOSIS — G9340 Encephalopathy, unspecified: Secondary | ICD-10-CM | POA: Diagnosis present

## 2017-02-05 DIAGNOSIS — G4733 Obstructive sleep apnea (adult) (pediatric): Secondary | ICD-10-CM | POA: Diagnosis not present

## 2017-02-05 DIAGNOSIS — E162 Hypoglycemia, unspecified: Secondary | ICD-10-CM | POA: Diagnosis present

## 2017-02-05 DIAGNOSIS — I2510 Atherosclerotic heart disease of native coronary artery without angina pectoris: Secondary | ICD-10-CM | POA: Insufficient documentation

## 2017-02-05 DIAGNOSIS — R569 Unspecified convulsions: Secondary | ICD-10-CM

## 2017-02-05 DIAGNOSIS — E1122 Type 2 diabetes mellitus with diabetic chronic kidney disease: Secondary | ICD-10-CM | POA: Insufficient documentation

## 2017-02-05 DIAGNOSIS — G40909 Epilepsy, unspecified, not intractable, without status epilepticus: Secondary | ICD-10-CM | POA: Insufficient documentation

## 2017-02-05 DIAGNOSIS — I132 Hypertensive heart and chronic kidney disease with heart failure and with stage 5 chronic kidney disease, or end stage renal disease: Secondary | ICD-10-CM | POA: Insufficient documentation

## 2017-02-05 DIAGNOSIS — E11649 Type 2 diabetes mellitus with hypoglycemia without coma: Secondary | ICD-10-CM | POA: Diagnosis not present

## 2017-02-05 DIAGNOSIS — R9431 Abnormal electrocardiogram [ECG] [EKG]: Secondary | ICD-10-CM | POA: Diagnosis not present

## 2017-02-05 DIAGNOSIS — R05 Cough: Secondary | ICD-10-CM | POA: Insufficient documentation

## 2017-02-05 DIAGNOSIS — Z7982 Long term (current) use of aspirin: Secondary | ICD-10-CM | POA: Insufficient documentation

## 2017-02-05 DIAGNOSIS — Z9981 Dependence on supplemental oxygen: Secondary | ICD-10-CM | POA: Diagnosis not present

## 2017-02-05 DIAGNOSIS — R680 Hypothermia, not associated with low environmental temperature: Secondary | ICD-10-CM | POA: Diagnosis present

## 2017-02-05 DIAGNOSIS — N186 End stage renal disease: Secondary | ICD-10-CM | POA: Diagnosis not present

## 2017-02-05 DIAGNOSIS — I272 Pulmonary hypertension, unspecified: Secondary | ICD-10-CM | POA: Insufficient documentation

## 2017-02-05 DIAGNOSIS — I5032 Chronic diastolic (congestive) heart failure: Secondary | ICD-10-CM | POA: Diagnosis not present

## 2017-02-05 DIAGNOSIS — N189 Chronic kidney disease, unspecified: Secondary | ICD-10-CM | POA: Diagnosis present

## 2017-02-05 DIAGNOSIS — Z992 Dependence on renal dialysis: Secondary | ICD-10-CM

## 2017-02-05 DIAGNOSIS — E889 Metabolic disorder, unspecified: Secondary | ICD-10-CM | POA: Insufficient documentation

## 2017-02-05 DIAGNOSIS — Z90710 Acquired absence of both cervix and uterus: Secondary | ICD-10-CM | POA: Insufficient documentation

## 2017-02-05 DIAGNOSIS — IMO0002 Reserved for concepts with insufficient information to code with codable children: Secondary | ICD-10-CM | POA: Diagnosis present

## 2017-02-05 DIAGNOSIS — Z833 Family history of diabetes mellitus: Secondary | ICD-10-CM | POA: Insufficient documentation

## 2017-02-05 DIAGNOSIS — Z8673 Personal history of transient ischemic attack (TIA), and cerebral infarction without residual deficits: Secondary | ICD-10-CM | POA: Insufficient documentation

## 2017-02-05 DIAGNOSIS — J449 Chronic obstructive pulmonary disease, unspecified: Secondary | ICD-10-CM | POA: Diagnosis not present

## 2017-02-05 DIAGNOSIS — I10 Essential (primary) hypertension: Secondary | ICD-10-CM | POA: Diagnosis present

## 2017-02-05 DIAGNOSIS — Z794 Long term (current) use of insulin: Secondary | ICD-10-CM | POA: Insufficient documentation

## 2017-02-05 DIAGNOSIS — I252 Old myocardial infarction: Secondary | ICD-10-CM | POA: Diagnosis not present

## 2017-02-05 DIAGNOSIS — T68XXXA Hypothermia, initial encounter: Secondary | ICD-10-CM | POA: Diagnosis present

## 2017-02-05 DIAGNOSIS — Z8249 Family history of ischemic heart disease and other diseases of the circulatory system: Secondary | ICD-10-CM | POA: Insufficient documentation

## 2017-02-05 DIAGNOSIS — Z79899 Other long term (current) drug therapy: Secondary | ICD-10-CM | POA: Insufficient documentation

## 2017-02-05 DIAGNOSIS — E875 Hyperkalemia: Secondary | ICD-10-CM | POA: Diagnosis not present

## 2017-02-05 DIAGNOSIS — Z720 Tobacco use: Secondary | ICD-10-CM | POA: Insufficient documentation

## 2017-02-05 DIAGNOSIS — Z9889 Other specified postprocedural states: Secondary | ICD-10-CM | POA: Insufficient documentation

## 2017-02-05 DIAGNOSIS — B182 Chronic viral hepatitis C: Secondary | ICD-10-CM | POA: Diagnosis present

## 2017-02-05 DIAGNOSIS — I120 Hypertensive chronic kidney disease with stage 5 chronic kidney disease or end stage renal disease: Secondary | ICD-10-CM | POA: Diagnosis present

## 2017-02-05 DIAGNOSIS — N2581 Secondary hyperparathyroidism of renal origin: Secondary | ICD-10-CM | POA: Diagnosis present

## 2017-02-05 DIAGNOSIS — E1165 Type 2 diabetes mellitus with hyperglycemia: Secondary | ICD-10-CM | POA: Diagnosis present

## 2017-02-05 DIAGNOSIS — I50810 Right heart failure, unspecified: Secondary | ICD-10-CM | POA: Insufficient documentation

## 2017-02-05 LAB — COMPREHENSIVE METABOLIC PANEL
ALK PHOS: 119 U/L (ref 38–126)
ALT: 28 U/L (ref 14–54)
AST: 33 U/L (ref 15–41)
Albumin: 2.2 g/dL — ABNORMAL LOW (ref 3.5–5.0)
Anion gap: 13 (ref 5–15)
BUN: 39 mg/dL — AB (ref 6–20)
CALCIUM: 8 mg/dL — AB (ref 8.9–10.3)
CHLORIDE: 102 mmol/L (ref 101–111)
CO2: 21 mmol/L — AB (ref 22–32)
Creatinine, Ser: 3.69 mg/dL — ABNORMAL HIGH (ref 0.44–1.00)
GFR calc Af Amer: 14 mL/min — ABNORMAL LOW (ref >60)
GFR calc non Af Amer: 12 mL/min — ABNORMAL LOW (ref >60)
GLUCOSE: 257 mg/dL — AB (ref 65–99)
Potassium: 5.2 mmol/L — ABNORMAL HIGH (ref 3.5–5.1)
SODIUM: 136 mmol/L (ref 135–145)
Total Bilirubin: 0.6 mg/dL (ref 0.3–1.2)
Total Protein: 5.2 g/dL — ABNORMAL LOW (ref 6.5–8.1)

## 2017-02-05 LAB — CBG MONITORING, ED
GLUCOSE-CAPILLARY: 239 mg/dL — AB (ref 65–99)
Glucose-Capillary: 74 mg/dL (ref 65–99)
Glucose-Capillary: 299 mg/dL — ABNORMAL HIGH (ref 65–99)

## 2017-02-05 LAB — CBC WITH DIFFERENTIAL/PLATELET
BASOS PCT: 0 %
Basophils Absolute: 0 10*3/uL (ref 0.0–0.1)
EOS ABS: 0.1 10*3/uL (ref 0.0–0.7)
Eosinophils Relative: 1 %
HCT: 46.8 % — ABNORMAL HIGH (ref 36.0–46.0)
HEMOGLOBIN: 14.4 g/dL (ref 12.0–15.0)
LYMPHS ABS: 1.1 10*3/uL (ref 0.7–4.0)
Lymphocytes Relative: 8 %
MCH: 27.3 pg (ref 26.0–34.0)
MCHC: 30.8 g/dL (ref 30.0–36.0)
MCV: 88.8 fL (ref 78.0–100.0)
MONOS PCT: 7 %
Monocytes Absolute: 1 10*3/uL (ref 0.1–1.0)
NEUTROS ABS: 12.5 10*3/uL — AB (ref 1.7–7.7)
NEUTROS PCT: 85 %
PLATELETS: DECREASED 10*3/uL (ref 150–400)
RBC: 5.27 MIL/uL — AB (ref 3.87–5.11)
RDW: 19 % — ABNORMAL HIGH (ref 11.5–15.5)
WBC: 14.7 10*3/uL — AB (ref 4.0–10.5)

## 2017-02-05 LAB — HEMOGLOBIN A1C
HEMOGLOBIN A1C: 9.9 % — AB (ref 4.8–5.6)
MEAN PLASMA GLUCOSE: 237.43 mg/dL

## 2017-02-05 LAB — GLUCOSE, CAPILLARY
GLUCOSE-CAPILLARY: 183 mg/dL — AB (ref 65–99)
GLUCOSE-CAPILLARY: 219 mg/dL — AB (ref 65–99)

## 2017-02-05 LAB — AMMONIA: AMMONIA: 34 umol/L (ref 9–35)

## 2017-02-05 MED ORDER — ASPIRIN EC 81 MG PO TBEC
81.0000 mg | DELAYED_RELEASE_TABLET | Freq: Every day | ORAL | Status: DC
Start: 2017-02-05 — End: 2017-02-06
  Administered 2017-02-05 – 2017-02-06 (×2): 81 mg via ORAL
  Filled 2017-02-05 (×2): qty 1

## 2017-02-05 MED ORDER — LEVETIRACETAM 100 MG/ML PO SOLN
380.0000 mg | ORAL | Status: DC
  Filled 2017-02-05: qty 5

## 2017-02-05 MED ORDER — LEVETIRACETAM 100 MG/ML PO SOLN
750.0000 mg | Freq: Every day | ORAL | Status: DC
  Administered 2017-02-05: 750 mg via ORAL
  Filled 2017-02-05 (×2): qty 7.5

## 2017-02-05 MED ORDER — SEVELAMER CARBONATE 800 MG PO TABS
800.0000 mg | ORAL_TABLET | Freq: Three times a day (TID) | ORAL | Status: DC
  Administered 2017-02-05 – 2017-02-06 (×2): 800 mg via ORAL
  Filled 2017-02-05 (×3): qty 1

## 2017-02-05 MED ORDER — CINACALCET HCL 30 MG PO TABS
60.0000 mg | ORAL_TABLET | Freq: Every day | ORAL | Status: DC
  Filled 2017-02-05: qty 2

## 2017-02-05 MED ORDER — PANTOPRAZOLE SODIUM 40 MG PO TBEC
40.0000 mg | DELAYED_RELEASE_TABLET | Freq: Two times a day (BID) | ORAL | Status: DC
Start: 2017-02-05 — End: 2017-02-06
  Administered 2017-02-05 – 2017-02-06 (×3): 40 mg via ORAL
  Filled 2017-02-05 (×3): qty 1

## 2017-02-05 MED ORDER — ALBUTEROL SULFATE (2.5 MG/3ML) 0.083% IN NEBU: 2.5000 mg | INHALATION_SOLUTION | RESPIRATORY_TRACT | Status: DC

## 2017-02-05 MED ORDER — CLINDAMYCIN HCL 300 MG PO CAPS
300.0000 mg | ORAL_CAPSULE | Freq: Three times a day (TID) | ORAL | Status: DC
  Administered 2017-02-05 (×3): 300 mg via ORAL
  Filled 2017-02-05: qty 2
  Filled 2017-02-05: qty 1
  Filled 2017-02-05: qty 2
  Filled 2017-02-05 (×3): qty 1

## 2017-02-05 MED ORDER — ONDANSETRON HCL 4 MG/2ML IJ SOLN: 4.0000 mg | Freq: Four times a day (QID) | INTRAMUSCULAR | Status: DC | PRN

## 2017-02-05 MED ORDER — INSULIN ASPART 100 UNIT/ML ~~LOC~~ SOLN
0.0000 [IU] | Freq: Three times a day (TID) | SUBCUTANEOUS | Status: DC
  Administered 2017-02-05: 3 [IU] via SUBCUTANEOUS
  Administered 2017-02-05 – 2017-02-06 (×2): 2 [IU] via SUBCUTANEOUS
  Filled 2017-02-05: qty 1

## 2017-02-05 MED ORDER — METOCLOPRAMIDE HCL 5 MG PO TABS
5.0000 mg | ORAL_TABLET | Freq: Three times a day (TID) | ORAL | Status: DC
Start: 2017-02-05 — End: 2017-02-06
  Administered 2017-02-05 – 2017-02-06 (×4): 5 mg via ORAL
  Filled 2017-02-05 (×4): qty 1

## 2017-02-05 MED ORDER — INSULIN ASPART 100 UNIT/ML ~~LOC~~ SOLN: 0.0000 [IU] | Freq: Every day | SUBCUTANEOUS | Status: DC

## 2017-02-05 MED ORDER — SERTRALINE HCL 50 MG PO TABS
50.0000 mg | ORAL_TABLET | Freq: Every day | ORAL | Status: DC
  Administered 2017-02-05 – 2017-02-06 (×2): 50 mg via ORAL
  Filled 2017-02-05 (×2): qty 1

## 2017-02-05 MED ORDER — HEPARIN SODIUM (PORCINE) 5000 UNIT/ML IJ SOLN
5000.0000 [IU] | Freq: Three times a day (TID) | INTRAMUSCULAR | Status: DC
  Administered 2017-02-05 – 2017-02-06 (×3): 5000 [IU] via SUBCUTANEOUS
  Filled 2017-02-05 (×3): qty 1

## 2017-02-05 MED ORDER — IPRATROPIUM-ALBUTEROL 0.5-2.5 (3) MG/3ML IN SOLN: 3.0000 mL | Freq: Four times a day (QID) | RESPIRATORY_TRACT | Status: DC | PRN

## 2017-02-05 MED ORDER — AMLODIPINE BESYLATE 10 MG PO TABS
10.0000 mg | ORAL_TABLET | Freq: Every day | ORAL | Status: DC
  Administered 2017-02-05 – 2017-02-06 (×2): 10 mg via ORAL
  Filled 2017-02-05: qty 2
  Filled 2017-02-05: qty 1

## 2017-02-05 MED ORDER — ONDANSETRON HCL 4 MG PO TABS: 4.0000 mg | ORAL_TABLET | Freq: Four times a day (QID) | ORAL | Status: DC | PRN

## 2017-02-05 MED ORDER — TRAMADOL HCL 50 MG PO TABS: 50.0000 mg | ORAL_TABLET | Freq: Three times a day (TID) | ORAL | Status: DC | PRN

## 2017-02-05 MED ORDER — FAMOTIDINE 20 MG PO TABS
10.0000 mg | ORAL_TABLET | Freq: Every day | ORAL | Status: DC
  Administered 2017-02-05 – 2017-02-06 (×2): 10 mg via ORAL
  Filled 2017-02-05 (×2): qty 1

## 2017-02-05 MED ORDER — SODIUM CHLORIDE 0.9 % IV SOLN
INTRAVENOUS | Status: AC
  Administered 2017-02-05: 13:00:00 via INTRAVENOUS

## 2017-02-05 MED ORDER — MIRTAZAPINE 7.5 MG PO TABS
15.0000 mg | ORAL_TABLET | Freq: Every day | ORAL | Status: DC
  Administered 2017-02-05: 15 mg via ORAL
  Filled 2017-02-05 (×2): qty 1

## 2017-02-05 MED ORDER — IPRATROPIUM-ALBUTEROL 0.5-2.5 (3) MG/3ML IN SOLN
3.0000 mL | RESPIRATORY_TRACT | Status: DC
  Administered 2017-02-05 – 2017-02-06 (×4): 3 mL via RESPIRATORY_TRACT
  Filled 2017-02-05 (×4): qty 3

## 2017-02-05 MED ORDER — SEVELAMER CARBONATE 800 MG PO TABS
800.0000 mg | ORAL_TABLET | Freq: Three times a day (TID) | ORAL | Status: DC
  Administered 2017-02-05: 800 mg via ORAL
  Filled 2017-02-05 (×2): qty 1

## 2017-02-05 MED ORDER — CARVEDILOL 25 MG PO TABS
25.0000 mg | ORAL_TABLET | Freq: Two times a day (BID) | ORAL | Status: DC
Start: 2017-02-05 — End: 2017-02-06
  Administered 2017-02-05: 25 mg via ORAL
  Filled 2017-02-05: qty 1

## 2017-02-05 MED ORDER — ALBUTEROL SULFATE (2.5 MG/3ML) 0.083% IN NEBU
3.0000 mL | INHALATION_SOLUTION | Freq: Four times a day (QID) | RESPIRATORY_TRACT | Status: DC | PRN
  Administered 2017-02-05: 3 mL via RESPIRATORY_TRACT
  Filled 2017-02-05: qty 3

## 2017-02-05 MED ORDER — ROSUVASTATIN CALCIUM 10 MG PO TABS
10.0000 mg | ORAL_TABLET | Freq: Every day | ORAL | Status: DC
  Administered 2017-02-05 – 2017-02-06 (×2): 10 mg via ORAL
  Filled 2017-02-05 (×2): qty 1

## 2017-02-05 MED ORDER — RENA-VITE PO TABS
1.0000 | ORAL_TABLET | Freq: Every day | ORAL | Status: DC
  Administered 2017-02-05 – 2017-02-06 (×2): 1 via ORAL
  Filled 2017-02-05 (×3): qty 1

## 2017-02-05 MED ORDER — ACETAMINOPHEN 500 MG PO TABS: 500.0000 mg | ORAL_TABLET | Freq: Four times a day (QID) | ORAL | Status: DC | PRN

## 2017-02-05 NOTE — ED Triage Notes (Signed)
Pt from home by [facility] for hypoglycemia. Pt CBG was 51 this am and normally runs  In the 200's. Pt A&O now after IM glucagon CBG 70

## 2017-02-05 NOTE — Progress Notes (Signed)
Inpatient Diabetes Program Recommendations  AACE/ADA: New Consensus Statement on Inpatient Glycemic Control (2015)  Target Ranges:  Prepandial:   less than 140 mg/dL      Peak postprandial:   less than 180 mg/dL (1-2 hours)      Critically ill patients:  140 - 180 mg/dL   Lab Results  Component Value Date   GLUCAP 239 (H) 02/05/2017   HGBA1C 9.9 (H) 02/05/2017    Review of Glycemic Control  Diabetes history: DM2 Outpatient Diabetes medications: Lantus 10 units QHS                             Humalog 3-10 units TID per SSI Current orders for Inpatient glycemic control: Novolog correction 0-9 units tid + 0-5 units hs  Inpatient Diabetes Program Recommendations:  Noted patient admitted with hypoglycemia. Patient was discharged on reduced dosage of Lantus 10 units daily. Please consider: -D/C hs Novolog correction scale -Review CBGs and start small dose of Lantus if needed as appropriate Will follow during hospitalization.  Thank you, Billy Fischer. Kyesha Balla, RN, MSN, CDE  Diabetes Coordinator Inpatient Glycemic Control Team Team Pager [phone] (8am-5pm) 02/05/2017 1:04 PM

## 2017-02-05 NOTE — ED Notes (Signed)
Admitting PA Provider at bedside.

## 2017-02-05 NOTE — ED Provider Notes (Signed)
[facility]-EMERGENCY DEPT Provider Note   CSN: [id number] Arrival date & time:        History   Chief Complaint Chief Complaint  Patient presents with  . Hypoglycemia    HPI Dajha Hollinger is a 61 y.o. female.  The history is provided by the patient.  Hypoglycemia  Initial blood sugar:  51 Blood sugar after intervention:  70 Severity:  Moderate Onset quality:  Gradual Timing:  Unable to specify Progression:  Resolved Chronicity:  New Diabetic status:  Controlled with insulin Context: decreased oral intake and recent illness   Relieved by:  Glucagon Ineffective treatments:  None tried Associated symptoms: decreased responsiveness, dizziness and weakness   Associated symptoms: no shortness of breath and no vomiting    61 year old female who presents with hypoglycemia. She has a history of end-stage renal disease, last dialyzed yesterday, CAD, CVA, seizure disorder on Keppra, and diabetes on insulin. She reports waking up this morning feeling very confused. EMS was called, and her glucose was 51. She did receive Intramuscular glucagon, and her subsequent glucose was 70. She denies any recent changes to her insulin, did not give herself insulin this morning. Does report feeling unwell recently after discharge from the hospital earlier this month. States that she has had persistent productive cough but no fever. She has also been having episodes of diarrhea with decreased appetite. No nausea, vomiting, abdominal pain.  Past Medical History:  Diagnosis Date  . Abnormal Doppler ultrasound of carotid artery    a. Per [place] records: <50% LICA.  Marland Kitchen Anasarca    a. Per [place] records - due to pulm HTN with R HF.   Marland Kitchen Aseptic meningitis    a. 09/2012: adm in [place] for metabolic encephalopathy, oliguric tubular necrosis, anemia, HTN, possible CVA, HHNKA.  Marland Kitchen CHF (congestive heart failure) (HCC)    a. HFpEF with RHF/anasarca/pHTN.  . CKD (chronic kidney disease), stage III    a. Per [place] records: h/o ARF  after CTA that ruled out PE.  Marland Kitchen COPD (chronic obstructive pulmonary disease) (HCC)   . Coronary artery disease    a. Per [place] records: NSTEMI 01/2012, tx medically given ARF but suspected CAD. b. Stress test 12/16/11 reported w/o ischemia.  Marland Kitchen CVA (cerebral infarction)    a. Per [place] records, "possible CVA" 09/2012 but MRI reportedly negative.  . Diabetes mellitus (HCC)   . Dialysis patient [facility])    Mon, Wed, Fridays  . Hypertension   . OSA (obstructive sleep apnea)    a. Pt reported used to use CPAP in [place], but "ran out" when came to [facility].  Marland Kitchen Pulmonary hypertension (HCC)    a. [facility] 02/28/13: mod pulm HTN with normal PVR suggestive of predominantly pulmonary venous HTN.  Marland Kitchen Renal insufficiency    dialysis 3 days/week  . Right heart failure (HCC)   . Stroke [facility]) 2014    Patient Active Problem List   Diagnosis Date Noted  . Status epilepticus (HCC) 01/05/2017  . Hyperosmolar non-ketotic state in patient with type 2 diabetes mellitus (HCC)   . Hypertensive emergency   . Altered mental status 10/29/2016  . Chronic hepatitis C without hepatic coma (HCC) 03/10/2016  . DKA (diabetic ketoacidosis) (HCC) 02/08/2016  . Uncontrollable vomiting   . Emesis, persistent 02/07/2016  . Abnormal abdominal ultrasound 10/24/2014  . Acute encephalopathy 10/23/2014  . DKA, type 2, not at goal [facility]) 10/23/2014  . Nausea & vomiting 10/23/2014  . Hypertension 10/23/2014  . Abdominal pain   . Encounter for  central line placement   . Fever   . Anemia 10/10/2014  . Hypoglycemia 02/07/2014  . Seizure (HCC) 02/07/2014  . ESRD on hemodialysis (HCC) 02/07/2014  . Urinary tract infection, site not specified 02/07/2014  . Pain in the abdomen 01/10/2014  . Anemia of chronic renal failure 01/10/2014  . Secondary hyperparathyroidism (HCC) 01/10/2014  . Hyperglycemia 01/07/2014  . End stage renal disease (HCC) 10/13/2013  . PUD (peptic ulcer disease) 06/03/2013  . Chronic diastolic heart failure (HCC) 03/21/2013  .  Acute on chronic diastolic congestive heart failure (HCC) 03/01/2013  . OSA on CPAP 02/16/2013  . Acute respiratory failure with hypercapnia (HCC) 02/14/2013  . HTN (hypertension), malignant 02/14/2013  . Diabetes mellitus type II, uncontrolled (HCC) 02/14/2013  . DM (diabetes mellitus) (HCC) 04/05/2012    Past Surgical History:  Procedure Laterality Date  . ABDOMINAL HYSTERECTOMY    . AV FISTULA PLACEMENT Left 03/03/2013   Procedure: ARTERIOVENOUS (AV) FISTULA CREATION, Brachial/Cephalic;  Surgeon: Larina Earthly, MD;  Location[facility] OR;  Service: Vascular;  Laterality: Left;  . ESOPHAGOGASTRODUODENOSCOPY N/A 02/16/2013   Procedure: ESOPHAGOGASTRODUODENOSCOPY (EGD);  Surgeon: Graylin Shiver, MD;  Location: [facility] ENDOSCOPY;  Service: Endoscopy;  Laterality: N/A;  . INSERTION OF DIALYSIS CATHETER Right 03/03/2013   Procedure: INSERTION OF DIALYSIS CATHETER;  Surgeon: Larina Earthly, MD;  Location: [facility] OR;  Service: Vascular;  Laterality: Right;  Right Internal Jugular Placement  . RIGHT HEART CATHETERIZATION N/A 02/28/2013   Procedure: RIGHT HEART CATH;  Surgeon: Dolores Patty, MD;  Location[facility] CATH LAB;  Service: Cardiovascular;  Laterality: N/A;  . SHUNTOGRAM N/A 07/13/2013   Procedure: FISTULOGRAM;  Surgeon: Fransisco Hertz, MD;  Location: [facility] CATH LAB;  Service: Cardiovascular;  Laterality: N/A;  . TUBAL LIGATION      OB History    No data available       Home Medications    Prior to Admission medications   Medication Sig Start Date End Date Taking? Authorizing Provider  acetaminophen (TYLENOL) 500 MG tablet Take 500-1,000 mg by mouth every 6 (six) hours as needed (for pain).     [provider]  albuterol (PROVENTIL HFA;VENTOLIN HFA) 108 (90 BASE) MCG/ACT inhaler Inhale 2 puffs into the lungs every 6 (six) hours as needed for wheezing or shortness of breath.     [provider]  amLODipine (NORVASC) 10 MG tablet Take 10 mg by mouth daily.     [provider]    aspirin EC 81 MG tablet Take 81 mg by mouth daily.    [provider]  carvedilol (COREG) 25 MG tablet Take 25 mg by mouth 2 (two) times daily with a meal.    [provider]  cinacalcet (SENSIPAR) 30 MG tablet Take 2 tablets (60 mg total) by mouth daily with supper. 11/03/16   Dorothea Ogle, MD  insulin glargine (LANTUS) 100 UNIT/ML injection Inject 0.1 mLs (10 Units total) into the skin daily. 01/11/17   Penny Pia, MD  insulin lispro (HUMALOG) 100 UNIT/ML injection Inject 3-10 Units into the skin 3 (three) times daily before meals. Per sliding scale: "BGL 200 = 3 units; 250 = 5 units; 300 = 7 units; 350 = 9-10 units"    [provider]  levETIRAcetam (KEPPRA) 100 MG/ML solution Take 3.8 mLs (380 mg total) by mouth every Tuesday, Thursday, and Saturday at 6 PM. 01/12/17   Penny Pia, MD  levETIRAcetam (KEPPRA) 100 MG/ML solution Take 7.5 mLs (750 mg total) by mouth  daily at 3 pm. 01/10/17   Penny Pia, MD  metoCLOPramide (REGLAN) 5 MG tablet Take 1 tablet (5 mg total) by mouth 4 (four) times daily -  before meals and at bedtime. 10/26/14   Joseph Art, DO  mirtazapine (REMERON) 15 MG tablet Take 15 mg by mouth at bedtime.    [provider]  multivitamin (RENA-VIT) TABS tablet Take 1 tablet by mouth at bedtime. Patient taking differently: Take 1 tablet by mouth daily.  11/03/16   Dorothea Ogle, MD  nitroGLYCERIN (NITROSTAT) 0.4 MG SL tablet PLACE 1 TABLET UNDER THE TONGUE EVERY FIVE MINUTES AS NEEDED FOR CHEST PAIN 01/12/17   Bensimhon, Bevelyn Buckles, MD  pantoprazole (PROTONIX) 40 MG tablet Take 1 tablet (40 mg total) by mouth 2 (two) times daily. 06/05/13   Coolidge Breeze, MD  ranitidine (ZANTAC) 150 MG tablet Take 150 mg by mouth daily as needed for heartburn.    [provider]  rosuvastatin (CRESTOR) 10 MG tablet Take 1 tablet (10 mg total) by mouth daily. 03/16/16   Gardiner Barefoot, MD  sertraline (ZOLOFT) 50 MG tablet Take 1 tablet (50 mg  total) by mouth daily. 01/10/17 01/10/18  Penny Pia, MD  sevelamer (RENAGEL) 800 MG tablet Take 1,600 mg by mouth 3 (three) times daily with meals.    [provider]  traMADol (ULTRAM) 50 MG tablet Take 50 mg by mouth 3 (three) times daily as needed for moderate pain.     [provider]    Family History Family History  Problem Relation Age of Onset  . Diabetes Mellitus II Sister   . Diabetes Mellitus II Brother   . CAD Brother     Social History Social History  Substance Use Topics  . Smoking status: Former Games developer  . Smokeless tobacco: Current User    Types: Snuff  . Alcohol use No     Allergies   Patient has no known allergies.   Review of Systems Review of Systems  Constitutional: Positive for decreased responsiveness. Negative for fever.  Respiratory: Positive for cough. Negative for shortness of breath.   Cardiovascular: Negative for chest pain.  Gastrointestinal: Positive for diarrhea. Negative for abdominal pain, nausea and vomiting.  Neurological: Positive for dizziness and weakness.  All other systems reviewed and are negative.    Physical Exam Updated Vital Signs BP (!) 174/87   Temp (!) 93.3 F (34.1 C) (Rectal)   Resp 15   Ht  (1.448 m)   Wt 59 kg (130 lb)   SpO2 100%   BMI 28.13 kg/m   Physical Exam Physical Exam  Nursing note and vitals reviewed. Constitutional: appears low energy and fatigued, slow to answer questions, in no acute distress Head: Normocephalic and atraumatic.  Mouth/Throat: Oropharynx is clear and dry.  Neck: Normal range of motion. Neck supple.  Cardiovascular: Normal rate and regular rhythm.   Pulmonary/Chest: Effort normal. Scattered wheezes. Abdominal: Soft. There is no tenderness. There is no rebound and no guarding.  Musculoskeletal: Normal range of motion.  Neurological: Alert, no facial droop, fluent speech, moves all extremities symmetrically Skin: Skin is warm and dry.  Psychiatric:  Cooperative   ED Treatments / Results  Labs (all labs ordered are listed, but only abnormal results are displayed) Labs Reviewed  CBG MONITORING, ED    EKG  EKG Interpretation None       Radiology No results found.  Procedures Procedures (including critical care time) CRITICAL CARE Performed by: Neysa Bonito  Kinley Dozier   Total critical care time: 31 minutes  Critical care time was exclusive of separately billable procedures and treating other patients.  Critical care was necessary to treat or prevent imminent or life-threatening deterioration.  Critical care was time spent personally by me on the following activities: development of treatment plan with patient and/or surrogate as well as nursing, discussions with consultants, evaluation of patient's response to treatment, examination of patient, obtaining history from patient or surrogate, ordering and performing treatments and interventions, ordering and review of laboratory studies, ordering and review of radiographic studies, pulse oximetry and re-evaluation of patient's condition.  Medications Ordered in ED Medications - No data to display   Initial Impression / Assessment and Plan / ED Course  I have reviewed the triage vital signs and the nursing notes.  Pertinent labs & imaging results that were available during my care of the patient were reviewed by me and considered in my medical decision making (see chart for details).    Presenting with hypoglycemia prior to arrival. Without additional intervention, glucose has been 90 and subsequently in the 200s. She is very hypothermic with a core temp of 93.3 Fahrenheit on arrival. I suspect that she had prolonged hypoglycemia before she woke up. A bair hugger was applied. She is not confused, but seems very listless and lethargic. I feel this is probably related to her hypothermia.blood work otherwise reassuring. Discussed with Toya Smothers from hospitalist service who will admit for  observation.  Final Clinical Impressions(s) / ED Diagnoses   Final diagnoses:  None    New Prescriptions New Prescriptions   No medications on file     Lavera Guise, MD 02/05/17 1115

## 2017-02-05 NOTE — H&P (Signed)
History and Physical    Madeline [name] ZOX:[id number] DOB: 1955/07/31 DOA: 02/05/2017  PCP: Madeline Banner, MD Patient coming from: home  Chief Complaint: hypothermia/hypoglycemia  HPI: Madeline [name] is a 61 y.o. female with medical history significant uncontrolled diabetes, end stage renal disease on dialysis Tuesday Thursday and Saturday, COPD, coronary artery disease, chronic heart failure, seizures severely uncontrolled hypertension with previous admissions for hypertensive emergency presents to the emergency department chief complaint of altered mental status. Initial evaluation reveals hypothermia and hypoglycemia.  Information is obtained from the chart and the patient noting that information from patient may be unreliable she is quite lethargic. Reportedly patient awakened this morning feeling confused. EMS was called and her glucose was noted to be 51. She was given glucagon intramuscularly and her glucose rose to 70. She reports she lives with family and someone helps her with her medicines. She denies any recent change to her insulin and states she did not give herself insulin this morning. She denies headache dizziness chest pain palpitations shortness of breath. She denies abdominal pain nausea vomiting. She reports she does make some urine.     ED Course: In the emergency department she has a rectal temp of 95.2 blood pressure 156/74 heart rate 60 relation 16 somewhat shallow. Oxygen saturation level 100% on 2 L. She is provided with a bear hugger and her glucose was greater than 2002.  Review of Systems: As per HPI otherwise all other systems reviewed and are negative.   Ambulatory Status: Patient reports needing a wheelchair and a cane  Past Medical History:  Diagnosis Date  . Abnormal Doppler ultrasound of carotid artery    a. Per [place] records: <50% LICA.  Marland Kitchen Anasarca    a. Per [place] records - due to pulm HTN with R HF.   Marland Kitchen Aseptic meningitis    a. 09/2012: adm in [place] for metabolic  encephalopathy, oliguric tubular necrosis, anemia, HTN, possible CVA, HHNKA.  Marland Kitchen CHF (congestive heart failure) (HCC)    a. HFpEF with RHF/anasarca/pHTN.  . CKD (chronic kidney disease), stage III    a. Per [place] records: h/o ARF after CTA that ruled out PE.  Marland Kitchen COPD (chronic obstructive pulmonary disease) (HCC)   . Coronary artery disease    a. Per [place] records: NSTEMI 01/2012, tx medically given ARF but suspected CAD. b. Stress test 12/16/11 reported w/o ischemia.  Marland Kitchen CVA (cerebral infarction)    a. Per [place] records, "possible CVA" 09/2012 but MRI reportedly negative.  . Diabetes mellitus (HCC)   . Dialysis patient [facility])    Mon, Wed, Fridays  . Hypertension   . OSA (obstructive sleep apnea)    a. Pt reported used to use CPAP in [place], but "ran out" when came to [place].  Marland Kitchen Pulmonary hypertension (HCC)    a. RHC 02/28/13: mod pulm HTN with normal PVR suggestive of predominantly pulmonary venous HTN.  Marland Kitchen Renal insufficiency    dialysis 3 days/week  . Right heart failure (HCC)   . Stroke [facility]) 2014    Past Surgical History:  Procedure Laterality Date  . ABDOMINAL HYSTERECTOMY    . AV FISTULA PLACEMENT Left 03/03/2013   Procedure: ARTERIOVENOUS (AV) FISTULA CREATION, Brachial/Cephalic;  Surgeon: Larina Earthly, MD;  Location[facility] OR;  Service: Vascular;  Laterality: Left;  . ESOPHAGOGASTRODUODENOSCOPY N/A 02/16/2013   Procedure: ESOPHAGOGASTRODUODENOSCOPY (EGD);  Surgeon: Graylin Shiver, MD;  Location: [facility] ENDOSCOPY;  Service: Endoscopy;  Laterality: N/A;  . INSERTION OF DIALYSIS CATHETER Right 03/03/2013  Procedure: INSERTION OF DIALYSIS CATHETER;  Surgeon: Larina Earthly, MD;  Location: [facility] OR;  Service: Vascular;  Laterality: Right;  Right Internal Jugular Placement  . RIGHT HEART CATHETERIZATION N/A 02/28/2013   Procedure: RIGHT HEART CATH;  Surgeon: Dolores Patty, MD;  Location: [facility] CATH LAB;  Service: Cardiovascular;  Laterality: N/A;  . SHUNTOGRAM N/A 07/13/2013   Procedure: FISTULOGRAM;  Surgeon: Fransisco Hertz, MD;  Location: [facility] CATH LAB;  Service: Cardiovascular;  Laterality: N/A;  . TUBAL LIGATION      Social History   Social History  . Marital status: Widowed    Spouse name: N/A  . Number of children: 4  . Years of education: 9   Occupational History  .      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 Smoking status: Former Games developer  . Smokeless tobacco: Current User    Types: Snuff  . Alcohol use No  . Drug use: No  . Sexual activity: No   Other Topics Concern  . Not on file   Social History Narrative   Lives at home with sister   Caffeine use- none    No Known Allergies  Family History  Problem Relation Age of Onset  . Diabetes Mellitus II Sister   . Diabetes Mellitus II Brother   . CAD Brother     Prior to Admission medications   Medication Sig Start Date End Date Taking? Authorizing Provider  acetaminophen (TYLENOL) 500 MG tablet Take 500-1,000 mg by mouth every 6 (six) hours as needed (for pain).    Yes [provider]  albuterol (PROVENTIL HFA;VENTOLIN HFA) 108 (90 BASE) MCG/ACT inhaler Inhale 2 puffs into the lungs every 6 (six) hours as needed for wheezing or shortness of breath.    Yes [provider]  amLODipine (NORVASC) 10 MG tablet Take 10 mg by mouth daily.    Yes [provider]  aspirin EC 81 MG tablet Take 81 mg by mouth daily.   Yes [provider]  carvedilol (COREG) 25 MG tablet Take 25 mg by mouth 2 (two) times daily with a meal.   Yes [provider]  cinacalcet (SENSIPAR) 30 MG tablet Take 2 tablets (60 mg total) by mouth daily with supper. 11/03/16  Yes Dorothea Ogle, MD  clindamycin (CLEOCIN) 300 MG capsule Take 300 mg by mouth 3 (three) times daily. 02/03/17 02/13/17 Yes [provider]  insulin glargine (LANTUS) 100 UNIT/ML injection Inject 0.1 mLs (10 Units total) into the skin daily. 01/11/17  Yes Penny Pia, MD  insulin lispro (HUMALOG) 100 UNIT/ML injection Inject 3-10  Units into the skin 3 (three) times daily before meals. Per sliding scale: "BGL 200 = 3 units; 250 = 5 units; 300 = 7 units; 350 = 9-10 units"   Yes [provider]  Insulin Syringe-Needle U-100 (B-D INS SYR HALF-UNIT .3CC/31G) 31G X 5/16" 0.3 ML MISC 1 each as needed. 01/08/17  Yes [provider]  ipratropium-albuterol (DUONEB) 0.5-2.5 (3) MG/3ML SOLN Take 3 mLs by nebulization daily as needed. 01/26/17  Yes [provider]  levETIRAcetam (KEPPRA) 100 MG/ML solution Take 3.8 mLs (380 mg total) by mouth every Tuesday, Thursday, and Saturday at 6 PM. 01/12/17  Yes Penny Pia, MD  levETIRAcetam (KEPPRA) 100 MG/ML solution Take 7.5 mLs (750 mg total) by mouth daily at 3 pm. 01/10/17  Yes Penny Pia, MD  metoCLOPramide (REGLAN) 5 MG tablet Take 1 tablet (5 mg total) by mouth  4 (four) times daily -  before meals and at bedtime. 10/26/14  Yes Vann, Jessica U, DO  mirtazapine (REMERON) 15 MG tablet Take 15 mg by mouth at bedtime.   Yes [provider]  multivitamin (RENA-VIT) TABS tablet Take 1 tablet by mouth at bedtime. Patient taking differently: Take 1 tablet by mouth daily.  11/03/16  Yes Dorothea Ogle, MD  Nebulizers [facility] ESSENCE NEBULIZER) MISC 1 each as needed. 01/26/17  Yes [provider]  nitroGLYCERIN (NITROSTAT) 0.4 MG SL tablet PLACE 1 TABLET UNDER THE TONGUE EVERY FIVE MINUTES AS NEEDED FOR CHEST PAIN 01/12/17  Yes Bensimhon, Bevelyn Buckles, MD  pantoprazole (PROTONIX) 40 MG tablet Take 1 tablet (40 mg total) by mouth 2 (two) times daily. 06/05/13  Yes Chikowski, Vinnie Langton, MD  ranitidine (ZANTAC) 150 MG tablet Take 150 mg by mouth daily as needed for heartburn.   Yes [provider]  rosuvastatin (CRESTOR) 10 MG tablet Take 1 tablet (10 mg total) by mouth daily. 03/16/16  Yes Comer, Belia Heman, MD  sertraline (ZOLOFT) 50 MG tablet Take 1 tablet (50 mg total) by mouth daily. 01/10/17 01/10/18 Yes Penny Pia, MD  sevelamer (RENAGEL) 800 MG tablet  Take 1,600 mg by mouth 3 (three) times daily with meals.   Yes [provider]  traMADol (ULTRAM) 50 MG tablet Take 50 mg by mouth 3 (three) times daily as needed for moderate pain.    Yes [provider]    Physical Exam: Vitals:   02/05/17 0930 02/05/17 0945 02/05/17 1053 02/05/17 1054  BP: 131/74  (!) 156/74   Pulse:  (!) 56 (!) 59   Resp: Temp:    (!) 95.2 F (35.1 C)  TempSrc:    Rectal  SpO2:  100% 100%   Weight:      Height:         General:  Appears Chronically ill quite lethargic in no acute distress Eyes:  PERRL, EOMI, normal lids, iris ENT:  grossly normal hearing, lips & tongue, mucous membranes of her mouth are pink somewhat dry Neck:  no LAD, masses or thyromegaly Cardiovascular:  RRR, no m/r/g. No LE edema.  Respiratory:  Normal effort. Respirations somewhat shallow. Breath sounds quite coarse throughout. I hear no crackles date end expiratory wheezing Abdomen:  soft, ntnd, positive bowel sounds but sluggish no guarding or rebounding Skin:  no rash or induration seen on limited exam Musculoskeletal:  grossly normal tone BUE/BLE, good ROM, no bony abnormality Psychiatric:  grossly normal mood and affect, speech fluent and appropriate, AOx3 Neurologic:  Patient quite lethargic but arouses to mild sternal rub. Able to follow simple commands. Speech clear but basically soft. Moving all extremities spontaneously.  Labs on Admission: I have personally reviewed following labs and imaging studies  CBC:  Recent Labs Lab 02/05/17 1016  WBC 14.7*  NEUTROABS 12.5*  HGB 14.4  HCT 46.8*  MCV 88.8  PLT PLATELET CLUMPS NOTED ON SMEAR, COUNT APPEARS DECREASED   Basic Metabolic Panel:  Recent Labs Lab 02/05/17 1016  NA 136  K 5.2*  CL 102  CO2 21*  GLUCOSE 257*  BUN 39*  CREATININE 3.69*  CALCIUM 8.0*   GFR: Estimated Creatinine Clearance: 12.1 mL/min (A) (by C-G formula based on SCr of 3.69 mg/dL (H)). Liver Function  Tests:  Recent Labs Lab 02/05/17 1016  AST 33  ALT 28  ALKPHOS 119  BILITOT 0.6  PROT 5.2*  ALBUMIN 2.2*   No results  for input(s): LIPASE, AMYLASE in the last 168 hours. No results for input(s): AMMONIA in the last 168 hours. Coagulation Profile: No results for input(s): INR, PROTIME in the last 168 hours. Cardiac Enzymes: No results for input(s): CKTOTAL, CKMB, CKMBINDEX, TROPONINI in the last 168 hours. BNP (last 3 results) No results for input(s): PROBNP in the last 8760 hours. HbA1C:  Recent Labs  02/05/17 1016  HGBA1C 9.9*   CBG:  Recent Labs Lab 02/05/17 0822 02/05/17 0959 02/05/17 1122  GLUCAP 74 299* 239*   Lipid Profile: No results for input(s): CHOL, HDL, LDLCALC, TRIG, CHOLHDL, LDLDIRECT in the last 72 hours. Thyroid Function Tests: No results for input(s): TSH, T4TOTAL, FREET4, T3FREE, THYROIDAB in the last 72 hours. Anemia Panel: No results for input(s): VITAMINB12, FOLATE, FERRITIN, TIBC, IRON, RETICCTPCT in the last 72 hours. Urine analysis:    Component Value Date/Time   COLORURINE YELLOW 11/01/2016 2309   APPEARANCEUR HAZY (A) 11/01/2016 2309   LABSPEC 1.014 11/01/2016 2309   PHURINE 5.0 11/01/2016 2309   GLUCOSEU 150 (A) 11/01/2016 2309   HGBUR NEGATIVE 11/01/2016 2309   BILIRUBINUR NEGATIVE 11/01/2016 2309   KETONESUR NEGATIVE 11/01/2016 2309   PROTEINUR 100 (A) 11/01/2016 2309   UROBILINOGEN 0.2 10/24/2014 0057   NITRITE NEGATIVE 11/01/2016 2309   LEUKOCYTESUR NEGATIVE 11/01/2016 2309    Creatinine Clearance: Estimated Creatinine Clearance: 12.1 mL/min (A) (by C-G formula based on SCr of 3.69 mg/dL (H)).  Sepsis Labs: @LABRCNTIP (procalcitonin:4,lacticidven:4) )No results found for this or any previous visit (from the past 240 hour(s)).   Radiological Exams on Admission: Dg Chest Portable 1 View  Result Date: 02/05/2017 CLINICAL DATA:  Cough. EXAM: PORTABLE CHEST 1 VIEW COMPARISON:  01/27/2017 and prior radiographs FINDINGS:  Upper limits normal heart size and mild peribronchial thickening again noted. There is no evidence of focal airspace disease, pulmonary edema, suspicious pulmonary nodule/mass, pleural effusion, or pneumothorax. No acute bony abnormalities are identified. IMPRESSION: Mild peribronchial thickening without focal pneumonia. Electronically Signed   By: Harmon Pier M.D.   On: 02/05/2017 09:30    EKG: Independently reviewed. Sinus rhythm Left atrial enlargement Right axis deviation Probable anteroseptal infarct, old Nonspecific repol abnormality, inferior leads Borderline ST elevation, lateral leads  Assessment/Plan Principal Problem:   Hypothermia Active Problems:   HTN (hypertension), malignant   Diabetes mellitus type II, uncontrolled (HCC)   Chronic diastolic heart failure (HCC)   Anemia of chronic renal failure   Secondary hyperparathyroidism (HCC)   Hypoglycemia   Seizure (HCC)   ESRD on hemodialysis (HCC)   Acute encephalopathy   Chronic hepatitis C without hepatic coma (HCC)    #1. Hypothermia. Likely related to hypoglycemia but some concern for infection. WBC 14.7. Chest xray without pneumonia. electrolyes appear to be close to baseline. Patient with a history of noncompliance. Rectal temp 93.3 on presentation. She is provided with a bear hugger and improved diabetes control and rectal temp improving at time of admission.  -Admit to telemetry -continue hear hugger -UDS -Urinalysis -monitor rectal temp -Diabetes coordinator consult  #2. Acute encephalopathy. Likely related to above. Improving at time of admission but remains lethargic. She does have a leukocytosis but otherwise no signs symptoms of an infectious process. No reports of seizure activity. Lungs do sound congested chest xray without pneumonia -Hold off on CT for now -See #1 -Obtain B-12 folate ammonia level -urinalysis -nebulizer -Monitor  #3. Diabetes/hypoglycemia. Uncontrolled. Patient with a history of  noncompliance. Denies any recent changes in her insulin regimen. Patient received glucose intramuscularly  in the field. At time of admission CBG 239 -Hold home insulin -Obtain a hemoglobin A1c -Use sliding scale for optimal control -Diabetes coordinator recommendations  #4. End-stage renal disease. On dialysis Tuesday Thursday and Saturday. She does have a tendency to miss dialysis but denies missing her recent session. Creatinine 3.6 on admission. This appears to be close to her range. Potassium level 5.2 -nephrology consult -Continue home meds  #5. Chronic diastolic heart failure. Appears compensated. Echo done a year ago reveals an EF 60% severe LVH and grade 2 diastolic dysfunction. -Daily weights -Monitor intake and output -Continue home meds  #6. Anemia of chronic disease. Hg 14.4 on admission. This appears to be close to her baseline. No signs symptoms obvious bleeding -Monitor -Continue home meds    DVT prophylaxis: heparin Code Status: partial Family Communication: none present  Disposition Plan: home  Consults called: [facility]  Admission status: obs    Toya Smothers M MD [facility]  If 7PM-7AM, please contact night-coverage [email or web address] Password [facility]  02/05/2017, 12:07 PM

## 2017-02-05 NOTE — ED Notes (Signed)
Unable to obtain access in the right hand

## 2017-02-05 NOTE — Progress Notes (Signed)
Patient's daughter Byrd Hesselbach called for an update.  Patient shook head yes when Charge RN asked if RN was allowed to give Byrd Hesselbach an update.  Patient's primary RN also in room when patient gave consent for RN to speak with Byrd Hesselbach.

## 2017-02-05 NOTE — Consult Note (Signed)
[facility] Renal Consultation Note    Indication for Consultation:  Management of ESRD/hemodialysis; anemia, hypertension/volume and secondary hyperparathyroidism PCP: Benedetto Goad MD  HPI: Madeline [name] is a 61 y.o. 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 PMH CHF, CAD, h/o NSTEMI, h/o CVA, COPD on home O2. Recent [facility] admit 01/09/17 with status epilepticus, AMS /respiratory failure requiring intubation. Was extubated and discharged on Keppra x2 months with plans for f/u EEG.   Presented to [facility] ED this morning with altered mental status, hypoglycemia and hypothermia. Glucose noted to be 51 by EMS. Given glucogon and improved to 200s. Temp 95.2 F on arrival to ED, improving with warming blanket. Labs showing Na 136 K 5.2 Glucose 257, WBC 14.7 Hgb 14.4. CXR showing mild peribronchial thickening without focal consolidation.  Will be admitted under observation status for further evaluation.   Seen in ED, now alert and oriented x3. She's not sure how her sugar got so low, did not change insulin dose. Wearing nasal oxygen and breathing seems somewhat labored. Says she's been like this since "they pulled that tube out of my throat" on last hospitalization. Suspected vocal cord trauma. Denies fever, chills, cough, CP, nausea, vomiting, diarrhea.   Dialyzes at [facility] TTS. Last HD yesterday, has been compliant with HD, but hasn't been reaching EDW.    Past Medical History:  Diagnosis Date  . Abnormal Doppler ultrasound of carotid artery    a. Per [employer] records: <50% LICA.  Marland Kitchen Anasarca    a. Per [place] records - due to pulm HTN with R HF.   Marland Kitchen Aseptic meningitis    a. 09/2012: adm in [place] for metabolic encephalopathy, oliguric tubular necrosis, anemia, HTN, possible CVA, HHNKA.  Marland Kitchen CHF (congestive heart failure) (HCC)    a. HFpEF with RHF/anasarca/pHTN.  . CKD (chronic kidney disease), stage III    a. Per [place] records: h/o ARF after CTA that ruled out PE.  Marland Kitchen COPD (chronic  obstructive pulmonary disease) (HCC)   . Coronary artery disease    a. Per [place] records: NSTEMI 01/2012, tx medically given ARF but suspected CAD. b. Stress test 12/16/11 reported w/o ischemia.  Marland Kitchen CVA (cerebral infarction)    a. Per [place] records, "possible CVA" 09/2012 but MRI reportedly negative.  . Diabetes mellitus (HCC)   . Dialysis patient [facility])    Mon, Wed, Fridays  . Hypertension   . OSA (obstructive sleep apnea)    a. Pt reported used to use CPAP in , but "ran out" when came to [facility].  Marland Kitchen Pulmonary hypertension (HCC)    a. [facility] 02/28/13: mod pulm HTN with normal PVR suggestive of predominantly pulmonary venous HTN.  Marland Kitchen Renal insufficiency    dialysis 3 days/week  . Right heart failure (HCC)   . Stroke [facility]) 2014   Past Surgical History:  Procedure Laterality Date  . ABDOMINAL HYSTERECTOMY    . AV FISTULA PLACEMENT Left 03/03/2013   Procedure: ARTERIOVENOUS (AV) FISTULA CREATION, Brachial/Cephalic;  Surgeon: Larina Earthly, MD;  Location: [facility] OR;  Service: Vascular;  Laterality: Left;  . ESOPHAGOGASTRODUODENOSCOPY N/A 02/16/2013   Procedure: ESOPHAGOGASTRODUODENOSCOPY (EGD);  Surgeon: Graylin Shiver, MD;  Location: [facility] ENDOSCOPY;  Service: Endoscopy;  Laterality: N/A;  . INSERTION OF DIALYSIS CATHETER Right 03/03/2013   Procedure: INSERTION OF DIALYSIS CATHETER;  Surgeon: Larina Earthly, MD;  Location: [facility] OR;  Service: Vascular;  Laterality: Right;  Right Internal Jugular Placement  . RIGHT HEART CATHETERIZATION N/A 02/28/2013   Procedure:  RIGHT HEART CATH;  Surgeon: Dolores Patty, MD;  Location: [facility] CATH LAB;  Service: Cardiovascular;  Laterality: N/A;  . SHUNTOGRAM N/A 07/13/2013   Procedure: FISTULOGRAM;  Surgeon: Fransisco Hertz, MD;  Location: [facility] CATH LAB;  Service: Cardiovascular;  Laterality: N/A;  . TUBAL LIGATION     Family History  Problem Relation Age of Onset  . Diabetes Mellitus II Sister   . Diabetes Mellitus II Brother   . CAD Brother    Social History:  reports that she has  quit smoking. Her smokeless tobacco use includes Snuff. She reports that she does not drink alcohol or use drugs. No Known Allergies Prior to Admission medications   Medication Sig Start Date End Date Taking? Authorizing Provider  acetaminophen (TYLENOL) 500 MG tablet Take 500-1,000 mg by mouth every 6 (six) hours as needed (for pain).    Yes [provider]  albuterol (PROVENTIL HFA;VENTOLIN HFA) 108 (90 BASE) MCG/ACT inhaler Inhale 2 puffs into the lungs every 6 (six) hours as needed for wheezing or shortness of breath.    Yes [provider]  amLODipine (NORVASC) 10 MG tablet Take 10 mg by mouth daily.    Yes [provider]  aspirin EC 81 MG tablet Take 81 mg by mouth daily.   Yes [provider]  carvedilol (COREG) 25 MG tablet Take 25 mg by mouth 2 (two) times daily with a meal.   Yes [provider]  cinacalcet (SENSIPAR) 30 MG tablet Take 2 tablets (60 mg total) by mouth daily with supper. 11/03/16  Yes Dorothea Ogle, MD  clindamycin (CLEOCIN) 300 MG capsule Take 300 mg by mouth 3 (three) times daily. 02/03/17 02/13/17 Yes [provider]  insulin glargine (LANTUS) 100 UNIT/ML injection Inject 0.1 mLs (10 Units total) into the skin daily. 01/11/17  Yes Penny Pia, MD  insulin lispro (HUMALOG) 100 UNIT/ML injection Inject 3-10 Units into the skin 3 (three) times daily before meals. Per sliding scale: "BGL 200 = 3 units; 250 = 5 units; 300 = 7 units; 350 = 9-10 units"   Yes [provider]  Insulin Syringe-Needle U-100 (B-D INS SYR HALF-UNIT .3CC/31G) 31G X 5/16" 0.3 ML MISC 1 each as needed. 01/08/17  Yes [provider]  ipratropium-albuterol (DUONEB) 0.5-2.5 (3) MG/3ML SOLN Take 3 mLs by nebulization daily as needed. 01/26/17  Yes [provider]  levETIRAcetam (KEPPRA) 100 MG/ML solution Take 3.8 mLs (380 mg total) by mouth every Tuesday, Thursday, and Saturday at 6 PM. 01/12/17  Yes Penny Pia, MD  levETIRAcetam  (KEPPRA) 100 MG/ML solution Take 7.5 mLs (750 mg total) by mouth daily at 3 pm. 01/10/17  Yes Penny Pia, MD  metoCLOPramide (REGLAN) 5 MG tablet Take 1 tablet (5 mg total) by mouth 4 (four) times daily -  before meals and at bedtime. 10/26/14  Yes Vann, Jessica U, DO  mirtazapine (REMERON) 15 MG tablet Take 15 mg by mouth at bedtime.   Yes [provider]  multivitamin (RENA-VIT) TABS tablet Take 1 tablet by mouth at bedtime. Patient taking differently: Take 1 tablet by mouth daily.  11/03/16  Yes Dorothea Ogle, MD  Nebulizers [facility] ESSENCE NEBULIZER) MISC 1 each as needed. 01/26/17  Yes [provider]  nitroGLYCERIN (NITROSTAT) 0.4 MG SL tablet PLACE 1 TABLET UNDER THE TONGUE EVERY FIVE MINUTES AS NEEDED FOR CHEST PAIN 01/12/17  Yes Bensimhon, Bevelyn Buckles, MD  pantoprazole (PROTONIX) 40 MG tablet Take 1 tablet (40 mg total) by mouth  2 (two) times daily. 06/05/13  Yes Chikowski, Vinnie Langton, MD  ranitidine (ZANTAC) 150 MG tablet Take 150 mg by mouth daily as needed for heartburn.   Yes [provider]  rosuvastatin (CRESTOR) 10 MG tablet Take 1 tablet (10 mg total) by mouth daily. 03/16/16  Yes Comer, Belia Heman, MD  sertraline (ZOLOFT) 50 MG tablet Take 1 tablet (50 mg total) by mouth daily. 01/10/17 01/10/18 Yes Penny Pia, MD  sevelamer (RENAGEL) 800 MG tablet Take 1,600 mg by mouth 3 (three) times daily with meals.   Yes [provider]  traMADol (ULTRAM) 50 MG tablet Take 50 mg by mouth 3 (three) times daily as needed for moderate pain.    Yes [provider]   Current Facility-Administered Medications  Medication Dose Route Frequency Provider Last Rate Last Dose  . 0.9 %  sodium chloride infusion   Intravenous Continuous Gwenyth Bender, NP 10 mL/hr at 02/05/17 1248    . acetaminophen (TYLENOL) tablet 500-1,000 mg  500-1,000 mg Oral Q6H PRN Gwenyth Bender, NP      . albuterol (PROVENTIL) (2.5 MG/3ML) 0.083% nebulizer solution 3 mL  3 mL Inhalation Q6H PRN  Gwenyth Bender, NP   3 mL at 02/05/17 1300  . amLODipine (NORVASC) tablet 10 mg  10 mg Oral Daily Gwenyth Bender, NP   10 mg at 02/05/17 1254  . aspirin EC tablet 81 mg  81 mg Oral Daily Gwenyth Bender, NP   81 mg at 02/05/17 1253  . carvedilol (COREG) tablet 25 mg  25 mg Oral BID WC Gwenyth Bender, NP      . cinacalcet (SENSIPAR) tablet 60 mg  60 mg Oral Q supper Black, Karen M, NP      . clindamycin (CLEOCIN) capsule 300 mg  300 mg Oral TID Toya Smothers M, NP   300 mg at 02/05/17 1254  . famotidine (PEPCID) tablet 10 mg  10 mg Oral Daily Gwenyth Bender, NP   10 mg at 02/05/17 1255  . heparin injection 5,000 Units  5,000 Units Subcutaneous Q8H Gwenyth Bender, NP   5,000 Units at 02/05/17 1410  . insulin aspart (novoLOG) injection 0-9 Units  0-9 Units Subcutaneous TID WC Gwenyth Bender, NP   3 Units at 02/05/17 1257  . ipratropium-albuterol (DUONEB) 0.5-2.5 (3) MG/3ML nebulizer solution 3 mL  3 mL Nebulization Q4H Gwenyth Bender, NP   3 mL at 02/05/17 1410  . [START ON 02/06/2017] levETIRAcetam (KEPPRA) 100 MG/ML solution 380 mg  380 mg Oral Q T,Th,Sat-1800 Black, Karen M, NP      . levETIRAcetam (KEPPRA) 100 MG/ML solution 750 mg  750 mg Oral Q1500 Gwenyth Bender, NP   750 mg at 02/05/17 1412  . metoCLOPramide (REGLAN) tablet 5 mg  5 mg Oral TID AC & HS Gwenyth Bender, NP   5 mg at 02/05/17 1253  . mirtazapine (REMERON) tablet 15 mg  15 mg Oral QHS Black, Karen M, NP      . multivitamin (RENA-VIT) tablet 1 tablet  1 tablet Oral Daily Gwenyth Bender, NP   1 tablet at 02/05/17 1318  . ondansetron (ZOFRAN) tablet 4 mg  4 mg Oral Q6H PRN Gwenyth Bender, NP       Or  . ondansetron [facility]) injection 4 mg  4 mg Intravenous Q6H PRN Black, Karen M, NP      . pantoprazole (PROTONIX) EC tablet 40 mg  40 mg Oral BID  Gwenyth Bender, NP   40 mg at 02/05/17 1255  . rosuvastatin (CRESTOR) tablet 10 mg  10 mg Oral Daily Gwenyth Bender, NP   10 mg at 02/05/17 1300  . sertraline (ZOLOFT) tablet 50 mg  50 mg Oral  Daily Gwenyth Bender, NP   50 mg at 02/05/17 1302  . sevelamer carbonate (RENVELA) tablet 800 mg  800 mg Oral TID WC Delano Metz, MD      . traMADol Janean Sark) tablet 50 mg  50 mg Oral TID PRN Gwenyth Bender, NP       Current Outpatient Prescriptions  Medication Sig Dispense Refill  . acetaminophen (TYLENOL) 500 MG tablet Take 500-1,000 mg by mouth every 6 (six) hours as needed (for pain).     Marland Kitchen albuterol (PROVENTIL HFA;VENTOLIN HFA) 108 (90 BASE) MCG/ACT inhaler Inhale 2 puffs into the lungs every 6 (six) hours as needed for wheezing or shortness of breath.     Marland Kitchen amLODipine (NORVASC) 10 MG tablet Take 10 mg by mouth daily.     Marland Kitchen aspirin EC 81 MG tablet Take 81 mg by mouth daily.    . carvedilol (COREG) 25 MG tablet Take 25 mg by mouth 2 (two) times daily with a meal.    . cinacalcet (SENSIPAR) 30 MG tablet Take 2 tablets (60 mg total) by mouth daily with supper. 60 tablet 0  . clindamycin (CLEOCIN) 300 MG capsule Take 300 mg by mouth 3 (three) times daily.    . insulin glargine (LANTUS) 100 UNIT/ML injection Inject 0.1 mLs (10 Units total) into the skin daily. 10 mL 0  . insulin lispro (HUMALOG) 100 UNIT/ML injection Inject 3-10 Units into the skin 3 (three) times daily before meals. Per sliding scale: "BGL 200 = 3 units; 250 = 5 units; 300 = 7 units; 350 = 9-10 units"    . Insulin Syringe-Needle U-100 (B-D INS SYR HALF-UNIT .3CC/31G) 31G X 5/16" 0.3 ML MISC 1 each as needed.    Marland Kitchen ipratropium-albuterol (DUONEB) 0.5-2.5 (3) MG/3ML SOLN Take 3 mLs by nebulization daily as needed.    . levETIRAcetam (KEPPRA) 100 MG/ML solution Take 3.8 mLs (380 mg total) by mouth every Tuesday, Thursday, and Saturday at 6 PM. 473 mL 0  . levETIRAcetam (KEPPRA) 100 MG/ML solution Take 7.5 mLs (750 mg total) by mouth daily at 3 pm. 473 mL 0  . metoCLOPramide (REGLAN) 5 MG tablet Take 1 tablet (5 mg total) by mouth 4 (four) times daily -  before meals and at bedtime. 90 tablet 0  . mirtazapine (REMERON) 15 MG tablet  Take 15 mg by mouth at bedtime.    . multivitamin (RENA-VIT) TABS tablet Take 1 tablet by mouth at bedtime. (Patient taking differently: Take 1 tablet by mouth daily. ) 30 tablet 0  . Nebulizers (INNOSPIRE ESSENCE NEBULIZER) MISC 1 each as needed.    . nitroGLYCERIN (NITROSTAT) 0.4 MG SL tablet PLACE 1 TABLET UNDER THE TONGUE EVERY FIVE MINUTES AS NEEDED FOR CHEST PAIN 30 tablet 0  . pantoprazole (PROTONIX) 40 MG tablet Take 1 tablet (40 mg total) by mouth 2 (two) times daily. 60 tablet 1  . ranitidine (ZANTAC) 150 MG tablet Take 150 mg by mouth daily as needed for heartburn.    . rosuvastatin (CRESTOR) 10 MG tablet Take 1 tablet (10 mg total) by mouth daily. 30 tablet 11  . sertraline (ZOLOFT) 50 MG tablet Take 1 tablet (50 mg total) by mouth daily. 30 tablet 0  . sevelamer (RENAGEL)  800 MG tablet Take 1,600 mg by mouth 3 (three) times daily with meals.    . traMADol (ULTRAM) 50 MG tablet Take 50 mg by mouth 3 (three) times daily as needed for moderate pain.       ROS: As per HPI otherwise negative.  Physical Exam: Vitals:   02/05/17 1330 02/05/17 1400 02/05/17 1430 02/05/17 1500  BP: (!) 151/73 (!) 144/77 136/72 (!) 144/81  Pulse: 64 67 65 67  Resp: Temp:      TempSrc:      SpO2: 100% 100% 100% 99%  Weight:      Height:         General: WDWN female wearing nasal oxygen NAD  Head: NCAT sclera not icteric MMM Neck: Supple. No JVD No masses Lungs: Raspy breathing, seems working to take deep breaths; Poor air movement, coarse breath sounds  Heart: RRR with S1 S2 Abdomen: soft NT + BS Lower extremities:without edema or ischemic changes, no open wounds  Neuro: A & O  X 3. Moves all extremities spontaneously. Psych:  Responds to questions appropriately with a normal affect. Dialysis Access: LAVF +thrill/bruit   Labs: Basic Metabolic Panel:  Recent Labs Lab 02/05/17 1016  NA 136  K 5.2*  CL 102  CO2 21*  GLUCOSE 257*  BUN 39*  CREATININE 3.69*  CALCIUM 8.0*    Liver Function Tests:  Recent Labs Lab 02/05/17 1016  AST 33  ALT 28  ALKPHOS 119  BILITOT 0.6  PROT 5.2*  ALBUMIN 2.2*   No results for input(s): LIPASE, AMYLASE in the last 168 hours. No results for input(s): AMMONIA in the last 168 hours. CBC:  Recent Labs Lab 02/05/17 1016  WBC 14.7*  NEUTROABS 12.5*  HGB 14.4  HCT 46.8*  MCV 88.8  PLT PLATELET CLUMPS NOTED ON SMEAR, COUNT APPEARS DECREASED   Cardiac Enzymes: No results for input(s): CKTOTAL, CKMB, CKMBINDEX, TROPONINI in the last 168 hours. CBG:  Recent Labs Lab 02/05/17 0822 02/05/17 0959 02/05/17 1122  GLUCAP 74 299* 239*   Iron Studies: No results for input(s): IRON, TIBC, TRANSFERRIN, FERRITIN in the last 72 hours. Studies/Results: Dg Chest Portable 1 View  Result Date: 02/05/2017 CLINICAL DATA:  Cough. EXAM: PORTABLE CHEST 1 VIEW COMPARISON:  01/27/2017 and prior radiographs FINDINGS: Upper limits normal heart size and mild peribronchial thickening again noted. There is no evidence of focal airspace disease, pulmonary edema, suspicious pulmonary nodule/mass, pleural effusion, or pneumothorax. No acute bony abnormalities are identified. IMPRESSION: Mild peribronchial thickening without focal pneumonia. Electronically Signed   By: Harmon Pier M.D.   On: 02/05/2017 09:30    Dialysis Orders:  [facility] TThS 4h 180F BFR 400/A1.5x 3K/2.25 Ca UF Profile 4 EDW 57.5 kg  L AVF Heparin 4000 U Bolus Mircera 225 mcg IV q 2 weeks last dosed 9/20 Venofer  IV q week last dosed 9/27  Hectorol 8 mcg IV q HD    Assessment/Plan: 1.  AMS 2/2 hypoglycemia/hypothermia - improving, now appears at baseline with glucose in 200s and temp 98.22F - per primary  2.  ESRD -  TTS. Cont on schedule. Mild hyperkalemia - correct with HD tomorrow  3.  Hypertension/volume  - Bps elevated and not at EDW. Continue home meds. UF to EDW as tolerated  4.  Anemia  - Hgb 14.4. No ESA needs currently   5.  Metabolic bone disease -  Ca ok.  Check P with renal panel. Most recent OP PTH 63  on 9/27, decreased from 500, would probably lower Hectorol dose, hold sensipar for now.  6.  Nutrition - Renal diet/vitamins/meds/ prostat for low albumin  7. Coarse/raspy breathing following intubation - suspected vocal cord trauma, noted to have OP ENT appt  8. DM - poorly controlled, per primary 9. New onset seizures - on Keppra x 2 months  Ogechi Susann Givens [facility] Pager [phone] 02/05/2017, 3:44 PM     I have seen and examined this patient and agree with plan and assessment in the above note with renal recommendations/intervention highlighted.  Pt feels better now.  Plan for HD tomorrow and continue to follow.   Julien Nordmann Gargi Berch,MD 02/05/2017 5:47 PM

## 2017-02-05 NOTE — ED Notes (Signed)
Pt CBG was 239, notified Ruth(RN)

## 2017-02-06 LAB — GLUCOSE, CAPILLARY
GLUCOSE-CAPILLARY: 170 mg/dL — AB (ref 65–99)
Glucose-Capillary: 173 mg/dL — ABNORMAL HIGH (ref 65–99)

## 2017-02-06 LAB — URINALYSIS, ROUTINE W REFLEX MICROSCOPIC
BILIRUBIN URINE: NEGATIVE
Glucose, UA: >=500 mg/dL — AB
KETONES UR: NEGATIVE mg/dL
Nitrite: NEGATIVE
PROTEIN: 100 mg/dL — AB
Specific Gravity, Urine: 1.011 (ref 1.005–1.030)
pH: 6 (ref 5.0–8.0)

## 2017-02-06 LAB — CBC
HEMATOCRIT: 46.9 % — AB (ref 36.0–46.0)
HEMOGLOBIN: 14.6 g/dL (ref 12.0–15.0)
MCH: 27.5 pg (ref 26.0–34.0)
MCHC: 31.1 g/dL (ref 30.0–36.0)
MCV: 88.3 fL (ref 78.0–100.0)
Platelets: 69 10*3/uL — ABNORMAL LOW (ref 150–400)
RBC: 5.31 MIL/uL — ABNORMAL HIGH (ref 3.87–5.11)
RDW: 19.8 % — AB (ref 11.5–15.5)
WBC: 6.7 10*3/uL (ref 4.0–10.5)

## 2017-02-06 LAB — BASIC METABOLIC PANEL
Anion gap: 14 (ref 5–15)
BUN: 51 mg/dL — ABNORMAL HIGH (ref 6–20)
CHLORIDE: 98 mmol/L — AB (ref 101–111)
CO2: 20 mmol/L — AB (ref 22–32)
Calcium: 7.7 mg/dL — ABNORMAL LOW (ref 8.9–10.3)
Creatinine, Ser: 4.62 mg/dL — ABNORMAL HIGH (ref 0.44–1.00)
GFR calc non Af Amer: 9 mL/min — ABNORMAL LOW (ref >60)
GFR, EST AFRICAN AMERICAN: 11 mL/min — AB (ref >60)
Glucose, Bld: 305 mg/dL — ABNORMAL HIGH (ref 65–99)
POTASSIUM: 7.1 mmol/L — AB (ref 3.5–5.1)
SODIUM: 132 mmol/L — AB (ref 135–145)

## 2017-02-06 LAB — POTASSIUM: POTASSIUM: 6.8 mmol/L — AB (ref 3.5–5.1)

## 2017-02-06 LAB — HIV ANTIBODY (ROUTINE TESTING W REFLEX): HIV SCREEN 4TH GENERATION: NONREACTIVE

## 2017-02-06 LAB — VITAMIN B12: Vitamin B-12: 971 pg/mL — ABNORMAL HIGH (ref 180–914)

## 2017-02-06 MED ORDER — SODIUM CHLORIDE 0.9 % IV SOLN: 100.0000 mL | INTRAVENOUS | Status: DC | PRN

## 2017-02-06 MED ORDER — LIDOCAINE-PRILOCAINE 2.5-2.5 % EX CREA: 1.0000 "application " | TOPICAL_CREAM | CUTANEOUS | Status: DC | PRN

## 2017-02-06 MED ORDER — HEPARIN SODIUM (PORCINE) 1000 UNIT/ML DIALYSIS: 1000.0000 [IU] | INTRAMUSCULAR | Status: DC | PRN

## 2017-02-06 MED ORDER — LIDOCAINE HCL (PF) 1 % IJ SOLN: 5.0000 mL | INTRAMUSCULAR | Status: DC | PRN

## 2017-02-06 MED ORDER — PENTAFLUOROPROP-TETRAFLUOROETH EX AERO: 1.0000 "application " | INHALATION_SPRAY | CUTANEOUS | Status: DC | PRN

## 2017-02-06 MED ORDER — HEPARIN SODIUM (PORCINE) 1000 UNIT/ML DIALYSIS
20.0000 [IU]/kg | INTRAMUSCULAR | Status: DC | PRN
  Administered 2017-02-06: 1200 [IU] via INTRAVENOUS_CENTRAL
  Filled 2017-02-06: qty 2

## 2017-02-06 NOTE — Discharge Summary (Signed)
Patient requested discharge. I have indicated that her K is too high. Despite our discussion patient preferred to leave against medical advice.  Brinsley Wence, [employer]

## 2017-02-06 NOTE — Procedures (Signed)
I was present at this dialysis session. I have reviewed the session itself and made appropriate changes.   Filed Weights   02/05/17 1700 02/06/17 0442 02/06/17 0700  Weight: 59.4 kg (131 lb) 64.7 kg (142 lb 9.6 oz) 62.3 kg (137 lb 5.6 oz)     Recent Labs Lab 02/06/17 0602 02/06/17 0715  NA 132*  --   K 7.1* 6.8*  CL 98*  --   CO2 20*  --   GLUCOSE 305*  --   BUN 51*  --   CREATININE 4.62*  --   CALCIUM 7.7*  --      Recent Labs Lab 02/05/17 1016 02/06/17 0602  WBC 14.7* 6.7  NEUTROABS 12.5*  --   HGB 14.4 14.6  HCT 46.8* 46.9*  MCV 88.8 88.3  PLT PLATELET CLUMPS NOTED ON SMEAR, COUNT APPEARS DECREASED 69*    Scheduled Meds: . amLODipine  10 mg Oral Daily  . aspirin EC  81 mg Oral Daily  . carvedilol  25 mg Oral BID WC  . clindamycin  300 mg Oral TID  . famotidine  10 mg Oral Daily  . heparin  5,000 Units Subcutaneous Q8H  . insulin aspart  0-9 Units Subcutaneous TID WC  . ipratropium-albuterol  3 mL Nebulization Q4H  . levETIRAcetam  380 mg Oral Q T,Th,Sat-1800  . levETIRAcetam  750 mg Oral Q1500  . metoCLOPramide  5 mg Oral TID AC & HS  . mirtazapine  15 mg Oral QHS  . multivitamin  1 tablet Oral Daily  . pantoprazole  40 mg Oral BID  . rosuvastatin  10 mg Oral Daily  . sertraline  50 mg Oral Daily  . sevelamer carbonate  800 mg Oral TID WC   Continuous Infusions: . sodium chloride    . sodium chloride     PRN Meds:.sodium chloride, sodium chloride, acetaminophen, albuterol, heparin, heparin, lidocaine (PF), lidocaine-prilocaine, ondansetron **OR** ondansetron (ZOFRAN) IV, pentafluoroprop-tetrafluoroeth, traMADol    Dialysis Orders:  [facility] T[facility] 4h 180F BFR 400/A1.5x 3K/2.25 Ca UF Profile 4 EDW 57.5 kg  L AVF Heparin 4000 U Bolus Mircera 225 mcg IV q 2 weeks last dosed 9/20 Venofer  IV q week last dosed 9/27  Hectorol 8 mcg IV q HD    Assessment/Plan: 1. Hyperkalemia- will use 1 K bath for first 30 minutes with HD and follow post HD.  2. AMS  2/2 hypoglycemia/hypothermia - improving, now appears at baseline with glucose in 300s and temp 97.18F - per primary  3.  ESRD -  TTS. Cont on schedule.  4.  Hypertension/volume  - Bps elevated and almost 10kg above EDW. Continue home meds. UF to EDW as tolerated  5.  Anemia  - Hgb 14's. No ESA needs currently   6.  Metabolic bone disease -  Ca ok. Check P with renal panel. Most recent OP PTH 63  on 9/27, decreased from 500, would probably lower Hectorol dose, hold sensipar for now.  7.  Nutrition - Renal diet/vitamins/meds/ prostat for low albumin  8. 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 DM - poorly controlled, per primary 10. 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

## 2017-02-06 NOTE — Progress Notes (Signed)
K+ of 7.1 reported by lab to Charge Nurse. To report value to Dr. Arrie Aran.

## 2017-02-06 NOTE — Progress Notes (Signed)
Requests to be discharged, however, her K+ is too high for MD to discharge. Pt and daughter informed and verbalized understanding. Pt and daughter are in agreement to leave AMA. AMA form signed by pt. Will d/c to front lobby when daughter arrives.

## 2017-02-10 ENCOUNTER — Other Ambulatory Visit (HOSPITAL_COMMUNITY): Payer: Self-pay | Admitting: Internal Medicine

## 2017-03-01 ENCOUNTER — Encounter: Payer: Self-pay | Admitting: Diagnostic Neuroimaging

## 2017-03-01 ENCOUNTER — Ambulatory Visit (INDEPENDENT_AMBULATORY_CARE_PROVIDER_SITE_OTHER): Payer: Medicaid Other | Admitting: Diagnostic Neuroimaging

## 2017-03-01 VITALS — BP 108/62 | HR 55 | Ht <= 58 in | Wt 130.0 lb

## 2017-03-01 DIAGNOSIS — Z992 Dependence on renal dialysis: Secondary | ICD-10-CM | POA: Diagnosis not present

## 2017-03-01 DIAGNOSIS — I10 Essential (primary) hypertension: Secondary | ICD-10-CM | POA: Diagnosis not present

## 2017-03-01 DIAGNOSIS — N186 End stage renal disease: Secondary | ICD-10-CM | POA: Diagnosis not present

## 2017-03-01 DIAGNOSIS — R569 Unspecified convulsions: Secondary | ICD-10-CM

## 2017-03-01 MED ORDER — LEVETIRACETAM 100 MG/ML PO SOLN: 750.0000 mg | Freq: Every day | ORAL | 12 refills | Status: AC

## 2017-03-01 MED ORDER — LEVETIRACETAM 100 MG/ML PO SOLN: 380.0000 mg | ORAL | 12 refills | Status: DC

## 2017-03-01 MED ORDER — LEVETIRACETAM 100 MG/ML PO SOLN: 750.0000 mg | Freq: Every day | ORAL | 12 refills | Status: DC

## 2017-03-01 MED ORDER — LEVETIRACETAM 100 MG/ML PO SOLN: 380.0000 mg | ORAL | 12 refills | Status: AC

## 2017-03-01 NOTE — Progress Notes (Signed)
[facility]  PATIENT: Madeline [name] DOB: 1955-10-11  REFERRING CLINICIAN: Hospital follow up HISTORY FROM: patient and daughter and chart review REASON FOR VISIT: new consult / exisiting patient   HISTORICAL  CHIEF COMPLAINT:  Chief Complaint  Patient presents with  . Seizures    rm 7, new problem, dgtr- Byrd Hesselbach, "seizure Aug 2018 witnessed by brother; mother had seizures"    HISTORY OF PRESENT ILLNESS:   NEW HPI 03/01/17: 61 year old female here for evaluation of seizures. Patient has history of end-stage renal disease on hemodialysis, hypertension, diabetes, hypercholesteremia, coronary artery disease, CHF, COPD, OSA, pulmonary hypertension, pancreatitis.  10/30/16 --> sugar 900, hypertension, uremia, possible GI bleed --> possible seizure; no anti seizure meds.  01/05/17 --> multiple seizures at home; sugar 772 and BP 260/120; admitted and d/c'd on keppra.  02/05/17 --> confusion and low sugar (51); admitted for eval; no def seizures.  Since then, patient is stable. No further seizures. Tolerating meds.    PRIOR HPI (12/18/14): 61 year old female here with hypertension, diabetes, hypercholesterolemia, coronary artery disease, CHF, COPD, obstructive sleep apnea, pulmonary hypertension, here for evaluation of tremors.  May 2016 patient had onset of intermittent muscle jerks and spasms, as well as intermittent loss of muscle tone, dropping things, falling down. Patient was admitted to the hospital June 1-7, 2016 (diagnosed with toxic metabolic encephalopathy, abdominal pain, hospital pancreatitis, hyperglycemia, fever and hypotension; hospital records reviewed and summarized). During that hospital stay patient was also noted to have continuation of muscle spasms inject. Neurology consult was obtained. Patient was diagnosed with asterixis related to toxic metabolic etiologies. Patient was treated with levetiracetam during hospital course and tremors seem to  resolve.  Patient was readmitted June 13-17, 2016 (Diagnosed with acute encephalopathy, nausea, vomiting, gastroparesis, elevated blood sugars up to 800). Abnormal movements and tremors were not noted on this admission. After discharge patient went home and has been doing better. Tremors have essentially resolved. She still has some intermittent pain in her legs. Levetiracetam was discontinued.   REVIEW OF SYSTEMS: Full 14 system review of systems performed and negative with exception of: Insomnia snoring memory loss confusion numbness weakness seizure joint pain runny nose shortness of breath blurred vision ringing ears.  ALLERGIES: No Known Allergies  HOME MEDICATIONS: Outpatient Medications Prior to Visit  Medication Sig Dispense Refill  . acetaminophen (TYLENOL) 500 MG tablet Take 500-1,000 mg by mouth every 6 (six) hours as needed (for pain).     Marland Kitchen albuterol (PROVENTIL HFA;VENTOLIN HFA) 108 (90 BASE) MCG/ACT inhaler Inhale 2 puffs into the lungs every 6 (six) hours as needed for wheezing or shortness of breath.     Marland Kitchen amLODipine (NORVASC) 10 MG tablet Take 10 mg by mouth daily.     Marland Kitchen aspirin EC 81 MG tablet Take 81 mg by mouth daily.    . carvedilol (COREG) 25 MG tablet Take 25 mg by mouth 2 (two) times daily with a meal.    . cinacalcet (SENSIPAR) 30 MG tablet Take 2 tablets (60 mg total) by mouth daily with supper. 60 tablet 0  . insulin glargine (LANTUS) 100 UNIT/ML injection Inject 0.1 mLs (10 Units total) into the skin daily. 10 mL 0  . insulin lispro (HUMALOG) 100 UNIT/ML injection Inject 3-10 Units into the skin 3 (three) times daily before meals. Per sliding scale: "BGL 200 = 3 units; 250 = 5 units; 300 = 7 units; 350 = 9-10 units"    . Insulin Syringe-Needle U-100 (B-D INS SYR HALF-UNIT .3CC/31G) 31G X  5/16" 0.3 ML MISC 1 each as needed.    Marland Kitchen ipratropium-albuterol (DUONEB) 0.5-2.5 (3) MG/3ML SOLN Take 3 mLs by nebulization daily as needed.    . levETIRAcetam (KEPPRA) 100 MG/ML  solution Take 3.8 mLs (380 mg total) by mouth every Tuesday, Thursday, and Saturday at 6 PM. 473 mL 0  . levETIRAcetam (KEPPRA) 100 MG/ML solution Take 7.5 mLs (750 mg total) by mouth daily at 3 pm. 473 mL 0  . metoCLOPramide (REGLAN) 5 MG tablet Take 1 tablet (5 mg total) by mouth 4 (four) times daily -  before meals and at bedtime. 90 tablet 0  . mirtazapine (REMERON) 15 MG tablet Take 15 mg by mouth at bedtime.    . multivitamin (RENA-VIT) TABS tablet Take 1 tablet by mouth at bedtime. (Patient taking differently: Take 1 tablet by mouth daily. ) 30 tablet 0  . Nebulizers (INNOSPIRE ESSENCE NEBULIZER) MISC 1 each as needed.    . nitroGLYCERIN (NITROSTAT) 0.4 MG SL tablet PLACE 1 TABLET UNDER THE TONGUE EVERY FIVE MINUTES AS NEEDED FOR CHEST PAIN 30 tablet 0  . nitroGLYCERIN (NITROSTAT) 0.4 MG SL tablet PLACE 1 TABLET UNDER THE TONGUE EVERY FIVE MINUTES AS NEEDED FOR CHEST PAIN 30 tablet 0  . pantoprazole (PROTONIX) 40 MG tablet Take 1 tablet (40 mg total) by mouth 2 (two) times daily. 60 tablet 1  . ranitidine (ZANTAC) 150 MG tablet Take 150 mg by mouth daily as needed for heartburn.    . rosuvastatin (CRESTOR) 10 MG tablet Take 1 tablet (10 mg total) by mouth daily. 30 tablet 11  . sertraline (ZOLOFT) 50 MG tablet Take 1 tablet (50 mg total) by mouth daily. 30 tablet 0  . sevelamer (RENAGEL) 800 MG tablet Take 1,600 mg by mouth 3 (three) times daily with meals.    . traMADol (ULTRAM) 50 MG tablet Take 50 mg by mouth 3 (three) times daily as needed for moderate pain.      No facility-administered medications prior to visit.     PAST MEDICAL HISTORY: Past Medical History:  Diagnosis Date  . Abnormal Doppler ultrasound of carotid artery    a. Per [place] records: <50% LICA.  Marland Kitchen Anasarca    a. Per [place] records - due to pulm HTN with R HF.   Marland Kitchen Aseptic meningitis    a. 09/2012: adm in [place] for metabolic encephalopathy, oliguric tubular necrosis, anemia, HTN, possible CVA, HHNKA.  Marland Kitchen CHF (congestive heart  failure) (HCC)    a. HFpEF with RHF/anasarca/pHTN.  . CKD (chronic kidney disease), stage III (HCC)    a. Per [place] records: h/o ARF after CTA that ruled out PE.  Marland Kitchen COPD (chronic obstructive pulmonary disease) (HCC)   . Coronary artery disease    a. Per [place] records: NSTEMI 01/2012, tx medically given ARF but suspected CAD. b. Stress test 12/16/11 reported w/o ischemia.  Marland Kitchen CVA (cerebral infarction)    a. Per [place] records, "possible CVA" 09/2012 but MRI reportedly negative.  . Diabetes mellitus (HCC)   . Dialysis patient [facility])    03/01/17 Tues, Thurs, Sat  . Hypertension   . OSA (obstructive sleep apnea)    a. Pt reported used to use CPAP in [place], but "ran out" when came to [facility].  Marland Kitchen Pulmonary hypertension (HCC)    a. [facility] 02/28/13: mod pulm HTN with normal PVR suggestive of predominantly pulmonary venous HTN.  Marland Kitchen Renal insufficiency    dialysis 3 days/week  . Right heart failure (HCC)   . Seizures (HCC)  12/2016  . Stroke [facility]) 2014    PAST SURGICAL HISTORY: Past Surgical History:  Procedure Laterality Date  . ABDOMINAL HYSTERECTOMY    . AV FISTULA PLACEMENT Left 03/03/2013   Procedure: ARTERIOVENOUS (AV) FISTULA CREATION, Brachial/Cephalic;  Surgeon: Larina Earthly, MD;  Location: [facility] OR;  Service: Vascular;  Laterality: Left;  . ESOPHAGOGASTRODUODENOSCOPY N/A 02/16/2013   Procedure: ESOPHAGOGASTRODUODENOSCOPY (EGD);  Surgeon: Graylin Shiver, MD;  Location: [facility] ENDOSCOPY;  Service: Endoscopy;  Laterality: N/A;  . INSERTION OF DIALYSIS CATHETER Right 03/03/2013   Procedure: INSERTION OF DIALYSIS CATHETER;  Surgeon: Larina Earthly, MD;  Location: [facility] OR;  Service: Vascular;  Laterality: Right;  Right Internal Jugular Placement  . RIGHT HEART CATHETERIZATION N/A 02/28/2013   Procedure: RIGHT HEART CATH;  Surgeon: Dolores Patty, MD;  Location: [facility] CATH LAB;  Service: Cardiovascular;  Laterality: N/A;  . SHUNTOGRAM N/A 07/13/2013   Procedure: FISTULOGRAM;  Surgeon: Fransisco Hertz, MD;  Location: [facility] CATH LAB;   Service: Cardiovascular;  Laterality: N/A;  . TUBAL LIGATION      FAMILY HISTORY: Family History  Problem Relation Age of Onset  . Seizures Mother   . Diabetes Mellitus II Sister   . Diabetes Mellitus II Brother   . CAD Brother     SOCIAL HISTORY:  Social History   Social History  . Marital status: Widowed    Spouse name: N/A  . Number of children: 4  . Years of education: 9   Occupational History  .      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 Smoking status: Former Games developer  . Smokeless tobacco: Current User    Types: Snuff  . Alcohol use No  . Drug use: No  . Sexual activity: No   Other Topics Concern  . Not on file   Social History Narrative   03/01/17 Lives at home with brother   Caffeine use- none     PHYSICAL EXAM  GENERAL EXAM/CONSTITUTIONAL: Vitals:  Vitals:   03/01/17 0903  BP: 108/62  Pulse: (!) 55  Weight: 130 lb (59 kg)  Height: 4\' 9"  (1.448 m)     Body mass index is 28.13 kg/m.  No exam data present  Patient is in no distress; well developed, nourished and groomed; neck is supple  CARDIOVASCULAR:  Examination of carotid arteries is normal; no carotid bruits  Regular rate and rhythm, no murmurs  Examination of peripheral vascular system by observation and palpation is normal  EYES:  Ophthalmoscopic exam of optic discs and posterior segments is normal; no papilledema or hemorrhages  MUSCULOSKELETAL:  Gait, strength, tone, movements noted in Neurologic exam below  NEUROLOGIC: MENTAL STATUS:  No flowsheet data found.  awake, alert, oriented to person, place and time  recent and remote memory intact  normal attention and concentration  language fluent, comprehension intact, naming intact,   fund of knowledge appropriate  CRANIAL NERVE:   2nd - no papilledema on fundoscopic exam  2nd, 3rd, 4th, 6th - pupils equal and reactive to light, visual fields full to confrontation, extraocular muscles  intact, no nystagmus  5th - facial sensation symmetric  7th - facial strength symmetric  8th - hearing intact  9th - palate elevates symmetrically, uvula midline  11th - shoulder shrug symmetric  12th - tongue protrusion midline --> DISCOLORATION ON TONGUE DUE TO CHEWING TOBACCO  MOTOR:   normal bulk and tone, DIFFUSE 4/5 strength in the BUE, BLE  SENSORY:   normal and  symmetric to light touch, temperature, vibration; EXCEPT DECT IN LEFT HAND TO ALL MODALITIES  COORDINATION:   finger-nose-finger, fine finger movements SLOW  REFLEXES:   deep tendon reflexes TRACE and symmetric  GAIT/STATION:   narrow based gait; USES ROLLATOR WALKER    DIAGNOSTIC DATA (LABS, IMAGING, TESTING) - I reviewed patient records, labs, notes, testing and imaging myself where available.  Lab Results  Component Value Date   WBC 6.7 02/06/2017   HGB 14.6 02/06/2017   HCT 46.9 (H) 02/06/2017   MCV 88.3 02/06/2017   PLT 69 (L) 02/06/2017      Component Value Date/Time   NA 132 (L) 02/06/2017 0602   K 6.8 (HH) 02/06/2017 0715   CL 98 (L) 02/06/2017 0602   CO2 20 (L) 02/06/2017 0602   GLUCOSE 305 (H) 02/06/2017 0602   BUN 51 (H) 02/06/2017 0602   CREATININE 4.62 (H) 02/06/2017 0602   CREATININE 3.12 (H) 02/04/2016 1340   CALCIUM 7.7 (L) 02/06/2017 0602   CALCIUM 8.6 12/12/2013 0920   PROT 5.2 (L) 02/05/2017 1016   ALBUMIN 2.2 (L) 02/05/2017 1016   AST 33 02/05/2017 1016   ALT 28 02/05/2017 1016   ALT 18 02/04/2016 1340   ALKPHOS 119 02/05/2017 1016   BILITOT 0.6 02/05/2017 1016   GFRNONAA 9 (L) 02/06/2017 0602   GFRAA 11 (L) 02/06/2017 0602   Lab Results  Component Value Date   CHOL 173 02/08/2016   HDL 71 02/08/2016   LDLCALC 83 02/08/2016   TRIG 672 (H) 01/05/2017   CHOLHDL 2.4 02/08/2016   Lab Results  Component Value Date   HGBA1C 9.9 (H) 02/05/2017   Lab Results  Component Value Date   VITAMINB12 971 (H) 02/06/2017   Lab Results  Component Value Date   TSH  0.732 02/08/2016    10/30/16 EEG - EEG Abnormalities: 1) generalized irregular slow activity - Clinical Interpretation: This EEG is consistent with a moderate generalized nonspecific cerebral dysfunction (encephalopathy). Please note that a normal EEG does not preclude the possibility of epilepsy.   01/06/17 MRI brain  1. No acute intracranial abnormality. No specific findings for PRES or hyperosmolar hyperglycemia. No MRI findings of encephalitis or seizure related activity. 2. Stable mild chronic microvascular ischemic changes and parenchymal volume loss of the brain.      ASSESSMENT AND PLAN  61 y.o. year old female here with end-stage renal disease on hemodialysis, uncontrolled hypertension, uncontrolled diabetes, with multiple seizures. These could have been triggered by hyperglycemia and hypertension. Patient now on antiseizure medication and stable.  Dx:  1. Seizure (HCC)   2. End stage renal disease on dialysis (HCC)   3. Essential hypertension     PLAN: - continue levetiracetam  Take 7.5 mLs (750 mg total) by mouth every day at 3 pm.  Take additional 3.8 mLs (380 mg total) by mouth every Tuesday, Thursday, and Saturday at 6 PM.  Meds ordered this encounter  Medications  . DISCONTD: levETIRAcetam (KEPPRA) 100 MG/ML solution    Sig: Take 3.8 mLs (380 mg total) by mouth every Tuesday, Thursday, and Saturday at 6 PM.    Dispense:  473 mL    Refill:  12  . DISCONTD: levETIRAcetam (KEPPRA) 100 MG/ML solution    Sig: Take 7.5 mLs (750 mg total) by mouth daily at 3 pm.    Dispense:  473 mL    Refill:  12  . levETIRAcetam (KEPPRA) 100 MG/ML solution    Sig: Take 3.8 mLs (380 mg total) by  mouth every Tuesday, Thursday, and Saturday at 6 PM.    Dispense:  473 mL    Refill:  12  . levETIRAcetam (KEPPRA) 100 MG/ML solution    Sig: Take 7.5 mLs (750 mg total) by mouth daily at 3 pm.    Dispense:  473 mL    Refill:  12   Return in about 4 months (around 07/02/2017) for with  NP.    Suanne MarkerVIKRAM R. Shemeca Lukasik, MD 03/01/2017, 9:33 AM Certified in Neurology, Neurophysiology and Neuroimaging  [facility] [address], [address] [place], [place] [address] [phone]

## 2017-04-14 ENCOUNTER — Other Ambulatory Visit: Payer: Self-pay | Admitting: Internal Medicine

## 2017-04-26 ENCOUNTER — Other Ambulatory Visit: Payer: Self-pay | Admitting: Internal Medicine

## 2017-07-01 ENCOUNTER — Telehealth: Payer: Self-pay | Admitting: Diagnostic Neuroimaging

## 2017-07-01 NOTE — Telephone Encounter (Signed)
Pt daughter called to inform office that on 06-09-2017 pt passed away.

## 2017-07-05 ENCOUNTER — Ambulatory Visit: Payer: Medicaid Other | Admitting: Diagnostic Neuroimaging

## 2019-01-06 IMAGING — CT CT HEAD W/O CM
4 series · 15 of 47 positions shown, 17 images · non-contrast
Comparison: October 29, 2016

CLINICAL DATA: Seizure with headache

EXAM:
CT HEAD WITHOUT CONTRAST
TECHNIQUE: Contiguous axial images were obtained from the base of the skull
through the vertex without intravenous contrast.

[Series 3: head bone · axial · 0.42mm/px · z∈[-98,-84]mm · 2 of 69 slices shown]
[im 7/69  bone]
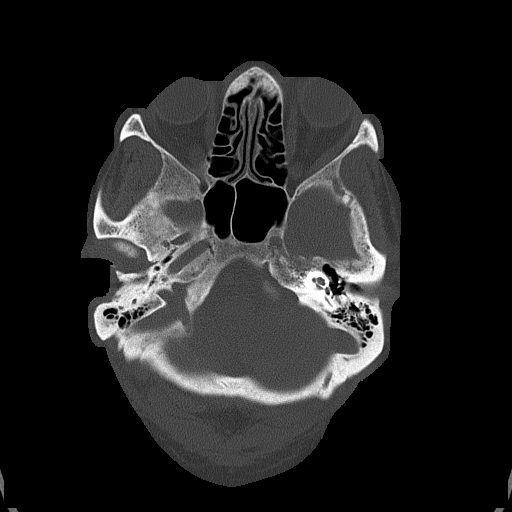
[im 14/69  bone]
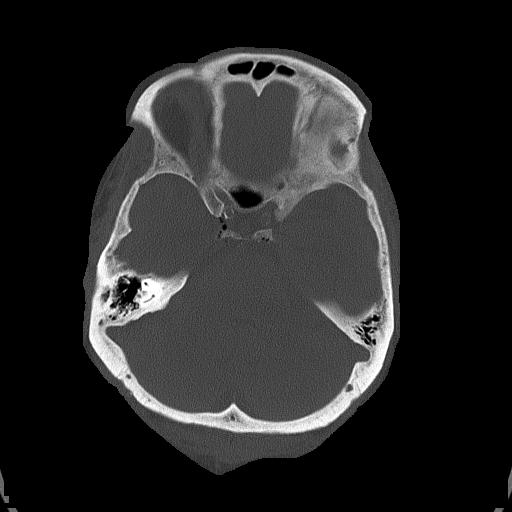

[Series 4: head without · axial · non-contrast · 0.42mm/px · z∈[-95,+5]mm · 7 of 28 slices shown, 9 images]
[im 4/28  brain]
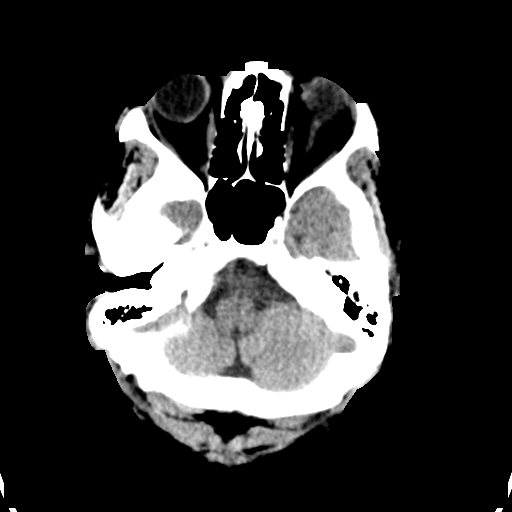
[im 4/28  bone]
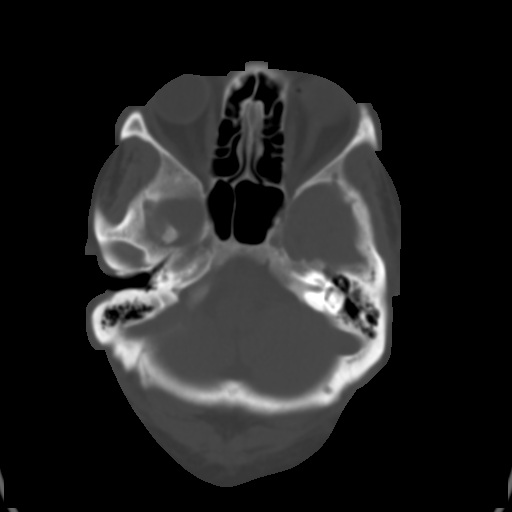
[im 7/28  brain]
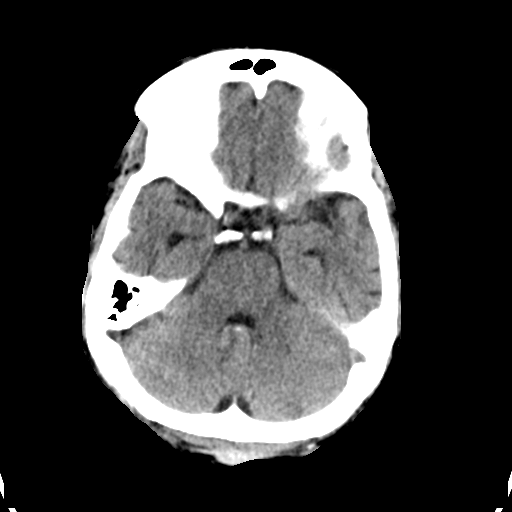
[im 11/28  brain]
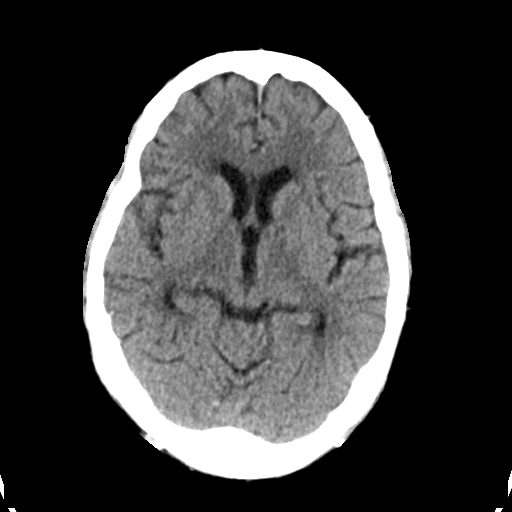
[im 14/28  brain]
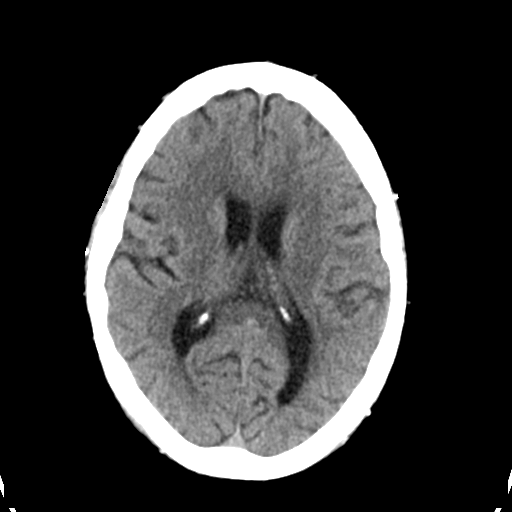
[im 17/28  brain]
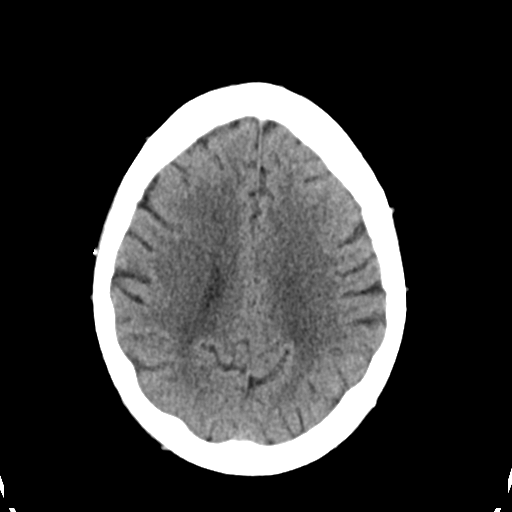
[im 17/28  bone]
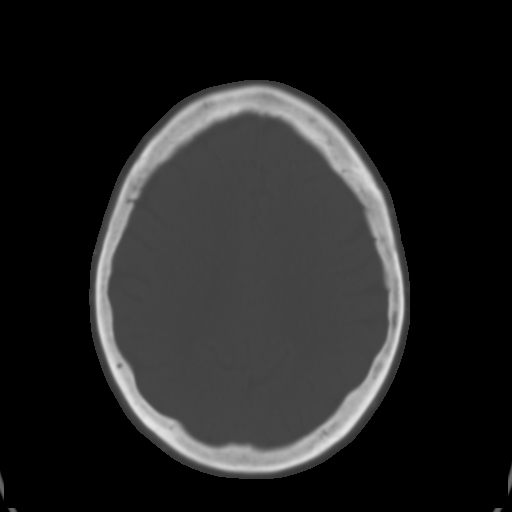
[im 21/28  brain]
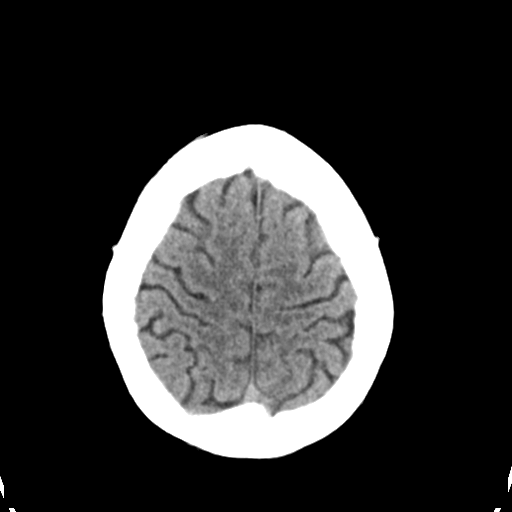
[im 24/28  brain]
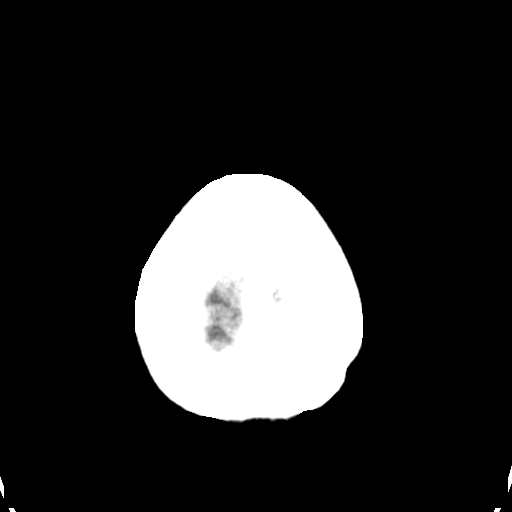

[Series 5: head without cor · coronal · non-contrast · 0.29mm/px · 3 of 61 slices shown]
[im 21/61  brain]
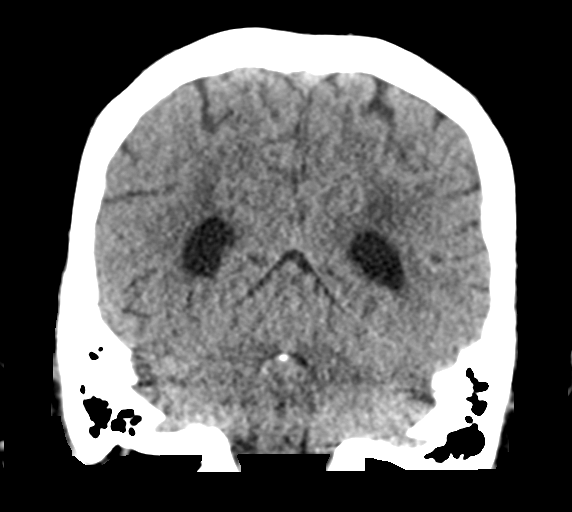
[im 27/61  brain]
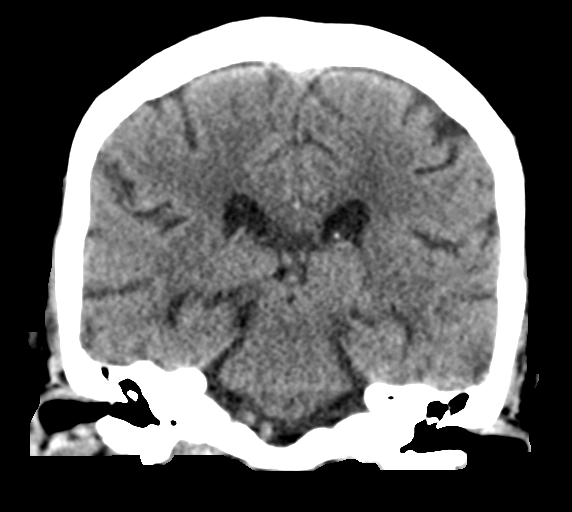
[im 34/61  brain]
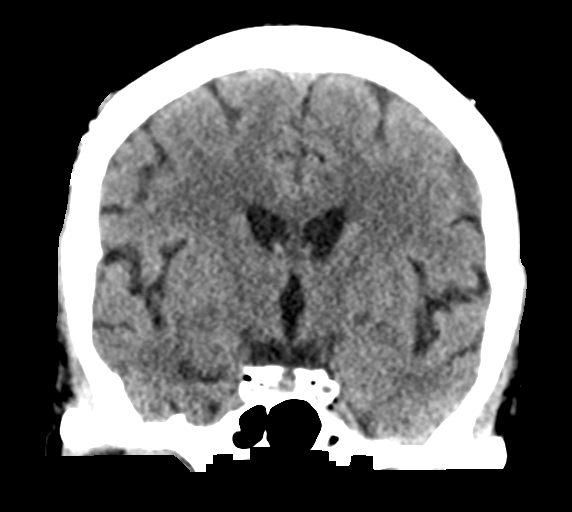

[Series 6: head without sag · sagittal · non-contrast · 0.27mm/px · 3 of 48 slices shown]
[im 16/48  brain]
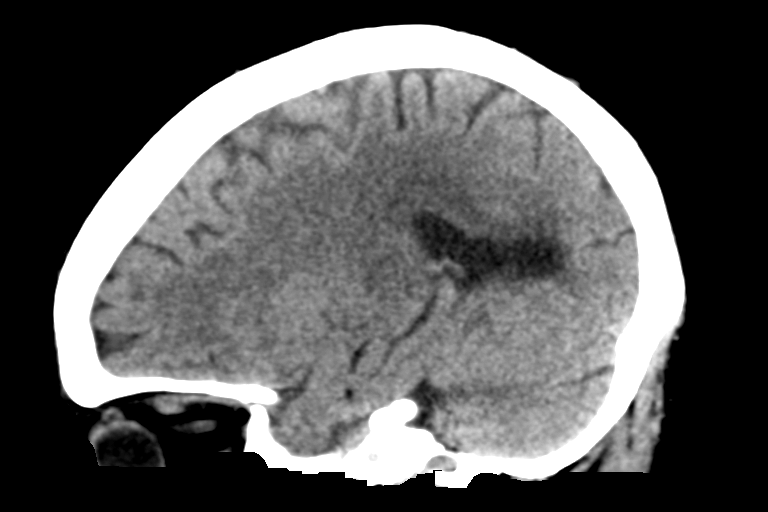
[im 24/48  brain]
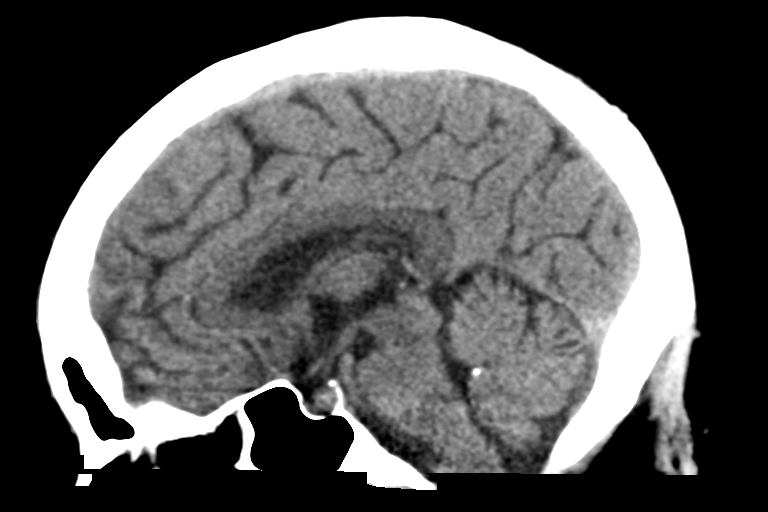
[im 32/48  brain]
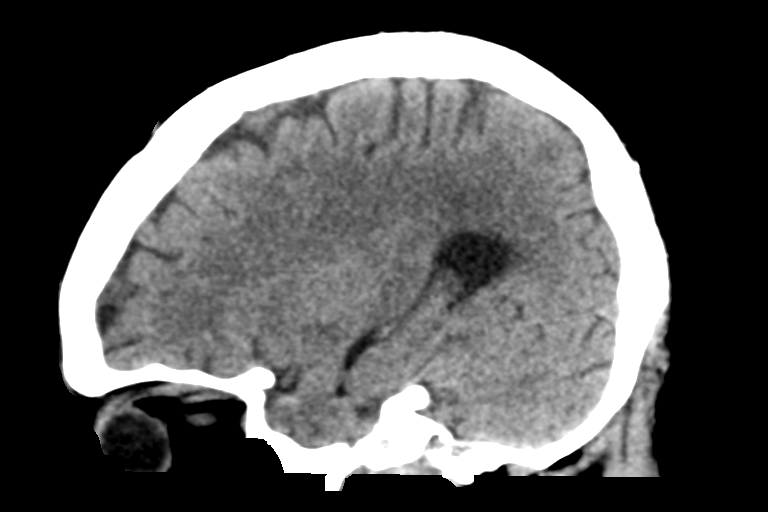

[15 of 47 positions shown; findings below may reference images not displayed]

FINDINGS: Brain: The ventricles are normal in size and configuration. There is
no intracranial mass, hemorrhage, extra-axial fluid collection, or
midline shift. There is mild patchy small vessel disease in the
centra semiovale bilaterally. Elsewhere gray-white compartments
appear normal. There is no evident acute infarct.

Vascular: No hyperdense vessel. There is calcification in each
carotid siphon region. There is also calcification in each distal
vertebral artery.

Skull: Bony calvarium appears intact.

Sinuses/Orbits: There is mucosal thickening in several ethmoid air
cells bilaterally. Other visualized paranasal sinuses are clear.
Visualized orbits appear symmetric bilaterally except for
questionable cataract removal on the right.

Other: Mastoid air cells are clear.
IMPRESSION: Patchy periventricular small vessel disease, essentially stable. No
intracranial mass, hemorrhage, or extra-axial fluid collection. No
evident acute infarct.

Multiple foci of arterial vascular calcification noted. Mucosal
thickening in several ethmoid air cells bilaterally.
